# Patient Record
Sex: Male | Born: 1965 | Race: Black or African American | Hispanic: No | Marital: Single | Smoking: Former smoker
Health system: Southern US, Community
[De-identification: ages and names within clinical notes are randomized; demographics above are authoritative.]

## PROBLEM LIST (undated history)

## (undated) DIAGNOSIS — B370 Candidal stomatitis: Secondary | ICD-10-CM

## (undated) DIAGNOSIS — L988 Other specified disorders of the skin and subcutaneous tissue: Secondary | ICD-10-CM

## (undated) DIAGNOSIS — I10 Essential (primary) hypertension: Secondary | ICD-10-CM

## (undated) DIAGNOSIS — K219 Gastro-esophageal reflux disease without esophagitis: Secondary | ICD-10-CM

## (undated) DIAGNOSIS — L0292 Furuncle, unspecified: Secondary | ICD-10-CM

## (undated) DIAGNOSIS — Z21 Asymptomatic human immunodeficiency virus [HIV] infection status: Secondary | ICD-10-CM

## (undated) DIAGNOSIS — F419 Anxiety disorder, unspecified: Secondary | ICD-10-CM

## (undated) DIAGNOSIS — K220 Achalasia of cardia: Secondary | ICD-10-CM

## (undated) DIAGNOSIS — I2510 Atherosclerotic heart disease of native coronary artery without angina pectoris: Secondary | ICD-10-CM

## (undated) DIAGNOSIS — K8020 Calculus of gallbladder without cholecystitis without obstruction: Secondary | ICD-10-CM

## (undated) DIAGNOSIS — B20 Human immunodeficiency virus [HIV] disease: Secondary | ICD-10-CM

## (undated) DIAGNOSIS — L409 Psoriasis, unspecified: Secondary | ICD-10-CM

## (undated) DIAGNOSIS — B001 Herpesviral vesicular dermatitis: Secondary | ICD-10-CM

## (undated) DIAGNOSIS — C44529 Squamous cell carcinoma of skin of other part of trunk: Secondary | ICD-10-CM

## (undated) DIAGNOSIS — G4730 Sleep apnea, unspecified: Secondary | ICD-10-CM

## (undated) DIAGNOSIS — F1910 Other psychoactive substance abuse, uncomplicated: Secondary | ICD-10-CM

## (undated) DIAGNOSIS — M7500 Adhesive capsulitis of unspecified shoulder: Secondary | ICD-10-CM

## (undated) DIAGNOSIS — M1990 Unspecified osteoarthritis, unspecified site: Secondary | ICD-10-CM

## (undated) DIAGNOSIS — K759 Inflammatory liver disease, unspecified: Secondary | ICD-10-CM

## (undated) DIAGNOSIS — S42309A Unspecified fracture of shaft of humerus, unspecified arm, initial encounter for closed fracture: Secondary | ICD-10-CM

## (undated) DIAGNOSIS — F1410 Cocaine abuse, uncomplicated: Secondary | ICD-10-CM

## (undated) DIAGNOSIS — T7840XA Allergy, unspecified, initial encounter: Secondary | ICD-10-CM

## (undated) DIAGNOSIS — D0761 Carcinoma in situ of scrotum: Secondary | ICD-10-CM

## (undated) DIAGNOSIS — A630 Anogenital (venereal) warts: Secondary | ICD-10-CM

## (undated) DIAGNOSIS — I219 Acute myocardial infarction, unspecified: Secondary | ICD-10-CM

## (undated) DIAGNOSIS — B379 Candidiasis, unspecified: Secondary | ICD-10-CM

## (undated) HISTORY — PX: COLONOSCOPY: SHX174

## (undated) HISTORY — DX: Inflammatory liver disease, unspecified: K75.9

## (undated) HISTORY — DX: Psoriasis, unspecified: L40.9

## (undated) HISTORY — DX: Other psychoactive substance abuse, uncomplicated: F19.10

## (undated) HISTORY — DX: Furuncle, unspecified: L02.92

## (undated) HISTORY — DX: Acute myocardial infarction, unspecified: I21.9

## (undated) HISTORY — DX: Candidal stomatitis: B37.0

## (undated) HISTORY — DX: Other specified disorders of the skin and subcutaneous tissue: L98.8

## (undated) HISTORY — DX: Allergy, unspecified, initial encounter: T78.40XA

## (undated) HISTORY — DX: Asymptomatic human immunodeficiency virus (hiv) infection status: Z21

## (undated) HISTORY — DX: Gastro-esophageal reflux disease without esophagitis: K21.9

## (undated) HISTORY — DX: Candidiasis, unspecified: B37.9

## (undated) HISTORY — DX: Squamous cell carcinoma of skin of other part of trunk: C44.529

## (undated) HISTORY — DX: Unspecified fracture of shaft of humerus, unspecified arm, initial encounter for closed fracture: S42.309A

## (undated) HISTORY — DX: Adhesive capsulitis of unspecified shoulder: M75.00

## (undated) HISTORY — DX: Unspecified osteoarthritis, unspecified site: M19.90

## (undated) HISTORY — DX: Achalasia of cardia: K22.0

## (undated) HISTORY — DX: Anogenital (venereal) warts: A63.0

## (undated) HISTORY — DX: Human immunodeficiency virus (HIV) disease: B20

## (undated) HISTORY — PX: UPPER GASTROINTESTINAL ENDOSCOPY: SHX188

## (undated) HISTORY — DX: Herpesviral vesicular dermatitis: B00.1

## (undated) HISTORY — DX: Carcinoma in situ of scrotum: D07.61

## (undated) HISTORY — DX: Atherosclerotic heart disease of native coronary artery without angina pectoris: I25.10

---

## 1991-10-26 DIAGNOSIS — K759 Inflammatory liver disease, unspecified: Secondary | ICD-10-CM

## 1991-10-26 HISTORY — DX: Inflammatory liver disease, unspecified: K75.9

## 2004-04-22 ENCOUNTER — Emergency Department (HOSPITAL_COMMUNITY): Admission: EM | Admit: 2004-04-22 | Discharge: 2004-04-22 | Payer: Self-pay | Admitting: Family Medicine

## 2004-07-04 ENCOUNTER — Ambulatory Visit: Payer: Self-pay | Admitting: Infectious Diseases

## 2004-07-04 ENCOUNTER — Inpatient Hospital Stay (HOSPITAL_COMMUNITY): Admission: AC | Admit: 2004-07-04 | Discharge: 2004-07-06 | Payer: Self-pay

## 2004-07-23 ENCOUNTER — Encounter: Admission: RE | Admit: 2004-07-23 | Discharge: 2004-10-21 | Payer: Self-pay | Admitting: Orthopedic Surgery

## 2004-08-17 ENCOUNTER — Ambulatory Visit (HOSPITAL_COMMUNITY): Admission: RE | Admit: 2004-08-17 | Discharge: 2004-08-17 | Payer: Self-pay | Admitting: Infectious Diseases

## 2004-08-17 ENCOUNTER — Encounter (INDEPENDENT_AMBULATORY_CARE_PROVIDER_SITE_OTHER): Payer: Self-pay | Admitting: *Deleted

## 2004-08-17 ENCOUNTER — Ambulatory Visit: Payer: Self-pay | Admitting: Infectious Diseases

## 2004-08-17 LAB — CONVERTED CEMR LAB: CD4 T Cell Abs: 160

## 2004-09-24 ENCOUNTER — Ambulatory Visit: Payer: Self-pay | Admitting: Infectious Diseases

## 2004-10-22 ENCOUNTER — Encounter: Admission: RE | Admit: 2004-10-22 | Discharge: 2004-11-16 | Payer: Self-pay | Admitting: Orthopedic Surgery

## 2005-01-05 ENCOUNTER — Encounter: Admission: RE | Admit: 2005-01-05 | Discharge: 2005-04-05 | Payer: Self-pay | Admitting: Infectious Diseases

## 2005-04-06 ENCOUNTER — Encounter: Admission: RE | Admit: 2005-04-06 | Discharge: 2005-04-08 | Payer: Self-pay | Admitting: Infectious Diseases

## 2005-07-06 ENCOUNTER — Emergency Department (HOSPITAL_COMMUNITY): Admission: EM | Admit: 2005-07-06 | Discharge: 2005-07-06 | Payer: Self-pay | Admitting: Family Medicine

## 2005-08-04 ENCOUNTER — Ambulatory Visit (HOSPITAL_COMMUNITY): Admission: RE | Admit: 2005-08-04 | Discharge: 2005-08-04 | Payer: Self-pay | Admitting: Infectious Diseases

## 2005-08-04 ENCOUNTER — Ambulatory Visit: Payer: Self-pay | Admitting: Infectious Diseases

## 2005-08-23 ENCOUNTER — Ambulatory Visit: Payer: Self-pay | Admitting: Infectious Diseases

## 2005-09-09 ENCOUNTER — Emergency Department (HOSPITAL_COMMUNITY): Admission: EM | Admit: 2005-09-09 | Discharge: 2005-09-09 | Payer: Self-pay | Admitting: Family Medicine

## 2005-10-12 ENCOUNTER — Ambulatory Visit: Payer: Self-pay | Admitting: Infectious Diseases

## 2005-12-08 ENCOUNTER — Encounter (INDEPENDENT_AMBULATORY_CARE_PROVIDER_SITE_OTHER): Payer: Self-pay | Admitting: *Deleted

## 2005-12-08 ENCOUNTER — Ambulatory Visit: Payer: Self-pay | Admitting: Infectious Diseases

## 2006-03-07 ENCOUNTER — Ambulatory Visit: Payer: Self-pay | Admitting: Infectious Diseases

## 2006-03-08 ENCOUNTER — Emergency Department (HOSPITAL_COMMUNITY): Admission: EM | Admit: 2006-03-08 | Discharge: 2006-03-08 | Payer: Self-pay | Admitting: Family Medicine

## 2006-03-16 ENCOUNTER — Encounter: Admission: RE | Admit: 2006-03-16 | Discharge: 2006-03-16 | Payer: Self-pay | Admitting: Orthopedic Surgery

## 2006-04-19 ENCOUNTER — Emergency Department (HOSPITAL_COMMUNITY): Admission: EM | Admit: 2006-04-19 | Discharge: 2006-04-19 | Payer: Self-pay | Admitting: Family Medicine

## 2006-05-30 ENCOUNTER — Ambulatory Visit: Payer: Self-pay | Admitting: Infectious Diseases

## 2006-05-30 ENCOUNTER — Encounter: Admission: RE | Admit: 2006-05-30 | Discharge: 2006-05-30 | Payer: Self-pay | Admitting: Internal Medicine

## 2006-05-30 ENCOUNTER — Encounter (INDEPENDENT_AMBULATORY_CARE_PROVIDER_SITE_OTHER): Payer: Self-pay | Admitting: *Deleted

## 2006-05-30 LAB — CONVERTED CEMR LAB: CD4 Count: 240 microliters

## 2006-06-23 ENCOUNTER — Ambulatory Visit: Payer: Self-pay | Admitting: Physical Medicine & Rehabilitation

## 2006-06-23 ENCOUNTER — Encounter
Admission: RE | Admit: 2006-06-23 | Discharge: 2006-09-21 | Payer: Self-pay | Admitting: Physical Medicine & Rehabilitation

## 2006-07-14 ENCOUNTER — Emergency Department (HOSPITAL_COMMUNITY): Admission: EM | Admit: 2006-07-14 | Discharge: 2006-07-14 | Payer: Self-pay | Admitting: Family Medicine

## 2006-08-22 ENCOUNTER — Encounter (INDEPENDENT_AMBULATORY_CARE_PROVIDER_SITE_OTHER): Payer: Self-pay | Admitting: *Deleted

## 2006-08-22 ENCOUNTER — Ambulatory Visit: Payer: Self-pay | Admitting: Infectious Diseases

## 2006-08-22 ENCOUNTER — Encounter: Admission: RE | Admit: 2006-08-22 | Discharge: 2006-08-22 | Payer: Self-pay | Admitting: Infectious Diseases

## 2006-08-22 LAB — CONVERTED CEMR LAB
Alkaline Phosphatase: 66 units/L (ref 39–117)
Basophils percent auto: 0 % (ref 0–1)
Bilirubin Urine: NEGATIVE
CO2: 25 meq/L (ref 19–32)
Calcium: 8.6 mg/dL (ref 8.4–10.5)
Chloride: 104 meq/L (ref 96–112)
Cocaine Metabolites: POSITIVE — AB
Creatinine, Ser: 0.95 mg/dL (ref 0.40–1.50)
Creatinine,U: 347 mg/dL
Eosinophils Absolute: 0.1 cells/mcL (ref 0.0–0.7)
Eosinophils Relative: 2 % (ref 0–5)
HCT: 42.5 % (ref 39.0–52.0)
HIV-1 RNA Quant, Log: 3.13 — ABNORMAL HIGH (ref <1.70)
Hemoglobin: 14.5 g/dL (ref 13.0–17.0)
Hgb urine dipstick: NEGATIVE
Lymphocytes Relative: 50 % — ABNORMAL HIGH (ref 12–46)
Lymphs Abs: 3.5 10*3/uL — ABNORMAL HIGH (ref 0.7–3.3)
Methadone: NEGATIVE
Monocytes Absolute: 0.5 10*3/uL (ref 0.2–0.7)
Monocytes Relative: 7 % (ref 3–11)
Neutro Abs: 2.8 10*3/uL (ref 1.7–7.7)
Opiate Screen, Urine: NEGATIVE
Platelets: 175 10*3/uL (ref 150–400)
Protein, ur: 30 mg/dL — AB
RBC / HPF: NONE SEEN (ref <3)
RDW: 16.3 % — ABNORMAL HIGH (ref 11.5–14.0)
Sodium: 142 meq/L (ref 135–145)
Total Bilirubin: 0.4 mg/dL (ref 0.3–1.2)
Total Protein: 7.2 g/dL (ref 6.0–8.3)

## 2006-09-03 DIAGNOSIS — S42309A Unspecified fracture of shaft of humerus, unspecified arm, initial encounter for closed fracture: Secondary | ICD-10-CM

## 2006-09-03 DIAGNOSIS — B20 Human immunodeficiency virus [HIV] disease: Secondary | ICD-10-CM | POA: Insufficient documentation

## 2006-10-25 HISTORY — PX: OTHER SURGICAL HISTORY: SHX169

## 2006-11-30 ENCOUNTER — Encounter (INDEPENDENT_AMBULATORY_CARE_PROVIDER_SITE_OTHER): Payer: Self-pay | Admitting: *Deleted

## 2006-11-30 ENCOUNTER — Ambulatory Visit: Payer: Self-pay | Admitting: Infectious Diseases

## 2006-11-30 ENCOUNTER — Encounter: Admission: RE | Admit: 2006-11-30 | Discharge: 2006-11-30 | Payer: Self-pay | Admitting: Infectious Diseases

## 2006-11-30 LAB — CONVERTED CEMR LAB
AST: 16 units/L (ref 0–37)
Alkaline Phosphatase: 76 units/L (ref 39–117)
Basophils Relative: 1 % (ref 0–1)
CD4 Count: 280 microliters
CO2: 28 meq/L (ref 19–32)
Creatinine, Ser: 0.91 mg/dL (ref 0.40–1.50)
Eosinophils Absolute: 0.3 10*3/uL (ref 0.0–0.7)
Glucose, Bld: 72 mg/dL (ref 70–99)
HCT: 41 % (ref 39.0–52.0)
HDL: 30 mg/dL — ABNORMAL LOW (ref >39)
HIV 1 RNA Quant: 381 copies/mL
Hemoglobin: 13.6 g/dL (ref 13.0–17.0)
LDL Cholesterol: 103 mg/dL — ABNORMAL HIGH (ref 0–99)
Lymphocytes Relative: 45 % (ref 12–46)
Lymphs Abs: 2.9 10*3/uL (ref 0.7–3.3)
MCHC: 33.2 g/dL (ref 30.0–36.0)
MCV: 78.5 fL (ref 78.0–100.0)
Neutrophils Relative %: 40 % — ABNORMAL LOW (ref 43–77)
Platelets: 184 10*3/uL (ref 150–400)
RBC: 5.22 M/uL (ref 4.22–5.81)
Total CHOL/HDL Ratio: 5.9
Triglycerides: 225 mg/dL — ABNORMAL HIGH (ref <150)
VLDL: 45 mg/dL — ABNORMAL HIGH (ref 0–40)

## 2006-12-05 ENCOUNTER — Encounter (INDEPENDENT_AMBULATORY_CARE_PROVIDER_SITE_OTHER): Payer: Self-pay | Admitting: Infectious Diseases

## 2006-12-05 ENCOUNTER — Ambulatory Visit: Payer: Self-pay | Admitting: Gastroenterology

## 2006-12-07 ENCOUNTER — Encounter (INDEPENDENT_AMBULATORY_CARE_PROVIDER_SITE_OTHER): Payer: Self-pay | Admitting: Infectious Diseases

## 2006-12-07 ENCOUNTER — Ambulatory Visit: Payer: Self-pay | Admitting: Gastroenterology

## 2006-12-07 ENCOUNTER — Encounter (INDEPENDENT_AMBULATORY_CARE_PROVIDER_SITE_OTHER): Payer: Self-pay | Admitting: *Deleted

## 2006-12-19 ENCOUNTER — Encounter (INDEPENDENT_AMBULATORY_CARE_PROVIDER_SITE_OTHER): Payer: Self-pay | Admitting: *Deleted

## 2006-12-19 LAB — CONVERTED CEMR LAB

## 2007-01-01 ENCOUNTER — Encounter (INDEPENDENT_AMBULATORY_CARE_PROVIDER_SITE_OTHER): Payer: Self-pay | Admitting: *Deleted

## 2007-01-18 ENCOUNTER — Ambulatory Visit: Payer: Self-pay | Admitting: Infectious Diseases

## 2007-01-31 ENCOUNTER — Emergency Department (HOSPITAL_COMMUNITY): Admission: EM | Admit: 2007-01-31 | Discharge: 2007-01-31 | Payer: Self-pay | Admitting: Family Medicine

## 2007-02-03 ENCOUNTER — Encounter: Admission: RE | Admit: 2007-02-03 | Discharge: 2007-05-04 | Payer: Self-pay | Admitting: Orthopedic Surgery

## 2007-04-11 ENCOUNTER — Encounter (INDEPENDENT_AMBULATORY_CARE_PROVIDER_SITE_OTHER): Payer: Self-pay | Admitting: *Deleted

## 2007-05-05 ENCOUNTER — Encounter: Admission: RE | Admit: 2007-05-05 | Discharge: 2007-08-03 | Payer: Self-pay | Admitting: Orthopedic Surgery

## 2007-06-08 ENCOUNTER — Encounter: Admission: RE | Admit: 2007-06-08 | Discharge: 2007-06-23 | Payer: Self-pay | Admitting: Orthopedic Surgery

## 2008-03-05 ENCOUNTER — Emergency Department (HOSPITAL_COMMUNITY): Admission: EM | Admit: 2008-03-05 | Discharge: 2008-03-05 | Payer: Self-pay | Admitting: Emergency Medicine

## 2008-03-14 ENCOUNTER — Telehealth: Payer: Self-pay | Admitting: Infectious Disease

## 2008-04-05 ENCOUNTER — Emergency Department (HOSPITAL_COMMUNITY): Admission: EM | Admit: 2008-04-05 | Discharge: 2008-04-05 | Payer: Self-pay | Admitting: Family Medicine

## 2008-04-14 ENCOUNTER — Inpatient Hospital Stay (HOSPITAL_COMMUNITY): Admission: EM | Admit: 2008-04-14 | Discharge: 2008-04-16 | Payer: Self-pay | Admitting: Emergency Medicine

## 2008-04-27 ENCOUNTER — Emergency Department (HOSPITAL_COMMUNITY): Admission: EM | Admit: 2008-04-27 | Discharge: 2008-04-27 | Payer: Self-pay | Admitting: Emergency Medicine

## 2008-04-30 ENCOUNTER — Encounter: Admission: RE | Admit: 2008-04-30 | Discharge: 2008-04-30 | Payer: Self-pay | Admitting: Infectious Diseases

## 2008-04-30 ENCOUNTER — Ambulatory Visit: Payer: Self-pay | Admitting: Infectious Diseases

## 2008-04-30 LAB — CONVERTED CEMR LAB
Alkaline Phosphatase: 56 units/L (ref 39–117)
BUN: 9 mg/dL (ref 6–23)
CO2: 27 meq/L (ref 19–32)
Cholesterol: 147 mg/dL (ref 0–200)
Creatinine, Ser: 0.86 mg/dL (ref 0.40–1.50)
Eosinophils Absolute: 0.2 10*3/uL (ref 0.0–0.7)
Eosinophils Relative: 4 % (ref 0–5)
Glucose, Bld: 86 mg/dL (ref 70–99)
HCT: 37 % — ABNORMAL LOW (ref 39.0–52.0)
HDL: 26 mg/dL — ABNORMAL LOW (ref >39)
HIV-1 RNA Quant, Log: 4.31 — ABNORMAL HIGH (ref <1.70)
Hemoglobin: 12.1 g/dL — ABNORMAL LOW (ref 13.0–17.0)
Lymphocytes Relative: 57 % — ABNORMAL HIGH (ref 12–46)
Lymphs Abs: 2.7 10*3/uL (ref 0.7–4.0)
MCV: 77.2 fL — ABNORMAL LOW (ref 78.0–100.0)
Monocytes Absolute: 0.4 10*3/uL (ref 0.1–1.0)
Total Bilirubin: 0.4 mg/dL (ref 0.3–1.2)
Total CHOL/HDL Ratio: 5.7
Triglycerides: 87 mg/dL (ref <150)
VLDL: 17 mg/dL (ref 0–40)
WBC: 4.7 10*3/uL (ref 4.0–10.5)

## 2008-05-16 ENCOUNTER — Ambulatory Visit: Payer: Self-pay | Admitting: Infectious Diseases

## 2008-05-16 DIAGNOSIS — F1290 Cannabis use, unspecified, uncomplicated: Secondary | ICD-10-CM

## 2008-05-16 DIAGNOSIS — J45909 Unspecified asthma, uncomplicated: Secondary | ICD-10-CM

## 2008-05-16 DIAGNOSIS — B370 Candidal stomatitis: Secondary | ICD-10-CM | POA: Insufficient documentation

## 2008-08-15 ENCOUNTER — Emergency Department (HOSPITAL_COMMUNITY): Admission: EM | Admit: 2008-08-15 | Discharge: 2008-08-15 | Payer: Self-pay | Admitting: Adult Health

## 2008-08-15 ENCOUNTER — Ambulatory Visit: Payer: Self-pay | Admitting: Infectious Diseases

## 2008-08-15 ENCOUNTER — Telehealth: Payer: Self-pay | Admitting: Infectious Diseases

## 2008-11-06 ENCOUNTER — Encounter: Admission: RE | Admit: 2008-11-06 | Discharge: 2009-02-04 | Payer: Self-pay | Admitting: Orthopedic Surgery

## 2008-11-21 ENCOUNTER — Emergency Department (HOSPITAL_COMMUNITY): Admission: EM | Admit: 2008-11-21 | Discharge: 2008-11-21 | Payer: Self-pay | Admitting: Emergency Medicine

## 2009-01-30 ENCOUNTER — Ambulatory Visit: Payer: Self-pay | Admitting: Infectious Diseases

## 2009-01-30 LAB — CONVERTED CEMR LAB
Alkaline Phosphatase: 84 units/L (ref 39–117)
Basophils Absolute: 0 10*3/uL (ref 0.0–0.1)
Cholesterol: 142 mg/dL (ref 0–200)
Creatinine, Ser: 1.09 mg/dL (ref 0.40–1.50)
Eosinophils Relative: 4 % (ref 0–5)
GFR calc non Af Amer: >60 mL/min (ref >60)
Glucose, Bld: 85 mg/dL (ref 70–99)
HCT: 42.5 % (ref 39.0–52.0)
HDL: 29 mg/dL — ABNORMAL LOW (ref >39)
Hemoglobin: 14.2 g/dL (ref 13.0–17.0)
LDL Cholesterol: 81 mg/dL (ref 0–99)
Lymphocytes Relative: 63 % — ABNORMAL HIGH (ref 12–46)
Lymphs Abs: 2.9 10*3/uL (ref 0.7–4.0)
Monocytes Absolute: 0.4 10*3/uL (ref 0.1–1.0)
Monocytes Relative: 9 % (ref 3–12)
RBC: 5.64 M/uL (ref 4.22–5.81)
RDW: 16.1 % — ABNORMAL HIGH (ref 11.5–15.5)
Sodium: 143 meq/L (ref 135–145)
Total Bilirubin: 0.2 mg/dL — ABNORMAL LOW (ref 0.3–1.2)
Total CHOL/HDL Ratio: 4.9
Total Protein: 7.4 g/dL (ref 6.0–8.3)
Triglycerides: 162 mg/dL — ABNORMAL HIGH (ref <150)
VLDL: 32 mg/dL (ref 0–40)

## 2009-02-25 ENCOUNTER — Ambulatory Visit: Payer: Self-pay | Admitting: Infectious Diseases

## 2009-02-25 DIAGNOSIS — R1310 Dysphagia, unspecified: Secondary | ICD-10-CM | POA: Insufficient documentation

## 2009-02-28 ENCOUNTER — Ambulatory Visit (HOSPITAL_COMMUNITY): Admission: RE | Admit: 2009-02-28 | Discharge: 2009-02-28 | Payer: Self-pay | Admitting: Infectious Diseases

## 2009-02-28 ENCOUNTER — Telehealth: Payer: Self-pay

## 2009-03-05 ENCOUNTER — Telehealth: Payer: Self-pay | Admitting: Infectious Diseases

## 2009-03-10 ENCOUNTER — Telehealth: Payer: Self-pay

## 2009-04-18 ENCOUNTER — Ambulatory Visit: Payer: Self-pay | Admitting: Gastroenterology

## 2009-04-29 ENCOUNTER — Ambulatory Visit: Payer: Self-pay | Admitting: Infectious Diseases

## 2009-04-29 LAB — CONVERTED CEMR LAB
Albumin: 4.1 g/dL (ref 3.5–5.2)
BUN: 10 mg/dL (ref 6–23)
Basophils Absolute: 0 10*3/uL (ref 0.0–0.1)
CO2: 26 meq/L (ref 19–32)
Calcium: 8.9 mg/dL (ref 8.4–10.5)
Chloride: 105 meq/L (ref 96–112)
Creatinine, Ser: 1.05 mg/dL (ref 0.40–1.50)
Eosinophils Relative: 9 % — ABNORMAL HIGH (ref 0–5)
HCT: 38.7 % — ABNORMAL LOW (ref 39.0–52.0)
HIV 1 RNA Quant: 22500 copies/mL — ABNORMAL HIGH (ref <48)
HIV-1 RNA Quant, Log: 4.35 — ABNORMAL HIGH (ref <1.68)
Hemoglobin: 12.6 g/dL — ABNORMAL LOW (ref 13.0–17.0)
Lymphocytes Relative: 52 % — ABNORMAL HIGH (ref 12–46)
Lymphs Abs: 2.4 10*3/uL (ref 0.7–4.0)
Monocytes Absolute: 0.5 10*3/uL (ref 0.1–1.0)
Neutro Abs: 1.3 10*3/uL — ABNORMAL LOW (ref 1.7–7.7)
Platelets: 182 10*3/uL (ref 150–400)
Potassium: 3.7 meq/L (ref 3.5–5.3)
RDW: 17.8 % — ABNORMAL HIGH (ref 11.5–15.5)
WBC: 4.6 10*3/uL (ref 4.0–10.5)

## 2009-05-12 ENCOUNTER — Telehealth: Payer: Self-pay | Admitting: Gastroenterology

## 2009-05-14 ENCOUNTER — Encounter: Payer: Self-pay | Admitting: Gastroenterology

## 2009-05-15 ENCOUNTER — Ambulatory Visit: Payer: Self-pay | Admitting: Infectious Diseases

## 2009-06-16 ENCOUNTER — Ambulatory Visit: Payer: Self-pay | Admitting: Gastroenterology

## 2009-06-16 ENCOUNTER — Ambulatory Visit (HOSPITAL_COMMUNITY): Admission: RE | Admit: 2009-06-16 | Discharge: 2009-06-16 | Payer: Self-pay | Admitting: Gastroenterology

## 2009-06-26 ENCOUNTER — Ambulatory Visit: Payer: Self-pay | Admitting: Infectious Diseases

## 2009-06-26 ENCOUNTER — Telehealth: Payer: Self-pay | Admitting: Gastroenterology

## 2009-06-26 DIAGNOSIS — B3789 Other sites of candidiasis: Secondary | ICD-10-CM | POA: Insufficient documentation

## 2009-06-26 LAB — CONVERTED CEMR LAB: Chlamydia, Swab/Urine, PCR: NEGATIVE

## 2009-07-01 ENCOUNTER — Encounter: Payer: Self-pay | Admitting: Gastroenterology

## 2009-07-01 ENCOUNTER — Telehealth: Payer: Self-pay | Admitting: Gastroenterology

## 2009-07-03 ENCOUNTER — Encounter: Payer: Self-pay | Admitting: Gastroenterology

## 2009-07-03 ENCOUNTER — Encounter: Payer: Self-pay | Admitting: Infectious Diseases

## 2009-07-03 ENCOUNTER — Telehealth: Payer: Self-pay | Admitting: Gastroenterology

## 2009-07-14 ENCOUNTER — Ambulatory Visit: Payer: Self-pay | Admitting: Gastroenterology

## 2009-07-14 ENCOUNTER — Ambulatory Visit (HOSPITAL_COMMUNITY): Admission: RE | Admit: 2009-07-14 | Discharge: 2009-07-14 | Payer: Self-pay | Admitting: Gastroenterology

## 2009-07-21 ENCOUNTER — Telehealth: Payer: Self-pay | Admitting: Gastroenterology

## 2009-08-06 ENCOUNTER — Telehealth: Payer: Self-pay

## 2009-08-12 ENCOUNTER — Ambulatory Visit: Payer: Self-pay | Admitting: Infectious Diseases

## 2009-08-12 LAB — CONVERTED CEMR LAB
AST: 18 units/L (ref 0–37)
Albumin: 4.1 g/dL (ref 3.5–5.2)
Alkaline Phosphatase: 67 units/L (ref 39–117)
BUN: 10 mg/dL (ref 6–23)
Basophils Absolute: 0 10*3/uL (ref 0.0–0.1)
Basophils Relative: 0 % (ref 0–1)
Eosinophils Absolute: 0.1 10*3/uL (ref 0.0–0.7)
Eosinophils Relative: 2 % (ref 0–5)
Glucose, Bld: 87 mg/dL (ref 70–99)
HCT: 40.6 % (ref 39.0–52.0)
HIV 1 RNA Quant: 30300 copies/mL — ABNORMAL HIGH (ref <48)
Hemoglobin: 13.2 g/dL (ref 13.0–17.0)
MCHC: 32.5 g/dL (ref 30.0–36.0)
MCV: 78.5 fL (ref >=78.0)
Monocytes Absolute: 0.5 10*3/uL (ref 0.1–1.0)
Monocytes Relative: 8 % (ref 3–12)
Potassium: 3.8 meq/L (ref 3.5–5.3)
RBC: 5.17 M/uL (ref 4.22–5.81)
RDW: 15.5 % (ref 11.5–15.5)
Total Bilirubin: 0.4 mg/dL (ref 0.3–1.2)

## 2009-08-14 ENCOUNTER — Ambulatory Visit: Payer: Self-pay | Admitting: Gastroenterology

## 2009-08-14 DIAGNOSIS — K220 Achalasia of cardia: Secondary | ICD-10-CM

## 2009-08-25 ENCOUNTER — Encounter: Payer: Self-pay | Admitting: Gastroenterology

## 2009-08-25 ENCOUNTER — Encounter: Payer: Self-pay | Admitting: Infectious Diseases

## 2009-08-25 ENCOUNTER — Ambulatory Visit (HOSPITAL_COMMUNITY): Admission: RE | Admit: 2009-08-25 | Discharge: 2009-08-25 | Payer: Self-pay | Admitting: Gastroenterology

## 2009-08-28 ENCOUNTER — Ambulatory Visit: Payer: Self-pay | Admitting: Gastroenterology

## 2009-08-29 ENCOUNTER — Telehealth: Payer: Self-pay | Admitting: Infectious Diseases

## 2009-09-04 ENCOUNTER — Encounter: Payer: Self-pay | Admitting: Gastroenterology

## 2009-10-04 ENCOUNTER — Emergency Department (HOSPITAL_COMMUNITY): Admission: EM | Admit: 2009-10-04 | Discharge: 2009-10-04 | Payer: Self-pay | Admitting: Family Medicine

## 2009-10-09 ENCOUNTER — Emergency Department (HOSPITAL_COMMUNITY): Admission: EM | Admit: 2009-10-09 | Discharge: 2009-10-09 | Payer: Self-pay | Admitting: Emergency Medicine

## 2009-11-19 ENCOUNTER — Encounter: Payer: Self-pay | Admitting: Gastroenterology

## 2009-11-19 ENCOUNTER — Encounter: Payer: Self-pay | Admitting: Infectious Diseases

## 2009-11-20 ENCOUNTER — Ambulatory Visit: Payer: Self-pay | Admitting: Infectious Diseases

## 2009-11-20 DIAGNOSIS — L408 Other psoriasis: Secondary | ICD-10-CM

## 2009-11-20 LAB — CONVERTED CEMR LAB
Albumin: 4.2 g/dL (ref 3.5–5.2)
BUN: 15 mg/dL (ref 6–23)
Basophils Relative: 0 % (ref 0–1)
CO2: 29 meq/L (ref 19–32)
Eosinophils Relative: 4 % (ref 0–5)
Glucose, Bld: 77 mg/dL (ref 70–99)
HCT: 41.4 % (ref 39.0–52.0)
HIV-1 RNA Quant, Log: 4.36 — ABNORMAL HIGH (ref <1.68)
Hemoglobin: 13.4 g/dL (ref 13.0–17.0)
MCHC: 32.4 g/dL (ref 30.0–36.0)
MCV: 79.5 fL (ref >=78.0)
Monocytes Absolute: 0.3 10*3/uL (ref 0.1–1.0)
Monocytes Relative: 7 % (ref 3–12)
Neutro Abs: 1.2 10*3/uL — ABNORMAL LOW (ref 1.7–7.7)
Potassium: 3.9 meq/L (ref 3.5–5.3)
RBC: 5.21 M/uL (ref 4.22–5.81)
RDW: 16.4 % — ABNORMAL HIGH (ref 11.5–15.5)
Sodium: 141 meq/L (ref 135–145)
Total Bilirubin: 0.3 mg/dL (ref 0.3–1.2)
Total Protein: 7.4 g/dL (ref 6.0–8.3)

## 2009-12-08 ENCOUNTER — Encounter: Payer: Self-pay | Admitting: Infectious Diseases

## 2009-12-15 ENCOUNTER — Telehealth: Payer: Self-pay | Admitting: Infectious Diseases

## 2009-12-15 ENCOUNTER — Encounter: Payer: Self-pay | Admitting: Infectious Diseases

## 2009-12-30 ENCOUNTER — Ambulatory Visit: Payer: Self-pay | Admitting: Infectious Diseases

## 2009-12-30 DIAGNOSIS — B009 Herpesviral infection, unspecified: Secondary | ICD-10-CM | POA: Insufficient documentation

## 2009-12-30 LAB — CONVERTED CEMR LAB
AST: 13 units/L (ref 0–37)
Albumin: 4.2 g/dL (ref 3.5–5.2)
BUN: 13 mg/dL (ref 6–23)
Calcium: 9.5 mg/dL (ref 8.4–10.5)
Chloride: 107 meq/L (ref 96–112)
Eosinophils Relative: 4 % (ref 0–5)
Glucose, Bld: 84 mg/dL (ref 70–99)
HCT: 38 % — ABNORMAL LOW (ref 39.0–52.0)
Hemoglobin: 12.3 g/dL — ABNORMAL LOW (ref 13.0–17.0)
Lymphocytes Relative: 51 % — ABNORMAL HIGH (ref 12–46)
Lymphs Abs: 2.9 10*3/uL (ref 0.7–4.0)
Monocytes Absolute: 0.5 10*3/uL (ref 0.1–1.0)
Monocytes Relative: 8 % (ref 3–12)
Potassium: 3.6 meq/L (ref 3.5–5.3)
RBC: 4.87 M/uL (ref 4.22–5.81)
WBC: 5.6 10*3/uL (ref 4.0–10.5)

## 2009-12-31 ENCOUNTER — Encounter: Payer: Self-pay | Admitting: Gastroenterology

## 2009-12-31 ENCOUNTER — Ambulatory Visit (HOSPITAL_COMMUNITY): Admission: RE | Admit: 2009-12-31 | Discharge: 2009-12-31 | Payer: Self-pay | Admitting: Infectious Diseases

## 2010-03-06 ENCOUNTER — Telehealth: Payer: Self-pay | Admitting: Infectious Diseases

## 2010-03-11 ENCOUNTER — Ambulatory Visit: Payer: Self-pay | Admitting: Infectious Diseases

## 2010-03-25 ENCOUNTER — Encounter: Payer: Self-pay | Admitting: Infectious Diseases

## 2010-04-14 ENCOUNTER — Telehealth (INDEPENDENT_AMBULATORY_CARE_PROVIDER_SITE_OTHER): Payer: Self-pay | Admitting: Licensed Clinical Social Worker

## 2010-05-14 ENCOUNTER — Telehealth: Payer: Self-pay | Admitting: Infectious Diseases

## 2010-06-15 ENCOUNTER — Ambulatory Visit: Payer: Self-pay | Admitting: Infectious Diseases

## 2010-07-20 ENCOUNTER — Emergency Department (HOSPITAL_COMMUNITY): Admission: EM | Admit: 2010-07-20 | Discharge: 2010-07-20 | Payer: Self-pay | Admitting: Emergency Medicine

## 2010-07-31 ENCOUNTER — Encounter: Payer: Self-pay | Admitting: Gastroenterology

## 2010-07-31 ENCOUNTER — Telehealth: Payer: Self-pay | Admitting: Gastroenterology

## 2010-08-13 ENCOUNTER — Telehealth: Payer: Self-pay | Admitting: Infectious Diseases

## 2010-08-24 ENCOUNTER — Ambulatory Visit (HOSPITAL_COMMUNITY)
Admission: RE | Admit: 2010-08-24 | Discharge: 2010-08-24 | Payer: Self-pay | Source: Home / Self Care | Admitting: Gastroenterology

## 2010-08-24 ENCOUNTER — Ambulatory Visit: Payer: Self-pay | Admitting: Gastroenterology

## 2010-08-31 ENCOUNTER — Ambulatory Visit: Payer: Self-pay | Admitting: Infectious Diseases

## 2010-08-31 DIAGNOSIS — I10 Essential (primary) hypertension: Secondary | ICD-10-CM | POA: Insufficient documentation

## 2010-09-14 ENCOUNTER — Encounter (INDEPENDENT_AMBULATORY_CARE_PROVIDER_SITE_OTHER): Payer: Self-pay | Admitting: *Deleted

## 2010-09-14 ENCOUNTER — Ambulatory Visit: Payer: Self-pay | Admitting: Infectious Diseases

## 2010-09-23 ENCOUNTER — Encounter: Payer: Self-pay | Admitting: Infectious Diseases

## 2010-09-25 ENCOUNTER — Ambulatory Visit: Payer: Self-pay | Admitting: Gastroenterology

## 2010-09-25 DIAGNOSIS — R1013 Epigastric pain: Secondary | ICD-10-CM

## 2010-09-30 ENCOUNTER — Telehealth: Payer: Self-pay | Admitting: Gastroenterology

## 2010-09-30 ENCOUNTER — Encounter: Payer: Self-pay | Admitting: Infectious Diseases

## 2010-09-30 ENCOUNTER — Ambulatory Visit: Payer: Self-pay | Admitting: Infectious Diseases

## 2010-09-30 LAB — CONVERTED CEMR LAB
ALT: 12 units/L (ref 0–53)
AST: 14 units/L (ref 0–37)
Basophils Absolute: 0 10*3/uL (ref 0.0–0.1)
Calcium: 9.2 mg/dL (ref 8.4–10.5)
Chloride: 107 meq/L (ref 96–112)
Creatinine, Ser: 1.16 mg/dL (ref 0.40–1.50)
Eosinophils Absolute: 0.2 10*3/uL (ref 0.0–0.7)
Eosinophils Relative: 4 % (ref 0–5)
HCT: 37.8 % — ABNORMAL LOW (ref 39.0–52.0)
HIV 1 RNA Quant: 90 copies/mL — ABNORMAL HIGH (ref <20)
Lymphocytes Relative: 50 % — ABNORMAL HIGH (ref 12–46)
Neutrophils Relative %: 36 % — ABNORMAL LOW (ref 43–77)
Platelets: 202 10*3/uL (ref 150–400)
Potassium: 3.8 meq/L (ref 3.5–5.3)
RDW: 16.4 % — ABNORMAL HIGH (ref 11.5–15.5)
Sodium: 142 meq/L (ref 135–145)
Total Protein: 7 g/dL (ref 6.0–8.3)

## 2010-10-01 ENCOUNTER — Telehealth (INDEPENDENT_AMBULATORY_CARE_PROVIDER_SITE_OTHER): Payer: Self-pay | Admitting: *Deleted

## 2010-10-09 ENCOUNTER — Telehealth: Payer: Self-pay | Admitting: Gastroenterology

## 2010-11-11 ENCOUNTER — Encounter
Admission: RE | Admit: 2010-11-11 | Discharge: 2010-11-16 | Payer: Self-pay | Source: Home / Self Care | Attending: Physical Medicine & Rehabilitation | Admitting: Physical Medicine & Rehabilitation

## 2010-11-15 ENCOUNTER — Encounter: Payer: Self-pay | Admitting: Infectious Diseases

## 2010-11-16 ENCOUNTER — Ambulatory Visit
Admission: RE | Admit: 2010-11-16 | Discharge: 2010-11-16 | Payer: Self-pay | Source: Home / Self Care | Attending: Physical Medicine & Rehabilitation | Admitting: Physical Medicine & Rehabilitation

## 2010-11-22 LAB — CONVERTED CEMR LAB
ALT: 9 units/L (ref 0–53)
Basophils Absolute: 0 10*3/uL (ref 0.0–0.1)
CO2: 24 meq/L (ref 19–32)
Creatinine, Ser: 0.88 mg/dL (ref 0.40–1.50)
Eosinophils Absolute: 0.3 10*3/uL (ref 0.0–0.7)
Eosinophils Relative: 4 % (ref 0–5)
HCT: 40.2 % (ref 39.0–52.0)
HIV-1 RNA Quant, Log: 3.39 — ABNORMAL HIGH (ref <1.70)
Hemoglobin: 13.2 g/dL (ref 13.0–17.0)
Lymphocytes Relative: 51 % — ABNORMAL HIGH (ref 12–46)
MCHC: 32.8 g/dL (ref 30.0–36.0)
MCV: 76.1 fL — ABNORMAL LOW (ref 78.0–100.0)
Monocytes Absolute: 0.6 10*3/uL (ref 0.1–1.0)
RDW: 16.6 % — ABNORMAL HIGH (ref 11.5–15.5)
Total Bilirubin: 0.4 mg/dL (ref 0.3–1.2)

## 2010-11-25 NOTE — Consult Note (Signed)
Summary: [facility]: EGD  [facility]: EGD   Imported By: Florinda Marker 12/09/2009 15:29:11  _____________________________________________________________________  External Attachment:    Type:   Image     Comment:   External Document

## 2010-11-25 NOTE — Consult Note (Signed)
Summary: [facility]:GI Clinic Notes  [facility]:GI Clinic Notes   Imported By: Florinda Marker 12/04/2009 15:43:10  _____________________________________________________________________  External Attachment:    Type:   Image     Comment:   External Document

## 2010-11-25 NOTE — Progress Notes (Signed)
Summary: Not taking Reyatz correctly.   Phone Note Call from Patient   Summary of Call: Patient states he has been taking the Reyataz twice daily for several months. He just realized it was suppose to be daily. He state he feels well and has no abnormal symptoms. He wanted [place] to know he was taking the medication incorrectly and will start it daily on tomorrow.(Dec 16, 2009) Tomasita Morrow RN  December 15, 2009 4:13 PM  Initial call taken by: Tomasita Morrow RN,  December 15, 2009 4:13 PM

## 2010-11-25 NOTE — Assessment & Plan Note (Signed)
Summary: 6 WEEK RECHECK/CH   Referring Provider:  Clydie Braun MD Primary Provider:  Clydie Braun MD  CC:  6 week check up / right shoulder is painful.  History of Present Illness: 45 yo AAM with HIV who is intermittent in his followup and med adherence.  At last visit started reyataz and norvir and truvada.  He is taking it but having some difficulty with taking the reyataz capsule.  Also c/o bad R shoulder pain.  Worse over last few weeks.   Psoriasis is much better.  Now has lesions in gluteal crest that are painful and itchy  PRIOR  May 2010 VL 2740 CD4 280.  Prior to that visit  I last saw him in July 09 but he did not show up for follow up except BW in October 2009.   Has been seeingDr Arlyce Dice in GI  for dysphagia - see rec GI Recommendations #1 esophageal manometry -had this done last week but does not know results. #2 upper endoscopy following manometry.  I would consider Botox injection as a temporizing measure though ultimately I think he would be best suited to undergo a myotomy   Prefers surg over repeat botox if possible.  Main c/o today is rash between legs -  Still with R shoulder pain - no longer following with [facility] (Dr Roney Mans who did his second shoulder replacement surgery in 2008) but says pain is severe and he needs occas percocet (1-2 per day)  Feels zonked so tries nto to take too many.  We tried to refer to pain clinic here but no benefit.  Stopped Cocaine use but occas marijuana .  Gave up tob several  months ago but occas smokes now.    Not sexually active currently - uses condoms but does not always disclose status.   Preventive Screening-Counseling & Management  Alcohol-Tobacco     Alcohol drinks/day: 0     Smoking Status: current     Packs/Day: 0.5     Year Quit: 2010     Passive Smoke Exposure: yes  Caffeine-Diet-Exercise     Caffeine use/day: Two 2 liter sodas per day     Does Patient Exercise: yes     Type of exercise: at home     Exercise (avg: min/session): 30-60     Times/week: 6  Safety-Violence-Falls     Seat Belt Use: yes   Current Allergies (reviewed today): No known allergies  Past History:  Past Medical History: Last updated: 02/25/2009 Humeral fracture - s/p surg by Dr Roney Mans HIV disease - dxed in 1993.  Had developed Hep A and was dxed at that time.     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  Uses condoms. Family does not know his status Lives alone  Risk Factors: Alcohol Use: 0 (12/30/2009) Caffeine Use: Two 2 liter sodas per day (12/30/2009) Exercise: yes (12/30/2009)  Risk Factors: Smoking Status: current (12/30/2009) Packs/Day: 0.5 (12/30/2009) Passive Smoke Exposure: yes (12/30/2009)  Review of Systems  11 systems reviewed and negative except per HPI   Vital Signs:  Patient profile:   45 year old male Height:      73 inches (185.42 cm) Weight:      196.1 pounds (89.14 kg) BMI:     25.97 Temp:     97.2 degrees F (36.22 degrees C) oral Pulse rate:   83 / minute BP sitting:   140 / 77  (left arm)  Vitals Entered By: Baxter Hire) (December 30, 2009 3:07 PM) CC: 6 week check up / right shoulder is painful Pain Assessment Patient in pain? yes     Location: right shoulder Intensity: 8 Type: aching Onset of pain  Constant Nutritional Status BMI of 25 - 29 = overweight Nutritional Status Detail appetite is better per patient  Have you ever been in a relationship where you  felt threatened, hurt or afraid?No   Does patient need assistance? Functional Status Self care Ambulation Normal   Physical Exam  General:  alert and well-developed.   Head:  normocephalic, atraumatic, and no abnormalities palpated.   Eyes:  vision grossly intact and pupils equal.   Mouth:  fair dentition.   Neck:  supple and full ROM.   Msk:  r shoulder limited rom and healed scar  Neurologic:  alert & oriented X3 and cranial nerves II-XII intact.   Skin:  gluteal crest with several shallow ulcers with wet base and tender to palpation Cervical Nodes:  no anterior cervical adenopathy.      Impression & Recommendations:  Problem # 1:  HIV DISEASE (ICD-042)  He  restarted ARVs 6 weeks ago.   Start reyataz  norvir and truvada and is doing ok but some intolerance. Check BW today and follow up in one month. His updated medication list for this problem includes:    Sulfamethoxazole-trimethoprim 400-80 Mg/15ml Soln (Sulfamethoxazole-trimethoprim) .Marland KitchenMarland KitchenMarland KitchenMarland Kitchen 10 cc by mouth once daily    Diflucan 200 Mg Tabs (Fluconazole) ..... One by mouth once daily ro 10 days    Valtrex 1 Gm Tabs (Valacyclovir hcl) ..... One by mouth three times a day  Diagnostics Reviewed:  HIV: CDC-defined AIDS (05/16/2008)   CD4: 190 (08/13/2009)   WBC: 5.6 (08/12/2009)   Hgb: 13.2 (08/12/2009)   HCT: 40.6 (08/12/2009)   Platelets: 152 (08/12/2009) HIV genotype: See Comment (02/25/2009)   HIV-1 RNA: 30300 (08/12/2009)   HBSAg: NO (12/19/2006)  Diagnostics Reviewed:  HIV: CDC-defined AIDS (05/16/2008)   CD4: 140 (11/21/2009)   WBC: 4.1 (11/20/2009)   Hgb: 13.4 (11/20/2009)   HCT: 41.4 (11/20/2009)   Platelets: 173 (11/20/2009) HIV genotype: See Comment (02/25/2009)   HIV-1 RNA: 04540 (11/20/2009)   HBSAg: NO (12/19/2006)  Problem # 2:  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 Patient Level IV (98119) T-Herpes Culture (14782) Diagnostic X-Ray/Fluoroscopy  (Diagnostic X-Ray/Flu)  Problem # 3:  PSORIASIS (ICD-696.1) much imrpoved with steroid cream  Problem # 4:  ACHALASIA AND CARDIOSPASM (ICD-530.0) follows with Gi at [facility]  Problem # 5:  FRACTURE, HUMERUS (ICD-812.20) repeat xray and refer back to Dr Carola Frost for reval Orders: Orthopedic Surgeon Referral (Ortho Surgeon)  Medications Added to Medication List This Visit: 1)  Valtrex 1 Gm Tabs (Valacyclovir hcl) .... One by mouth three times a day  Other Orders: T-CD4SP [facility]) (CD4SP) T-HIV Viral Load [phone]) T-Comprehensive Metabolic Panel [phone]) T-CBC w/Diff [phone])  Patient Instructions: 1)  Please schedule a follow-up appointment in 1 month. 2)  Take your valtrex for 10 days for the rash on backside. 3)  Continue to take your other medicines, 4)  Follow up with Dr Carola Frost  Prescriptions: VALTREX 1 GM TABS (VALACYCLOVIR HCL) one by mouth three times a day  #30 x 1   Entered and Authorized by:   Clydie Braun MD   Signed by:   Clydie Braun MD on 12/30/2009   Method used:   Electronically to        [facility]  [address] [id number] (retail)       [address]       [place], [place]  [address]       Ph: [phone] or [phone]       Fax: [phone]   RxID:   [id number]  Process Orders Check Orders Results:     [facility]: ABN not required for this insurance Tests Sent for requisitioning (December 30, 2009 3:44 PM):     12/30/2009: [facility] -- T-Herpes Culture [[id number]] (signed)     12/30/2009: [facility] -- T-HIV Viral Load [phone] (signed)     12/30/2009: [facility] -- T-Comprehensive Metabolic Panel [[id number]] (signed)     12/30/2009: [facility] -- [facility] w/Diff [[id number]] (signed)

## 2010-11-25 NOTE — Progress Notes (Signed)
Summary: Diflucan refill request   Phone Note Call from Patient   Summary of Call: Pt also requesting Diflucan.      New/Updated Medications: DIFLUCAN 100 MG TABS (FLUCONAZOLE) take one tablet two times a day for 14 days Prescriptions: DIFLUCAN 100 MG TABS (FLUCONAZOLE) take one tablet two times a day for 14 days  #28 x 0   Entered by:   Tomasita Morrow RN   Authorized by:   Johny Sax MD   Signed by:   Tomasita Morrow RN on 05/14/2010   Method used:   Print then Give to Patient   RxID:   [id number]

## 2010-11-25 NOTE — Assessment & Plan Note (Signed)
Summary: HAVING SOME PROBLEMS. SB.   Referring Provider:  Clydie Braun MD Primary Provider:  Clydie Braun MD  CC:  Having some problems-broke out with a rash and pain in shoulder.  History of Present Illness: 45 yo AAM with HIV who is intermittent in his followup and med adherence.  Last seen in Sept - folowing with Dr Arlyce Dice for his achalasia and he is now referred to [facility] for reeval.  His n/v has improved some.  PRIOR  May 2010 VL 2740 CD4 280.  Prior to that visit  I last saw him in July 09 but he did not show up for follow up except BW in October 2009.   Has been seeingDr Arlyce Dice in GI  for dysphagia - see rec GI Recommendations #1 esophageal manometry -had this done last week but does not know results. #2 upper endoscopy following manometry.  I would consider Botox injection as a temporizing measure though ultimately I think he would be best suited to undergo a myotomy   Prefers surg over repeat botox if possible.  Main c/o today is rash between legs -  Still with R shoulder pain - no longer following with [facility] (Dr Roney Mans who did his second shoulder replacement surgery in 2008) but says pain is severe and he needs occas percocet (1-2 per day)  Feels zonked so tries nto to take too many.  We tried to refer to pain clinic here but no benefit.  Stopped Cocaine use but occas marijuana .  Gave up tob several  months ago but occas smokes now.    Not sexually active currently - uses condoms but does not always disclose status.   Preventive Screening-Counseling & Management  Alcohol-Tobacco     Alcohol drinks/day: 0     Smoking Status: current     Packs/Day: 0.5     Passive Smoke Exposure: yes  Caffeine-Diet-Exercise     Caffeine use/day: Two 2 liter sodas per day     Does Patient Exercise: yes     Type of exercise: at home     Exercise (avg: min/session): 30-60     Times/week: 6  Safety-Violence-Falls     Seat Belt Use: yes   Updated Prior Medication  List: PROVENTIL HFA 108 (90 BASE) MCG/ACT  AERS (ALBUTEROL SULFATE) one to two puffs q 6 hours as needed AMLACTIN 12 % CREA (AMMONIUM LACTATE) apply as needed SULFAMETHOXAZOLE-TRIMETHOPRIM 400-80 MG/5ML SOLN (SULFAMETHOXAZOLE-TRIMETHOPRIM) 10 cc by mouth once daily NYSTATIN 100000 UNIT/GM CREA (NYSTATIN) apply to affected area three times a day  for 15 days  Current Allergies (reviewed today): No known allergies  Past History:  Past Medical History: Last updated: 02/25/2009 Humeral fracture - s/p surg by Dr Roney Mans HIV disease - dxed in 1993.  Had developed Hep A and was dxed at that time.     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  Uses condoms. Family does not know his status Lives alone  Risk Factors: Alcohol Use: 0 (11/20/2009) Caffeine Use: Two 2 liter sodas  per day (11/20/2009) Exercise: yes (11/20/2009)  Risk Factors: Smoking Status: current (11/20/2009) Packs/Day: 0.5 (11/20/2009) Passive Smoke Exposure: yes (11/20/2009)  Review of Systems       11 systems reviewed and negative except per HPI   Vital Signs:  Patient profile:   45 year old male Height:      73 inches (185.42 cm) Weight:      193.8 pounds (88.09 kg) BMI:     25.66 Temp:     98.0 degrees F (36.67 degrees C) oral Pulse rate:   80 / minute BP sitting:   131 / 78  (left arm)  Vitals Entered By: Baxter Hire) (November 20, 2009 10:54 AM) CC: Having some problems-broke out with a rash and pain in  shoulder Is Patient Diabetic? No Pain Assessment Patient in pain? yes     Location: right shoulder Intensity: 6 Type: aching Onset of pain  Constant Nutritional Status BMI of 25 - 29 = overweight Nutritional Status Detail appetite is not so good per patient  Have you ever been in a relationship where you felt threatened, hurt or afraid?No   Does patient need assistance? Functional Status Self care Ambulation Normal   Physical Exam  General:  alert and well-developed.   Head:  normocephalic and atraumatic.   Mouth:  fair dentition.   Neck:  supple.   Lungs:  normal respiratory effort and normal breath sounds.   Heart:  normal rate, regular rhythm, and no murmur.   Abdomen:  soft and non-tender.   Msk:  normal ROM and no joint tenderness.   Extremities:  nocce Neurologic:  alert & oriented X3 and cranial nerves II-XII intact.   Skin:  groin with erythematous and scaly eruption aslo over buttick crease and on ascalp Cervical Nodes:  no anterior cervical adenopathy and no posterior cervical adenopathy.   Psych:  Oriented X3 and memory intact for recent and remote.      Impression & Recommendations:  Problem # 1:  HIV DISEASE (ICD-042)  He is ready to restart ARVs now that his Achalasia is being worked on with [facility]. Start reyataz  norvir and truvada WIll restart ARVs and follow up in 6 wks His updated medication list for this problem includes:    Sulfamethoxazole-trimethoprim 400-80 Mg/28ml Soln (Sulfamethoxazole-trimethoprim) .Marland KitchenMarland KitchenMarland KitchenMarland Kitchen 10 cc by mouth once daily    Diflucan 200 Mg Tabs (Fluconazole) ..... One by mouth once daily ro 10 days  Diagnostics Reviewed:  HIV: CDC-defined AIDS (05/16/2008)   CD4: 190 (08/13/2009)   WBC: 5.6 (08/12/2009)   Hgb: 13.2 (08/12/2009)   HCT: 40.6 (08/12/2009)   Platelets: 152 (08/12/2009) HIV genotype: See Comment (02/25/2009)   HIV-1 RNA: 30300 (08/12/2009)   HBSAg: NO (12/19/2006)  Problem # 2:  OTHER CANDIDIASIS OF OTHER SPECIFIED SITES  (ICD-112.89) will try fluc as he said he had thrush noted on egd.  Problem # 3:  PSORIASIS (ICD-696.1) I think htis is the etiologyu of his scalp groin and buttick rash. start topical treatment with steroidsand f/u in one month  Problem # 4:  FRACTURE, HUMERUS (ICD-812.20) Assessment: Unchanged  Chronic pain -   Medications Added to Medication List This Visit: 1)  Reyataz 300 Mg Caps (Atazanavir sulfate) .... One by mouth once daily with norvir 2)  Norvir 100 Mg Caps (Ritonavir) .... One by mouth once daily with reyataz 3)  Truvada 200-300 Mg Tabs (Emtricitabine-tenofovir) .... One by mouth once daily 4)  Clobetasol Propionate 0.05 % Crea (Clobetasol propionate) .... Apply to affected  area bid 5)  Clobetasol Propionate 0.05 % Soln (Clobetasol propionate) .... Apply to affect scalp two times a day as needed 6)  Diflucan 200 Mg Tabs (Fluconazole) .... One by mouth once daily ro 10 days 7)  Percocet 5-325 Mg Tabs (Oxycodone-acetaminophen) .... One by mouth q 8 hrous as needed for pain  Other Orders: T-CD4SP [facility]) (CD4SP) Est. Patient Level V [id number]) Influenza Vaccine NON MCR (60454) T-Comprehensive Metabolic Panel [phone]) T-CBC w/Diff [phone]) T-RPR (Syphilis) [id number]) T-HIV Viral Load [phone]) Future Orders: T-CD4SP ([facility]) (CD4SP) ... 01/01/2010 T-HIV Viral Load [phone]) ... 01/01/2010 T-CBC w/Diff ([id number]) ... 01/01/2010 T-Comprehensive Metabolic Panel [phone]) ... 01/01/2010  Patient Instructions: 1)  Please schedule a follow-up appointment in 6weeks. 2)  Start your new medicines and creams 3)  Be sure to return for lab work one (1) week before your next appointment as scheduled.   Immunizations Administered:  Influenza Vaccine # 1:    Vaccine Type: Fluvax Non-MCR    Site: left deltoid    Mfr: Novartis    Dose: 0.5 ml    Route: IM    Given by: Kathi Simpers CMA(AAMA)    Exp. Date: 01/23/2010    Lot #: [id number]    VIS given:  05/18/07 version given November 20, 2009.  Flu Vaccine Consent Questions:    Do you have a history of severe allergic reactions to this vaccine? no    Any prior history of allergic reactions to egg and/or gelatin? no    Do you have a sensitivity to the preservative Thimersol? no    Do you have a past history of Guillan-Barre Syndrome? no    Do you currently have an acute febrile illness? no    Have you ever had a severe reaction to latex? no    Vaccine information given and explained to patient? yes  Prescriptions: PERCOCET 5-325 MG TABS (OXYCODONE-ACETAMINOPHEN) one by mouth q 8 hrous as needed for pain  #100 x 0   Entered and Authorized by:   Clydie Braun MD   Signed by:   Clydie Braun MD on 11/20/2009   Method used:   Print then Give to Patient   RxID:   [id number] DIFLUCAN 200 MG TABS (FLUCONAZOLE) one by mouth once daily ro 10 days  #10 x 0   Entered and Authorized by:   Clydie Braun MD   Signed by:   Clydie Braun MD on 11/20/2009   Method used:   Print then Give to Patient   RxID:   [id number] CLOBETASOL PROPIONATE 0.05 % SOLN (CLOBETASOL PROPIONATE) apply to affect scalp two times a day as needed  #1 x 3   Entered and Authorized by:   Clydie Braun MD   Signed by:   Clydie Braun MD on 11/20/2009   Method used:   Print then Give to Patient   RxID:   [id number] CLOBETASOL PROPIONATE 0.05 % CREA (CLOBETASOL PROPIONATE) apply to affected area bid  #1 x 3   Entered and Authorized by:   Clydie Braun MD   Signed by:   Clydie Braun MD on 11/20/2009   Method used:   Print then Give to Patient   RxID:   [id number] SULFAMETHOXAZOLE-TRIMETHOPRIM 400-80 MG/5ML SOLN (SULFAMETHOXAZOLE-TRIMETHOPRIM) 10 cc by mouth once daily  #1 x 6   Entered and Authorized by:   Clydie Braun MD   Signed by:   Clydie Braun MD on 11/20/2009   Method used:   Print then Give to Patient   RxID:  [id number] TRUVADA 200-300 MG TABS  (EMTRICITABINE-TENOFOVIR) one by mouth once daily  #31 x 3   Entered and Authorized by:   Clydie Braun MD   Signed by:   Clydie Braun MD on 11/20/2009   Method used:   Print then Give to Patient   RxID:   [id number] NORVIR 100 MG CAPS (RITONAVIR) one by mouth once daily with reyataz  #31 x 3   Entered and Authorized by:   Clydie Braun MD   Signed by:   Clydie Braun MD on 11/20/2009   Method used:   Print then Give to Patient   RxID:   [id number] REYATAZ 300 MG CAPS (ATAZANAVIR SULFATE) one by mouth once daily with norvir  #31 x 3   Entered and Authorized by:   Clydie Braun MD   Signed by:   Clydie Braun MD on 11/20/2009   Method used:   Print then Give to Patient   RxID:   [id number]  Process Orders Check Orders Results:     [facility]: ABN not required for this insurance Tests Sent for requisitioning (December 01, 2009 9:31 PM):     01/01/2010: [facility] -- T-HIV Viral Load [phone] (signed)     01/01/2010: [facility] -- T-CBC w/Diff [[id number]] (signed)     01/01/2010: [facility] -- T-Comprehensive Metabolic Panel [[id number]] (signed)     11/20/2009: [facility] -- T-Comprehensive Metabolic Panel [[id number]] (signed)     11/20/2009: [facility] -- T-CBC w/Diff [[id number]] (signed)     11/20/2009: [facility] -- T-RPR (Syphilis) [phone] (signed)     11/20/2009: [facility] -- T-HIV Viral Load [phone] (signed)

## 2010-11-25 NOTE — Miscellaneous (Signed)
  Clinical Lists Changes  Medications: Rx of PERCOCET 5-325 MG TABS (OXYCODONE-ACETAMINOPHEN) one by mouth q 8 hrous as needed for pain;  #100 x 0;  Signed;  Entered by: Clydie Braun MD;  Authorized by: Clydie Braun MD;  Method used: Print then Give to Patient    Prescriptions: PERCOCET 5-325 MG TABS (OXYCODONE-ACETAMINOPHEN) one by mouth q 8 hrous as needed for pain  #100 x 0   Entered and Authorized by:   Clydie Braun MD   Signed by:   Clydie Braun MD on 12/30/2009   Method used:   Print then Give to Patient   RxID:   [id number]

## 2010-11-25 NOTE — Miscellaneous (Signed)
  Clinical Lists Changes  Observations: Added new observation of YEARAIDSPOS: 2005  (09/14/2010 13:18)

## 2010-11-25 NOTE — Assessment & Plan Note (Signed)
Summary: F/U OV/VS   Visit Type:  Follow-up Referring Provider:  Clydie Braun MD Primary Provider:  Johny Sax MD  CC:  f/u ov  2.increased BP  3. Missed doses of meds due to problemswith swallowing -Dr Arlyce Dice 4. stabbing abdominal pain comes and goes .  History of Present Illness: 45 yo M HIV+ Jan 2011 he started [place] and norvir and truvada.  He is taking it but having some difficulty with taking the reyataz capsule.  He also has a hx of dysphagia and has been considered a candidate for a myotomy.  Had R shoulder replacement twice: 9-05, 3-09. Cont to have pain from this.  His ART was changed to ISN/TRV. He quit taking this due to GI upset. He was restarted on his ATVr/TRV at his last visit. had botox injection in for his dysphagia last week. pain doubles pt over. can eat since he had botox. having trouble with ATV getting stuck in his throat.  has f/u wtih GI next month.  Complians of pain in his R arm. would like to get back into pain mgmt center.   Preventive Screening-Counseling & Management  Alcohol-Tobacco     Alcohol drinks/day: <1     Smoking Status: current     Smoking Cessation Counseling: yes     Smoke Cessation Stage: precontemplative     Packs/Day: 0.5     Year Quit: 2010     Passive Smoke Exposure: yes  Hep-HIV-STD-Contraception     HIV Risk: risk noted     HIV Risk Counseling: to avoid increased HIV risk   Updated Prior Medication List: PROVENTIL HFA 108 (90 BASE) MCG/ACT  AERS (ALBUTEROL SULFATE) one to two puffs q 6 hours as needed AMLACTIN 12 % CREA (AMMONIUM LACTATE) apply as needed TRUVADA 200-300 MG TABS (EMTRICITABINE-TENOFOVIR) one by mouth once daily CLOBETASOL PROPIONATE 0.05 % SOLN (CLOBETASOL PROPIONATE) apply to affect scalp two times a day as needed PERCOCET 5-325 MG TABS (OXYCODONE-ACETAMINOPHEN) one by mouth q 8 hrous as needed for pain VALTREX 1 GM TABS (VALACYCLOVIR HCL) one by mouth three times a day DIFLUCAN 100 MG TABS  (FLUCONAZOLE) take one tablet two times a day for 14 days REYATAZ 200 MG CAPS (ATAZANAVIR SULFATE) Take 1 tablet by mouth once a day NORVIR 80 MG/ML SOLN (RITONAVIR) 100mg  by mouth once daily  Current Allergies (reviewed today): No known allergies  Past History:  Past medical, surgical, family and social histories (including risk factors) reviewed, and no changes noted (except as noted below).  Past Medical History: Reviewed history from 02/25/2009 and no changes required. Humeral fracture - s/p surg by Dr Roney Mans HIV disease - dxed in 1993.  Had developed Hep A and was dxed at that time.     NO OIs per his report except thrush Polysubstance abuse Boils under arms Hemorrhoids Plantar warts hands and feet  Past Surgical History: Reviewed history from 05/16/2008 and no changes required. s/p R shoulder replacement x 2  - most recent March of 08  Family History: Reviewed history from 04/18/2009 and no changes required. Family History of CAD Male 1st degree relative <60 No FH of Colon Cancer: Family History Hypertension  Social History: Reviewed history from 06/15/2010 and no changes required. 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 Family does not know his status Lives alone  Vital Signs:  Patient profile:   45 year old male Height:  73 inches (185.42 cm) Weight:      196 pounds (89.09 kg) BMI:     25.95 Pulse rate:   80 / minute BP sitting:   153 / 93  (right arm)  Vitals Entered By: Starleen Arms CMA (August 31, 2010 11:15 AM) CC: f/u ov  2.increased BP  3. Missed doses of meds due to problemswith swallowing -Dr Arlyce Dice 4. stabbing abdominal pain comes and goes  Is Patient Diabetic? No Pain Assessment Patient in pain? yes     Location: stomach  Intensity: 10 Type: sharp Nutritional Status Detail decreased due to swallowing problems  Have you ever been in a relationship where  you felt threatened, hurt or afraid?No  Domestic Violence Intervention None   Does patient need assistance? Functional Status Self care Ambulation Normal   Physical Exam  General:  well-developed, well-nourished, and well-hydrated.   Eyes:  pupils equal, pupils round, and pupils reactive to light.   Mouth:  pharynx pink and moist and no exudates.   Neck:  no masses.   Lungs:  normal respiratory effort and normal breath sounds.   Heart:  normal rate, regular rhythm, and no murmur.   Abdomen:  soft, non-tender, and normal bowel sounds.          Medication Adherence: 08/31/2010   Adherence to medications reviewed with patient. Counseling to provide adequate adherence provided   Prevention For Positives: 08/31/2010   Safe sex practices discussed with patient. Condoms offered.                             Impression & Recommendations:  Problem # 1:  HIV DISEASE (ICD-042)  he would like to change his medicine. Believes that his previous GI pain on ISN was due to his underlying problems and not the medicine. He is not sexually active. will have him back in 3 months.  His updated medication list for this problem includes:    Valtrex 1 Gm Tabs (Valacyclovir hcl) ..... One by mouth three times a day    Diflucan 100 Mg Tabs (Fluconazole) .Marland Kitchen... Take one tablet two times a day for 14 days  Orders: Pain Clinic Referral (Pain)  Problem # 2:  ACHALASIA AND CARDIOSPASM (ICD-530.0)  encouraged him to f/u with Dr Arlyce Dice.   Orders: Pain Clinic Referral (Pain)  Problem # 3:  HYPERTENSION (ICD-401.9)  offered to start him on anti-HTN- after confering with his mother he agrees.  BP in 2 weeks.   His updated medication list for this problem includes:    Atenolol 25 Mg Tab (Atenolol) .Marland Kitchen... Take one (1) tablet by mouth daily  Medications Added to Medication List This Visit: 1)  Diflucan 100 Mg Tabs (Fluconazole) .... Take one tablet two times a day for 14 days 2)  Isentress 400 Mg  Tabs (Raltegravir potassium) .... Take 1 tablet by mouth two times a day 3)  Atenolol 25 Mg Tab (Atenolol) .... Take one (1) tablet by mouth daily Prescriptions: ATENOLOL 25 MG TAB (ATENOLOL) Take one (1) tablet by mouth daily  #30 x 3   Entered and Authorized by:   Johny Sax MD   Signed by:   Johny Sax MD on 08/31/2010   Method used:   Print then Give to Patient   RxID:   [id number] ISENTRESS 400 MG TABS (RALTEGRAVIR POTASSIUM) Take 1 tablet by mouth two times a day  #180 x 3   Entered and Authorized by:  Johny Sax MD   Signed by:   Johny Sax MD on 08/31/2010   Method used:   Electronically to        [facility]  [address] [id number] ([facility]       [address]       [place], [place]  [address]       Ph: [phone] or [phone]       Fax: [phone]   RxID:   [id number] DIFLUCAN 100 MG TABS (FLUCONAZOLE) take one tablet two times a day for 14 days  #10 x 3   Entered and Authorized by:   Johny Sax MD   Signed by:   Johny Sax MD on 08/31/2010   Method used:   [facility]  [address] [id number]       [address]       [place], [place]  [address]       Ph: [phone] or [phone]       Fax: [phone]   RxID:   [id number]   Appended Document: Orders Update    Clinical Lists Changes  Orders: Added new Service order of Est. Patient Level IV (42706) - Signed

## 2010-11-25 NOTE — Consult Note (Signed)
Summary: Ortho. Trauma Specialist  Ortho. Trauma Specialist   Imported By: Florinda Marker 04/13/2010 16:36:50  _____________________________________________________________________  External Attachment:    Type:   Image     Comment:   External Document

## 2010-11-25 NOTE — Letter (Signed)
Summary: [facility]   Imported By: Sherian Rein 02/23/2010 13:11:52  _____________________________________________________________________  External Attachment:    Type:   Image     Comment:   External Document

## 2010-11-25 NOTE — Progress Notes (Signed)
Summary: Botox injections  Phone Note Call from Patient Call back at [facility] Phone [phone]   Call For: Dr Arlyce Dice Summary of Call: Wants to schedule for a botox injection Initial call taken by: Leanor Kail [facility],  July 31, 2010 11:17 AM  Follow-up for Phone Call        Patient c/o dysphagia, he was seen in the ER at [facility] last week for abdominal pain.  He has a follow up with Dr Gery Pray( surgeon) at [facility] in November, he states they recommended a Botox injection until they could see him in November.  He denies painful swallowing, but having dysphagia to solids.  Dr Arlyce Dice please advise a direct procedure, or do you want to come for an office visit? Follow-up by: Darcey Nora RN, CGRN,  July 31, 2010 11:47 AM  Additional Follow-up for Phone Call Additional follow up Details #1::        okay to schedule EGD with botox injection Additional Follow-up by: [name] MD,  July 31, 2010 12:09 PM    Additional Follow-up for Phone Call Additional follow up Details #2::    Patient  is scheduled for EGD with Botox for 08/24/10 8:30.  I will mail him instructions.   Follow-up by: Darcey Nora RN, CGRN,  July 31, 2010 3:09 PM

## 2010-11-25 NOTE — Miscellaneous (Signed)
Summary: New Ref[facility] : [facility]  New Referral : [facility]   Imported By: Florinda Marker 09/28/2010 14:38:26  _____________________________________________________________________  External Attachment:    Type:   Image     Comment:   External Document

## 2010-11-25 NOTE — Progress Notes (Signed)
Summary: rf request fodr percocet  Phone Note Call from Patient   Caller: Patient Call For: Benjamin Sax, MD Summary of Call: Patient wants rf for percocet last filled 03/11/2010 Initial call taken by: Starleen Arms CMA,  April 14, 2010 3:53 PM    Prescriptions: PERCOCET 5-325 MG TABS (OXYCODONE-ACETAMINOPHEN) one by mouth q 8 hrous as needed for pain  #100 x 0   Entered by:   Starleen Arms CMA   Authorized by:   Benjamin Sax MD   Signed by:   Starleen Arms CMA on 04/15/2010   Method used:   Handwritten   RxID:   [id number]

## 2010-11-25 NOTE — Miscellaneous (Signed)
Summary: Medication Contract  Medication Contract   Imported By: Florinda Marker 03/12/2010 09:31:51  _____________________________________________________________________  External Attachment:    Type:   Image     Comment:   External Document

## 2010-11-25 NOTE — Consult Note (Signed)
Summary: [facility]: Esophageal  [facility]: Esophageal   Imported By: Florinda Marker 12/25/2009 16:58:27  _____________________________________________________________________  External Attachment:    Type:   Image     Comment:   External Document

## 2010-11-25 NOTE — Letter (Signed)
Summary: EGD Instructions  [facility]  [address] [place], [place] [address]   Phone: [phone]  Fax: [phone]       Benjamin Herrera    1966-09-16    MRN: [id number]       Procedure Day /Date: Monday 08/24/10        Arrival Time: 7:30 a.m.     Procedure Time: 8:30 a.m.     Location of Procedure:                     _ x _ [name] ( Outpatient Registration)    PREPARATION FOR ENDOSCOPY   On Monday  08/24/10  THE DAY OF THE PROCEDURE:  1.   No solid foods, milk or milk products are allowed after midnight the night before your procedure.  2.   Do not drink anything colored red or purple.  Avoid juices with pulp.  No orange juice.  3.  You may drink clear liquids until 4:30a.m.  , which is 2 hours before your procedure.                                                                                                CLEAR LIQUIDS INCLUDE: Water Jello Ice Popsicles Tea (sugar ok, no milk/cream) Powdered fruit flavored drinks Coffee (sugar ok, no milk/cream) Gatorade Juice: apple, white grape, white cranberry  Lemonade Clear bullion, consomm, broth Carbonated beverages (any kind) Strained chicken noodle soup Hard Candy   MEDICATION INSTRUCTIONS  Unless otherwise instructed, you should take regular prescription medications with a small sip of water as early as possible the morning of your procedure.  Diabetic patients - see separate instructions.   Additional medication instructions: _             OTHER INSTRUCTIONS  You will need a responsible adult at least 45 years of age to accompany you and drive you home.   This person must remain in the waiting room during your procedure.  Wear loose fitting clothing that is easily removed.  Leave jewelry and other valuables at home.  However, you may wish to bring a book to read or an iPod/MP3 player to listen to music as you wait for your procedure to start.  Remove all body  piercing jewelry and leave at home.  Total time from sign-in until discharge is approximately 2-3 hours.  You should go home directly after your procedure and rest.  You can resume normal activities the day after your procedure.  The day of your procedure you should not:   Drive   Make legal decisions   Operate machinery   Drink alcohol   Return to work  You will receive specific instructions about eating, activities and medications before you leave.    The above instructions have been reviewed and explained to me by   _______________________    I fully understand and can verbalize these instructions _____________________________ Date _________

## 2010-11-25 NOTE — Assessment & Plan Note (Signed)
Summary: PER DR Luther Redo F/U/VS   Referring Provider:  Clydie Braun MD Primary Provider:  Clydie Braun MD  CC:  follow-up visit, previous pt of Dr. Sampson Goon, and seeing MD @ [facility] for shoulder complaints.  History of Present Illness: 45 yo M HIV+ Jan 2011 he started [place] and norvir and truvada.  He is taking it but having some difficulty with taking the reyataz capsule.  He also has a hx of dysphagia and has been considered a candidate for a myotomy.  Had R shoulder replacement twice: 9-05, 3-09. Cont to have pain from this.  His ART was changed to ISN/TRV. He quit taking this due to GI upset. He quit taking his ART and this pain resolved. He would like to go back to his previous meds.   Preventive Screening-Counseling & Management  Alcohol-Tobacco     Alcohol drinks/day: <1     Smoking Status: current     Smoking Cessation Counseling: yes     Smoke Cessation Stage: precontemplative     Packs/Day: 0.5     Year Quit: 2010     Passive Smoke Exposure: yes  Caffeine-Diet-Exercise     Caffeine use/day: Two 2 liter sodas per day     Does Patient Exercise: no     Type of exercise: at home     Exercise (avg: min/session): 30-60     Times/week: 6  Hep-HIV-STD-Contraception     HIV Risk: no risk noted  Safety-Violence-Falls     Seat Belt Use: yes  Comments: given condoms      Sexual History:  n/a.        Drug Use:  current, former, marijuana, and occas.     Updated Prior Medication List: PROVENTIL HFA 108 (90 BASE) MCG/ACT  AERS (ALBUTEROL SULFATE) one to two puffs q 6 hours as needed AMLACTIN 12 % CREA (AMMONIUM LACTATE) apply as needed TRUVADA 200-300 MG TABS (EMTRICITABINE-TENOFOVIR) one by mouth once daily CLOBETASOL PROPIONATE 0.05 % SOLN (CLOBETASOL PROPIONATE) apply to affect scalp two times a day as needed PERCOCET 5-325 MG TABS (OXYCODONE-ACETAMINOPHEN) one by mouth q 8 hrous as needed for pain VALTREX 1 GM TABS (VALACYCLOVIR HCL) one by mouth three  times a day ISENTRESS 400 MG TABS (RALTEGRAVIR POTASSIUM) one by mouth two times a day  Take this in place of norvir and reyataz. DIFLUCAN 100 MG TABS (FLUCONAZOLE) take one tablet two times a day for 14 days  Current Allergies: No known allergies  Past History:  Past medical, surgical, family and social histories (including risk factors) reviewed, and no changes noted (except as noted below).  Past Medical History: Reviewed history from 02/25/2009 and no changes required. Humeral fracture - s/p surg by Dr Roney Mans HIV disease - dxed in 1993.  Had developed Hep A and was dxed at that time.     NO OIs per his report except thrush Polysubstance abuse Boils under arms Hemorrhoids Plantar warts hands and feet  Past Surgical History: Reviewed history from 05/16/2008 and no changes required. s/p R shoulder replacement x 2  - most recent March of 08  Family History: Reviewed history from 04/18/2009 and no changes required. Family History of CAD Male 1st degree relative <60 No FH of Colon Cancer:  Social History: Reviewed history from 04/18/2009 and no changes required. 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 Family does not know his status Lives aloneSexual History:  n/a Drug Use:  current, former, marijuana, occas  Review of Systems       wt down 8#.   Vital Signs:  Patient profile:   45 year old male Height:      73 inches (185.42 cm) Weight:      193 pounds (87.73 kg) BMI:     25.56 Temp:     98.8 degrees F (37.11 degrees C) oral Pulse rate:   101 / minute  Vitals Entered By: Jennet Maduro RN (June 15, 2010 10:40 AM) CC: follow-up visit, previous pt of Dr. Sampson Goon, seeing MD @ [facility] for shoulder complaints Is Patient Diabetic? No Pain Assessment Patient in pain? yes     Location: right shoulder Intensity: 4 Type: aching Onset of pain  Constant Nutritional Status BMI of 19 -24  = normal Nutritional Status Detail appetite "fine"  Have you ever been in a relationship where you felt threatened, hurt or afraid?No   Does patient need assistance? Functional Status Self care Ambulation Normal Comments stopped taking his medications due abdominal cramps about 3 weeks   Physical Exam  General:  well-developed, well-nourished, and well-hydrated.   Ears:  B ears with cerumen. pt asks to have disimpacted, which is done.  Mouth:  pharynx pink and moist, no exudates, and teeth missing.   Neck:  no masses.   Lungs:  normal respiratory effort and normal breath sounds.   Heart:  normal rate, regular rhythm, and no murmur.   Abdomen:  soft, non-tender, and normal bowel sounds.   Genitalia:  few scrotal maccules.         Medication Adherence: 06/15/2010   Adherence to medications reviewed with patient. Counseling to provide adequate adherence provided   Prevention For Positives: 06/15/2010   Safe sex practices discussed with patient. Condoms offered.                             Impression & Recommendations:  Problem # 1:  HIV DISEASE (ICD-042)  lbas are due but he has been off art and he is changing meds. will restart his ATVr/TRV. given condoms. his ear is disimpacted. states he has not been sexually active. return to clinic 2-3 months.  His updated medication list for this problem includes:    Valtrex 1 Gm Tabs (Valacyclovir hcl) ..... One by mouth three times a day    Diflucan 100 Mg Tabs (Fluconazole) .Marland Kitchen... Take one tablet two times a day for 14 days  Orders: Est. Patient Level IV ([address]) T-GC Probe, urine ([address]454-09811) T-Chlamydia  Probe, urine ([address]1478-29562) Cerumen Impaction Removal (13086)  Problem # 2:  FRACTURE, HUMERUS (ICD-812.20)  will f/u with his MD at [facility].   Orders: Est. Patient Level IV ([address])  Medications Added to Medication List This Visit: 1)  Reyataz 200 Mg Caps (Atazanavir sulfate) .... Take 1 tablet by mouth once a day 2)   Norvir 80 Mg/ml Soln (Ritonavir) .... 100mg  by mouth once daily  Other Orders: Influenza Vaccine NON MCR ([address]) T-RPR (Syphilis) ([address]13-24401)  Prescriptions: NORVIR 80 MG/ML SOLN (RITONAVIR) 100mg  by mouth once daily  #90 days x 3   Entered and Authorized by:   Johny Sax MD   Signed by:   Johny Sax MD on 06/15/2010   Method used:   Electronically to        [facility] (retail)       [address]       [place], [place]  [id number]  Ph: [phone]       Fax: [phone]   RxID:   [id number] REYATAZ 200 MG CAPS (ATAZANAVIR SULFATE) Take 1 tablet by mouth once a day  #90 x 3   Entered and Authorized by:   Johny Sax MD   Signed by:   Johny Sax MD on 06/15/2010   Method used:   Electronically to        [facility] (retail)       [address]       [place], [place]  [address]       Ph: [phone]       Fax: [phone]   RxID:   [id number] DIFLUCAN 100 MG TABS (FLUCONAZOLE) take one tablet two times a day for 14 days  #28 x 0   Entered and Authorized by:   Johny Sax MD   Signed by:   Johny Sax MD on 06/15/2010   Method used:   Electronically to        [facility] (retail)       [address]       [place], [place]  [address]       Ph: [phone]       Fax: [phone]   RxID:   [id number] TRUVADA 200-300 MG TABS (EMTRICITABINE-TENOFOVIR) one by mouth once daily  #90 x 3   Entered and Authorized by:   Johny Sax MD   Signed by:   Johny Sax MD on 06/15/2010   Method used:   Electronically to        [facility] (retail)       [address]       [place], [place]  [address]       Ph: [phone]       Fax: [phone]   RxID:   [id number]         Medication Adherence: 06/15/2010   Adherence to medications reviewed with patient. Counseling to provide adequate adherence provided      Prevention For Positives: 06/15/2010   Safe sex practices discussed with patient. Condoms offered.                              Influenza Vaccine    Vaccine Type: Fluvax Non-MCR    Site: left deltoid    Mfr: novartis    Dose: 0.5 ml    Route: IM    Given by: Jennet Maduro RN    Exp. Date: 01/24/2011    Lot #: [id number]  Flu Vaccine Consent Questions    Do you have a history of severe allergic reactions to this vaccine? no    Any prior history of allergic reactions to egg and/or gelatin? no    Do you have a sensitivity to the preservative Thimersol? no    Do you have a past history of Guillan-Barre Syndrome? no    Do you currently have an acute febrile illness? no    Have you ever had a severe reaction to latex? no    Vaccine information given and explained to patient? yes   Process Orders Check Orders Results:     [facility]: ABN not required for this insurance Tests Sent for requisitioning (June 15, 2010 11:54 AM):     06/15/2010: [facility] -- T-RPR (Syphilis) [phone] (signed)     06/15/2010: [facility] -- T-GC Probe, urine [id number] (signed)  06/15/2010: [facility] -- T-Chlamydia  Probe, urine [phone] (signed)

## 2010-11-25 NOTE — Progress Notes (Signed)
Summary: Percocet refill denied   Phone Note Call from Patient   Caller: Patient Summary of Call: Pt called for Percocet Rf . I asked the patient was he was taking Percocet and he stated shoulder pain.  There are notes in the computer that state he is being followed by an orthopedist. I told the patient he would need to see the ortho doc for treatment of this problem.  We are his ID doc and do not treat chronic pain that is not related to his HIV.   He was informed his refill is denied and he should keep is Nov visit with Dr Ninetta Lights to discuss this issue.  In the meantime he should call  Dr Carola Frost or [facility] for an OV for treatment options.  Pt states his is having problems swallowing pills and has not taken any meds for about 3 days. He is having stomach surgery on Aug 24, 2010. He was advised to come for labs anyway.  Tomasita Morrow RN  August 13, 2010 10:10 AM

## 2010-11-25 NOTE — Progress Notes (Signed)
Summary: Medication  Phone Note Call from Patient Call back at Home Phone [phone]   Caller: Patient Call For: Dr. Arlyce Dice Reason for Call: Talk to Nurse Summary of Call: Pt needs to speak to someone about his medication Initial call taken by: [place] Johnson,  September 30, 2010 2:37 PM  Follow-up for Phone Call        Dr Arlyce Dice, This pts insurance will not cover Hyomax.Marland KitchenMarland KitchenPossibly pay for Robinul..Do you want to send?? Follow-up by: Merri Ray CMA Duncan Dull),  September 30, 2010 2:44 PM  Additional Follow-up for Phone Call Additional follow up Details #1::        yes, robinul forte 2mg  two times a day prn Additional Follow-up by: [name] MD,  October 01, 2010 11:57 AM

## 2010-11-25 NOTE — Assessment & Plan Note (Signed)
Summary: BP CK/VS   Vital Signs:  Patient profile:   45 year old male BP sitting:   134 / 81  (left arm) Prior Medications: PROVENTIL HFA 108 (90 BASE) MCG/ACT  AERS (ALBUTEROL SULFATE) one to two puffs q 6 hours as needed AMLACTIN 12 % CREA (AMMONIUM LACTATE) apply as needed TRUVADA 200-300 MG TABS (EMTRICITABINE-TENOFOVIR) one by mouth once daily CLOBETASOL PROPIONATE 0.05 % SOLN (CLOBETASOL PROPIONATE) apply to affect scalp two times a day as needed PERCOCET 5-325 MG TABS (OXYCODONE-ACETAMINOPHEN) one by mouth q 8 hrous as needed for pain VALTREX 1 GM TABS (VALACYCLOVIR HCL) one by mouth three times a day DIFLUCAN 100 MG TABS (FLUCONAZOLE) take one tablet two times a day for 14 days ISENTRESS 400 MG TABS (RALTEGRAVIR POTASSIUM) Take 1 tablet by mouth two times a day ATENOLOL 25 MG TAB (ATENOLOL) Take one (1) tablet by mouth daily Current Allergies: No known allergies

## 2010-11-25 NOTE — Progress Notes (Signed)
Summary: Percocet request  Phone Note Call from Patient   Summary of Call: Pt requesting Percocet script for pain.     He was informed Dr Sampson Goon was not in today but will returnon Monday, May, 16, 2011 . I will leave request for him to review.  Tomasita Morrow RN  Mar 06, 2010 2:16 PM   Follow-up for Phone Call        I would like to have one of the opc attendings check him on the narcotics data base but if he is not getting anything elsewhere I am ok refilling it for him  Follow-up by: Clydie Braun MD,  Mar 09, 2010 10:45 PM

## 2010-11-25 NOTE — Assessment & Plan Note (Signed)
Summary: F/U OV/VS   Referring Provider:  Clydie Braun MD Primary Provider:  Clydie Braun MD  CC:  follow-up visit.  History of Present Illness: 45 yo AAM with HIV who is intermittent in his followup and med adherence.  In Jan 2011 he started [place] and norvir and truvada.  He is taking it but having some difficulty with taking the reyataz capsule. Tried to mix the contents with apple sauce but it tasted horrible. Has missed quite a few of the doses of reyataz due to this.  Main c/o is that his shoulder is very bad.  He did not see Dr Carola Frost in [place] since he says we did not make the referral.  till with R shoulder pain - no longer following with [facility] [place] (Dr Roney Mans who did his second shoulder replacement surgery in 2008) but says pain is severe and he needs occas percocet (1-2 per day)   His psoriasis rash is gone.  He had another herpes outbreak but resolved when he took the valtrex.   PRIOR  May 2010 VL 2740 CD4 280.  Prior to that visit  I last saw him in July 09 but he did not show up for follow up except BW in October 2009.   Has been seeingDr Arlyce Dice in GI  for dysphagia - see rec GI Recommendations #1 esophageal manometry -had this done last week but does not know results. #2 upper endoscopy following manometry.  I would consider Botox injection as a temporizing measure though ultimately I think he would be best suited to undergo a myotomy   Prefers surg over repeat botox if possible.   S Stopped Cocaine use but occas marijuana .  Gave up tob several  months ago but occas smokes now.    Not sexually active currently - uses condoms but does not always disclose status.   Preventive Screening-Counseling & Management  Alcohol-Tobacco     Alcohol drinks/day: 0     Smoking Status: current     Smoking Cessation Counseling: yes     Packs/Day: 0.5     Passive Smoke Exposure: yes  Caffeine-Diet-Exercise     Caffeine use/day: Two 2 liter sodas per day     Does  Patient Exercise: yes     Type of exercise: at home     Exercise (avg: min/session): 30-60     Times/week: 6  Safety-Violence-Falls     Seat Belt Use: yes   Updated Prior Medication List: PROVENTIL HFA 108 (90 BASE) MCG/ACT  AERS (ALBUTEROL SULFATE) one to two puffs q 6 hours as needed AMLACTIN 12 % CREA (AMMONIUM LACTATE) apply as needed REYATAZ 300 MG CAPS (ATAZANAVIR SULFATE) one by mouth once daily with norvir NORVIR 100 MG CAPS (RITONAVIR) one by mouth once daily with reyataz TRUVADA 200-300 MG TABS (EMTRICITABINE-TENOFOVIR) one by mouth once daily CLOBETASOL PROPIONATE 0.05 % SOLN (CLOBETASOL PROPIONATE) apply to affect scalp two times a day as needed PERCOCET 5-325 MG TABS (OXYCODONE-ACETAMINOPHEN) one by mouth q 8 hrous as needed for pain VALTREX 1 GM TABS (VALACYCLOVIR HCL) one by mouth three times a day  Current Allergies (reviewed today): No known allergies  Past History:  Past Medical History: Last updated: 02/25/2009 Humeral fracture - s/p surg by Dr Roney Mans HIV disease - dxed in 1993.  Had developed Hep A and was dxed at that time.     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  Uses condoms. Family does not know his status Lives alone  Risk Factors: Alcohol Use: 0 (03/11/2010) Caffeine Use: Two 2 liter sodas per day (03/11/2010) Exercise: yes (03/11/2010)  Risk Factors: Smoking Status: current (03/11/2010) Packs/Day: 0.5 (03/11/2010) Passive Smoke Exposure: yes  (03/11/2010)  Review of Systems       11 systems reviewed and negative except per HPI   Vital Signs:  Patient profile:   45 year old male Height:      73 inches (185.42 cm) Weight:      201.4 pounds (91.55 kg) BMI:     26.67 Temp:     98.4 degrees F (36.89 degrees C) oral Pulse rate:   109 / minute BP sitting:   142 / 80  (left arm)  Vitals Entered By: Baxter Hire) (Mar 11, 2010 10:51 AM)  Allergies (verified): No Known Drug Allergies  CC: follow-up visit Pain Assessment Patient in pain? yes     Location: right shoulder Intensity: 4 Type: aching Onset of pain  Constant Nutritional Status BMI of 25 - 29 = overweight Nutritional Status Detail appetite is better per patient  Have you ever been in a relationship where you felt threatened, hurt or afraid?No   Does patient need assistance? Functional Status Self care Ambulation Normal   Physical Exam  General:  alert and well-developed.   Eyes:  vision grossly intact and pupils equal.   Mouth:  fair dentition.  no thrush Neck:  supple.   Lungs:  normal respiratory effort and normal breath sounds.   Heart:  normal rate and regular rhythm.   Abdomen:  soft and non-tender.   Msk:  r shoulder with healed scar, limited rom due to pain Neurologic:  alert & oriented X3 and cranial nerves II-XII intact.          Medication Adherence: 03/11/2010   Adherence to medications reviewed with patient. Counseling to provide adequate adherence provided   Prevention For Positives: 03/11/2010   Safe sex practices discussed with patient. Condoms offered.                             Impression & Recommendations:  Problem # 1:  HIV DISEASE (ICD-042)  He  restarted ARVs jan 2011  with  reyataz  norvir and truvada and is doing ok with a nice decrease in his VL  but some intolerance and great difficulty swallowing the reyataz capsule due to size (has missed a number of meds.  I discussed extensively and will d/c reyataz  and norvir and start raltegravir. Continue truvada.    Check BW today and follow up in one month with Zenaida Niece dam.  I advised how important 100% compliance is and he thinks he can do it.   Diagnostics Reviewed:  HIV: CDC-defined AIDS (05/16/2008)   CD4: 300 (12/31/2009)   WBC: 5.6 (12/30/2009)   Hgb: 12.3 (12/30/2009)   HCT: 38.0 (12/30/2009)   Platelets: 201 (12/30/2009) HIV genotype: See Comment (02/25/2009)   HIV-1 RNA: 508 (12/30/2009)   HBSAg: NO (12/19/2006)  Problem # 2:  FRACTURE, HUMERUS (ICD-812.20) Will refer back to Dr Carola Frost.   He will sign pain contract for 100 percocet a week.  He has a substance abuse histroy but is clean now except for occas thc.  He has real reason for shoulder pain that has not been amendable to surgery.  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  following with GI  Problem # 6:  ASTHMA, UNSPECIFIED, UNSPECIFIED STATUS (ICD-493.90) well controlled His updated medication list for this problem includes:    Proventil Hfa 108 (90 Base) Mcg/act Aers (Albuterol sulfate) ..... One to two puffs q 6 hours as needed  Medications Added to Medication List This Visit: 1)  Isentress 400 Mg Tabs (Raltegravir potassium) .... One by mouth two times a day  take this in place of norvir and reyataz.  Other Orders: Est. Patient Level IV (95621) Future Orders: T-CD4SP ([facility]) (CD4SP) ... 04/22/2010 T-HIV Viral Load [phone]) ... 04/22/2010 T-CBC w/Diff ([id number]) ... 04/22/2010 T-Comprehensive Metabolic Panel [id number]) ... 04/22/2010  Patient Instructions: 1)  Stop norvir and reyataz. 2)  Start raltegravir twice a day. Continue truvada once a day 3)  Follow up with Zenaida Niece dam or hatcher in 8 weeks  4)  Be sure to return for lab work one (1) week before your next  appointment as scheduled. 5)  Keep appointment with Dr Carola Frost  Prescriptions: ISENTRESS 400 MG TABS (RALTEGRAVIR POTASSIUM) one by mouth two times a day  Take this in place of norvir and reyataz.  #60 x 11   Entered and Authorized by:   Clydie Braun MD   Signed by:   Clydie Braun MD on 03/11/2010   Method used:   Electronically to        [facility] (retail)       [address]       [place], [place]  [address]       Ph: [phone]       Fax: [phone]   RxID:   [id number] VALTREX 1 GM TABS (VALACYCLOVIR HCL) one by mouth three times a day  #30 x 3   Entered and Authorized by:   Clydie Braun MD   Signed by:   Clydie Braun MD on 03/11/2010   Method used:   Electronically to        [facility] (retail)       [address]       [place], [place]  [address]       Ph: [phone]       Fax: [phone]   RxID:   [id number] CLOBETASOL PROPIONATE 0.05 % SOLN (CLOBETASOL PROPIONATE) apply to affect scalp two times a day as needed  #1 x 3   Entered and Authorized by:   Clydie Braun MD   Signed by:   Clydie Braun MD on 03/11/2010   Method used:   Electronically to        [facility] (retail)       [address]       [place], [place]  [id number]       Ph: [phone]       Fax: [phone]   RxID:   [id number] TRUVADA 200-300 MG TABS (EMTRICITABINE-TENOFOVIR) one by mouth once daily  #31 x 3   Entered  and Authorized by:   Clydie Braun MD   Signed by:   Clydie Braun MD on 03/11/2010   Method used:   Electronically to        [facility] (retail)       [address]       [place], [place]  [address]       Ph: [phone]       Fax: [phone]   RxID:   [id number] AMLACTIN 12 % CREA (AMMONIUM LACTATE) apply as needed  #140 x 3   Entered and Authorized by:   Clydie Braun MD   Signed by:   Clydie Braun MD on  03/11/2010   Method used:   Electronically to        [facility] (retail)       [address]       [place], [place]  [id number]       Ph: [phone]       Fax: [phone]   RxID:   [id number] PROVENTIL HFA 108 (90 BASE) MCG/ACT  AERS (ALBUTEROL SULFATE) one to two puffs q 6 hours as needed  #1 month x 11   Entered and Authorized by:   Clydie Braun MD   Signed by:   Clydie Braun MD on 03/11/2010   Method used:   Electronically to        [facility] (retail)       [address]       [place], [place]  [address]       Ph: [phone]       Fax: [phone]   RxID:   [id number] PROVENTIL HFA 108 (90 BASE) MCG/ACT  AERS (ALBUTEROL SULFATE) one to two puffs q 6 hours as needed  #1 month x 11   Entered and Authorized by:   Clydie Braun MD   Signed by:   Clydie Braun MD on 03/11/2010   Method used:   Electronically to        [id number] (retail)       [address]       [place], [place]  [address]       Ph: [phone] or [phone]       Fax: [phone]   RxID:   [id number] VALTREX 1 GM TABS (VALACYCLOVIR HCL) one by mouth three times a day  #30 x 3   Entered and Authorized by:   Clydie Braun MD   Signed by:   Clydie Braun MD on 03/11/2010   Method used:   Electronically to        [id number] ([facility]       [address]       [place], [place]  [address]       Ph: [phone] or [phone]       Fax: [phone]   RxID:   [id number] PERCOCET 5-325 MG TABS (OXYCODONE-ACETAMINOPHEN) one by mouth q 8 hrous as needed for pain  #100 x 0   Entered and Authorized by:   Clydie Braun MD   Signed by:   Clydie Braun MD on 03/11/2010   Method used:   Print then Give to Patient   RxID:   [id number] ISENTRESS 400 MG TABS (RALTEGRAVIR POTASSIUM) one by mouth two times a day  Take this in place of norvir and  reyataz.  #60 x  11   Entered and Authorized by:   Clydie Braun MD   Signed by:   Clydie Braun MD on 03/11/2010   Method used:   Print then Give to Patient   RxID:   [id number]

## 2010-11-25 NOTE — Procedures (Signed)
Summary: Upper Endoscopy  Patient: Benjamin Herrera Note: All result statuses are Final unless otherwise noted.  Tests: (1) Upper Endoscopy (EGD)   EGD Upper Endoscopy       DONE     [facility]     [address] [place], [place]  [address]           ENDOSCOPY PROCEDURE REPORT           PATIENT:  Benjamin Herrera, Benjamin Herrera  MR#:  [id number]     BIRTHDATE:  [birth year], 44 yrs. old  GENDER:  male           ENDOSCOPIST:  Benjamin Hair. Arlyce Dice, MD     Referred by:           PROCEDURE DATE:  08/24/2010     PROCEDURE:  EGD w/botox injection     ASA CLASS:  Class II     INDICATIONS:  dysphagia h/o esophageal spasm           MEDICATIONS:   Fentanyl 100 mcg IV, Versed 10 mg IV     TOPICAL ANESTHETIC:  Cetacaine Spray           DESCRIPTION OF PROCEDURE:   After the risks benefits and     alternatives of the procedure were thoroughly explained, informed     consent was obtained.  The Pentax Gastroscope [id number] endoscope     was introduced through the mouth and advanced to the second     portion of the duodenum, without limitations.  The instrument was     slowly withdrawn as the mucosa was fully examined.           The upper, middle, and distal third of the esophagus were     carefully inspected and no abnormalities were noted. The z-line     was well seen at the GEJ. The endoscope was pushed into the fundus     which was normal including a retroflexed view. The antrum,gastric     body, first and second part of the duodenum were unremarkable.     botox injection 10 units (1cc) injected into each quadrant     submucosally at the GE junction    Retroflexed views revealed no     abnormalities.    The scope was then withdrawn from the patient     and the procedure completed.           COMPLICATIONS:  None           ENDOSCOPIC IMPRESSION:     1) Normal EGD - s/p botox injection     RECOMMENDATIONS:     1) Call office next 2-3 days to schedule an office appointment     for 1 month         REPEAT EXAM:  No           ______________________________     Benjamin Hair. Arlyce Dice, MD           CC:  Benjamin Braun, MD           n.     Benjamin Herrera:   Benjamin Herrera at 08/24/2010 09:04 AM           Benjamin Herrera, [id number]  Note: An exclamation mark (!) indicates a result that was not dispersed into the flowsheet. Document Creation Date: 08/24/2010 9:05 AM _______________________________________________________________________  (1) Order result status: Final Collection or observation date-time: 08/24/2010 09:01 Requested date-time:  Receipt date-time:  Reported  date-time:  Referring Physician:   Ordering Physician: Benjamin Herrera [phone]) Specimen Source:  Source: Benjamin Herrera Order Number: [id number] Lab site:

## 2010-11-25 NOTE — Consult Note (Signed)
Summary: [facility]  [facility]   Imported By: Lennie Odor 11/27/2009 14:22:12  _____________________________________________________________________  External Attachment:    Type:   Image     Comment:   External Document

## 2010-11-25 NOTE — Consult Note (Signed)
[facility]   Imported By: Florinda Marker 04/24/2010 15:13:59  _____________________________________________________________________  External Attachment:    Type:   Image     Comment:   External Document

## 2010-11-26 NOTE — Assessment & Plan Note (Signed)
Summary: HOSP PROCEDURE 3M FU/YF   History of Present Illness Visit Type: Follow-up Visit Primary GI MD: Benjamin Heaps MD [facility] Primary Provider: Johny Sax MD Requesting Provider: na Chief Complaint: Hosp f/u from EGD. Pt c/o epigastric pain  History of Present Illness:   Benjamin Herrera has returned following Botox injection of his LES.  He has a nonspecific motility disorder.  He reports significant improvement in his dysphagia.  His main complaint at present is sharp upper epigastric pain that may last for several  seconds at a time.  He was seen 2 months ago in the ER where lab work including CBC, comprehensive metabolic profile and ultrasound were unremarkable.   GI Review of Systems    Reports abdominal pain.     Location of  Abdominal pain: epigastric area.    Denies acid reflux, belching, bloating, chest pain, dysphagia with liquids, dysphagia with solids, heartburn, loss of appetite, nausea, vomiting, vomiting blood, weight loss, and  weight gain.        Denies anal fissure, black tarry stools, change in bowel habit, constipation, diarrhea, diverticulosis, fecal incontinence, heme positive stool, hemorrhoids, irritable bowel syndrome, jaundice, light color stool, liver problems, rectal bleeding, and  rectal pain.    Current Medications (verified): 1)  Proventil Hfa 108 (90 Base) Mcg/act  Aers (Albuterol Sulfate) .... One To Two Puffs Q 6 Hours As Needed 2)  Amlactin 12 % Crea (Ammonium Lactate) .... Apply As Needed 3)  Truvada 200-300 Mg Tabs (Emtricitabine-Tenofovir) .... One By Mouth Once Daily 4)  Percocet 5-325 Mg Tabs (Oxycodone-Acetaminophen) .... One By Mouth Q 8 Hrous As Needed For Pain 5)  Valtrex 1 Gm Tabs (Valacyclovir Hcl) .... One By Mouth Three Times A Day 6)  Isentress 400 Mg Tabs (Raltegravir Potassium) .... Take 1 Tablet By Mouth Two Times A Day 7)  Atenolol 25 Mg Tab (Atenolol) .... Take One (1) Tablet By Mouth Daily  Allergies (verified): No Known Drug  Allergies  Past History:  Past Medical History: Humeral fracture - s/p surg by Dr Roney Mans HIV disease - dxed in 1993.  Had developed Hep A and was dxed at that time.     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 s/p R shoulder replacement x 2  - most recent March of 08  Family History: Reviewed history from 08/31/2010 and no changes required. Family History of CAD Male 1st degree relative <60 No FH of Colon Cancer: Family History Hypertension  Social History: Reviewed history from 06/15/2010 and no changes required. 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 Family does not know his status Lives alone  Review of Systems       The patient complains of fatigue.  The patient denies allergy/sinus, anemia, anxiety-new, arthritis/joint pain, back pain, blood in urine, breast changes/lumps, change in vision, confusion, cough, coughing up blood, depression-new, fainting, fever, headaches-new, hearing problems, heart murmur, heart rhythm changes, itching, muscle pains/cramps, night sweats, nosebleeds, shortness of breath, skin rash, sleeping problems, sore  throat, swelling of feet/legs, swollen lymph glands, thirst - excessive, urination - excessive, urination changes/pain, urine leakage, vision changes, and voice  change.    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  Recommendations #1 trial of hyomax  Patient Instructions: 1)  Copy sent to : Benjamin Sax MD 2)  YOur prescriptions have been printed and given to you 3)  The medication list was reviewed and reconciled.  All changed / newly prescribed medications were explained.  A complete medication list was provided to the patient / caregiver. Prescriptions: HYOMAX-DT 0.375 MG CR-TABS (HYOSCYAMINE SULFATE) take one tab b.i.d. for 4 days then as needed  #20 x 2   Entered and Authorized by:   Benjamin Meckel MD   Signed by:   Benjamin Meckel MD on 09/25/2010   Method used:   Print then Give to Patient   RxID:   [id number] PERCOCET 5-325 MG TABS (OXYCODONE-ACETAMINOPHEN) take 1-2 tabs every 6 hours as needed  #20 x 0   Entered and Authorized by:   Benjamin Meckel MD   Signed by:   Benjamin Meckel MD on 09/25/2010   Method used:   Print then Give to Patient   RxID:   [id number]

## 2010-11-26 NOTE — Progress Notes (Signed)
Summary: Medication  Phone Note Call from Patient Call back at Home Phone [phone]   Caller: Patient Call For: Dr. Arlyce Dice Reason for Call: Talk to Nurse Summary of Call: Pt is calling about medication again Initial call taken by: [place] Johnson,  October 09, 2010 1:15 PM  Follow-up for Phone Call        Called pt to inform med sent to pharmacy Follow-up by: Merri Ray CMA Duncan Dull),  October 09, 2010 1:40 PM    New/Updated Medications: ROBINUL-FORTE 2 MG TABS (GLYCOPYRROLATE) 1 two times a day as needed Prescriptions: ROBINUL-FORTE 2 MG TABS (GLYCOPYRROLATE) 1 two times a day as needed  #30 x 1   Entered by:   Merri Ray CMA (AAMA)   Authorized by:   [name] MD   Signed by:   Merri Ray CMA (AAMA) on 10/09/2010   Method used:   [id number]       [address]       [place], [place]  [address]       Ph: [phone] or [phone]       Fax: [phone]   RxID:   [id number]

## 2010-11-26 NOTE — Progress Notes (Signed)
Summary: requesting Percocet  Phone Note Call from Patient   Caller: Patient Summary of Call: pt. requesting refill for Percocet.  Sent Dr. Ninetta Lights a flag.  Has pain clinic appt. 11/16/09 Received #20 Percocet 09/25/10 Initial call taken by: Wendall Mola CMA Duncan Dull),  October 01, 2010 9:06 AM     Appended Document: requesting Percocet he must clear this with the pain clinic. if hegets RX's from sources outside the pain clinic they could dismiss him.   Appended Document: requesting Percocet Pt. called about Percocet, notified pt. that Dr.Stallings filled an RX for Robinul on 10/09/10 #30 with 1 RF, this should last until pain clinic appt. 11/16/10  Appended Document: requesting Percocet correction Dr. Arlyce Dice

## 2010-11-26 NOTE — Miscellaneous (Signed)
Summary: Orders Update - labs  Clinical Lists Changes  Orders: Added new Test order of T-CBC w/Diff [phone]) - Signed Added new Test order of T-CD4SP [facility]) (CD4SP) - Signed Added new Test order of T-Comprehensive Metabolic Panel [id number]) - Signed Added new Test order of T-HIV Viral Load [phone]) - Signed     Process Orders Check Orders Results:     [facility]: ABN not required for this insurance Order queued for requisitioning for [facility]: September 30, 2010 4:43 PM  Tests Sent for requisitioning (September 30, 2010 4:43 PM):     09/30/2010: [facility] -- T-CBC w/Diff [[id number]] (signed)     09/30/2010: [facility] -- T-Comprehensive Metabolic Panel [[id number]] (signed)     09/30/2010: [facility] -- T-HIV Viral Load [phone] (signed)

## 2010-12-14 ENCOUNTER — Encounter: Payer: Medicaid Other | Attending: Physical Medicine & Rehabilitation

## 2010-12-14 ENCOUNTER — Ambulatory Visit: Payer: Self-pay | Admitting: Physical Medicine & Rehabilitation

## 2011-01-04 LAB — T-HELPER CELL (CD4) - (RCID CLINIC ONLY): CD4 % Helper T Cell: 10 % — ABNORMAL LOW (ref 33–55)

## 2011-01-07 LAB — DIFFERENTIAL
Basophils Absolute: 0 K/uL (ref 0.0–0.1)
Basophils Relative: 0 % (ref 0–1)
Eosinophils Absolute: 0.2 10*3/uL (ref 0.0–0.7)
Eosinophils Relative: 4 % (ref 0–5)
Lymphocytes Relative: 44 % (ref 12–46)
Lymphs Abs: 1.7 10*3/uL (ref 0.7–4.0)
Monocytes Absolute: 0.3 10*3/uL (ref 0.1–1.0)
Monocytes Relative: 8 % (ref 3–12)
Neutro Abs: 1.7 10*3/uL (ref 1.7–7.7)
Neutrophils Relative %: 44 % (ref 43–77)

## 2011-01-07 LAB — URINALYSIS, ROUTINE W REFLEX MICROSCOPIC
Glucose, UA: NEGATIVE mg/dL
Ketones, ur: 15 mg/dL — AB
Leukocytes, UA: NEGATIVE
Nitrite: NEGATIVE
Protein, ur: 30 mg/dL — AB
Specific Gravity, Urine: 1.028 (ref 1.005–1.030)
Urobilinogen, UA: 1 mg/dL (ref 0.0–1.0)
pH: 6 (ref 5.0–8.0)

## 2011-01-07 LAB — CBC
HCT: 39.8 % (ref 39.0–52.0)
Hemoglobin: 13.3 g/dL (ref 13.0–17.0)
MCH: 26.1 pg (ref 26.0–34.0)
MCHC: 33.4 g/dL (ref 30.0–36.0)
MCV: 78 fL (ref 78.0–100.0)
Platelets: 153 K/uL (ref 150–400)
RBC: 5.1 MIL/uL (ref 4.22–5.81)
RDW: 15.7 % — ABNORMAL HIGH (ref 11.5–15.5)
WBC: 3.9 K/uL — ABNORMAL LOW (ref 4.0–10.5)

## 2011-01-07 LAB — POCT I-STAT, CHEM 8
BUN: 6 mg/dL (ref 6–23)
Calcium, Ion: 1.15 mmol/L (ref 1.12–1.32)
Chloride: 106 mEq/L (ref 96–112)
Creatinine, Ser: 1 mg/dL (ref 0.4–1.5)
Glucose, Bld: 86 mg/dL (ref 70–99)
HCT: 44 % (ref 39.0–52.0)
Hemoglobin: 15 g/dL (ref 13.0–17.0)
Potassium: 3.6 mEq/L (ref 3.5–5.1)
Sodium: 143 meq/L (ref 135–145)
TCO2: 27 mmol/L (ref 0–100)

## 2011-01-07 LAB — URINE MICROSCOPIC-ADD ON

## 2011-01-07 LAB — HEPATIC FUNCTION PANEL
ALT: 18 U/L (ref 0–53)
AST: 23 U/L (ref 0–37)
Albumin: 3.4 g/dL — ABNORMAL LOW (ref 3.5–5.2)
Alkaline Phosphatase: 57 U/L (ref 39–117)
Bilirubin, Direct: 0.2 mg/dL (ref 0.0–0.3)
Indirect Bilirubin: 0.2 mg/dL — ABNORMAL LOW (ref 0.3–0.9)
Total Bilirubin: 0.4 mg/dL (ref 0.3–1.2)
Total Protein: 7.4 g/dL (ref 6.0–8.3)

## 2011-01-07 LAB — POCT CARDIAC MARKERS
CKMB, poc: <1 ng/mL — ABNORMAL LOW (ref 1.0–8.0)
Myoglobin, poc: 48.7 ng/mL (ref 12–200)
Troponin i, poc: <0.05 ng/mL (ref 0.00–0.09)

## 2011-01-07 LAB — LIPASE, BLOOD: Lipase: 32 U/L (ref 11–59)

## 2011-01-14 ENCOUNTER — Emergency Department (HOSPITAL_COMMUNITY): Payer: Medicaid Other

## 2011-01-14 ENCOUNTER — Telehealth: Payer: Self-pay | Admitting: Gastroenterology

## 2011-01-14 ENCOUNTER — Telehealth: Payer: Self-pay | Admitting: *Deleted

## 2011-01-14 ENCOUNTER — Emergency Department (HOSPITAL_COMMUNITY)
Admission: EM | Admit: 2011-01-14 | Discharge: 2011-01-14 | Disposition: A | Discharge status: Home / Self Care | Payer: Medicaid Other | Attending: Emergency Medicine | Admitting: Emergency Medicine

## 2011-01-14 DIAGNOSIS — R1013 Epigastric pain: Secondary | ICD-10-CM | POA: Insufficient documentation

## 2011-01-14 DIAGNOSIS — R112 Nausea with vomiting, unspecified: Secondary | ICD-10-CM | POA: Insufficient documentation

## 2011-01-14 DIAGNOSIS — R142 Eructation: Secondary | ICD-10-CM | POA: Insufficient documentation

## 2011-01-14 DIAGNOSIS — R1032 Left lower quadrant pain: Secondary | ICD-10-CM

## 2011-01-14 DIAGNOSIS — B20 Human immunodeficiency virus [HIV] disease: Secondary | ICD-10-CM | POA: Insufficient documentation

## 2011-01-14 DIAGNOSIS — R141 Gas pain: Secondary | ICD-10-CM | POA: Insufficient documentation

## 2011-01-14 DIAGNOSIS — G8929 Other chronic pain: Secondary | ICD-10-CM | POA: Insufficient documentation

## 2011-01-14 LAB — CBC
HCT: 42.7 % (ref 39.0–52.0)
RBC: 5.53 MIL/uL (ref 4.22–5.81)
RDW: 15.1 % (ref 11.5–15.5)

## 2011-01-14 LAB — URINE MICROSCOPIC-ADD ON

## 2011-01-14 LAB — COMPREHENSIVE METABOLIC PANEL
ALT: 19 U/L (ref 0–53)
AST: 28 U/L (ref 0–37)
Alkaline Phosphatase: 77 U/L (ref 39–117)
GFR calc Af Amer: >60 mL/min (ref >60)
Glucose, Bld: 119 mg/dL — ABNORMAL HIGH (ref 70–99)
Potassium: 4.2 mEq/L (ref 3.5–5.1)
Sodium: 140 mEq/L (ref 135–145)
Total Protein: 8.3 g/dL (ref 6.0–8.3)

## 2011-01-14 LAB — URINALYSIS, ROUTINE W REFLEX MICROSCOPIC
Bilirubin Urine: NEGATIVE
Glucose, UA: NEGATIVE mg/dL
Ketones, ur: NEGATIVE mg/dL
Nitrite: NEGATIVE
pH: 7 (ref 5.0–8.0)

## 2011-01-14 LAB — DIFFERENTIAL
Eosinophils Absolute: 0.2 10*3/uL (ref 0.0–0.7)
Eosinophils Relative: 2 % (ref 0–5)
Lymphs Abs: 2 10*3/uL (ref 0.7–4.0)
Monocytes Absolute: 0.7 10*3/uL (ref 0.1–1.0)
Monocytes Relative: 7 % (ref 3–12)
Neutrophils Relative %: 69 % (ref 43–77)

## 2011-01-14 LAB — LIPASE, BLOOD: Lipase: 40 U/L (ref 11–59)

## 2011-01-14 NOTE — Telephone Encounter (Signed)
Dr. Clelia Croft from the [facility] called from the ED. Pt is there for c/o abd pain. Asked that he get an appt with his GI, Dr. Darrel Hoover or [place]. States he goes to ED for this chronic problem of abd pain & should see mds. Pt has appt here 02/15/11. I called Dr. Marzetta Board office & made appt 02/26/11 at 2:45. We will need a referral to send to Dr. Marzetta Board ever though he is an established pt there. He has medicaid.  Arlyce Dice ofc [phone], fx [phone]  Will send the referral when done.

## 2011-01-14 NOTE — Telephone Encounter (Signed)
Spoke with pt and he states he went to the ER today for severe abdominal pain. Pt states that it was recommended that he have an EGD and a colon. Pt would like to schedule these. Dr. Arlyce Dice please advise.

## 2011-01-15 NOTE — Telephone Encounter (Signed)
Pt needs an OV ASAP

## 2011-01-15 NOTE — Telephone Encounter (Signed)
Appt made with Willette Cluster NP for 01/19/11@3 :30pm. Pt aware of appt date and time.

## 2011-01-15 NOTE — Telephone Encounter (Signed)
Pt needs OV 

## 2011-01-15 NOTE — Telephone Encounter (Signed)
Pt needs OV ASAP

## 2011-01-17 LAB — T-HELPER CELL (CD4) - (RCID CLINIC ONLY)
CD4 % Helper T Cell: 10 % — ABNORMAL LOW (ref 33–55)
CD4 T Cell Abs: 300 uL — ABNORMAL LOW (ref 400–2700)

## 2011-01-19 ENCOUNTER — Encounter: Payer: Self-pay | Admitting: Nurse Practitioner

## 2011-01-19 ENCOUNTER — Ambulatory Visit (INDEPENDENT_AMBULATORY_CARE_PROVIDER_SITE_OTHER): Payer: Medicaid Other | Admitting: Nurse Practitioner

## 2011-01-19 DIAGNOSIS — R1310 Dysphagia, unspecified: Secondary | ICD-10-CM

## 2011-01-19 DIAGNOSIS — R1032 Left lower quadrant pain: Secondary | ICD-10-CM | POA: Insufficient documentation

## 2011-01-19 NOTE — Patient Instructions (Signed)
We have scheduled the  CT scan at [facility] [address]. On 01-21-2011. Directions and contrast provided. We will call you with the results. We are making you an appointment to follow up with Dr [name] here at our office in 6 weeks. We will call you with the appointment.

## 2011-01-19 NOTE — Progress Notes (Signed)
   History of Present Illness: [name] is a  45 year old black male known to Dr. Arlyce Herrera for a history of dysphagia, esophageal stricture and upper abdominal pain. He underwent EGD (normal exam) with Botox injection late October 2011. Still having some dysphagia, mainly to liquids but overall doing okay. Patient comes in today for abdominal pain which seems to originate in LLQ and radiate upward. He has had this pain for years but worse lately and most recent episode was associated with nausea and vomiting (new). Pain occuring now about 1-2 times a month, usually lasts a day or so. No aggravating or alleviating factors.The last episode was approx. 5 days ago when he went to ER. According to the ER note, pain was originating in epigastrium. He was seen by [place] in Dec. 2011 for epigastric pain as well but clearly explains that his current pain seems to originate in LLQ. Abdominal films revealed midly dilated small bowel loops with multiple air fluid levels. His CBC was normal except for low MCV of 77. CMET and lipase were normal. Since ER visit patient has had some milder episodes of pain. Bowel movements are normal. No rectal bleeding. No fevers. Appetite is good. Stopped taking Robinul, it didn't help.   Current Medications, Allergies, Past Medical History, Past Surgical History, Family History and Social History were reviewed in [employer] record.   Physical Exam: General: Well developed black male in no acute distress Head: Normocephalic and atraumatic Eyes:  sclerae anicteric,conjunctive pink. Ears: Normal auditory acuity Mouth: No deformity or lesions Neck: Supple, no masses.  Lungs: Clear throughout to auscultation Heart: Regular rate and rhythm; no murmurs heard Abdomen: Soft, non tender, very mildly distended.No masses or hepatomegaly noted. Normal Bowel sounds Rectal: Not done Musculoskeletal: Symmetrical with no gross deformities  Skin: No lesions on visible  extremities Extremities: No edema or deformities noted Neurological: Alert oriented x 4, grossly nonfocal Cervical Nodes:  No significant cervical adenopathy Psychological:  Alert and cooperative. Normal mood and affect   Assessment and Plan: DYSPHAGIA UNSPECIFIED Remote esophageal stricture but most recent EGD Oct. 2011 was normal though patient did undergo Botox injection. Overall his dysphagia is better though still has some problems with liquids which would speak to being more oropharyngeal in nature.   Abdominal pain, left lower quadrant Chronic diffuse abdominal pain which seems to originate in LLQ and recently associated with nausea and vomiting. Labs normal, no weight loss but abdominal films show some mildly dilated small bowel loops with air fluid levels. He may of had an ileus. Difficult to sort out as patient describes pain as chronic but previous office visits and ER visits document epigastric pain. Though done for unrelated reason, patient had an unremarkable EGD Oct 2011. For further evaluation will obtain CTscan of the abdomen and pelvis.

## 2011-01-20 ENCOUNTER — Encounter: Payer: Self-pay | Admitting: Nurse Practitioner

## 2011-01-20 DIAGNOSIS — R1314 Dysphagia, pharyngoesophageal phase: Secondary | ICD-10-CM | POA: Insufficient documentation

## 2011-01-20 DIAGNOSIS — R1032 Left lower quadrant pain: Secondary | ICD-10-CM | POA: Insufficient documentation

## 2011-01-20 NOTE — Assessment & Plan Note (Deleted)
Chronic diffuse abdominal pain which seems to originate in LLQ and recently associated with nausea and vomiting. Labs normal, no weight loss but abdominal films show some mildly dilated small bowel loops with air fluid levels. He may of had an ileus. Difficult to sort out as patient describes pain as chronic but previous office visits and ER visits document epigastric pain. Though done for unrelated reason, patient had an unremarkable EGD Oct 2011. For further evaluation will obtain CTscan of the abdomen and pelvis.

## 2011-01-20 NOTE — Assessment & Plan Note (Signed)
Remote esophageal stricture but most recent EGD Oct. 2011 was normal though patient did undergo Botox injection. Overall his dysphagia is better though still has some problems with liquids which would speak to being more oropharyngeal in nature.

## 2011-01-20 NOTE — Assessment & Plan Note (Signed)
Chronic diffuse abdominal pain which seems to originate in LLQ and recently associated with nausea and vomiting. Labs normal, no weight loss but abdominal films show some mildly dilated small bowel loops with air fluid levels. He may of had an ileus. Difficult to sort out as patient describes pain as chronic but previous office visits and ER visits document epigastric pain. Though done for unrelated reason, patient had an unremarkable EGD Oct 2011. For further evaluation will obtain CTscan of the abdomen and pelvis.   

## 2011-01-21 ENCOUNTER — Ambulatory Visit (INDEPENDENT_AMBULATORY_CARE_PROVIDER_SITE_OTHER)
Admission: RE | Admit: 2011-01-21 | Discharge: 2011-01-21 | Disposition: A | Discharge status: Home / Self Care | Payer: Medicaid Other | Source: Ambulatory Visit | Attending: Gastroenterology | Admitting: Gastroenterology

## 2011-01-21 DIAGNOSIS — R1032 Left lower quadrant pain: Secondary | ICD-10-CM

## 2011-01-21 MED ORDER — IOHEXOL 300 MG/ML  SOLN
100.0000 mL | Freq: Once | INTRAMUSCULAR | Status: AC | PRN
  Administered 2011-01-21: 100 mL via INTRAVENOUS

## 2011-01-21 NOTE — Progress Notes (Signed)
I agree with the plan outlined in this note 

## 2011-01-21 NOTE — Telephone Encounter (Signed)
Dr. Clelia Croft from the [facility] called from the ED. Pt is there for c/o abd pain. Asked that he get an appt with his GI, Dr. Darrel Hoover or [place]. States he goes to ED for this chronic problem of abd pain & should see mds. Pt has appt here 02/15/11. I called Dr. Marzetta Board office & made appt 02/26/11 at 2:45. We will need a referral to send to Dr. Marzetta Board ever though he is an established pt there. He has medicaid.  Arlyce Dice ofc [phone], fx [phone] .Faustino Congress, RN

## 2011-01-22 ENCOUNTER — Telehealth: Payer: Self-pay | Admitting: *Deleted

## 2011-01-22 NOTE — Telephone Encounter (Signed)
Message copied by Jesse Fall on Fri Jan 22, 2011 10:41 AM ------      Message from: Willette Cluster      Created: Fri Jan 22, 2011  9:36 AM       No findings to explain his abdominal pain. Esophageal findings note but patient just had EGD last October 2011 with Dr. Arlyce Dice. Rene Kocher, please let patient know results. He should follow up with Dr. Arlyce Dice if persistent pain. Thanks

## 2011-01-22 NOTE — Telephone Encounter (Signed)
Message copied by Jesse Fall on Fri Jan 22, 2011  2:36 PM ------      Message from: Willette Cluster      Created: Fri Jan 22, 2011  9:36 AM       No findings to explain his abdominal pain. Esophageal findings note but patient just had EGD last October 2011 with Dr. Arlyce Dice. Rene Kocher, please let patient know results. He should follow up with Dr. Arlyce Dice if persistent pain. Thanks

## 2011-01-22 NOTE — Telephone Encounter (Signed)
Patient given results and recommendations as per Willette Cluster, NP

## 2011-01-22 NOTE — Telephone Encounter (Signed)
Left a message for patient to call me. 

## 2011-01-25 LAB — COMPREHENSIVE METABOLIC PANEL
AST: 19 U/L (ref 0–37)
Albumin: 3.5 g/dL (ref 3.5–5.2)
Alkaline Phosphatase: 58 U/L (ref 39–117)
BUN: 11 mg/dL (ref 6–23)
Chloride: 107 mEq/L (ref 96–112)
GFR calc Af Amer: >60 mL/min (ref >60)
Potassium: 4 mEq/L (ref 3.5–5.1)
Total Bilirubin: 0.4 mg/dL (ref 0.3–1.2)
Total Protein: 7.1 g/dL (ref 6.0–8.3)

## 2011-01-25 LAB — URINALYSIS, ROUTINE W REFLEX MICROSCOPIC
Ketones, ur: NEGATIVE mg/dL
Nitrite: NEGATIVE
Protein, ur: NEGATIVE mg/dL
Urobilinogen, UA: 1 mg/dL (ref 0.0–1.0)

## 2011-01-25 LAB — CBC
Platelets: 138 10*3/uL — ABNORMAL LOW (ref 150–400)
RDW: 16.8 % — ABNORMAL HIGH (ref 11.5–15.5)
WBC: 4.4 10*3/uL (ref 4.0–10.5)

## 2011-01-25 LAB — DIFFERENTIAL
Eosinophils Absolute: 0.1 10*3/uL (ref 0.0–0.7)
Eosinophils Relative: 3 % (ref 0–5)
Lymphocytes Relative: 55 % — ABNORMAL HIGH (ref 12–46)
Lymphs Abs: 2.4 10*3/uL (ref 0.7–4.0)
Monocytes Absolute: 0.3 10*3/uL (ref 0.1–1.0)
Monocytes Relative: 6 % (ref 3–12)

## 2011-01-26 ENCOUNTER — Telehealth: Payer: Self-pay | Admitting: *Deleted

## 2011-01-26 NOTE — Telephone Encounter (Addendum)
Message copied by Lowry Ram on Tue Jan 26, 2011 11:22 AM ------      Message from: [place], [place]      Created: Tue Jan 19, 2011  4:48 PM      Regarding: Appt for pt with Dr Arlyce Dice       Pt needs to see this pt for a 6 week follow up.  Week of May 7 or May 14.  Pt has an appointment with Dr. Arlyce Dice on 02-26-2011 at 2:45 PM.

## 2011-01-27 ENCOUNTER — Other Ambulatory Visit (INDEPENDENT_AMBULATORY_CARE_PROVIDER_SITE_OTHER): Payer: Medicaid Other

## 2011-01-27 DIAGNOSIS — B20 Human immunodeficiency virus [HIV] disease: Secondary | ICD-10-CM

## 2011-01-28 LAB — URINALYSIS, ROUTINE W REFLEX MICROSCOPIC
Protein, ur: NEGATIVE mg/dL
Specific Gravity, Urine: 1.022 (ref 1.005–1.030)
pH: 6 (ref 5.0–8.0)

## 2011-01-28 LAB — CBC WITH DIFFERENTIAL/PLATELET
Basophils Absolute: 0 10*3/uL (ref 0.0–0.1)
Eosinophils Relative: 4 % (ref 0–5)
HCT: 43.8 % (ref 39.0–52.0)
Lymphocytes Relative: 55 % — ABNORMAL HIGH (ref 12–46)
Lymphs Abs: 3.2 10*3/uL (ref 0.7–4.0)
MCV: 78.4 fL (ref 78.0–100.0)
Neutro Abs: 2 10*3/uL (ref 1.7–7.7)
Platelets: 188 10*3/uL (ref 150–400)
RBC: 5.59 MIL/uL (ref 4.22–5.81)
RDW: 15.6 % — ABNORMAL HIGH (ref 11.5–15.5)
WBC: 5.9 10*3/uL (ref 4.0–10.5)

## 2011-01-28 LAB — COMPREHENSIVE METABOLIC PANEL
ALT: 17 U/L (ref 0–53)
CO2: 22 mEq/L (ref 19–32)
Calcium: 9.6 mg/dL (ref 8.4–10.5)
Chloride: 104 mEq/L (ref 96–112)
Creat: 1.24 mg/dL (ref 0.40–1.50)
Glucose, Bld: 68 mg/dL — ABNORMAL LOW (ref 70–99)
Total Bilirubin: 0.4 mg/dL (ref 0.3–1.2)

## 2011-01-28 LAB — LIPID PANEL
HDL: 29 mg/dL — ABNORMAL LOW (ref >39)
Total CHOL/HDL Ratio: 6.2 Ratio
Triglycerides: 230 mg/dL — ABNORMAL HIGH (ref <150)

## 2011-01-28 LAB — URINALYSIS, MICROSCOPIC ONLY
Bacteria, UA: NONE SEEN
Casts: NONE SEEN
Crystals: NONE SEEN

## 2011-01-30 LAB — HIV-1 RNA QUANT-NO REFLEX-BLD
HIV 1 RNA Quant: 140 copies/mL — ABNORMAL HIGH (ref <20)
HIV-1 RNA Quant, Log: 2.15 {Log} — ABNORMAL HIGH (ref <1.30)

## 2011-02-03 LAB — T-HELPER CELL (CD4) - (RCID CLINIC ONLY): CD4 % Helper T Cell: 10 % — ABNORMAL LOW (ref 33–55)

## 2011-02-08 LAB — POCT RAPID STREP A (OFFICE): Streptococcus, Group A Screen (Direct): NEGATIVE

## 2011-02-15 ENCOUNTER — Ambulatory Visit: Payer: Medicaid Other | Admitting: Infectious Diseases

## 2011-02-17 ENCOUNTER — Ambulatory Visit: Payer: Medicaid Other | Admitting: Infectious Diseases

## 2011-02-26 ENCOUNTER — Encounter: Payer: Self-pay | Admitting: Gastroenterology

## 2011-02-26 ENCOUNTER — Ambulatory Visit (INDEPENDENT_AMBULATORY_CARE_PROVIDER_SITE_OTHER): Payer: Medicaid Other | Admitting: Gastroenterology

## 2011-02-26 DIAGNOSIS — R1013 Epigastric pain: Secondary | ICD-10-CM

## 2011-02-26 DIAGNOSIS — R109 Unspecified abdominal pain: Secondary | ICD-10-CM

## 2011-02-26 DIAGNOSIS — K220 Achalasia of cardia: Secondary | ICD-10-CM

## 2011-02-26 DIAGNOSIS — R1032 Left lower quadrant pain: Secondary | ICD-10-CM

## 2011-02-26 MED ORDER — PEG-KCL-NACL-NASULF-NA ASC-C 100 G PO SOLR: 1.0000 | Freq: Once | ORAL | Status: AC

## 2011-02-26 NOTE — Assessment & Plan Note (Addendum)
His abdominal pain seems to begin in his lower abdomen. Etiology has not been determined. Symptoms are suggestive of transient obstruction. No small bowel abnormalities were seen by CT scan. Lab is notable for an MCV of 77.  Mediations. #1 colonoscopy. I will  attempt to intubate the terminal ileum if his colon was normal.

## 2011-02-26 NOTE — Assessment & Plan Note (Addendum)
Prior esophageal manometry showing incomplete relaxation of the LES but normal peristaltic contractions of the body.  He has responded well to Botox injections and has gained a significant amount of weight since his last injection in November.  Conditions. #1 upper endoscopy with repeat Botox injection of the LES

## 2011-02-26 NOTE — Assessment & Plan Note (Signed)
See notes under epigastric pain

## 2011-02-26 NOTE — Progress Notes (Signed)
History of Present Illness:  Benjamin Herrera has returned for ongoing evaluation of abdominal pain. About 4 times in the last 4 months he's had episodes of severe, incapacitating lower abdominal pain. The pain at first  is crampy and then continuous. He's had several ER visits with blood work and CT scans;   all been unrevealing. I reviewed his CT scan from last month which suggested some mild thickening of the distal esophagus only. In between episodes he is well. Bowel movements are slightly irregular. He's had no frank bleeding. Lab work  was pertinent for an MCV of 77.  He has  probable early achalasia and has responded well to Botox injection. This was last done in November, 2011. He is now reporting worsening dysphagia to solids and liquids.   Review of Systems: Pertinent positive and negative review of systems were noted in the above HPI section. All other review of systems were otherwise negative.    Current Medications, Allergies, Past Medical History, Past Surgical History, Family History and Social History were reviewed in [employer] electronic medical record  Vital signs were reviewed in today's medical record. Physical Exam: General: Well developed , well nourished, no acute distress

## 2011-02-26 NOTE — Patient Instructions (Addendum)
Colonoscopy A colonoscopy is an exam to evaluate your entire colon. In this exam, your colon is cleansed. A long fiberoptic tube is inserted through your rectum and into your colon. The fiberoptic scope (endoscope) is a long bundle of enclosed and very flexible fibers. These fibers transmit light to the area examined and send images from that area to your caregiver. Discomfort is usually minimal. You may be given a drug to help you sleep (sedative) during or prior to the procedure. This exam helps to detect lumps (tumors), polyps, inflammation, and areas of bleeding. Your caregiver may also take a small piece of tissue (biopsy) that will be examined under a microscope. BEFORE THE PROCEDURE  A clear liquid diet may be required for 2 days before the exam.   Liquid injections (enemas) or laxatives may be required.   A large amount of electrolyte solution may be given to you to drink over a short period of time. This solution is used to clean out your colon.   You should be present 1 prior to your procedure or as directed by your caregiver.   Check in at the admissions desk to fill out necessary forms if not preregistered. There will be consent forms to sign prior to the procedure. If accompanied by friends or family, there is a waiting area for them while you are having your procedure.  LET YOUR CAREGIVER KNOW ABOUT:  Allergies to food or medicine.  Medicines taken, including vitamins, herbs, eyedrops, over-the-counter medicines, and creams.   Use of steroids (by mouth or creams).   Previous problems with anesthetics or numbing medicines.   History of bleeding problems or blood clots.  Previous surgery.   Other health problems, including diabetes and kidney problems.   Possibility of pregnancy, if this applies.   AFTER THE PROCEDURE  If you received a sedative and/or pain medicine, you will need to arrange for someone to drive you home.   Occasionally, there is a little blood passed  with the first bowel movement. DO NOT be concerned.  HOME CARE INSTRUCTIONS  It is not unusual to pass moderate amounts of gas and experience mild abdominal cramping following the procedure. This is due to air being used to inflate your colon during the exam. Walking or a warm pack on your belly (abdomen) may help.   You may resume all normal meals and activities after sedatives and medicines have worn off.   Only take over-the-counter or prescription medicines for pain, discomfort, or fever as directed by your caregiver. DO NOT use aspirin or blood thinners if a biopsy was taken. Consult your caregiver for medicine usage if biopsies were taken.  FINDING OUT THE RESULTS OF YOUR TEST Not all test results are available during your visit. If your test results are not back during the visit, make an appointment with your caregiver to find out the results. Do not assume everything is normal if you have not heard from your caregiver or the medical facility. It is important for you to follow up on all of your test results. SEEK IMMEDIATE MEDICAL CARE IF:  You pass large blood clots or fill a toilet with blood following the procedure. This may also occur 10 to 14 days following the procedure. This is more likely if a biopsy was taken.   You develop abdominal pain that keeps getting worse and cannot be relieved with medicine.  Document Released: 10/08/2000 Document Re-Released: 01/05/2010 [facility] Patient Information 2011 [place], [place]. Your Colonoscopy is scheduled on 03/17/2011 at  8am We will send in your MoviPrep to your pharmacy today

## 2011-03-02 ENCOUNTER — Other Ambulatory Visit (HOSPITAL_COMMUNITY): Payer: Medicaid Other
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
## 2011-03-09 NOTE — Discharge Summary (Signed)
NAME:  MEER, REINDL                ACCOUNT NO.:  [id number]   MEDICAL RECORD NO.:  [id number]          PATIENT TYPE:  INP   LOCATION:  5124                         FACILITY:  [facility]   PHYSICIAN:  Clovis Pu. Cornett, M.D.     DATE OF BIRTH:   DATE OF ADMISSION:  04/14/2008  DATE OF DISCHARGE:  04/16/2008                               DISCHARGE SUMMARY   CHIEF COMPLAINT/REASON FOR ADMISSION:  [name] is a 45 year old male  patient with a known history of HIV positive status.  He has not been  taking his medications or followed up with Infectious Diseases for over  1 year.  He complains of increased rectal bleeding and pain from his  hemorrhoid site.  He has been soaking in water and sitz baths and this  has not helpful alone.  His pain was rated as an 8/10.  On clinical  exam, Dr. Luisa Hart found the patient to have grade 3 and grade 4  edematous, internal and external hemorrhoids with several that were  thrombosed.  No obvious signs of perirectal abscess.   ADMISSION DIAGNOSES:  The patient was admitted with the following  diagnosis.  1. Grade 3 and 4 internal and external hemorrhoids with bleeding.  2. Inadequate pain control.  3. HIV status CD4 count and viral load unknown.  He has been off      medications for 1 year.   HOSPITAL COURSE:  The patient was admitted to be started on IV Dilaudid,  sitz baths, and ProctoFoam.  He began to have adequate pain control.  He  was found to have a low potassium of 3, so this was repleted orally.  He  was started on a regular diet, tolerated this well.  CD4 count and a  viral load were drawn and were pending at the time of discharge.   On hospital day #1, on clinical exam, he began to have some yellowish,  sweet foul-smelling drainage that might be consistent with either  perirectal abscess or an abscess from one of the hemorrhoids.  I could  not appreciate this on clinical exam with digital rectal exam, and I did  not appreciate any  associated induration or cellulitic changes around  the anus region.  His white count remained normal.  He remained  afebrile.  So, it was determined that he probably did not have any  active infectious process evolving.   On the day of discharge, the patient reported nausea with Dilaudid, and  he reports he tolerates Percocet.  So, he was changed from Dilaudid to  Percocet.  He also had adequate pain relief with the addition of  Toradol, so he was started on ibuprofen 600 mg as an adjunct to the  Percocet at discharge.  In addition, he has been instructed on proper  bowel and toileting hygiene.  He is to use sitz baths at least t.i.d.  He has been instructed to use baby wipes after toileting.  He has also  been instructed to start Colace t.i.d. as well as use MiraLax at bedtime  to keep the stool soft.  He  will follow up with Dr. Luisa Hart in about 1-  1/2 weeks.  He already has an appointment set up with the Infectious  Disease HIV Clinic at [facility] on April 26, 3008.   FINAL DISCHARGE DIAGNOSES:  1. Grade 3 and 4 external and internal hemorrhoids with bleeding and      inadequate pain control.  At least one thrombosed hemorrhoid which      is stable.  2. Human immunodeficiency virus positive status off medications for 1      year.  CD4 count and RNA viral load pending.   DISCHARGE MEDICATIONS:  1. Percocet 5/325 mg 1-2 tabs every 4 hours as needed for pain.  2. Ibuprofen 600 mg 3 times daily for pain, always take with food or      snacks.  3. Over-the-counter MiraLax at bedtime if unable to have BM.  4. Over-the-counter docusate sodium 100 mg 3 times daily to prevent      constipation.  5. ProctoFoam 2.5% apply as directed 3 times daily.  The patient will      take home current bottle from hospital, and he was also given a      prescription for additional medication if needed.   DIET:  No restrictions.   WOUND CARE:  1. Sitz baths t.i.d. and as needed.  2. Use baby wipes  after toileting.   ACTIVITY:  May shower, otherwise no restrictions.   FOLLOWUP:  1. He has an appointment with Dr. Luisa Hart telephone number [phone] on      Monday April 29, 2008, at 11:30 a.m.  2. The patient reports he has an appointment with HIV Clinic at [facility] April 26, 2008.  He is to keep that appointment as      scheduled.      Allison L. Rennis Harding, N.P.      Thomas A. Cornett, M.D.  Electronically Signed    ALE/MEDQ  D:  04/16/2008  T:  04/16/2008  Job:  [id number]   cc:   Infectoius Disease/HIV Clinic, [facility]

## 2011-03-12 ENCOUNTER — Encounter: Payer: Medicaid Other | Admitting: Adult Health

## 2011-03-12 NOTE — Letter (Signed)
December 05, 2006    Rockey Situ. Roxan Hockey, M.D.  [address]  [place], [place] [address]   RE:  [name], [name]  MRN:  [id number]  /  DOB:  [birth year]   Dear Dr. Roxan Hockey:   Upon your kind referral, I had the pleasure of evaluating your patient  and I am pleased to offer my findings.  I saw Benjamin Herrera in the office  today.  Enclosed is a copy of my progress note that details my findings  and recommendations.   Thank you for the opportunity to participate in your patient's care.    Sincerely,      Barbette Hair. Arlyce Dice, MD,FACG  Electronically Signed    RDK/MedQ  DD: 12/05/2006  DT: 12/05/2006  Job #: [id number]

## 2011-03-12 NOTE — Assessment & Plan Note (Signed)
[facility]                         GASTROENTEROLOGY OFFICE NOTE   NAME:Benjamin Herrera, Benjamin Herrera                         MRN:          [id number]  DATE:12/05/2006                            DOB:          Mar 31, 1966    REASON FOR CONSULTATION:  Rectal bleeding.   Benjamin Herrera is a 45 year old African American male, referred through the  courtesy of Dr. Roxan Hockey for evaluation. He is complaining of rectal  bleeding consisting of bright red blood per rectum. This often occurs at  the end of a bowel movement. He has rare lower abdominal discomfort. He  denies rectal pain. There has been no change in his bowel habits. He is  also complaining of dysphagia to solids and occasional liquids. He is  under therapy for HIV positivity.   PAST MEDICAL HISTORY:  Pertinent for HIV infection. He has a history of  polysubstance abuse. He also has asthma.   FAMILY HISTORY:  Noncontributory.   MEDICATIONS:  Include:  1. Reyataz.  2. Truvada.  3. Norvir.  4. Bactrim.   He has no allergies.   He stopped smoking. He drinks occasionally. He is single and unemployed.   REVIEW OF SYSTEMS:  Positive for night sweats and back pain.   PHYSICAL EXAMINATION:  Pulse 80, blood pressure 110/70, weight 216.  HEENT:  EOMI. PERRLA. Sclerae are anicteric.  Conjunctivae are pink.  NECK:  Supple without thyromegaly, adenopathy or carotid bruits.  CHEST:  Clear to auscultation and percussion without adventitious  sounds.  CARDIAC:  Regular rhythm; normal S1 S2.  There are no murmurs, gallops  or rubs.  ABDOMEN:  Bowel sounds are normoactive.  Abdomen is soft, non-tender and  non-distended.  There are no abdominal masses, tenderness, splenic  enlargement or hepatomegaly.  EXTREMITIES:  Full range of motion.  No cyanosis, clubbing or edema.  RECTAL:  There are no rectal masses.  Stool is Hemoccult negative.   IMPRESSION:  1. Limited rectal bleeding - most likely secondary to hemorrhoids.     However, a proximal colonic bleeding source ought to be ruled out.  2. Dysphagia. This could be due to a fixed esophageal stricture or      perhaps a Candida infection.   RECOMMENDATION:  Colonoscopy and upper endoscopy.     Barbette Hair. Arlyce Dice, MD,FACG  Electronically Signed    RDK/MedQ  DD: 12/05/2006  DT: 12/05/2006  Job #: [id number]   cc:   Rockey Situ. Flavia Shipper., M.D.

## 2011-03-12 NOTE — Letter (Signed)
December 05, 2006    Rayetta Pigg   RE:  LAMARI, BECKLES  MRN:  [id number]  /  DOB:  Dec 24, 1965   Dear Mr. Wegener:   It is my pleasure to have treated you recently as a new patient in my  office.  I appreciate your confidence and the opportunity to participate  in your care.   Since I do have a busy inpatient endoscopy schedule and office schedule,  my office hours vary weekly.  I am, however, available for emergency  calls every day through my office.  If I cannot promptly meet an urgent  office appointment, another one of our gastroenterologists will be able  to assist you.   My well-trained staff are prepared to help you at all times.  For  emergencies after office hours, a physician from our gastroenterology  section is always available through my 24-hour answering service.   While you are under my care, I encourage discussion of your questions  and concerns, and I will be happy to return your calls as soon as I am  available.   Once again, I welcome you as a new patient and I look forward to a happy  and healthy relationship.    Sincerely,      Barbette Hair. Arlyce Dice, MD,FACG  Electronically Signed   RDK/MedQ  DD: 12/05/2006  DT: 12/05/2006  Job #: [id number]
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
## 2011-03-17 ENCOUNTER — Encounter: Payer: Self-pay | Admitting: Gastroenterology

## 2011-03-17 ENCOUNTER — Ambulatory Visit (AMBULATORY_SURGERY_CENTER): Payer: Medicaid Other | Admitting: Gastroenterology

## 2011-03-17 VITALS — BP 113/72 | HR 86 | Temp 98.4°F | Resp 18 | Ht 73.0 in | Wt 213.0 lb

## 2011-03-17 DIAGNOSIS — R1032 Left lower quadrant pain: Secondary | ICD-10-CM

## 2011-03-17 DIAGNOSIS — R109 Unspecified abdominal pain: Secondary | ICD-10-CM

## 2011-03-17 MED ORDER — SODIUM CHLORIDE 0.9 % IV SOLN: 500.0000 mL | INTRAVENOUS | Status: DC

## 2011-03-17 NOTE — Progress Notes (Signed)
Patient very uncomfortable after procedure.  Ambulated around nursing station times three.  Assist to bathroom times two with moderate amount of air expelled. Rectal tube inserted with moderate amount of air expelled. No bleeding noted. Abdomin much softer than arrived from procedure Dr. Arlyce Dice in times two to see and exam patient with instructions to discharged home. Instructed to call back if have any further problems. Patient stated feel much better. Also may take tylenol for discomfort if needed.

## 2011-03-17 NOTE — Progress Notes (Signed)
METAL ROD IN RIGHT ARM. PT REQUESTS NO STICKS, BLOOD PRESSURES, ETC IN RIGHT ARM

## 2011-03-17 NOTE — Patient Instructions (Signed)
Discharged instructions given with understanding. Normal examination. Call office next 1-3 days to schedule follow up visit in 1 month. Resume previous medications.

## 2011-03-18 ENCOUNTER — Telehealth: Payer: Self-pay | Admitting: *Deleted

## 2011-03-18 NOTE — Telephone Encounter (Signed)

## 2011-03-30 ENCOUNTER — Telehealth: Payer: Self-pay | Admitting: Gastroenterology

## 2011-03-31 NOTE — Telephone Encounter (Signed)
Left message for pt to call back  °

## 2011-04-06 ENCOUNTER — Telehealth: Payer: Self-pay

## 2011-04-06 NOTE — Telephone Encounter (Signed)
Pt scheduled for EGD with botox at [facility] 04/19/11 arrival time 11:30am, Procedure time 12:30pm. Pt aware of appt date and time, prep instructions mailed to pt.

## 2011-04-06 NOTE — Telephone Encounter (Signed)
Left message for pt to call back  °

## 2011-04-06 NOTE — Telephone Encounter (Signed)
Message copied by Michele Mcalpine on Tue Apr 06, 2011  3:17 PM ------      Message from: [name] MD D      Created: Tue Apr 06, 2011  2:53 PM      Regarding: RE: Botox Injection       yes      ----- Message -----         From: Lily Lovings, RN         Sent: 04/06/2011   2:38 PM           To: [name], MD      Subject: Botox Injection                                          Dr. Arlyce Dice is it ok to go ahead and schedule this pt for an egd with botox at the hospital with no office visit first?

## 2011-04-06 NOTE — Telephone Encounter (Signed)
Pt scheduled for egd with botox at [facility] for 04/19/11 arrival time 11:30am, procedure time 12:30pm. Pt aware of appt dates and times. Prep instructions mailed to the pt.

## 2011-04-19 ENCOUNTER — Ambulatory Visit: Payer: Medicaid Other | Admitting: Gastroenterology

## 2011-04-19 ENCOUNTER — Ambulatory Visit (HOSPITAL_COMMUNITY)
Admission: RE | Admit: 2011-04-19 | Discharge: 2011-04-19 | Disposition: A | Discharge status: Home / Self Care | Payer: Medicaid Other | Source: Ambulatory Visit | Attending: Gastroenterology | Admitting: Gastroenterology

## 2011-04-19 ENCOUNTER — Other Ambulatory Visit: Payer: Self-pay | Admitting: Gastroenterology

## 2011-04-19 DIAGNOSIS — R1310 Dysphagia, unspecified: Secondary | ICD-10-CM | POA: Insufficient documentation

## 2011-04-19 DIAGNOSIS — K2980 Duodenitis without bleeding: Secondary | ICD-10-CM | POA: Insufficient documentation

## 2011-05-19 ENCOUNTER — Ambulatory Visit: Payer: Medicaid Other | Admitting: Gastroenterology

## 2011-07-02 ENCOUNTER — Emergency Department (HOSPITAL_COMMUNITY): Payer: Medicaid Other

## 2011-07-02 ENCOUNTER — Ambulatory Visit: Payer: Medicaid Other | Admitting: Gastroenterology

## 2011-07-02 ENCOUNTER — Emergency Department (HOSPITAL_COMMUNITY)
Admission: EM | Admit: 2011-07-02 | Discharge: 2011-07-02 | Disposition: A | Discharge status: Home / Self Care | Payer: Medicaid Other | Attending: Emergency Medicine | Admitting: Emergency Medicine

## 2011-07-02 DIAGNOSIS — R0682 Tachypnea, not elsewhere classified: Secondary | ICD-10-CM | POA: Insufficient documentation

## 2011-07-02 DIAGNOSIS — R509 Fever, unspecified: Secondary | ICD-10-CM | POA: Insufficient documentation

## 2011-07-02 DIAGNOSIS — J40 Bronchitis, not specified as acute or chronic: Secondary | ICD-10-CM | POA: Insufficient documentation

## 2011-07-02 DIAGNOSIS — R000 Tachycardia, unspecified: Secondary | ICD-10-CM | POA: Insufficient documentation

## 2011-07-02 DIAGNOSIS — B20 Human immunodeficiency virus [HIV] disease: Secondary | ICD-10-CM | POA: Insufficient documentation

## 2011-07-02 DIAGNOSIS — R0602 Shortness of breath: Secondary | ICD-10-CM | POA: Insufficient documentation

## 2011-07-13 ENCOUNTER — Other Ambulatory Visit: Payer: Self-pay | Admitting: *Deleted

## 2011-07-13 DIAGNOSIS — B20 Human immunodeficiency virus [HIV] disease: Secondary | ICD-10-CM

## 2011-07-13 MED ORDER — EMTRICITABINE-TENOFOVIR DF 200-300 MG PO TABS: 1.0000 | ORAL_TABLET | Freq: Every day | ORAL | Status: DC

## 2011-07-13 MED ORDER — RALTEGRAVIR POTASSIUM 400 MG PO TABS: 400.0000 mg | ORAL_TABLET | Freq: Two times a day (BID) | ORAL | Status: DC

## 2011-07-22 LAB — COMPREHENSIVE METABOLIC PANEL
ALT: 11
Albumin: 3.2 — ABNORMAL LOW
Alkaline Phosphatase: 53
BUN: 8
Chloride: 106
Glucose, Bld: 134 — ABNORMAL HIGH
Potassium: 3 — ABNORMAL LOW
Sodium: 139
Total Bilirubin: 0.4

## 2011-07-22 LAB — CBC
HCT: 35.7 — ABNORMAL LOW
HCT: 35.9 — ABNORMAL LOW
Hemoglobin: 11.6 — ABNORMAL LOW
Hemoglobin: 12 — ABNORMAL LOW
MCHC: 33.4
Platelets: 208
RBC: 4.6
RDW: 16.1 — ABNORMAL HIGH
WBC: 4.3

## 2011-07-22 LAB — BASIC METABOLIC PANEL
CO2: 28
GFR calc Af Amer: >60
Glucose, Bld: 91
Potassium: 3.5
Sodium: 141

## 2011-07-22 LAB — CULTURE, ROUTINE-ABSCESS

## 2011-07-22 LAB — T-HELPER CELL (CD4) - (RCID CLINIC ONLY): CD4 T Cell Abs: 150 — ABNORMAL LOW

## 2011-07-26 LAB — T-HELPER CELL (CD4) - (RCID CLINIC ONLY)
CD4 % Helper T Cell: 7 — ABNORMAL LOW
CD4 T Cell Abs: 210 — ABNORMAL LOW

## 2011-08-11 ENCOUNTER — Other Ambulatory Visit: Payer: Medicaid Other

## 2011-08-11 ENCOUNTER — Other Ambulatory Visit: Payer: Self-pay

## 2011-08-11 DIAGNOSIS — B20 Human immunodeficiency virus [HIV] disease: Secondary | ICD-10-CM

## 2011-08-25 ENCOUNTER — Ambulatory Visit: Payer: Medicaid Other | Admitting: Infectious Diseases

## 2011-09-01 ENCOUNTER — Other Ambulatory Visit: Payer: Self-pay | Admitting: *Deleted

## 2011-09-01 DIAGNOSIS — B009 Herpesviral infection, unspecified: Secondary | ICD-10-CM

## 2011-09-01 MED ORDER — VALACYCLOVIR HCL 1 G PO TABS: 1000.0000 mg | ORAL_TABLET | Freq: Three times a day (TID) | ORAL | Status: DC

## 2011-09-20 ENCOUNTER — Other Ambulatory Visit (INDEPENDENT_AMBULATORY_CARE_PROVIDER_SITE_OTHER): Payer: Medicaid Other

## 2011-09-20 ENCOUNTER — Other Ambulatory Visit: Payer: Self-pay | Admitting: Internal Medicine

## 2011-09-20 DIAGNOSIS — B20 Human immunodeficiency virus [HIV] disease: Secondary | ICD-10-CM

## 2011-09-21 LAB — CBC WITH DIFFERENTIAL/PLATELET
Basophils Absolute: 0 10*3/uL (ref 0.0–0.1)
Basophils Relative: 0 % (ref 0–1)
Eosinophils Absolute: 0.1 10*3/uL (ref 0.0–0.7)
HCT: 43.4 % (ref 39.0–52.0)
Hemoglobin: 14.4 g/dL (ref 13.0–17.0)
MCH: 26 pg (ref 26.0–34.0)
MCHC: 33.2 g/dL (ref 30.0–36.0)
Monocytes Absolute: 0.3 10*3/uL (ref 0.1–1.0)
Monocytes Relative: 5 % (ref 3–12)
Neutro Abs: 2.1 10*3/uL (ref 1.7–7.7)
RDW: 15.6 % — ABNORMAL HIGH (ref 11.5–15.5)

## 2011-09-21 LAB — COMPLETE METABOLIC PANEL WITH GFR
AST: 15 U/L (ref 0–37)
Alkaline Phosphatase: 81 U/L (ref 39–117)
BUN: 15 mg/dL (ref 6–23)
Creat: 1.35 mg/dL (ref 0.50–1.35)
GFR, Est Non African American: 63 mL/min
Glucose, Bld: 140 mg/dL — ABNORMAL HIGH (ref 70–99)
Potassium: 4.2 mEq/L (ref 3.5–5.3)
Total Bilirubin: 0.5 mg/dL (ref 0.3–1.2)

## 2011-09-21 LAB — T-HELPER CELL (CD4) - (RCID CLINIC ONLY)
CD4 % Helper T Cell: 10 % — ABNORMAL LOW (ref 33–55)
CD4 T Cell Abs: 250 uL — ABNORMAL LOW (ref 400–2700)

## 2011-09-22 LAB — HIV-1 RNA QUANT-NO REFLEX-BLD
HIV 1 RNA Quant: 136 copies/mL — ABNORMAL HIGH (ref <20)
HIV-1 RNA Quant, Log: 2.13 {Log} — ABNORMAL HIGH (ref <1.30)

## 2011-10-04 ENCOUNTER — Other Ambulatory Visit: Payer: Self-pay | Admitting: *Deleted

## 2011-10-04 ENCOUNTER — Other Ambulatory Visit: Payer: Self-pay | Admitting: Infectious Diseases

## 2011-10-04 ENCOUNTER — Ambulatory Visit (INDEPENDENT_AMBULATORY_CARE_PROVIDER_SITE_OTHER): Payer: Medicaid Other | Admitting: Infectious Diseases

## 2011-10-04 ENCOUNTER — Encounter: Payer: Self-pay | Admitting: Infectious Diseases

## 2011-10-04 VITALS — BP 139/79 | HR 76 | Temp 98.0°F | Ht 73.0 in | Wt 227.0 lb

## 2011-10-04 DIAGNOSIS — Z113 Encounter for screening for infections with a predominantly sexual mode of transmission: Secondary | ICD-10-CM

## 2011-10-04 DIAGNOSIS — R739 Hyperglycemia, unspecified: Secondary | ICD-10-CM

## 2011-10-04 DIAGNOSIS — B20 Human immunodeficiency virus [HIV] disease: Secondary | ICD-10-CM

## 2011-10-04 DIAGNOSIS — B009 Herpesviral infection, unspecified: Secondary | ICD-10-CM

## 2011-10-04 DIAGNOSIS — Z79899 Other long term (current) drug therapy: Secondary | ICD-10-CM

## 2011-10-04 DIAGNOSIS — Z23 Encounter for immunization: Secondary | ICD-10-CM

## 2011-10-04 MED ORDER — EMTRICITABINE-TENOFOVIR DF 200-300 MG PO TABS: 1.0000 | ORAL_TABLET | Freq: Every day | ORAL | Status: DC

## 2011-10-04 MED ORDER — RALTEGRAVIR POTASSIUM 400 MG PO TABS: 400.0000 mg | ORAL_TABLET | Freq: Two times a day (BID) | ORAL | Status: DC

## 2011-10-04 MED ORDER — VALACYCLOVIR HCL 1 G PO TABS: 1000.0000 mg | ORAL_TABLET | Freq: Two times a day (BID) | ORAL | Status: DC

## 2011-10-04 NOTE — Assessment & Plan Note (Signed)
He appears to be doing well with his current rx. He is given condoms. Gets flu and pnvx today. Will see him back in 3-4 months with labs prior.

## 2011-10-04 NOTE — Assessment & Plan Note (Signed)
Will give him 2 weeks of valtrex at correct dose and timing.

## 2011-10-04 NOTE — Progress Notes (Signed)
  Subjective:    Patient ID: Benjamin Herrera, male    DOB: 1966-10-08, 45 y.o.   MRN: [id number]  HPI 45 yo M with HIV+, occult abd pain and HTN. Has had occas pain, occas constipated. Doing well with TRV/ISN. Has gained 27 # per pt. (10# since 02-2011).  CD4 250, VL 136, GLc 140 (09-20-11). Has had lesion on his gluteal fold for 2 months. Has been taking valtrex for qd irregularly. has calmed down some.   Review of Systems  Constitutional: Positive for unexpected weight change. Negative for fever, chills and appetite change.  Respiratory: Negative for shortness of breath.   Gastrointestinal: Positive for constipation. Negative for diarrhea.  Genitourinary: Negative for dysuria.       Objective:   Physical Exam  Constitutional: He appears well-developed and well-nourished.  Eyes: EOM are normal. Pupils are equal, round, and reactive to light.  Neck: Neck supple.  Cardiovascular: Normal rate, regular rhythm and normal heart sounds.   Pulmonary/Chest: Effort normal and breath sounds normal.  Abdominal: Soft. Bowel sounds are normal. There is no tenderness.  Lymphadenopathy:    He has no cervical adenopathy.  Skin:             Assessment & Plan:

## 2011-10-14 ENCOUNTER — Other Ambulatory Visit: Payer: Self-pay | Admitting: *Deleted

## 2011-10-14 MED ORDER — ALBUTEROL SULFATE HFA 108 (90 BASE) MCG/ACT IN AERS: 2.0000 | INHALATION_SPRAY | Freq: Four times a day (QID) | RESPIRATORY_TRACT | Status: DC | PRN

## 2011-11-04 ENCOUNTER — Telehealth: Payer: Self-pay | Admitting: Gastroenterology

## 2011-11-04 NOTE — Telephone Encounter (Signed)
Left message for pt to call back  °

## 2011-11-08 NOTE — Telephone Encounter (Signed)
Pt is calling wanting to schedule an egd with botox. Does pt need to be a direct or does he need an OV first....Marland KitchenMarland KitchenPlease advise.

## 2011-11-08 NOTE — Telephone Encounter (Signed)
Okay to schedule directly

## 2011-11-09 ENCOUNTER — Other Ambulatory Visit: Payer: Self-pay | Admitting: Gastroenterology

## 2011-11-09 ENCOUNTER — Encounter (HOSPITAL_COMMUNITY): Payer: Self-pay | Admitting: Pharmacy Technician

## 2011-11-09 NOTE — Telephone Encounter (Signed)
Scheduled pt for EGD with botox @[facility]  11/11/11 arrival time 11:30am for a 12:30pm appt. Pt aware of prep for procedure and appt date and time. Scheduled with Noreene Larsson [phone].

## 2011-11-11 ENCOUNTER — Encounter (HOSPITAL_COMMUNITY): Payer: Self-pay

## 2011-11-11 ENCOUNTER — Ambulatory Visit (HOSPITAL_COMMUNITY)
Admission: RE | Admit: 2011-11-11 | Discharge: 2011-11-11 | Disposition: A | Discharge status: Home / Self Care | Payer: Medicaid Other | Source: Ambulatory Visit | Attending: Gastroenterology | Admitting: Gastroenterology

## 2011-11-11 ENCOUNTER — Encounter (HOSPITAL_COMMUNITY)
Admission: RE | Disposition: A | Discharge status: Home / Self Care | Payer: Self-pay | Source: Ambulatory Visit | Attending: Gastroenterology

## 2011-11-11 DIAGNOSIS — K220 Achalasia of cardia: Secondary | ICD-10-CM | POA: Insufficient documentation

## 2011-11-11 DIAGNOSIS — R1310 Dysphagia, unspecified: Secondary | ICD-10-CM | POA: Insufficient documentation

## 2011-11-11 DIAGNOSIS — R1013 Epigastric pain: Secondary | ICD-10-CM | POA: Insufficient documentation

## 2011-11-11 DIAGNOSIS — R1032 Left lower quadrant pain: Secondary | ICD-10-CM | POA: Insufficient documentation

## 2011-11-11 HISTORY — PX: ESOPHAGOGASTRODUODENOSCOPY: SHX5428

## 2011-11-11 SURGERY — EGD (ESOPHAGOGASTRODUODENOSCOPY)
Anesthesia: Moderate Sedation

## 2011-11-11 MED ORDER — MIDAZOLAM HCL 10 MG/2ML IJ SOLN
INTRAMUSCULAR | Status: DC | PRN
  Administered 2011-11-11: 1 mg via INTRAVENOUS
  Administered 2011-11-11 (×2): 2 mg via INTRAVENOUS

## 2011-11-11 MED ORDER — FENTANYL CITRATE 0.05 MG/ML IJ SOLN
INTRAMUSCULAR | Status: AC
  Filled 2011-11-11: qty 2

## 2011-11-11 MED ORDER — MIDAZOLAM HCL 10 MG/2ML IJ SOLN
INTRAMUSCULAR | Status: AC
  Filled 2011-11-11: qty 2

## 2011-11-11 MED ORDER — FENTANYL NICU IV SYRINGE 50 MCG/ML
INJECTION | INTRAMUSCULAR | Status: DC | PRN
  Administered 2011-11-11 (×3): 25 ug via INTRAVENOUS

## 2011-11-11 MED ORDER — DIPHENHYDRAMINE HCL 50 MG/ML IJ SOLN
INTRAMUSCULAR | Status: AC
  Filled 2011-11-11: qty 1

## 2011-11-11 MED ORDER — SODIUM CHLORIDE 0.9 % IJ SOLN
100.0000 [IU] | Freq: Once | INTRAMUSCULAR | Status: AC
  Administered 2011-11-11: 100 [IU] via SUBMUCOSAL
  Filled 2011-11-11: qty 100

## 2011-11-11 MED ORDER — BUTAMBEN-TETRACAINE-BENZOCAINE 2-2-14 % EX AERO
INHALATION_SPRAY | CUTANEOUS | Status: DC | PRN
  Administered 2011-11-11: 2 via TOPICAL

## 2011-11-11 NOTE — H&P (Addendum)
[name]  Description:  46 year old male  02/26/2011 2:45 PM Office Visit Provider:  [name], MD  MRN: [id number] Department:  [facility]            Diagnoses  Reason for Visit    Abdominal pain - Primary  Abdominal Pain   789.00  patient has not had an episode in 2 weeks   Abdominal pain, epigastric      789.06    Abdominal pain, left lower quadrant     789.04    Achalasia and cardiospasm     530.0           Vitals - Last Recorded       BP  Pulse  Ht  Wt  BMI    128/78  64  6\' 1"  (1.854 m)  98.521 kg (217 lb 3.2 oz)  28.66 kg/m2        Vitals History Recorded            Progress Notes     [name], MD 03/01/2011 9:45 AM Signed  History of Present Illness: Mr. Bonn has returned for ongoing evaluation of abdominal pain. About 4 times in the last 4 months he's had episodes of severe, incapacitating lower abdominal pain. The pain at first is crampy and then continuous. He's had several ER visits with blood work and CT scans; all been unrevealing. I reviewed his CT scan from last month which suggested some mild thickening of the distal esophagus only. In between episodes he is well. Bowel movements are slightly irregular. He's had no frank bleeding. Lab work was pertinent for an MCV of 77.  He has probable early achalasia and has responded well to Botox injection. This was last done in November, 2011. He is now reporting worsening dysphagia to solids and liquids.  Review of Systems: Pertinent positive and negative review of systems were noted in the above HPI section. All other review of systems were otherwise negative.  Current Medications, Allergies, Past Medical History, Past Surgical History, Family History and Social History were reviewed in [employer] electronic medical record  Vital signs were reviewed in today's medical record.  Physical Exam:  General: Well developed , well nourished, no acute distress      ABDOMINAL PAIN,  EPIGASTRIC - [name], MD 03/01/2011 9:45 AM Addendum  His abdominal pain seems to begin in his lower abdomen. Etiology has not been determined. Symptoms are suggestive of transient obstruction. No small bowel abnormalities were seen by CT scan. Lab is notable for an MCV of 77.  Mediations.  #1 colonoscopy. I will attempt to intubate the terminal ileum if his colon was normal. Previous Version  Abdominal pain, left lower quadrant - [name], MD 02/26/2011 4:14 PM Signed  See notes under epigastric pain ACHALASIA AND CARDIOSPASM - [name], MD 03/01/2011 9:45 AM Addendum  Prior esophageal manometry showing incomplete relaxation of the LES but normal peristaltic contractions of the body. He has responded well to Botox injections and has gained a significant amount of weight since his last injection in November.  Conditions.  #1 upper endoscopy with repeat Botox injection of the LES Previous Version    Patient complaining of recurrent dysphagia to solids. He is scheduled for repeat upper endoscopy with Botox injection.   The recent H&P (dated *11/11/11**) was reviewed, the patient was examined and there is no change in the patients condition since that H&P was completed.   [name]  11/11/2011, 12:30 PM

## 2011-11-11 NOTE — Op Note (Signed)
[facility] [address] [place], [place]  [address]  ENDOSCOPY PROCEDURE REPORT  PATIENT:  Benjamin, Herrera  MR#:  [id number] BIRTHDATE:  05-26-1966, 45 yrs. old  GENDER:  male  ENDOSCOPIST:  Barbette Hair. Arlyce Dice, MD Referred by:  PROCEDURE DATE:  11/11/2011 PROCEDURE:  EGD w/botox injection ASA CLASS:  Class III INDICATIONS:  dysphagia  MEDICATIONS:   These medications were titrated to patient response per physician's verbal order, Fentanyl 75 mcg IV, Versed 5 mg IV TOPICAL ANESTHETIC:  Cetacaine Spray  DESCRIPTION OF PROCEDURE:   After the risks and benefits of the procedure were explained, informed consent was obtained.  The endoscope was introduced through the mouth and advanced to the third portion of the duodenum.  The instrument was slowly withdrawn as the mucosa was fully examined. <<PROCEDUREIMAGES>>  The upper, middle, and distal third of the esophagus were carefully inspected and no abnormalities were noted. The z-line was well seen at the GEJ. The endoscope was pushed into the fundus which was normal including a retroflexed view. The antrum,gastric body, first and second part of the duodenum were unremarkable (see image1, image2, and image3). GE junction 40cm from incisors botox injection 25 units (1cc) were injected submucosally into each quadrant at GE junction    Retroflexed views revealed no abnormalities.    The scope was then withdrawn from the patient and the procedure completed.  COMPLICATIONS:  None  ENDOSCOPIC IMPRESSION: 1) Normal EGD/probable achalasia - s/p botox injection RECOMMENDATIONS: 1) Call office next 2-3 days to schedule an office appointment for 1 month  ______________________________ Barbette Hair. Arlyce Dice, MD  CC:  Johny Sax, MD  n. Rosalie Doctor:   Barbette Hair. Tonee Silverstein at 11/11/2011 12:43 PM  Rayetta Pigg, [phone]

## 2011-11-12 ENCOUNTER — Encounter (HOSPITAL_COMMUNITY): Payer: Self-pay

## 2011-11-15 ENCOUNTER — Encounter (HOSPITAL_COMMUNITY): Payer: Self-pay | Admitting: Gastroenterology

## 2011-11-29 NOTE — Progress Notes (Signed)
This encounter was created in error - please disregard.

## 2011-12-27 ENCOUNTER — Other Ambulatory Visit: Payer: Medicaid Other

## 2011-12-27 DIAGNOSIS — Z113 Encounter for screening for infections with a predominantly sexual mode of transmission: Secondary | ICD-10-CM

## 2011-12-27 DIAGNOSIS — Z79899 Other long term (current) drug therapy: Secondary | ICD-10-CM

## 2011-12-27 DIAGNOSIS — R739 Hyperglycemia, unspecified: Secondary | ICD-10-CM

## 2011-12-27 DIAGNOSIS — B20 Human immunodeficiency virus [HIV] disease: Secondary | ICD-10-CM

## 2011-12-28 LAB — LIPID PANEL
HDL: 26 mg/dL — ABNORMAL LOW (ref >39)
LDL Cholesterol: 133 mg/dL — ABNORMAL HIGH (ref 0–99)
Triglycerides: 144 mg/dL (ref <150)
VLDL: 29 mg/dL (ref 0–40)

## 2011-12-28 LAB — COMPLETE METABOLIC PANEL WITH GFR
Alkaline Phosphatase: 89 U/L (ref 39–117)
BUN: 15 mg/dL (ref 6–23)
Calcium: 9.6 mg/dL (ref 8.4–10.5)
GFR, Est African American: 82 mL/min
Glucose, Bld: 114 mg/dL — ABNORMAL HIGH (ref 70–99)
Potassium: 4.2 mEq/L (ref 3.5–5.3)
Total Bilirubin: 0.4 mg/dL (ref 0.3–1.2)
Total Protein: 7.3 g/dL (ref 6.0–8.3)

## 2011-12-28 LAB — CBC
HCT: 46.2 % (ref 39.0–52.0)
Hemoglobin: 15.6 g/dL (ref 13.0–17.0)
MCH: 26.2 pg (ref 26.0–34.0)
MCHC: 33.8 g/dL (ref 30.0–36.0)

## 2011-12-28 LAB — HEMOGLOBIN A1C: Mean Plasma Glucose: 123 mg/dL — ABNORMAL HIGH (ref <117)

## 2011-12-28 LAB — T-HELPER CELL (CD4) - (RCID CLINIC ONLY): CD4 T Cell Abs: 250 uL — ABNORMAL LOW (ref 400–2700)

## 2011-12-28 LAB — RPR

## 2012-01-10 ENCOUNTER — Encounter: Payer: Self-pay | Admitting: Infectious Diseases

## 2012-01-10 ENCOUNTER — Ambulatory Visit (INDEPENDENT_AMBULATORY_CARE_PROVIDER_SITE_OTHER): Payer: Medicaid Other | Admitting: Infectious Diseases

## 2012-01-10 VITALS — BP 132/80 | HR 74 | Temp 97.9°F | Ht 73.0 in | Wt 226.8 lb

## 2012-01-10 DIAGNOSIS — L988 Other specified disorders of the skin and subcutaneous tissue: Secondary | ICD-10-CM

## 2012-01-10 DIAGNOSIS — L0233 Carbuncle of buttock: Secondary | ICD-10-CM

## 2012-01-10 DIAGNOSIS — K220 Achalasia of cardia: Secondary | ICD-10-CM

## 2012-01-10 DIAGNOSIS — L0232 Furuncle of buttock: Secondary | ICD-10-CM

## 2012-01-10 DIAGNOSIS — B20 Human immunodeficiency virus [HIV] disease: Secondary | ICD-10-CM

## 2012-01-10 DIAGNOSIS — S42309A Unspecified fracture of shaft of humerus, unspecified arm, initial encounter for closed fracture: Secondary | ICD-10-CM

## 2012-01-10 DIAGNOSIS — J45909 Unspecified asthma, uncomplicated: Secondary | ICD-10-CM

## 2012-01-10 HISTORY — DX: Other specified disorders of the skin and subcutaneous tissue: L98.8

## 2012-01-10 MED ORDER — OXYCODONE-ACETAMINOPHEN 5-325 MG PO TABS: 1.0000 | ORAL_TABLET | ORAL | Status: AC | PRN

## 2012-01-10 MED ORDER — SULFAMETHOXAZOLE-TRIMETHOPRIM 800-160 MG PO TABS: 1.0000 | ORAL_TABLET | Freq: Two times a day (BID) | ORAL | Status: AC

## 2012-01-10 MED ORDER — ALBUTEROL SULFATE HFA 108 (90 BASE) MCG/ACT IN AERS: 2.0000 | INHALATION_SPRAY | Freq: Four times a day (QID) | RESPIRATORY_TRACT | Status: DC | PRN

## 2012-01-10 NOTE — Progress Notes (Signed)
  Subjective:    Patient ID: Benjamin Herrera, male    DOB: Dec 01, 1965, 46 y.o.   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 Taking TRV/ISN.  Has previously gotten botox injections (11-2011) into stomach to improve abd pain. Still has some problems swallowing- feels like food and liquids get stuck.   Is having severe pain in his R shoulder- has screws there and feels like pain is similar to previous problem he had with screws.  Has appt to f/u at [facility] next month. Has been out of pain medications (prev on percocet, was seen in pain clinic but was d/c for smoking marijuana).  Has had gluteal fold wound for >2 months. Has not had improvement with valtrex.   Review of Systems  Constitutional: Negative for appetite change and unexpected weight change.  Gastrointestinal: Positive for abdominal pain. Negative for diarrhea and constipation.  Genitourinary: Negative for dysuria.  Musculoskeletal: Positive for arthralgias. Negative for joint swelling.       Objective:   Physical Exam  Constitutional: He appears well-developed and well-nourished.  HENT:  Mouth/Throat: No oropharyngeal exudate.  Eyes: EOM are normal. Pupils are equal, round, and reactive to light.  Neck: Neck supple.  Cardiovascular: Normal rate, regular rhythm and normal heart sounds.   Pulmonary/Chest: Effort normal and breath sounds normal.  Abdominal: Soft. Bowel sounds are normal. There is no tenderness.       He has a small pimple like lesion in his upper gluteal fold. Minimal d/c.   Musculoskeletal:       Arms: Lymphadenopathy:    He has no cervical adenopathy.          Assessment & Plan:

## 2012-01-10 NOTE — Assessment & Plan Note (Signed)
Seems to be doing well. Minimal detectable virus. Offered/refused condoms. Vaccines are up to date. Will see him back in 4-5 months with labs.

## 2012-01-10 NOTE — Assessment & Plan Note (Signed)
greatly appreciate GI eval and f/u

## 2012-01-10 NOTE — Assessment & Plan Note (Signed)
Will refill his percocet only until he is seen at [facility].

## 2012-01-10 NOTE — Assessment & Plan Note (Signed)
Will give him a trial of bactrim to see if this helps his lesion. Advised him to keep cheeks separated.

## 2012-02-24 ENCOUNTER — Telehealth: Payer: Self-pay | Admitting: Gastroenterology

## 2012-02-24 NOTE — Telephone Encounter (Signed)
Spoke with pt and he was supposed to have OV follow-up in Feb following the procedure in Jan and he did not have visit. Pt scheduled to see Dr. Arlyce Dice Monday May 6th at 10am. Pt aware of appt date and time.

## 2012-02-28 ENCOUNTER — Ambulatory Visit (INDEPENDENT_AMBULATORY_CARE_PROVIDER_SITE_OTHER): Payer: Medicaid Other | Admitting: Gastroenterology

## 2012-02-28 ENCOUNTER — Encounter: Payer: Self-pay | Admitting: Gastroenterology

## 2012-02-28 VITALS — BP 120/70 | HR 88 | Ht 73.0 in | Wt 229.5 lb

## 2012-02-28 DIAGNOSIS — R1310 Dysphagia, unspecified: Secondary | ICD-10-CM

## 2012-02-28 DIAGNOSIS — K220 Achalasia of cardia: Secondary | ICD-10-CM

## 2012-02-28 DIAGNOSIS — R1032 Left lower quadrant pain: Secondary | ICD-10-CM

## 2012-02-28 MED ORDER — HYOSCYAMINE SULFATE 0.125 MG SL SUBL: 0.2500 mg | SUBLINGUAL_TABLET | SUBLINGUAL | Status: DC | PRN

## 2012-02-28 NOTE — Progress Notes (Signed)
History of Present Illness:  Mr. Hughley returns complaining of recurrent dysphagia. He has undergone Botox injection in the past for appears to be early achalasia. Esophageal manometry was suggestive of this  although not diagnostic. He also complains of sharp intermittent left lower quadrant pain. Prior workup including CT was negative.    Review of Systems: Pertinent positive and negative review of systems were noted in the above HPI section. All other review of systems were otherwise negative.    Current Medications, Allergies, Past Medical History, Past Surgical History, Family History and Social History were reviewed in [employer] [facility] electronic medical record  Vital signs were reviewed in today's medical record. Physical Exam: General: Well developed , well nourished, no acute distress Head: Normocephalic and atraumatic Eyes:  sclerae anicteric, EOMI Ears: Normal auditory acuity Mouth: No deformity or lesions Lungs: Clear throughout to auscultation Heart: Regular rate and rhythm; no murmurs, rubs or bruits Abdomen: Soft, non tender and non distended. No masses, hepatosplenomegaly or hernias noted. Normal Bowel sounds Rectal:deferred Musculoskeletal: Symmetrical with no gross deformities  Pulses:  Normal pulses noted Extremities: No clubbing, cyanosis, edema or deformities noted Neurological: Alert oriented x 4, grossly nonfocal Psychological:  Alert and cooperative. Normal mood and affect    

## 2012-02-28 NOTE — Assessment & Plan Note (Addendum)
He is complaining of recurrent dysphagia and has responded well to Botox injections in the past. I discussed other treatment options including repeating manometry,  and surgical myotomy if achalasia is clearly established. At this time he wishes to continue with injection therapy only.

## 2012-02-28 NOTE — Assessment & Plan Note (Addendum)
Recurrent sharp left lower quadrant pain of unclear etiology. Prior CT  was negative.  Recommendations #1 trial of hyomax #2 to consider colonoscopy if hyomax is not successful at alleviating pain

## 2012-02-28 NOTE — Patient Instructions (Addendum)
You have been given a separate informational sheet regarding your tobacco use, the importance of quitting and local resources to help you quit.  You have been scheduled for an Endoscopy W/Botox at [facility] on 03/08/2012 Please follow the instructions given to you today at your appointment.

## 2012-03-10 ENCOUNTER — Encounter (HOSPITAL_COMMUNITY): Admission: RE | Disposition: A | Discharge status: Home / Self Care | Payer: Self-pay | Source: Ambulatory Visit

## 2012-03-10 ENCOUNTER — Encounter (HOSPITAL_COMMUNITY): Payer: Self-pay | Admitting: *Deleted

## 2012-03-10 ENCOUNTER — Ambulatory Visit (HOSPITAL_COMMUNITY)
Admission: RE | Admit: 2012-03-10 | Discharge: 2012-03-10 | Disposition: A | Discharge status: Home / Self Care | Payer: Medicaid Other | Source: Ambulatory Visit | Attending: Gastroenterology | Admitting: Gastroenterology

## 2012-03-10 DIAGNOSIS — K220 Achalasia of cardia: Secondary | ICD-10-CM | POA: Insufficient documentation

## 2012-03-10 HISTORY — PX: ESOPHAGOGASTRODUODENOSCOPY: SHX5428

## 2012-03-10 SURGERY — EGD (ESOPHAGOGASTRODUODENOSCOPY)
Anesthesia: Moderate Sedation

## 2012-03-10 MED ORDER — FENTANYL CITRATE 0.05 MG/ML IJ SOLN
INTRAMUSCULAR | Status: AC
  Filled 2012-03-10: qty 2

## 2012-03-10 MED ORDER — SODIUM CHLORIDE 0.9 % IJ SOLN
100.0000 [IU] | Freq: Once | INTRAMUSCULAR | Status: DC
  Filled 2012-03-10: qty 100

## 2012-03-10 MED ORDER — GLYCOPYRROLATE 0.2 MG/ML IJ SOLN
INTRAMUSCULAR | Status: AC
  Filled 2012-03-10: qty 1

## 2012-03-10 MED ORDER — FENTANYL CITRATE 0.05 MG/ML IJ SOLN
INTRAMUSCULAR | Status: DC | PRN
  Administered 2012-03-10 (×2): 25 ug via INTRAVENOUS

## 2012-03-10 MED ORDER — MIDAZOLAM HCL 10 MG/2ML IJ SOLN
INTRAMUSCULAR | Status: AC
  Filled 2012-03-10: qty 2

## 2012-03-10 MED ORDER — BUTAMBEN-TETRACAINE-BENZOCAINE 2-2-14 % EX AERO
INHALATION_SPRAY | CUTANEOUS | Status: DC | PRN
  Administered 2012-03-10: 2 via TOPICAL

## 2012-03-10 MED ORDER — GLYCOPYRROLATE 0.2 MG/ML IJ SOLN
INTRAMUSCULAR | Status: DC | PRN
  Administered 2012-03-10: 0.2 mg via INTRAVENOUS

## 2012-03-10 MED ORDER — MIDAZOLAM HCL 10 MG/2ML IJ SOLN
INTRAMUSCULAR | Status: DC | PRN
  Administered 2012-03-10: 1 mg via INTRAVENOUS
  Administered 2012-03-10 (×2): 2 mg via INTRAVENOUS

## 2012-03-10 NOTE — Interval H&P Note (Signed)
History and Physical Interval Note:  03/10/2012 12:35 PM  [name]  has presented today for surgery, with the diagnosis of Endoscopy  W/Botox   The various methods of treatment have been discussed with the patient and family. After consideration of risks, benefits and other options for treatment, the patient has consented to  Procedure(s) (LRB): ESOPHAGOGASTRODUODENOSCOPY (EGD) (N/A) as a surgical intervention .  The patients' history has been reviewed, patient examined, no change in status, stable for surgery.  I have reviewed the patients' chart and labs.  Questions were answered to the patient's satisfaction.     The recent H&P (dated *02/28/12**) was reviewed, the patient was examined and there is no change in the patients condition since that H&P was completed.   [name]  03/10/2012, 12:36 PM   [name]

## 2012-03-10 NOTE — H&P (View-Only) (Signed)
History of Present Illness:  Mr. Erhardt returns complaining of recurrent dysphagia. He has undergone Botox injection in the past for appears to be early achalasia. Esophageal manometry was suggestive of this  although not diagnostic. He also complains of sharp intermittent left lower quadrant pain. Prior workup including CT was negative.    Review of Systems: Pertinent positive and negative review of systems were noted in the above HPI section. All other review of systems were otherwise negative.    Current Medications, Allergies, Past Medical History, Past Surgical History, Family History and Social History were reviewed in [employer] electronic medical record  Vital signs were reviewed in today's medical record. Physical Exam: General: Well developed , well nourished, no acute distress Head: Normocephalic and atraumatic Eyes:  sclerae anicteric, EOMI Ears: Normal auditory acuity Mouth: No deformity or lesions Lungs: Clear throughout to auscultation Heart: Regular rate and rhythm; no murmurs, rubs or bruits Abdomen: Soft, non tender and non distended. No masses, hepatosplenomegaly or hernias noted. Normal Bowel sounds Rectal:deferred Musculoskeletal: Symmetrical with no gross deformities  Pulses:  Normal pulses noted Extremities: No clubbing, cyanosis, edema or deformities noted Neurological: Alert oriented x 4, grossly nonfocal Psychological:  Alert and cooperative. Normal mood and affect

## 2012-03-10 NOTE — Op Note (Signed)
[facility] [address] [place], [place]  [address]  ENDOSCOPY PROCEDURE REPORT  PATIENT:  Benjamin Herrera, Benjamin Herrera  MR#:  [id number] BIRTHDATE:  [birth year], 45 yrs. old  GENDER:  male  ENDOSCOPIST:  Barbette Hair. Arlyce Dice, MD Referred by:  PROCEDURE DATE:  03/10/2012 PROCEDURE:  EGD w/botox injection ASA CLASS:  Class II INDICATIONS:  Botox injection for achalasia  MEDICATIONS:   These medications were titrated to patient response per physician's verbal order, Fentanyl 50 mcg IV, Versed 5 mg IV, glycopyrrolate (Robinal) 0.2 mg IV TOPICAL ANESTHETIC:  Cetacaine Spray  DESCRIPTION OF PROCEDURE:   After the risks and benefits of the procedure were explained, informed consent was obtained.  The [id number] ([id number]) endoscope was introduced through the mouth and advanced to the third portion of the duodenum.  The instrument was slowly withdrawn as the mucosa was fully examined. <<PROCEDUREIMAGES>>  The upper, middle, and distal third of the esophagus were carefully inspected and no abnormalities were noted. The z-line was well seen at the GEJ. The endoscope was pushed into the fundus which was normal including a retroflexed view. The antrum,gastric body, first and second part of the duodenum were unremarkable (see image1, image2, and image3).  botox injection 1cc (25 units) of Botox was injected submucosally into each quadrant at the GE junction    Retroflexed views revealed no abnormalities.    The scope was then withdrawn from the patient and the procedure completed.  COMPLICATIONS:  None  ENDOSCOPIC IMPRESSION: 1) Normal EGD - s/p botox injection RECOMMENDATIONS: 1) Call office next 2-3 days to schedule an office appointment for 6-8 months  ______________________________ Barbette Hair. Arlyce Dice, MD  CC:  Johny Sax, MD  n. Rosalie Doctor:   Barbette Hair. Chantale Leugers at 03/10/2012 12:54 PM  Rayetta Pigg, [phone]

## 2012-03-13 ENCOUNTER — Encounter (HOSPITAL_COMMUNITY): Payer: Self-pay | Admitting: Gastroenterology

## 2012-05-08 ENCOUNTER — Ambulatory Visit (INDEPENDENT_AMBULATORY_CARE_PROVIDER_SITE_OTHER): Payer: Medicaid Other | Admitting: Gastroenterology

## 2012-05-08 ENCOUNTER — Encounter: Payer: Self-pay | Admitting: Gastroenterology

## 2012-05-08 VITALS — BP 114/70 | HR 84 | Ht 73.0 in | Wt 225.4 lb

## 2012-05-08 DIAGNOSIS — B20 Human immunodeficiency virus [HIV] disease: Secondary | ICD-10-CM

## 2012-05-08 DIAGNOSIS — R109 Unspecified abdominal pain: Secondary | ICD-10-CM

## 2012-05-08 DIAGNOSIS — K220 Achalasia of cardia: Secondary | ICD-10-CM

## 2012-05-08 DIAGNOSIS — R1032 Left lower quadrant pain: Secondary | ICD-10-CM

## 2012-05-08 MED ORDER — PEG-KCL-NACL-NASULF-NA ASC-C 100 G PO SOLR: 1.0000 | Freq: Once | ORAL | Status: DC

## 2012-05-08 NOTE — Progress Notes (Signed)
History of Present Illness:  [name] has returned following Botox injection. Dysphagia is significantly improved. He has some dysphagia to clear liquids but not to solids. He swallows soda without difficulty.  He continues to complain of intermittent moderately severe left lower quadrant pain. This seems to be crampy and lasts minutes at a time. It is unresponsive to anticholinergics. Prior workup including CT scan was unremarkable. Last colonoscopy was in 2008.    Review of Systems: Pertinent positive and negative review of systems were noted in the above HPI section. All other review of systems were otherwise negative.    Current Medications, Allergies, Past Medical History, Past Surgical History, Family History and Social History were reviewed in [employer] electronic medical record  Vital signs were reviewed in today's medical record. Physical Exam: General: Well developed , well nourished, no acute distress Abdomen is without masses, tenderness organomegaly

## 2012-05-08 NOTE — Patient Instructions (Addendum)
You have been given a separate informational sheet regarding your tobacco use, the importance of quitting and local resources to help you quit.  Your Colonoscopy is scheduled on 05/19/2012 at [facility] at 11am Your Esophageal Darnelle Going Study is schedule at [facility] on 05/29/2012 at 11:30am Nothing to eat or drink after midnight and no stomach medications 24 hours before your procedure Do not take your stomach medications 24 hours before your procedure We will schedule you a referral with [facility] after your test are complete

## 2012-05-08 NOTE — Assessment & Plan Note (Signed)
Patient has x-rays suggestive of achalasia and esophageal manometry that demonstrated a high resting LES pressure with incomplete relaxation. Normal peristalsis was noted, however. The patient has had excellent responses to Botox injection. I again discussed more definitive therapy for achalasia including balloon dilation and myotomy. He is interested in pursuing the latter.  Recommendations #1 repeat esophageal manometry; if the exam is more definitive I will refer him for surgical evaluation

## 2012-05-08 NOTE — Assessment & Plan Note (Signed)
Per infectious disease 

## 2012-05-08 NOTE — Assessment & Plan Note (Signed)
Etiology of her pain is still unclear. He has what sounds like transient obstructive symptoms but negative CT in the past. Last colonoscopy was 2008. Lab work is notable for normal CBC but with microscopic indices.  Recommendations #1 repeat colonoscopy with the intention of intubating the small bowel #2 to consider small bowel series pending results of colonoscopy

## 2012-05-12 ENCOUNTER — Encounter: Payer: Self-pay | Admitting: Gastroenterology

## 2012-05-19 ENCOUNTER — Telehealth: Payer: Self-pay

## 2012-05-19 ENCOUNTER — Encounter (HOSPITAL_COMMUNITY): Payer: Self-pay | Admitting: Gastroenterology

## 2012-05-19 ENCOUNTER — Ambulatory Visit (HOSPITAL_COMMUNITY)
Admission: RE | Admit: 2012-05-19 | Discharge: 2012-05-19 | Disposition: A | Discharge status: Home / Self Care | Payer: Medicaid Other | Source: Ambulatory Visit | Attending: Gastroenterology | Admitting: Gastroenterology

## 2012-05-19 ENCOUNTER — Encounter (HOSPITAL_COMMUNITY)
Admission: RE | Disposition: A | Discharge status: Home / Self Care | Payer: Self-pay | Source: Ambulatory Visit | Attending: Gastroenterology

## 2012-05-19 ENCOUNTER — Other Ambulatory Visit: Payer: Self-pay | Admitting: Infectious Diseases

## 2012-05-19 DIAGNOSIS — B20 Human immunodeficiency virus [HIV] disease: Secondary | ICD-10-CM

## 2012-05-19 DIAGNOSIS — R1032 Left lower quadrant pain: Secondary | ICD-10-CM

## 2012-05-19 DIAGNOSIS — R109 Unspecified abdominal pain: Secondary | ICD-10-CM | POA: Insufficient documentation

## 2012-05-19 DIAGNOSIS — R1310 Dysphagia, unspecified: Secondary | ICD-10-CM | POA: Insufficient documentation

## 2012-05-19 DIAGNOSIS — Z113 Encounter for screening for infections with a predominantly sexual mode of transmission: Secondary | ICD-10-CM

## 2012-05-19 HISTORY — PX: COLONOSCOPY: SHX5424

## 2012-05-19 SURGERY — COLONOSCOPY
Anesthesia: Moderate Sedation

## 2012-05-19 MED ORDER — DIPHENHYDRAMINE HCL 50 MG/ML IJ SOLN
INTRAMUSCULAR | Status: AC
  Filled 2012-05-19: qty 1

## 2012-05-19 MED ORDER — FENTANYL CITRATE 0.05 MG/ML IJ SOLN
INTRAMUSCULAR | Status: AC
  Filled 2012-05-19: qty 2

## 2012-05-19 MED ORDER — HYOSCYAMINE SULFATE 0.125 MG SL SUBL
0.2500 mg | SUBLINGUAL_TABLET | Freq: Once | SUBLINGUAL | Status: DC
  Filled 2012-05-19: qty 2

## 2012-05-19 MED ORDER — MIDAZOLAM HCL 5 MG/5ML IJ SOLN
INTRAMUSCULAR | Status: DC | PRN
  Administered 2012-05-19 (×2): 2 mg via INTRAVENOUS
  Administered 2012-05-19: 1 mg via INTRAVENOUS
  Administered 2012-05-19: 2 mg via INTRAVENOUS
  Administered 2012-05-19: 1 mg via INTRAVENOUS
  Administered 2012-05-19: 2 mg via INTRAVENOUS

## 2012-05-19 MED ORDER — SODIUM CHLORIDE 0.9 % IV SOLN
INTRAVENOUS | Status: DC
  Administered 2012-05-19: 500 mL via INTRAVENOUS

## 2012-05-19 MED ORDER — MIDAZOLAM HCL 10 MG/2ML IJ SOLN
INTRAMUSCULAR | Status: AC
  Filled 2012-05-19: qty 2

## 2012-05-19 MED ORDER — FENTANYL CITRATE 0.05 MG/ML IJ SOLN
INTRAMUSCULAR | Status: DC | PRN
  Administered 2012-05-19: 15 ug via INTRAVENOUS
  Administered 2012-05-19: 10 ug via INTRAVENOUS
  Administered 2012-05-19 (×2): 25 ug via INTRAVENOUS

## 2012-05-19 NOTE — Telephone Encounter (Signed)
Message copied by Chrystie Nose on Fri May 19, 2012 11:17 AM ------      Message from: [name] D      Created: Fri May 19, 2012 11:00 AM       Please schedule small bowel follow-through

## 2012-05-19 NOTE — Progress Notes (Signed)
Pt. Complained of abdominal pain. Dr. Arlyce Dice notified and new orders received. After movement and passing of gas patient expressed relief and medication no longer needed.

## 2012-05-19 NOTE — Telephone Encounter (Signed)
Pt scheduled for small bowel follow-through at [facility] 05/29/12 arrival time 10:15am for a 10:30am appt. Pt to be NPO after midnight. Pt aware of appt date and time.

## 2012-05-19 NOTE — H&P (View-Only) (Signed)
History of Present Illness:  Benjamin Herrera has returned following Botox injection. Dysphagia is significantly improved. He has some dysphagia to clear liquids but not to solids. He swallows soda without difficulty.  He continues to complain of intermittent moderately severe left lower quadrant pain. This seems to be crampy and lasts minutes at a time. It is unresponsive to anticholinergics. Prior workup including CT scan was unremarkable. Last colonoscopy was in 2008.    Review of Systems: Pertinent positive and negative review of systems were noted in the above HPI section. All other review of systems were otherwise negative.    Current Medications, Allergies, Past Medical History, Past Surgical History, Family History and Social History were reviewed in [facility] electronic medical record  Vital signs were reviewed in today's medical record. Physical Exam: General: Well developed , well nourished, no acute distress Abdomen is without masses, tenderness organomegaly    

## 2012-05-19 NOTE — Op Note (Signed)
[facility] [address] [place], [place]  [address]  COLONOSCOPY PROCEDURE REPORT  PATIENT:  Benjamin Herrera, Benjamin Herrera  MR#:  [id number] BIRTHDATE:  05-09-1966, 45 yrs. old  GENDER:  male ENDOSCOPIST:  Barbette Hair. Arlyce Dice, MD REF. BY: PROCEDURE DATE:  05/19/2012 PROCEDURE:  Diagnostic Colonoscopy ASA CLASS:  Class II INDICATIONS:  Abdominal pain MEDICATIONS:   These medications were titrated to patient response per physician's verbal order, Fentanyl 75 mcg IV, Versed 10 mg IV  DESCRIPTION OF PROCEDURE:   After the risks benefits and alternatives of the procedure were thoroughly explained, informed consent was obtained.  Digital rectal exam was performed and revealed no abnormalities.   The Pentax Colonoscope [id number] endoscope was introduced through the anus and advanced to the cecum, which was identified by both the appendix and ileocecal valve, without limitations.  The quality of the prep was excellent, using MoviPrep.  The instrument was then slowly withdrawn as the colon was fully examined. <<PROCEDUREIMAGES>>  FINDINGS:  A normal appearing cecum, ileocecal valve, and appendiceal orifice were identified. The ascending, hepatic flexure, transverse, splenic flexure, descending, sigmoid colon, and rectum appeared unremarkable (see image1, image3, and image4). Unable to intubate the small intestine   Retroflexed views in the rectum revealed no abnormalities.    The time to cecum =  minutes. The scope was then withdrawn in  1) 10.0  minutes from the cecum and the procedure completed. COMPLICATIONS:  None ENDOSCOPIC IMPRESSION: 1) Normal colon RECOMMENDATIONS: 1) My office will arrange for you to have a Small Bowel Follow Through examintion. This is a radiology test that gives information about your small intestine. REPEAT EXAM:  No  ______________________________ Barbette Hair. Arlyce Dice, MD  CC:  n. eSIGNED:   Barbette Hair. Kaplan at 05/19/2012 10:58 AM  Rayetta Pigg,  [phone]

## 2012-05-19 NOTE — Interval H&P Note (Signed)
History and Physical Interval Note:  05/19/2012 10:05 AM  Benjamin Herrera  has presented today for surgery, with the diagnosis of colitis follow up  The various methods of treatment have been discussed with the patient and family. After consideration of risks, benefits and other options for treatment, the patient has consented to  Procedure(s) (LRB): COLONOSCOPY (N/A) as a surgical intervention .  The patient's history has been reviewed, patient examined, no change in status, stable for surgery.  I have reviewed the patient's chart and labs.  Questions were answered to the patient's satisfaction.    The recent H&P (dated *05/08/12**) was reviewed, the patient was examined and there is no change in the patients condition since that H&P was completed.   [name]  05/19/2012, 10:05 AM    [name]

## 2012-05-22 ENCOUNTER — Encounter (HOSPITAL_COMMUNITY): Payer: Self-pay | Admitting: Gastroenterology

## 2012-05-26 ENCOUNTER — Other Ambulatory Visit (HOSPITAL_COMMUNITY): Payer: Medicaid Other

## 2012-05-29 ENCOUNTER — Other Ambulatory Visit (INDEPENDENT_AMBULATORY_CARE_PROVIDER_SITE_OTHER): Payer: Medicaid Other

## 2012-05-29 ENCOUNTER — Other Ambulatory Visit: Payer: Self-pay | Admitting: Infectious Diseases

## 2012-05-29 ENCOUNTER — Ambulatory Visit (HOSPITAL_COMMUNITY)
Admission: RE | Admit: 2012-05-29 | Discharge: 2012-05-29 | Disposition: A | Discharge status: Home / Self Care | Payer: Medicaid Other | Source: Ambulatory Visit | Attending: Gastroenterology | Admitting: Gastroenterology

## 2012-05-29 ENCOUNTER — Other Ambulatory Visit (HOSPITAL_COMMUNITY)
Admission: RE | Admit: 2012-05-29 | Discharge: 2012-05-29 | Disposition: A | Discharge status: Home / Self Care | Payer: Medicaid Other | Source: Ambulatory Visit | Attending: Infectious Diseases | Admitting: Infectious Diseases

## 2012-05-29 ENCOUNTER — Encounter (HOSPITAL_COMMUNITY)
Admission: RE | Disposition: A | Discharge status: Home / Self Care | Payer: Self-pay | Source: Ambulatory Visit | Attending: Gastroenterology

## 2012-05-29 DIAGNOSIS — Z113 Encounter for screening for infections with a predominantly sexual mode of transmission: Secondary | ICD-10-CM

## 2012-05-29 DIAGNOSIS — B20 Human immunodeficiency virus [HIV] disease: Secondary | ICD-10-CM

## 2012-05-29 DIAGNOSIS — K220 Achalasia of cardia: Secondary | ICD-10-CM | POA: Insufficient documentation

## 2012-05-29 HISTORY — PX: ESOPHAGEAL MANOMETRY: SHX5429

## 2012-05-29 LAB — CBC WITH DIFFERENTIAL/PLATELET
Eosinophils Absolute: 0.2 10*3/uL (ref 0.0–0.7)
Lymphocytes Relative: 53 % — ABNORMAL HIGH (ref 12–46)
Lymphs Abs: 2.8 10*3/uL (ref 0.7–4.0)
Neutrophils Relative %: 37 % — ABNORMAL LOW (ref 43–77)
Platelets: 179 10*3/uL (ref 150–400)
RBC: 5.42 MIL/uL (ref 4.22–5.81)
WBC: 5.3 10*3/uL (ref 4.0–10.5)

## 2012-05-29 LAB — COMPREHENSIVE METABOLIC PANEL
ALT: 17 U/L (ref 0–53)
AST: 18 U/L (ref 0–37)
CO2: 25 mEq/L (ref 19–32)
Sodium: 137 mEq/L (ref 135–145)
Total Bilirubin: 0.4 mg/dL (ref 0.3–1.2)
Total Protein: 7.2 g/dL (ref 6.0–8.3)

## 2012-05-29 SURGERY — MANOMETRY, ESOPHAGUS

## 2012-05-29 MED ORDER — LIDOCAINE VISCOUS 2 % MT SOLN
OROMUCOSAL | Status: AC
  Filled 2012-05-29: qty 15

## 2012-05-30 ENCOUNTER — Encounter (HOSPITAL_COMMUNITY): Payer: Self-pay | Admitting: Gastroenterology

## 2012-05-31 ENCOUNTER — Other Ambulatory Visit: Payer: Self-pay | Admitting: Infectious Diseases

## 2012-05-31 DIAGNOSIS — B20 Human immunodeficiency virus [HIV] disease: Secondary | ICD-10-CM

## 2012-05-31 LAB — HIV-1 RNA QUANT-NO REFLEX-BLD: HIV 1 RNA Quant: 2049 copies/mL — ABNORMAL HIGH (ref <20)

## 2012-05-31 NOTE — Addendum Note (Signed)
Addended by: Mariea Clonts D on: 05/31/2012 02:55 PM   Modules accepted: Orders

## 2012-06-02 ENCOUNTER — Encounter: Payer: Self-pay | Admitting: Gastroenterology

## 2012-06-02 DIAGNOSIS — K220 Achalasia of cardia: Secondary | ICD-10-CM

## 2012-06-02 NOTE — Progress Notes (Unsigned)
Patient ID: Benjamin Herrera, male   DOB: 06-26-1966, 46 y.o.   MRN: [id number] Esophageal manometry report performed on May 29, 2012  Esophageal manometry was performed in the usual pullback method.  Findings  #1 resting LES pressure was 31.2 mm. 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 Diabetes are suggestive but not diagnostic for achalasia.  Recommendations #1 Botox injection as needed #2 to consider referral to Dr. Claire Shown at [facility] for a second opinion

## 2012-06-07 ENCOUNTER — Telehealth: Payer: Self-pay | Admitting: *Deleted

## 2012-06-07 LAB — HIV-1 GENOTYPR PLUS

## 2012-06-07 NOTE — Telephone Encounter (Signed)
Message copied by Macy Mis on Wed Jun 07, 2012  2:07 PM ------      Message from: HATCHER, JEFFREY C      Created: Wed Jun 07, 2012  1:15 PM       Needs visit to change meds, asap

## 2012-06-07 NOTE — Telephone Encounter (Signed)
Patient notified and given appt with Dr. Ninetta Lights for Friday, August 16 at 11:45 AM Wendall Mola CMA

## 2012-06-08 ENCOUNTER — Encounter: Payer: Self-pay | Admitting: Gastroenterology

## 2012-06-09 ENCOUNTER — Encounter: Payer: Self-pay | Admitting: Infectious Diseases

## 2012-06-09 ENCOUNTER — Ambulatory Visit (INDEPENDENT_AMBULATORY_CARE_PROVIDER_SITE_OTHER): Payer: Medicaid Other | Admitting: Infectious Diseases

## 2012-06-09 VITALS — BP 123/82 | HR 80 | Temp 97.6°F | Ht 73.5 in | Wt 212.0 lb

## 2012-06-09 DIAGNOSIS — L0233 Carbuncle of buttock: Secondary | ICD-10-CM

## 2012-06-09 DIAGNOSIS — L0232 Furuncle of buttock: Secondary | ICD-10-CM

## 2012-06-09 DIAGNOSIS — B20 Human immunodeficiency virus [HIV] disease: Secondary | ICD-10-CM

## 2012-06-09 MED ORDER — RITONAVIR 80 MG/ML PO SOLN: 100.0000 mg | Freq: Every day | ORAL | Status: DC

## 2012-06-09 MED ORDER — DARUNAVIR ETHANOLATE 800 MG PO TABS: 800.0000 mg | ORAL_TABLET | Freq: Every day | ORAL | Status: DC

## 2012-06-09 NOTE — Assessment & Plan Note (Signed)
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 Will have him seen by surgery.

## 2012-06-09 NOTE — Assessment & Plan Note (Signed)
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 Will add DRVr to his medications. Has concerns about pill size, will give him liquids. Will see him back in 1 month.

## 2012-06-09 NOTE — Progress Notes (Signed)
  Subjective:    Patient ID: Benjamin Herrera, male    DOB: [birth year], 46 y.o.   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 Taking TRV/ISN. Has previously gotten botox injections into stomach to improve abd pain. Still has some problems swallowing- feels like food and liquids get stuck. Is going to have surgery and his stomach muscle cut.  Had colonoscopy 05-19-12 for f/u of his colitis- is awaiting his results.  Is having severe pain in his R shoulder- has screws there and feels like pain is similar to previous problem he had with screws. Has f/u at [facility]- pain so bad last PM he was not able to sleep.  Is seen today as he had detectable virus and a genotype on 05-29-12 showing M184V. Denies missed doses or timing difficulties.   Lesion on his gluteal fold has not healed, has put cortisone on is but has multiplied (prev 1 now 2).   HIV 1 RNA Quant (copies/mL)  Date Value  05/29/2012 2049*  12/27/2011 318*  09/20/2011 136*     CD4 T Cell Abs (cmm)  Date Value  05/29/2012 270*  12/27/2011 250*  09/20/2011 250*      Review of Systems  Constitutional: Negative for unexpected weight change.  Gastrointestinal: Negative for diarrhea and constipation.  Genitourinary: Negative for dysuria.       Objective:   Physical Exam  Constitutional: He appears well-developed and well-nourished.  Cardiovascular: Normal rate, regular rhythm and normal heart sounds.   Pulmonary/Chest: Effort normal and breath sounds normal. No respiratory distress.  Abdominal: Soft. Bowel sounds are normal. He exhibits no distension.  Genitourinary:             Assessment & Plan:

## 2012-06-12 ENCOUNTER — Ambulatory Visit: Payer: Medicaid Other | Admitting: Infectious Diseases

## 2012-06-14 ENCOUNTER — Other Ambulatory Visit: Payer: Self-pay | Admitting: *Deleted

## 2012-06-14 ENCOUNTER — Telehealth: Payer: Self-pay | Admitting: *Deleted

## 2012-06-14 ENCOUNTER — Telehealth: Payer: Self-pay | Admitting: Gastroenterology

## 2012-06-14 DIAGNOSIS — B20 Human immunodeficiency virus [HIV] disease: Secondary | ICD-10-CM

## 2012-06-14 MED ORDER — DARUNAVIR ETHANOLATE 800 MG PO TABS: 800.0000 mg | ORAL_TABLET | Freq: Every day | ORAL | Status: DC

## 2012-06-14 MED ORDER — RALTEGRAVIR POTASSIUM 400 MG PO TABS: 400.0000 mg | ORAL_TABLET | Freq: Two times a day (BID) | ORAL | Status: DC

## 2012-06-14 MED ORDER — EMTRICITABINE-TENOFOVIR DF 200-300 MG PO TABS: 1.0000 | ORAL_TABLET | Freq: Every day | ORAL | Status: DC

## 2012-06-14 MED ORDER — RITONAVIR 100 MG PO TABS: 100.0000 mg | ORAL_TABLET | Freq: Every day | ORAL | Status: DC

## 2012-06-14 NOTE — Telephone Encounter (Signed)
Pts manometry report from 06/02/12 states to consider referral to Dr. Alycia Rossetti. Pt wants to know if he is supposed to be referred to see this physician. Please advise.

## 2012-06-14 NOTE — Telephone Encounter (Signed)
Patient called stating he can not tolerate the liquid Norvir and requests to be switched back to tablets.  Rx for tabs sent to [facility]. Wendall Mola CMA

## 2012-06-19 ENCOUNTER — Encounter: Payer: Self-pay | Admitting: Gastroenterology

## 2012-06-19 NOTE — Telephone Encounter (Signed)
Yes, he needs referral to Dr.Koch.  Please see my referral letter that I sent to you.

## 2012-06-21 NOTE — Telephone Encounter (Signed)
There is no rush. We can continue Botox injections as needed until he can be seen.

## 2012-06-21 NOTE — Telephone Encounter (Signed)
[facility] and Dr. Alycia Rossetti has no new pt appts for the rest of the year. Do you want him referred to another provider? Please advise.

## 2012-06-21 NOTE — Telephone Encounter (Signed)
Called to make pt an appt at [facility] with Dr. Alycia Rossetti. No available appts to schedule the pt in at this time. Pt placed on the waiting list for an appt with Dr. Alycia Rossetti.

## 2012-07-03 ENCOUNTER — Encounter (INDEPENDENT_AMBULATORY_CARE_PROVIDER_SITE_OTHER): Payer: Self-pay | Admitting: Surgery

## 2012-07-03 ENCOUNTER — Ambulatory Visit (INDEPENDENT_AMBULATORY_CARE_PROVIDER_SITE_OTHER): Payer: Medicaid Other | Admitting: Surgery

## 2012-07-03 ENCOUNTER — Encounter (INDEPENDENT_AMBULATORY_CARE_PROVIDER_SITE_OTHER): Payer: Self-pay

## 2012-07-03 VITALS — BP 124/78 | HR 80 | Resp 18 | Ht 73.5 in | Wt 224.0 lb

## 2012-07-03 DIAGNOSIS — A630 Anogenital (venereal) warts: Secondary | ICD-10-CM

## 2012-07-03 DIAGNOSIS — K220 Achalasia of cardia: Secondary | ICD-10-CM

## 2012-07-03 DIAGNOSIS — L988 Other specified disorders of the skin and subcutaneous tissue: Secondary | ICD-10-CM

## 2012-07-03 HISTORY — DX: Anogenital (venereal) warts: A63.0

## 2012-07-03 NOTE — Progress Notes (Signed)
Subjective:     Patient ID: Benjamin Herrera, male   DOB: 12/30/1965, 46 y.o.   MRN: [id number]  HPI  Benjamin Herrera  01/12/1966 [id number]  Patient Care Team: Jeffrey C Hatcher, MD as PCP - Infectious Diseases (Infectious Diseases) Robert D Kaplan, MD as Consulting Physician (Gastroenterology) Alexandr Yaworski C. Zsofia Prout, MD as Consulting Physician (General Surgery)  This patient is a 46 y.o.male who presents today for surgical evaluation at the request of Dr. Hatcher.   Reason for evaluation: Mass at base of scrotum.  Perianal drainage.  Patient is an HIV-positive man on chronic anti-retroviral therapy followed by Dr. Hatcher.  He is also followed by Dr. Kaplan for some dysphagia and esophageal dysmotility consistent with probable achalasia.  He is Being sent to [facility] to see Dr. K. Koch for further workup for this.  Controlled with Botox injections at this time.  Patient is noted a lump at the base of his scrotum more towards the anus a few months ago.  Is has gradually gotten larger.  It is uncomfortable.  He also notes he gets drainage and occasional blood when he wipes.  Discomfort more in the tailbone side.  Normally has a bowel movement once or twice a day.  Occasionally loose.  Has had normal colonoscopies in the past.  History of unprotected anal receptive sex in the past.  Does not recall any history of herpes, condyloma, or other sexually transmitted diseases.  No history of anorectal problems.  No fissure or hemorrhoid issues.  No major rectal bleeding.  Some irritation but no burning.  No severe pain with bowel movements.  Because of the mass, he was sent to me for evaluation.  Patient Active Problem List  Diagnosis  . HIV DISEASE  . CANDIDIASIS OF MOUTH  . OTHER CANDIDIASIS OF OTHER SPECIFIED SITES  . SUBSTANCE ABUSE, MULTIPLE  . HYPERTENSION  . ASTHMA, UNSPECIFIED, UNSPECIFIED STATUS  . Achalasia and cardiospasm  . PSORIASIS  . ABDOMINAL PAIN, EPIGASTRIC  . FRACTURE,  HUMERUS  . Abdominal pain, left lower quadrant  . Pilonidal disease  . 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 HIV (human immunodeficiency virus infection)   . Polysubstance abuse   . Boils     under arms  . Hemorrhoids   . Herpes labialis   . Psoriasis   . Achalasia and cardiospasm   . Candidiasis of unspecified site   . Dysphagia   . Asthma   . Candidiasis of mouth   . Fracture, humerus   . Hepatitis     a    Past Surgical History  Procedure Date  . Humeral fracture surgery   . Right shoulder replacement 2008    x 2  . Esophagogastroduodenoscopy 11/11/2011    Procedure: ESOPHAGOGASTRODUODENOSCOPY (EGD);  Surgeon: Robert D Kaplan, MD;  Location: [facility];  Service: Endoscopy;  Laterality: N/A;  botox injection  called Pt to change time of procedure per Dr Kaplan  . Esophagogastroduodenoscopy 03/10/2012    Procedure: ESOPHAGOGASTRODUODENOSCOPY (EGD);  Surgeon: Robert D Kaplan, MD;  Location: [facility];  Service: Endoscopy;  Laterality: N/A;  . Colonoscopy 05/19/2012    Procedure: COLONOSCOPY;  Surgeon: Robert D Kaplan, MD;  Location: [facility];  Service: Endoscopy;  Laterality: N/A;  jill trying to contact pt to come in 0830 for 930 case, phone not accepting messages  . Esophageal manometry 05/29/2012    Procedure: ESOPHAGEAL MANOMETRY (EM);    Surgeon: Robert D Kaplan, MD;  Location: [facility];  Service: Endoscopy;  Laterality: N/A;    History   Social History  . Marital Status: Single    Spouse Name: N/A    Number of Children: N/A  . Years of Education: N/A   Occupational History  . Not on file.   Social History Main Topics  . Smoking status: Current Everyday Smoker -- 0.5 packs/day    Types: Cigarettes  . Smokeless tobacco: Never Used  . Alcohol Use: No  . Drug Use: 7 per week    Special: Marijuana, Cocaine     pt now denies cocaine use   . Sexually Active: Yes -- Male partner(s)     pt.  refused condoms   Other Topics Concern  . Not on file   Social History Narrative  . No narrative on file    Family History  Problem Relation Age of Onset  . Colon cancer Neg Hx   . Stomach cancer Maternal Grandmother     ?  . Diabetes Maternal Grandmother     Current Outpatient Prescriptions  Medication Sig Dispense Refill  . albuterol (PROVENTIL HFA;VENTOLIN HFA) 108 (90 BASE) MCG/ACT inhaler Inhale 2 puffs into the lungs every 6 (six) hours as needed. For wheezing or shortness of breath  18 g  3  . Darunavir Ethanolate (PREZISTA) 800 MG tablet Take 1 tablet (800 mg total) by mouth daily.  30 tablet  6  . emtricitabine-tenofovir (TRUVADA) 200-300 MG per tablet Take 1 tablet by mouth daily with breakfast.  30 tablet  6  . raltegravir (ISENTRESS) 400 MG tablet Take 1 tablet (400 mg total) by mouth 2 (two) times daily.  60 tablet  6  . ritonavir (NORVIR) 100 MG TABS Take 1 tablet (100 mg total) by mouth daily.  30 tablet  6     No Known Allergies  BP 124/78  Pulse 80  Resp 18  Ht 6' 1.5" (1.867 m)  Wt 224 lb (101.606 kg)  BMI 29.15 kg/m2  No results found.   Review of Systems  Constitutional: Negative for fever, chills and diaphoresis.  HENT: Negative for nosebleeds, sore throat, facial swelling, mouth sores, trouble swallowing and ear discharge.   Eyes: Negative for photophobia, discharge and visual disturbance.  Respiratory: Negative for choking, chest tightness, shortness of breath and stridor.   Cardiovascular: Negative for chest pain and palpitations.  Gastrointestinal: Positive for anal bleeding. Negative for nausea, vomiting, abdominal pain, diarrhea, constipation, blood in stool, abdominal distention and rectal pain.  Genitourinary: Negative for dysuria, urgency, difficulty urinating and testicular pain.  Musculoskeletal: Negative for myalgias, back pain, arthralgias and gait problem.  Skin: Negative for color change, pallor, rash and wound.  Neurological:  Negative for dizziness, speech difficulty, weakness, numbness and headaches.  Hematological: Negative for adenopathy. Does not bruise/bleed easily.  Psychiatric/Behavioral: Negative for hallucinations, confusion and agitation.       Objective:   Physical Exam  Constitutional: He is oriented to person, place, and time. He appears well-developed and well-nourished. No distress.  HENT:  Head: Normocephalic.  Mouth/Throat: Oropharynx is clear and moist. No oropharyngeal exudate.  Eyes: Conjunctivae and EOM are normal. Pupils are equal, round, and reactive to light. No scleral icterus.  Neck: Normal range of motion. Neck supple. No tracheal deviation present.  Cardiovascular: Normal rate, regular rhythm and intact distal pulses.   Pulmonary/Chest: Effort normal and breath sounds normal. No respiratory distress.  Abdominal: Soft. He exhibits no distension. There is no   tenderness. Hernia confirmed negative in the right inguinal area and confirmed negative in the left inguinal area.  Genitourinary: Penis normal. No penile tenderness.          Exam done with assistance of male Medical Assistant in the room.  Perianal skin clean with good hygiene.  No pruritis.  No fissure.  No abscess/fistula.    Tolerates digital and anoscopic rectal exam.  Normal sphincter tone.  Hemorrhoidal piles mildly enlarged   Musculoskeletal: Normal range of motion. He exhibits no tenderness.  Lymphadenopathy:    He has no cervical adenopathy.       Right: No inguinal adenopathy present.       Left: No inguinal adenopathy present.  Neurological: He is alert and oriented to person, place, and time. No cranial nerve deficit. He exhibits normal muscle tone. Coordination normal.  Skin: Skin is warm and dry. No rash noted. He is not diaphoretic. No erythema. No pallor.  Psychiatric: He has a normal mood and affect. His behavior is normal. Judgment and thought content normal.       Assessment:     1.  Scrotal &  anal canal masses suspicious for condylomata  2.  Pilonidal disease  3.  Esophageal dysmotility, probable atypical/incomplete achalasia    Plan:     I think he require surgery.  I would remove the scrotal mass.  Probably oblique the anal canal masses.  I also think he would benefit from excision of a pilonidal disease.  I discussed the procedures with him:  The anatomy & physiology of the anorectal region was discussed.  The pathophysiology of anorectal warts and differential diagnosis was discussed.  Natural history risks without surgery was discussed such as further growth and cancer.   I stressed the importance of office follow-up to catch early recurrence & minimize/halt progression of disease.  Interventions such as cauterization by topical agents were discussed.  The patient's symptoms are not adequately controlled by non-operative treatments.  I feel the risks & problems of no surgery outweigh the operative risks; therefore, I recommended surgery to treat the anal warts by removal, ablation and/or cauterization.  Risks such as bleeding, infection, need for further treatment, heart attack, death, and other risks were discussed.   I noted a good likelihood this will help address the problem. Goals of post-operative recovery were discussed as well.  Possibility that this will not correct all symptoms was explained.  Post-operative pain, bleeding, constipation, and other problems after surgery were discussed.  We will work to minimize complications.   Educational handouts further explaining the pathology, treatment options, and bowel regimen were given as well.  Questions were answered.  The patient expresses understanding & wishes to proceed with surgery.  The anatomy of the intragluteal cleft was discussed. Pathophysiology of pilonidal disease was discussed. The importance of keeping hairs trimmed to avoid recurrence was discussed. Discussion of options such as curretage, excision with closure  vs leaving open was discussed. Risks of infection and need for incision and drainage & antibiotics were discussed.  I noted a good likelihood this will help address the problem.    At this point, I think the patient would best served with considering surgery to excise the diseased tissue. I will make an attempt to close but it may need to be left open to allow it to heal with secondary intention and wound packing. Possible recurrences involve muscle flaps or different techniques were discussed as well. I noted that recurrence is higher with poor compliance   on care maintenance and hygiene. The patient's questions were answered. The patient agrees to proceed.  I called Dr. Kaplan.  He is sending the patient for further evaluation of his esophageal issues to [facility] w Dr. Koch.  Should it come to surgery, I can do Heller myotomy &/partial Toupet fundoplication if needed.  I will table that tissue for now.      

## 2012-07-03 NOTE — Patient Instructions (Addendum)
See the Handout(s) we gave you.  Consider surgery.  Please call our office at [phone] if you wish to schedule surgery or if you have further questions / concerns.    Pilonidal Cyst A pilonidal cyst occurs when hairs get trapped (ingrown) beneath the skin in the crease between the buttocks over your sacrum (the bone under that crease). Pilonidal cysts are most common in young men with a lot of body hair. When the cyst is ruptured (breaks) or leaking, fluid from the cyst may cause burning and itching. If the cyst becomes infected, it causes a painful swelling filled with pus (abscess). The pus and trapped hairs need to be removed (often by lancing) so that the infection can heal. However, recurrence is common and an operation may be needed to remove the cyst. HOME CARE INSTRUCTIONS   If the cyst was NOT INFECTED:   Keep the area clean and dry. Bathe or shower daily. Wash the area well with a germ-killing soap. Warm tub baths may help prevent infection and help with drainage. Dry the area well with a towel.   Avoid tight clothing to keep area as moisture free as possible.   Keep area between buttocks as free of hair as possible. A depilatory may be used.   If the cyst WAS INFECTED and needed to be drained:   Your caregiver packed the wound with gauze to keep the wound open. This allows the wound to heal from the inside outwards and continue draining.   Return for a wound check in 1 day or as suggested.   If you take tub baths or showers, repack the wound with gauze following them. Sponge baths (at the sink) are a good alternative.   If an antibiotic was ordered to fight the infection, take as directed.   Only take over-the-counter or prescription medicines for pain, discomfort, or fever as directed by your caregiver.   After the drain is removed, use sitz baths for 20 minutes 4 times per day. Clean the wound gently with mild unscented soap, pat dry, and then apply a dry dressing.    SEEK MEDICAL CARE IF:   You have increased pain, swelling, redness, drainage, or bleeding from the area.   You have a fever.   You have muscles aches, dizziness, or a general ill feeling.  Document Released: 10/08/2000 Document Revised: 09/30/2011 Document Reviewed: 12/06/2008 [facility] Patient Information 2012 [place], [place].  Genital Warts Genital warts are a sexually transmitted infection. They may appear as small bumps on the tissues of the genital area. CAUSES  Genital warts are caused by a virus called human papillomavirus (HPV). HPV is the most common sexually transmitted disease (STD) and infection of the sex organs. This infection is spread by having unprotected sex with an infected person. It can be spread by vaginal, anal, and oral sex. Many people do not know they are infected. They may be infected for years without problems. However, even if they do not have problems, they can unknowingly pass the infection to their sexual partners. SYMPTOMS   Itching and irritation in the genital area.   Warts that bleed.   Painful sexual intercourse.  DIAGNOSIS  Warts are usually recognized with the naked eye on the vagina, vulva, perineum, anus, and rectum. Certain tests can also diagnose genital warts, such as:  A Pap test.   A tissue sample (biopsy) exam.   Colposcopy. A magnifying tool is used to examine the vagina and cervix. The HPV cells will change  color when certain solutions are used.  TREATMENT  Warts can be removed by:  Applying certain chemicals, such as cantharidin or podophyllin.   Liquid nitrogen freezing (cryotherapy).   Immunotherapy with candida or trichophyton injections.   Laser treatment.   Burning with an electrified probe (electrocautery).   Interferon injections.   Surgery.  PREVENTION  HPV vaccination can help prevent HPV infections that cause genital warts and that cause cancer of the cervix. It is recommended that the vaccination be given to  people between the ages 16 to 48 years old. The vaccine might not work as well or might not work at all if you already have HPV. It should not be given to pregnant women. HOME CARE INSTRUCTIONS   It is important to follow your caregiver's instructions. The warts will not go away without treatment. Repeat treatments are often needed to get rid of warts. Even after it appears that the warts are gone, the normal tissue underneath often remains infected.   Do not try to treat genital warts with medicine used to treat hand warts. This type of medicine is strong and can burn the skin in the genital area, causing more damage.   Tell your past and current sexual partner(s) that you have genital warts. They may be infected also and need treatment.   Avoid sexual contact while being treated.   Do not touch or scratch the warts. The infection may spread to other parts of your body.   Women with genital warts should have a cervical cancer check (Pap test) at least once a year. This type of cancer is slow-growing and can be cured if found early. Chances of developing cervical cancer are increased with HPV.   Inform your obstetrician about your warts in the event of pregnancy. This virus can be passed to the baby's respiratory tract. Discuss this with your caregiver.   Use a condom during sexual intercourse. Following treatment, the use of condoms will help prevent reinfection.   Ask your caregiver about using over-the-counter anti-itch creams.  SEEK MEDICAL CARE IF:   Your treated skin becomes red, swollen, or painful.   You have a fever.   You feel generally ill.   You feel little lumps in and around your genital area.   You are bleeding or have painful sexual intercourse.  MAKE SURE YOU:   Understand these instructions.   Will watch your condition.   Will get help right away if you are not doing well or get worse.  Document Released: 10/08/2000 Document Revised: 09/30/2011 Document  Reviewed: 04/19/2011 [facility] Patient Information 2012 [place], [place].

## 2012-07-12 ENCOUNTER — Encounter (HOSPITAL_COMMUNITY): Payer: Self-pay | Admitting: Pharmacy Technician

## 2012-07-18 ENCOUNTER — Encounter (HOSPITAL_COMMUNITY): Payer: Self-pay

## 2012-07-18 ENCOUNTER — Encounter (HOSPITAL_COMMUNITY)
Admission: RE | Admit: 2012-07-18 | Discharge: 2012-07-18 | Disposition: A | Discharge status: Home / Self Care | Payer: Medicaid Other | Source: Ambulatory Visit | Attending: Surgery | Admitting: Surgery

## 2012-07-18 LAB — SURGICAL PCR SCREEN
MRSA, PCR: NEGATIVE
Staphylococcus aureus: POSITIVE — AB

## 2012-07-18 LAB — CBC
HCT: 43.7 % (ref 39.0–52.0)
Hemoglobin: 14.5 g/dL (ref 13.0–17.0)
MCHC: 33.2 g/dL (ref 30.0–36.0)
RDW: 15.6 % — ABNORMAL HIGH (ref 11.5–15.5)
WBC: 5.2 10*3/uL (ref 4.0–10.5)

## 2012-07-18 LAB — COMPREHENSIVE METABOLIC PANEL
ALT: 13 U/L (ref 0–53)
Albumin: 3.7 g/dL (ref 3.5–5.2)
Alkaline Phosphatase: 89 U/L (ref 39–117)
BUN: 11 mg/dL (ref 6–23)
Chloride: 103 mEq/L (ref 96–112)
Glucose, Bld: 77 mg/dL (ref 70–99)
Potassium: 4.1 mEq/L (ref 3.5–5.1)
Sodium: 139 mEq/L (ref 135–145)
Total Bilirubin: 0.2 mg/dL — ABNORMAL LOW (ref 0.3–1.2)
Total Protein: 7.3 g/dL (ref 6.0–8.3)

## 2012-07-18 NOTE — Patient Instructions (Signed)
20      Your procedure is scheduled on:  Monday 07/24/2012 at 1 pm  Report to [facility] at 1030  AM.  Call this number if you have problems the morning of surgery: [phone]   Remember:   Do not eat food or drink liquids after midnight!  Take these medicines the morning of surgery with A SIP OF WATER: Truvada,Isentress, Prezista, Norvir, ALbuterol if needed   Do not bring valuables to the hospital.  .  Leave suitcase in the car. After surgery it may be brought to your room.  For patients admitted to the hospital, checkout time is 11:00 AM the day of              Discharge.    Special Instructions: See [facility] Preparing  For Surgery Instruction Sheet  Do not wear jewelry, lotions powders, perfumes. Women do not shave  legs or underarms for 12 hours before showers. Contacts, partial plates, or dentures may not be worn into surgery.                          Patients discharged the day of surgery will not be allowed to drive home.  If going home the same day of surgery, must have someone stay with you first 24 hrs.at home and arrange for someone to drive you home from the              Hospital.   Please read over the following fact sheets that you were given: MRSA              INFORMATION, sleep apnea sheet, Incentive Spirometry sheet               Telford Nab.Adna Nofziger,RN,BSN [phone]

## 2012-07-20 ENCOUNTER — Telehealth (INDEPENDENT_AMBULATORY_CARE_PROVIDER_SITE_OTHER): Payer: Self-pay | Admitting: General Surgery

## 2012-07-20 NOTE — Telephone Encounter (Signed)
Spoke with patient. He does not wish to delay surgery. He will find someone to stay with him overnight. Will call back with any other concerns.

## 2012-07-20 NOTE — Telephone Encounter (Signed)
Message copied by Liliana Cline on Thu Jul 20, 2012 10:03 AM ------      Message from: Marnette Burgess      Created: Thu Jul 20, 2012  8:57 AM      Contact: [phone]       Elease Hashimoto,                  Am I suppose to call him, or are you?            Elane Fritz      ----- Message -----         From: [name], MD         Sent: 07/20/2012   8:23 AM           To: Marnette Burgess            I cannot guarantee that overnight stay is indicated.  Can delay surgery until he has someone to help him at home            ----- Message -----         From: Marnette Burgess         Sent: 07/19/2012   2:24 PM           To: [name], MD, Ethlyn Gallery, CMA            Patient is scheduled for sx on Monday, 07/24/12, and would like to stay over night since he won't have anyone to take care of him the 1st night after sx, please call.

## 2012-07-24 ENCOUNTER — Encounter (HOSPITAL_COMMUNITY): Payer: Self-pay | Admitting: Anesthesiology

## 2012-07-24 ENCOUNTER — Encounter (HOSPITAL_COMMUNITY)
Admission: RE | Disposition: A | Discharge status: Home / Self Care | Payer: Self-pay | Source: Ambulatory Visit | Attending: Surgery

## 2012-07-24 ENCOUNTER — Ambulatory Visit (HOSPITAL_COMMUNITY): Payer: Medicaid Other | Admitting: Anesthesiology

## 2012-07-24 ENCOUNTER — Ambulatory Visit (HOSPITAL_COMMUNITY)
Admission: RE | Admit: 2012-07-24 | Discharge: 2012-07-24 | Disposition: A | Discharge status: Home / Self Care | Payer: Medicaid Other | Source: Ambulatory Visit | Attending: Surgery | Admitting: Surgery

## 2012-07-24 ENCOUNTER — Encounter (HOSPITAL_COMMUNITY): Payer: Self-pay | Admitting: *Deleted

## 2012-07-24 DIAGNOSIS — Z01812 Encounter for preprocedural laboratory examination: Secondary | ICD-10-CM | POA: Insufficient documentation

## 2012-07-24 DIAGNOSIS — A630 Anogenital (venereal) warts: Secondary | ICD-10-CM

## 2012-07-24 DIAGNOSIS — C44529 Squamous cell carcinoma of skin of other part of trunk: Secondary | ICD-10-CM | POA: Insufficient documentation

## 2012-07-24 DIAGNOSIS — N508 Other specified disorders of male genital organs: Secondary | ICD-10-CM | POA: Insufficient documentation

## 2012-07-24 DIAGNOSIS — B078 Other viral warts: Secondary | ICD-10-CM | POA: Insufficient documentation

## 2012-07-24 DIAGNOSIS — Z21 Asymptomatic human immunodeficiency virus [HIV] infection status: Secondary | ICD-10-CM | POA: Insufficient documentation

## 2012-07-24 DIAGNOSIS — B20 Human immunodeficiency virus [HIV] disease: Secondary | ICD-10-CM

## 2012-07-24 DIAGNOSIS — J45909 Unspecified asthma, uncomplicated: Secondary | ICD-10-CM

## 2012-07-24 DIAGNOSIS — L0591 Pilonidal cyst without abscess: Secondary | ICD-10-CM

## 2012-07-24 DIAGNOSIS — D0760 Carcinoma in situ of unspecified male genital organs: Secondary | ICD-10-CM | POA: Insufficient documentation

## 2012-07-24 HISTORY — PX: WART FULGURATION: SHX5245

## 2012-07-24 HISTORY — PX: EXAMINATION UNDER ANESTHESIA: SHX1540

## 2012-07-24 HISTORY — PX: PILONIDAL CYST EXCISION: SHX744

## 2012-07-24 SURGERY — EXCISION, SIMPLE PILONIDAL CYST
Anesthesia: General | Wound class: Contaminated

## 2012-07-24 MED ORDER — ROCURONIUM BROMIDE 100 MG/10ML IV SOLN
INTRAVENOUS | Status: DC | PRN
  Administered 2012-07-24: 50 mg via INTRAVENOUS

## 2012-07-24 MED ORDER — CEFOXITIN SODIUM-DEXTROSE 1-4 GM-% IV SOLR (PREMIX)
INTRAVENOUS | Status: AC
  Filled 2012-07-24: qty 100

## 2012-07-24 MED ORDER — ONDANSETRON HCL 4 MG/2ML IJ SOLN
4.0000 mg | Freq: Four times a day (QID) | INTRAMUSCULAR | Status: DC | PRN
  Administered 2012-07-24: 4 mg via INTRAVENOUS
  Filled 2012-07-24: qty 2

## 2012-07-24 MED ORDER — OXYCODONE HCL 5 MG PO TABS
5.0000 mg | ORAL_TABLET | ORAL | Status: DC | PRN
  Administered 2012-07-24: 5 mg via ORAL
  Filled 2012-07-24: qty 1

## 2012-07-24 MED ORDER — LACTATED RINGERS IV SOLN: INTRAVENOUS | Status: DC

## 2012-07-24 MED ORDER — LACTATED RINGERS IV SOLN
INTRAVENOUS | Status: DC | PRN
  Administered 2012-07-24 (×2): via INTRAVENOUS

## 2012-07-24 MED ORDER — SODIUM CHLORIDE 0.9 % IJ SOLN: 3.0000 mL | Freq: Two times a day (BID) | INTRAMUSCULAR | Status: DC

## 2012-07-24 MED ORDER — PROPOFOL 10 MG/ML IV BOLUS
INTRAVENOUS | Status: DC | PRN
  Administered 2012-07-24: 20 mg via INTRAVENOUS
  Administered 2012-07-24: 200 mg via INTRAVENOUS
  Administered 2012-07-24: 20 mg via INTRAVENOUS

## 2012-07-24 MED ORDER — PROMETHAZINE HCL 25 MG/ML IJ SOLN: 6.2500 mg | INTRAMUSCULAR | Status: DC | PRN

## 2012-07-24 MED ORDER — ONDANSETRON HCL 4 MG/2ML IJ SOLN
INTRAMUSCULAR | Status: DC | PRN
  Administered 2012-07-24: 4 mg via INTRAVENOUS

## 2012-07-24 MED ORDER — GLYCOPYRROLATE 0.2 MG/ML IJ SOLN
INTRAMUSCULAR | Status: DC | PRN
  Administered 2012-07-24: 0.6 mg via INTRAVENOUS

## 2012-07-24 MED ORDER — SODIUM CHLORIDE 0.9 % IV SOLN: 250.0000 mL | INTRAVENOUS | Status: DC | PRN

## 2012-07-24 MED ORDER — DEXTROSE 5 % IV SOLN
2.0000 g | Freq: Four times a day (QID) | INTRAVENOUS | Status: AC
  Filled 2012-07-24: qty 2

## 2012-07-24 MED ORDER — CEFOXITIN SODIUM 2 G IV SOLR
2.0000 g | INTRAVENOUS | Status: DC | PRN
  Administered 2012-07-24: 2 g via INTRAVENOUS

## 2012-07-24 MED ORDER — FENTANYL CITRATE 0.05 MG/ML IJ SOLN
INTRAMUSCULAR | Status: DC | PRN
  Administered 2012-07-24 (×3): 50 ug via INTRAVENOUS
  Administered 2012-07-24: 100 ug via INTRAVENOUS

## 2012-07-24 MED ORDER — HYDROMORPHONE HCL PF 1 MG/ML IJ SOLN: 0.2500 mg | INTRAMUSCULAR | Status: DC | PRN

## 2012-07-24 MED ORDER — BUPIVACAINE-EPINEPHRINE 0.25% -1:200000 IJ SOLN
INTRAMUSCULAR | Status: DC | PRN
  Administered 2012-07-24: 50 mL

## 2012-07-24 MED ORDER — LIDOCAINE HCL (CARDIAC) 20 MG/ML IV SOLN
INTRAVENOUS | Status: DC | PRN
  Administered 2012-07-24: 50 mg via INTRAVENOUS

## 2012-07-24 MED ORDER — EPHEDRINE SULFATE 50 MG/ML IJ SOLN
INTRAMUSCULAR | Status: DC | PRN
  Administered 2012-07-24: 2.5 mg via INTRAVENOUS

## 2012-07-24 MED ORDER — OXYCODONE HCL 5 MG PO TABS: 5.0000 mg | ORAL_TABLET | ORAL | Status: DC | PRN

## 2012-07-24 MED ORDER — FENTANYL CITRATE 0.05 MG/ML IJ SOLN: 25.0000 ug | INTRAMUSCULAR | Status: DC | PRN

## 2012-07-24 MED ORDER — ACETAMINOPHEN 650 MG RE SUPP
650.0000 mg | RECTAL | Status: DC | PRN
  Filled 2012-07-24: qty 1

## 2012-07-24 MED ORDER — 0.9 % SODIUM CHLORIDE (POUR BTL) OPTIME
TOPICAL | Status: DC | PRN
  Administered 2012-07-24: 1000 mL

## 2012-07-24 MED ORDER — SODIUM CHLORIDE 0.9 % IJ SOLN: 3.0000 mL | INTRAMUSCULAR | Status: DC | PRN

## 2012-07-24 MED ORDER — ACETAMINOPHEN 325 MG PO TABS: 650.0000 mg | ORAL_TABLET | ORAL | Status: DC | PRN

## 2012-07-24 MED ORDER — MIDAZOLAM HCL 5 MG/5ML IJ SOLN
INTRAMUSCULAR | Status: DC | PRN
  Administered 2012-07-24: 2 mg via INTRAVENOUS

## 2012-07-24 MED ORDER — NEOSTIGMINE METHYLSULFATE 1 MG/ML IJ SOLN
INTRAMUSCULAR | Status: DC | PRN
  Administered 2012-07-24: 4 mg via INTRAVENOUS

## 2012-07-24 MED ORDER — BUPIVACAINE-EPINEPHRINE PF 0.25-1:200000 % IJ SOLN
INTRAMUSCULAR | Status: AC
  Filled 2012-07-24: qty 60

## 2012-07-24 SURGICAL SUPPLY — 49 items
BLADE HEX COATED 2.75 (ELECTRODE) ×2 IMPLANT
BLADE SURG 15 STRL LF DISP TIS (BLADE) ×2 IMPLANT
BLADE SURG 15 STRL SS (BLADE) ×4
BRIEF STRETCH FOR OB PAD LRG (UNDERPADS AND DIAPERS) ×2 IMPLANT
CANISTER SUCTION 2500CC (MISCELLANEOUS) ×2 IMPLANT
CHLORAPREP W/TINT 26ML (MISCELLANEOUS) IMPLANT
CLOTH BEACON ORANGE TIMEOUT ST (SAFETY) ×2 IMPLANT
DECANTER SPIKE VIAL GLASS SM (MISCELLANEOUS) ×2 IMPLANT
DRAIN PENROSE 18X1/4 LTX STRL (WOUND CARE) ×2 IMPLANT
DRAPE LAPAROTOMY T 102X78X121 (DRAPES) ×2 IMPLANT
DRSG PAD ABDOMINAL 8X10 ST (GAUZE/BANDAGES/DRESSINGS) IMPLANT
ELECT REM PT RETURN 9FT ADLT (ELECTROSURGICAL) ×2
ELECTRODE REM PT RTRN 9FT ADLT (ELECTROSURGICAL) ×1 IMPLANT
GAUZE SPONGE 4X4 16PLY XRAY LF (GAUZE/BANDAGES/DRESSINGS) ×2 IMPLANT
GLOVE BIOGEL PI IND STRL 7.0 (GLOVE) ×1 IMPLANT
GLOVE BIOGEL PI IND STRL 8 (GLOVE) ×1 IMPLANT
GLOVE BIOGEL PI INDICATOR 7.0 (GLOVE) ×1
GLOVE BIOGEL PI INDICATOR 8 (GLOVE) ×1
GLOVE ECLIPSE 8.0 STRL XLNG CF (GLOVE) ×2 IMPLANT
GLOVE INDICATOR 8.0 STRL GRN (GLOVE) ×2 IMPLANT
GOWN STRL NON-REIN LRG LVL3 (GOWN DISPOSABLE) ×2 IMPLANT
GOWN STRL REIN XL XLG (GOWN DISPOSABLE) ×4 IMPLANT
KIT BASIN OR (CUSTOM PROCEDURE TRAY) ×2 IMPLANT
LEGGING LITHOTOMY PAIR STRL (DRAPES) IMPLANT
NDL SAFETY ECLIPSE 18X1.5 (NEEDLE) IMPLANT
NEEDLE HYPO 18GX1.5 SHARP (NEEDLE)
NEEDLE HYPO 22GX1.5 SAFETY (NEEDLE) ×2 IMPLANT
NS IRRIG 1000ML POUR BTL (IV SOLUTION) ×2 IMPLANT
PACK BASIC VI WITH GOWN DISP (CUSTOM PROCEDURE TRAY) ×2 IMPLANT
PACK LITHOTOMY IV (CUSTOM PROCEDURE TRAY) ×2 IMPLANT
PAD ABD 7.5X8 STRL (GAUZE/BANDAGES/DRESSINGS) IMPLANT
PENCIL BUTTON HOLSTER BLD 10FT (ELECTRODE) ×2 IMPLANT
SPONGE GAUZE 4X4 12PLY (GAUZE/BANDAGES/DRESSINGS) ×2 IMPLANT
SPONGE SURGIFOAM ABS GEL 12-7 (HEMOSTASIS) IMPLANT
STRIP CLOSURE SKIN 1/2X4 (GAUZE/BANDAGES/DRESSINGS) ×2 IMPLANT
SUT CHROMIC 2 0 SH (SUTURE) IMPLANT
SUT CHROMIC 3 0 SH 27 (SUTURE) IMPLANT
SUT MNCRL AB 3-0 PS2 18 (SUTURE) ×2 IMPLANT
SUT MNCRL AB 4-0 PS2 18 (SUTURE) IMPLANT
SUT SILK 2 0 30  PSL (SUTURE)
SUT SILK 2 0 30 PSL (SUTURE) IMPLANT
SUT VIC AB 2-0 SH 27 (SUTURE) ×2
SUT VIC AB 2-0 SH 27X BRD (SUTURE) ×1 IMPLANT
SUT VIC AB 3-0 SH 18 (SUTURE) IMPLANT
SUT VIC AB 4-0 SH 18 (SUTURE) IMPLANT
TAPE UMBILICAL COTTON 1/8X30 (MISCELLANEOUS) ×2 IMPLANT
TOWEL OR 17X26 10 PK STRL BLUE (TOWEL DISPOSABLE) ×4 IMPLANT
TUBING SMOKE EVAC (TUBING) ×2 IMPLANT
YANKAUER SUCT BULB TIP 10FT TU (MISCELLANEOUS) ×2 IMPLANT

## 2012-07-24 NOTE — Op Note (Signed)
07/24/2012  2:32 PM  PATIENT:  Benjamin Herrera  46 y.o. male  Patient Care Team: [name], MD as PCP - Infectious Diseases (Infectious Diseases) [name], MD as Consulting Physician (Gastroenterology) [name], MD as Consulting Physician (General Surgery)  PRE-OPERATIVE DIAGNOSIS:  SCROTAL/ANAL MASSes, PILONIDAL DISEASE  POST-OPERATIVE DIAGNOSIS:  Scrotal & anal masses, pilonidal disease  PROCEDURE:  Procedure(s): CYST EXCISION PILONIDAL SIMPLE Excision of mass midline raphe EXAM UNDER ANESTHESIA FULGURATION ANAL WART  SURGEON:  Surgeon(s): [name], MD  ASSISTANT: none   ANESTHESIA:   none  EBL:  Total I/O In: 1050 [I.V.:1000; IV Piggyback:50] Out: -   Delay start of Pharmacological VTE agent (>24hrs) due to surgical blood loss or risk of bleeding:  no  DRAINS: none   SPECIMEN:  Source of Specimen:  1.  Midline raphe skin mass  2. Pilonidal disease  DISPOSITION OF SPECIMEN:  PATHOLOGY  COUNTS:  YES  PLAN OF CARE: Discharge to home after PACU  PATIENT DISPOSITION:  PACU - hemodynamically stable.  INDICATION: HIV positive male.  Has had a painful nodule posterior to scrotum as well as some drainage off his tailbone.  I was concerned for pilonidal disease as well as a few anal canal warts and an anterior midline atypical mole versus condyloma.  I recommended removal:  The anatomy & physiology of the anorectal region was discussed.  The pathophysiology of anorectal warts and differential diagnosis was discussed.  Natural history risks without surgery was discussed such as further growth and cancer.   I stressed the importance of office follow-up to catch early recurrence & minimize/halt progression of disease.  Interventions such as cauterization by topical agents were discussed.  The patient's symptoms are not adequately controlled by non-operative treatments.  I feel the risks & problems of no surgery outweigh the operative risks;  therefore, I recommended surgery to treat the anal warts by removal, ablation and/or cauterization.  Risks such as bleeding, infection, need for further treatment, heart attack, death, and other risks were discussed.   I noted a good likelihood this will help address the problem. Goals of post-operative recovery were discussed as well.  Possibility that this will not correct all symptoms was explained.  Post-operative pain, bleeding, constipation, and other problems after surgery were discussed.  We will work to minimize complications.   Educational handouts further explaining the pathology, treatment options, and bowel regimen were given as well.  Questions were answered.  The patient expresses understanding & wishes to proceed with surgery.  OR FINDINGS: midline RFL mass probably an elevated mole.  Some anal canal mass is suspicious for condyloma.  Posterior midline presacral blistering with pits suspicious for pilonidal disease.  He available tape drains the superficial layer.  A Penrose drain is in the deep layer.  DESCRIPTION: Informed consent was confirmed. Patient underwent general anesthesia without difficulty. Patient was placed into prone positioning.  The perianal region was prepped and draped in sterile fashion. Surgical tunnel confirmed or plan.  I did digital rectal examination and then transitioned over to anoscopy to get a sense of the anatomy.  I noted a few hypopigmented areas on the left anal canal from about 1:00 to 5:00 in the lithotomy position.  I would ablated those with needle-tip point cautery.  He had some mild hemorrhoidal enlargement but not severe.  I went and excised the 1.5 cm elevated mass a few centimeters anterior to the anus in the midline raphae.  I used cautery to  the subcutaneous tissues.  I closed that using interrupted 2-0 Vicryl stitches.  I did attention to the sacrum.  We switched gloves & instruments.  He had a 2 x 3 cm blistering with some pits near the  coccyx c/w pilonidal disease.  I made an elliptical excision vertically to the most cephalad.  In the left any her gluteal cleft.  Surgical clips are in the worked teardrop.  Excised down to the level of the sacral fascia to healthy tissue.  I released off the gluteal muscles very medially to help release the tension.  Placed drains in the layers as noted above.  They released the tape off the glutei.  I closed the wound in layers using 2-0 Vicryl stitches in the deep layer.  I used a 2-0 Vicryl deep dermal stitches for the more superficial layer.  I used a 4 running Monocryl subcuticular stitch as well.  Placed Steri-Strips.   Per his wishes we'll have the instructions sealed in an envelope for him specifically  . Patient is being extubated go to recovery room.  I am about to discuss the patient's status to the family.

## 2012-07-24 NOTE — Transfer of Care (Signed)
Immediate Anesthesia Transfer of Care Note  Patient: Benjamin Herrera  Procedure(s) Performed: Procedure(s) (LRB) with comments: CYST EXCISION PILONIDAL SIMPLE (N/A) - Exam Under Anesthesia,, Excision Pilonidal Disease,  EXAM UNDER ANESTHESIA (N/A) FULGURATION ANAL WART (N/A) - excision of raphe mass  Patient Location: PACU  Anesthesia Type: General  Level of Consciousness: oriented, sedated and patient cooperative  Airway & Oxygen Therapy: Patient Spontanous Breathing and Patient connected to face mask oxygen  Post-op Assessment: Report given to PACU RN, Post -op Vital signs reviewed and stable and Patient moving all extremities  Post vital signs: Reviewed and stable  Complications: No apparent anesthesia complications

## 2012-07-24 NOTE — H&P (View-Only) (Signed)
Subjective:     Patient ID: Benjamin Herrera, male   DOB: 1966/08/01, 46 y.o.   MRN: [id number]  HPI  Benjamin Herrera  05-02-1966 [id number]  Patient Care Team: [name], MD as PCP - Infectious Diseases (Infectious Diseases) [name], MD as Consulting Physician (Gastroenterology) [name], MD as Consulting Physician (General Surgery)  This patient is a 46 y.o.male who presents today for surgical evaluation at the request of Dr. Ninetta Lights.   Reason for evaluation: Mass at base of scrotum.  Perianal drainage.  Patient is an HIV-positive man on chronic anti-retroviral therapy followed by Dr. Ninetta Lights.  He is also followed by Dr. Arlyce Dice for some dysphagia and esophageal dysmotility consistent with probable achalasia.  He is Being sent to [facility] to see Dr. Loney Laurence for further workup for this.  Controlled with Botox injections at this time.  Patient is noted a lump at the base of his scrotum more towards the anus a few months ago.  Is has gradually gotten larger.  It is uncomfortable.  He also notes he gets drainage and occasional blood when he wipes.  Discomfort more in the tailbone side.  Normally has a bowel movement once or twice a day.  Occasionally loose.  Has had normal colonoscopies in the past.  History of unprotected anal receptive sex in the past.  Does not recall any history of herpes, condyloma, or other sexually transmitted diseases.  No history of anorectal problems.  No fissure or hemorrhoid issues.  No major rectal bleeding.  Some irritation but no burning.  No severe pain with bowel movements.  Because of the mass, he was sent to me for evaluation.  Patient Active Problem List  Diagnosis  . HIV DISEASE  . CANDIDIASIS OF MOUTH  . OTHER CANDIDIASIS OF OTHER SPECIFIED SITES  . SUBSTANCE ABUSE, MULTIPLE  . HYPERTENSION  . ASTHMA, UNSPECIFIED, UNSPECIFIED STATUS  . Achalasia and cardiospasm  . PSORIASIS  . ABDOMINAL PAIN, EPIGASTRIC  . FRACTURE,  HUMERUS  . Abdominal pain, left lower quadrant  . Pilonidal disease  . 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 HIV (human immunodeficiency virus infection)   . Polysubstance abuse   . Boils     under arms  . Hemorrhoids   . Herpes labialis   . Psoriasis   . Achalasia and cardiospasm   . Candidiasis of unspecified site   . Dysphagia   . Asthma   . Candidiasis of mouth   . Fracture, humerus   . Hepatitis     a    Past Surgical History  Procedure Date  . Humeral fracture surgery   . Right shoulder replacement 2008    x 2  . Esophagogastroduodenoscopy 11/11/2011    Procedure: ESOPHAGOGASTRODUODENOSCOPY (EGD);  Surgeon: [name], MD;  Location: [facility];  Service: Endoscopy;  Laterality: N/A;  botox injection  called Pt to change time of procedure per Dr Arlyce Dice  . Esophagogastroduodenoscopy 03/10/2012    Procedure: ESOPHAGOGASTRODUODENOSCOPY (EGD);  Surgeon: [name], MD;  Location: [facility];  Service: Endoscopy;  Laterality: N/A;  . Colonoscopy 05/19/2012    Procedure: COLONOSCOPY;  Surgeon: [name], MD;  Location: [facility];  Service: Endoscopy;  Laterality: N/A;  jill trying to contact pt to come in 0830 for 930 case, phone not accepting messages  . Esophageal manometry 05/29/2012    Procedure: ESOPHAGEAL MANOMETRY (EM);  Surgeon: [name], MD;  Location: [facility];  Service: Endoscopy;  Laterality: N/A;    History   Social History  . Marital Status: Single    Spouse Name: N/A    Number of Children: N/A  . Years of Education: N/A   Occupational History  . Not on file.   Social History Main Topics  . Smoking status: Current Everyday Smoker -- 0.5 packs/day    Types: Cigarettes  . Smokeless tobacco: Never Used  . Alcohol Use: No  . Drug Use: 7 per week    Special: Marijuana, Cocaine     pt now denies cocaine use   . Sexually Active: Yes -- Male partner(s)     pt.  refused condoms   Other Topics Concern  . Not on file   Social History Narrative  . No narrative on file    Family History  Problem Relation Age of Onset  . Colon cancer Neg Hx   . Stomach cancer Maternal Grandmother     ?  . Diabetes Maternal Grandmother     Current Outpatient Prescriptions  Medication Sig Dispense Refill  . albuterol (PROVENTIL HFA;VENTOLIN HFA) 108 (90 BASE) MCG/ACT inhaler Inhale 2 puffs into the lungs every 6 (six) hours as needed. For wheezing or shortness of breath  18 g  3  . Darunavir Ethanolate (PREZISTA) 800 MG tablet Take 1 tablet (800 mg total) by mouth daily.  30 tablet  6  . emtricitabine-tenofovir (TRUVADA) 200-300 MG per tablet Take 1 tablet by mouth daily with breakfast.  30 tablet  6  . raltegravir (ISENTRESS) 400 MG tablet Take 1 tablet (400 mg total) by mouth 2 (two) times daily.  60 tablet  6  . ritonavir (NORVIR) 100 MG TABS Take 1 tablet (100 mg total) by mouth daily.  30 tablet  6     No Known Allergies  BP 124/78  Pulse 80  Resp 18  Ht 6' 1.5" (1.867 m)  Wt 224 lb (101.606 kg)  BMI 29.15 kg/m2  No results found.   Review of Systems  Constitutional: Negative for fever, chills and diaphoresis.  HENT: Negative for nosebleeds, sore throat, facial swelling, mouth sores, trouble swallowing and ear discharge.   Eyes: Negative for photophobia, discharge and visual disturbance.  Respiratory: Negative for choking, chest tightness, shortness of breath and stridor.   Cardiovascular: Negative for chest pain and palpitations.  Gastrointestinal: Positive for anal bleeding. Negative for nausea, vomiting, abdominal pain, diarrhea, constipation, blood in stool, abdominal distention and rectal pain.  Genitourinary: Negative for dysuria, urgency, difficulty urinating and testicular pain.  Musculoskeletal: Negative for myalgias, back pain, arthralgias and gait problem.  Skin: Negative for color change, pallor, rash and wound.  Neurological:  Negative for dizziness, speech difficulty, weakness, numbness and headaches.  Hematological: Negative for adenopathy. Does not bruise/bleed easily.  Psychiatric/Behavioral: Negative for hallucinations, confusion and agitation.       Objective:   Physical Exam  Constitutional: He is oriented to person, place, and time. He appears well-developed and well-nourished. No distress.  HENT:  Head: Normocephalic.  Mouth/Throat: Oropharynx is clear and moist. No oropharyngeal exudate.  Eyes: Conjunctivae and EOM are normal. Pupils are equal, round, and reactive to light. No scleral icterus.  Neck: Normal range of motion. Neck supple. No tracheal deviation present.  Cardiovascular: Normal rate, regular rhythm and intact distal pulses.   Pulmonary/Chest: Effort normal and breath sounds normal. No respiratory distress.  Abdominal: Soft. He exhibits no distension. There is no  tenderness. Hernia confirmed negative in the right inguinal area and confirmed negative in the left inguinal area.  Genitourinary: Penis normal. No penile tenderness.          Exam done with assistance of male Medical Assistant in the room.  Perianal skin clean with good hygiene.  No pruritis.  No fissure.  No abscess/fistula.    Tolerates digital and anoscopic rectal exam.  Normal sphincter tone.  Hemorrhoidal piles mildly enlarged   Musculoskeletal: Normal range of motion. He exhibits no tenderness.  Lymphadenopathy:    He has no cervical adenopathy.       Right: No inguinal adenopathy present.       Left: No inguinal adenopathy present.  Neurological: He is alert and oriented to person, place, and time. No cranial nerve deficit. He exhibits normal muscle tone. Coordination normal.  Skin: Skin is warm and dry. No rash noted. He is not diaphoretic. No erythema. No pallor.  Psychiatric: He has a normal mood and affect. His behavior is normal. Judgment and thought content normal.       Assessment:     1.  Scrotal &  anal canal masses suspicious for condylomata  2.  Pilonidal disease  3.  Esophageal dysmotility, probable atypical/incomplete achalasia    Plan:     I think he require surgery.  I would remove the scrotal mass.  Probably oblique the anal canal masses.  I also think he would benefit from excision of a pilonidal disease.  I discussed the procedures with him:  The anatomy & physiology of the anorectal region was discussed.  The pathophysiology of anorectal warts and differential diagnosis was discussed.  Natural history risks without surgery was discussed such as further growth and cancer.   I stressed the importance of office follow-up to catch early recurrence & minimize/halt progression of disease.  Interventions such as cauterization by topical agents were discussed.  The patient's symptoms are not adequately controlled by non-operative treatments.  I feel the risks & problems of no surgery outweigh the operative risks; therefore, I recommended surgery to treat the anal warts by removal, ablation and/or cauterization.  Risks such as bleeding, infection, need for further treatment, heart attack, death, and other risks were discussed.   I noted a good likelihood this will help address the problem. Goals of post-operative recovery were discussed as well.  Possibility that this will not correct all symptoms was explained.  Post-operative pain, bleeding, constipation, and other problems after surgery were discussed.  We will work to minimize complications.   Educational handouts further explaining the pathology, treatment options, and bowel regimen were given as well.  Questions were answered.  The patient expresses understanding & wishes to proceed with surgery.  The anatomy of the intragluteal cleft was discussed. Pathophysiology of pilonidal disease was discussed. The importance of keeping hairs trimmed to avoid recurrence was discussed. Discussion of options such as curretage, excision with closure  vs leaving open was discussed. Risks of infection and need for incision and drainage & antibiotics were discussed.  I noted a good likelihood this will help address the problem.    At this point, I think the patient would best served with considering surgery to excise the diseased tissue. I will make an attempt to close but it may need to be left open to allow it to heal with secondary intention and wound packing. Possible recurrences involve muscle flaps or different techniques were discussed as well. I noted that recurrence is higher with poor compliance  on care maintenance and hygiene. The patient's questions were answered. The patient agrees to proceed.  I called Dr. Arlyce Dice.  He is sending the patient for further evaluation of his esophageal issues to [facility] w Dr. Alycia Rossetti.  Should it come to surgery, I can do Heller myotomy &/partial Toupet fundoplication if needed.  I will table that tissue for now.

## 2012-07-24 NOTE — Anesthesia Preprocedure Evaluation (Signed)
Anesthesia Evaluation  Patient identified by MRN, date of birth, ID band Patient awake    Reviewed: Allergy & Precautions, H&P , NPO status , Patient's Chart, lab work & pertinent test results  Airway Mallampati: II TM Distance: >3 FB Neck ROM: Full    Dental  (+) Edentulous Upper, Partial Lower and Dental Advisory Given   Pulmonary neg pulmonary ROS, asthma ,  breath sounds clear to auscultation  Pulmonary exam normal       Cardiovascular hypertension, Pt. on medications negative cardio ROS  Rhythm:Regular Rate:Normal     Neuro/Psych negative neurological ROS  negative psych ROS   GI/Hepatic negative GI ROS, (+)     substance abuse   , Hepatitis -, A  Endo/Other  negative endocrine ROS  Renal/GU negative Renal ROS  negative genitourinary   Musculoskeletal negative musculoskeletal ROS (+)   Abdominal   Peds negative pediatric ROS (+)  Hematology  (+) HIV,   Anesthesia Other Findings   Reproductive/Obstetrics negative OB ROS                           Anesthesia Physical Anesthesia Plan  ASA: III  Anesthesia Plan: General   Post-op Pain Management:    Induction: Intravenous  Airway Management Planned: Oral ETT  Additional Equipment:   Intra-op Plan:   Post-operative Plan: Extubation in OR  Informed Consent: I have reviewed the patients History and Physical, chart, labs and discussed the procedure including the risks, benefits and alternatives for the proposed anesthesia with the patient or authorized representative who has indicated his/her understanding and acceptance.   Dental advisory given  Plan Discussed with: CRNA  Anesthesia Plan Comments:         Anesthesia Quick Evaluation

## 2012-07-24 NOTE — Progress Notes (Signed)
Dr Rica Mast notified that pt complaining of tongue soreness.  No respiratory distress.  Ordered to call Rica Mast to evaluate.  J Evans notified and in room.

## 2012-07-24 NOTE — Addendum Note (Signed)
Addendum  created 07/24/12 1635 by Stellarose Cerny E Rahkeem Senft, CRNA   Modules edited:Anesthesia Events    

## 2012-07-24 NOTE — Addendum Note (Signed)
Addendum  created 07/24/12 1635 by Illene Silver, CRNA   Modules edited:Anesthesia Events

## 2012-07-24 NOTE — Interval H&P Note (Signed)
History and Physical Interval Note:  07/24/2012 12:24 PM  Benjamin Herrera  has presented today for surgery, with the diagnosis of SCROTAL,ANAL MASS, PILONIDAL DISEASE  The various methods of treatment have been discussed with the patient and family. After consideration of risks, benefits and other options for treatment, the patient has consented to  Procedure(s) (LRB) with comments: CYST EXCISION PILONIDAL SIMPLE (N/A) - Exam Under Anesthesia,, Excision Pilonidal Disease,  EXAM UNDER ANESTHESIA (N/A) FULGURATION ANAL WART (N/A) - Removal /Ablation of Scrotal Anal Masses as a surgical intervention .  The patient's history has been reviewed, patient examined, no change in status, stable for surgery.  I have reviewed the patient's chart and labs.  Questions were answered to the patient's satisfaction.     Marina Boerner C.

## 2012-07-24 NOTE — Anesthesia Postprocedure Evaluation (Signed)
Anesthesia Post Note  Patient: Benjamin Herrera  Procedure(s) Performed: Procedure(s) (LRB): CYST EXCISION PILONIDAL SIMPLE (N/A) EXAM UNDER ANESTHESIA (N/A) FULGURATION ANAL WART (N/A)  Anesthesia type: General  Patient location: PACU  Post pain: Pain level controlled  Post assessment: Post-op Vital signs reviewed  Last Vitals:  Filed Vitals:   07/24/12 1434  BP: 125/88  Pulse: 80  Temp: 36.9 C  Resp: 16    Post vital signs: Reviewed  Level of consciousness: sedated  Complications: No apparent anesthesia complications

## 2012-07-26 ENCOUNTER — Telehealth (INDEPENDENT_AMBULATORY_CARE_PROVIDER_SITE_OTHER): Payer: Self-pay | Admitting: General Surgery

## 2012-07-26 DIAGNOSIS — G8918 Other acute postprocedural pain: Secondary | ICD-10-CM

## 2012-07-26 MED ORDER — HYDROCODONE-ACETAMINOPHEN 5-325 MG PO TABS: 1.0000 | ORAL_TABLET | Freq: Four times a day (QID) | ORAL | Status: DC | PRN

## 2012-07-26 NOTE — Telephone Encounter (Signed)
Hydrocodone #30 per protocol called to patient's pharmacy. Called patient to make him aware and he states that hydrocodone makes him sick. He states right now his RX does not have tylenol in it and he wants to percocet that has tylenol in it- not the IR without it. Please advise.

## 2012-07-26 NOTE — Telephone Encounter (Signed)
PT CALLED TO REQUEST A DIFFERENT PAIN MEDICATION OTHER THAN ROXICET/ TOO STRONG, MAKES HIM SICK/ HE SUGGESTED PERCOCET/ PH- [phone]. Benjamin Herrera

## 2012-07-26 NOTE — Telephone Encounter (Signed)
I CALLED PT TO NOTIFYOF ORDER BY DR. Michaell Cowing FOR HYDROCODONE 5/325 #30 AND PHENERGAN/ PT PREFERRED PERCOCET BUT WILL TRY HYDROCODONE/ CALLED TO [facility]/ [address][phone]/PT AWARE. HE WILL CALL TOMORROW WITH UPDATE ON RESULTS/GY

## 2012-07-26 NOTE — Telephone Encounter (Signed)
Percocet & roxicet the same! Try Hydrocodone per protocol

## 2012-07-26 NOTE — Addendum Note (Signed)
Addended byLiliana Cline on: 07/26/2012 02:38 PM   Modules accepted: Orders

## 2012-07-28 ENCOUNTER — Telehealth (INDEPENDENT_AMBULATORY_CARE_PROVIDER_SITE_OTHER): Payer: Self-pay | Admitting: General Surgery

## 2012-07-28 NOTE — Telephone Encounter (Signed)
I advised Mr Benjamin Herrera of Dr. Gordy Savers recommendation to take oxy-IR with Tylenol which would be the same as Percocet/ If he did not want to do that he could take the Vicodin/Phenergan. Pt indicated that he would like to be seen post-op earlier than 08-07-12. I made an appointment with Dr. Michaell Cowing for 08-03-12/gy

## 2012-08-03 ENCOUNTER — Encounter (INDEPENDENT_AMBULATORY_CARE_PROVIDER_SITE_OTHER): Payer: Self-pay | Admitting: Surgery

## 2012-08-03 ENCOUNTER — Ambulatory Visit (INDEPENDENT_AMBULATORY_CARE_PROVIDER_SITE_OTHER): Payer: Medicaid Other | Admitting: Surgery

## 2012-08-03 VITALS — BP 138/90 | HR 72 | Temp 98.6°F | Resp 18 | Ht 73.0 in | Wt 228.6 lb

## 2012-08-03 DIAGNOSIS — D0761 Carcinoma in situ of scrotum: Secondary | ICD-10-CM | POA: Insufficient documentation

## 2012-08-03 DIAGNOSIS — D0760 Carcinoma in situ of unspecified male genital organs: Secondary | ICD-10-CM

## 2012-08-03 DIAGNOSIS — L988 Other specified disorders of the skin and subcutaneous tissue: Secondary | ICD-10-CM

## 2012-08-03 DIAGNOSIS — A630 Anogenital (venereal) warts: Secondary | ICD-10-CM

## 2012-08-03 DIAGNOSIS — C44529 Squamous cell carcinoma of skin of other part of trunk: Secondary | ICD-10-CM

## 2012-08-03 HISTORY — DX: Squamous cell carcinoma of skin of other part of trunk: C44.529

## 2012-08-03 HISTORY — DX: Carcinoma in situ of scrotum: D07.61

## 2012-08-03 MED ORDER — OXYCODONE-ACETAMINOPHEN 10-325 MG PO TABS: 1.0000 | ORAL_TABLET | Freq: Four times a day (QID) | ORAL | Status: DC | PRN

## 2012-08-03 NOTE — Patient Instructions (Addendum)
ANORECTAL SURGERY: POST OP INSTRUCTIONS  1. Take your usually prescribed home medications unless otherwise directed. 2. DIET: Follow a light bland diet the first 24 hours after arrival home, such as soup, liquids, crackers, etc.  Be sure to include lots of fluids daily.  Avoid fast food or heavy meals as your are more likely to get nauseated.  Eat a low fat the next few days after surgery.   3. PAIN CONTROL: a. Pain is best controlled by a usual combination of three different methods TOGETHER: i. Ice/Heat ii. Over the counter pain medication iii. Prescription pain medication b. Most patients will experience some swelling and discomfort in the anus/rectal area. and incisions.  Ice packs or heat (30-60 minutes up to 6 times a day) will help. Use ice for the first few days to help decrease swelling and bruising, then switch to heat such as warm towels, sitz baths, warm baths, etc to help relax tight/sore spots and speed recovery.  Some people prefer to use ice alone, heat alone, alternating between ice & heat.  Experiment to what works for you.  Swelling and bruising can take several weeks to resolve.   c. It is helpful to take an over-the-counter pain medication regularly for the first few weeks.  Choose one of the following that works best for you: i. Naproxen (Aleve, etc)  Two 220mg tabs twice a day ii. Ibuprofen (Advil, etc) Three 200mg tabs four times a day (every meal & bedtime) iii. Acetaminophen (Tylenol, etc) 500-650mg four times a day (every meal & bedtime) d. A  prescription for pain medication (such as oxycodone, hydrocodone, etc) should be given to you upon discharge.  Take your pain medication as prescribed.  i. If you are having problems/concerns with the prescription medicine (does not control pain, nausea, vomiting, rash, itching, etc), please call us [phone] to see if we need to switch you to a different pain medicine that will work better for you and/or control your side effect  better. ii. If you need a refill on your pain medication, please contact your pharmacy.  They will contact our office to request authorization. Prescriptions will not be filled after 5 pm or on week-ends. 4. KEEP YOUR BOWELS REGULAR a. The goal is one bowel movement a day b. Avoid getting constipated.  Between the surgery and the pain medications, it is common to experience some constipation.  Increasing fluid intake and taking a fiber supplement (such as Metamucil, Citrucel, FiberCon, MiraLax, etc) 1-2 times a day regularly will usually help prevent this problem from occurring.  A mild laxative (prune juice, Milk of Magnesia, MiraLax, etc) should be taken according to package directions if there are no bowel movements after 48 hours. c. Watch out for diarrhea.  If you have many loose bowel movements, simplify your diet to bland foods & liquids for a few days.  Stop any stool softeners and decrease your fiber supplement.  Switching to mild anti-diarrheal medications (Kayopectate, Pepto Bismol) can help.  If this worsens or does not improve, please call us.  5. Wound Care a. Remove your bandages the day after surgery.  Unless discharge instructions indicate otherwise, leave your bandage dry and in place overnight.  Remove the bandage during your first bowel movement.   b. Allow the wound packing to fall out over the next few days.  You can trim exposed gauze / ribbon as it falls out.  You do not need to repack the wound unless instructed otherwise.  Wear an   absorbent pad or soft cotton gauze in your underwear as needed to catch any drainage and help keep the area  c. Keep the area clean and dry.  Bathe / shower every day.  Keep the area clean by showering / bathing over the incision / wound.   It is okay to soak an open wound to help wash it.  Wet wipes or showers / gentle washing after bowel movements is often less traumatic than regular toilet paper. d. You may have some styrofoam-like soft packing in  the rectum which will come out with the first bowel movement.  e. You will often notice bleeding with bowel movements.  This should slow down by the end of the first week of surgery f. Expect some drainage.  This should slow down, too, by the end of the first week of surgery.  Wear an absorbent pad or soft cotton gauze in your underwear until the drainage stops. 6. ACTIVITIES as tolerated:   a. You may resume regular (light) daily activities beginning the next day--such as daily self-care, walking, climbing stairs--gradually increasing activities as tolerated.  If you can walk 30 minutes without difficulty, it is safe to try more intense activity such as jogging, treadmill, bicycling, low-impact aerobics, swimming, etc. b. Save the most intensive and strenuous activity for last such as sit-ups, heavy lifting, contact sports, etc  Refrain from any heavy lifting or straining until you are off narcotics for pain control.   c. DO NOT PUSH THROUGH PAIN.  Let pain be your guide: If it hurts to do something, don't do it.  Pain is your body warning you to avoid that activity for another week until the pain goes down. d. You may drive when you are no longer taking prescription pain medication, you can comfortably sit for long periods of time, and you can safely maneuver your car and apply brakes. e. You may have sexual intercourse when it is comfortable.  7. FOLLOW UP in our office a. Please call [facility] at [phone] to set up an appointment to see your surgeon in the office for a follow-up appointment approximately 2 weeks after your surgery. b. Make sure that you call for this appointment the day you arrive home to insure a convenient appointment time. 10. IF YOU HAVE DISABILITY OR FAMILY LEAVE FORMS, BRING THEM TO THE OFFICE FOR PROCESSING.  DO NOT GIVE THEM TO YOUR DOCTOR.        WHEN TO CALL US [phone]: 1. Poor pain control 2. Reactions / problems with new medications (rash/itching, nausea,  etc)  3. Fever over 101.5 F (38.5 C) 4. Inability to urinate 5. Nausea and/or vomiting 6. Worsening swelling or bruising 7. Continued bleeding from incision. 8. Increased pain, redness, or drainage from the incision  The clinic staff is available to answer your questions during regular business hours (8:30am-5pm).  Please don't hesitate to call and ask to speak to one of our nurses for clinical concerns.   A surgeon from [facility] is always on call at the hospitals   If you have a medical emergency, go to the nearest emergency room or call 911.    [facility] [address], [address], [place], [place]  [address] ? MAIN: [phone] ? TOLL FREE: [phone] ? FAX [phone] [email or web address]    

## 2012-08-03 NOTE — Progress Notes (Signed)
Subjective:     Patient ID: Benjamin Herrera, male   DOB: 1966/09/02, 46 y.o.   MRN: [id number]  HPI  Benjamin Herrera  Aug 16, 1966 [id number]  Patient Care Team: [name], MD as PCP - General (Infectious Diseases) [name], MD as PCP - Infectious Diseases (Infectious Diseases) [name], MD as Consulting Physician (Gastroenterology) [name], MD as Consulting Physician (General Surgery)  This patient is a 46 y.o.male who presents today for surgical evaluation.   PROCEDURE: Procedure(s):  CYST EXCISION PILONIDAL SIMPLE  Excision of mass midline raphe  EXAM UNDER ANESTHESIA  FULGURATION ANAL WART   REPORT OF SURGICAL PATHOLOGY FINAL DIAGNOSIS Diagnosis 1. Skin , midline raphe - SQUAMOUS CELL CARCINOMA IN SITU. - BILATERAL MARGINS NOT INVOLVED. 2. Skin , presacral - MICROSCOPIC FOCUS OF INVASIVE SQUAMOUS CELL CARCINOMA ARISING IN A BACKGROUND OF EXTENSIVE SQUAMOUS CELL CARCINOMA IN SITU. - SQUAMOUS CELL CARCINOMA IN SITU EXTENDING TO THE PILONIDAL TRACT. - BILATERAL MARGINS, NEGATIVE FOR ATYPIA, DYSPLASIA OR MALIGNANCY. Microscopic Comment 2. Benjamin Herrera has reviewed this case and agrees with the above interpretation. Benjamin Miyamoto MD Pathologist, Electronic Signature (Case signed 07/26/2012)  The patient comes in today feeling rather sore.  Continues to smoke.  A lot of discomfort on his tailbone.  Did not feel oxycodone and Tylenol together were helpful yet still wants Percocet.  Tried Vicodin.  That helped a little bit.  Noted some drainage.  Feels some tubes at the skin as well.  Moving bowels daily.  Urinating fine.  Gradually recovering but feels rather wiped out  Patient Active Problem List  Diagnosis  . HIV DISEASE  . CANDIDIASIS OF MOUTH  . OTHER CANDIDIASIS OF OTHER SPECIFIED SITES  . SUBSTANCE ABUSE, MULTIPLE  . HYPERTENSION  . ASTHMA, UNSPECIFIED, UNSPECIFIED STATUS  . Achalasia and cardiospasm  . PSORIASIS  . ABDOMINAL PAIN,  EPIGASTRIC  . FRACTURE, HUMERUS  . Abdominal pain, left lower quadrant  . Pilonidal disease  . 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 HIV (human immunodeficiency virus infection)   . Polysubstance abuse   . Boils     under arms  . Hemorrhoids   . Herpes labialis   . Psoriasis   . Achalasia and cardiospasm   . Candidiasis of unspecified site   . Dysphagia   . Asthma   . Candidiasis of mouth   . Fracture, humerus   . Hepatitis 1993    A    Past Surgical History  Procedure Date  . Humeral fracture surgery   . Right shoulder replacement 2008    x 2  . Esophagogastroduodenoscopy 11/11/2011    Procedure: ESOPHAGOGASTRODUODENOSCOPY (EGD);  Surgeon: [name], MD;  Location: [facility];  Service: Endoscopy;  Laterality: N/A;  botox injection  called Pt to change time of procedure per Dr Arlyce Dice  . Esophagogastroduodenoscopy 03/10/2012    Procedure: ESOPHAGOGASTRODUODENOSCOPY (EGD);  Surgeon: [name], MD;  Location: [facility];  Service: Endoscopy;  Laterality: N/A;  . Colonoscopy 05/19/2012    Procedure: COLONOSCOPY;  Surgeon: [name], MD;  Location: [facility];  Service: Endoscopy;  Laterality: N/A;  jill trying to contact pt to come in 0830 for 930 case, phone not accepting messages  . Esophageal manometry 05/29/2012    Procedure: ESOPHAGEAL MANOMETRY (EM);  Surgeon: [name], MD;  Location: [facility];  Service: Endoscopy;  Laterality: N/A;  . Pilonidal  cyst excision 07/24/2012    Procedure: CYST EXCISION PILONIDAL SIMPLE;  Surgeon: [name], MD;  Location: [facility];  Service: General;  Laterality: N/A;  Exam Under Anesthesia,, Excision Pilonidal Disease,   . Examination under anesthesia 07/24/2012    Procedure: EXAM UNDER ANESTHESIA;  Surgeon: [name], MD;  Location: [facility];  Service: General;  Laterality: N/A;  . Wart fulguration 07/24/2012    Procedure: FULGURATION ANAL WART;   Surgeon: [name], MD;  Location: [facility];  Service: General;  Laterality: N/A;  excision of raphe mass    History   Social History  . Marital Status: Single    Spouse Name: N/A    Number of Children: N/A  . Years of Education: N/A   Occupational History  . Not on file.   Social History Main Topics  . Smoking status: Current Every Day Smoker -- 0.5 packs/day for 25 years    Types: Cigarettes  . Smokeless tobacco: Never Used  . Alcohol Use: No  . Drug Use: 7 per week    Special: Marijuana, Cocaine     pt now denies cocaine use   . Sexually Active: Yes -- Male partner(s)     pt. refused condoms   Other Topics Concern  . Not on file   Social History Narrative  . No narrative on file    Family History  Problem Relation Age of Onset  . Colon cancer Neg Hx   . Stomach cancer Maternal Grandmother     ?  . Diabetes Maternal Grandmother     Current Outpatient Prescriptions  Medication Sig Dispense Refill  . albuterol (PROVENTIL HFA;VENTOLIN HFA) 108 (90 BASE) MCG/ACT inhaler Inhale 2 puffs into the lungs every 6 (six) hours as needed. For wheezing or shortness of breath  18 g  3  . Darunavir Ethanolate (PREZISTA) 800 MG tablet Take 1 tablet (800 mg total) by mouth daily.  30 tablet  6  . emtricitabine-tenofovir (TRUVADA) 200-300 MG per tablet Take 1 tablet by mouth daily with breakfast.  30 tablet  6  . HYDROcodone-acetaminophen (NORCO) 5-325 MG per tablet Take 1 tablet by mouth every 6 (six) hours as needed for pain.  30 tablet  0  . oxyCODONE (OXY IR/ROXICODONE) 5 MG immediate release tablet Take 1-2 tablets (5-10 mg total) by mouth every 4 (four) hours as needed for pain.  50 tablet  0  . raltegravir (ISENTRESS) 400 MG tablet Take 1 tablet (400 mg total) by mouth 2 (two) times daily.  60 tablet  6  . ritonavir (NORVIR) 100 MG TABS Take 1 tablet (100 mg total) by mouth daily.  30 tablet  6     No Known Allergies  BP 138/90  Pulse 72  Temp 98.6 F (37 C) (Oral)   Resp 18  Ht 6\' 1"  (1.854 m)  Wt 228 lb 9.6 oz (103.692 kg)  BMI 30.16 kg/m2  No results found.   Review of Systems  Constitutional: Negative for fever, chills and diaphoresis.  HENT: Negative for sore throat, trouble swallowing and neck pain.   Eyes: Negative for photophobia and visual disturbance.  Respiratory: Negative for choking and shortness of breath.   Cardiovascular: Negative for chest pain and palpitations.  Gastrointestinal: Negative for nausea, vomiting, abdominal distention, anal bleeding and rectal pain.  Genitourinary: Negative for dysuria, urgency, difficulty urinating and testicular pain.  Musculoskeletal: Negative for myalgias, arthralgias and gait problem.  Skin: Negative for color change and rash.  Neurological: Negative  for dizziness, speech difficulty, weakness and numbness.  Hematological: Negative for adenopathy.  Psychiatric/Behavioral: Negative for hallucinations, confusion and agitation.       Objective:   Physical Exam  Constitutional: He is oriented to person, place, and time. He appears well-developed and well-nourished. No distress.  HENT:  Head: Normocephalic.  Mouth/Throat: Oropharynx is clear and moist. No oropharyngeal exudate.  Eyes: Conjunctivae normal and EOM are normal. Pupils are equal, round, and reactive to light. No scleral icterus.  Neck: Normal range of motion. No tracheal deviation present.  Cardiovascular: Normal rate, normal heart sounds and intact distal pulses.   Pulmonary/Chest: Effort normal. No respiratory distress.  Abdominal: Soft. He exhibits no distension. There is no tenderness. Hernia confirmed negative in the right inguinal area and confirmed negative in the left inguinal area.       No hernias  Genitourinary:     Musculoskeletal: Normal range of motion. He exhibits no tenderness.       Back:  Neurological: He is alert and oriented to person, place, and time. No cranial nerve deficit. He exhibits normal muscle tone.  Coordination normal.  Skin: Skin is warm and dry. No rash noted. He is not diaphoretic.  Psychiatric: He has a normal mood and affect. His behavior is normal.       Assessment:     Wound breakdown and HIV positive smoking male status post resection of pilonidal disease with squamous cell cancer within it.  Margins clear  Perineal/scrotal raphae mass removed.  Precancerous squamous cell carcinoma in situ. Margins clear    Plan:     I gave him and prescribed Percocet 10/325.  I recommend he try Aleve as well.  I strongly suggested he stop smoking.  I noted smoking will make infection or likely.  Allow wound breakdown to progress.  Gives more pain.  He will consider it.  Okay to bathe and shower.  I reinforced this again.  Return to clinic in 7-10 days to remove the deeper Penrose drain.  Wound packing to keep the raw areas clean and allow them to heal by secondary intention.  No definite cellulitis/abscess.  Therefore, no antibiotics  I noted he probably will need repeated followups in the next year or so to make sure he does not get any new skin cancers.  I would not call this anal cancer as this was on the skin around the sacrum and perineum & not near the anal canal itself.  Condyloma and anal canal ablated.  Probably needs followup with the as well.

## 2012-08-07 ENCOUNTER — Encounter (INDEPENDENT_AMBULATORY_CARE_PROVIDER_SITE_OTHER): Payer: Medicaid Other | Admitting: Surgery

## 2012-08-09 ENCOUNTER — Encounter (INDEPENDENT_AMBULATORY_CARE_PROVIDER_SITE_OTHER): Payer: Self-pay | Admitting: Surgery

## 2012-08-09 ENCOUNTER — Ambulatory Visit (INDEPENDENT_AMBULATORY_CARE_PROVIDER_SITE_OTHER): Payer: Medicaid Other | Admitting: Surgery

## 2012-08-09 VITALS — BP 136/80 | HR 80 | Temp 98.3°F | Resp 16 | Ht 73.5 in | Wt 223.0 lb

## 2012-08-09 DIAGNOSIS — C44529 Squamous cell carcinoma of skin of other part of trunk: Secondary | ICD-10-CM

## 2012-08-09 DIAGNOSIS — A630 Anogenital (venereal) warts: Secondary | ICD-10-CM

## 2012-08-09 DIAGNOSIS — D0760 Carcinoma in situ of unspecified male genital organs: Secondary | ICD-10-CM

## 2012-08-09 DIAGNOSIS — D0761 Carcinoma in situ of scrotum: Secondary | ICD-10-CM

## 2012-08-09 MED ORDER — SULFAMETHOXAZOLE-TRIMETHOPRIM 800-160 MG PO TABS: 2.0000 | ORAL_TABLET | Freq: Two times a day (BID) | ORAL | Status: DC

## 2012-08-09 MED ORDER — OXYCODONE-ACETAMINOPHEN 10-325 MG PO TABS: 1.0000 | ORAL_TABLET | Freq: Three times a day (TID) | ORAL | Status: DC | PRN

## 2012-08-09 NOTE — Patient Instructions (Addendum)
ANORECTAL SURGERY: POST OP INSTRUCTIONS  1. Take your usually prescribed home medications unless otherwise directed. 2. DIET: Follow a light bland diet the first 24 hours after arrival home, such as soup, liquids, crackers, etc.  Be sure to include lots of fluids daily.  Avoid fast food or heavy meals as your are more likely to get nauseated.  Eat a low fat the next few days after surgery.   3. PAIN CONTROL: a. Pain is best controlled by a usual combination of three different methods TOGETHER: i. Ice/Heat ii. Over the counter pain medication iii. Prescription pain medication b. Most patients will experience some swelling and discomfort in the anus/rectal area. and incisions.  Ice packs or heat (30-60 minutes up to 6 times a day) will help. Use ice for the first few days to help decrease swelling and bruising, then switch to heat such as warm towels, sitz baths, warm baths, etc to help relax tight/sore spots and speed recovery.  Some people prefer to use ice alone, heat alone, alternating between ice & heat.  Experiment to what works for you.  Swelling and bruising can take several weeks to resolve.   c. It is helpful to take an over-the-counter pain medication regularly for the first few weeks.  Choose one of the following that works best for you: i. Naproxen (Aleve, etc)  Two 220mg tabs twice a day ii. Ibuprofen (Advil, etc) Three 200mg tabs four times a day (every meal & bedtime) iii. Acetaminophen (Tylenol, etc) 500-650mg four times a day (every meal & bedtime) d. A  prescription for pain medication (such as oxycodone, hydrocodone, etc) should be given to you upon discharge.  Take your pain medication as prescribed.  i. If you are having problems/concerns with the prescription medicine (does not control pain, nausea, vomiting, rash, itching, etc), please call us [phone] to see if we need to switch you to a different pain medicine that will work better for you and/or control your side effect  better. ii. If you need a refill on your pain medication, please contact your pharmacy.  They will contact our office to request authorization. Prescriptions will not be filled after 5 pm or on week-ends. 4. KEEP YOUR BOWELS REGULAR a. The goal is one bowel movement a day b. Avoid getting constipated.  Between the surgery and the pain medications, it is common to experience some constipation.  Increasing fluid intake and taking a fiber supplement (such as Metamucil, Citrucel, FiberCon, MiraLax, etc) 1-2 times a day regularly will usually help prevent this problem from occurring.  A mild laxative (prune juice, Milk of Magnesia, MiraLax, etc) should be taken according to package directions if there are no bowel movements after 48 hours. c. Watch out for diarrhea.  If you have many loose bowel movements, simplify your diet to bland foods & liquids for a few days.  Stop any stool softeners and decrease your fiber supplement.  Switching to mild anti-diarrheal medications (Kayopectate, Pepto Bismol) can help.  If this worsens or does not improve, please call us.  5. Wound Care a. Remove your bandages the day after surgery.  Unless discharge instructions indicate otherwise, leave your bandage dry and in place overnight.  Remove the bandage during your first bowel movement.   b. Allow the wound packing to fall out over the next few days.  You can trim exposed gauze / ribbon as it falls out.  You do not need to repack the wound unless instructed otherwise.  Wear an   absorbent pad or soft cotton gauze in your underwear as needed to catch any drainage and help keep the area  c. Keep the area clean and dry.  Bathe / shower every day.  Keep the area clean by showering / bathing over the incision / wound.   It is okay to soak an open wound to help wash it.  Wet wipes or showers / gentle washing after bowel movements is often less traumatic than regular toilet paper. d. Bonita Quin may have some styrofoam-like soft packing in  the rectum which will come out with the first bowel movement.  e. You will often notice bleeding with bowel movements.  This should slow down by the end of the first week of surgery f. Expect some drainage.  This should slow down, too, by the end of the first week of surgery.  Wear an absorbent pad or soft cotton gauze in your underwear until the drainage stops. 6. ACTIVITIES as tolerated:   a. You may resume regular (light) daily activities beginning the next day-such as daily self-care, walking, climbing stairs-gradually increasing activities as tolerated.  If you can walk 30 minutes without difficulty, it is safe to try more intense activity such as jogging, treadmill, bicycling, low-impact aerobics, swimming, etc. b. Save the most intensive and strenuous activity for last such as sit-ups, heavy lifting, contact sports, etc  Refrain from any heavy lifting or straining until you are off narcotics for pain control.   c. DO NOT PUSH THROUGH PAIN.  Let pain be your guide: If it hurts to do something, don't do it.  Pain is your body warning you to avoid that activity for another week until the pain goes down. d. You may drive when you are no longer taking prescription pain medication, you can comfortably sit for long periods of time, and you can safely maneuver your car and apply brakes. e. Bonita Quin may have sexual intercourse when it is comfortable.  7. FOLLOW UP in our office a. Please call [facility] at [phone] to set up an appointment to see your surgeon in the office for a follow-up appointment approximately 2 weeks after your surgery. b. Make sure that you call for this appointment the day you arrive home to insure a convenient appointment time. 10. IF YOU HAVE DISABILITY OR FAMILY LEAVE FORMS, BRING THEM TO THE OFFICE FOR PROCESSING.  DO NOT GIVE THEM TO YOUR DOCTOR.        WHEN TO CALL us [phone]: 1. Poor pain control 2. Reactions / problems with new medications (rash/itching, nausea,  etc)  3. Fever over 101.5 F (38.5 C) 4. Inability to urinate 5. Nausea and/or vomiting 6. Worsening swelling or bruising 7. Continued bleeding from incision. 8. Increased pain, redness, or drainage from the incision  The clinic staff is available to answer your questions during regular business hours (8:30am-5pm).  Please don't hesitate to call and ask to speak to one of our nurses for clinical concerns.   A surgeon from [facility] is always on call at the hospitals   If you have a medical emergency, go to the nearest emergency room or call 911.    [facility] [address], [address], [place], [place]  [address] ? MAIN: [phone] ? TOLL FREE: [phone] ? FAX [phone] [email or web address]   Pilonidal Cyst Care After A pilonidal cyst occurs when hairs get trapped (ingrown) beneath the skin in the crease between the buttocks over your sacrum (the bone  under that crease). Pilonidal cysts are most common in young men with a lot of body hair. When the cyst breaks(ruptured) or leaks, fluid from the cyst may cause burning and itching. If the cyst becomes infected, it causes a painful swelling filled with pus (abscess). The pus and trapped hairs need to be removed (often by lancing) so that the infection can heal. The word pilonidal means hair nest. HOME CARE INSTRUCTIONS  Your wound was packed with gauze to keep the wound open. This allows the wound to heal from the inside outwards and continue draining. The changing of the dressing regularly also helps keep the wound clean.  Return as directed for a wound check.  If you take tub baths or showers, repack the wound with gauze as directed following. Sponge baths are a good alternative. Sitz baths can also be used. This may be done three to four times a day or as directed.  If an antibiotic was ordered to fight the infection, take as directed.  Only take over-the-counter or  prescription medicines for pain, discomfort, or fever as directed by your caregiver.  If you had surgery and the wound was closed you may care for it as directed. This generally includes keeping it dry and clean and dressing it as directed. SEEK MEDICAL CARE IF:   You have increased pain, swelling, redness, drainage, or bleeding from the area.  You have a fever.  You have muscles aches, dizziness, or a general ill feeling. Document Released: 11/11/2006 Document Revised: 01/03/2012 Document Reviewed: 01/26/2007 [facility] Patient Information 2013 [place], [place].

## 2012-08-09 NOTE — Progress Notes (Signed)
Subjective:     Patient ID: Benjamin Herrera, male   DOB: September 26, 1966, 46 y.o.   MRN: [id number]  Wound Check    ADLER CHARTRAND  11-08-1965 [id number]  Patient Care Team: [name], MD as PCP - Infectious Diseases (Infectious Diseases) [name], MD as Consulting Physician (Gastroenterology) [name], MD as Consulting Physician (General Surgery)  This patient is a 46 y.o.male who presents today for surgical evaluation.   PROCEDURE: 07/24/2012 CYST EXCISION PILONIDAL SIMPLE  Excision of mass midline raphe  EXAM UNDER ANESTHESIA  FULGURATION ANAL WART   REPORT OF SURGICAL PATHOLOGY FINAL DIAGNOSIS Diagnosis 1. Skin , midline raphe - SQUAMOUS CELL CARCINOMA IN SITU. - BILATERAL MARGINS NOT INVOLVED. 2. Skin , presacral - MICROSCOPIC FOCUS OF INVASIVE SQUAMOUS CELL CARCINOMA ARISING IN A BACKGROUND OF EXTENSIVE SQUAMOUS CELL CARCINOMA IN SITU. - SQUAMOUS CELL CARCINOMA IN SITU EXTENDING TO THE PILONIDAL TRACT. - BILATERAL MARGINS, NEGATIVE FOR ATYPIA, DYSPLASIA OR MALIGNANCY. Microscopic Comment 2. Dr. Luisa Hart has reviewed this case and agrees with the above interpretation. [name] Pathologist, Electronic Signature (Case signed 07/26/2012)  The patient comes in today 2 weeks still feeling rather sore.  Continues to smoke but less than 1/2 pack a day.  A lot of discomfort on his tailbone.  Percocet helps.  Noted some drainage from remaining penrose drain.  Moving bowels daily.  Urinating fine.  Gradually recovering but feels rather wiped out  Patient Active Problem List  Diagnosis  . HIV DISEASE  . CANDIDIASIS OF MOUTH  . OTHER CANDIDIASIS OF OTHER SPECIFIED SITES  . SUBSTANCE ABUSE, MULTIPLE  . HYPERTENSION  . ASTHMA, UNSPECIFIED, UNSPECIFIED STATUS  . Achalasia and cardiospasm  . PSORIASIS  . ABDOMINAL PAIN, EPIGASTRIC  . FRACTURE, HUMERUS  . Abdominal pain, left lower quadrant  . Pilonidal disease  . Condyloma acuminatum in male of  scrotum & anal canal  . 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 HIV (human immunodeficiency virus infection)   . Polysubstance abuse   . Boils     under arms  . Hemorrhoids   . Herpes labialis   . Psoriasis   . Achalasia and cardiospasm   . Candidiasis of unspecified site   . Dysphagia   . Asthma   . Candidiasis of mouth   . Fracture, humerus   . Hepatitis 1993    A  . 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 Humeral fracture surgery   . Right shoulder replacement 2008    x 2  . Esophagogastroduodenoscopy 11/11/2011    Procedure: ESOPHAGOGASTRODUODENOSCOPY (EGD);  Surgeon: [name], MD;  Location: [facility];  Service: Endoscopy;  Laterality: N/A;  botox injection  called Pt to change time of procedure per Dr Arlyce Dice  . Esophagogastroduodenoscopy 03/10/2012    Procedure: ESOPHAGOGASTRODUODENOSCOPY (EGD);  Surgeon: [name], MD;  Location: [facility];  Service: Endoscopy;  Laterality: N/A;  . Colonoscopy 05/19/2012    Procedure: COLONOSCOPY;  Surgeon: [name], MD;  Location: [facility];  Service: Endoscopy;  Laterality: N/A;  jill trying to contact pt to come in 0830 for 930 case, phone not accepting messages  . Esophageal manometry 05/29/2012    Procedure: ESOPHAGEAL MANOMETRY (EM);  Surgeon:  [name], MD;  Location: [facility];  Service: Endoscopy;  Laterality: N/A;  . Pilonidal cyst excision 07/24/2012    Procedure: CYST EXCISION PILONIDAL SIMPLE;  Surgeon: [name], MD;  Location: [facility];  Service: General;  Laterality: N/A;  Exam Under Anesthesia,, Excision Pilonidal Disease,   . Examination under anesthesia 07/24/2012    Procedure: EXAM UNDER ANESTHESIA;  Surgeon: [name], MD;  Location: [facility];   Service: General;  Laterality: N/A;  . Wart fulguration 07/24/2012    Procedure: FULGURATION ANAL WART;  Surgeon: [name], MD;  Location: [facility];  Service: General;  Laterality: N/A;  excision of raphe mass    History   Social History  . Marital Status: Single    Spouse Name: N/A    Number of Children: N/A  . Years of Education: N/A   Occupational History  . Not on file.   Social History Main Topics  . Smoking status: Current Every Day Smoker -- 0.5 packs/day for 25 years    Types: Cigarettes  . Smokeless tobacco: Never Used  . Alcohol Use: No  . Drug Use: 7 per week    Special: Marijuana, Cocaine     pt now denies cocaine use   . Sexually Active: Yes -- Male partner(s)     pt. refused condoms   Other Topics Concern  . Not on file   Social History Narrative  . No narrative on file    Family History  Problem Relation Age of Onset  . Colon cancer Neg Hx   . Stomach cancer Maternal Grandmother     ?  . Diabetes Maternal Grandmother     Current Outpatient Prescriptions  Medication Sig Dispense Refill  . albuterol (PROVENTIL HFA;VENTOLIN HFA) 108 (90 BASE) MCG/ACT inhaler Inhale 2 puffs into the lungs every 6 (six) hours as needed. For wheezing or shortness of breath  18 g  3  . Darunavir Ethanolate (PREZISTA) 800 MG tablet Take 1 tablet (800 mg total) by mouth daily.  30 tablet  6  . emtricitabine-tenofovir (TRUVADA) 200-300 MG per tablet Take 1 tablet by mouth daily with breakfast.  30 tablet  6  . oxyCODONE-acetaminophen (PERCOCET) 10-325 MG per tablet Take 1-2 tablets by mouth every 6 (six) hours as needed for pain.  40 tablet  0  . raltegravir (ISENTRESS) 400 MG tablet Take 1 tablet (400 mg total) by mouth 2 (two) times daily.  60 tablet  6  . ritonavir (NORVIR) 100 MG TABS Take 1 tablet (100 mg total) by mouth daily.  30 tablet  6     No Known Allergies  BP 136/80  Pulse 80  Temp 98.3 F (36.8 C) (Temporal)  Resp 16  Ht 6' 1.5" (1.867 m)  Wt 223 lb  (101.152 kg)  BMI 29.02 kg/m2  No results found.   Review of Systems  Constitutional: Negative for fever, chills and diaphoresis.  HENT: Negative for sore throat, trouble swallowing and neck pain.   Eyes: Negative for photophobia and visual disturbance.  Respiratory: Negative for choking and shortness of breath.   Cardiovascular: Negative for chest pain and palpitations.  Gastrointestinal: Negative for nausea, vomiting, abdominal distention, anal bleeding and rectal pain.  Genitourinary: Negative for dysuria, urgency, difficulty urinating and testicular pain.  Musculoskeletal: Negative for myalgias, arthralgias and gait problem.  Skin: Negative for color change and rash.  Neurological: Negative for dizziness, speech difficulty, weakness and numbness.  Hematological: Negative for adenopathy.  Psychiatric/Behavioral: Negative for  hallucinations, confusion and agitation.       Objective:   Physical Exam  Constitutional: He is oriented to person, place, and time. He appears well-developed and well-nourished. No distress.  HENT:  Head: Normocephalic.  Mouth/Throat: Oropharynx is clear and moist. No oropharyngeal exudate.  Eyes: Conjunctivae normal and EOM are normal. Pupils are equal, round, and reactive to light. No scleral icterus.  Neck: Normal range of motion. No tracheal deviation present.  Cardiovascular: Normal rate, normal heart sounds and intact distal pulses.   Pulmonary/Chest: Effort normal. No respiratory distress.  Abdominal: Soft. He exhibits no distension. There is no tenderness. Hernia confirmed negative in the right inguinal area and confirmed negative in the left inguinal area.       No hernias  Genitourinary:     Musculoskeletal: Normal range of motion. He exhibits no tenderness.       Back:  Neurological: He is alert and oriented to person, place, and time. No cranial nerve deficit. He exhibits normal muscle tone. Coordination normal.  Skin: Skin is warm and  dry. No rash noted. He is not diaphoretic.  Psychiatric: He has a normal mood and affect. His behavior is normal.       Assessment:     Wound breakdown and HIV positive smoking male status post resection of pilonidal disease with squamous cell cancer within it.  Margins clear.  ?cellulitis  Perineal/scrotal raphae mass removed.  Precancerous squamous cell carcinoma in situ. Margins clear    Plan:     I gave him 40 more Percocet 10/325.  I recommend he try Aleve as well.    Bactrim DS twice a day x7 days for possible cellulitis.  I strongly suggested he stop smoking.  I noted smoking will make infection or likely.  Allow wound breakdown to progress.  Gives more pain.  He will consider it.  Okay to bathe and shower.  I reinforced this again.  Return to clinic in 2 weeks  Wound packing to keep the raw areas clean and allow them to heal by secondary intention.  No definite cellulitis/abscess.  Therefore, no antibiotics  I noted he probably will need repeated followups in the next year or so to make sure he does not get any new skin cancers.  I would not call this anal cancer as this was on the skin around the sacrum and perineum & not near the anal canal itself. Condyloma and anal canal ablated.  Probably needs followup with the as well.

## 2012-08-16 ENCOUNTER — Other Ambulatory Visit: Payer: Medicaid Other

## 2012-08-21 ENCOUNTER — Other Ambulatory Visit: Payer: Self-pay | Admitting: *Deleted

## 2012-08-21 ENCOUNTER — Other Ambulatory Visit: Payer: Medicaid Other

## 2012-08-21 ENCOUNTER — Ambulatory Visit (INDEPENDENT_AMBULATORY_CARE_PROVIDER_SITE_OTHER): Payer: Medicaid Other | Admitting: Surgery

## 2012-08-21 ENCOUNTER — Encounter (INDEPENDENT_AMBULATORY_CARE_PROVIDER_SITE_OTHER): Payer: Self-pay | Admitting: Surgery

## 2012-08-21 VITALS — BP 126/70 | HR 72 | Temp 97.0°F | Resp 16 | Ht 73.5 in | Wt 227.0 lb

## 2012-08-21 DIAGNOSIS — A630 Anogenital (venereal) warts: Secondary | ICD-10-CM

## 2012-08-21 DIAGNOSIS — G8918 Other acute postprocedural pain: Secondary | ICD-10-CM

## 2012-08-21 DIAGNOSIS — B20 Human immunodeficiency virus [HIV] disease: Secondary | ICD-10-CM

## 2012-08-21 DIAGNOSIS — D0761 Carcinoma in situ of scrotum: Secondary | ICD-10-CM

## 2012-08-21 DIAGNOSIS — D0760 Carcinoma in situ of unspecified male genital organs: Secondary | ICD-10-CM

## 2012-08-21 DIAGNOSIS — C44529 Squamous cell carcinoma of skin of other part of trunk: Secondary | ICD-10-CM

## 2012-08-21 MED ORDER — OXYCODONE-ACETAMINOPHEN 10-325 MG PO TABS: 1.0000 | ORAL_TABLET | Freq: Three times a day (TID) | ORAL | Status: DC | PRN

## 2012-08-21 MED ORDER — HYDROCORTISONE ACE-PRAMOXINE 2.5-1 % RE CREA: TOPICAL_CREAM | Freq: Three times a day (TID) | RECTAL | Status: DC

## 2012-08-21 NOTE — Patient Instructions (Addendum)
WOUND CARE  It is important that the wound be kept open.   -Keeping the skin edges apart will allow the wound to gradually heal from the base upwards.   - If the skin edges of the wound close too early, a new fluid pocket can form and infection can occur. -This is the reason to pack deeper wounds with gauze or ribbon -This is why drained wounds cannot be sewed closed right away  A healthy wound should form a lining of bright red "beefy" granulating tissue that will help shrink the wound and help the edges grow new skin into it.   -A little mucus / yellow discharge is normal (the body's natural way to try and form a scab) and should be gently washed off with soap and water with daily dressing changes.  -Green or foul smelling drainage implies bacterial colonization and can slow wound healing - a short course of antibiotic ointment (3-5 days) can help it clear up.  Call the doctor if it does not improve or worsens  -Avoid use of antibiotic ointments for more than a week as they can slow wound healing over time.    -Sometimes other wound care products will be used to reduce need for dressing changes and/or help clean up dirty wounds -Sometimes the surgeon needs to debride the wound in the office to remove dead or infected tissue out of the wound so it can heal more quickly and safely.    Change the dressing at least once a day -Wash the wound with mild soap and water gently every day.  It is good to shower or bathe the wound to help it clean out. -Use clean 4x4 gauze for medium/large wounds or ribbon plain NU-gauze for smaller wounds (it does not need to be sterile, just clean) -Keep the raw wound moist with a little saline or KY (saline) gel on the gauze.  -A dry wound will take longer to heal.  -Keep the skin dry around the wound to prevent breakdown and irritation. -Pack the wound down to the base -The goal is to keep the skin apart, not overpack the wound -Use a Q-tip or blunt-tipped kabob  stick toothpick to push the gauze down to the base in narrow or deep wounds   -Cover with a clean gauze and tape -paper or Medipore tape tend to be gentle on the skin -rotate the orientation of the tape to avoid repeated stress/trauma on the skin -using an ACE or Coban wrap on wounds on arms or legs can be used instead.  Complete all antibiotics through the entire prescription to help the infection heal and prevent new places of infection   Returning the see the surgeon is helpful to follow the healing process and help the wound close as fast as possible.  Pilonidal Cyst Care After A pilonidal cyst occurs when hairs get trapped (ingrown) beneath the skin in the crease between the buttocks over your sacrum (the bone under that crease). Pilonidal cysts are most common in young men with a lot of body hair. When the cyst breaks(ruptured) or leaks, fluid from the cyst may cause burning and itching. If the cyst becomes infected, it causes a painful swelling filled with pus (abscess). The pus and trapped hairs need to be removed (often by lancing) so that the infection can heal. The word pilonidal means hair nest. HOME CARE INSTRUCTIONS If the pilonidal sinus was NOT DRAINING OR LANCED:  Keep the area clean and dry. Bathe or shower daily.  Wash the area well with a germ-killing soap. Hot tub baths may help prevent infection. Dry the area well with a towel.  Avoid tight clothing in order to keep area as moisture-free as possible.  Keep area between buttocks as free from hair as possible. A depilatory may be used.  Take antibiotics as directed.  Only take over-the-counter or prescription medicines for pain, discomfort, or fever as directed by your caregiver. If the cyst WAS INFECTED AND NEEDED TO BE DRAINED:  Your caregiver may have packed the wound with gauze to keep the wound open. This allows the wound to heal from the inside outward and continue to drain.  Return as directed for a wound  check.  If you take tub baths or showers, repack the wound with gauze as directed following. Sponge baths are a good alternative. Sitz baths may be used three to four times a day or as directed.  If an antibiotic was ordered to fight the infection, take as directed.  Only take over-the-counter or prescription medicines for pain, discomfort, or fever as directed by your caregiver.  If a drain was in place and removed, use sitz baths for 20 minutes 4 times per day. Clean the wound gently with mild unscented soap, pat dry, and then apply a dry dressing as directed. If you had surgery and IT WAS MARSUPIALIZED (LEFT OPEN):  Your wound was packed with gauze to keep the wound open. This allows the wound to heal from the inside outwards and continue draining. The changing of the dressing regularly also helps keep the wound clean.  Return as directed for a wound check.  If you take tub baths or showers, repack the wound with gauze as directed following. Sponge baths are a good alternative. Sitz baths can also be used. This may be done three to four times a day or as directed.  If an antibiotic was ordered to fight the infection, take as directed.  Only take over-the-counter or prescription medicines for pain, discomfort, or fever as directed by your caregiver.  If you had surgery and the wound was closed you may care for it as directed. This generally includes keeping it dry and clean and dressing it as directed. SEEK MEDICAL CARE IF:   You have increased pain, swelling, redness, drainage, or bleeding from the area.  You have a fever.  You have muscles aches, dizziness, or a general ill feeling. Document Released: 11/11/2006 Document Revised: 01/03/2012 Document Reviewed: 01/26/2007 [facility] Patient Information 2013 [place], [place].  Smoking Cessation, Tips for Success YOU CAN QUIT SMOKING If you are ready to quit smoking, congratulations! You have chosen to help yourself be healthier.  Cigarettes bring nicotine, tar, carbon monoxide, and other irritants into your body. Your lungs, heart, and blood vessels will be able to work better without these poisons. There are many different ways to quit smoking. Nicotine gum, nicotine patches, a nicotine inhaler, or nicotine nasal spray can help with physical craving. Hypnosis, support groups, and medicines help break the habit of smoking. Here are some tips to help you quit for good.  Throw away all cigarettes.  Clean and remove all ashtrays from your home, work, and car.  On a card, write down your reasons for quitting. Carry the card with you and read it when you get the urge to smoke.  Cleanse your body of nicotine. Drink enough water and fluids to keep your urine clear or pale yellow. Do this after quitting to flush the nicotine from your body.  Learn to predict your moods. Do not let a bad situation be your excuse to have a cigarette. Some situations in your life might tempt you into wanting a cigarette.  Never have "just one" cigarette. It leads to wanting another and another. Remind yourself of your decision to quit.  Change habits associated with smoking. If you smoked while driving or when feeling stressed, try other activities to replace smoking. Stand up when drinking your coffee. Brush your teeth after eating. Sit in a different chair when you read the paper. Avoid alcohol while trying to quit, and try to drink fewer caffeinated beverages. Alcohol and caffeine may urge you to smoke.  Avoid foods and drinks that can trigger a desire to smoke, such as sugary or spicy foods and alcohol.  Ask people who smoke not to smoke around you.  Have something planned to do right after eating or having a cup of coffee. Take a walk or exercise to perk you up. This will help to keep you from overeating.  Try a relaxation exercise to calm you down and decrease your stress. Remember, you may be tense and nervous for the first 2 weeks after  you quit, but this will pass.  Find new activities to keep your hands busy. Play with a pen, coin, or rubber band. Doodle or draw things on paper.  Brush your teeth right after eating. This will help cut down on the craving for the taste of tobacco after meals. You can try mouthwash, too.  Use oral substitutes, such as lemon drops, carrots, a cinnamon stick, or chewing gum, in place of cigarettes. Keep them handy so they are available when you have the urge to smoke.  When you have the urge to smoke, try deep breathing.  Designate your home as a nonsmoking area.  If you are a heavy smoker, ask your caregiver about a prescription for nicotine chewing gum. It can ease your withdrawal from nicotine.  Reward yourself. Set aside the cigarette money you save and buy yourself something nice.  Look for support from others. Join a support group or smoking cessation program. Ask someone at home or at work to help you with your plan to quit smoking.  Always ask yourself, "Do I need this cigarette or is this just a reflex?" Tell yourself, "Today, I choose not to smoke," or "I do not want to smoke." You are reminding yourself of your decision to quit, even if you do smoke a cigarette. HOW WILL I FEEL WHEN I QUIT SMOKING?  The benefits of not smoking start within days of quitting.  You may have symptoms of withdrawal because your body is used to nicotine (the addictive substance in cigarettes). You may crave cigarettes, be irritable, feel very hungry, cough often, get headaches, or have difficulty concentrating.  The withdrawal symptoms are only temporary. They are strongest when you first quit but will go away within 10 to 14 days.  When withdrawal symptoms occur, stay in control. Think about your reasons for quitting. Remind yourself that these are signs that your body is healing and getting used to being without cigarettes.  Remember that withdrawal symptoms are easier to treat than the major  diseases that smoking can cause.  Even after the withdrawal is over, expect periodic urges to smoke. However, these cravings are generally short-lived and will go away whether you smoke or not. Do not smoke!  If you relapse and smoke again, do not lose hope. Most smokers quit 3 times before  they are successful.  If you relapse, do not give up! Plan ahead and think about what you will do the next time you get the urge to smoke. LIFE AS A NONSMOKER: MAKE IT FOR A MONTH, MAKE IT FOR LIFE Day 1: Hang this page where you will see it every day. Day 2: Get rid of all ashtrays, matches, and lighters. Day 3: Drink water. Breathe deeply between sips. Day 4: Avoid places with smoke-filled air, such as bars, clubs, or the smoking section of restaurants. Day 5: Keep track of how much money you save by not smoking. Day 6: Avoid boredom. Keep a good book with you or go to the movies. Day 7: Reward yourself! One week without smoking! Day 8: Make a dental appointment to get your teeth cleaned. Day 9: Decide how you will turn down a cigarette before it is offered to you. Day 10: Review your reasons for quitting. Day 11: Distract yourself. Stay active to keep your mind off smoking and to relieve tension. Take a walk, exercise, read a book, do a crossword puzzle, or try a new hobby. Day 12: Exercise. Get off the bus before your stop or use stairs instead of escalators. Day 13: Call on friends for support and encouragement. Day 14: Reward yourself! Two weeks without smoking! Day 15: Practice deep breathing exercises. Day 16: Bet a friend that you can stay a nonsmoker. Day 17: Ask to sit in nonsmoking sections of restaurants. Day 18: Hang up "No Smoking" signs. Day 19: Think of yourself as a nonsmoker. Day 20: Each morning, tell yourself you will not smoke. Day 21: Reward yourself! Three weeks without smoking! Day 22: Think of smoking in negative ways. Remember how it stains your teeth, gives you bad breath,  and leaves you short of breath. Day 23: Eat a nutritious breakfast. Day 24:Do not relive your days as a smoker. Day 25: Hold a pencil in your hand when talking on the telephone. Day 26: Tell all your friends you do not smoke. Day 27: Think about how much better food tastes. Day 28: Remember, one cigarette is one too many. Day 29: Take up a hobby that will keep your hands busy. Day 30: Congratulations! One month without smoking! Give yourself a big reward. Your caregiver can direct you to community resources or hospitals for support, which may include:  Group support.  Education.  Hypnosis.  Subliminal therapy. Document Released: 07/09/2004 Document Revised: 01/03/2012 Document Reviewed: 07/28/2009 [facility] Patient Information 2013 [place], [place].

## 2012-08-21 NOTE — Progress Notes (Signed)
Subjective:     Patient ID: Benjamin Herrera, male   DOB: May 01, 1966, 46 y.o.   MRN: [id number]  Wound Check    Benjamin Herrera  1966/06/17 [id number]  Patient Care Team: [name], MD as PCP - General (Infectious Diseases) [name], MD as PCP - Infectious Diseases (Infectious Diseases) [name], MD as Consulting Physician (Gastroenterology) [name], MD as Consulting Physician (General Surgery)  This patient is a 46 y.o.male who presents today for surgical evaluation.   PROCEDURE: 07/24/2012 CYST EXCISION PILONIDAL SIMPLE  Excision of mass midline raphe  EXAM UNDER ANESTHESIA  FULGURATION ANAL WART   REPORT OF SURGICAL PATHOLOGY FINAL DIAGNOSIS Diagnosis 1. Skin , midline raphe - SQUAMOUS CELL CARCINOMA IN SITU. - BILATERAL MARGINS NOT INVOLVED. 2. Skin , presacral - MICROSCOPIC FOCUS OF INVASIVE SQUAMOUS CELL CARCINOMA ARISING IN A BACKGROUND OF EXTENSIVE SQUAMOUS CELL CARCINOMA IN SITU. - SQUAMOUS CELL CARCINOMA IN SITU EXTENDING TO THE PILONIDAL TRACT. - BILATERAL MARGINS, NEGATIVE FOR ATYPIA, DYSPLASIA OR MALIGNANCY. Microscopic Comment 2. Dr. Luisa Hart has reviewed this case and agrees with the above interpretation. [name] Pathologist, Electronic Signature (Case signed 07/26/2012)  The patient comes in today 4 weeks still feeling rather sore.  Frustrated he still has soreness.  Hoping for more Percocet.  Also notes some areas that are known.  That concerned him. Continues to smoke but less than 1/2 pack a day.  Drainage has gone down since all drains have been out.  Moving bowels daily.  Urinating fine.  Gradually recovering but annoyed that he is not back to normal yet.  Patient Active Problem List  Diagnosis  . HIV DISEASE  . CANDIDIASIS OF MOUTH  . OTHER CANDIDIASIS OF OTHER SPECIFIED SITES  . SUBSTANCE ABUSE, MULTIPLE  . HYPERTENSION  . ASTHMA, UNSPECIFIED, UNSPECIFIED STATUS  . Achalasia and cardiospasm  . PSORIASIS  .  ABDOMINAL PAIN, EPIGASTRIC  . FRACTURE, HUMERUS  . Abdominal pain, left lower quadrant  . Pilonidal disease  . Condyloma acuminatum in male of scrotum & anal canal  . 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 HIV (human immunodeficiency virus infection)   . Polysubstance abuse   . Boils     under arms  . Hemorrhoids   . Herpes labialis   . Psoriasis   . Achalasia and cardiospasm   . Candidiasis of unspecified site   . Dysphagia   . Asthma   . Candidiasis of mouth   . Fracture, humerus   . Hepatitis 1993    A  . 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 Humeral fracture surgery   . Right shoulder replacement 2008    x 2  . Esophagogastroduodenoscopy 11/11/2011    Procedure: ESOPHAGOGASTRODUODENOSCOPY (EGD);  Surgeon: [name], MD;  Location: [facility];  Service: Endoscopy;  Laterality: N/A;  botox injection  called Pt to change time of procedure per Dr Arlyce Dice  . Esophagogastroduodenoscopy 03/10/2012    Procedure: ESOPHAGOGASTRODUODENOSCOPY (EGD);  Surgeon: [name], MD;  Location: [facility];  Service: Endoscopy;  Laterality: N/A;  . Colonoscopy 05/19/2012    Procedure: COLONOSCOPY;  Surgeon: [name], MD;  Location: [facility];  Service: Endoscopy;  Laterality: N/A;  jill  trying to contact pt to come in 0830 for 930 case, phone not accepting messages  . Esophageal manometry 05/29/2012    Procedure: ESOPHAGEAL MANOMETRY (EM);  Surgeon: [name], MD;  Location: [facility];  Service: Endoscopy;  Laterality: N/A;  . Pilonidal cyst excision 07/24/2012    Procedure: CYST EXCISION PILONIDAL SIMPLE;  Surgeon: [name], MD;  Location: [facility];  Service: General;  Laterality: N/A;  Exam Under Anesthesia,, Excision Pilonidal  Disease,   . Examination under anesthesia 07/24/2012    Procedure: EXAM UNDER ANESTHESIA;  Surgeon: [name], MD;  Location: [facility];  Service: General;  Laterality: N/A;  . Wart fulguration 07/24/2012    Procedure: FULGURATION ANAL WART;  Surgeon: [name], MD;  Location: [facility];  Service: General;  Laterality: N/A;  excision of raphe mass    History   Social History  . Marital Status: Single    Spouse Name: N/A    Number of Children: N/A  . Years of Education: N/A   Occupational History  . Not on file.   Social History Main Topics  . Smoking status: Current Every Day Smoker -- 0.5 packs/day for 25 years    Types: Cigarettes  . Smokeless tobacco: Never Used  . Alcohol Use: No  . Drug Use: 7 per week    Special: Marijuana, Cocaine     pt now denies cocaine use   . Sexually Active: Yes -- Male partner(s)     pt. refused condoms   Other Topics Concern  . Not on file   Social History Narrative  . No narrative on file    Family History  Problem Relation Age of Onset  . Colon cancer Neg Hx   . Stomach cancer Maternal Grandmother     ?  . Diabetes Maternal Grandmother     Current Outpatient Prescriptions  Medication Sig Dispense Refill  . albuterol (PROVENTIL HFA;VENTOLIN HFA) 108 (90 BASE) MCG/ACT inhaler Inhale 2 puffs into the lungs every 6 (six) hours as needed. For wheezing or shortness of breath  18 g  3  . Darunavir Ethanolate (PREZISTA) 800 MG tablet Take 1 tablet (800 mg total) by mouth daily.  30 tablet  6  . emtricitabine-tenofovir (TRUVADA) 200-300 MG per tablet Take 1 tablet by mouth daily with breakfast.  30 tablet  6  . oxyCODONE-acetaminophen (PERCOCET) 10-325 MG per tablet Take 1 tablet by mouth every 8 (eight) hours as needed for pain.  40 tablet  0  . raltegravir (ISENTRESS) 400 MG tablet Take 1 tablet (400 mg total) by mouth 2 (two) times daily.  60 tablet  6  . ritonavir (NORVIR) 100 MG TABS Take 1 tablet (100 mg total) by mouth daily.  30  tablet  6  . sulfamethoxazole-trimethoprim (BACTRIM DS,SEPTRA DS) 800-160 MG per tablet Take 2 tablets by mouth 2 (two) times daily.  28 tablet  2  . hydrocortisone-pramoxine (ANALPRAM-HC) 2.5-1 % rectal cream Place rectally 3 (three) times daily.  30 g  1     No Known Allergies  BP 126/70  Pulse 72  Temp 97 F (36.1 C) (Temporal)  Resp 16  Ht 6' 1.5" (1.867 m)  Wt 227 lb (102.967 kg)  BMI 29.54 kg/m2  No results found.   Review of Systems  Constitutional: Negative for fever, chills and diaphoresis.  HENT: Negative for sore throat, trouble swallowing and neck pain.   Eyes: Negative for photophobia and visual disturbance.  Respiratory: Negative for choking and  shortness of breath.   Cardiovascular: Negative for chest pain and palpitations.  Gastrointestinal: Negative for nausea, vomiting, abdominal distention, anal bleeding and rectal pain.  Genitourinary: Negative for dysuria, urgency, difficulty urinating and testicular pain.  Musculoskeletal: Negative for myalgias, arthralgias and gait problem.  Skin: Negative for color change and rash.  Neurological: Negative for dizziness, speech difficulty, weakness and numbness.  Hematological: Negative for adenopathy.  Psychiatric/Behavioral: Negative for hallucinations, confusion and agitation.       Objective:   Physical Exam  Constitutional: He is oriented to person, place, and time. He appears well-developed and well-nourished. No distress.  HENT:  Head: Normocephalic.  Mouth/Throat: Oropharynx is clear and moist. No oropharyngeal exudate.  Eyes: Conjunctivae normal and EOM are normal. Pupils are equal, round, and reactive to light. No scleral icterus.  Neck: Normal range of motion. No tracheal deviation present.  Cardiovascular: Normal rate, normal heart sounds and intact distal pulses.   Pulmonary/Chest: Effort normal. No respiratory distress.  Abdominal: Soft. He exhibits no distension. There is no tenderness. Hernia  confirmed negative in the right inguinal area and confirmed negative in the left inguinal area.       No hernias  Genitourinary:     Musculoskeletal: Normal range of motion. He exhibits no tenderness.       Back:  Neurological: He is alert and oriented to person, place, and time. No cranial nerve deficit. He exhibits normal muscle tone. Coordination normal.  Skin: Skin is warm and dry. No rash noted. He is not diaphoretic.  Psychiatric: He has a normal mood and affect. His behavior is normal.       Assessment:     Wound breakdown and HIV positive smoking male status post resection of pilonidal disease with squamous cell cancer within it.  Margins clear.  ?cellulitis  Perineal/scrotal raphae mass removed.  Precancerous squamous cell carcinoma in situ. Margins clear    Plan:     I gave him 40 more Percocet 10/325.  I recommend he try Aleve as well.    I strongly suggested he stop smoking.  I noted smoking will make infection or likely.  Allow wound breakdown to progress.  Gives more pain.  He will consider it.  He tells me he is thinking of getting electronic cigarettes  Okay to bathe and shower.  I reinforced this again.  Return to clinic in 2 weeks  Wound packing to keep the raw areas clean and allow them to heal by secondary intention.  Really just needs a cotton ball only at this point since the winter more superficial.  Avoid getting the area too moist.  No definite cellulitis/abscess; therefore, no antibiotics.With his smoking in immunosuppression and in the setting of carcinoma in situ, it does not surprise me that everything is not yet healed only a month out.  Has made definite progress.  Tried to give him hope that it will continue to improve.  I noted he probably will need repeated followups in the next year or so to make sure he does not get any new skin cancers.  I would not call this anal cancer as this was on the skin around the sacrum and perineum & not near the anal  canal itself. Condyloma and anal canal ablated.

## 2012-08-22 LAB — CBC WITH DIFFERENTIAL/PLATELET
Eosinophils Absolute: 0.2 10*3/uL (ref 0.0–0.7)
Eosinophils Relative: 3 % (ref 0–5)
HCT: 40.1 % (ref 39.0–52.0)
Lymphocytes Relative: 54 % — ABNORMAL HIGH (ref 12–46)
Lymphs Abs: 3.1 10*3/uL (ref 0.7–4.0)
MCH: 25.9 pg — ABNORMAL LOW (ref 26.0–34.0)
MCV: 74.7 fL — ABNORMAL LOW (ref 78.0–100.0)
Monocytes Absolute: 0.5 10*3/uL (ref 0.1–1.0)
Monocytes Relative: 8 % (ref 3–12)
Platelets: 184 10*3/uL (ref 150–400)
RBC: 5.37 MIL/uL (ref 4.22–5.81)

## 2012-08-22 LAB — COMPLETE METABOLIC PANEL WITH GFR
BUN: 16 mg/dL (ref 6–23)
CO2: 29 mEq/L (ref 19–32)
Calcium: 9.3 mg/dL (ref 8.4–10.5)
Chloride: 105 mEq/L (ref 96–112)
Creat: 1.47 mg/dL — ABNORMAL HIGH (ref 0.50–1.35)
GFR, Est African American: 65 mL/min
GFR, Est Non African American: 56 mL/min — ABNORMAL LOW
Glucose, Bld: 67 mg/dL — ABNORMAL LOW (ref 70–99)
Total Bilirubin: 0.3 mg/dL (ref 0.3–1.2)

## 2012-08-23 LAB — T-HELPER CELL (CD4) - (RCID CLINIC ONLY)
CD4 % Helper T Cell: 10 % — ABNORMAL LOW (ref 33–55)
CD4 T Cell Abs: 340 uL — ABNORMAL LOW (ref 400–2700)

## 2012-08-25 ENCOUNTER — Telehealth (INDEPENDENT_AMBULATORY_CARE_PROVIDER_SITE_OTHER): Payer: Self-pay

## 2012-08-25 NOTE — Telephone Encounter (Signed)
Patient called regarding his Analpram prescription, patient states his insurance will not cover this medication and would like to know if we could call in a different medication.

## 2012-08-28 ENCOUNTER — Telehealth (INDEPENDENT_AMBULATORY_CARE_PROVIDER_SITE_OTHER): Payer: Self-pay

## 2012-08-28 MED ORDER — PRAMOXINE HCL 1 % RE FOAM: RECTAL | Status: DC | PRN

## 2012-08-28 NOTE — Telephone Encounter (Signed)
Patient notified of new medication Proctofoam called to [facility] ([address]

## 2012-08-28 NOTE — Telephone Encounter (Signed)
Proctofoam sent to pharmacy

## 2012-08-28 NOTE — Telephone Encounter (Signed)
Try proctofoam

## 2012-08-28 NOTE — Addendum Note (Signed)
Addended byLiliana Cline on: 08/28/2012 02:32 PM   Modules accepted: Orders

## 2012-08-30 ENCOUNTER — Ambulatory Visit (INDEPENDENT_AMBULATORY_CARE_PROVIDER_SITE_OTHER): Payer: Medicaid Other | Admitting: Infectious Diseases

## 2012-08-30 ENCOUNTER — Encounter: Payer: Self-pay | Admitting: Infectious Diseases

## 2012-08-30 ENCOUNTER — Telehealth (INDEPENDENT_AMBULATORY_CARE_PROVIDER_SITE_OTHER): Payer: Self-pay | Admitting: General Surgery

## 2012-08-30 VITALS — BP 121/78 | HR 103 | Temp 98.4°F | Wt 229.0 lb

## 2012-08-30 DIAGNOSIS — B20 Human immunodeficiency virus [HIV] disease: Secondary | ICD-10-CM

## 2012-08-30 DIAGNOSIS — C44529 Squamous cell carcinoma of skin of other part of trunk: Secondary | ICD-10-CM

## 2012-08-30 DIAGNOSIS — N19 Unspecified kidney failure: Secondary | ICD-10-CM | POA: Insufficient documentation

## 2012-08-30 DIAGNOSIS — S42309A Unspecified fracture of shaft of humerus, unspecified arm, initial encounter for closed fracture: Secondary | ICD-10-CM

## 2012-08-30 MED ORDER — RITONAVIR 100 MG PO CAPS: 100.0000 mg | ORAL_CAPSULE | Freq: Every day | ORAL | Status: DC

## 2012-08-30 NOTE — Assessment & Plan Note (Signed)
He is doing well on his new art. Will change RTV gel cap to tablets. There may be a cost differential to this. Will see him back in 3-4 months. He is offered flu shot (refuses). He is given condoms.

## 2012-08-30 NOTE — Telephone Encounter (Signed)
Pt's amended order for ProtoFoam was also not covered by Medicaid.  Called in the OK to use the suggested substitute generic Hydrocorticone and Lidocaine (to [facility]) and notified pt to pick up after 4:00 pm today.

## 2012-08-30 NOTE — Assessment & Plan Note (Signed)
He has pins/rod in his R shoulder. He has been seen at [facility] but would like now to be seen locally, will refer.

## 2012-08-30 NOTE — Assessment & Plan Note (Signed)
Appears to be healing well. My great appreciation to Dr Michaell Cowing. He did not ask for pain rx today.

## 2012-08-30 NOTE — Progress Notes (Signed)
  Subjective:    Patient ID: Benjamin Herrera, male    DOB: 07-13-1966, 46 y.o.   MRN: [id number]  HPI 46 yo M with HIV+, achalasia and HTN. . Has previously gotten botox injections into stomach to improve abd pain. Still has some problems swallowing- feels like food and liquids get stuck. Is going to have surgery and his stomach muscle cut.  Had colonoscopy 05-19-12 for f/u of his colitis He had detectable virus and a genotype on 05-29-12 showing M184V. Was taking TRV/ISN, had DRVr added at his visit 06-09-12. 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  Still having some numbess in his wound. Pain is better. States he was in bed for 3 weeks.  His PIr was started as liquid but he had difficulty with taste. He was switched to gel-cap but has had problems swallowing.  HIV 1 RNA Quant (copies/mL)  Date Value  08/21/2012 <20   05/29/2012 2049*  12/27/2011 318*     CD4 T Cell Abs (cmm)  Date Value  08/21/2012 340*  05/29/2012 270*  12/27/2011 250*    Review of Systems  Constitutional: Negative for fever, chills, appetite change and unexpected weight change.  Gastrointestinal: Negative for diarrhea and constipation.  Genitourinary: Negative for dysuria.       Objective:   Physical Exam  Constitutional: He appears well-developed and well-nourished.  HENT:  Mouth/Throat: No oropharyngeal exudate.  Eyes: EOM are normal. Pupils are equal, round, and reactive to light.  Neck: Neck supple.  Cardiovascular: Normal rate, regular rhythm and normal heart sounds.   Pulmonary/Chest: Effort normal and breath sounds normal.  Abdominal: Soft. Bowel sounds are normal. There is no tenderness. There is no rebound.  Musculoskeletal:       Arms: Lymphadenopathy:    He has no cervical adenopathy.  Skin:             Assessment & Plan:

## 2012-08-30 NOTE — Assessment & Plan Note (Signed)
His Cr increased over his last 2 visits. Will continue to watch, not typically associated with darunavir/ritonavir.

## 2012-09-06 NOTE — Telephone Encounter (Signed)
Called to follow-up on appt with Dr. Alycia Rossetti. Per his office pt cannot see Dr. Alycia Rossetti because pt has medicaid. Pt scheduled to see Dr. Ashley Royalty at [facility] 12/14/11@1pm .

## 2012-09-13 ENCOUNTER — Encounter (INDEPENDENT_AMBULATORY_CARE_PROVIDER_SITE_OTHER): Payer: Medicaid Other | Admitting: Surgery

## 2012-09-15 ENCOUNTER — Telehealth: Payer: Self-pay | Admitting: *Deleted

## 2012-09-15 NOTE — Telephone Encounter (Signed)
Pt was referred by Dr. Ninetta Lights at his last visit, 08/30/12, to orthopedic MD.  Pt wondering when he will be seen by orthopedic MD.   Pt also requesting medication for "thrush."  Phone call to pt.  Thrush medication not currently on pt's medication profile.  Will schedule for MD appt.  Will need f/u of Orthopedic referral at that time as well.

## 2012-09-19 ENCOUNTER — Telehealth: Payer: Self-pay | Admitting: *Deleted

## 2012-09-19 ENCOUNTER — Ambulatory Visit (INDEPENDENT_AMBULATORY_CARE_PROVIDER_SITE_OTHER): Payer: Medicaid Other | Admitting: Surgery

## 2012-09-19 ENCOUNTER — Ambulatory Visit: Payer: Medicaid Other | Admitting: Infectious Diseases

## 2012-09-19 ENCOUNTER — Encounter (INDEPENDENT_AMBULATORY_CARE_PROVIDER_SITE_OTHER): Payer: Self-pay | Admitting: Surgery

## 2012-09-19 VITALS — BP 136/80 | HR 70 | Temp 97.0°F | Resp 18 | Ht 73.5 in | Wt 232.0 lb

## 2012-09-19 DIAGNOSIS — A630 Anogenital (venereal) warts: Secondary | ICD-10-CM

## 2012-09-19 DIAGNOSIS — L988 Other specified disorders of the skin and subcutaneous tissue: Secondary | ICD-10-CM

## 2012-09-19 DIAGNOSIS — D0760 Carcinoma in situ of unspecified male genital organs: Secondary | ICD-10-CM

## 2012-09-19 DIAGNOSIS — D0761 Carcinoma in situ of scrotum: Secondary | ICD-10-CM

## 2012-09-19 DIAGNOSIS — C44529 Squamous cell carcinoma of skin of other part of trunk: Secondary | ICD-10-CM

## 2012-09-19 MED ORDER — PRAMOXINE HCL 1 % RE FOAM: RECTAL | Status: DC | PRN

## 2012-09-19 NOTE — Patient Instructions (Signed)
Managing Pain  Pain after surgery or related to activity is often due to strain/injury to muscle, tendon, nerves and/or incisions.  This pain is usually short-term and will improve in a few months.   Many people find it helpful to do the following things TOGETHER to help speed the process of healing and to get back to regular activity more quickly:  1. Avoid heavy physical activity a.  no lifting greater than 20 pounds b. Do not "push through" the pain.  Listen to your body and avoid positions and maneuvers than reproduce the pain c. Walking is okay as tolerated, but go slowly and stop when getting sore.  d. Remember: If it hurts to do it, then don't do it! 2. Take Anti-inflammatory medication  a. Take with food/snack around the clock for 1-2 weeks i. This helps the muscle and nerve tissues become less irritable and calm down faster b. Choose ONE of the following over-the-counter medications: i. Naproxen 220mg  tabs (ex. Aleve) 1-2 pills twice a day  ii. Ibuprofen 200mg  tabs (ex. Advil, Motrin) 3-4 pills with every meal and just before bedtime iii. Acetaminophen 500mg  tabs (Tylenol) 1-2 pills with every meal and just before bedtime 3. Use a Heating pad or Ice/Cold Pack a. 4-6 times a day b. May use warm bath/hottub  or showers 4. Try Gentle Massage and/or Stretching  a. at the area of pain many times a day b. stop if you feel pain - do not overdo it  Try these steps together to help you body heal faster and avoid making things get worse.  Doing just one of these things may not be enough.    If you are not getting better after two weeks or are noticing you are getting worse, contact our office for further advice; we may need to re-evaluate you & see what other things we can do to help.  Anal Intraepithelial Neoplasia (AIN) ANAL INTRAEPITHELIAL NEOPLASIA (AIN) Anal Intra-epithelial Neoplasia (AIN) is a pre-cancerous condition of the skin surrounding the anus. In the early stages of AIN  there are abnormal skin cells (epithelial cells) in the outer third of the skin (epithelium). This is called AIN1 (also called a low-grade anal squamous intra-epithelial lesion (LSIL). This can progress to AIN2, where the outer 2/3 of the skin contains abnormal cells, which in turn can result in AIN3 where there are abnormal cells involving the full thickness of skin (AIN2 and AIN3 are also called high-grade anal squamous intra-epithelial lesions (HSIL). The next step is for these abnormal cells to cross a barrier at the base of the skin called the basement membrane. Once this occurs, it is invasive cancer (carcinoma).  RISK FACTORS The risk factors for AIN are the same as for anal cancer, and include the immunosuppressed (eg HIV and transplant patients), and those with previous anal warts. PREVENTION The vaccine currently offered to women to reduce the risk of cervical cancer targets the Human Papilloma Virus (HPV) serotypes 16, 18, 6 and 11. Although not approved for this purpose, this vaccine may have a role for reducing the risk of AIN and anal cancer in high risk populations who do not yet have HPV infection. TREATMENT OF AIN Treatment is aimed at eradicating AIN and preventing anal cancer with minimal disturbance to anal function. There is currently no uniform treatment largely due to the uncertain natural history of AIN, the variable extent of disease, and the fact there is not a universally effective treatment modality.  TOPICAL AGENTS Imiquimod cream 5% (  Aldara ) can be applied to the area 3 times a day, and can be used for up to 16 weeks. It is an immunomodulatory agent and that attacks the virus responsible for warts, but also has anti-tumour effects [1]. There is some evidence that it not only slows the progression of AIN, but causes regression, with one study showing that 3/4 of men with AIN had their AIN completely cleared at the end of treatment[2]. It also has the added benefit in those  with warts of causing regression of these, and in some cases eradication of the HPV virus. The main disadvantage is relapse in a quarter of cases after cessation of therapy[1]. Side effects of imiquimod include erythema, "flu?like" illness and erosions, which are usually mild. It should not be applied prior to sexual intercourse. A systematic review showed a noncompliance rate of less than 5% because of side effects, mainly related to incompatibility with sexual life [3-4]. Topical 5-Fluorouracil 5% cream (Efudix) can be applied to the area 1-2 times a day, and the largest study showing a recurrence of high grade AIN in only 8% of patients when used for 9-12 weeks[5]. PHOTODYNAMIC THERAPY Photodynamic therapy (PDT) involves the application of a cream (topical sensitizer) such as 5-Flourouracil (Efudix) and subsequent exposure of the anal region to light and oxygen to generate oxygen intermediates that damage areas with AIN [6]. Although there is little evidence, there is a suggestion that early AIN responds to this treatment [7-8]. INFRARED PHOTOCOAGULATION Infrared photocoagulation is the same as photodynamic therapy except that it only uses light with a wavelength longer than visible light. It's use for AIN was first described by Gwenlyn Fudge and colleagues [9], who showed that up to 2/3 of AIN can be cured and is effective in preventing progression to cancer.  SURGERY The treatment of widespread AIN3 is controversial. Most advocate surgery, however even with surgery there is a one third risk of recurrence [10-11]. If surgery is performed, it consists of either local excision or extensive excision with split skin grafting. The high recurrence rate with these procedures is thought to be related to ongoing HPV, multi?focal lesions that are not all treated, and a generalised field change. [place] of the high recurrence rate and extensive surgery required for widespread AIN3, many advocate just close  surveillance with regular (3-6 monthly) biopsies, so that early invasive anal cancer can be picked up early and treated accordingly. RADIOTHERAPY Radiotherapy has no role for AIN 3. Its only role is for confirmed anal cancer. FOLLOW-UP Follow?up should involve anoscopic examination, with or without the aid of high?resolution anoscopy. AIN 1 should be reviewed every 6?12 months with discharge from follow up upon resolution of AIN. AIN 3, especially in HIV?positive patients should be followed more closely every 3?6 months with or without treatment. The follow?up of AIN 2 is less clear, as the natural history of this condition is so uncertain. A regime somewhere between that of AIN 1 and 3 is advised, with HIV?positive or immunosuppressed patients being followed more like patients with AIN 3.

## 2012-09-19 NOTE — Progress Notes (Signed)
Subjective:     Patient ID: Benjamin Herrera, male   DOB: 01/19/1966, 46 y.o.   MRN: [id number]  Wound Check    Benjamin Herrera  1966-02-25 [id number]  Patient Care Team: [name], MD as PCP - General (Infectious Diseases) [name], MD as PCP - Infectious Diseases (Infectious Diseases) [name], MD as Consulting Physician (Gastroenterology) [name], MD as Consulting Physician (General Surgery)  This patient is a 46 y.o.male who presents today for surgical evaluation.   PROCEDURE: 07/24/2012 CYST EXCISION PILONIDAL SIMPLE  Excision of mass midline raphe  EXAM UNDER ANESTHESIA  FULGURATION ANAL WART   REPORT OF SURGICAL PATHOLOGY FINAL DIAGNOSIS Diagnosis 1. Skin , midline raphe - SQUAMOUS CELL CARCINOMA IN SITU. - BILATERAL MARGINS NOT INVOLVED. 2. Skin , presacral - MICROSCOPIC FOCUS OF INVASIVE SQUAMOUS CELL CARCINOMA ARISING IN A BACKGROUND OF EXTENSIVE SQUAMOUS CELL CARCINOMA IN SITU. - SQUAMOUS CELL CARCINOMA IN SITU EXTENDING TO THE PILONIDAL TRACT. - BILATERAL MARGINS, NEGATIVE FOR ATYPIA, DYSPLASIA OR MALIGNANCY. Microscopic Comment 2. Dr. Luisa Hart has reviewed this case and agrees with the above interpretation. [name] Pathologist, Electronic Signature (Case signed 07/26/2012)  The patient comes in today 2 months from surgery still feeling sore - but less.  Proctofoam helped with burning & numbness.  Minimal blood on wipes.  Continues to smoke but less than 1/2 pack a day.   Moving bowels daily.  Urinating fine.  Gradually recovering & in better spirits.  Patient Active Problem List  Diagnosis  . HIV DISEASE  . CANDIDIASIS OF MOUTH  . OTHER CANDIDIASIS OF OTHER SPECIFIED SITES  . SUBSTANCE ABUSE, MULTIPLE  . HYPERTENSION  . ASTHMA, UNSPECIFIED, UNSPECIFIED STATUS  . Achalasia and cardiospasm  . PSORIASIS  . ABDOMINAL PAIN, EPIGASTRIC  . FRACTURE, HUMERUS  . Abdominal pain, left lower quadrant  . Pilonidal disease  .  Condyloma acuminatum in male of scrotum & anal canal  . 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  . Renal failure, unspecified    Past Medical History  Diagnosis Date  . HIV (human immunodeficiency virus infection)   . Polysubstance abuse   . Boils     under arms  . Hemorrhoids   . Herpes labialis   . Psoriasis   . Achalasia and cardiospasm   . Candidiasis of unspecified site   . Dysphagia   . Asthma   . Candidiasis of mouth   . Fracture, humerus   . Hepatitis 1993    A  . 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 Humeral fracture surgery   . Right shoulder replacement 2008    x 2  . Esophagogastroduodenoscopy 11/11/2011    Procedure: ESOPHAGOGASTRODUODENOSCOPY (EGD);  Surgeon: [name], MD;  Location: [facility];  Service: Endoscopy;  Laterality: N/A;  botox injection  called Pt to change time of procedure per Dr Arlyce Dice  . Esophagogastroduodenoscopy 03/10/2012    Procedure: ESOPHAGOGASTRODUODENOSCOPY (EGD);  Surgeon: [name], MD;  Location: [facility];  Service: Endoscopy;  Laterality: N/A;  . Colonoscopy 05/19/2012    Procedure: COLONOSCOPY;  Surgeon: [name], MD;  Location: [facility];  Service: Endoscopy;  Laterality: N/A;  jill trying to contact pt to come in 0830 for 930 case, phone not accepting messages  .  Esophageal manometry 05/29/2012    Procedure: ESOPHAGEAL MANOMETRY (EM);  Surgeon: [name], MD;  Location: [facility];  Service: Endoscopy;  Laterality: N/A;  . Pilonidal cyst excision 07/24/2012    Procedure: CYST EXCISION PILONIDAL SIMPLE;  Surgeon: [name], MD;  Location: [facility];  Service: General;  Laterality: N/A;  Exam Under Anesthesia,, Excision Pilonidal Disease,   . Examination under anesthesia 07/24/2012    Procedure: EXAM UNDER  ANESTHESIA;  Surgeon: [name], MD;  Location: [facility];  Service: General;  Laterality: N/A;  . Wart fulguration 07/24/2012    Procedure: FULGURATION ANAL WART;  Surgeon: [name], MD;  Location: [facility];  Service: General;  Laterality: N/A;  excision of raphe mass    History   Social History  . Marital Status: Single    Spouse Name: N/A    Number of Children: N/A  . Years of Education: N/A   Occupational History  . Not on file.   Social History Main Topics  . Smoking status: Current Every Day Smoker -- 0.5 packs/day for 25 years    Types: Cigarettes  . Smokeless tobacco: Never Used  . Alcohol Use: No  . Drug Use: 7 per week    Special: Marijuana, Cocaine     Comment: pt now denies cocaine use   . Sexually Active: Yes -- Male partner(s)     Comment: pt. refused condoms   Other Topics Concern  . Not on file   Social History Narrative  . No narrative on file    Family History  Problem Relation Age of Onset  . Colon cancer Neg Hx   . Stomach cancer Maternal Grandmother     ?  . Diabetes Maternal Grandmother     Current Outpatient Prescriptions  Medication Sig Dispense Refill  . albuterol (PROVENTIL HFA;VENTOLIN HFA) 108 (90 BASE) MCG/ACT inhaler Inhale 2 puffs into the lungs every 6 (six) hours as needed. For wheezing or shortness of breath  18 g  3  . Darunavir Ethanolate (PREZISTA) 800 MG tablet Take 1 tablet (800 mg total) by mouth daily.  30 tablet  6  . emtricitabine-tenofovir (TRUVADA) 200-300 MG per tablet Take 1 tablet by mouth daily with breakfast.  30 tablet  6  . hydrocortisone-pramoxine (ANALPRAM-HC) 2.5-1 % rectal cream Place rectally 3 (three) times daily.  30 g  1  . pramoxine (PROCTOFOAM) 1 % foam Place rectally every 2 (two) hours as needed for hemorrhoids.  15 g  2  . raltegravir (ISENTRESS) 400 MG tablet Take 1 tablet (400 mg total) by mouth 2 (two) times daily.  60 tablet  6  . ritonavir (NORVIR) 100 MG capsule Take 1 capsule (100 mg  total) by mouth daily.  90 capsule  3  . sulfamethoxazole-trimethoprim (BACTRIM DS,SEPTRA DS) 800-160 MG per tablet Take 2 tablets by mouth 2 (two) times daily.  28 tablet  2  . oxyCODONE-acetaminophen (PERCOCET) 10-325 MG per tablet Take 1 tablet by mouth every 8 (eight) hours as needed for pain.  40 tablet  0     No Known Allergies  BP 136/80  Pulse 70  Temp 97 F (36.1 C)  Resp 18  Ht 6' 1.5" (1.867 m)  Wt 232 lb (105.235 kg)  BMI 30.19 kg/m2  No results found.   Review of Systems  Constitutional: Negative for fever, chills and diaphoresis.  HENT: Negative for sore throat, trouble swallowing and neck pain.   Eyes: Negative for photophobia and visual disturbance.  Respiratory: Negative for choking and shortness of breath.   Cardiovascular: Negative for chest pain and palpitations.  Gastrointestinal: Negative for nausea, vomiting, abdominal distention, anal bleeding and rectal pain.  Genitourinary: Negative for dysuria, urgency, difficulty urinating and testicular pain.  Musculoskeletal: Negative for myalgias, arthralgias and gait problem.  Skin: Negative for color change and rash.  Neurological: Negative for dizziness, speech difficulty, weakness and numbness.  Hematological: Negative for adenopathy.  Psychiatric/Behavioral: Negative for hallucinations, confusion and agitation.       Objective:   Physical Exam  Constitutional: He is oriented to person, place, and time. He appears well-developed and well-nourished. No distress.  HENT:  Head: Normocephalic.  Mouth/Throat: Oropharynx is clear and moist. No oropharyngeal exudate.  Eyes: Conjunctivae normal and EOM are normal. Pupils are equal, round, and reactive to light. No scleral icterus.  Neck: Normal range of motion. No tracheal deviation present.  Cardiovascular: Normal rate, normal heart sounds and intact distal pulses.   Pulmonary/Chest: Effort normal. No respiratory distress.  Abdominal: Soft. He exhibits no  distension. There is no tenderness. Hernia confirmed negative in the right inguinal area and confirmed negative in the left inguinal area.       No hernias  Genitourinary:     Musculoskeletal: Normal range of motion. He exhibits no tenderness.       Back:  Neurological: He is alert and oriented to person, place, and time. No cranial nerve deficit. He exhibits normal muscle tone. Coordination normal.  Skin: Skin is warm and dry. No rash noted. He is not diaphoretic.  Psychiatric: He has a normal mood and affect. His behavior is normal.       Assessment:     Wound breakdown and HIV positive smoking male status post resection of pilonidal disease with squamous cell cancer within it.  Margins clear.  Wound closed  Perineal/scrotal raphae mass removed.  Precancerous squamous cell carcinoma in situ. Margins clear  Pain in chronic pain pt with smoking & PSAbuse    Plan:     Renewed proctofoam one more time to help w burning/itching/discomfort  I strongly suggested he stop smoking.  I noted smoking will make pain continueinfection or likely.  Allow wound breakdown to progress.  Gives more pain.  He will consider it.  He tells me he is thinking of getting electronic cigarettes  Okay to bathe and shower.  I reinforced this again.  I noted he probably will need repeated followups in the next year or so to make sure he does not get any new skin cancers.  RTC 4 months.  I would not call this anal cancer as this was on the skin around the sacrum and perineum & not near the anal canal itself. Condyloma of anal canal ablated.

## 2012-09-19 NOTE — Telephone Encounter (Signed)
Called patient to see if he still needs the appt as he missed it today. He advised he had a previous appt and forgot to cancel. He also advised that he had some diflucan on hand and took it so he feels better. He asked about his orthopedic referral. I advised him that we could not schedule the appt as we are not on his Medicaid card and I tried to call the [facility] to get them to help and they advised he would have to call them. Advised him that he can call and get it changed or he can call [facility] to see if they will refer him. I advised him to call the office after he decides and if he gets it changed let us know so we can schedule is appt asap.

## 2012-10-03 ENCOUNTER — Telehealth: Payer: Self-pay

## 2012-10-03 NOTE — Telephone Encounter (Signed)
Pt called and wants to schedule another botox injection. Do you need to see the pt in the office for an OV? Please advise.

## 2012-10-03 NOTE — Telephone Encounter (Signed)
He can be directly scheduled

## 2012-10-04 ENCOUNTER — Other Ambulatory Visit: Payer: Self-pay | Admitting: Gastroenterology

## 2012-10-04 DIAGNOSIS — K220 Achalasia of cardia: Secondary | ICD-10-CM

## 2012-10-04 NOTE — Telephone Encounter (Signed)
Pt scheduled for EGD with botox at [facility] 10/11/12 pt to arrive at 11:30am for a 12:30pm appt. Pt to be NPO after midnight. Pt aware of appt date and time and prep.

## 2012-10-11 ENCOUNTER — Encounter (HOSPITAL_COMMUNITY): Payer: Self-pay

## 2012-10-11 ENCOUNTER — Encounter (HOSPITAL_COMMUNITY)
Admission: RE | Disposition: A | Discharge status: Home / Self Care | Payer: Self-pay | Source: Ambulatory Visit | Attending: Gastroenterology

## 2012-10-11 ENCOUNTER — Ambulatory Visit (HOSPITAL_COMMUNITY)
Admission: RE | Admit: 2012-10-11 | Discharge: 2012-10-11 | Disposition: A | Discharge status: Home / Self Care | Payer: Medicaid Other | Source: Ambulatory Visit | Attending: Gastroenterology | Admitting: Gastroenterology

## 2012-10-11 DIAGNOSIS — K269 Duodenal ulcer, unspecified as acute or chronic, without hemorrhage or perforation: Secondary | ICD-10-CM | POA: Insufficient documentation

## 2012-10-11 DIAGNOSIS — B20 Human immunodeficiency virus [HIV] disease: Secondary | ICD-10-CM | POA: Insufficient documentation

## 2012-10-11 DIAGNOSIS — R109 Unspecified abdominal pain: Secondary | ICD-10-CM | POA: Insufficient documentation

## 2012-10-11 DIAGNOSIS — K220 Achalasia of cardia: Secondary | ICD-10-CM | POA: Insufficient documentation

## 2012-10-11 DIAGNOSIS — R1310 Dysphagia, unspecified: Secondary | ICD-10-CM | POA: Insufficient documentation

## 2012-10-11 DIAGNOSIS — R1032 Left lower quadrant pain: Secondary | ICD-10-CM | POA: Insufficient documentation

## 2012-10-11 HISTORY — PX: BOTOX INJECTION: SHX5754

## 2012-10-11 HISTORY — PX: ESOPHAGOGASTRODUODENOSCOPY: SHX5428

## 2012-10-11 SURGERY — EGD (ESOPHAGOGASTRODUODENOSCOPY)
Anesthesia: Moderate Sedation

## 2012-10-11 MED ORDER — SODIUM CHLORIDE 0.9 % IJ SOLN
INTRAMUSCULAR | Status: DC | PRN
  Administered 2012-10-11: 13:00:00 via SUBMUCOSAL

## 2012-10-11 MED ORDER — MIDAZOLAM HCL 10 MG/2ML IJ SOLN
INTRAMUSCULAR | Status: AC
  Filled 2012-10-11: qty 2

## 2012-10-11 MED ORDER — DIPHENHYDRAMINE HCL 50 MG/ML IJ SOLN
INTRAMUSCULAR | Status: AC
  Filled 2012-10-11: qty 1

## 2012-10-11 MED ORDER — MIDAZOLAM HCL 10 MG/2ML IJ SOLN
INTRAMUSCULAR | Status: DC | PRN
  Administered 2012-10-11 (×3): 2 mg via INTRAVENOUS

## 2012-10-11 MED ORDER — GLYCOPYRROLATE 0.2 MG/ML IJ SOLN
INTRAMUSCULAR | Status: DC | PRN
  Administered 2012-10-11: 0.2 mg via INTRAVENOUS

## 2012-10-11 MED ORDER — SODIUM CHLORIDE 0.9 % IJ SOLN
100.0000 [IU] | Freq: Once | INTRAMUSCULAR | Status: DC
  Filled 2012-10-11: qty 100

## 2012-10-11 MED ORDER — FENTANYL CITRATE 0.05 MG/ML IJ SOLN
INTRAMUSCULAR | Status: DC | PRN
  Administered 2012-10-11: 10 ug via INTRAVENOUS
  Administered 2012-10-11 (×2): 25 ug via INTRAVENOUS

## 2012-10-11 MED ORDER — FENTANYL CITRATE 0.05 MG/ML IJ SOLN
INTRAMUSCULAR | Status: AC
  Filled 2012-10-11: qty 2

## 2012-10-11 MED ORDER — GLYCOPYRROLATE 0.2 MG/ML IJ SOLN
INTRAMUSCULAR | Status: AC
  Filled 2012-10-11: qty 1

## 2012-10-11 MED ORDER — SODIUM CHLORIDE 0.9 % IV SOLN: INTRAVENOUS | Status: DC

## 2012-10-11 MED ORDER — BUTAMBEN-TETRACAINE-BENZOCAINE 2-2-14 % EX AERO
INHALATION_SPRAY | CUTANEOUS | Status: DC | PRN
  Administered 2012-10-11: 2 via TOPICAL

## 2012-10-11 NOTE — Op Note (Signed)
[facility] [address] [place], [address]   ENDOSCOPY PROCEDURE REPORT  PATIENT: Benjamin Herrera, Benjamin Herrera  MR#: [id number] BIRTHDATE: 1966/09/15 , 46  yrs. old GENDER: Male ENDOSCOPIST: [name], MD REFERRED BY: PROCEDURE DATE:  10/11/2012 PROCEDURE:  EGD w/ directed submucosal injection(s), any substance ASA CLASS:     Class II INDICATIONS:  Dysphagia. MEDICATIONS: These medications were titrated to patient response per physician's verbal order, Versed 6 mg IV, Propofol (Diprivan) 60 mcg IV, and Robinul 0.2 mg IV TOPICAL ANESTHETIC: Cetacaine Spray  DESCRIPTION OF PROCEDURE: After the risks benefits and alternatives of the procedure were thoroughly explained, informed consent was obtained.  The Pentax Gastroscope [id number] endoscope was introduced through the mouth and advanced to the third portion of the duodenum. Without limitations.  The instrument was slowly withdrawn as the mucosa was fully examined.      There was a superficial erosion at the apex of the duodenal bulb. There was a superficial erosion at the apex of the duodenal bulb. The remainder of the upper endoscopy exam was otherwise normal. Retroflexed views revealed no abnormalities.   The GE junction was located at 38 cm from the incisors. 25 units (1 cc) was injected submucosally into each quadrant at the GE junction Pulling 4 separate injections of 25 units each.  The scope was then withdrawn from the patient and the procedure completed.  COMPLICATIONS: There were no complications.  ENDOSCOPIC IMPRESSION: 1.  Duodenal erosion 2.  status post Botox injection of the LES  RECOMMENDATIONS: Repeat injection as needed REPEAT EXAM:  eSigned:  [name], MD 10/11/2012 12:52 PM   CC:

## 2012-10-11 NOTE — H&P (Signed)
    Benjamin Herrera   05/08/2012 9:45 AM Office Visit  MRN: [id number]   Description: 46 year old male  Provider: [name], MD  Department: [facility]        Diagnoses     Abdominal  pain, other specified site   - Primary    789.09    Achalasia and cardiospasm     530.0    Abdominal pain, left lower quadrant     789.04    Human immunodeficiency virus (HIV) disease     042      Reason for Visit     achalasia    f/u after procedure        Vitals - Last Recorded       BP Pulse Ht Wt BMI      114/70 84 6\' 1"  (1.854 m) 225 lb 6 oz (102.229 kg) 29.73 kg/m2         Vitals History Recorded       Progress Notes     Benjamin Heaps, MD  05/09/2012  2:14 PM  Signed History of Present Illness: Benjamin Herrera is having recurrent dysphagia to solids and liquids.          Review of Systems: Pertinent positive and negative review of systems were noted in the above HPI section. All other review of systems were otherwise negative.       Current Medications, Allergies, Past Medical History, Past Surgical History, Family History and Social History were reviewed in [employer] [facility] electronic medical record   Vital signs were reviewed in today's medical record. Physical Exam: General: Well developed , well nourished, no acute distress Abdomen is without masses, tenderness organomegaly            Achalasia and cardiospasm - Benjamin Heaps, MD  05/08/2012 10:19 AM  Signed Patient has x-rays suggestive of achalasia and esophageal manometry that demonstrated a high resting LES pressure with incomplete relaxation. Normal peristalsis was noted, however. The patient has had excellent responses to Botox injection. I again discussed more definitive therapy for achalasia including balloon dilation and myotomy. He is interested in pursuing the latter.   Recommendations #1 repeat EGD with botox injection   #2 to consider small bowel series pending results of  colonoscopy  HIV DISEASE - Benjamin Heaps, MD  05/08/2012 10:20 AM  Signed Per infectious disease

## 2012-10-12 ENCOUNTER — Encounter (HOSPITAL_COMMUNITY): Payer: Self-pay | Admitting: Gastroenterology

## 2012-11-30 ENCOUNTER — Other Ambulatory Visit: Payer: Medicaid Other

## 2012-12-13 ENCOUNTER — Other Ambulatory Visit (INDEPENDENT_AMBULATORY_CARE_PROVIDER_SITE_OTHER): Payer: Self-pay | Admitting: Surgery

## 2012-12-14 ENCOUNTER — Ambulatory Visit: Payer: Medicaid Other | Admitting: Infectious Diseases

## 2012-12-14 ENCOUNTER — Ambulatory Visit (INDEPENDENT_AMBULATORY_CARE_PROVIDER_SITE_OTHER): Payer: Self-pay | Admitting: Infectious Diseases

## 2012-12-14 ENCOUNTER — Encounter: Payer: Self-pay | Admitting: Infectious Diseases

## 2012-12-14 VITALS — BP 142/93 | HR 91 | Temp 98.9°F | Ht 73.0 in | Wt 232.0 lb

## 2012-12-14 DIAGNOSIS — Z23 Encounter for immunization: Secondary | ICD-10-CM

## 2012-12-14 DIAGNOSIS — Z79899 Other long term (current) drug therapy: Secondary | ICD-10-CM

## 2012-12-14 DIAGNOSIS — B20 Human immunodeficiency virus [HIV] disease: Secondary | ICD-10-CM

## 2012-12-14 DIAGNOSIS — Z113 Encounter for screening for infections with a predominantly sexual mode of transmission: Secondary | ICD-10-CM

## 2012-12-14 DIAGNOSIS — K220 Achalasia of cardia: Secondary | ICD-10-CM

## 2012-12-14 DIAGNOSIS — A630 Anogenital (venereal) warts: Secondary | ICD-10-CM

## 2012-12-14 LAB — COMPREHENSIVE METABOLIC PANEL
CO2: 27 mEq/L (ref 19–32)
Calcium: 9.4 mg/dL (ref 8.4–10.5)
Creat: 1.1 mg/dL (ref 0.50–1.35)
Glucose, Bld: 102 mg/dL — ABNORMAL HIGH (ref 70–99)
Sodium: 137 mEq/L (ref 135–145)
Total Bilirubin: 0.4 mg/dL (ref 0.3–1.2)
Total Protein: 7.4 g/dL (ref 6.0–8.3)

## 2012-12-14 LAB — CBC
Hemoglobin: 14.6 g/dL (ref 13.0–17.0)
MCH: 25.8 pg — ABNORMAL LOW (ref 26.0–34.0)
MCV: 75.4 fL — ABNORMAL LOW (ref 78.0–100.0)
RBC: 5.65 MIL/uL (ref 4.22–5.81)

## 2012-12-14 LAB — LIPID PANEL
Cholesterol: 219 mg/dL — ABNORMAL HIGH (ref 0–200)
Total CHOL/HDL Ratio: 7.6 Ratio

## 2012-12-14 MED ORDER — EMTRICITAB-RILPIVIR-TENOFOV DF 200-25-300 MG PO TABS: 1.0000 | ORAL_TABLET | Freq: Every day | ORAL | Status: DC

## 2012-12-14 MED ORDER — DOLUTEGRAVIR SODIUM 50 MG PO TABS: 50.0000 mg | ORAL_TABLET | Freq: Every day | ORAL | Status: DC

## 2012-12-14 NOTE — Progress Notes (Signed)
  Subjective:    Patient ID: Benjamin Herrera, male    DOB: 1966/05/13, 47 y.o.   MRN: [id number]  HPI 47 yo M with HIV+, achalasia and HTN. . Has previously gotten botox injections into stomach to improve abd pain. Still has some problems swallowing- feels like food and liquids get stuck. Is going to have surgery and his stomach muscle cut, was eval at [facility] 12-13-12.   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 Was taking TRV/ISN, had DRVr added at his visit 06-09-12.  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  Has lost taste after having a "bad cold" in December.  Has been having loose BM since starting on PIr. Has also had bad dreams, loosing sleep. No previous sleep assistance medicines.  Still has some problems with anal numbness, had f/u with Dr Michaell Cowing next month.  Also has had pain in his shoulders, can't sleep without heating pad. States OTCs don't work (tylenol, motrin ect)  HIV 1 RNA Quant (copies/mL)  Date Value  08/21/2012 <20   05/29/2012 2049*  12/27/2011 318*     CD4 T Cell Abs (cmm)  Date Value  08/21/2012 340*  05/29/2012 270*  12/27/2011 250*      Review of Systems     Objective:   Physical Exam  Constitutional: He appears well-developed and well-nourished.  HENT:  Mouth/Throat: No oropharyngeal exudate.  Eyes: EOM are normal. Pupils are equal, round, and reactive to light.  Neck: Neck supple.  Cardiovascular: Normal rate, regular rhythm and normal heart sounds.   Pulmonary/Chest: Effort normal and breath sounds normal.  Abdominal: Soft. Bowel sounds are normal. There is no tenderness.  Musculoskeletal: He exhibits no edema.  Lymphadenopathy:    He has no cervical adenopathy.          Assessment & Plan:

## 2012-12-14 NOTE — Assessment & Plan Note (Signed)
Appreciate [facility] GI eval. Will fax this note to them.

## 2012-12-14 NOTE — Addendum Note (Signed)
Addended by: Mariea Clonts D on: 12/14/2012 12:46 PM   Modules accepted: Orders

## 2012-12-14 NOTE — Assessment & Plan Note (Signed)
AIN on last f/u. appreciate Dr Michaell Cowing seeing pt.

## 2012-12-14 NOTE — Assessment & Plan Note (Addendum)
Will change him to Complera/DTGV. Is on no H2 or PPI. He is given condoms, flu shot. Will see him back in 4-6 weeks.

## 2012-12-15 LAB — T-HELPER CELL (CD4) - (RCID CLINIC ONLY)
CD4 % Helper T Cell: 12 % — ABNORMAL LOW (ref 33–55)
CD4 T Cell Abs: 300 uL — ABNORMAL LOW (ref 400–2700)

## 2012-12-15 LAB — HEPATITIS B SURFACE ANTIBODY,QUALITATIVE: Hep B S Ab: NONREACTIVE

## 2012-12-17 LAB — HIV-1 RNA QUANT-NO REFLEX-BLD: HIV-1 RNA Quant, Log: <1.3 {Log} (ref <1.30)

## 2012-12-18 ENCOUNTER — Telehealth: Payer: Self-pay | Admitting: *Deleted

## 2012-12-18 NOTE — Telephone Encounter (Signed)
Last lab results faxed to [facility]. At [phone] Wendall Mola

## 2013-01-03 ENCOUNTER — Telehealth: Payer: Self-pay | Admitting: Gastroenterology

## 2013-01-03 NOTE — Telephone Encounter (Signed)
Pt called wanting to know if we had heard anything from the GI doctor at [facility]. Let pt know that I could see when ID had faxed labs but we had not gotten a call for any new records. Pt states he is starting to have more difficulty swallowing and wants to know if he should schedule botox again with Dr. Arlyce Dice. Encouraged pt to call [facility] regarding being seen but pt would like to make an appt with Dr. Arlyce Dice. Please advise.

## 2013-01-03 NOTE — Telephone Encounter (Signed)
Okay to schedule endoscopy with Botox injection

## 2013-01-12 ENCOUNTER — Telehealth: Payer: Self-pay

## 2013-01-12 ENCOUNTER — Other Ambulatory Visit: Payer: Self-pay | Admitting: Gastroenterology

## 2013-01-12 DIAGNOSIS — K220 Achalasia of cardia: Secondary | ICD-10-CM

## 2013-01-12 NOTE — Telephone Encounter (Signed)
Pt states that Dr. Ashley Royalty office from [facility] needs his esophageal manometry report. Report faxed to [phone].

## 2013-01-12 NOTE — Telephone Encounter (Signed)
Pt scheduled for EGD with botox at [facility] 01/25/13. Pt to arrive there at 11:30am for a 12:30pm appt. Pt aware of appt date and time. Pt to have clear liquids only that morning until 8:30am. Pt verbalized understanding.

## 2013-01-17 ENCOUNTER — Ambulatory Visit (INDEPENDENT_AMBULATORY_CARE_PROVIDER_SITE_OTHER): Payer: Medicaid Other | Admitting: Surgery

## 2013-01-22 ENCOUNTER — Telehealth (INDEPENDENT_AMBULATORY_CARE_PROVIDER_SITE_OTHER): Payer: Self-pay

## 2013-01-22 NOTE — Telephone Encounter (Signed)
Tried to call the pt but no answer and couldn't leave messages. I r/s the pt's appt to 02/14/13 arrive at 10:45.

## 2013-01-25 ENCOUNTER — Encounter (HOSPITAL_COMMUNITY): Payer: Self-pay

## 2013-01-25 ENCOUNTER — Ambulatory Visit (HOSPITAL_COMMUNITY)
Admission: RE | Admit: 2013-01-25 | Discharge: 2013-01-25 | Disposition: A | Discharge status: Home / Self Care | Payer: Medicaid Other | Source: Ambulatory Visit | Attending: Gastroenterology | Admitting: Gastroenterology

## 2013-01-25 ENCOUNTER — Encounter (HOSPITAL_COMMUNITY)
Admission: RE | Disposition: A | Discharge status: Home / Self Care | Payer: Self-pay | Source: Ambulatory Visit | Attending: Gastroenterology

## 2013-01-25 ENCOUNTER — Encounter: Payer: Self-pay | Admitting: Internal Medicine

## 2013-01-25 ENCOUNTER — Ambulatory Visit (INDEPENDENT_AMBULATORY_CARE_PROVIDER_SITE_OTHER): Payer: Medicaid Other | Admitting: Internal Medicine

## 2013-01-25 ENCOUNTER — Telehealth (INDEPENDENT_AMBULATORY_CARE_PROVIDER_SITE_OTHER): Payer: Self-pay | Admitting: *Deleted

## 2013-01-25 ENCOUNTER — Other Ambulatory Visit: Payer: Medicaid Other

## 2013-01-25 VITALS — BP 139/88 | HR 84 | Temp 98.4°F | Ht 73.0 in | Wt 233.0 lb

## 2013-01-25 DIAGNOSIS — B20 Human immunodeficiency virus [HIV] disease: Secondary | ICD-10-CM

## 2013-01-25 DIAGNOSIS — K220 Achalasia of cardia: Secondary | ICD-10-CM

## 2013-01-25 DIAGNOSIS — R1310 Dysphagia, unspecified: Secondary | ICD-10-CM | POA: Insufficient documentation

## 2013-01-25 DIAGNOSIS — K625 Hemorrhage of anus and rectum: Secondary | ICD-10-CM

## 2013-01-25 HISTORY — PX: ESOPHAGOGASTRODUODENOSCOPY: SHX5428

## 2013-01-25 HISTORY — PX: BOTOX INJECTION: SHX5754

## 2013-01-25 LAB — CBC
MCH: 26.5 pg (ref 26.0–34.0)
MCHC: 34 g/dL (ref 30.0–36.0)
Platelets: 195 10*3/uL (ref 150–400)
RBC: 5.36 MIL/uL (ref 4.22–5.81)
RDW: 17.6 % — ABNORMAL HIGH (ref 11.5–15.5)

## 2013-01-25 LAB — COMPLETE METABOLIC PANEL WITH GFR
ALT: 33 U/L (ref 0–53)
AST: 23 U/L (ref 0–37)
Alkaline Phosphatase: 90 U/L (ref 39–117)
CO2: 25 mEq/L (ref 19–32)
Creat: 1.12 mg/dL (ref 0.50–1.35)
Sodium: 141 mEq/L (ref 135–145)
Total Bilirubin: 0.5 mg/dL (ref 0.3–1.2)
Total Protein: 6.6 g/dL (ref 6.0–8.3)

## 2013-01-25 LAB — LIPID PANEL
HDL: 27 mg/dL — ABNORMAL LOW (ref >39)
LDL Cholesterol: 146 mg/dL — ABNORMAL HIGH (ref 0–99)
Total CHOL/HDL Ratio: 7.3 Ratio
Triglycerides: 119 mg/dL (ref <150)

## 2013-01-25 SURGERY — EGD (ESOPHAGOGASTRODUODENOSCOPY)
Anesthesia: Moderate Sedation

## 2013-01-25 MED ORDER — FENTANYL CITRATE 0.05 MG/ML IJ SOLN
INTRAMUSCULAR | Status: DC | PRN
  Administered 2013-01-25 (×3): 25 ug via INTRAVENOUS

## 2013-01-25 MED ORDER — FENTANYL CITRATE 0.05 MG/ML IJ SOLN
INTRAMUSCULAR | Status: AC
  Filled 2013-01-25: qty 4

## 2013-01-25 MED ORDER — MIDAZOLAM HCL 10 MG/2ML IJ SOLN
INTRAMUSCULAR | Status: DC | PRN
  Administered 2013-01-25: 1 mg via INTRAVENOUS
  Administered 2013-01-25: 2 mg via INTRAVENOUS
  Administered 2013-01-25: 1 mg via INTRAVENOUS
  Administered 2013-01-25: 2 mg via INTRAVENOUS

## 2013-01-25 MED ORDER — SODIUM CHLORIDE 0.9 % IV SOLN: INTRAVENOUS | Status: DC

## 2013-01-25 MED ORDER — ONABOTULINUMTOXINA 100 UNITS IJ SOLR
100.0000 [IU] | Freq: Once | INTRAMUSCULAR | Status: AC
  Administered 2013-01-25: 100 [IU] via INTRAMUSCULAR
  Filled 2013-01-25: qty 100

## 2013-01-25 MED ORDER — BUTAMBEN-TETRACAINE-BENZOCAINE 2-2-14 % EX AERO
INHALATION_SPRAY | CUTANEOUS | Status: DC | PRN
  Administered 2013-01-25: 2 via TOPICAL

## 2013-01-25 MED ORDER — MIDAZOLAM HCL 10 MG/2ML IJ SOLN
INTRAMUSCULAR | Status: AC
  Filled 2013-01-25: qty 4

## 2013-01-25 NOTE — Assessment & Plan Note (Addendum)
He has had a fairly substantial amounts of bleeding by his report and does tell me that he noticed complete filling of the toilet bowl. I am going to check his hemoglobin now along with his other labs. He is going to get an endoscopy today and Dr. Arlyce Dice is aware but he did have a colonoscopy about 1 year ago so will just need treatment for hemorrhoids.   He is going to see Dr. Michaell Cowing next week.

## 2013-01-25 NOTE — Progress Notes (Signed)
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  Perhaps anoscopy or flexible sigmoidoscopy would be of benefit.  We will allow Dr. Arlyce Dice with [facility] gastroenterology to evaluate first since he is seeing him today anyway.  Hold any medications that would promote bleeding.  Would rec usual hemorrhoid care:  HEMORRHOIDS  The rectum is the last foot of your colon, and it naturally stretches to hold stool.  Hemorrhoidal piles are natural clusters of blood vessels that help the rectum and anal canal stretch to hold stool and allow bowel movements to eliminate feces.   Hemorrhoids are abnormally swollen blood vessels in the rectum.  Too much pressure in the rectum causes hemorrhoids by forcing blood to stretch and bulge the walls of the veins, sometimes even rupturing them.  Hemorrhoids can become like varicose veins you might see on a person's legs.  Most people will develop a flare of hemorrhoids in their lifetime.  When bulging hemorrhoidal veins are irritated, they can swell, burn, itch, cause pain, and bleed.  Most flares will calm down gradually own within a few weeks.  However, once hemorrhoids are created, they are difficult to get rid of completely and tend to flare more easily than the first flare.   Fortunately, good habits and simple medical treatment usually control hemorrhoids well, and surgery is needed only in severe cases. Types of Hemorrhoids:  Internal hemorrhoids usually don't initially hurt or itch; they are deep inside the rectum and usually have no sensation. If they begin to push out (prolapse), pain and burning can occur.  However, internal hemorrhoids can bleed.  Anal bleeding should not be ignored since bleeding could come from a dangerous source like colorectal cancer, so persistent rectal bleeding should be investigated by a doctor, sometimes with a colonoscopy.  External hemorrhoids cause most of the symptoms - pain,  burning, and itching. Nonirritated hemorrhoids can look like small skin tags coming out of the anus.   Thrombosed hemorrhoids can form when a hemorrhoid blood vessel bursts and causes the hemorrhoid to suddenly swell.  A purple blood clot can form in it and become an excruciatingly painful lump at the anus. Because of these unpleasant symptoms, immediate incision and drainage by a surgeon at an office visit can provide much relief of the pain.    PREVENTION Avoiding the most frequent causes listed below will prevent most cases of hemorrhoids: Constipation Hard stools Diarrhea  Constant sitting  Straining with bowel movements Sitting on the toilet for a long time  Severe coughing  episodes Pregnancy / Childbirth  Heavy Lifting  Sometimes avoiding the above triggers is difficult:  How can you avoid sitting all day if you have a seated job? Also, we try to avoid coughing and diarrhea, but sometimes it's beyond your control.  Still, there are some practical hints to help: Keep the anal and genital area clean.  Moistened tissues such as flushable wet wipes are less irritating than toilet paper.  Using irrigating showers or bottle irrigation washing gently cleans this sensitive area.   Avoid dry toilet paper when cleaning after bowel movements.  Marland Kitchen Keep the anal and genital area dry.  Lightly pat the rectal area dry.  Avoid rubbing.  Talcum or baby powders can help GET YOUR STOOLS SOFT.   This is the most important way to prevent irritated hemorrhoids.  Hard stools are like sandpaper to the anorectal canal and will cause more problems.  The  goal: ONE SOFT BOWEL MOVEMENT A DAY!  BMs from every other day to 3 times a day is a tolerable range Treat coughing, diarrhea and constipation early since irritated hemorrhoids may soon follow.  If your main job activity is seated, always stand or walk during your breaks. Make it a point to stand and walk at least 5 minutes every hour and try to shift frequently in  your chair to avoid direct rectal pressure.  Always exhale as you strain or lift. Don't hold your breath.  Do not delay or try to prevent a bowel movement when the urge is present. Exercise regularly (walking or jogging 60 minutes a day) to stimulate the bowels to move. No reading or other activity while on the toilet. If bowel movements take longer than 5 minutes, you are too constipated. AVOID CONSTIPATION Drink plenty of liquids (1 1/2 to 2 quarts of water and other fluids a day unless fluid restricted for another medical condition). Liquids that contain caffeine (coffee a, tea, soft drinks) can be dehydrating and should be avoided until constipation is controlled. Consider minimizing milk, as dairy products may be constipating. Eat plenty of fiber (30g a day ideal, more if needed).  Fiber is the undigested part of plant food that passes into the colon, acting as "natures broom" to encourage bowel motility and movement.  Fiber can absorb and hold large amounts of water. This results in a larger, bulkier stool, which is soft and easier to pass.  Eating foods high in fiber - 12 servings - such as  Vegetables: Root (potatoes, carrots, turnips), Leafy green (lettuce, salad greens, celery, spinach), High residue (cabbage, broccoli, etc.) Fruit: Fresh, Dried (prunes, apricots, cherries), Stewed (applesauce)  Whole grain breads, pasta, whole wheat Bran cereals, muffins, etc. Consider adding supplemental bulking fiber which retains large volumes of water: Psyllium ground seeds --available as Metamucil, Konsyl, Effersyllium, Per Diem Fiber, or the less expensive generic forms.  Citrucel  (methylcellulose wood fiber) . FiberCon (Polycarbophil) Polyethylene Glycol - and "artificial" fiber commonly called Miralax or Glycolax.  It is helpful for people with gassy or bloated feelings with regular fiber Flax Seed - a less gassy natural fiber  Laxatives can be useful for a short period if constipation is  severe Osmotics (Milk of Magnesia, Fleets Phospho-Soda, Magnesium Citrate)  Stimulants (Senokot,   Castor Oil,  Dulcolax, Ex-Lax)    Laxatives are not a good long-term solution as it can stress the bowels and cause too much mineral loss and dehydration.   Avoid taking laxatives for more than 7 days in a row.  AVOID DIARRHEA Switch to liquids and simpler foods for a few days to avoid stressing your intestines further. Avoid dairy products (especially milk & ice cream) for a short time.  The intestines often can lose the ability to digest lactose when stressed. Avoid foods that cause gassiness or bloating.  Typical foods include beans and other legumes, cabbage, broccoli, and dairy foods.  Every person has some sensitivity to other foods, so listen to your body and avoid those foods that trigger problems for you. Adding fiber (Citrucel, Metamucil, FiberCon, Flax seed, Miralax) gradually can help thicken stools by absorbing excess fluid and retrain the intestines to act more normally.  Slowly increase the dose over a few weeks.  Too much fiber too soon can backfire and cause cramping & bloating. Probiotics (such as active yogurt, Align, etc) may help repopulate the intestines and colon with normal bacteria and calm down a sensitive digestive tract.  Most studies show it to be of mild help, though, and such products can be costly. Medicines: Bismuth subsalicylate (ex. Kayopectate, Pepto Bismol) every 30 minutes for up to 6 doses can help control diarrhea.  Avoid if pregnant. Loperamide (Immodium) can slow down diarrhea.  Start with two tablets (4mg  total) first and then try one tablet every 6 hours.  Avoid if you are having fevers or severe pain.  If you are not better or start feeling worse, stop all medicines and call your doctor for advice Call your doctor if you are getting worse or not better.  Sometimes further testing (cultures, endoscopy, X-ray studies, bloodwork, etc) may be needed to help  diagnose and treat the cause of the diarrhea. TREATMENT OF HEMORRHOID FLARE If these preventive measures fail, you must take action right away! Hemorrhoids are one condition that can be mild in the morning and become intolerable by nightfall. Most hemorrhoidal flares take several weeks to calm down.  These suggestions can help: Warm soaks.  This helps more than any topical medication.  Use up to 8 times a day.  Usually sitz baths or sitting in a warm bathtub helps.  Sitting on moist warm towels are helpful.  Switching to ice packs/cool compresses can be helpful Normalize your bowels.  Extremes of diarrhea or constipation will make hemorrhoids worse.  One soft bowel movement a day is the goal.  Fiber can help get your bowels regular Wet wipes instead of toilet paper Pain control with a NSAID such as ibuprofen (Advil) or naproxen (Aleve) or acetaminophen (Tylenol) around the clock.  Narcotics are constipating and should be minimized if possible Topical creams contain steroids (bydrocortisone) or local anesthetic (xylocaine) can help make pain and itching more tolerable.   EVALUATION If hemorrhoids are still causing problems, you could benefit by an evaluation by a surgeon.  The surgeon will obtain a history and examine you.  If hemorrhoids are diagnosed, some therapies can be offered in the office, usually with an anoscope into the less sensitive area of the rectum: -injection of hemorrhoids (sclerotherapy) can scar the blood vessels of the swollen/enlarged hemorrhoids to help shrink them down to a more normal size -rubber banding of the enlarged hemorrhoids to help shrink them down to a more normal size -drainage of the blood clot causing a thrombosed hemorrhoid,  to relieve the severe pain   While 90% of the time such problems from hemorrhoids can be managed without preceding to surgery, sometimes the hemorrhoids require a operation to control the problem (uncontrolled bleeding, prolapse, pain, etc.).    This involves being placed under general anesthesia where the surgeon can confirm the diagnosis and remove, suture, or staple the hemorrhoid(s).  Your surgeon can help you treat the problem appropriately.     Will try to move him up to seeing him next week.  Our office may be able to seen him surgery urgently if rectal bleeding worse/not controlled.

## 2013-01-25 NOTE — Telephone Encounter (Signed)
Benjamin Herrera with Dr. Ephriam Knuckles office called due to patient having 1/2cup of blood with each bowel movement.  Spoke to Gross MD who states patient is being seen by Arlyce Dice MD today for EGD.  He wants to see what GI's assessment is.  Patient's appt moved up to next Tuesday April 8th at 345p.  Benjamin Herrera with Dr. Ephriam Knuckles office updated.

## 2013-01-25 NOTE — Op Note (Signed)
[facility] [address] [place], [address]   ENDOSCOPY PROCEDURE REPORT  PATIENT: Benjamin Herrera, Benjamin Herrera  MR#: [id number] BIRTHDATE: 1966-09-06 , 46  yrs. old GENDER: Male ENDOSCOPIST: [name], MD REFERRED BY: PROCEDURE DATE:  01/25/2013 PROCEDURE:  EGD w/ directed submucosal injection(s), any substance ASA CLASS:     Class II INDICATIONS:  Dysphagia. MEDICATIONS: These medications were titrated to patient response per physician's verbal order, Fentanyl 75 mcg IV, and Versed 6 mg IV TOPICAL ANESTHETIC: Cetacaine Spray  DESCRIPTION OF PROCEDURE: After the risks benefits and alternatives of the procedure were thoroughly explained, informed consent was obtained.  The    endoscope was introduced through the mouth and advanced to the third portion of the duodenum. Without limitations. The instrument was slowly withdrawn as the mucosa was fully examined.      Again noted was a diffusely dilated esophagus.  The GE junction was at 41cm from the incisors.  25 units (1 cc) of Botox was injected submucosally into each quadrant at the GE junction.   Again noted was a diffusely dilated esophagus.  The GE junction was at 41cm from the incisors.  25 units (1 cc) of Botox was injected submucosally into each quadrant at the GE junction.   The remainder of the upper endoscopy exam was otherwise normal.  Retroflexed views revealed no abnormalities.     The scope was then withdrawn from the patient and the procedure completed.  COMPLICATIONS: There were no complications. ENDOSCOPIC IMPRESSION: 1.  achalasia-status post Botox injection  RECOMMENDATIONS: repeat injection as needed REPEAT EXAM:  eSigned:  [name], MD 01/25/2013 12:40 PM   CC:

## 2013-01-25 NOTE — H&P (View-Only) (Signed)
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  Perhaps anoscopy or flexible sigmoidoscopy would be of benefit.  We will allow Dr. Kaplan with [facility] gastroenterology to evaluate first since he is seeing him today anyway.  Hold any medications that would promote bleeding.  Would rec usual hemorrhoid care:  HEMORRHOIDS  The rectum is the last foot of your colon, and it naturally stretches to hold stool.  Hemorrhoidal piles are natural clusters of blood vessels that help the rectum and anal canal stretch to hold stool and allow bowel movements to eliminate feces.   Hemorrhoids are abnormally swollen blood vessels in the rectum.  Too much pressure in the rectum causes hemorrhoids by forcing blood to stretch and bulge the walls of the veins, sometimes even rupturing them.  Hemorrhoids can become like varicose veins you might see on a person's legs.  Most people will develop a flare of hemorrhoids in their lifetime.  When bulging hemorrhoidal veins are irritated, they can swell, burn, itch, cause pain, and bleed.  Most flares will calm down gradually own within a few weeks.  However, once hemorrhoids are created, they are difficult to get rid of completely and tend to flare more easily than the first flare.   Fortunately, good habits and simple medical treatment usually control hemorrhoids well, and surgery is needed only in severe cases. Types of Hemorrhoids:  Internal hemorrhoids usually don't initially hurt or itch; they are deep inside the rectum and usually have no sensation. If they begin to push out (prolapse), pain and burning can occur.  However, internal hemorrhoids can bleed.  Anal bleeding should not be ignored since bleeding could come from a dangerous source like colorectal cancer, so persistent rectal bleeding should be investigated by a doctor, sometimes with a colonoscopy.  External hemorrhoids cause most of the symptoms - pain,  burning, and itching. Nonirritated hemorrhoids can look like small skin tags coming out of the anus.   Thrombosed hemorrhoids can form when a hemorrhoid blood vessel bursts and causes the hemorrhoid to suddenly swell.  A purple blood clot can form in it and become an excruciatingly painful lump at the anus. Because of these unpleasant symptoms, immediate incision and drainage by a surgeon at an office visit can provide much relief of the pain.    PREVENTION Avoiding the most frequent causes listed below will prevent most cases of hemorrhoids: Constipation Hard stools Diarrhea  Constant sitting  Straining with bowel movements Sitting on the toilet for a long time  Severe coughing  episodes Pregnancy / Childbirth  Heavy Lifting  Sometimes avoiding the above triggers is difficult:  How can you avoid sitting all day if you have a seated job? Also, we try to avoid coughing and diarrhea, but sometimes it's beyond your control.  Still, there are some practical hints to help: Keep the anal and genital area clean.  Moistened tissues such as flushable wet wipes are less irritating than toilet paper.  Using irrigating showers or bottle irrigation washing gently cleans this sensitive area.   Avoid dry toilet paper when cleaning after bowel movements.  . Keep the anal and genital area dry.  Lightly pat the rectal area dry.  Avoid rubbing.  Talcum or baby powders can help GET YOUR STOOLS SOFT.   This is the most important way to prevent irritated hemorrhoids.  Hard stools are like sandpaper to the anorectal canal and will cause more problems.  The   goal: ONE SOFT BOWEL MOVEMENT A DAY!  BMs from every other day to 3 times a day is a tolerable range Treat coughing, diarrhea and constipation early since irritated hemorrhoids may soon follow.  If your main job activity is seated, always stand or walk during your breaks. Make it a point to stand and walk at least 5 minutes every hour and try to shift frequently in  your chair to avoid direct rectal pressure.  Always exhale as you strain or lift. Don't hold your breath.  Do not delay or try to prevent a bowel movement when the urge is present. Exercise regularly (walking or jogging 60 minutes a day) to stimulate the bowels to move. No reading or other activity while on the toilet. If bowel movements take longer than 5 minutes, you are too constipated. AVOID CONSTIPATION Drink plenty of liquids (1 1/2 to 2 quarts of water and other fluids a day unless fluid restricted for another medical condition). Liquids that contain caffeine (coffee a, tea, soft drinks) can be dehydrating and should be avoided until constipation is controlled. Consider minimizing milk, as dairy products may be constipating. Eat plenty of fiber (30g a day ideal, more if needed).  Fiber is the undigested part of plant food that passes into the colon, acting as "natures broom" to encourage bowel motility and movement.  Fiber can absorb and hold large amounts of water. This results in a larger, bulkier stool, which is soft and easier to pass.  Eating foods high in fiber - 12 servings - such as  Vegetables: Root (potatoes, carrots, turnips), Leafy green (lettuce, salad greens, celery, spinach), High residue (cabbage, broccoli, etc.) Fruit: Fresh, Dried (prunes, apricots, cherries), Stewed (applesauce)  Whole grain breads, pasta, whole wheat Bran cereals, muffins, etc. Consider adding supplemental bulking fiber which retains large volumes of water: Psyllium ground seeds --available as Metamucil, Konsyl, Effersyllium, Per Diem Fiber, or the less expensive generic forms.  Citrucel  (methylcellulose wood fiber) . FiberCon (Polycarbophil) Polyethylene Glycol - and "artificial" fiber commonly called Miralax or Glycolax.  It is helpful for people with gassy or bloated feelings with regular fiber Flax Seed - a less gassy natural fiber  Laxatives can be useful for a short period if constipation is  severe Osmotics (Milk of Magnesia, Fleets Phospho-Soda, Magnesium Citrate)  Stimulants (Senokot,   Castor Oil,  Dulcolax, Ex-Lax)    Laxatives are not a good long-term solution as it can stress the bowels and cause too much mineral loss and dehydration.   Avoid taking laxatives for more than 7 days in a row.  AVOID DIARRHEA Switch to liquids and simpler foods for a few days to avoid stressing your intestines further. Avoid dairy products (especially milk & ice cream) for a short time.  The intestines often can lose the ability to digest lactose when stressed. Avoid foods that cause gassiness or bloating.  Typical foods include beans and other legumes, cabbage, broccoli, and dairy foods.  Every person has some sensitivity to other foods, so listen to your body and avoid those foods that trigger problems for you. Adding fiber (Citrucel, Metamucil, FiberCon, Flax seed, Miralax) gradually can help thicken stools by absorbing excess fluid and retrain the intestines to act more normally.  Slowly increase the dose over a few weeks.  Too much fiber too soon can backfire and cause cramping & bloating. Probiotics (such as active yogurt, Align, etc) may help repopulate the intestines and colon with normal bacteria and calm down a sensitive digestive tract.    Most studies show it to be of mild help, though, and such products can be costly. Medicines: Bismuth subsalicylate (ex. Kayopectate, Pepto Bismol) every 30 minutes for up to 6 doses can help control diarrhea.  Avoid if pregnant. Loperamide (Immodium) can slow down diarrhea.  Start with two tablets (4mg total) first and then try one tablet every 6 hours.  Avoid if you are having fevers or severe pain.  If you are not better or start feeling worse, stop all medicines and call your doctor for advice Call your doctor if you are getting worse or not better.  Sometimes further testing (cultures, endoscopy, X-ray studies, bloodwork, etc) may be needed to help  diagnose and treat the cause of the diarrhea. TREATMENT OF HEMORRHOID FLARE If these preventive measures fail, you must take action right away! Hemorrhoids are one condition that can be mild in the morning and become intolerable by nightfall. Most hemorrhoidal flares take several weeks to calm down.  These suggestions can help: Warm soaks.  This helps more than any topical medication.  Use up to 8 times a day.  Usually sitz baths or sitting in a warm bathtub helps.  Sitting on moist warm towels are helpful.  Switching to ice packs/cool compresses can be helpful Normalize your bowels.  Extremes of diarrhea or constipation will make hemorrhoids worse.  One soft bowel movement a day is the goal.  Fiber can help get your bowels regular Wet wipes instead of toilet paper Pain control with a NSAID such as ibuprofen (Advil) or naproxen (Aleve) or acetaminophen (Tylenol) around the clock.  Narcotics are constipating and should be minimized if possible Topical creams contain steroids (bydrocortisone) or local anesthetic (xylocaine) can help make pain and itching more tolerable.   EVALUATION If hemorrhoids are still causing problems, you could benefit by an evaluation by a surgeon.  The surgeon will obtain a history and examine you.  If hemorrhoids are diagnosed, some therapies can be offered in the office, usually with an anoscope into the less sensitive area of the rectum: -injection of hemorrhoids (sclerotherapy) can scar the blood vessels of the swollen/enlarged hemorrhoids to help shrink them down to a more normal size -rubber banding of the enlarged hemorrhoids to help shrink them down to a more normal size -drainage of the blood clot causing a thrombosed hemorrhoid,  to relieve the severe pain   While 90% of the time such problems from hemorrhoids can be managed without preceding to surgery, sometimes the hemorrhoids require a operation to control the problem (uncontrolled bleeding, prolapse, pain, etc.).    This involves being placed under general anesthesia where the surgeon can confirm the diagnosis and remove, suture, or staple the hemorrhoid(s).  Your surgeon can help you treat the problem appropriately.     Will try to move him up to seeing him next week.  Our office may be able to seen him surgery urgently if rectal bleeding worse/not controlled. 

## 2013-01-25 NOTE — Interval H&P Note (Signed)
History and Physical Interval Note:  01/25/2013 12:19 PM  Benjamin Herrera  has presented today for surgery, with the diagnosis of Achalasia [530.0]  The various methods of treatment have been discussed with the patient and family. After consideration of risks, benefits and other options for treatment, the patient has consented to  Procedure(s): ESOPHAGOGASTRODUODENOSCOPY (EGD) (N/A) BOTOX INJECTION (N/A) as a surgical intervention .  The patient's history has been reviewed, patient examined, no change in status, stable for surgery.  I have reviewed the patient's chart and labs.  Questions were answered to the patient's satisfaction.     The recent H&P (dated *01/25/13**) was reviewed, the patient was examined and there is no change in the patients condition since that H&P was completed.  He is scheduled for botox injection for achalasia   [name]  01/25/2013, 12:19 PM    [name]

## 2013-01-25 NOTE — Progress Notes (Signed)
  Subjective:    Patient ID: Benjamin Herrera, male    DOB: 08/17/1966, 47 y.o.   MRN: [id number]  Rectal Bleeding  Associated symptoms include rectal pain. Pertinent negatives include no diarrhea.   He comes in here today for a work in visit due to rectal bleeding. He tells me that she has noticed a significant amount of blood coming out associated with the bowel movement. He describes about a half a cup to a cup full of blood that has come out. He has had no history of this before. He has seen Dr. Michaell Cowing of general surgery in the past for anal warts and does have an upper endoscopy scheduled today by Dr. Arlyce Dice for achalasia and Botox injection. He is having no dizziness or shortness of breath.   Review of Systems  Respiratory: Negative for shortness of breath.   Gastrointestinal: Positive for blood in stool, hematochezia, anal bleeding and rectal pain. Negative for diarrhea and constipation.  Neurological: Negative for dizziness and light-headedness.       Objective:   Physical Exam  Genitourinary:  I did not appreciate any external hemorrhoids and no active bleeding.          Assessment & Plan:

## 2013-01-26 ENCOUNTER — Encounter (HOSPITAL_COMMUNITY): Payer: Self-pay | Admitting: Gastroenterology

## 2013-01-26 LAB — HIV-1 RNA QUANT-NO REFLEX-BLD: HIV 1 RNA Quant: <20 copies/mL (ref <20)

## 2013-01-26 LAB — T-HELPER CELL (CD4) - (RCID CLINIC ONLY)
CD4 % Helper T Cell: 13 % — ABNORMAL LOW (ref 33–55)
CD4 T Cell Abs: 290 uL — ABNORMAL LOW (ref 400–2700)

## 2013-01-30 ENCOUNTER — Ambulatory Visit (INDEPENDENT_AMBULATORY_CARE_PROVIDER_SITE_OTHER): Payer: Medicaid Other | Admitting: Surgery

## 2013-01-30 ENCOUNTER — Encounter (INDEPENDENT_AMBULATORY_CARE_PROVIDER_SITE_OTHER): Payer: Self-pay | Admitting: Surgery

## 2013-01-30 VITALS — BP 124/70 | HR 82 | Resp 18 | Ht 73.0 in | Wt 234.0 lb

## 2013-01-30 DIAGNOSIS — K648 Other hemorrhoids: Secondary | ICD-10-CM

## 2013-01-30 NOTE — Progress Notes (Signed)
Subjective:     Patient ID: Benjamin Herrera, male   DOB: [birth year], 47 y.o.   MRN: [id number]  HPI  Benjamin Herrera  September 28, 1966 [id number]  Patient Care Team: [name], MD as PCP - General (Infectious Diseases) [name], MD as PCP - Infectious Diseases (Infectious Diseases) [name], MD as Consulting Physician (Gastroenterology) [name], MD as Consulting Physician (General Surgery)  This patient is a 47 y.o.male who presents today for surgical evaluation at the request of Dr. Luciana Axe.   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  Status post surgery and excisions.  Wounds healed up.  No rectal bleeding last week.  Discussed with his Infectious disease doctor.  Was due to see gastroenterology.  They scoped him from above and Botox to his esophagus for his achalasia.  Deferred any evaluation of rectal bleeding to me.  Patient notes the bleeding has calmed down a little bit but persists.  It scares him.  He is smoking less.  No rectal pain.  No fevers or chills.  Patient Active Problem List  Diagnosis  . HIV DISEASE  . CANDIDIASIS OF MOUTH  . OTHER CANDIDIASIS OF OTHER SPECIFIED SITES  . SUBSTANCE ABUSE, MULTIPLE  . HYPERTENSION  . ASTHMA, UNSPECIFIED, UNSPECIFIED STATUS  . Achalasia and cardiospasm  . PSORIASIS  . ABDOMINAL PAIN, EPIGASTRIC  . FRACTURE, HUMERUS  . Abdominal pain, left lower quadrant  . Renal failure, unspecified  . Internal hemorrhoids with bleeding    Past Medical History  Diagnosis Date  . HIV (human immunodeficiency virus infection)   . Polysubstance abuse   . Boils     under arms  . Hemorrhoids   . Herpes labialis   . Psoriasis   . Achalasia and cardiospasm   . Candidiasis of unspecified site   . Dysphagia   . Asthma   . Candidiasis of mouth   . Fracture, humerus   . Hepatitis 1993    A  . 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 Pilonidal disease 01/10/2012  . Condyloma acuminatum in male of scrotum & anal canal s/p laser ablation 07/03/2012  . 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 Humeral fracture surgery    . Right shoulder replacement  2008    x 2  . Esophagogastroduodenoscopy  11/11/2011    Procedure: ESOPHAGOGASTRODUODENOSCOPY (EGD);  Surgeon: [name], MD;  Location: [facility];  Service: Endoscopy;  Laterality: N/A;  botox injection  called Pt to change time of procedure per Dr Arlyce Dice  . Esophagogastroduodenoscopy  03/10/2012    Procedure: ESOPHAGOGASTRODUODENOSCOPY (EGD);  Surgeon: [name], MD;  Location: [facility];  Service: Endoscopy;  Laterality: N/A;  . Colonoscopy  05/19/2012    Procedure: COLONOSCOPY;  Surgeon: [name], MD;  Location: [facility];  Service: Endoscopy;  Laterality: N/A;  jill trying to contact pt to come in 0830 for 930 case, phone not accepting messages  . Esophageal manometry  05/29/2012    Procedure: ESOPHAGEAL MANOMETRY (EM);  Surgeon: [name], MD;  Location: [facility];  Service: Endoscopy;  Laterality: N/A;  . Pilonidal cyst excision  07/24/2012    Procedure: CYST EXCISION PILONIDAL SIMPLE;  Surgeon: [name], MD;  Location: [facility];  Service: General;  Laterality:  N/A;  Exam Under Anesthesia,, Excision Pilonidal Disease,   . Examination under anesthesia  07/24/2012    Procedure: EXAM UNDER ANESTHESIA;  Surgeon: [name], MD;  Location: [facility];  Service: General;  Laterality: N/A;  . Wart fulguration  07/24/2012    Procedure: FULGURATION ANAL WART;  Surgeon: [name], MD;  Location: [facility];  Service: General;  Laterality: N/A;  excision of raphe mass  . Esophagogastroduodenoscopy  10/11/2012    Procedure: ESOPHAGOGASTRODUODENOSCOPY (EGD);  Surgeon: [name], MD;  Location: [facility];  Service: Endoscopy;  Laterality: N/A;  . Botox injection  10/11/2012    Procedure: BOTOX INJECTION;  Surgeon: [name], MD;  Location: [facility];  Service: Endoscopy;  Laterality: N/A;  . Esophagogastroduodenoscopy N/A 01/25/2013    Procedure: ESOPHAGOGASTRODUODENOSCOPY (EGD);  Surgeon: [name], MD;  Location: [facility];  Service: Endoscopy;  Laterality: N/A;  . Botox injection N/A 01/25/2013    Procedure: BOTOX INJECTION;  Surgeon: [name], MD;  Location: [facility];  Service: Endoscopy;  Laterality: N/A;    History   Social History  . Marital Status: Single    Spouse Name: N/A    Number of Children: N/A  . Years of Education: N/A   Occupational History  . Not on file.   Social History Main Topics  . Smoking status: Current Every Day Smoker -- 0.50 packs/day for 25 years    Types: Cigarettes  . Smokeless tobacco: Never Used  . Alcohol Use: No  . Drug Use: 7.00 per week    Special: Marijuana     Comment: pt now denies cocaine use   . Sexually Active: Yes -- Male partner(s)     Comment: pt. given condoms   Other Topics Concern  . Not on file   Social History Narrative  . No narrative on file    Family History  Problem Relation Age of Onset  . Colon cancer Neg Hx   . Stomach cancer Maternal Grandmother     ?  . Diabetes Maternal Grandmother     Current Outpatient Prescriptions  Medication Sig Dispense Refill  . albuterol (PROVENTIL HFA;VENTOLIN HFA) 108 (90 BASE) MCG/ACT inhaler Inhale 2 puffs into the lungs every 6 (six) hours as needed. For wheezing or shortness of breath  18 g  3  . Emtricitab-Rilpivir-Tenofovir 200-25-300 MG TABS Take 1 tablet by mouth daily.  30 tablet  5  . lidocaine-hydrocortisone (ANAMANTEL HC) 3-0.5 % CREA USE AS DIRECTED  98 g  0  . Dolutegravir Sodium (TIVICAY) 50 MG TABS Take 1 tablet (50 mg total) by mouth daily.  90 tablet  3  . hydrocortisone-pramoxine (ANALPRAM-HC) 2.5-1 % rectal cream Place  rectally 3 (three) times daily.  30 g  1   No current facility-administered medications for this visit.     No Known Allergies  BP 124/70  Pulse 82  Resp 18  Ht 6\' 1"  (1.854 m)  Wt 234 lb (106.142 kg)  BMI 30.88 kg/m2  No results found.   Review of Systems  Constitutional: Negative for fever, chills and diaphoresis.  HENT: Negative for sore throat, trouble swallowing and neck pain.   Eyes: Negative for photophobia and visual disturbance.  Respiratory: Negative for choking and shortness of breath.   Cardiovascular: Negative for chest pain and palpitations.  Gastrointestinal: Positive for anal bleeding. Negative for nausea, vomiting, abdominal pain, diarrhea, constipation, abdominal distention and rectal pain.  Genitourinary: Negative for dysuria, urgency, difficulty urinating and  testicular pain.  Musculoskeletal: Negative for myalgias, arthralgias and gait problem.  Skin: Negative for color change and rash.  Neurological: Negative for dizziness, speech difficulty, weakness and numbness.  Hematological: Negative for adenopathy.  Psychiatric/Behavioral: Negative for hallucinations, confusion and agitation.       Objective:   Physical Exam  Constitutional: He is oriented to person, place, and time. He appears well-developed and well-nourished. No distress.  HENT:  Head: Normocephalic.  Mouth/Throat: Oropharynx is clear and moist. No oropharyngeal exudate.  Eyes: Conjunctivae and EOM are normal. Pupils are equal, round, and reactive to light. No scleral icterus.  Neck: Normal range of motion. No tracheal deviation present.  Cardiovascular: Normal rate, normal heart sounds and intact distal pulses.   Pulmonary/Chest: Effort normal. No respiratory distress.  Abdominal: Soft. He exhibits no distension. There is no tenderness. Hernia confirmed negative in the right inguinal area and confirmed negative in the left inguinal area.  No hernias  Genitourinary:  Exam done with  assistance of male Medical Assistant in the room.  Perianal skin clean with good hygiene.  No pruritis.  No external skin tags / hemorrhoids of significance.  No pilonidal disease.  No fissure.  No abscess/fistula.    Tolerates digital and anoscopic rectal exam.  Normal sphincter tone.  No rectal masses.  Hemorrhoidal piles mildly enlarged R posterior w mild ulceration - easily bleeds  Musculoskeletal: Normal range of motion. He exhibits no tenderness.  Neurological: He is alert and oriented to person, place, and time. No cranial nerve deficit. He exhibits normal muscle tone. Coordination normal.  Skin: Skin is warm and dry. No rash noted. He is not diaphoretic.  Psychiatric: His speech is normal and behavior is normal. His mood appears anxious. His affect is not angry and not blunt.       Assessment:     Bleeding internal hemorrhoid - right posterior  No evidence of recurrent anal condylomata nor squamous cell carcinoma.    Plan:     I banded the hemorrhoid:  The anatomy & physiology of the anorectal region was discussed.  The pathophysiology of hemorrhoids and differential diagnosis was discussed.  Natural history progression  was discussed.   I stressed the importance of a bowel regimen to have daily soft bowel movements to minimize progression of disease.     The patient's symptoms are not adequately controlled.  Therefore, I recommended banding to treat the hemorrhoids.  I went over the technique, risks, benefits, and alternatives.   Goals of post-operative recovery were discussed as well.  Questions were answered.  The patient expressed understanding & wished to proceed.  The patient was positioned in the lateral decubitus position.  Perianal & rectal examination was done.  Using anoscopy, I ligated the hemorrhoids above the dentate line with banding.  The patient tolerated the procedure well.  Educational handouts further explaining the pathology, treatment options, and bowel  regimen were given as well.

## 2013-01-30 NOTE — Patient Instructions (Addendum)
HEMORRHOIDS  The rectum is the last foot of your colon, and it naturally stretches to hold stool.  Hemorrhoidal piles are natural clusters of blood vessels that help the rectum and anal canal stretch to hold stool and allow bowel movements to eliminate feces.   Hemorrhoids are abnormally swollen blood vessels in the rectum.  Too much pressure in the rectum causes hemorrhoids by forcing blood to stretch and bulge the walls of the veins, sometimes even rupturing them.  Hemorrhoids can become like varicose veins you might see on a person's legs.  Most people will develop a flare of hemorrhoids in their lifetime.  When bulging hemorrhoidal veins are irritated, they can swell, burn, itch, cause pain, and bleed.  Most flares will calm down gradually own within a few weeks.  However, once hemorrhoids are created, they are difficult to get rid of completely and tend to flare more easily than the first flare.   Fortunately, good habits and simple medical treatment usually control hemorrhoids well, and surgery is needed only in severe cases. Types of Hemorrhoids:  Internal hemorrhoids usually don't initially hurt or itch; they are deep inside the rectum and usually have no sensation. If they begin to push out (prolapse), pain and burning can occur.  However, internal hemorrhoids can bleed.  Anal bleeding should not be ignored since bleeding could come from a dangerous source like colorectal cancer, so persistent rectal bleeding should be investigated by a doctor, sometimes with a colonoscopy.  External hemorrhoids cause most of the symptoms - pain, burning, and itching. Nonirritated hemorrhoids can look like small skin tags coming out of the anus.   Thrombosed hemorrhoids can form when a hemorrhoid blood vessel bursts and causes the hemorrhoid to suddenly swell.  A purple blood clot can form in it and become an excruciatingly painful lump at the anus. Because of these unpleasant symptoms, immediate incision and  drainage by a surgeon at an office visit can provide much relief of the pain.    PREVENTION Avoiding the most frequent causes listed below will prevent most cases of hemorrhoids: Constipation Hard stools Diarrhea  Constant sitting  Straining with bowel movements Sitting on the toilet for a long time  Severe coughing  episodes Pregnancy / Childbirth  Heavy Lifting  Sometimes avoiding the above triggers is difficult:  How can you avoid sitting all day if you have a seated job? Also, we try to avoid coughing and diarrhea, but sometimes it's beyond your control.  Still, there are some practical hints to help: Keep the anal and genital area clean.  Moistened tissues such as flushable wet wipes are less irritating than toilet paper.  Using irrigating showers or bottle irrigation washing gently cleans this sensitive area.   Avoid dry toilet paper when cleaning after bowel movements.  Marland Kitchen Keep the anal and genital area dry.  Lightly pat the rectal area dry.  Avoid rubbing.  Talcum or baby powders can help GET YOUR STOOLS SOFT.   This is the most important way to prevent irritated hemorrhoids.  Hard stools are like sandpaper to the anorectal canal and will cause more problems.  The goal: ONE SOFT BOWEL MOVEMENT A DAY!  BMs from every other day to 3 times a day is a tolerable range Treat coughing, diarrhea and constipation early since irritated hemorrhoids may soon follow.  If your main job activity is seated, always stand or walk during your breaks. Make it a point to stand and walk at least 5 minutes every hour  and try to shift frequently in your chair to avoid direct rectal pressure.  Always exhale as you strain or lift. Don't hold your breath.  Do not delay or try to prevent a bowel movement when the urge is present. Exercise regularly (walking or jogging 60 minutes a day) to stimulate the bowels to move. No reading or other activity while on the toilet. If bowel movements take longer than 5 minutes,  you are too constipated. AVOID CONSTIPATION Drink plenty of liquids (1 1/2 to 2 quarts of water and other fluids a day unless fluid restricted for another medical condition). Liquids that contain caffeine (coffee a, tea, soft drinks) can be dehydrating and should be avoided until constipation is controlled. Consider minimizing milk, as dairy products may be constipating. Eat plenty of fiber (30g a day ideal, more if needed).  Fiber is the undigested part of plant food that passes into the colon, acting as "natures broom" to encourage bowel motility and movement.  Fiber can absorb and hold large amounts of water. This results in a larger, bulkier stool, which is soft and easier to pass.  Eating foods high in fiber - 12 servings - such as  Vegetables: Root (potatoes, carrots, turnips), Leafy green (lettuce, salad greens, celery, spinach), High residue (cabbage, broccoli, etc.) Fruit: Fresh, Dried (prunes, apricots, cherries), Stewed (applesauce)  Whole grain breads, pasta, whole wheat Bran cereals, muffins, etc. Consider adding supplemental bulking fiber which retains large volumes of water: Psyllium ground seeds --available as Metamucil, Konsyl, Effersyllium, Per Diem Fiber, or the less expensive generic forms.  Citrucel  (methylcellulose wood fiber) . FiberCon (Polycarbophil) Polyethylene Glycol - and "artificial" fiber commonly called Miralax or Glycolax.  It is helpful for people with gassy or bloated feelings with regular fiber Flax Seed - a less gassy natural fiber  Laxatives can be useful for a short period if constipation is severe Osmotics (Milk of Magnesia, Fleets Phospho-Soda, Magnesium Citrate)  Stimulants (Senokot,   Castor Oil,  Dulcolax, Ex-Lax)    Laxatives are not a good long-term solution as it can stress the bowels and cause too much mineral loss and dehydration.   Avoid taking laxatives for more than 7 days in a row.  AVOID DIARRHEA Switch to liquids and simpler foods for a few  days to avoid stressing your intestines further. Avoid dairy products (especially milk & ice cream) for a short time.  The intestines often can lose the ability to digest lactose when stressed. Avoid foods that cause gassiness or bloating.  Typical foods include beans and other legumes, cabbage, broccoli, and dairy foods.  Every person has some sensitivity to other foods, so listen to your body and avoid those foods that trigger problems for you. Adding fiber (Citrucel, Metamucil, FiberCon, Flax seed, Miralax) gradually can help thicken stools by absorbing excess fluid and retrain the intestines to act more normally.  Slowly increase the dose over a few weeks.  Too much fiber too soon can backfire and cause cramping & bloating. Probiotics (such as active yogurt, Align, etc) may help repopulate the intestines and colon with normal bacteria and calm down a sensitive digestive tract.  Most studies show it to be of mild help, though, and such products can be costly. Medicines: Bismuth subsalicylate (ex. Kayopectate, Pepto Bismol) every 30 minutes for up to 6 doses can help control diarrhea.  Avoid if pregnant. Loperamide (Immodium) can slow down diarrhea.  Start with two tablets (48m total) first and then try one tablet every 6 hours.  Avoid if you are having fevers or severe pain.  If you are not better or start feeling worse, stop all medicines and call your doctor for advice Call your doctor if you are getting worse or not better.  Sometimes further testing (cultures, endoscopy, X-ray studies, bloodwork, etc) may be needed to help diagnose and treat the cause of the diarrhea. TREATMENT OF HEMORRHOID FLARE If these preventive measures fail, you must take action right away! Hemorrhoids are one condition that can be mild in the morning and become intolerable by nightfall. Most hemorrhoidal flares take several weeks to calm down.  These suggestions can help: Warm soaks.  This helps more than any topical  medication.  Use up to 8 times a day.  Usually sitz baths or sitting in a warm bathtub helps.  Sitting on moist warm towels are helpful.  Switching to ice packs/cool compresses can be helpful Normalize your bowels.  Extremes of diarrhea or constipation will make hemorrhoids worse.  One soft bowel movement a day is the goal.  Fiber can help get your bowels regular Wet wipes instead of toilet paper Pain control with a NSAID such as ibuprofen (Advil) or naproxen (Aleve) or acetaminophen (Tylenol) around the clock.  Narcotics are constipating and should be minimized if possible Topical creams contain steroids (bydrocortisone) or local anesthetic (xylocaine) can help make pain and itching more tolerable.   EVALUATION If hemorrhoids are still causing problems, you could benefit by an evaluation by a surgeon.  The surgeon will obtain a history and examine you.  If hemorrhoids are diagnosed, some therapies can be offered in the office, usually with an anoscope into the less sensitive area of the rectum: -injection of hemorrhoids (sclerotherapy) can scar the blood vessels of the swollen/enlarged hemorrhoids to help shrink them down to a more normal size -rubber banding of the enlarged hemorrhoids to help shrink them down to a more normal size -drainage of the blood clot causing a thrombosed hemorrhoid,  to relieve the severe pain   While 90% of the time such problems from hemorrhoids can be managed without preceding to surgery, sometimes the hemorrhoids require a operation to control the problem (uncontrolled bleeding, prolapse, pain, etc.).   This involves being placed under general anesthesia where the surgeon can confirm the diagnosis and remove, suture, or staple the hemorrhoid(s).  Your surgeon can help you treat the problem appropriately.    ANORECTAL SURGERY: POST OP INSTRUCTIONS  1. Take your usually prescribed home medications unless otherwise directed. 2. DIET: Follow a light bland diet the first 24  hours after arrival home, such as soup, liquids, crackers, etc.  Be sure to include lots of fluids daily.  Avoid fast food or heavy meals as your are more likely to get nauseated.  Eat a low fat the next few days after surgery.   3. PAIN CONTROL: a. Pain is best controlled by a usual combination of three different methods TOGETHER: i. Ice/Heat ii. Over the counter pain medication iii. Prescription pain medication b. Most patients will experience some swelling and discomfort in the anus/rectal area. and incisions.  Ice packs or heat (30-60 minutes up to 6 times a day) will help. Use ice for the first few days to help decrease swelling and bruising, then switch to heat such as warm towels, sitz baths, warm baths, etc to help relax tight/sore spots and speed recovery.  Some people prefer to use ice alone, heat alone, alternating between ice & heat.  Experiment to what works for  you.  Swelling and bruising can take several weeks to resolve.   c. It is helpful to take an over-the-counter pain medication regularly for the first few weeks.  Choose one of the following that works best for you: i. Naproxen (Aleve, etc)  Two 220mg  tabs twice a day ii. Ibuprofen (Advil, etc) Three 200mg  tabs four times a day (every meal & bedtime) iii. Acetaminophen (Tylenol, etc) 500-650mg  four times a day (every meal & bedtime) d. A  prescription for pain medication (such as oxycodone, hydrocodone, etc) should be given to you upon discharge.  Take your pain medication as prescribed.  i. If you are having problems/concerns with the prescription medicine (does not control pain, nausea, vomiting, rash, itching, etc), please call us [phone] to see if we need to switch you to a different pain medicine that will work better for you and/or control your side effect better. ii. If you need a refill on your pain medication, please contact your pharmacy.  They will contact our office to request authorization. Prescriptions will not  be filled after 5 pm or on week-ends. 4. KEEP YOUR BOWELS REGULAR a. The goal is one bowel movement a day b. Avoid getting constipated.  Between the surgery and the pain medications, it is common to experience some constipation.  Increasing fluid intake and taking a fiber supplement (such as Metamucil, Citrucel, FiberCon, MiraLax, etc) 1-2 times a day regularly will usually help prevent this problem from occurring.  A mild laxative (prune juice, Milk of Magnesia, MiraLax, etc) should be taken according to package directions if there are no bowel movements after 48 hours. c. Watch out for diarrhea.  If you have many loose bowel movements, simplify your diet to bland foods & liquids for a few days.  Stop any stool softeners and decrease your fiber supplement.  Switching to mild anti-diarrheal medications (Kayopectate, Pepto Bismol) can help.  If this worsens or does not improve, please call us.  5. Wound Care a. Remove your bandages the day after surgery.  Unless discharge instructions indicate otherwise, leave your bandage dry and in place overnight.  Remove the bandage during your first bowel movement.   b. Allow the wound packing to fall out over the next few days.  You can trim exposed gauze / ribbon as it falls out.  You do not need to repack the wound unless instructed otherwise.  Wear an absorbent pad or soft cotton gauze in your underwear as needed to catch any drainage and help keep the area  c. Keep the area clean and dry.  Bathe / shower every day.  Keep the area clean by showering / bathing over the incision / wound.   It is okay to soak an open wound to help wash it.  Wet wipes or showers / gentle washing after bowel movements is often less traumatic than regular toilet paper. d. Bonita Quin may have some styrofoam-like soft packing in the rectum which will come out with the first bowel movement.  e. You will often notice bleeding with bowel movements.  This should slow down by the end of the first  week of surgery f. Expect some drainage.  This should slow down, too, by the end of the first week of surgery.  Wear an absorbent pad or soft cotton gauze in your underwear until the drainage stops. 6. ACTIVITIES as tolerated:   a. You may resume regular (light) daily activities beginning the next day-such as daily self-care, walking, climbing stairs-gradually increasing activities  as tolerated.  If you can walk 30 minutes without difficulty, it is safe to try more intense activity such as jogging, treadmill, bicycling, low-impact aerobics, swimming, etc. b. Save the most intensive and strenuous activity for last such as sit-ups, heavy lifting, contact sports, etc  Refrain from any heavy lifting or straining until you are off narcotics for pain control.   c. DO NOT PUSH THROUGH PAIN.  Let pain be your guide: If it hurts to do something, don't do it.  Pain is your body warning you to avoid that activity for another week until the pain goes down. d. You may drive when you are no longer taking prescription pain medication, you can comfortably sit for long periods of time, and you can safely maneuver your car and apply brakes. e. Bonita Quin may have sexual intercourse when it is comfortable.  7. FOLLOW UP in our office a. Please call [facility] at [phone] to set up an appointment to see your surgeon in the office for a follow-up appointment approximately 2 weeks after your surgery. b. Make sure that you call for this appointment the day you arrive home to insure a convenient appointment time. 10. IF YOU HAVE DISABILITY OR FAMILY LEAVE FORMS, BRING THEM TO THE OFFICE FOR PROCESSING.  DO NOT GIVE THEM TO YOUR DOCTOR.        WHEN TO CALL us [phone]: 1. Poor pain control 2. Reactions / problems with new medications (rash/itching, nausea, etc)  3. Fever over 101.5 F (38.5 C) 4. Inability to urinate 5. Nausea and/or vomiting 6. Worsening swelling or bruising 7. Continued bleeding from  incision. 8. Increased pain, redness, or drainage from the incision  The clinic staff is available to answer your questions during regular business hours (8:30am-5pm).  Please don't hesitate to call and ask to speak to one of our nurses for clinical concerns.   A surgeon from [facility] is always on call at the hospitals   If you have a medical emergency, go to the nearest emergency room or call 911.    [facility] [address], [address], [place], [place]  [address] ? MAIN: [phone] ? TOLL FREE: [phone] ? FAX [phone] [email or web address]

## 2013-02-07 ENCOUNTER — Telehealth: Payer: Self-pay | Admitting: *Deleted

## 2013-02-07 ENCOUNTER — Ambulatory Visit: Payer: Medicaid Other | Admitting: Infectious Diseases

## 2013-02-07 NOTE — Telephone Encounter (Signed)
Called patient to notify him of his missed appointment today. Put him on Dr. Moshe Cipro schedule for 02/12/13 at 3:00. Andree Coss, RN

## 2013-02-08 ENCOUNTER — Ambulatory Visit: Payer: Medicaid Other | Admitting: Infectious Diseases

## 2013-02-12 ENCOUNTER — Ambulatory Visit (INDEPENDENT_AMBULATORY_CARE_PROVIDER_SITE_OTHER): Payer: Medicaid Other | Admitting: Infectious Diseases

## 2013-02-12 ENCOUNTER — Encounter: Payer: Self-pay | Admitting: Infectious Diseases

## 2013-02-12 VITALS — BP 121/73 | HR 93 | Temp 98.3°F | Wt 235.0 lb

## 2013-02-12 DIAGNOSIS — R200 Anesthesia of skin: Secondary | ICD-10-CM

## 2013-02-12 DIAGNOSIS — R209 Unspecified disturbances of skin sensation: Secondary | ICD-10-CM

## 2013-02-12 DIAGNOSIS — B009 Herpesviral infection, unspecified: Secondary | ICD-10-CM

## 2013-02-12 DIAGNOSIS — B20 Human immunodeficiency virus [HIV] disease: Secondary | ICD-10-CM

## 2013-02-12 DIAGNOSIS — R202 Paresthesia of skin: Secondary | ICD-10-CM | POA: Insufficient documentation

## 2013-02-12 DIAGNOSIS — M791 Myalgia, unspecified site: Secondary | ICD-10-CM

## 2013-02-12 DIAGNOSIS — IMO0001 Reserved for inherently not codable concepts without codable children: Secondary | ICD-10-CM

## 2013-02-12 MED ORDER — VALACYCLOVIR HCL 1 G PO TABS: 1000.0000 mg | ORAL_TABLET | Freq: Two times a day (BID) | ORAL | Status: DC

## 2013-02-12 NOTE — Addendum Note (Signed)
Addended by: Scotty Weigelt C on: 02/12/2013 04:09 PM   Modules accepted: Orders

## 2013-02-12 NOTE — Assessment & Plan Note (Signed)
Suspect that this is positional (can't lay on R side). Will watch, if not improved him see neuro.

## 2013-02-12 NOTE — Assessment & Plan Note (Signed)
He is doing well. Will continue his current meds. Offered/refused condoms. He asks for chiropracter.

## 2013-02-12 NOTE — Progress Notes (Signed)
  Subjective:    Patient ID: Benjamin Herrera, male    DOB: August 05, 1966, 47 y.o.   MRN: [id number]  HPI  47 yo M with HIV+, achalasia and HTN. . Has previously gotten botox injections in stomach to improve abd pain. .  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 Was taking TRV/ISN, had DRVr added at his visit 06-09-12.  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  Having trouble sleeping at night. TV keeps him awake Also with intermittent numbness in his L 2nd finger for 4 days. Occas sleeps on his arm.   HIV 1 RNA Quant (copies/mL)  Date Value  01/25/2013 <20   12/14/2012 <20   08/21/2012 <20      CD4 T Cell Abs (cmm)  Date Value  01/25/2013 290*  12/14/2012 300*  08/21/2012 340*   C/o back pain, he partially attributes this to wt gain. Was seen for hemmerhoids recently. Had banding done at [facility]. Still having some BRBPR, less than previous.   Review of Systems  Constitutional: Negative for appetite change and unexpected weight change.  Gastrointestinal: Negative for diarrhea and constipation.  Genitourinary: Negative for difficulty urinating.  Musculoskeletal: Positive for back pain.       Objective:   Physical Exam  Constitutional: He appears well-developed and well-nourished.  HENT:  Mouth/Throat: No oropharyngeal exudate.  Eyes: EOM are normal. Pupils are equal, round, and reactive to light.  Neck: Neck supple.  Cardiovascular: Normal rate, regular rhythm and normal heart sounds.   Pulmonary/Chest: Effort normal and breath sounds normal.  Abdominal: Soft. Bowel sounds are normal. There is no tenderness. There is no rebound.  Genitourinary:     Musculoskeletal:       Arms: Lymphadenopathy:    He has no cervical adenopathy.          Assessment & Plan:

## 2013-02-12 NOTE — Assessment & Plan Note (Signed)
Will try to have him set up for chiropracter, may be an insurance issue.

## 2013-02-12 NOTE — Assessment & Plan Note (Signed)
Will give him trial of valtrex for 7 days.

## 2013-02-14 ENCOUNTER — Ambulatory Visit (INDEPENDENT_AMBULATORY_CARE_PROVIDER_SITE_OTHER): Payer: Medicaid Other | Admitting: Surgery

## 2013-03-06 ENCOUNTER — Encounter (INDEPENDENT_AMBULATORY_CARE_PROVIDER_SITE_OTHER): Payer: Self-pay | Admitting: Surgery

## 2013-03-06 ENCOUNTER — Ambulatory Visit (INDEPENDENT_AMBULATORY_CARE_PROVIDER_SITE_OTHER): Payer: Medicaid Other | Admitting: Surgery

## 2013-03-06 ENCOUNTER — Encounter (INDEPENDENT_AMBULATORY_CARE_PROVIDER_SITE_OTHER): Payer: Medicaid Other | Admitting: Surgery

## 2013-03-06 VITALS — BP 140/82 | HR 86 | Resp 18 | Ht 73.5 in | Wt 234.0 lb

## 2013-03-06 DIAGNOSIS — K648 Other hemorrhoids: Secondary | ICD-10-CM

## 2013-03-06 DIAGNOSIS — A630 Anogenital (venereal) warts: Secondary | ICD-10-CM

## 2013-03-06 NOTE — Patient Instructions (Addendum)
See the Handout(s) we gave you.  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  Please call our office at [phone] if you wish to schedule surgery or if you have further questions / concerns.   Hemorrhoidectomy Hemorrhoidectomy is surgery to remove hemorrhoids. Hemorrhoids are veins that have become swollen in the rectum. The rectum is the area from the bottom end of the intestines to the opening where bowel movements leave the body. Hemorrhoids can be uncomfortable. They can cause itching, bleeding and pain if a blood clot forms in them (thrombose). If hemorrhoids are small, surgery may not be needed. But if they cover a larger area, surgery is usually suggested.  LET YOUR CAREGIVER KNOW ABOUT:   Any allergies.  All medications you are taking, including:  Herbs, eyedrops, over-the-counter medications and creams.  Blood thinners (anticoagulants), aspirin or other drugs that could affect blood clotting.  Use of steroids (by mouth or as creams).  Previous problems with anesthetics, including local anesthetics.  Possibility of pregnancy, if this applies.  Any history of blood clots.  Any history of bleeding or other blood problems.  Previous surgery.  Smoking history.  Other health problems. RISKS AND COMPLICATIONS All surgery carries some risk. However, hemorrhoid surgery usually goes smoothly. Possible complications could include:  Urinary retention.  Bleeding.  Infection.  A painful incision.  A reaction to the anesthesia (this is not common). BEFORE THE PROCEDURE   Stop using aspirin and non-steroidal anti-inflammatory drugs (NSAIDs) for pain relief. This includes prescription drugs and over-the-counter drugs such as ibuprofen and naproxen. Also stop taking vitamin E. If possible, do this two weeks before your surgery.  If you take blood-thinners, ask your healthcare provider when you should stop taking them.  You  will probably have blood and urine tests done several days before your surgery.  Do not eat or drink for about 8 hours before the surgery.  Arrive at least an hour before the surgery, or whenever your surgeon recommends. This will give you time to check in and fill out any needed paperwork.  Hemorrhoidectomy is often an outpatient procedure. This means you will be able to go home the same day. Sometimes, though, people stay overnight in the hospital after the procedure. Ask your surgeon what to expect. Either way, make arrangements in advance for someone to drive you home. PROCEDURE   The preparation:  You will change into a hospital gown.  You will be given an IV. A needle will be inserted in your arm. Medication can flow directly into your body through this needle.  You might be given an enema to clear your rectum.  Once in the operating room, you will probably lie on your side or be repositioned later to lying on your stomach.  You will be given anesthesia (medication) so you will not feel anything during the surgery. The surgery often is done with local anesthesia (the area near the hemorrhoids will be numb and you will be drowsy but awake). Sometimes, general anesthesia is used (you will be asleep during the procedure).  The procedure:  There are a few different procedures for hemorrhoids. Be sure to ask you surgeon about the procedure, the risks and benefits.  Be sure to ask about what you need to do to take care of the wound, if there is one. AFTER THE PROCEDURE  You will stay in a recovery area until the anesthesia has worn off. Your blood pressure and pulse will be  checked every so often.  You may feel a lot of pain in the area of the rectum.  Take all pain medication prescribed by your surgeon. Ask before taking any over-the-counter pain medicines.  Sometimes sitting in a warm bath can help relieve your pain.  To make sure you have bowel movements without  straining:  You will probably need to take stool softeners (usually a pill) for a few days.  You should drink 8 to 10 glasses of water each day.  Your activity will be restricted for awhile. Ask your caregiver for a list of what you should and should not do while you recover. Document Released: 08/08/2009 Document Revised: 01/03/2012 Document Reviewed: 08/08/2009 [facility] Patient Information 2013 [place], [place].   COLON PREP INSTRUCTIONS for Anal/Rectal Surgery:   Obtain what you need at a pharmacy of your choice:      A bottle of Milk of Magnesia    2 Fleet enemas (generic form OK to use)   DAY PRIOR TO SURGERY:    Switch to drinking liquids or pureed foods only    1:00pm    o Take 2 oz (4 tablespoons) Milk of Magnesia.     Midnight:  Do not eat or drink anything after midnight the night before your surgery.   MORNING OF PROCEDURE:    Remember to not to drink or eat anything that morning    Upon waking up, take the 2 Fleet enemas.  o Use at least 1 hour before leaving house o Try to retain each enema for 5-10 minutes before expelling it.  This should clean your lower colon sufficiently   If you have questions or problems, please call [facility] [phone] speak to someone in the clinic department at our office       ANORECTAL SURGERY: POST OP INSTRUCTIONS  1. Take your usually prescribed home medications unless otherwise directed. 2. DIET: Follow a light bland diet the first 24 hours after arrival home, such as soup, liquids, crackers, etc.  Be sure to include lots of fluids daily.  Avoid fast food or heavy meals as your are more likely to get nauseated.  Eat a low fat the next few days after surgery.   3. PAIN CONTROL: a. Pain is best controlled by a usual combination of three different methods TOGETHER: i. Ice/Heat ii. Over the counter pain medication iii. Prescription pain medication b. Most patients will experience some swelling and  discomfort in the anus/rectal area. and incisions.  Ice packs or heat (30-60 minutes up to 6 times a day) will help. Use ice for the first few days to help decrease swelling and bruising, then switch to heat such as warm towels, sitz baths, warm baths, etc to help relax tight/sore spots and speed recovery.  Some people prefer to use ice alone, heat alone, alternating between ice & heat.  Experiment to what works for you.  Swelling and bruising can take several weeks to resolve.   c. It is helpful to take an over-the-counter pain medication regularly for the first few weeks.  Choose one of the following that works best for you: i. Naproxen (Aleve, etc)  Two 220mg  tabs twice a day ii. Ibuprofen (Advil, etc) Three 200mg  tabs four times a day (every meal & bedtime) iii. Acetaminophen (Tylenol, etc) 500-650mg  four times a day (every meal & bedtime) d. A  prescription for pain medication (such as oxycodone, hydrocodone, etc) should be given to you upon discharge.  Take your pain medication as  prescribed.  i. If you are having problems/concerns with the prescription medicine (does not control pain, nausea, vomiting, rash, itching, etc), please call us [phone] to see if we need to switch you to a different pain medicine that will work better for you and/or control your side effect better. ii. If you need a refill on your pain medication, please contact your pharmacy.  They will contact our office to request authorization. Prescriptions will not be filled after 5 pm or on week-ends. 4. KEEP YOUR BOWELS REGULAR a. The goal is one bowel movement a day b. Avoid getting constipated.  Between the surgery and the pain medications, it is common to experience some constipation.  Increasing fluid intake and taking a fiber supplement (such as Metamucil, Citrucel, FiberCon, MiraLax, etc) 1-2 times a day regularly will usually help prevent this problem from occurring.  A mild laxative (prune juice, Milk of Magnesia,  MiraLax, etc) should be taken according to package directions if there are no bowel movements after 48 hours. c. Watch out for diarrhea.  If you have many loose bowel movements, simplify your diet to bland foods & liquids for a few days.  Stop any stool softeners and decrease your fiber supplement.  Switching to mild anti-diarrheal medications (Kayopectate, Pepto Bismol) can help.  If this worsens or does not improve, please call us.  5. Wound Care a. Remove your bandages the day after surgery.  Unless discharge instructions indicate otherwise, leave your bandage dry and in place overnight.  Remove the bandage during your first bowel movement.   b. Allow the wound packing to fall out over the next few days.  You can trim exposed gauze / ribbon as it falls out.  You do not need to repack the wound unless instructed otherwise.  Wear an absorbent pad or soft cotton gauze in your underwear as needed to catch any drainage and help keep the area  c. Keep the area clean and dry.  Bathe / shower every day.  Keep the area clean by showering / bathing over the incision / wound.   It is okay to soak an open wound to help wash it.  Wet wipes or showers / gentle washing after bowel movements is often less traumatic than regular toilet paper. d. Bonita Quin may have some styrofoam-like soft packing in the rectum which will come out with the first bowel movement.  e. You will often notice bleeding with bowel movements.  This should slow down by the end of the first week of surgery f. Expect some drainage.  This should slow down, too, by the end of the first week of surgery.  Wear an absorbent pad or soft cotton gauze in your underwear until the drainage stops. 6. ACTIVITIES as tolerated:   a. You may resume regular (light) daily activities beginning the next day-such as daily self-care, walking, climbing stairs-gradually increasing activities as tolerated.  If you can walk 30 minutes without difficulty, it is safe to try more  intense activity such as jogging, treadmill, bicycling, low-impact aerobics, swimming, etc. b. Save the most intensive and strenuous activity for last such as sit-ups, heavy lifting, contact sports, etc  Refrain from any heavy lifting or straining until you are off narcotics for pain control.   c. DO NOT PUSH THROUGH PAIN.  Let pain be your guide: If it hurts to do something, don't do it.  Pain is your body warning you to avoid that activity for another week until the pain goes down.  d. Bonita Quin may drive when you are no longer taking prescription pain medication, you can comfortably sit for long periods of time, and you can safely maneuver your car and apply brakes. e. Bonita Quin may have sexual intercourse when it is comfortable.  7. FOLLOW UP in our office a. Please call [facility] at [phone] to set up an appointment to see your surgeon in the office for a follow-up appointment approximately 2 weeks after your surgery. b. Make sure that you call for this appointment the day you arrive home to insure a convenient appointment time. 10. IF YOU HAVE DISABILITY OR FAMILY LEAVE FORMS, BRING THEM TO THE OFFICE FOR PROCESSING.  DO NOT GIVE THEM TO YOUR DOCTOR.        WHEN TO CALL us [phone]: 1. Poor pain control 2. Reactions / problems with new medications (rash/itching, nausea, etc)  3. Fever over 101.5 F (38.5 C) 4. Inability to urinate 5. Nausea and/or vomiting 6. Worsening swelling or bruising 7. Continued bleeding from incision. 8. Increased pain, redness, or drainage from the incision  The clinic staff is available to answer your questions during regular business hours (8:30am-5pm).  Please don't hesitate to call and ask to speak to one of our nurses for clinical concerns.   A surgeon from [facility] is always on call at the hospitals   If you have a medical emergency, go to the nearest emergency room or call 911.    [facility] [address],  [address], [place], [place]  [address] ? MAIN: [phone] ? TOLL FREE: [phone] ? FAX [phone] [email or web address]  Genital Warts Genital warts are a sexually transmitted infection. They may appear as small bumps on the tissues of the genital area. CAUSES  Genital warts are caused by a virus called human papillomavirus (HPV). HPV is the most common sexually transmitted disease (STD) and infection of the sex organs. This infection is spread by having unprotected sex with an infected person. It can be spread by vaginal, anal, and oral sex. Many people do not know they are infected. They may be infected for years without problems. However, even if they do not have problems, they can unknowingly pass the infection to their sexual partners. SYMPTOMS   Itching and irritation in the genital area.  Warts that bleed.  Painful sexual intercourse. DIAGNOSIS  Warts are usually recognized with the naked eye on the vagina, vulva, perineum, anus, and rectum. Certain tests can also diagnose genital warts, such as:  A Pap test.  A tissue sample (biopsy) exam.  Colposcopy. A magnifying tool is used to examine the vagina and cervix. The HPV cells will change color when certain solutions are used. TREATMENT  Warts can be removed by:  Applying certain chemicals, such as cantharidin or podophyllin.  Liquid nitrogen freezing (cryotherapy).  Immunotherapy with candida or trichophyton injections.  Laser treatment.  Burning with an electrified probe (electrocautery).  Interferon injections.  Surgery. PREVENTION  HPV vaccination can help prevent HPV infections that cause genital warts and that cause cancer of the cervix. It is recommended that the vaccination be given to people between the ages 57 to 49 years old. The vaccine might not work as well or might not work at all if you already have HPV. It should not be given to pregnant women. HOME CARE INSTRUCTIONS   It is important  to follow your caregiver's instructions. The warts will not go away without treatment. Repeat treatments are often needed  to get rid of warts. Even after it appears that the warts are gone, the normal tissue underneath often remains infected.  Do not try to treat genital warts with medicine used to treat hand warts. This type of medicine is strong and can burn the skin in the genital area, causing more damage.  Tell your past and current sexual partner(s) that you have genital warts. They may be infected also and need treatment.  Avoid sexual contact while being treated.  Do not touch or scratch the warts. The infection may spread to other parts of your body.  Women with genital warts should have a cervical cancer check (Pap test) at least once a year. This type of cancer is slow-growing and can be cured if found early. Chances of developing cervical cancer are increased with HPV.  Inform your obstetrician about your warts in the event of pregnancy. This virus can be passed to the baby's respiratory tract. Discuss this with your caregiver.  Use a condom during sexual intercourse. Following treatment, the use of condoms will help prevent reinfection.  Ask your caregiver about using over-the-counter anti-itch creams. SEEK MEDICAL CARE IF:   Your treated skin becomes red, swollen, or painful.  You have a fever.  You feel generally ill.  You feel little lumps in and around your genital area.  You are bleeding or have painful sexual intercourse. MAKE SURE YOU:   Understand these instructions.  Will watch your condition.  Will get help right away if you are not doing well or get worse. Document Released: 10/08/2000 Document Revised: 01/03/2012 Document Reviewed: 04/19/2011 [facility] Patient Information 2013 [place], [place].

## 2013-03-06 NOTE — Progress Notes (Signed)
Subjective:     Patient ID: Benjamin Herrera, male   DOB: Sep 16, 1966, 47 y.o.   MRN: [id number]  HPI   Benjamin Herrera  1966/04/08 [id number]  Patient Care Team: [name], MD as PCP - General (Infectious Diseases) [name], MD as PCP - Infectious Diseases (Infectious Diseases) [name], MD as Consulting Physician (Gastroenterology) [name], MD as Consulting Physician (General Surgery)  This patient is a 47 y.o.male who presents today for surgical evaluation at the request of Dr. Luciana Axe.   Reason for visit: Rectal bleeding - perisitent  HIV-positive male with history of anal warts, pilonidal disease, and anal intraepithelial neoplasia.  Status post surgery and excisions Sep2013.  Wounds healed up.  No rectal bleeding last week.  Discussed with his Infectious disease doctor.  Was due to see gastroenterology.  They scoped him from above and Botox to his esophagus for his achalasia.  Deferred any evaluation of rectal bleeding to me.    I located a internal hemorrhoid with some ulceration.  I banded it last month.  He thinks the band fell off in a few days.  Patient notes the bleeding has calmed down a little bit but persists.  It scares him.  He is smoking less.  No rectal pain.  No fevers or chills.  Patient Active Problem List   Diagnosis Date Noted  . 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  . Numbness and tingling in left hand 02/12/2013  . Myalgia 02/12/2013  . Internal hemorrhoids with bleeding 01/30/2013  . Renal failure, unspecified 08/30/2012  . Abdominal pain, left lower quadrant 01/20/2011  . ABDOMINAL PAIN, EPIGASTRIC 09/25/2010  . HYPERTENSION 08/31/2010  . PSORIASIS 11/20/2009  . Achalasia and cardiospasm 08/14/2009  . OTHER CANDIDIASIS OF OTHER SPECIFIED SITES 06/26/2009  . CANDIDIASIS OF MOUTH 05/16/2008  . SUBSTANCE ABUSE, MULTIPLE 05/16/2008  . ASTHMA, UNSPECIFIED, UNSPECIFIED STATUS 05/16/2008  . HIV DISEASE 09/03/2006  .  FRACTURE, HUMERUS 09/03/2006    Past Medical History  Diagnosis Date  . HIV (human immunodeficiency virus infection)   . Polysubstance abuse   . Boils     under arms  . Hemorrhoids   . Herpes labialis   . Psoriasis   . Achalasia and cardiospasm   . Candidiasis of unspecified site   . Dysphagia   . Asthma   . Candidiasis of mouth   . Fracture, humerus   . Hepatitis 1993    A  . 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 Pilonidal disease 01/10/2012  . Condyloma acuminatum in male of scrotum & anal canal s/p laser ablation 07/03/2012  . 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 Humeral fracture surgery    . Right shoulder replacement  2008    x 2  . Esophagogastroduodenoscopy  11/11/2011    Procedure: ESOPHAGOGASTRODUODENOSCOPY (EGD);  Surgeon: [name], MD;  Location: [facility];  Service: Endoscopy;  Laterality: N/A;  botox injection  called Pt to change time of procedure per Dr Arlyce Dice  . Esophagogastroduodenoscopy  03/10/2012    Procedure: ESOPHAGOGASTRODUODENOSCOPY (EGD);  Surgeon: [name], MD;  Location: [facility];  Service: Endoscopy;  Laterality: N/A;  . Colonoscopy  05/19/2012    Procedure: COLONOSCOPY;  Surgeon: [name], MD;  Location: [facility];  Service: Endoscopy;  Laterality: N/A;  jill trying to contact pt to  come in 0830 for 930 case, phone not accepting messages  . Esophageal manometry  05/29/2012    Procedure: ESOPHAGEAL MANOMETRY (EM);  Surgeon: [name], MD;  Location: [facility];  Service: Endoscopy;  Laterality: N/A;  . Pilonidal cyst excision  07/24/2012    Procedure: CYST EXCISION PILONIDAL SIMPLE;  Surgeon: [name], MD;  Location: [facility];  Service: General;  Laterality: N/A;  Exam Under Anesthesia,, Excision Pilonidal Disease,   . Examination under anesthesia  07/24/2012    Procedure:  EXAM UNDER ANESTHESIA;  Surgeon: [name], MD;  Location: [facility];  Service: General;  Laterality: N/A;  . Wart fulguration  07/24/2012    Procedure: FULGURATION ANAL WART;  Surgeon: [name], MD;  Location: [facility];  Service: General;  Laterality: N/A;  excision of raphe mass  . Esophagogastroduodenoscopy  10/11/2012    Procedure: ESOPHAGOGASTRODUODENOSCOPY (EGD);  Surgeon: [name], MD;  Location: [facility];  Service: Endoscopy;  Laterality: N/A;  . Botox injection  10/11/2012    Procedure: BOTOX INJECTION;  Surgeon: [name], MD;  Location: [facility];  Service: Endoscopy;  Laterality: N/A;  . Esophagogastroduodenoscopy N/A 01/25/2013    Procedure: ESOPHAGOGASTRODUODENOSCOPY (EGD);  Surgeon: [name], MD;  Location: [facility];  Service: Endoscopy;  Laterality: N/A;  . Botox injection N/A 01/25/2013    Procedure: BOTOX INJECTION;  Surgeon: [name], MD;  Location: [facility];  Service: Endoscopy;  Laterality: N/A;    History   Social History  . Marital Status: Single    Spouse Name: N/A    Number of Children: N/A  . Years of Education: N/A   Occupational History  . Not on file.   Social History Main Topics  . Smoking status: Current Every Day Smoker -- 0.50 packs/day for 25 years    Types: Cigarettes  . Smokeless tobacco: Never Used  . Alcohol Use: No  . Drug Use: 7.00 per week    Special: Marijuana     Comment: pt now denies cocaine use   . Sexually Active: Yes -- Male partner(s)     Comment: pt. given condoms   Other Topics Concern  . Not on file   Social History Narrative  . No narrative on file    Family History  Problem Relation Age of Onset  . Colon cancer Neg Hx   . Stomach cancer Maternal Grandmother     ?  . Diabetes Maternal Grandmother     Current Outpatient Prescriptions  Medication Sig Dispense Refill  . albuterol (PROVENTIL HFA;VENTOLIN HFA) 108 (90 BASE) MCG/ACT inhaler Inhale 2 puffs into the lungs  every 6 (six) hours as needed. For wheezing or shortness of breath  18 g  3  . Dolutegravir Sodium (TIVICAY) 50 MG TABS Take 1 tablet (50 mg total) by mouth daily.  90 tablet  3  . Emtricitab-Rilpivir-Tenofovir 200-25-300 MG TABS Take 1 tablet by mouth daily.  30 tablet  5  . hydrocortisone-pramoxine (ANALPRAM-HC) 2.5-1 % rectal cream Place rectally 3 (three) times daily.  30 g  1  . lidocaine-hydrocortisone (ANAMANTEL HC) 3-0.5 % CREA USE AS DIRECTED  98 g  0  . valACYclovir (VALTREX) 1000 MG tablet Take 1 tablet (1,000 mg total) by mouth 2 (two) times daily.  20 tablet  0   No current facility-administered medications for this visit.     No Known Allergies  BP 140/82  Pulse 86  Resp 18  Ht 6' 1.5" (1.867 m)  Wt 234 lb (106.142 kg)  BMI 30.45 kg/m2  No results found.   Review of Systems  Constitutional: Negative for fever, chills and diaphoresis.  HENT: Negative for sore throat, trouble swallowing and neck pain.   Eyes: Negative for photophobia and visual disturbance.  Respiratory: Negative for choking and shortness of breath.   Cardiovascular: Negative for chest pain and palpitations.  Gastrointestinal: Positive for anal bleeding. Negative for nausea, vomiting, abdominal pain, diarrhea, constipation, abdominal distention and rectal pain.  Genitourinary: Negative for dysuria, urgency, difficulty urinating and testicular pain.  Musculoskeletal: Negative for myalgias, arthralgias and gait problem.  Skin: Negative for color change and rash.  Neurological: Negative for dizziness, speech difficulty, weakness and numbness.  Hematological: Negative for adenopathy.  Psychiatric/Behavioral: Negative for hallucinations, confusion and agitation.       Objective:   Physical Exam  Constitutional: He is oriented to person, place, and time. He appears well-developed and well-nourished. No distress.  HENT:  Head: Normocephalic.  Mouth/Throat: Oropharynx is clear and moist. No  oropharyngeal exudate.  Eyes: Conjunctivae and EOM are normal. Pupils are equal, round, and reactive to light. No scleral icterus.  Neck: Normal range of motion. No tracheal deviation present.  Cardiovascular: Normal rate, normal heart sounds and intact distal pulses.   Pulmonary/Chest: Effort normal. No respiratory distress.  Abdominal: Soft. He exhibits no distension. There is no tenderness. Hernia confirmed negative in the right inguinal area and confirmed negative in the left inguinal area.  No hernias  Genitourinary:  Exam done with assistance of male Medical Assistant in the room.  Perianal skin clean with good hygiene.  No pruritis.  Few small warts L lateral perianal.  No pilonidal disease.  No fissure.  No abscess/fistula.    Tolerates digital and anoscopic rectal exam.  Normal sphincter tone.  No rectal masses.  Hemorrhoidal piles moderately enlarged.  R posterior largest with continued ulceration - easily bleeds  Musculoskeletal: Normal range of motion. He exhibits no tenderness.  Neurological: He is alert and oriented to person, place, and time. No cranial nerve deficit. He exhibits normal muscle tone. Coordination normal.  Skin: Skin is warm and dry. No rash noted. He is not diaphoretic.  Psychiatric: His speech is normal and behavior is normal. His mood appears anxious. His affect is not angry and not blunt.       Assessment:     Bleeding internal hemorrhoid - right posterior.  Perisistent in the setting of enlarged hemorrhoids  Some recurrent anal condylomata    Plan:     Because banding failed, I recommended removal in the operating room.  If there is significant hemorrhoidal disease, do THD ligation of other piles as well.   The anatomy & physiology of the anorectal region was discussed.  The pathophysiology of hemorrhoids and differential diagnosis was discussed.  Natural history risks without surgery was discussed.   I stressed the importance of a bowel regimen to  have daily soft bowel movements to minimize progression of disease.  Interventions such as sclerotherapy & banding were discussed.  The patient's symptoms are not adequately controlled by medicines and other non-operative treatments.  I feel the risks & problems of no surgery outweigh the operative risks; therefore, I recommended surgery to treat the hemorrhoids by ligation, pexy, and possible resection.  Risks such as bleeding, infection, need for further treatment, heart attack, death, and other risks were discussed.   I noted a good likelihood this will help address the problem.  Goals of post-operative recovery were  discussed as well.  Possibility that this will not correct all symptoms was explained.  Post-operative pain, bleeding, constipation, and other problems after surgery were discussed.  We will work to minimize complications.   Educational handouts further explaining the pathology, treatment options, and bowel regimen were given as well.  Questions were answered.  The patient expresses understanding & wishes to proceed with surgery.   Would ablate condyloma while in there.  Hopefully just with needle tip cautery:  The anatomy & physiology of the anorectal region was discussed.  The pathophysiology of anorectal warts and differential diagnosis was discussed.  Natural history risks without surgery was discussed such as further growth and cancer.   I stressed the importance of office follow-up to catch early recurrence & minimize/halt progression of disease.  Interventions such as cauterization by topical agents were discussed.  The patient's symptoms are not adequately controlled by non-operative treatments.  I feel the risks & problems of no surgery outweigh the operative risks; therefore, I recommended surgery to treat the anal warts by removal, ablation and/or cauterization.  Risks such as bleeding, infection, need for further treatment, heart attack, death, and other risks were discussed.    I noted a good likelihood this will help address the problem. Goals of post-operative recovery were discussed as well.  Possibility that this will not correct all symptoms was explained.  Post-operative pain, bleeding, constipation, and other problems after surgery were discussed.  We will work to minimize complications.   Educational handouts further explaining the pathology, treatment options, and bowel regimen were given as well.  Questions were answered.  The patient expresses understanding & wishes to proceed with surgery.

## 2013-03-16 ENCOUNTER — Other Ambulatory Visit (INDEPENDENT_AMBULATORY_CARE_PROVIDER_SITE_OTHER): Payer: Self-pay | Admitting: Surgery

## 2013-03-16 DIAGNOSIS — A630 Anogenital (venereal) warts: Secondary | ICD-10-CM

## 2013-03-17 ENCOUNTER — Encounter (HOSPITAL_COMMUNITY): Payer: Self-pay | Admitting: Emergency Medicine

## 2013-03-17 ENCOUNTER — Emergency Department (HOSPITAL_COMMUNITY)
Admission: EM | Admit: 2013-03-17 | Discharge: 2013-03-17 | Disposition: A | Discharge status: Home / Self Care | Payer: Medicaid Other | Attending: Emergency Medicine | Admitting: Emergency Medicine

## 2013-03-17 DIAGNOSIS — R109 Unspecified abdominal pain: Secondary | ICD-10-CM | POA: Insufficient documentation

## 2013-03-17 DIAGNOSIS — Z8679 Personal history of other diseases of the circulatory system: Secondary | ICD-10-CM | POA: Insufficient documentation

## 2013-03-17 DIAGNOSIS — Z872 Personal history of diseases of the skin and subcutaneous tissue: Secondary | ICD-10-CM | POA: Insufficient documentation

## 2013-03-17 DIAGNOSIS — R338 Other retention of urine: Secondary | ICD-10-CM

## 2013-03-17 DIAGNOSIS — Z85828 Personal history of other malignant neoplasm of skin: Secondary | ICD-10-CM | POA: Insufficient documentation

## 2013-03-17 DIAGNOSIS — J45909 Unspecified asthma, uncomplicated: Secondary | ICD-10-CM | POA: Insufficient documentation

## 2013-03-17 DIAGNOSIS — Z8781 Personal history of (healed) traumatic fracture: Secondary | ICD-10-CM | POA: Insufficient documentation

## 2013-03-17 DIAGNOSIS — Z8719 Personal history of other diseases of the digestive system: Secondary | ICD-10-CM | POA: Insufficient documentation

## 2013-03-17 DIAGNOSIS — F17200 Nicotine dependence, unspecified, uncomplicated: Secondary | ICD-10-CM | POA: Insufficient documentation

## 2013-03-17 DIAGNOSIS — Z79899 Other long term (current) drug therapy: Secondary | ICD-10-CM | POA: Insufficient documentation

## 2013-03-17 DIAGNOSIS — Z8619 Personal history of other infectious and parasitic diseases: Secondary | ICD-10-CM | POA: Insufficient documentation

## 2013-03-17 DIAGNOSIS — F1210 Cannabis abuse, uncomplicated: Secondary | ICD-10-CM | POA: Insufficient documentation

## 2013-03-17 DIAGNOSIS — R339 Retention of urine, unspecified: Secondary | ICD-10-CM | POA: Insufficient documentation

## 2013-03-17 DIAGNOSIS — B20 Human immunodeficiency virus [HIV] disease: Secondary | ICD-10-CM | POA: Insufficient documentation

## 2013-03-17 LAB — URINALYSIS, ROUTINE W REFLEX MICROSCOPIC
Glucose, UA: NEGATIVE mg/dL
Ketones, ur: 15 mg/dL — AB
Leukocytes, UA: NEGATIVE
Nitrite: NEGATIVE
Protein, ur: NEGATIVE mg/dL

## 2013-03-17 LAB — URINE MICROSCOPIC-ADD ON

## 2013-03-17 MED ORDER — OXYCODONE-ACETAMINOPHEN 5-325 MG PO TABS
2.0000 | ORAL_TABLET | Freq: Once | ORAL | Status: AC
  Administered 2013-03-17: 2 via ORAL
  Filled 2013-03-17: qty 2

## 2013-03-17 NOTE — ED Provider Notes (Signed)
History     CSN: [id number]  Arrival date & time 03/17/13  0130   First MD Initiated Contact with Patient 03/17/13 0301      Chief Complaint  Patient presents with  . Urinary Retention    (Consider location/radiation/quality/duration/timing/severity/associated sxs/prior treatment) HPI Patient is a 47 yo man with AIDS who is POD #1 s/p hemorroidectomy and presents with urinary retention. He voided once post operatively. But has had urinary retention x approx 10 hrs. He presented with suprapubic pressure like moderate and nonradiating pain. Denies fever. No history of urinary retention. No nausea or vomiting. Denies any excacerbating or relieving factors.   Past Medical History  Diagnosis Date  . HIV (human immunodeficiency virus infection)   . Polysubstance abuse   . Boils     under arms  . Hemorrhoids   . Herpes labialis   . Psoriasis   . Achalasia and cardiospasm   . Candidiasis of unspecified site   . Dysphagia   . Asthma   . Candidiasis of mouth   . Fracture, humerus   . Hepatitis 1993    A  . 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 Pilonidal disease 01/10/2012  . Condyloma acuminatum in male of scrotum & anal canal s/p laser ablation 07/03/2012  . 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 Humeral fracture surgery    . Right shoulder replacement  2008    x 2  . Esophagogastroduodenoscopy  11/11/2011    Procedure: ESOPHAGOGASTRODUODENOSCOPY (EGD);  Surgeon: [name], MD;  Location: [facility];  Service: Endoscopy;  Laterality: N/A;  botox injection  called Pt to change time of procedure per Dr Arlyce Dice  . Esophagogastroduodenoscopy  03/10/2012    Procedure: ESOPHAGOGASTRODUODENOSCOPY (EGD);  Surgeon: [name], MD;  Location: [facility];  Service: Endoscopy;  Laterality: N/A;  . Colonoscopy  05/19/2012    Procedure:  COLONOSCOPY;  Surgeon: [name], MD;  Location: [facility];  Service: Endoscopy;  Laterality: N/A;  jill trying to contact pt to come in 0830 for 930 case, phone not accepting messages  . Esophageal manometry  05/29/2012    Procedure: ESOPHAGEAL MANOMETRY (EM);  Surgeon: [name], MD;  Location: [facility];  Service: Endoscopy;  Laterality: N/A;  . Pilonidal cyst excision  07/24/2012    Procedure: CYST EXCISION PILONIDAL SIMPLE;  Surgeon: [name], MD;  Location: [facility];  Service: General;  Laterality: N/A;  Exam Under Anesthesia,, Excision Pilonidal Disease,   . Examination under anesthesia  07/24/2012    Procedure: EXAM UNDER ANESTHESIA;  Surgeon: [name], MD;  Location: [facility];  Service: General;  Laterality: N/A;  . Wart fulguration  07/24/2012    Procedure: FULGURATION ANAL WART;  Surgeon: [name], MD;  Location: [facility];  Service: General;  Laterality: N/A;  excision of raphe mass  . Esophagogastroduodenoscopy  10/11/2012    Procedure: ESOPHAGOGASTRODUODENOSCOPY (EGD);  Surgeon: [name], MD;  Location: [facility];  Service: Endoscopy;  Laterality: N/A;  . Botox injection  10/11/2012    Procedure: BOTOX INJECTION;  Surgeon: [name], MD;  Location: [facility];  Service: Endoscopy;  Laterality: N/A;  . Esophagogastroduodenoscopy N/A 01/25/2013    Procedure: ESOPHAGOGASTRODUODENOSCOPY (EGD);  Surgeon: [name], MD;  Location: [facility];  Service: Endoscopy;  Laterality: N/A;  . Botox injection N/A 01/25/2013  Procedure: BOTOX INJECTION;  Surgeon: [name], MD;  Location: [facility];  Service: Endoscopy;  Laterality: N/A;    Family History  Problem Relation Age of Onset  . Colon cancer Neg Hx   . Stomach cancer Maternal Grandmother     ?  . Diabetes Maternal Grandmother     History  Substance Use Topics  . Smoking status: Current Every Day Smoker -- 0.50 packs/day for 25 years    Types: Cigarettes  . Smokeless  tobacco: Never Used  . Alcohol Use: No      Review of Systems Gen: no weight loss, fevers, chills, night sweats Eyes: no discharge or drainage, no occular pain or visual changes Nose: no epistaxis or rhinorrhea Mouth: no dental pain, no sore throat Neck: no neck pain Lungs: no SOB, cough, wheezing CV: no chest pain, palpitations, dependent edema or orthopnea Abd: no abdominal pain, nausea, vomiting. postop rectal pain is present and described as burning.  GU: As per history of present illness, otherwise negative MSK: no myalgias or arthralgias Neuro: no headache, no focal neurologic deficits Skin: no rash Psyche: negative.  Allergies  Review of patient's allergies indicates no known allergies.  Home Medications   Current Outpatient Rx  Name  Route  Sig  Dispense  Refill  . lamiVUDine-zidovudine (COMBIVIR) 150-300 MG per tablet   Oral   Take 1 tablet by mouth daily.         Marland Kitchen oxyCODONE-acetaminophen (PERCOCET/ROXICET) 5-325 MG per tablet   Oral   Take 1 tablet by mouth every 4 (four) hours as needed for pain.           BP 145/86  Pulse 88  Temp(Src) 98.5 F (36.9 C) (Oral)  Resp 20  SpO2 96%  Physical Exam Gen: well developed and well nourished appearing, laying in right lateral decubitus position Head: NCAT Eyes: PERL, EOMI Nose: no epistaixis or rhinorrhea Mouth/throat: mucosa is moist and pink Neck: supple, no stridor Lungs: CTA B, no wheezing, rhonchi or rales Abd: soft, notender, nondistended Back: no ttp, no cva ttp Skin: no rashese, wnl Neuro: CN ii-xii grossly intact, no focal deficits Psyche; normal affect,  calm and cooperative.   ED Course  Procedures (including critical care time)  Results for orders placed during the hospital encounter of 03/17/13 (from the past 24 hour(s))  URINALYSIS, ROUTINE W REFLEX MICROSCOPIC     Status: Abnormal   Collection Time    03/17/13  2:31 AM      Result Value Range   Color, Urine YELLOW  YELLOW    APPearance CLOUDY (*) CLEAR   Specific Gravity, Urine 1.027  1.005 - 1.030   pH 6.5  5.0 - 8.0   Glucose, UA NEGATIVE  NEGATIVE mg/dL   Hgb urine dipstick SMALL (*) NEGATIVE   Bilirubin Urine NEGATIVE  NEGATIVE   Ketones, ur 15 (*) NEGATIVE mg/dL   Protein, ur NEGATIVE  NEGATIVE mg/dL   Urobilinogen, UA 0.2  0.0 - 1.0 mg/dL   Nitrite NEGATIVE  NEGATIVE   Leukocytes, UA NEGATIVE  NEGATIVE  URINE MICROSCOPIC-ADD ON     Status: Abnormal   Collection Time    03/17/13  2:31 AM      Result Value Range   WBC, UA 0-2  <3 WBC/hpf   RBC / HPF 3-6  <3 RBC/hpf   Bacteria, UA RARE  RARE   Casts HYALINE CASTS (*) NEGATIVE   Urine-Other MUCOUS PRESENT       MDM  Patient with  post op urinary retention. No signs of uti. Had 500 mL of urine in bladder on bladder scan. Foley cath placed and drained same amount.   Patient now demanding that Foley catheter be removed. I have explained the high risk of recurrent urinary retention and recommended that we discharge with catheter in place and leg bag with plan for Urologic follow up. Patient acknowledges my counsel but says he just can't stand to be discharged from ED with Foley in place.  I have suggested that we monitor for sponatneous voiding before we discharge but, patient declines and says he just wants to go home and sleep. Counseled to f/u with [facility], as scheduled and return to the ED if urinary retention recurrs.         Brandt Loosen, MD 03/17/13 0400

## 2013-03-17 NOTE — ED Notes (Signed)
Patient complaining that he did not know how bad this surgery would be, upset that he could not void and that should have been taken care of before he went home.  Explained that after some surgeries it takes several hours for the bladder to work properly.

## 2013-03-17 NOTE — ED Notes (Addendum)
PT. REPORTS URINARY RETENTION ONSET THIS AFTERNOON AFTER HEMORRHOIDECTOMY SURGERY BY DR. Acquanetta Belling YESTERDAY MORNING .

## 2013-03-17 NOTE — ED Notes (Signed)
Patient had a hemorroidectomy Friday.  Went home and has not been able to void since about 1pm.  Bladder scanner used and able to read approx .  Dr. Lavella Lemons notified

## 2013-03-19 ENCOUNTER — Other Ambulatory Visit (INDEPENDENT_AMBULATORY_CARE_PROVIDER_SITE_OTHER): Payer: Self-pay | Admitting: Surgery

## 2013-03-20 ENCOUNTER — Telehealth (INDEPENDENT_AMBULATORY_CARE_PROVIDER_SITE_OTHER): Payer: Self-pay | Admitting: General Surgery

## 2013-03-20 NOTE — Telephone Encounter (Signed)
Spoke with patient he is aware of po f/u visit on 04/03/13 at 1045am

## 2013-03-23 ENCOUNTER — Telehealth (INDEPENDENT_AMBULATORY_CARE_PROVIDER_SITE_OTHER): Payer: Self-pay | Admitting: General Surgery

## 2013-03-23 ENCOUNTER — Other Ambulatory Visit (INDEPENDENT_AMBULATORY_CARE_PROVIDER_SITE_OTHER): Payer: Self-pay | Admitting: Surgery

## 2013-03-23 DIAGNOSIS — K6289 Other specified diseases of anus and rectum: Secondary | ICD-10-CM

## 2013-03-23 MED ORDER — LIDOCAINE HCL 2 % EX GEL: CUTANEOUS | Status: DC

## 2013-03-23 NOTE — Telephone Encounter (Signed)
done

## 2013-03-23 NOTE — Telephone Encounter (Signed)
Pt of Dr. Michaell Cowing called to request refill on Lidocaine cream (before weekend.) He states Dr. Michaell Cowing ordered it previously, but it does not appear on his medication list.  [facility]  Please advise.

## 2013-03-26 ENCOUNTER — Other Ambulatory Visit (INDEPENDENT_AMBULATORY_CARE_PROVIDER_SITE_OTHER): Payer: Self-pay | Admitting: Surgery

## 2013-03-26 ENCOUNTER — Telehealth (INDEPENDENT_AMBULATORY_CARE_PROVIDER_SITE_OTHER): Payer: Self-pay | Admitting: *Deleted

## 2013-03-26 MED ORDER — LIDOCAINE-HYDROCORTISONE ACE 3-2.5 % RE KIT: 1.0000 "application " | PACK | Freq: Four times a day (QID) | RECTAL | Status: DC | PRN

## 2013-03-26 NOTE — Telephone Encounter (Signed)
Called to let patient know that the Anamantel has been sent into his pharmacy.  Patient then states that he only has 2 of his Percocet left and asked if we could get a prescription for that so he can send someone to pick it up.  Patient asking for specifically Percocet.

## 2013-03-26 NOTE — Telephone Encounter (Signed)
Patient called in to state that the prescription called in Friday is not helping at all.  Patient states that what he had been using that helped was the Lidocaine-Hydrocortisone (Anamantel) which Dr. Michaell Cowing prescribed to him on 12/18/12.  Patient is asking for a refill of this medication to help with his internal hems.

## 2013-03-26 NOTE — Telephone Encounter (Signed)
Please advise if okay to refill. 

## 2013-03-26 NOTE — Telephone Encounter (Signed)
eRx sent

## 2013-03-26 NOTE — Telephone Encounter (Signed)
Prescription sent to patient's pharmacy.

## 2013-03-26 NOTE — Progress Notes (Signed)
eRx sent

## 2013-03-27 NOTE — Telephone Encounter (Signed)
Spoke to Gross MD who approved Oxycodone 5mg  1 tablet every 4-6 hours as needed for pain. #50 no refills.  Signed by Dr. Jamey Ripa since Dr. Michaell Cowing is out of the office.  Patient aware prescription is at front desk and will send someone to pick up.

## 2013-04-03 ENCOUNTER — Encounter (INDEPENDENT_AMBULATORY_CARE_PROVIDER_SITE_OTHER): Payer: Medicaid Other | Admitting: Surgery

## 2013-04-05 ENCOUNTER — Telehealth (INDEPENDENT_AMBULATORY_CARE_PROVIDER_SITE_OTHER): Payer: Self-pay

## 2013-04-09 ENCOUNTER — Ambulatory Visit (INDEPENDENT_AMBULATORY_CARE_PROVIDER_SITE_OTHER): Payer: Medicaid Other | Admitting: Surgery

## 2013-04-09 VITALS — BP 118/68 | HR 84 | Temp 97.4°F | Resp 16 | Ht 73.0 in | Wt 234.2 lb

## 2013-04-09 DIAGNOSIS — A630 Anogenital (venereal) warts: Secondary | ICD-10-CM

## 2013-04-09 DIAGNOSIS — K648 Other hemorrhoids: Secondary | ICD-10-CM

## 2013-04-09 MED ORDER — OXYCODONE-ACETAMINOPHEN 5-325 MG PO TABS: 1.0000 | ORAL_TABLET | Freq: Three times a day (TID) | ORAL | Status: DC | PRN

## 2013-04-09 NOTE — Patient Instructions (Addendum)
ANORECTAL SURGERY: POST OP INSTRUCTIONS  1. Take your usually prescribed home medications unless otherwise directed. 2. DIET: Follow a light bland diet the first 24 hours after arrival home, such as soup, liquids, crackers, etc.  Be sure to include lots of fluids daily.  Avoid fast food or heavy meals as your are more likely to get nauseated.  Eat a low fat the next few days after surgery.   3. PAIN CONTROL: a. Pain is best controlled by a usual combination of three different methods TOGETHER: i. Ice/Heat ii. Over the counter pain medication iii. Prescription pain medication b. Most patients will experience some swelling and discomfort in the anus/rectal area. and incisions.  Ice packs or heat (30-60 minutes up to 6 times a day) will help. Use ice for the first few days to help decrease swelling and bruising, then switch to heat such as warm towels, sitz baths, warm baths, etc to help relax tight/sore spots and speed recovery.  Some people prefer to use ice alone, heat alone, alternating between ice & heat.  Experiment to what works for you.  Swelling and bruising can take several weeks to resolve.   c. It is helpful to take an over-the-counter pain medication regularly for the first few weeks.  Choose one of the following that works best for you: i. Naproxen (Aleve, etc)  Two 220mg tabs twice a day ii. Ibuprofen (Advil, etc) Three 200mg tabs four times a day (every meal & bedtime) iii. Acetaminophen (Tylenol, etc) 500-650mg four times a day (every meal & bedtime) d. A  prescription for pain medication (such as oxycodone, hydrocodone, etc) should be given to you upon discharge.  Take your pain medication as prescribed.  i. If you are having problems/concerns with the prescription medicine (does not control pain, nausea, vomiting, rash, itching, etc), please call us [phone] to see if we need to switch you to a different pain medicine that will work better for you and/or control your side effect  better. ii. If you need a refill on your pain medication, please contact your pharmacy.  They will contact our office to request authorization. Prescriptions will not be filled after 5 pm or on week-ends. 4. KEEP YOUR BOWELS REGULAR a. The goal is one bowel movement a day b. Avoid getting constipated.  Between the surgery and the pain medications, it is common to experience some constipation.  Increasing fluid intake and taking a fiber supplement (such as Metamucil, Citrucel, FiberCon, MiraLax, etc) 1-2 times a day regularly will usually help prevent this problem from occurring.  A mild laxative (prune juice, Milk of Magnesia, MiraLax, etc) should be taken according to package directions if there are no bowel movements after 48 hours. c. Watch out for diarrhea.  If you have many loose bowel movements, simplify your diet to bland foods & liquids for a few days.  Stop any stool softeners and decrease your fiber supplement.  Switching to mild anti-diarrheal medications (Kayopectate, Pepto Bismol) can help.  If this worsens or does not improve, please call us.  5. Wound Care a. Remove your bandages the day after surgery.  Unless discharge instructions indicate otherwise, leave your bandage dry and in place overnight.  Remove the bandage during your first bowel movement.   b. Allow the wound packing to fall out over the next few days.  You can trim exposed gauze / ribbon as it falls out.  You do not need to repack the wound unless instructed otherwise.  Wear an   absorbent pad or soft cotton gauze in your underwear as needed to catch any drainage and help keep the area  c. Keep the area clean and dry.  Bathe / shower every day.  Keep the area clean by showering / bathing over the incision / wound.   It is okay to soak an open wound to help wash it.  Wet wipes or showers / gentle washing after bowel movements is often less traumatic than regular toilet paper. d. Bonita Quin may have some styrofoam-like soft packing in  the rectum which will come out with the first bowel movement.  e. You will often notice bleeding with bowel movements.  This should slow down by the end of the first week of surgery f. Expect some drainage.  This should slow down, too, by the end of the first week of surgery.  Wear an absorbent pad or soft cotton gauze in your underwear until the drainage stops. 6. ACTIVITIES as tolerated:   a. You may resume regular (light) daily activities beginning the next day-such as daily self-care, walking, climbing stairs-gradually increasing activities as tolerated.  If you can walk 30 minutes without difficulty, it is safe to try more intense activity such as jogging, treadmill, bicycling, low-impact aerobics, swimming, etc. b. Save the most intensive and strenuous activity for last such as sit-ups, heavy lifting, contact sports, etc  Refrain from any heavy lifting or straining until you are off narcotics for pain control.   c. DO NOT PUSH THROUGH PAIN.  Let pain be your guide: If it hurts to do something, don't do it.  Pain is your body warning you to avoid that activity for another week until the pain goes down. d. You may drive when you are no longer taking prescription pain medication, you can comfortably sit for long periods of time, and you can safely maneuver your car and apply brakes. e. Bonita Quin may have sexual intercourse when it is comfortable.  7. FOLLOW UP in our office a. Please call [facility] at [phone] to set up an appointment to see your surgeon in the office for a follow-up appointment approximately 2 weeks after your surgery. b. Make sure that you call for this appointment the day you arrive home to insure a convenient appointment time. 10. IF YOU HAVE DISABILITY OR FAMILY LEAVE FORMS, BRING THEM TO THE OFFICE FOR PROCESSING.  DO NOT GIVE THEM TO YOUR DOCTOR.        WHEN TO CALL us [phone]: 1. Poor pain control 2. Reactions / problems with new medications (rash/itching, nausea,  etc)  3. Fever over 101.5 F (38.5 C) 4. Inability to urinate 5. Nausea and/or vomiting 6. Worsening swelling or bruising 7. Continued bleeding from incision. 8. Increased pain, redness, or drainage from the incision  The clinic staff is available to answer your questions during regular business hours (8:30am-5pm).  Please don't hesitate to call and ask to speak to one of our nurses for clinical concerns.   A surgeon from [facility] is always on call at the hospitals   If you have a medical emergency, go to the nearest emergency room or call 911.    [facility] [address], [place], [place]  [address] ? MAIN: [phone] ? TOLL FREE: [phone] ? FAX [phone] [email or web address]   HEMORRHOIDS  The rectum is the last foot of your colon, and it naturally stretches to hold stool.  Hemorrhoidal piles are natural clusters of blood vessels  that help the rectum and anal canal stretch to hold stool and allow bowel movements to eliminate feces.   Hemorrhoids are abnormally swollen blood vessels in the rectum.  Too much pressure in the rectum causes hemorrhoids by forcing blood to stretch and bulge the walls of the veins, sometimes even rupturing them.  Hemorrhoids can become like varicose veins you might see on a person's legs.  Most people will develop a flare of hemorrhoids in their lifetime.  When bulging hemorrhoidal veins are irritated, they can swell, burn, itch, cause pain, and bleed.  Most flares will calm down gradually own within a few weeks.  However, once hemorrhoids are created, they are difficult to get rid of completely and tend to flare more easily than the first flare.   Fortunately, good habits and simple medical treatment usually control hemorrhoids well, and surgery is needed only in severe cases. Types of Hemorrhoids:  Internal hemorrhoids usually don't initially hurt or itch; they are deep inside the rectum  and usually have no sensation. If they begin to push out (prolapse), pain and burning can occur.  However, internal hemorrhoids can bleed.  Anal bleeding should not be ignored since bleeding could come from a dangerous source like colorectal cancer, so persistent rectal bleeding should be investigated by a doctor, sometimes with a colonoscopy.  External hemorrhoids cause most of the symptoms - pain, burning, and itching. Nonirritated hemorrhoids can look like small skin tags coming out of the anus.   Thrombosed hemorrhoids can form when a hemorrhoid blood vessel bursts and causes the hemorrhoid to suddenly swell.  A purple blood clot can form in it and become an excruciatingly painful lump at the anus. Because of these unpleasant symptoms, immediate incision and drainage by a surgeon at an office visit can provide much relief of the pain.    PREVENTION Avoiding the most frequent causes listed below will prevent most cases of hemorrhoids: Constipation Hard stools Diarrhea  Constant sitting  Straining with bowel movements Sitting on the toilet for a long time  Severe coughing  episodes Pregnancy / Childbirth  Heavy Lifting  Sometimes avoiding the above triggers is difficult:  How can you avoid sitting all day if you have a seated job? Also, we try to avoid coughing and diarrhea, but sometimes it's beyond your control.  Still, there are some practical hints to help: Keep the anal and genital area clean.  Moistened tissues such as flushable wet wipes are less irritating than toilet paper.  Using irrigating showers or bottle irrigation washing gently cleans this sensitive area.   Avoid dry toilet paper when cleaning after bowel movements.  Marland Kitchen Keep the anal and genital area dry.  Lightly pat the rectal area dry.  Avoid rubbing.  Talcum or baby powders can help GET YOUR STOOLS SOFT.   This is the most important way to prevent irritated hemorrhoids.  Hard stools are like sandpaper to the anorectal canal and  will cause more problems.  The goal: ONE SOFT BOWEL MOVEMENT A DAY!  BMs from every other day to 3 times a day is a tolerable range Treat coughing, diarrhea and constipation early since irritated hemorrhoids may soon follow.  If your main job activity is seated, always stand or walk during your breaks. Make it a point to stand and walk at least 5 minutes every hour and try to shift frequently in your chair to avoid direct rectal pressure.  Always exhale as you strain or lift. Don't hold your breath.  Do not delay or try to prevent a bowel movement when the urge is present. Exercise regularly (walking or jogging 60 minutes a day) to stimulate the bowels to move. No reading or other activity while on the toilet. If bowel movements take longer than 5 minutes, you are too constipated. AVOID CONSTIPATION Drink plenty of liquids (1 1/2 to 2 quarts of water and other fluids a day unless fluid restricted for another medical condition). Liquids that contain caffeine (coffee a, tea, soft drinks) can be dehydrating and should be avoided until constipation is controlled. Consider minimizing milk, as dairy products may be constipating. Eat plenty of fiber (30g a day ideal, more if needed).  Fiber is the undigested part of plant food that passes into the colon, acting as "natures broom" to encourage bowel motility and movement.  Fiber can absorb and hold large amounts of water. This results in a larger, bulkier stool, which is soft and easier to pass.  Eating foods high in fiber - 12 servings - such as  Vegetables: Root (potatoes, carrots, turnips), Leafy green (lettuce, salad greens, celery, spinach), High residue (cabbage, broccoli, etc.) Fruit: Fresh, Dried (prunes, apricots, cherries), Stewed (applesauce)  Whole grain breads, pasta, whole wheat Bran cereals, muffins, etc. Consider adding supplemental bulking fiber which retains large volumes of water: Psyllium ground seeds --available as Metamucil, Konsyl,  Effersyllium, Per Diem Fiber, or the less expensive generic forms.  Citrucel  (methylcellulose wood fiber) . FiberCon (Polycarbophil) Polyethylene Glycol - and "artificial" fiber commonly called Miralax or Glycolax.  It is helpful for people with gassy or bloated feelings with regular fiber Flax Seed - a less gassy natural fiber  Laxatives can be useful for a short period if constipation is severe Osmotics (Milk of Magnesia, Fleets Phospho-Soda, Magnesium Citrate)  Stimulants (Senokot,   Castor Oil,  Dulcolax, Ex-Lax)    Laxatives are not a good long-term solution as it can stress the bowels and cause too much mineral loss and dehydration.   Avoid taking laxatives for more than 7 days in a row.  AVOID DIARRHEA Switch to liquids and simpler foods for a few days to avoid stressing your intestines further. Avoid dairy products (especially milk & ice cream) for a short time.  The intestines often can lose the ability to digest lactose when stressed. Avoid foods that cause gassiness or bloating.  Typical foods include beans and other legumes, cabbage, broccoli, and dairy foods.  Every person has some sensitivity to other foods, so listen to your body and avoid those foods that trigger problems for you. Adding fiber (Citrucel, Metamucil, FiberCon, Flax seed, Miralax) gradually can help thicken stools by absorbing excess fluid and retrain the intestines to act more normally.  Slowly increase the dose over a few weeks.  Too much fiber too soon can backfire and cause cramping & bloating. Probiotics (such as active yogurt, Align, etc) may help repopulate the intestines and colon with normal bacteria and calm down a sensitive digestive tract.  Most studies show it to be of mild help, though, and such products can be costly. Medicines: Bismuth subsalicylate (ex. Kayopectate, Pepto Bismol) every 30 minutes for up to 6 doses can help control diarrhea.  Avoid if pregnant. Loperamide (Immodium) can slow down  diarrhea.  Start with two tablets (4mg  total) first and then try one tablet every 6 hours.  Avoid if you are having fevers or severe pain.  If you are not better or start feeling worse, stop all medicines and call your doctor  for advice Call your doctor if you are getting worse or not better.  Sometimes further testing (cultures, endoscopy, X-ray studies, bloodwork, etc) may be needed to help diagnose and treat the cause of the diarrhea. TREATMENT OF HEMORRHOID FLARE If these preventive measures fail, you must take action right away! Hemorrhoids are one condition that can be mild in the morning and become intolerable by nightfall. Most hemorrhoidal flares take several weeks to calm down.  These suggestions can help: Warm soaks.  This helps more than any topical medication.  Use up to 8 times a day.  Usually sitz baths or sitting in a warm bathtub helps.  Sitting on moist warm towels are helpful.  Switching to ice packs/cool compresses can be helpful Normalize your bowels.  Extremes of diarrhea or constipation will make hemorrhoids worse.  One soft bowel movement a day is the goal.  Fiber can help get your bowels regular Wet wipes instead of toilet paper Pain control with a NSAID such as ibuprofen (Advil) or naproxen (Aleve) or acetaminophen (Tylenol) around the clock.  Narcotics are constipating and should be minimized if possible Topical creams contain steroids (bydrocortisone) or local anesthetic (xylocaine) can help make pain and itching more tolerable.   EVALUATION If hemorrhoids are still causing problems, you could benefit by an evaluation by a surgeon.  The surgeon will obtain a history and examine you.  If hemorrhoids are diagnosed, some therapies can be offered in the office, usually with an anoscope into the less sensitive area of the rectum: -injection of hemorrhoids (sclerotherapy) can scar the blood vessels of the swollen/enlarged hemorrhoids to help shrink them down to a more normal  size -rubber banding of the enlarged hemorrhoids to help shrink them down to a more normal size -drainage of the blood clot causing a thrombosed hemorrhoid,  to relieve the severe pain   While 90% of the time such problems from hemorrhoids can be managed without preceding to surgery, sometimes the hemorrhoids require a operation to control the problem (uncontrolled bleeding, prolapse, pain, etc.).   This involves being placed under general anesthesia where the surgeon can confirm the diagnosis and remove, suture, or staple the hemorrhoid(s).  Your surgeon can help you treat the problem appropriately.    Genital Warts Genital warts are a sexually transmitted infection. They may appear as small bumps on the tissues of the genital area. CAUSES  Genital warts are caused by a virus called human papillomavirus (HPV). HPV is the most common sexually transmitted disease (STD) and infection of the sex organs. This infection is spread by having unprotected sex with an infected person. It can be spread by vaginal, anal, and oral sex. Many people do not know they are infected. They may be infected for years without problems. However, even if they do not have problems, they can unknowingly pass the infection to their sexual partners. SYMPTOMS   Itching and irritation in the genital area.  Warts that bleed.  Painful sexual intercourse. DIAGNOSIS  Warts are usually recognized with the naked eye on the vagina, vulva, perineum, anus, and rectum. Certain tests can also diagnose genital warts, such as:  A Pap test.  A tissue sample (biopsy) exam.  Colposcopy. A magnifying tool is used to examine the vagina and cervix. The HPV cells will change color when certain solutions are used. TREATMENT  Warts can be removed by:  Applying certain chemicals, such as cantharidin or podophyllin.  Liquid nitrogen freezing (cryotherapy).  Immunotherapy with candida or trichophyton injections.  Laser  treatment.  Burning with an electrified probe (electrocautery).  Interferon injections.  Surgery. PREVENTION  HPV vaccination can help prevent HPV infections that cause genital warts and that cause cancer of the cervix. It is recommended that the vaccination be given to people between the ages 59 to 38 years old. The vaccine might not work as well or might not work at all if you already have HPV. It should not be given to pregnant women. HOME CARE INSTRUCTIONS   It is important to follow your caregiver's instructions. The warts will not go away without treatment. Repeat treatments are often needed to get rid of warts. Even after it appears that the warts are gone, the normal tissue underneath often remains infected.  Do not try to treat genital warts with medicine used to treat hand warts. This type of medicine is strong and can burn the skin in the genital area, causing more damage.  Tell your past and current sexual partner(s) that you have genital warts. They may be infected also and need treatment.  Avoid sexual contact while being treated.  Do not touch or scratch the warts. The infection may spread to other parts of your body.  Women with genital warts should have a cervical cancer check (Pap test) at least once a year. This type of cancer is slow-growing and can be cured if found early. Chances of developing cervical cancer are increased with HPV.  Inform your obstetrician about your warts in the event of pregnancy. This virus can be passed to the baby's respiratory tract. Discuss this with your caregiver.  Use a condom during sexual intercourse. Following treatment, the use of condoms will help prevent reinfection.  Ask your caregiver about using over-the-counter anti-itch creams. SEEK MEDICAL CARE IF:   Your treated skin becomes red, swollen, or painful.  You have a fever.  You feel generally ill.  You feel little lumps in and around your genital area.  You are bleeding  or have painful sexual intercourse. MAKE SURE YOU:   Understand these instructions.  Will watch your condition.  Will get help right away if you are not doing well or get worse. Document Released: 10/08/2000 Document Revised: 01/03/2012 Document Reviewed: 04/19/2011 [facility] Patient Information 2014 [place], [place].

## 2013-04-09 NOTE — Progress Notes (Signed)
Subjective:     Patient ID: Benjamin Herrera, male   DOB: 10-07-1966, 47 y.o.   MRN: [id number]  HPI  Benjamin Herrera  1966-02-22 [id number]  Patient Care Team: [name], MD as PCP - General (Infectious Diseases) [name], MD as PCP - Infectious Diseases (Infectious Diseases) [name], MD as Consulting Physician (Gastroenterology) [name], MD as Consulting Physician (General Surgery)  This patient is a 47 y.o.male who presents today for surgical evaluation Status post left lateral hemorrhoidectomy, removal of left lateral perianal condylomata, THD hemorrhoidal ligation and pexy 03/16/2013  The patient comes in today feeling sore but better.  Bleeding faded away.  Daily bowel movements.  Still swelling around the anus that concerns him.  No fevers or chills.  Energy level okay.  Eating okay.Topical hydrocortisone/lidocaine working well for him.  Claims oxycodone only upsets his stomach.  Prefers Percocet.  Feels that coating is better on his stomach.  Patient Active Problem List   Diagnosis Date Noted  . 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  . Numbness and tingling in left hand 02/12/2013  . Myalgia 02/12/2013  . Internal hemorrhoids with bleeding 01/30/2013  . Renal failure, unspecified 08/30/2012  . 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 Condyloma acuminatum in male of scrotum & anal canal s/p laser ablation 07/03/2012  . Abdominal pain, left lower quadrant 01/20/2011  . ABDOMINAL PAIN, EPIGASTRIC 09/25/2010  . HYPERTENSION 08/31/2010  . PSORIASIS 11/20/2009  . Achalasia and cardiospasm 08/14/2009  . OTHER CANDIDIASIS OF OTHER SPECIFIED SITES 06/26/2009  . CANDIDIASIS OF MOUTH 05/16/2008  . SUBSTANCE ABUSE, MULTIPLE 05/16/2008  . ASTHMA, UNSPECIFIED, UNSPECIFIED STATUS 05/16/2008  . HIV DISEASE 09/03/2006  . FRACTURE, HUMERUS 09/03/2006    Past Medical History  Diagnosis Date  . HIV (human  immunodeficiency virus infection)   . Polysubstance abuse   . Boils     under arms  . Hemorrhoids   . Herpes labialis   . Psoriasis   . Achalasia and cardiospasm   . Candidiasis of unspecified site   . Dysphagia   . Asthma   . Candidiasis of mouth   . Fracture, humerus   . Hepatitis 1993    A  . 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 Pilonidal disease 01/10/2012  . Condyloma acuminatum in male of scrotum & anal canal s/p laser ablation 07/03/2012  . 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 Humeral fracture surgery    . Right shoulder replacement  2008    x 2  . Esophagogastroduodenoscopy  11/11/2011    Procedure: ESOPHAGOGASTRODUODENOSCOPY (EGD);  Surgeon: [name], MD;  Location: [facility];  Service: Endoscopy;  Laterality: N/A;  botox injection  called Pt to change time of procedure per Dr Arlyce Dice  . Esophagogastroduodenoscopy  03/10/2012    Procedure: ESOPHAGOGASTRODUODENOSCOPY (EGD);  Surgeon: [name], MD;  Location: [facility];  Service: Endoscopy;  Laterality: N/A;  . Colonoscopy  05/19/2012    Procedure: COLONOSCOPY;  Surgeon: [name], MD;  Location: [facility];  Service: Endoscopy;  Laterality: N/A;  jill trying to contact pt to come in 0830 for 930 case, phone not accepting messages  . Esophageal manometry  05/29/2012    Procedure: ESOPHAGEAL MANOMETRY (EM);  Surgeon: [name], MD;  Location: [facility];  Service: Endoscopy;  Laterality: N/A;  . Pilonidal cyst excision  07/24/2012    Procedure: CYST EXCISION PILONIDAL SIMPLE;  Surgeon: [name], MD;  Location: [facility];  Service: General;  Laterality: N/A;  Exam Under Anesthesia,, Excision Pilonidal Disease,   . Examination under anesthesia  07/24/2012    Procedure: EXAM UNDER ANESTHESIA;  Surgeon: [name], MD;  Location: [facility];  Service:  General;  Laterality: N/A;  . Wart fulguration  07/24/2012    Procedure: FULGURATION ANAL WART;  Surgeon: [name], MD;  Location: [facility];  Service: General;  Laterality: N/A;  excision of raphe mass  . Esophagogastroduodenoscopy  10/11/2012    Procedure: ESOPHAGOGASTRODUODENOSCOPY (EGD);  Surgeon: [name], MD;  Location: [facility];  Service: Endoscopy;  Laterality: N/A;  . Botox injection  10/11/2012    Procedure: BOTOX INJECTION;  Surgeon: [name], MD;  Location: [facility];  Service: Endoscopy;  Laterality: N/A;  . Esophagogastroduodenoscopy N/A 01/25/2013    Procedure: ESOPHAGOGASTRODUODENOSCOPY (EGD);  Surgeon: [name], MD;  Location: [facility];  Service: Endoscopy;  Laterality: N/A;  . Botox injection N/A 01/25/2013    Procedure: BOTOX INJECTION;  Surgeon: [name], MD;  Location: [facility];  Service: Endoscopy;  Laterality: N/A;    History   Social History  . Marital Status: Single    Spouse Name: N/A    Number of Children: N/A  . Years of Education: N/A   Occupational History  . Not on file.   Social History Main Topics  . Smoking status: Current Every Day Smoker -- 0.50 packs/day for 25 years    Types: Cigarettes  . Smokeless tobacco: Never Used  . Alcohol Use: No  . Drug Use: 7.00 per week    Special: Marijuana     Comment: pt now denies cocaine use   . Sexually Active: Yes -- Male partner(s)     Comment: pt. given condoms   Other Topics Concern  . Not on file   Social History Narrative  . No narrative on file    Family History  Problem Relation Age of Onset  . Colon cancer Neg Hx   . Stomach cancer Maternal Grandmother     ?  . Diabetes Maternal Grandmother     Current Outpatient Prescriptions  Medication Sig Dispense Refill  . lamiVUDine-zidovudine (COMBIVIR) 150-300 MG per tablet Take 1 tablet by mouth daily.      Marland Kitchen lidocaine (XYLOCAINE JELLY) 2 % jelly Apply to affected area daily prn  30 mL  1  .  lidocaine-hydrocortisone (ANAMANTEL HC) 3-0.5 % CREA USE AS DIRECTED  98 g  0  . Lidocaine-Hydrocortisone Ace (ANAMANTLE HC) 3-2.5 % KIT Apply 1 application topically 4 (four) times daily as needed (for anal pain / irritation).  1 each  5  . oxyCODONE-acetaminophen (PERCOCET/ROXICET) 5-325 MG per tablet Take 1 tablet by mouth every 4 (four) hours as needed for pain.       No current facility-administered medications for this visit.     No Known Allergies  BP 118/68  Pulse 84  Temp(Src) 97.4 F (36.3 C) (Temporal)  Resp 16  Ht 6\' 1"  (1.854 m)  Wt 234 lb 3.2 oz (106.232 kg)  BMI 30.91 kg/m2  No results found.   Review of Systems  Constitutional: Negative for fever, chills and diaphoresis.  HENT: Negative for sore throat, trouble swallowing and neck pain.   Eyes: Negative for photophobia and visual disturbance.  Respiratory: Negative  for choking and shortness of breath.   Cardiovascular: Negative for chest pain and palpitations.  Gastrointestinal: Negative for nausea, vomiting, abdominal distention, anal bleeding and rectal pain.  Genitourinary: Negative for dysuria, urgency, difficulty urinating and testicular pain.  Musculoskeletal: Negative for myalgias, arthralgias and gait problem.  Skin: Negative for color change and rash.  Neurological: Negative for dizziness, speech difficulty, weakness and numbness.  Hematological: Negative for adenopathy.  Psychiatric/Behavioral: Negative for hallucinations, confusion and agitation.       Objective:   Physical Exam  Constitutional: He is oriented to person, place, and time. He appears well-developed and well-nourished. No distress.  HENT:  Head: Normocephalic.  Mouth/Throat: Oropharynx is clear and moist. No oropharyngeal exudate.  Eyes: Conjunctivae and EOM are normal. Pupils are equal, round, and reactive to light. No scleral icterus.  Neck: Normal range of motion. No tracheal deviation present.  Cardiovascular: Normal rate,  normal heart sounds and intact distal pulses.   Pulmonary/Chest: Effort normal. No respiratory distress.  Abdominal: Soft. He exhibits no distension. There is no tenderness. Hernia confirmed negative in the right inguinal area and confirmed negative in the left inguinal area.  Incisions clean with normal healing ridges.  No hernias  Genitourinary:     Musculoskeletal: Normal range of motion. He exhibits no tenderness.  Neurological: He is alert and oriented to person, place, and time. No cranial nerve deficit. He exhibits normal muscle tone. Coordination normal.  Skin: Skin is warm and dry. No rash noted. He is not diaphoretic.  Psychiatric: He has a normal mood and affect. His behavior is normal.       Assessment:     Gradually recovering 3 weeks out from [facility] hemorrhoidal ligation/pexy, external hemorrhoidectomy, removal of condylomata    Plan:     Increase activity as tolerated to regular activity.  Low impact exercise such as walking an hour a day at least ideal.  Do not push through pain.  I will renew his Percocet since this is better tolerated than oxycodone only (?!?) - last time though.  Continue the AnaMantle topical cream as needed for pain control as well.  Diet as tolerated.  Low fat high fiber diet ideal.  Bowel regimen with 30 g fiber a day and fiber supplement as needed to avoid problems.  Return to clinic 3 more weeks, sooner as needed.   Instructions discussed.  Followup with primary care physician for other health issues as would normally be done.  Questions answered.  The patient expressed understanding and appreciation

## 2013-04-25 ENCOUNTER — Ambulatory Visit (INDEPENDENT_AMBULATORY_CARE_PROVIDER_SITE_OTHER): Payer: Medicaid Other | Admitting: Surgery

## 2013-04-25 ENCOUNTER — Encounter (INDEPENDENT_AMBULATORY_CARE_PROVIDER_SITE_OTHER): Payer: Self-pay | Admitting: Surgery

## 2013-04-25 VITALS — BP 120/76 | HR 84 | Temp 97.3°F | Resp 16 | Ht 73.5 in | Wt 235.2 lb

## 2013-04-25 DIAGNOSIS — D0760 Carcinoma in situ of unspecified male genital organs: Secondary | ICD-10-CM

## 2013-04-25 DIAGNOSIS — K648 Other hemorrhoids: Secondary | ICD-10-CM

## 2013-04-25 DIAGNOSIS — D0761 Carcinoma in situ of scrotum: Secondary | ICD-10-CM

## 2013-04-25 DIAGNOSIS — A630 Anogenital (venereal) warts: Secondary | ICD-10-CM

## 2013-04-25 NOTE — Progress Notes (Signed)
Subjective:     Patient ID: Benjamin Herrera, male   DOB: November 24, 1965, 47 y.o.   MRN: [id number]  HPI   Benjamin Herrera  08-Nov-1965 [id number]  Patient Care Team: [name], MD as PCP - General (Infectious Diseases) [name], MD as PCP - Infectious Diseases (Infectious Diseases) [name], MD as Consulting Physician (Gastroenterology) [name], MD as Consulting Physician (General Surgery)  This patient is a 47 y.o.male who presents today for surgical evaluation Status post left lateral hemorrhoidectomy, removal of left lateral perianal condylomata, THD hemorrhoidal ligation and pexy 03/16/2013  Diagnosis Anus, biopsy, left lateral mass - FINDINGS CONSISTENT WITH CONDYLOMA. - NO MALIGNANCY IDENTIFIED. [name] M.D. Ph.D. Pathologist, Electronic Signature (Case signed 03/21/2013)  The patient comes in today feeling better.  No more bleeding..  Daily bowel movements.  Less swelling around the anus but still concerns him.  No fevers or chills.  Energy level down a little.  Eating okay.  Feels some soreness at his bellybutton.    Patient Active Problem List   Diagnosis Date Noted  . 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  . Numbness and tingling in left hand 02/12/2013  . Myalgia 02/12/2013  . Internal hemorrhoids with bleeding 01/30/2013  . Renal failure, unspecified 08/30/2012  . 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 Condyloma acuminatum in male of scrotum & anal canal s/p laser ablation 07/03/2012  . Abdominal pain, left lower quadrant 01/20/2011  . ABDOMINAL PAIN, EPIGASTRIC 09/25/2010  . HYPERTENSION 08/31/2010  . PSORIASIS 11/20/2009  . Achalasia and cardiospasm 08/14/2009  . OTHER CANDIDIASIS OF OTHER SPECIFIED SITES 06/26/2009  . CANDIDIASIS OF MOUTH 05/16/2008  . SUBSTANCE ABUSE, MULTIPLE 05/16/2008  . ASTHMA, UNSPECIFIED, UNSPECIFIED STATUS 05/16/2008  . HIV DISEASE 09/03/2006  . FRACTURE,  HUMERUS 09/03/2006    Past Medical History  Diagnosis Date  . HIV (human immunodeficiency virus infection)   . Polysubstance abuse   . Boils     under arms  . Hemorrhoids   . Herpes labialis   . Psoriasis   . Achalasia and cardiospasm   . Candidiasis of unspecified site   . Dysphagia   . Asthma   . Candidiasis of mouth   . Fracture, humerus   . Hepatitis 1993    A  . 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 Pilonidal disease 01/10/2012  . Condyloma acuminatum in male of scrotum & anal canal s/p laser ablation 07/03/2012  . 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 Humeral fracture surgery    . Right shoulder replacement  2008    x 2  . Esophagogastroduodenoscopy  11/11/2011    Procedure: ESOPHAGOGASTRODUODENOSCOPY (EGD);  Surgeon: [name], MD;  Location: [facility];  Service: Endoscopy;  Laterality: N/A;  botox injection  called Pt to change time of procedure per Dr Arlyce Dice  . Esophagogastroduodenoscopy  03/10/2012    Procedure: ESOPHAGOGASTRODUODENOSCOPY (EGD);  Surgeon: [name], MD;  Location: [facility];  Service: Endoscopy;  Laterality: N/A;  . Colonoscopy  05/19/2012    Procedure: COLONOSCOPY;  Surgeon: [name], MD;  Location: [facility];  Service: Endoscopy;  Laterality: N/A;  jill trying to contact pt to come in 0830 for 930 case, phone not accepting messages  . Esophageal manometry  05/29/2012    Procedure:  ESOPHAGEAL MANOMETRY (EM);  Surgeon: [name], MD;  Location: [facility];  Service: Endoscopy;  Laterality: N/A;  . Pilonidal cyst excision  07/24/2012    Procedure: CYST EXCISION PILONIDAL SIMPLE;  Surgeon: [name], MD;  Location: [facility];  Service: General;  Laterality: N/A;  Exam Under Anesthesia,, Excision Pilonidal Disease,   . Examination under anesthesia  07/24/2012    Procedure: EXAM UNDER  ANESTHESIA;  Surgeon: [name], MD;  Location: [facility];  Service: General;  Laterality: N/A;  . Wart fulguration  07/24/2012    Procedure: FULGURATION ANAL WART;  Surgeon: [name], MD;  Location: [facility];  Service: General;  Laterality: N/A;  excision of raphe mass  . Esophagogastroduodenoscopy  10/11/2012    Procedure: ESOPHAGOGASTRODUODENOSCOPY (EGD);  Surgeon: [name], MD;  Location: [facility];  Service: Endoscopy;  Laterality: N/A;  . Botox injection  10/11/2012    Procedure: BOTOX INJECTION;  Surgeon: [name], MD;  Location: [facility];  Service: Endoscopy;  Laterality: N/A;  . Esophagogastroduodenoscopy N/A 01/25/2013    Procedure: ESOPHAGOGASTRODUODENOSCOPY (EGD);  Surgeon: [name], MD;  Location: [facility];  Service: Endoscopy;  Laterality: N/A;  . Botox injection N/A 01/25/2013    Procedure: BOTOX INJECTION;  Surgeon: [name], MD;  Location: [facility];  Service: Endoscopy;  Laterality: N/A;    History   Social History  . Marital Status: Single    Spouse Name: N/A    Number of Children: N/A  . Years of Education: N/A   Occupational History  . Not on file.   Social History Main Topics  . Smoking status: Current Every Day Smoker -- 0.50 packs/day for 25 years    Types: Cigarettes  . Smokeless tobacco: Never Used  . Alcohol Use: No  . Drug Use: 7.00 per week    Special: Marijuana     Comment: pt now denies cocaine use   . Sexually Active: Yes -- Male partner(s)     Comment: pt. given condoms   Other Topics Concern  . Not on file   Social History Narrative  . No narrative on file    Family History  Problem Relation Age of Onset  . Colon cancer Neg Hx   . Stomach cancer Maternal Grandmother     ?  . Diabetes Maternal Grandmother     Current Outpatient Prescriptions  Medication Sig Dispense Refill  . lamiVUDine-zidovudine (COMBIVIR) 150-300 MG per tablet Take 1 tablet by mouth daily.      Marland Kitchen lidocaine (XYLOCAINE  JELLY) 2 % jelly Apply to affected area daily prn  30 mL  1  . lidocaine-hydrocortisone (ANAMANTEL HC) 3-0.5 % CREA USE AS DIRECTED  98 g  0  . Lidocaine-Hydrocortisone Ace (ANAMANTLE HC) 3-2.5 % KIT Apply 1 application topically 4 (four) times daily as needed (for anal pain / irritation).  1 each  5  . oxyCODONE-acetaminophen (PERCOCET/ROXICET) 5-325 MG per tablet Take 1-2 tablets by mouth every 8 (eight) hours as needed for pain.  40 tablet  0   No current facility-administered medications for this visit.     No Known Allergies  BP 120/76  Pulse 84  Temp(Src) 97.3 F (36.3 C)  Resp 16  Ht 6' 1.5" (1.867 m)  Wt 235 lb 3.2 oz (106.686 kg)  BMI 30.61 kg/m2  No results found.   Review of Systems  Constitutional: Positive for fatigue. Negative for fever, chills, diaphoresis, activity change, appetite change and unexpected weight  change.  HENT: Negative for sore throat, trouble swallowing and neck pain.   Eyes: Negative for photophobia and visual disturbance.  Respiratory: Negative for choking and shortness of breath.   Cardiovascular: Negative for chest pain and palpitations.  Gastrointestinal: Negative for nausea, vomiting, diarrhea, constipation, blood in stool, abdominal distention, anal bleeding and rectal pain.  Genitourinary: Negative for dysuria, urgency, difficulty urinating and testicular pain.  Musculoskeletal: Negative for myalgias, arthralgias and gait problem.  Skin: Negative for color change and rash.  Neurological: Negative for dizziness, speech difficulty, weakness and numbness.  Hematological: Negative for adenopathy.  Psychiatric/Behavioral: Negative for hallucinations, confusion and agitation.       Objective:   Physical Exam  Constitutional: He is oriented to person, place, and time. He appears well-developed and well-nourished. No distress.  HENT:  Head: Normocephalic.  Mouth/Throat: Oropharynx is clear and moist. No oropharyngeal exudate.  Eyes:  Conjunctivae and EOM are normal. Pupils are equal, round, and reactive to light. No scleral icterus.  Neck: Normal range of motion. No tracheal deviation present.  Cardiovascular: Normal rate, normal heart sounds and intact distal pulses.   Pulmonary/Chest: Effort normal. No respiratory distress.  Abdominal: Soft. Bowel sounds are normal. He exhibits no distension. There is no tenderness. There is no rigidity, no rebound, no guarding, no CVA tenderness, no tenderness at McBurney's point and negative Murphy's sign. No hernia. Hernia confirmed negative in the ventral area, confirmed negative in the right inguinal area and confirmed negative in the left inguinal area.    Incisions clean with normal healing ridges.  No hernias  Genitourinary:     Musculoskeletal: Normal range of motion. He exhibits no tenderness.  Neurological: He is alert and oriented to person, place, and time. No cranial nerve deficit. He exhibits normal muscle tone. Coordination normal.  Skin: Skin is warm and dry. No rash noted. He is not diaphoretic.  Psychiatric: He has a normal mood and affect. His behavior is normal.       Assessment:     Gradually recovering 6 weeks out from [facility] hemorrhoidal ligation/pexy, external hemorrhoidectomy, removal of condylomata    Plan:     Increase activity as tolerated to regular activity.  Low impact exercise such as walking an hour a day at least ideal.  Do not push through pain.  Diet as tolerated.  Low fat high fiber diet ideal.  Bowel regimen with 30 g fiber a day and fiber supplement as needed to avoid problems.  Return to clinic 4 months for condyloma check, sooner as needed.   Instructions discussed.  Followup with primary care physician for other health issues as would normally be done.  Questions answered.  The patient expressed understanding and appreciation

## 2013-04-25 NOTE — Patient Instructions (Addendum)
HEMORRHOIDS  The rectum is the last foot of your colon, and it naturally stretches to hold stool.  Hemorrhoidal piles are natural clusters of blood vessels that help the rectum and anal canal stretch to hold stool and allow bowel movements to eliminate feces.   Hemorrhoids are abnormally swollen blood vessels in the rectum.  Too much pressure in the rectum causes hemorrhoids by forcing blood to stretch and bulge the walls of the veins, sometimes even rupturing them.  Hemorrhoids can become like varicose veins you might see on a person's legs.  Most people will develop a flare of hemorrhoids in their lifetime.  When bulging hemorrhoidal veins are irritated, they can swell, burn, itch, cause pain, and bleed.  Most flares will calm down gradually own within a few weeks.  However, once hemorrhoids are created, they are difficult to get rid of completely and tend to flare more easily than the first flare.   Fortunately, good habits and simple medical treatment usually control hemorrhoids well, and surgery is needed only in severe cases. Types of Hemorrhoids:  Internal hemorrhoids usually don't initially hurt or itch; they are deep inside the rectum and usually have no sensation. If they begin to push out (prolapse), pain and burning can occur.  However, internal hemorrhoids can bleed.  Anal bleeding should not be ignored since bleeding could come from a dangerous source like colorectal cancer, so persistent rectal bleeding should be investigated by a doctor, sometimes with a colonoscopy.  External hemorrhoids cause most of the symptoms - pain, burning, and itching. Nonirritated hemorrhoids can look like small skin tags coming out of the anus.   Thrombosed hemorrhoids can form when a hemorrhoid blood vessel bursts and causes the hemorrhoid to suddenly swell.  A purple blood clot can form in it and become an excruciatingly painful lump at the anus. Because of these unpleasant symptoms, immediate incision and  drainage by a surgeon at an office visit can provide much relief of the pain.    PREVENTION Avoiding the most frequent causes listed below will prevent most cases of hemorrhoids: Constipation Hard stools Diarrhea  Constant sitting  Straining with bowel movements Sitting on the toilet for a long time  Severe coughing  episodes Pregnancy / Childbirth  Heavy Lifting  Sometimes avoiding the above triggers is difficult:  How can you avoid sitting all day if you have a seated job? Also, we try to avoid coughing and diarrhea, but sometimes it's beyond your control.  Still, there are some practical hints to help: Keep the anal and genital area clean.  Moistened tissues such as flushable wet wipes are less irritating than toilet paper.  Using irrigating showers or bottle irrigation washing gently cleans this sensitive area.   Avoid dry toilet paper when cleaning after bowel movements.  Marland Kitchen Keep the anal and genital area dry.  Lightly pat the rectal area dry.  Avoid rubbing.  Talcum or baby powders can help GET YOUR STOOLS SOFT.   This is the most important way to prevent irritated hemorrhoids.  Hard stools are like sandpaper to the anorectal canal and will cause more problems.  The goal: ONE SOFT BOWEL MOVEMENT A DAY!  BMs from every other day to 3 times a day is a tolerable range Treat coughing, diarrhea and constipation early since irritated hemorrhoids may soon follow.  If your main job activity is seated, always stand or walk during your breaks. Make it a point to stand and walk at least 5 minutes every hour  and try to shift frequently in your chair to avoid direct rectal pressure.  Always exhale as you strain or lift. Don't hold your breath.  Do not delay or try to prevent a bowel movement when the urge is present. Exercise regularly (walking or jogging 60 minutes a day) to stimulate the bowels to move. No reading or other activity while on the toilet. If bowel movements take longer than 5 minutes,  you are too constipated. AVOID CONSTIPATION Drink plenty of liquids (1 1/2 to 2 quarts of water and other fluids a day unless fluid restricted for another medical condition). Liquids that contain caffeine (coffee a, tea, soft drinks) can be dehydrating and should be avoided until constipation is controlled. Consider minimizing milk, as dairy products may be constipating. Eat plenty of fiber (30g a day ideal, more if needed).  Fiber is the undigested part of plant food that passes into the colon, acting as "natures broom" to encourage bowel motility and movement.  Fiber can absorb and hold large amounts of water. This results in a larger, bulkier stool, which is soft and easier to pass.  Eating foods high in fiber - 12 servings - such as  Vegetables: Root (potatoes, carrots, turnips), Leafy green (lettuce, salad greens, celery, spinach), High residue (cabbage, broccoli, etc.) Fruit: Fresh, Dried (prunes, apricots, cherries), Stewed (applesauce)  Whole grain breads, pasta, whole wheat Bran cereals, muffins, etc. Consider adding supplemental bulking fiber which retains large volumes of water: Psyllium ground seeds --available as Metamucil, Konsyl, Effersyllium, Per Diem Fiber, or the less expensive generic forms.  Citrucel  (methylcellulose wood fiber) . FiberCon (Polycarbophil) Polyethylene Glycol - and "artificial" fiber commonly called Miralax or Glycolax.  It is helpful for people with gassy or bloated feelings with regular fiber Flax Seed - a less gassy natural fiber  Laxatives can be useful for a short period if constipation is severe Osmotics (Milk of Magnesia, Fleets Phospho-Soda, Magnesium Citrate)  Stimulants (Senokot,   Castor Oil,  Dulcolax, Ex-Lax)    Laxatives are not a good long-term solution as it can stress the bowels and cause too much mineral loss and dehydration.   Avoid taking laxatives for more than 7 days in a row.  AVOID DIARRHEA Switch to liquids and simpler foods for a few  days to avoid stressing your intestines further. Avoid dairy products (especially milk & ice cream) for a short time.  The intestines often can lose the ability to digest lactose when stressed. Avoid foods that cause gassiness or bloating.  Typical foods include beans and other legumes, cabbage, broccoli, and dairy foods.  Every person has some sensitivity to other foods, so listen to your body and avoid those foods that trigger problems for you. Adding fiber (Citrucel, Metamucil, FiberCon, Flax seed, Miralax) gradually can help thicken stools by absorbing excess fluid and retrain the intestines to act more normally.  Slowly increase the dose over a few weeks.  Too much fiber too soon can backfire and cause cramping & bloating. Probiotics (such as active yogurt, Align, etc) may help repopulate the intestines and colon with normal bacteria and calm down a sensitive digestive tract.  Most studies show it to be of mild help, though, and such products can be costly. Medicines: Bismuth subsalicylate (ex. Kayopectate, Pepto Bismol) every 30 minutes for up to 6 doses can help control diarrhea.  Avoid if pregnant. Loperamide (Immodium) can slow down diarrhea.  Start with two tablets (48m total) first and then try one tablet every 6 hours.  Avoid if you are having fevers or severe pain.  If you are not better or start feeling worse, stop all medicines and call your doctor for advice Call your doctor if you are getting worse or not better.  Sometimes further testing (cultures, endoscopy, X-ray studies, bloodwork, etc) may be needed to help diagnose and treat the cause of the diarrhea. TREATMENT OF HEMORRHOID FLARE If these preventive measures fail, you must take action right away! Hemorrhoids are one condition that can be mild in the morning and become intolerable by nightfall. Most hemorrhoidal flares take several weeks to calm down.  These suggestions can help: Warm soaks.  This helps more than any topical  medication.  Use up to 8 times a day.  Usually sitz baths or sitting in a warm bathtub helps.  Sitting on moist warm towels are helpful.  Switching to ice packs/cool compresses can be helpful Normalize your bowels.  Extremes of diarrhea or constipation will make hemorrhoids worse.  One soft bowel movement a day is the goal.  Fiber can help get your bowels regular Wet wipes instead of toilet paper Pain control with a NSAID such as ibuprofen (Advil) or naproxen (Aleve) or acetaminophen (Tylenol) around the clock.  Narcotics are constipating and should be minimized if possible Topical creams contain steroids (bydrocortisone) or local anesthetic (xylocaine) can help make pain and itching more tolerable.   EVALUATION If hemorrhoids are still causing problems, you could benefit by an evaluation by a surgeon.  The surgeon will obtain a history and examine you.  If hemorrhoids are diagnosed, some therapies can be offered in the office, usually with an anoscope into the less sensitive area of the rectum: -injection of hemorrhoids (sclerotherapy) can scar the blood vessels of the swollen/enlarged hemorrhoids to help shrink them down to a more normal size -rubber banding of the enlarged hemorrhoids to help shrink them down to a more normal size -drainage of the blood clot causing a thrombosed hemorrhoid,  to relieve the severe pain   While 90% of the time such problems from hemorrhoids can be managed without preceding to surgery, sometimes the hemorrhoids require a operation to control the problem (uncontrolled bleeding, prolapse, pain, etc.).   This involves being placed under general anesthesia where the surgeon can confirm the diagnosis and remove, suture, or staple the hemorrhoid(s).  Your surgeon can help you treat the problem appropriately.    Genital Warts Genital warts are a sexually transmitted infection. They may appear as small bumps on the tissues of the genital area. CAUSES  Genital warts are  caused by a virus called human papillomavirus (HPV). HPV is the most common sexually transmitted disease (STD) and infection of the sex organs. This infection is spread by having unprotected sex with an infected person. It can be spread by vaginal, anal, and oral sex. Many people do not know they are infected. They may be infected for years without problems. However, even if they do not have problems, they can unknowingly pass the infection to their sexual partners. SYMPTOMS   Itching and irritation in the genital area.  Warts that bleed.  Painful sexual intercourse. DIAGNOSIS  Warts are usually recognized with the naked eye on the vagina, vulva, perineum, anus, and rectum. Certain tests can also diagnose genital warts, such as:  A Pap test.  A tissue sample (biopsy) exam.  Colposcopy. A magnifying tool is used to examine the vagina and cervix. The HPV cells will change color when certain solutions are used. TREATMENT  Warts can be removed by:  Applying certain chemicals, such as cantharidin or podophyllin.  Liquid nitrogen freezing (cryotherapy).  Immunotherapy with candida or trichophyton injections.  Laser treatment.  Burning with an electrified probe (electrocautery).  Interferon injections.  Surgery. PREVENTION  HPV vaccination can help prevent HPV infections that cause genital warts and that cause cancer of the cervix. It is recommended that the vaccination be given to people between the ages 52 to 85 years old. The vaccine might not work as well or might not work at all if you already have HPV. It should not be given to pregnant women. HOME CARE INSTRUCTIONS   It is important to follow your caregiver's instructions. The warts will not go away without treatment. Repeat treatments are often needed to get rid of warts. Even after it appears that the warts are gone, the normal tissue underneath often remains infected.  Do not try to treat genital warts with medicine used to  treat hand warts. This type of medicine is strong and can burn the skin in the genital area, causing more damage.  Tell your past and current sexual partner(s) that you have genital warts. They may be infected also and need treatment.  Avoid sexual contact while being treated.  Do not touch or scratch the warts. The infection may spread to other parts of your body.  Women with genital warts should have a cervical cancer check (Pap test) at least once a year. This type of cancer is slow-growing and can be cured if found early. Chances of developing cervical cancer are increased with HPV.  Inform your obstetrician about your warts in the event of pregnancy. This virus can be passed to the baby's respiratory tract. Discuss this with your caregiver.  Use a condom during sexual intercourse. Following treatment, the use of condoms will help prevent reinfection.  Ask your caregiver about using over-the-counter anti-itch creams. SEEK MEDICAL CARE IF:   Your treated skin becomes red, swollen, or painful.  You have a fever.  You feel generally ill.  You feel little lumps in and around your genital area.  You are bleeding or have painful sexual intercourse. MAKE SURE YOU:   Understand these instructions.  Will watch your condition.  Will get help right away if you are not doing well or get worse. Document Released: 10/08/2000 Document Revised: 01/03/2012 Document Reviewed: 04/19/2011 [facility] Patient Information 2014 [place], [place].  We strongly recommend that you stop smoking.  Smoking increases the risk of surgery including infection in the form of an open wound, pus formation, abscess, hernia at an incision on the abdomen, etc.  You have an increased risk of other MAJOR complications such as stroke, heart attack, forming clots in the leg and/or lungs, and death.    While it can be one of the most difficult things to do, the [place] community has programs to help you stop.  Consider  talking with your primary care physician about options.  Also, Smoking Cessation classes are available through the [facility]:  The smoking cessation program is a proven-effective program from the American [employer] Association. The program is available for anyone 38 and older who currently smokes. The program lasts for 7 weeks and is 8 sessions. Each class will be approximately 1 1/2 hours. The program is every Tuesday.  All classes are 12-1:30pm and same location.  Event Location Information:  Location: [facility] 2nd Floor Conference Room 2-037; located next to [facility] cross streets: [address] [place]  [address] [employer] Entrance into the [facility] is adjacent to the [facility] main entrance. The conference room is located on the 2nd floor.  Parking Instructions: Visitor parking is adjacent to [employer] main entrance and the [employer] [phone] or check the Classes and Support Groups   [email or web address] the event of inclemet weather please call [phone] or view online at [email or web address]

## 2013-05-28 ENCOUNTER — Telehealth: Payer: Self-pay | Admitting: Gastroenterology

## 2013-05-28 NOTE — Telephone Encounter (Signed)
Let pt know that Dr. Arlyce Dice is out of the office all week. Will call pt back when appt can be scheduled.

## 2013-06-01 ENCOUNTER — Other Ambulatory Visit: Payer: Self-pay | Admitting: Gastroenterology

## 2013-06-01 DIAGNOSIS — K220 Achalasia of cardia: Secondary | ICD-10-CM

## 2013-06-01 NOTE — Telephone Encounter (Signed)
Pt scheduled for EGD with botox at [facility] 06/21/13. Pt to arrive at 10am for an 11am appt. Pt to be NPO after midnight. Pt aware.

## 2013-06-10 ENCOUNTER — Encounter (HOSPITAL_COMMUNITY): Payer: Self-pay | Admitting: *Deleted

## 2013-06-10 ENCOUNTER — Emergency Department (HOSPITAL_COMMUNITY)
Admission: EM | Admit: 2013-06-10 | Discharge: 2013-06-10 | Disposition: A | Discharge status: Home / Self Care | Payer: Medicaid Other | Attending: Emergency Medicine | Admitting: Emergency Medicine

## 2013-06-10 DIAGNOSIS — F17200 Nicotine dependence, unspecified, uncomplicated: Secondary | ICD-10-CM | POA: Insufficient documentation

## 2013-06-10 DIAGNOSIS — Z79899 Other long term (current) drug therapy: Secondary | ICD-10-CM | POA: Insufficient documentation

## 2013-06-10 DIAGNOSIS — J45909 Unspecified asthma, uncomplicated: Secondary | ICD-10-CM | POA: Insufficient documentation

## 2013-06-10 DIAGNOSIS — R1013 Epigastric pain: Secondary | ICD-10-CM | POA: Insufficient documentation

## 2013-06-10 DIAGNOSIS — Z8781 Personal history of (healed) traumatic fracture: Secondary | ICD-10-CM | POA: Insufficient documentation

## 2013-06-10 DIAGNOSIS — Z8679 Personal history of other diseases of the circulatory system: Secondary | ICD-10-CM | POA: Insufficient documentation

## 2013-06-10 DIAGNOSIS — Z21 Asymptomatic human immunodeficiency virus [HIV] infection status: Secondary | ICD-10-CM | POA: Insufficient documentation

## 2013-06-10 DIAGNOSIS — Z8619 Personal history of other infectious and parasitic diseases: Secondary | ICD-10-CM | POA: Insufficient documentation

## 2013-06-10 DIAGNOSIS — R112 Nausea with vomiting, unspecified: Secondary | ICD-10-CM | POA: Insufficient documentation

## 2013-06-10 DIAGNOSIS — Z872 Personal history of diseases of the skin and subcutaneous tissue: Secondary | ICD-10-CM | POA: Insufficient documentation

## 2013-06-10 DIAGNOSIS — R109 Unspecified abdominal pain: Secondary | ICD-10-CM

## 2013-06-10 LAB — CBC WITH DIFFERENTIAL/PLATELET
Basophils Relative: 0 % (ref 0–1)
Eosinophils Absolute: 0.1 10*3/uL (ref 0.0–0.7)
Eosinophils Relative: 1 % (ref 0–5)
HCT: 43.1 % (ref 39.0–52.0)
Hemoglobin: 15.3 g/dL (ref 13.0–17.0)
Lymphs Abs: 1.3 10*3/uL (ref 0.7–4.0)
MCH: 25.9 pg — ABNORMAL LOW (ref 26.0–34.0)
MCHC: 35.5 g/dL (ref 30.0–36.0)
MCV: 72.9 fL — ABNORMAL LOW (ref 78.0–100.0)
Monocytes Absolute: 0.5 10*3/uL (ref 0.1–1.0)
Monocytes Relative: 5 % (ref 3–12)
Neutrophils Relative %: 79 % — ABNORMAL HIGH (ref 43–77)
RBC: 5.91 MIL/uL — ABNORMAL HIGH (ref 4.22–5.81)

## 2013-06-10 LAB — COMPREHENSIVE METABOLIC PANEL
Albumin: 3.8 g/dL (ref 3.5–5.2)
Alkaline Phosphatase: 92 U/L (ref 39–117)
BUN: 17 mg/dL (ref 6–23)
Calcium: 9.5 mg/dL (ref 8.4–10.5)
Creatinine, Ser: 1.23 mg/dL (ref 0.50–1.35)
GFR calc Af Amer: 79 mL/min — ABNORMAL LOW (ref >90)
Glucose, Bld: 107 mg/dL — ABNORMAL HIGH (ref 70–99)
Potassium: 3.8 mEq/L (ref 3.5–5.1)
Total Protein: 7.3 g/dL (ref 6.0–8.3)

## 2013-06-10 LAB — URINALYSIS, ROUTINE W REFLEX MICROSCOPIC
Bilirubin Urine: NEGATIVE
Leukocytes, UA: NEGATIVE
Nitrite: NEGATIVE
Specific Gravity, Urine: 1.029 (ref 1.005–1.030)
Urobilinogen, UA: 1 mg/dL (ref 0.0–1.0)
pH: 6 (ref 5.0–8.0)

## 2013-06-10 LAB — LIPASE, BLOOD: Lipase: 56 U/L (ref 11–59)

## 2013-06-10 MED ORDER — GI COCKTAIL ~~LOC~~: 30.0000 mL | Freq: Two times a day (BID) | ORAL | Status: DC

## 2013-06-10 MED ORDER — NITROGLYCERIN 0.4 MG SL SUBL
0.4000 mg | SUBLINGUAL_TABLET | SUBLINGUAL | Status: AC | PRN
  Administered 2013-06-10: 0.4 mg via SUBLINGUAL
  Filled 2013-06-10: qty 25

## 2013-06-10 MED ORDER — SODIUM CHLORIDE 0.9 % IV SOLN
Freq: Once | INTRAVENOUS | Status: AC
  Administered 2013-06-10: 1000 mL/h via INTRAVENOUS

## 2013-06-10 MED ORDER — GI COCKTAIL ~~LOC~~
30.0000 mL | Freq: Once | ORAL | Status: AC
  Administered 2013-06-10: 30 mL via ORAL
  Filled 2013-06-10: qty 30

## 2013-06-10 MED ORDER — OXYCODONE-ACETAMINOPHEN 5-325 MG PO TABS: 2.0000 | ORAL_TABLET | ORAL | Status: DC | PRN

## 2013-06-10 MED ORDER — HYDROMORPHONE HCL PF 1 MG/ML IJ SOLN
1.0000 mg | Freq: Once | INTRAMUSCULAR | Status: AC
  Administered 2013-06-10: 1 mg via INTRAVENOUS
  Filled 2013-06-10: qty 1

## 2013-06-10 MED ORDER — HYDROCODONE-ACETAMINOPHEN 5-325 MG PO TABS: 2.0000 | ORAL_TABLET | ORAL | Status: DC | PRN

## 2013-06-10 MED ORDER — ONDANSETRON 4 MG PO TBDP
4.0000 mg | ORAL_TABLET | Freq: Once | ORAL | Status: AC
  Administered 2013-06-10: 4 mg via ORAL
  Filled 2013-06-10: qty 1

## 2013-06-10 NOTE — ED Provider Notes (Signed)
Medical screening examination/treatment/procedure(s) were performed by non-physician practitioner and as supervising physician I was immediately available for consultation/collaboration.   Arleny Kruger, MD 06/10/13 1514 

## 2013-06-10 NOTE — ED Notes (Signed)
Reports severe abd pain and n/v since 0700. Denies diarrhea.

## 2013-06-10 NOTE — ED Provider Notes (Signed)
CSN: [id number]     Arrival date & time 06/10/13  9147 History     None    Chief Complaint  Patient presents with  . Abdominal Pain   (Consider location/radiation/quality/duration/timing/severity/associated sxs/prior Treatment) Patient is a 47 y.o. male presenting with abdominal pain. The history is provided by the patient. No language interpreter was used.  Abdominal Pain Pain location:  Epigastric Pain quality: aching and gnawing   Pain radiates to:  Does not radiate Onset quality:  Sudden Duration:  2 hours Timing:  Constant Progression:  Worsening Chronicity:  New Relieved by:  Nothing Worsened by:  Nothing tried Ineffective treatments:  None tried Associated symptoms: nausea and vomiting   Pt reports he has achalasia and Dysphagia.  Pt reports he gets botox injections for the same.   Pt reports having bad pain since 700am  Past Medical History  Diagnosis Date  . HIV (human immunodeficiency virus infection)   . Polysubstance abuse   . Boils     under arms  . Hemorrhoids   . Herpes labialis   . Psoriasis   . Achalasia and cardiospasm   . Candidiasis of unspecified site   . Dysphagia   . Asthma   . Candidiasis of mouth   . Fracture, humerus   . Hepatitis 1993    A  . 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 Pilonidal disease 01/10/2012  . Condyloma acuminatum in male of scrotum & anal canal s/p laser ablation 07/03/2012  . 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 Humeral fracture surgery    . Right shoulder replacement  2008    x 2  . Esophagogastroduodenoscopy  11/11/2011    Procedure: ESOPHAGOGASTRODUODENOSCOPY (EGD);  Surgeon: [name], MD;  Location: [facility];  Service: Endoscopy;  Laterality: N/A;  botox injection  called Pt to change time of procedure per Dr Arlyce Dice  . Esophagogastroduodenoscopy  03/10/2012   Procedure: ESOPHAGOGASTRODUODENOSCOPY (EGD);  Surgeon: [name], MD;  Location: [facility];  Service: Endoscopy;  Laterality: N/A;  . Colonoscopy  05/19/2012    Procedure: COLONOSCOPY;  Surgeon: [name], MD;  Location: [facility];  Service: Endoscopy;  Laterality: N/A;  jill trying to contact pt to come in 0830 for 930 case, phone not accepting messages  . Esophageal manometry  05/29/2012    Procedure: ESOPHAGEAL MANOMETRY (EM);  Surgeon: [name], MD;  Location: [facility];  Service: Endoscopy;  Laterality: N/A;  . Pilonidal cyst excision  07/24/2012    Procedure: CYST EXCISION PILONIDAL SIMPLE;  Surgeon: [name], MD;  Location: [facility];  Service: General;  Laterality: N/A;  Exam Under Anesthesia,, Excision Pilonidal Disease,   . Examination under anesthesia  07/24/2012    Procedure: EXAM UNDER ANESTHESIA;  Surgeon: [name], MD;  Location: [facility];  Service: General;  Laterality: N/A;  . Wart fulguration  07/24/2012    Procedure: FULGURATION ANAL WART;  Surgeon: [name], MD;  Location: [facility];  Service: General;  Laterality: N/A;  excision of raphe mass  . Esophagogastroduodenoscopy  10/11/2012    Procedure: ESOPHAGOGASTRODUODENOSCOPY (EGD);  Surgeon: [name], MD;  Location: [facility];  Service: Endoscopy;  Laterality: N/A;  . Botox injection  10/11/2012    Procedure: BOTOX INJECTION;  Surgeon: [name], MD;  Location: [facility];  Service: Endoscopy;  Laterality: N/A;  .  Esophagogastroduodenoscopy N/A 01/25/2013    Procedure: ESOPHAGOGASTRODUODENOSCOPY (EGD);  Surgeon: [name], MD;  Location: [facility];  Service: Endoscopy;  Laterality: N/A;  . Botox injection N/A 01/25/2013    Procedure: BOTOX INJECTION;  Surgeon: [name], MD;  Location: [facility];  Service: Endoscopy;  Laterality: N/A;   Family History  Problem Relation Age of Onset  . Colon cancer Neg Hx   . Stomach cancer Maternal Grandmother     ?  .  Diabetes Maternal Grandmother    History  Substance Use Topics  . Smoking status: Current Every Day Smoker -- 0.50 packs/day for 25 years    Types: Cigarettes  . Smokeless tobacco: Never Used  . Alcohol Use: No    Review of Systems  Gastrointestinal: Positive for nausea, vomiting and abdominal pain.  All other systems reviewed and are negative.    Allergies  Review of patient's allergies indicates no known allergies.  Home Medications   Current Outpatient Rx  Name  Route  Sig  Dispense  Refill  . lamiVUDine-zidovudine (COMBIVIR) 150-300 MG per tablet   Oral   Take 1 tablet by mouth daily.         Marland Kitchen lidocaine (XYLOCAINE) 2 % jelly   Topical   Apply 1 application topically as needed (for pain).          BP 116/92  Pulse 96  Temp(Src) 98 F (36.7 C) (Oral)  Resp 22  SpO2 100% Physical Exam  Constitutional: He appears well-developed and well-nourished.  HENT:  Head: Normocephalic and atraumatic.  Right Ear: External ear normal.  Eyes: Conjunctivae are normal. Pupils are equal, round, and reactive to light.  Cardiovascular: Normal rate and normal heart sounds.   Pulmonary/Chest: Effort normal and breath sounds normal.  Abdominal: Soft. There is tenderness.  Neurological: He is alert. He has normal reflexes.  Skin: Skin is warm.  Psychiatric: He has a normal mood and affect.    ED Course   Procedures (including critical care time)  Labs Reviewed  CBC WITH DIFFERENTIAL  COMPREHENSIVE METABOLIC PANEL  LIPASE, BLOOD  URINALYSIS, ROUTINE W REFLEX MICROSCOPIC   No results found. No diagnosis found.  MDM   Results for orders placed during the hospital encounter of 06/10/13  CBC WITH DIFFERENTIAL      Result Value Range   WBC 9.4  4.0 - 10.5 K/uL   RBC 5.91 (*) 4.22 - 5.81 MIL/uL   Hemoglobin 15.3  13.0 - 17.0 g/dL   HCT 47.8  29.5 - 62.1 %   MCV 72.9 (*) 78.0 - 100.0 fL   MCH 25.9 (*) 26.0 - 34.0 pg   MCHC 35.5  30.0 - 36.0 g/dL   RDW 30.8 (*) 65.7  - 15.5 %   Platelets 208  150 - 400 K/uL   Neutrophils Relative % 79 (*) 43 - 77 %   Neutro Abs 7.4  1.7 - 7.7 K/uL   Lymphocytes Relative 14  12 - 46 %   Lymphs Abs 1.3  0.7 - 4.0 K/uL   Monocytes Relative 5  3 - 12 %   Monocytes Absolute 0.5  0.1 - 1.0 K/uL   Eosinophils Relative 1  0 - 5 %   Eosinophils Absolute 0.1  0.0 - 0.7 K/uL   Basophils Relative 0  0 - 1 %   Basophils Absolute 0.0  0.0 - 0.1 K/uL  COMPREHENSIVE METABOLIC PANEL      Result Value Range   Sodium 138  135 -  145 mEq/L   Potassium 3.8  3.5 - 5.1 mEq/L   Chloride 104  96 - 112 mEq/L   CO2 23  19 - 32 mEq/L   Glucose, Bld 107 (*) 70 - 99 mg/dL   BUN 17  6 - 23 mg/dL   Creatinine, Ser 1.61  0.50 - 1.35 mg/dL   Calcium 9.5  8.4 - 09.6 mg/dL   Total Protein 7.3  6.0 - 8.3 g/dL   Albumin 3.8  3.5 - 5.2 g/dL   AST 18  0 - 37 U/L   ALT 14  0 - 53 U/L   Alkaline Phosphatase 92  39 - 117 U/L   Total Bilirubin 0.3  0.3 - 1.2 mg/dL   GFR calc non Af Amer 68 (*) >90 mL/min   GFR calc Af Amer 79 (*) >90 mL/min  LIPASE, BLOOD      Result Value Range   Lipase 56  11 - 59 U/L  URINALYSIS, ROUTINE W REFLEX MICROSCOPIC      Result Value Range   Color, Urine YELLOW  YELLOW   APPearance CLEAR  CLEAR   Specific Gravity, Urine 1.029  1.005 - 1.030   pH 6.0  5.0 - 8.0   Glucose, UA NEGATIVE  NEGATIVE mg/dL   Hgb urine dipstick NEGATIVE  NEGATIVE   Bilirubin Urine NEGATIVE  NEGATIVE   Ketones, ur 15 (*) NEGATIVE mg/dL   Protein, ur NEGATIVE  NEGATIVE mg/dL   Urobilinogen, UA 1.0  0.0 - 1.0 mg/dL   Nitrite NEGATIVE  NEGATIVE   Leukocytes, UA NEGATIVE  NEGATIVE   No results found. Pt feels better after medications.    Pt advised to follow up with Dr. Arlyce Dice as scheduled.   Pt given rx for gi cocktail,  Hydrocodone 10 tablets  Elson Areas, PA-C 06/10/13 1246

## 2013-06-21 ENCOUNTER — Ambulatory Visit (HOSPITAL_COMMUNITY)
Admission: RE | Admit: 2013-06-21 | Discharge: 2013-06-21 | Disposition: A | Discharge status: Home / Self Care | Payer: Medicaid Other | Source: Ambulatory Visit | Attending: Gastroenterology | Admitting: Gastroenterology

## 2013-06-21 ENCOUNTER — Encounter (HOSPITAL_COMMUNITY): Payer: Self-pay | Admitting: *Deleted

## 2013-06-21 ENCOUNTER — Other Ambulatory Visit: Payer: Self-pay | Admitting: Gastroenterology

## 2013-06-21 ENCOUNTER — Encounter (HOSPITAL_COMMUNITY)
Admission: RE | Disposition: A | Discharge status: Home / Self Care | Payer: Self-pay | Source: Ambulatory Visit | Attending: Gastroenterology

## 2013-06-21 DIAGNOSIS — K220 Achalasia of cardia: Secondary | ICD-10-CM | POA: Insufficient documentation

## 2013-06-21 DIAGNOSIS — Z21 Asymptomatic human immunodeficiency virus [HIV] infection status: Secondary | ICD-10-CM | POA: Insufficient documentation

## 2013-06-21 DIAGNOSIS — Z96619 Presence of unspecified artificial shoulder joint: Secondary | ICD-10-CM | POA: Insufficient documentation

## 2013-06-21 DIAGNOSIS — F17200 Nicotine dependence, unspecified, uncomplicated: Secondary | ICD-10-CM | POA: Insufficient documentation

## 2013-06-21 DIAGNOSIS — Z800 Family history of malignant neoplasm of digestive organs: Secondary | ICD-10-CM | POA: Insufficient documentation

## 2013-06-21 HISTORY — PX: BOTOX INJECTION: SHX5754

## 2013-06-21 HISTORY — PX: ESOPHAGOGASTRODUODENOSCOPY: SHX5428

## 2013-06-21 SURGERY — EGD (ESOPHAGOGASTRODUODENOSCOPY)
Anesthesia: Moderate Sedation

## 2013-06-21 MED ORDER — DIPHENHYDRAMINE HCL 50 MG/ML IJ SOLN
INTRAMUSCULAR | Status: AC
  Filled 2013-06-21: qty 1

## 2013-06-21 MED ORDER — HYOSCYAMINE SULFATE ER 0.375 MG PO TBCR: EXTENDED_RELEASE_TABLET | ORAL | Status: DC

## 2013-06-21 MED ORDER — MIDAZOLAM HCL 10 MG/2ML IJ SOLN
INTRAMUSCULAR | Status: AC
  Filled 2013-06-21: qty 2

## 2013-06-21 MED ORDER — FENTANYL CITRATE 0.05 MG/ML IJ SOLN
INTRAMUSCULAR | Status: AC
  Filled 2013-06-21: qty 2

## 2013-06-21 MED ORDER — BUTAMBEN-TETRACAINE-BENZOCAINE 2-2-14 % EX AERO
INHALATION_SPRAY | CUTANEOUS | Status: DC | PRN
  Administered 2013-06-21: 2 via TOPICAL

## 2013-06-21 MED ORDER — SODIUM CHLORIDE 0.9 % IV SOLN: INTRAVENOUS | Status: DC

## 2013-06-21 MED ORDER — SODIUM CHLORIDE 0.9 % IJ SOLN
100.0000 [IU] | Freq: Once | INTRAMUSCULAR | Status: AC
  Administered 2013-06-21: 11:00:00 via SUBMUCOSAL
  Filled 2013-06-21: qty 100

## 2013-06-21 MED ORDER — MIDAZOLAM HCL 10 MG/2ML IJ SOLN
INTRAMUSCULAR | Status: DC | PRN
  Administered 2013-06-21: 2 mg via INTRAVENOUS
  Administered 2013-06-21: 1 mg via INTRAVENOUS
  Administered 2013-06-21: 2 mg via INTRAVENOUS
  Administered 2013-06-21: 1 mg via INTRAVENOUS

## 2013-06-21 MED ORDER — FENTANYL CITRATE 0.05 MG/ML IJ SOLN
INTRAMUSCULAR | Status: DC | PRN
  Administered 2013-06-21 (×3): 25 ug via INTRAVENOUS

## 2013-06-21 NOTE — H&P (Signed)
History of Present Illness: 47 year old black male with history of achalasia here for Botox injection.  He has recurrent dysphagia solids and liquids.    Past Medical History  Diagnosis Date  . HIV (human immunodeficiency virus infection)   . Polysubstance abuse   . Boils     under arms  . Hemorrhoids   . Herpes labialis   . Psoriasis   . Achalasia and cardiospasm   . Candidiasis of unspecified site   . Dysphagia   . Asthma   . Candidiasis of mouth   . Fracture, humerus   . Hepatitis 1993    A  . 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 Pilonidal disease 01/10/2012  . Condyloma acuminatum in male of scrotum & anal canal s/p laser ablation 07/03/2012  . 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 Humeral fracture surgery    . Right shoulder replacement  2008    x 2  . Esophagogastroduodenoscopy  11/11/2011    Procedure: ESOPHAGOGASTRODUODENOSCOPY (EGD);  Surgeon: [name], MD;  Location: [facility];  Service: Endoscopy;  Laterality: N/A;  botox injection  called Pt to change time of procedure per Dr Arlyce Dice  . Esophagogastroduodenoscopy  03/10/2012    Procedure: ESOPHAGOGASTRODUODENOSCOPY (EGD);  Surgeon: [name], MD;  Location: [facility];  Service: Endoscopy;  Laterality: N/A;  . Colonoscopy  05/19/2012    Procedure: COLONOSCOPY;  Surgeon: [name], MD;  Location: [facility];  Service: Endoscopy;  Laterality: N/A;  jill trying to contact pt to come in 0830 for 930 case, phone not accepting messages  . Esophageal manometry  05/29/2012    Procedure: ESOPHAGEAL MANOMETRY (EM);  Surgeon: [name], MD;  Location: [facility];  Service: Endoscopy;  Laterality: N/A;  . Pilonidal cyst excision  07/24/2012    Procedure: CYST EXCISION PILONIDAL SIMPLE;  Surgeon: [name], MD;  Location: [facility];  Service: General;   Laterality: N/A;  Exam Under Anesthesia,, Excision Pilonidal Disease,   . Examination under anesthesia  07/24/2012    Procedure: EXAM UNDER ANESTHESIA;  Surgeon: [name], MD;  Location: [facility];  Service: General;  Laterality: N/A;  . Wart fulguration  07/24/2012    Procedure: FULGURATION ANAL WART;  Surgeon: [name], MD;  Location: [facility];  Service: General;  Laterality: N/A;  excision of raphe mass  . Esophagogastroduodenoscopy  10/11/2012    Procedure: ESOPHAGOGASTRODUODENOSCOPY (EGD);  Surgeon: [name], MD;  Location: [facility];  Service: Endoscopy;  Laterality: N/A;  . Botox injection  10/11/2012    Procedure: BOTOX INJECTION;  Surgeon: [name], MD;  Location: [facility];  Service: Endoscopy;  Laterality: N/A;  . Esophagogastroduodenoscopy N/A 01/25/2013    Procedure: ESOPHAGOGASTRODUODENOSCOPY (EGD);  Surgeon: [name], MD;  Location: [facility];  Service: Endoscopy;  Laterality: N/A;  . Botox injection N/A 01/25/2013    Procedure: BOTOX INJECTION;  Surgeon: [name], MD;  Location: [facility];  Service: Endoscopy;  Laterality: N/A;   family history includes Diabetes in his maternal grandmother; Stomach cancer in his maternal grandmother. There is no history of Colon cancer. Current Facility-Administered Medications  Medication Dose Route Frequency Provider Last Rate Last Dose  . 0.9 %  sodium chloride infusion   Intravenous Continuous [name], MD      . botox 100 units/NS 4 mL  for final conc. 25 units/mL mixture  100 Units Submucosal Once [name], MD      . butamben-tetracaine-benzocaine (CETACAINE) spray    PRN [name], MD   2 spray at 06/21/13 1112   Allergies as of 06/01/2013  . (No Known Allergies)    reports that he has been smoking Cigarettes.  He has a 12.5 pack-year smoking history. He has never used smokeless tobacco. He reports that he uses illicit drugs (Marijuana) about 7 times per week. He reports that  he does not drink alcohol.     Review of Systems: Pertinent positive and negative review of systems were noted in the above HPI section. All other review of systems were otherwise negative.  Vital signs were reviewed in today's medical record Physical Exam: General: Well developed , well nourished, no acute distress Skin: anicteric Head: Normocephalic and atraumatic Eyes:  sclerae anicteric, EOMI Ears: Normal auditory acuity Mouth: No deformity or lesions Neck: Supple, no masses or thyromegaly Lungs: Clear throughout to auscultation Heart: Regular rate and rhythm; no murmurs, rubs or bruits Abdomen: Soft, non tender and non distended. No masses, hepatosplenomegaly or hernias noted. Normal Bowel sounds Rectal:deferred Musculoskeletal: Symmetrical with no gross deformities  Skin: No lesions on visible extremities Pulses:  Normal pulses noted Extremities: No clubbing, cyanosis, edema or deformities noted Neurological: Alert oriented x 4, grossly nonfocal Cervical Nodes:  No significant cervical adenopathy Inguinal Nodes: No significant inguinal adenopathy Psychological:  Alert and cooperative. Normal mood and affect  Impression-achalasia  Plan Botox injection

## 2013-06-21 NOTE — Op Note (Signed)
[facility] [address] [place], [address]   ENDOSCOPY PROCEDURE REPORT  PATIENT: Benjamin, Herrera  MR#: [id number] BIRTHDATE: Aug 20, 1966 , 47  yrs. old GENDER: Male ENDOSCOPIST: [name], MD REFERRED BY: PROCEDURE DATE:  06/21/2013 PROCEDURE:  EGD w/ directed submucosal injection(s), any substance ASA CLASS:     Class II INDICATIONS: MEDICATIONS: These medications were titrated to patient response per physician's verbal order, Versed 6 mg IV, and Fentanyl 75 mcg IV TOPICAL ANESTHETIC: Cetacaine Spray  DESCRIPTION OF PROCEDURE: After the risks benefits and alternatives of the procedure were thoroughly explained, informed consent was obtained.  The endoscope endoscope was introduced through the mouth and advanced to the third portion of the duodenum. Without limitations.  The instrument was slowly withdrawn as the mucosa was fully examined.      The upper, middle and distal third of the esophagus were carefully inspected and no abnormalities were noted.  The z-line was well seen at the GEJ.  The endoscope was pushed into the fundus which was normal including a retroflexed view.  The antrum, gastric body, first and second part of the duodenum were unremarkable.  An injection was given to deliver drugs. 25 units (1 cc) of Botox was injected submucosally into each quadrant at the GE junction. Retroflexed views revealed no abnormalities.     The scope was then withdrawn from the patient and the procedure completed.  COMPLICATIONS: There were no complications. ENDOSCOPIC IMPRESSION: achalasia-status post Botox injection  RECOMMENDATIONS: repeat Botox injection as necessary, pending evaluation for myotomy REPEAT EXAM:  eSigned:  [name], MD 06/21/2013 11:38 AM   CC:

## 2013-06-22 ENCOUNTER — Encounter (HOSPITAL_COMMUNITY): Payer: Self-pay | Admitting: Gastroenterology

## 2013-06-26 ENCOUNTER — Telehealth: Payer: Self-pay | Admitting: Gastroenterology

## 2013-06-27 NOTE — Telephone Encounter (Signed)
Dr Arlyce Dice Patient wants alternative medication insurance will not pay for hyosyamine. What about Robinul??

## 2013-06-27 NOTE — Telephone Encounter (Signed)
ok 

## 2013-06-28 ENCOUNTER — Other Ambulatory Visit: Payer: Self-pay | Admitting: Gastroenterology

## 2013-06-28 ENCOUNTER — Telehealth: Payer: Self-pay

## 2013-06-28 DIAGNOSIS — R1319 Other dysphagia: Secondary | ICD-10-CM

## 2013-06-28 MED ORDER — GLYCOPYRROLATE 2 MG PO TABS: 2.0000 mg | ORAL_TABLET | Freq: Two times a day (BID) | ORAL | Status: DC

## 2013-06-28 NOTE — Telephone Encounter (Signed)
Message copied by Chrystie Nose on Thu Jun 28, 2013 10:49 AM ------      Message from: [name] D      Created: Thu Jun 21, 2013 12:37 PM       Please schedule SBFT ------

## 2013-06-28 NOTE — Telephone Encounter (Signed)
Pt scheduled for SBFT at [facility] 07/02/13. Pt to arrive there at 9:15am for a 9:30am appt. Pt to be NPO after midnight. Pt aware.

## 2013-06-28 NOTE — Telephone Encounter (Signed)
This patient stated he was not going to take these "Week Ass Medicines"    I explained to him we do not prescribe pain medications, Told him we can refer to pain clinic and he said he was not going to "No pain clinic"  He stated he wants Vicodin or Percocet

## 2013-06-28 NOTE — Telephone Encounter (Signed)
He can have tramadol 50-100mg  q6h # 25. He needs an OV for evaluation of pain.

## 2013-07-02 ENCOUNTER — Other Ambulatory Visit (HOSPITAL_COMMUNITY): Payer: Medicaid Other

## 2013-07-04 ENCOUNTER — Encounter (INDEPENDENT_AMBULATORY_CARE_PROVIDER_SITE_OTHER): Payer: Self-pay | Admitting: Surgery

## 2013-07-04 ENCOUNTER — Ambulatory Visit (HOSPITAL_COMMUNITY)
Admission: RE | Admit: 2013-07-04 | Discharge: 2013-07-04 | Disposition: A | Discharge status: Home / Self Care | Payer: Medicaid Other | Source: Ambulatory Visit | Attending: Gastroenterology | Admitting: Gastroenterology

## 2013-07-04 DIAGNOSIS — R109 Unspecified abdominal pain: Secondary | ICD-10-CM | POA: Insufficient documentation

## 2013-07-04 DIAGNOSIS — R1319 Other dysphagia: Secondary | ICD-10-CM

## 2013-07-04 DIAGNOSIS — R112 Nausea with vomiting, unspecified: Secondary | ICD-10-CM | POA: Insufficient documentation

## 2013-07-04 DIAGNOSIS — R197 Diarrhea, unspecified: Secondary | ICD-10-CM | POA: Insufficient documentation

## 2013-07-04 MED ORDER — TRAMADOL HCL 50 MG PO TABS: 50.0000 mg | ORAL_TABLET | Freq: Four times a day (QID) | ORAL | Status: DC | PRN

## 2013-07-04 NOTE — Telephone Encounter (Signed)
Faxed in script for pt

## 2013-07-10 ENCOUNTER — Ambulatory Visit: Payer: Medicaid Other | Admitting: Gastroenterology

## 2013-07-13 ENCOUNTER — Other Ambulatory Visit: Payer: Self-pay | Admitting: Infectious Diseases

## 2013-07-31 NOTE — Telephone Encounter (Signed)
error 

## 2013-08-06 ENCOUNTER — Other Ambulatory Visit (INDEPENDENT_AMBULATORY_CARE_PROVIDER_SITE_OTHER): Payer: Medicaid Other

## 2013-08-06 ENCOUNTER — Ambulatory Visit: Payer: Medicaid Other | Admitting: Gastroenterology

## 2013-08-06 DIAGNOSIS — B20 Human immunodeficiency virus [HIV] disease: Secondary | ICD-10-CM

## 2013-08-06 LAB — CBC
MCV: 71.7 fL — ABNORMAL LOW (ref 78.0–100.0)
Platelets: 199 10*3/uL (ref 150–400)
RBC: 5.97 MIL/uL — ABNORMAL HIGH (ref 4.22–5.81)
RDW: 17.9 % — ABNORMAL HIGH (ref 11.5–15.5)
WBC: 5.9 10*3/uL (ref 4.0–10.5)

## 2013-08-06 LAB — COMPREHENSIVE METABOLIC PANEL
ALT: 12 U/L (ref 0–53)
AST: 16 U/L (ref 0–37)
CO2: 28 mEq/L (ref 19–32)
Calcium: 9.4 mg/dL (ref 8.4–10.5)
Chloride: 106 mEq/L (ref 96–112)
Creat: 1.25 mg/dL (ref 0.50–1.35)
Potassium: 3.9 mEq/L (ref 3.5–5.3)
Sodium: 139 mEq/L (ref 135–145)
Total Protein: 7.1 g/dL (ref 6.0–8.3)

## 2013-08-07 LAB — HIV-1 RNA QUANT-NO REFLEX-BLD
HIV 1 RNA Quant: <20 copies/mL (ref <20)
HIV-1 RNA Quant, Log: <1.3 {Log} (ref <1.30)

## 2013-08-07 LAB — T-HELPER CELL (CD4) - (RCID CLINIC ONLY): CD4 T Cell Abs: 420 /uL (ref 400–2700)

## 2013-08-08 ENCOUNTER — Other Ambulatory Visit: Payer: Medicaid Other

## 2013-08-22 ENCOUNTER — Ambulatory Visit (INDEPENDENT_AMBULATORY_CARE_PROVIDER_SITE_OTHER): Payer: Medicaid Other | Admitting: Infectious Diseases

## 2013-08-22 ENCOUNTER — Encounter: Payer: Self-pay | Admitting: Infectious Diseases

## 2013-08-22 VITALS — BP 143/90 | HR 80 | Temp 98.3°F | Wt 238.0 lb

## 2013-08-22 DIAGNOSIS — S42309A Unspecified fracture of shaft of humerus, unspecified arm, initial encounter for closed fracture: Secondary | ICD-10-CM

## 2013-08-22 DIAGNOSIS — B20 Human immunodeficiency virus [HIV] disease: Secondary | ICD-10-CM

## 2013-08-22 DIAGNOSIS — Z23 Encounter for immunization: Secondary | ICD-10-CM

## 2013-08-22 MED ORDER — DOLUTEGRAVIR SODIUM 50 MG PO TABS: 50.0000 mg | ORAL_TABLET | Freq: Every day | ORAL | Status: DC

## 2013-08-22 MED ORDER — TRAMADOL 5 MG/ML ORAL SUSPENSION: 50.0000 mg | Freq: Two times a day (BID) | ORAL | Status: DC

## 2013-08-22 NOTE — Assessment & Plan Note (Signed)
Will give him ultram til he can get into ortho

## 2013-08-22 NOTE — Progress Notes (Signed)
  Subjective:    Patient ID: Benjamin Herrera, male    DOB: [birth year], 47 y.o.   MRN: [id number]  HPI 47 yo M with HIV+, anal condyloma, achalasia, HTN. R shoulder pain (previous rod in his shoulder). Has not slept in days due to shoulder pain.  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 Was taking TRV/ISN, had DRVr added at his visit 06-09-12.  Denies missed ART. Taking tivicay and complera.   HIV 1 RNA Quant (copies/mL)  Date Value  08/06/2013 <20   01/25/2013 <20   12/14/2012 <20      CD4 T Cell Abs (/uL)  Date Value  08/06/2013 420   01/25/2013 290*  12/14/2012 300*   Review of Systems  Constitutional: Negative for appetite change and unexpected weight change.  Gastrointestinal: Negative for diarrhea and constipation.       Mild dysphagia.   Genitourinary: Negative for difficulty urinating.  Musculoskeletal: Positive for arthralgias.      Objective:   Physical Exam  Constitutional: He appears well-developed and well-nourished.  HENT:  Mouth/Throat: No oropharyngeal exudate.  Eyes: EOM are normal. Pupils are equal, round, and reactive to light.  Neck: Normal range of motion. Neck supple.  Cardiovascular: Normal rate, regular rhythm and normal heart sounds.   Pulmonary/Chest: Effort normal and breath sounds normal.  Abdominal: Soft. Bowel sounds are normal. He exhibits no distension. There is no tenderness.  Musculoskeletal: He exhibits tenderness. He exhibits no edema.       Arms:         Assessment & Plan:

## 2013-08-22 NOTE — Assessment & Plan Note (Signed)
Is doing better. Given condoms. Gets flu shot. Will see him back in 6 months.

## 2013-08-23 ENCOUNTER — Telehealth: Payer: Self-pay | Admitting: *Deleted

## 2013-08-23 DIAGNOSIS — Z23 Encounter for immunization: Secondary | ICD-10-CM

## 2013-08-23 NOTE — Telephone Encounter (Signed)
Unable to refer to orthopedist for his shoulder.  Patient has [employer].  RN left message informing him that the referral needs to come from the PCP listed on his card.   Andree Coss, RN

## 2013-09-18 ENCOUNTER — Encounter: Payer: Self-pay | Admitting: Gastroenterology

## 2013-09-18 ENCOUNTER — Ambulatory Visit: Payer: Medicaid Other | Admitting: Infectious Diseases

## 2013-09-18 ENCOUNTER — Ambulatory Visit (INDEPENDENT_AMBULATORY_CARE_PROVIDER_SITE_OTHER): Payer: Medicaid Other | Admitting: Gastroenterology

## 2013-09-18 VITALS — BP 110/78 | HR 73 | Ht 73.0 in | Wt 236.0 lb

## 2013-09-18 DIAGNOSIS — K220 Achalasia of cardia: Secondary | ICD-10-CM

## 2013-09-18 DIAGNOSIS — R1013 Epigastric pain: Secondary | ICD-10-CM

## 2013-09-18 MED ORDER — DICYCLOMINE HCL 10 MG PO CAPS: 10.0000 mg | ORAL_CAPSULE | Freq: Three times a day (TID) | ORAL | Status: DC

## 2013-09-18 NOTE — Assessment & Plan Note (Addendum)
Dysphagia is responsive to Botox.  He may have cardiospasm or perhaps early achalasia.  For further evaluation and therapy he will followup at [facility].  Dysphagia has recurred since his last injection in August, 2014.  Since it will be 3 months before he can get a followup visit at [facility] I will repeat his Botox injection.

## 2013-09-18 NOTE — Assessment & Plan Note (Addendum)
He continues to complain of abdominal pain in his upper and lower abdomen.  I am unable to determine cause.  Workup including small bowel   I will prescribe dicyclomine and I've asked him to followup for this problem as well at [facility]

## 2013-09-18 NOTE — Patient Instructions (Addendum)
We have sent the following medications to your pharmacy for you to pick up at your convenience:  Bentyl  Per Dr. Arlyce Dice, please make sure to follow up with [facility]  February 2nd Monday at 1:30pm

## 2013-09-18 NOTE — Addendum Note (Signed)
Addended by: Marlowe Kays on: 09/18/2013 03:14 PM   Modules accepted: Orders

## 2013-09-18 NOTE — Progress Notes (Signed)
History of Present Illness:  Benjamin Herrera has returned following Botox injection.  This has relieved his dysphagia.  He continues to complain of crampy upper and lower bowel pain.  Small bowel follow through was unremarkable.  2013 colonoscopy was normal. He has been seen at [facility] for evaluation of dysphagia and abdominal pain.  He was given trazodone but he did not tolerate this.  Botox does help his dysphagia.    Review of Systems: Pertinent positive and negative review of systems were noted in the above HPI section. All other review of systems were otherwise negative.    Current Medications, Allergies, Past Medical History, Past Surgical History, Family History and Social History were reviewed in [employer] electronic medical record  Vital signs were reviewed in today's medical record. Physical Exam: General: Well developed , well nourished, no acute distress

## 2013-09-24 ENCOUNTER — Encounter (HOSPITAL_COMMUNITY): Payer: Self-pay | Admitting: Pharmacy Technician

## 2013-09-24 ENCOUNTER — Encounter (HOSPITAL_COMMUNITY): Payer: Self-pay | Admitting: *Deleted

## 2013-10-08 ENCOUNTER — Encounter (HOSPITAL_COMMUNITY)
Admission: RE | Disposition: A | Discharge status: Home / Self Care | Payer: Self-pay | Source: Ambulatory Visit | Attending: Gastroenterology

## 2013-10-08 ENCOUNTER — Ambulatory Visit (HOSPITAL_COMMUNITY)
Admission: RE | Admit: 2013-10-08 | Discharge: 2013-10-08 | Disposition: A | Discharge status: Home / Self Care | Payer: Medicaid Other | Source: Ambulatory Visit | Attending: Gastroenterology | Admitting: Gastroenterology

## 2013-10-08 ENCOUNTER — Encounter (HOSPITAL_COMMUNITY): Payer: Medicaid Other | Admitting: Anesthesiology

## 2013-10-08 ENCOUNTER — Encounter (HOSPITAL_COMMUNITY): Payer: Self-pay | Admitting: *Deleted

## 2013-10-08 ENCOUNTER — Ambulatory Visit (HOSPITAL_COMMUNITY): Payer: Medicaid Other | Admitting: Anesthesiology

## 2013-10-08 DIAGNOSIS — K220 Achalasia of cardia: Secondary | ICD-10-CM

## 2013-10-08 DIAGNOSIS — I10 Essential (primary) hypertension: Secondary | ICD-10-CM | POA: Insufficient documentation

## 2013-10-08 DIAGNOSIS — R05 Cough: Secondary | ICD-10-CM | POA: Insufficient documentation

## 2013-10-08 DIAGNOSIS — K759 Inflammatory liver disease, unspecified: Secondary | ICD-10-CM | POA: Insufficient documentation

## 2013-10-08 DIAGNOSIS — R109 Unspecified abdominal pain: Secondary | ICD-10-CM | POA: Insufficient documentation

## 2013-10-08 DIAGNOSIS — R1013 Epigastric pain: Secondary | ICD-10-CM

## 2013-10-08 DIAGNOSIS — R059 Cough, unspecified: Secondary | ICD-10-CM | POA: Insufficient documentation

## 2013-10-08 DIAGNOSIS — R1310 Dysphagia, unspecified: Secondary | ICD-10-CM | POA: Insufficient documentation

## 2013-10-08 DIAGNOSIS — J45909 Unspecified asthma, uncomplicated: Secondary | ICD-10-CM | POA: Insufficient documentation

## 2013-10-08 DIAGNOSIS — F17200 Nicotine dependence, unspecified, uncomplicated: Secondary | ICD-10-CM | POA: Insufficient documentation

## 2013-10-08 HISTORY — DX: Essential (primary) hypertension: I10

## 2013-10-08 HISTORY — PX: ESOPHAGOGASTRODUODENOSCOPY (EGD) WITH PROPOFOL: SHX5813

## 2013-10-08 HISTORY — PX: BOTOX INJECTION: SHX5754

## 2013-10-08 SURGERY — ESOPHAGOGASTRODUODENOSCOPY (EGD) WITH PROPOFOL
Anesthesia: Monitor Anesthesia Care

## 2013-10-08 MED ORDER — PROPOFOL 10 MG/ML IV BOLUS
INTRAVENOUS | Status: AC
  Filled 2013-10-08: qty 20

## 2013-10-08 MED ORDER — SODIUM CHLORIDE 0.9 % IV SOLN: INTRAVENOUS | Status: DC

## 2013-10-08 MED ORDER — KETAMINE HCL 50 MG/ML IJ SOLN
INTRAMUSCULAR | Status: AC
  Filled 2013-10-08: qty 10

## 2013-10-08 MED ORDER — ONABOTULINUMTOXINA 100 UNITS IJ SOLR
100.0000 [IU] | Freq: Once | INTRAMUSCULAR | Status: AC
Start: 2013-10-08 — End: 2013-10-08
  Administered 2013-10-08: 100 [IU] via SUBMUCOSAL
  Filled 2013-10-08: qty 100

## 2013-10-08 MED ORDER — GLYCOPYRROLATE 0.2 MG/ML IJ SOLN
INTRAMUSCULAR | Status: AC
  Filled 2013-10-08: qty 1

## 2013-10-08 MED ORDER — LACTATED RINGERS IV SOLN
INTRAVENOUS | Status: DC
  Administered 2013-10-08: 11:00:00 via INTRAVENOUS
  Administered 2013-10-08: 1000 mL via INTRAVENOUS

## 2013-10-08 MED ORDER — MIDAZOLAM HCL 2 MG/2ML IJ SOLN
INTRAMUSCULAR | Status: AC
  Filled 2013-10-08: qty 2

## 2013-10-08 MED ORDER — BUTAMBEN-TETRACAINE-BENZOCAINE 2-2-14 % EX AERO
INHALATION_SPRAY | CUTANEOUS | Status: DC | PRN
  Administered 2013-10-08: 2 via TOPICAL

## 2013-10-08 MED ORDER — PROPOFOL INFUSION 10 MG/ML OPTIME
INTRAVENOUS | Status: DC | PRN
  Administered 2013-10-08: 100 ug/kg/min via INTRAVENOUS

## 2013-10-08 MED ORDER — MIDAZOLAM HCL 5 MG/5ML IJ SOLN
INTRAMUSCULAR | Status: DC | PRN
  Administered 2013-10-08: 2 mg via INTRAVENOUS

## 2013-10-08 MED ORDER — PROPOFOL 10 MG/ML IV BOLUS
INTRAVENOUS | Status: DC | PRN
  Administered 2013-10-08: 30 mg via INTRAVENOUS
  Administered 2013-10-08: 50 mg via INTRAVENOUS

## 2013-10-08 MED ORDER — KETAMINE HCL 50 MG/ML IJ SOLN
INTRAMUSCULAR | Status: DC | PRN
  Administered 2013-10-08 (×2): 25 mg via INTRAMUSCULAR

## 2013-10-08 SURGICAL SUPPLY — 14 items

## 2013-10-08 NOTE — Interval H&P Note (Signed)
History and Physical Interval Note:  10/08/2013 11:23 AM  Benjamin Herrera  has presented today for surgery, with the diagnosis of Dysphagia [787.20]  The various methods of treatment have been discussed with the patient and family. After consideration of risks, benefits and other options for treatment, the patient has consented to  Procedure(s): ESOPHAGOGASTRODUODENOSCOPY (EGD) WITH PROPOFOL (N/A) BOTOX INJECTION (N/A) as a surgical intervention .  The patient's history has been reviewed, patient examined, no change in status, stable for surgery.  I have reviewed the patient's chart and labs.  Questions were answered to the patient's satisfaction.     The recent H&P (dated *09/18/13**) was reviewed, the patient was examined and there is no change in the patients condition since that H&P was completed.   Benjamin Herrera  10/08/2013, 11:24 AM   Benjamin Herrera

## 2013-10-08 NOTE — Anesthesia Preprocedure Evaluation (Addendum)
Anesthesia Evaluation  Patient identified by MRN, date of birth, ID band Patient awake  General Assessment Comment:HIV positive.  Reviewed: Allergy & Precautions, H&P , NPO status , Patient's Chart, lab work & pertinent test results  Airway Mallampati: II TM Distance: >3 FB Neck ROM: Full    Dental  (+) Partial Lower and Partial Upper   Pulmonary asthma , Current Smoker,  breath sounds clear to auscultation  Pulmonary exam normal       Cardiovascular Exercise Tolerance: Good hypertension, negative cardio ROS  Rhythm:Regular Rate:Normal     Neuro/Psych negative neurological ROS  negative psych ROS   GI/Hepatic (+) Hepatitis -  Endo/Other  negative endocrine ROS  Renal/GU Renal disease  negative genitourinary   Musculoskeletal negative musculoskeletal ROS (+)   Abdominal (+) + obese,   Peds negative pediatric ROS (+)  Hematology negative hematology ROS (+)   Anesthesia Other Findings   Reproductive/Obstetrics negative OB ROS                          Anesthesia Physical Anesthesia Plan  ASA: III  Anesthesia Plan: MAC   Post-op Pain Management:    Induction: Intravenous  Airway Management Planned:   Additional Equipment:   Intra-op Plan:   Post-operative Plan:   Informed Consent: I have reviewed the patients History and Physical, chart, labs and discussed the procedure including the risks, benefits and alternatives for the proposed anesthesia with the patient or authorized representative who has indicated his/her understanding and acceptance.   Dental advisory given  Plan Discussed with: CRNA  Anesthesia Plan Comments:         Anesthesia Quick Evaluation

## 2013-10-08 NOTE — Op Note (Addendum)
[facility] [address] [place], [address]   ENDOSCOPY PROCEDURE REPORT  PATIENT: Benjamin, Herrera  MR#: [id number] BIRTHDATE: [birth year] , 47  yrs. old GENDER: Male ENDOSCOPIST: [name], MD REFERRED BY: PROCEDURE DATE:  10/08/2013 PROCEDURE:  EGD w/ directed submucosal injection(s), any substance ASA CLASS:     Class II INDICATIONS:  Therapeutic procedure. Botox injection for achalasia MEDICATIONS: MAC sedation, administered by CRNA TOPICAL ANESTHETIC:  DESCRIPTION OF PROCEDURE: After the risks benefits and alternatives of the procedure were thoroughly explained, informed consent was obtained.  The Pentax Gastroscope [id number] endoscope was introduced through the mouth and advanced to the third portion of the duodenum. Without limitations.  The instrument was slowly withdrawn as the mucosa was fully examined.      The upper, middle and distal third of the esophagus were carefully inspected and no abnormalities were noted.  The z-line was well seen at the GEJ.  The endoscope was pushed into the fundus which was normal including a retroflexed view.  The antrum, gastric body, first and second part of the duodenum were unremarkable. Retroflexed views revealed no abnormalities.   25 units (1 cc) of Botox was injected submucosally into each quadrant at the GE junction.  The scope was then withdrawn from the patient and the procedure completed.  COMPLICATIONS: There were no complications. ENDOSCOPIC IMPRESSION: Botox injection for achalasia  RECOMMENDATIONS: Patient is scheduled for a followup appointment at [facility] for further evaluation of achalasia REPEAT EXAM:  eSigned:  [name], MD 10/08/2013 12:15 PM   CC:

## 2013-10-08 NOTE — Progress Notes (Signed)
Error in charting of timeout  Actual timeout done at 1153 prior to scope being passed by Dr Arlyce Dice not at 1200 B.Unk Lightning.N.

## 2013-10-08 NOTE — Transfer of Care (Signed)
Immediate Anesthesia Transfer of Care Note  Patient: Benjamin Herrera  Procedure(s) Performed: Procedure(s): ESOPHAGOGASTRODUODENOSCOPY (EGD) WITH PROPOFOL (N/A) BOTOX INJECTION (N/A)  Patient Location: PACU  Anesthesia Type:MAC  Level of Consciousness: awake and alert   Airway & Oxygen Therapy: Patient Spontanous Breathing and Patient connected to face mask oxygen  Post-op Assessment: Report given to PACU RN and Post -op Vital signs reviewed and stable  Post vital signs: Reviewed and stable  Complications: No apparent anesthesia complications

## 2013-10-08 NOTE — H&P (View-Only) (Signed)
History of Present Illness:  Benjamin Herrera has returned following Botox injection.  This has relieved his dysphagia.  He continues to complain of crampy upper and lower bowel pain.  Small bowel follow through was unremarkable.  2013 colonoscopy was normal. He has been seen at [facility] for evaluation of dysphagia and abdominal pain.  He was given trazodone but he did not tolerate this.  Botox does help his dysphagia.    Review of Systems: Pertinent positive and negative review of systems were noted in the above HPI section. All other review of systems were otherwise negative.    Current Medications, Allergies, Past Medical History, Past Surgical History, Family History and Social History were reviewed in [facility] electronic medical record  Vital signs were reviewed in today's medical record. Physical Exam: General: Well developed , well nourished, no acute distress     

## 2013-10-08 NOTE — Progress Notes (Addendum)
Pt. Sister at bedside. Pt appears more comfortable. Lung sounds clear in all fields. Report to Dan-pt keeps coughing.Dan made aware and MD to be called.

## 2013-10-08 NOTE — Anesthesia Postprocedure Evaluation (Signed)
  Anesthesia Post-op Note  Patient: Benjamin Herrera  Procedure(s) Performed: Procedure(s) (LRB): ESOPHAGOGASTRODUODENOSCOPY (EGD) WITH PROPOFOL (N/A) BOTOX INJECTION (N/A)  Patient Location: PACU  Anesthesia Type: MAC  Level of Consciousness: awake and alert   Airway and Oxygen Therapy: Patient Spontanous Breathing  Post-op Pain: mild  Post-op Assessment: Post-op Vital signs reviewed, Patient's Cardiovascular Status Stable, Respiratory Function Stable, Patent Airway and No signs of Nausea or vomiting  Last Vitals:  Filed Vitals:   10/08/13 1225  BP: 133/104  Pulse: 95  Temp:   Resp: 15    Post-op Vital Signs: stable   Complications: No apparent anesthesia complications

## 2013-10-09 ENCOUNTER — Encounter (HOSPITAL_COMMUNITY): Payer: Self-pay | Admitting: Gastroenterology

## 2013-12-28 ENCOUNTER — Telehealth: Payer: Self-pay | Admitting: Gastroenterology

## 2013-12-28 NOTE — Telephone Encounter (Signed)
He has been seen by Dr. Tally Joe at [facility] and he needs a followup appointment for his presumed achalasia.  He should have more definitive therapy if they agree with the diagnosis.

## 2013-12-28 NOTE — Telephone Encounter (Signed)
Pt was seen by Dr. Johnnette Gourd at [facility] last time, call placed to [facility] for records from [facility]. Let pt know I would call him back once records received and reviewed.

## 2013-12-28 NOTE — Telephone Encounter (Signed)
Pt called and states he is having difficulty swallowing again and would like to schedule EGD with Botox. Pt also wanted to know if Dr. Deatra Ina has spoken with his doctor at [facility]. Please advise.

## 2014-01-02 NOTE — Telephone Encounter (Signed)
Note from [facility] to Dr. Deatra Ina for review. Please advise if pt ok for EGD with botox .

## 2014-01-03 NOTE — Telephone Encounter (Signed)
The last note I see is from 7/14.  Is there another node or a more recent note?

## 2014-01-03 NOTE — Telephone Encounter (Signed)
Note placed in Dr. Kelby Fam box for review.

## 2014-01-04 ENCOUNTER — Encounter: Payer: Self-pay | Admitting: Gastroenterology

## 2014-01-04 ENCOUNTER — Other Ambulatory Visit: Payer: Self-pay

## 2014-01-04 DIAGNOSIS — R1319 Other dysphagia: Secondary | ICD-10-CM

## 2014-01-04 MED ORDER — DILTIAZEM HCL 60 MG PO TABS: 60.0000 mg | ORAL_TABLET | Freq: Four times a day (QID) | ORAL | Status: DC

## 2014-01-04 NOTE — Telephone Encounter (Signed)
Latest notes from [facility] were reviewed.  Plan to obtain a barium esophagram.  Begin diltiazem 60 mg 4 times a day.  The patient we will reassess his response to diltiazem.  If not improved I will repeat Botox injection.  Please ask him to call back after 2 weeks of therapy.

## 2014-01-04 NOTE — Telephone Encounter (Signed)
Pt scheduled for Barium esophagram at [facility] 01/08/14@11am , pt to arrive there at 10:45am. Script sent to pharmacy. Pt knows to call with report after taking med for 2 weeks.

## 2014-01-04 NOTE — Progress Notes (Signed)
Patient ID: Benjamin Herrera, male   DOB: [birth year], 48 y.o.   MRN: [id number] I reviewed the consultation note from [facility] by Dr. Johnnette Gourd.  Esophageal manometry was done and found increased simultaneous contractions and a normal LES function consistent with esophageal spasm.  As per their recommendations I will repeat a barium esophagram.  I will also start him on diltiazem to determine whether this alleviates his dysphagia.

## 2014-01-08 ENCOUNTER — Ambulatory Visit (HOSPITAL_COMMUNITY)
Admission: RE | Admit: 2014-01-08 | Discharge: 2014-01-08 | Disposition: A | Discharge status: Home / Self Care | Payer: Medicaid Other | Source: Ambulatory Visit | Attending: Gastroenterology | Admitting: Gastroenterology

## 2014-01-08 DIAGNOSIS — R1310 Dysphagia, unspecified: Secondary | ICD-10-CM | POA: Diagnosis present

## 2014-01-08 DIAGNOSIS — K449 Diaphragmatic hernia without obstruction or gangrene: Secondary | ICD-10-CM | POA: Insufficient documentation

## 2014-01-08 DIAGNOSIS — R1319 Other dysphagia: Secondary | ICD-10-CM

## 2014-03-12 ENCOUNTER — Other Ambulatory Visit: Payer: Medicaid Other

## 2014-04-22 ENCOUNTER — Other Ambulatory Visit (INDEPENDENT_AMBULATORY_CARE_PROVIDER_SITE_OTHER): Payer: Medicaid Other

## 2014-04-22 DIAGNOSIS — Z5189 Encounter for other specified aftercare: Secondary | ICD-10-CM

## 2014-04-22 DIAGNOSIS — B20 Human immunodeficiency virus [HIV] disease: Secondary | ICD-10-CM

## 2014-04-22 DIAGNOSIS — Z113 Encounter for screening for infections with a predominantly sexual mode of transmission: Secondary | ICD-10-CM

## 2014-04-23 LAB — CBC WITH DIFFERENTIAL/PLATELET
BASOS PCT: 0 % (ref 0–1)
Basophils Absolute: 0 10*3/uL (ref 0.0–0.1)
EOS ABS: 0.1 10*3/uL (ref 0.0–0.7)
Eosinophils Relative: 2 % (ref 0–5)
HCT: 44 % (ref 39.0–52.0)
Hemoglobin: 14.7 g/dL (ref 13.0–17.0)
Lymphocytes Relative: 37 % (ref 12–46)
Lymphs Abs: 2.5 10*3/uL (ref 0.7–4.0)
MCH: 25.3 pg — AB (ref 26.0–34.0)
MCHC: 33.4 g/dL (ref 30.0–36.0)
MCV: 75.7 fL — ABNORMAL LOW (ref 78.0–100.0)
Monocytes Absolute: 0.3 10*3/uL (ref 0.1–1.0)
Monocytes Relative: 5 % (ref 3–12)
NEUTROS ABS: 3.8 10*3/uL (ref 1.7–7.7)
Neutrophils Relative %: 56 % (ref 43–77)
PLATELETS: 181 10*3/uL (ref 150–400)
RBC: 5.81 MIL/uL (ref 4.22–5.81)
RDW: 16 % — ABNORMAL HIGH (ref 11.5–15.5)
WBC: 6.8 10*3/uL (ref 4.0–10.5)

## 2014-04-23 LAB — LIPID PANEL
CHOLESTEROL: 165 mg/dL (ref 0–200)
HDL: 29 mg/dL — ABNORMAL LOW (ref >39)
LDL Cholesterol: 106 mg/dL — ABNORMAL HIGH (ref 0–99)
Total CHOL/HDL Ratio: 5.7 Ratio
Triglycerides: 149 mg/dL (ref <150)
VLDL: 30 mg/dL (ref 0–40)

## 2014-04-23 LAB — RPR

## 2014-04-23 LAB — T-HELPER CELL (CD4) - (RCID CLINIC ONLY)
CD4 T CELL ABS: 320 /uL — AB (ref 400–2700)
CD4 T CELL HELPER: 14 % — AB (ref 33–55)

## 2014-04-23 LAB — COMPREHENSIVE METABOLIC PANEL
ALK PHOS: 96 U/L (ref 39–117)
ALT: 16 U/L (ref 0–53)
AST: 19 U/L (ref 0–37)
Albumin: 3.9 g/dL (ref 3.5–5.2)
BUN: 8 mg/dL (ref 6–23)
CALCIUM: 8.9 mg/dL (ref 8.4–10.5)
CHLORIDE: 103 meq/L (ref 96–112)
CO2: 27 meq/L (ref 19–32)
Creat: 1.06 mg/dL (ref 0.50–1.35)
Glucose, Bld: 86 mg/dL (ref 70–99)
POTASSIUM: 4 meq/L (ref 3.5–5.3)
SODIUM: 137 meq/L (ref 135–145)
TOTAL PROTEIN: 6.6 g/dL (ref 6.0–8.3)
Total Bilirubin: 0.3 mg/dL (ref 0.2–1.2)

## 2014-04-26 LAB — HIV-1 RNA QUANT-NO REFLEX-BLD
HIV 1 RNA Quant: <20 copies/mL (ref <20)
HIV-1 RNA Quant, Log: <1.3 {Log} (ref <1.30)

## 2014-06-11 ENCOUNTER — Encounter: Payer: Self-pay | Admitting: Infectious Diseases

## 2014-06-11 ENCOUNTER — Ambulatory Visit (INDEPENDENT_AMBULATORY_CARE_PROVIDER_SITE_OTHER): Payer: Medicaid Other | Admitting: Infectious Diseases

## 2014-06-11 VITALS — BP 136/83 | HR 80 | Temp 98.1°F | Ht 73.5 in | Wt 238.0 lb

## 2014-06-11 DIAGNOSIS — Z79899 Other long term (current) drug therapy: Secondary | ICD-10-CM

## 2014-06-11 DIAGNOSIS — Z113 Encounter for screening for infections with a predominantly sexual mode of transmission: Secondary | ICD-10-CM | POA: Diagnosis not present

## 2014-06-11 DIAGNOSIS — B20 Human immunodeficiency virus [HIV] disease: Secondary | ICD-10-CM | POA: Diagnosis not present

## 2014-06-11 DIAGNOSIS — M791 Myalgia, unspecified site: Secondary | ICD-10-CM

## 2014-06-11 DIAGNOSIS — IMO0001 Reserved for inherently not codable concepts without codable children: Secondary | ICD-10-CM | POA: Diagnosis not present

## 2014-06-11 DIAGNOSIS — Z23 Encounter for immunization: Secondary | ICD-10-CM

## 2014-06-11 MED ORDER — CYCLOBENZAPRINE HCL 5 MG PO TABS: 5.0000 mg | ORAL_TABLET | Freq: Three times a day (TID) | ORAL | Status: DC | PRN

## 2014-06-11 NOTE — Progress Notes (Signed)
   Subjective:    Patient ID: Benjamin Herrera, male    DOB: 12/14/1965, 48 y.o.   MRN: [id number]  HPI 48 yo M with HIV+, anal condyloma, achalasia, HTN. R shoulder pain (previous rod in his shoulder). Has not slept in days due to shoulder pain.  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 Was taking TRV/ISN, had DRVr added at his visit 06-09-12. Then changed to tivicay and complera.  Having worsening back pain. No recent accident or heavy lifting. Would like to see chiropractor. No change in BM or urine. No numbness or tingling in feet. Has not taken any rx (OTC or otherwise).  No problems with ART.   HIV 1 RNA Quant (copies/mL)  Date Value  04/22/2014 <20   08/06/2013 <20   01/25/2013 <20      CD4 T Cell Abs (/uL)  Date Value  04/22/2014 320*  08/06/2013 420   01/25/2013 290*    Review of Systems  Constitutional: Negative for appetite change and unexpected weight change.  Gastrointestinal: Negative for diarrhea and constipation.  Genitourinary: Negative for difficulty urinating.  Musculoskeletal: Positive for back pain.       Objective:   Physical Exam  Constitutional: He appears well-developed and well-nourished.  HENT:  Mouth/Throat: No oropharyngeal exudate.  Eyes: EOM are normal.  Neck: Neck supple.  Cardiovascular: Normal rate, regular rhythm and normal heart sounds.   Pulmonary/Chest: Effort normal and breath sounds normal.  Abdominal: Soft. Bowel sounds are normal. He exhibits no distension. There is no tenderness.  Musculoskeletal: He exhibits no edema.       Arms: Lymphadenopathy:    He has no cervical adenopathy.  Neurological:  5/5 strength in LE.          Assessment & Plan:

## 2014-06-11 NOTE — Assessment & Plan Note (Signed)
Will continue him on his atypical but effective rx.  Will restart hep B series.  He is given condoms, flu shot Will see him back in 6 months.

## 2014-06-11 NOTE — Assessment & Plan Note (Signed)
Not clear of source. 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 Will check plain films of spine, mri of hip. Give him flexeril, if studies (-), he wishes to be seen by chiropracter.

## 2014-06-14 ENCOUNTER — Telehealth: Payer: Self-pay | Admitting: *Deleted

## 2014-06-14 NOTE — Telephone Encounter (Signed)
Lumbar xray scheduled for 8/25 1:00 (12:45 arrival).  Pt accepted.  MRI PA pending. Landis Gandy, RN

## 2014-06-18 ENCOUNTER — Ambulatory Visit (HOSPITAL_COMMUNITY)
Admission: RE | Admit: 2014-06-18 | Discharge: 2014-06-18 | Disposition: A | Discharge status: Home / Self Care | Payer: Medicaid Other | Source: Ambulatory Visit | Attending: Infectious Diseases | Admitting: Infectious Diseases

## 2014-06-18 DIAGNOSIS — IMO0001 Reserved for inherently not codable concepts without codable children: Secondary | ICD-10-CM | POA: Insufficient documentation

## 2014-06-18 DIAGNOSIS — M791 Myalgia, unspecified site: Secondary | ICD-10-CM

## 2014-07-04 ENCOUNTER — Telehealth: Payer: Self-pay | Admitting: *Deleted

## 2014-07-04 NOTE — Telephone Encounter (Signed)
MRI request was denied. Per payer, patient must have a recent xray (done) AND have tried/failed a 6 week, physician directed, conservative course of treatment such as RICE, ice, PT, NSAIDS, etc. Will have to submit a new request after this course of treatment if the provider still wants to have an MRI. Landis Gandy, RN

## 2014-07-05 ENCOUNTER — Telehealth: Payer: Self-pay | Admitting: *Deleted

## 2014-07-05 NOTE — Telephone Encounter (Signed)
Patient called asking about his MRI.  Pt was notified that per his insurance, he needs to to try 6 weeks of physician directed conservative therapy.  Patient states he is already on pills, and they aren't working (he was prescribed 10 days worth of flexeril 8/18 and when questioned about it, says "it didn't work, I didn't take them"). Pt given appointment for 6 week follow up with Dr. Johnnye Sima.  Patient advised to have his pharmacy request refill of the flexeril when he needs it. Landis Gandy, RN

## 2014-08-01 ENCOUNTER — Encounter (HOSPITAL_COMMUNITY): Payer: Self-pay | Admitting: Emergency Medicine

## 2014-08-01 ENCOUNTER — Emergency Department (HOSPITAL_COMMUNITY)
Admission: EM | Admit: 2014-08-01 | Discharge: 2014-08-01 | Disposition: A | Discharge status: Home / Self Care | Payer: Medicaid Other | Source: Home / Self Care

## 2014-08-01 DIAGNOSIS — M67432 Ganglion, left wrist: Secondary | ICD-10-CM

## 2014-08-01 DIAGNOSIS — M722 Plantar fascial fibromatosis: Secondary | ICD-10-CM

## 2014-08-01 NOTE — Discharge Instructions (Signed)
Wear splint for wrist swelling and ice and arch supports for foot pain , see your doctor if further problems

## 2014-08-01 NOTE — ED Provider Notes (Signed)
CSN: [id number]     Arrival date & time 08/01/14  1713 History   None    Chief Complaint  Patient presents with  . Wrist Pain  . Foot Pain   (Consider location/radiation/quality/duration/timing/severity/associated sxs/prior Treatment) Patient is a 48 y.o. male presenting with wrist pain and lower extremity pain. The history is provided by the patient.  Wrist Pain This is a new problem. The current episode started more than 2 days ago. The problem has not changed since onset.Pertinent negatives include no chest pain and no abdominal pain.  Foot Pain This is a new problem. The current episode started more than 1 week ago. The problem has been gradually worsening. Pertinent negatives include no chest pain and no abdominal pain.    Past Medical History  Diagnosis Date  . HIV (human immunodeficiency virus infection)   . Polysubstance abuse   . Boils     under arms hx of  . Hemorrhoids   . Herpes labialis   . Psoriasis     hx of  . Achalasia and cardiospasm   . Candidiasis of unspecified site   . Dysphagia   . Asthma   . Candidiasis of mouth   . Fracture, humerus   . Hepatitis 1993    A  . 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 Pilonidal disease 01/10/2012  . Condyloma acuminatum in male of scrotum & anal canal s/p laser ablation 07/03/2012  . 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 Hypertension     off all bp meds for last 9 or 10 months   Past Surgical History  Procedure Laterality Date  . Humeral fracture surgery Right yrs ago  . Right shoulder replacement  2008    x 2  . Esophagogastroduodenoscopy  11/11/2011    Procedure: ESOPHAGOGASTRODUODENOSCOPY (EGD);  Surgeon: Inda Castle, MD;  Location: [facility];  Service: Endoscopy;  Laterality: N/A;  botox injection  called Pt to change time of procedure per Dr Deatra Ina  . Esophagogastroduodenoscopy  03/10/2012    Procedure:  ESOPHAGOGASTRODUODENOSCOPY (EGD);  Surgeon: Inda Castle, MD;  Location: [facility];  Service: Endoscopy;  Laterality: N/A;  . Colonoscopy  05/19/2012    Procedure: COLONOSCOPY;  Surgeon: Inda Castle, MD;  Location: [facility];  Service: Endoscopy;  Laterality: N/A;  jill trying to contact pt to come in 0830 for 930 case, phone not accepting messages  . Esophageal manometry  05/29/2012    Procedure: ESOPHAGEAL MANOMETRY (EM);  Surgeon: Inda Castle, MD;  Location: [facility];  Service: Endoscopy;  Laterality: N/A;  . Pilonidal cyst excision  07/24/2012    Procedure: CYST EXCISION PILONIDAL SIMPLE;  Surgeon: Adin Hector, MD;  Location: [facility];  Service: General;  Laterality: N/A;  Exam Under Anesthesia,, Excision Pilonidal Disease,   . Examination under anesthesia  07/24/2012    Procedure: EXAM UNDER ANESTHESIA;  Surgeon: Adin Hector, MD;  Location: [facility];  Service: General;  Laterality: N/A;  . Wart fulguration  07/24/2012    Procedure: FULGURATION ANAL WART;  Surgeon: Adin Hector, MD;  Location: [facility];  Service: General;  Laterality: N/A;  excision of raphe mass  . Esophagogastroduodenoscopy  10/11/2012    Procedure: ESOPHAGOGASTRODUODENOSCOPY (EGD);  Surgeon: Inda Castle, MD;  Location: [facility];  Service: Endoscopy;  Laterality: N/A;  . Botox injection  10/11/2012    Procedure: BOTOX INJECTION;  Surgeon: Inda Castle,  MD;  Location: [facility];  Service: Endoscopy;  Laterality: N/A;  . Esophagogastroduodenoscopy N/A 01/25/2013    Procedure: ESOPHAGOGASTRODUODENOSCOPY (EGD);  Surgeon: Inda Castle, MD;  Location: [facility];  Service: Endoscopy;  Laterality: N/A;  . Botox injection N/A 01/25/2013    Procedure: BOTOX INJECTION;  Surgeon: Inda Castle, MD;  Location: [facility];  Service: Endoscopy;  Laterality: N/A;  . Esophagogastroduodenoscopy N/A 06/21/2013    Procedure: ESOPHAGOGASTRODUODENOSCOPY (EGD);  Surgeon: Inda Castle, MD;  Location: [facility];  Service: Endoscopy;  Laterality: N/A;  . Botox injection N/A 06/21/2013    Procedure: BOTOX INJECTION;  Surgeon: Inda Castle, MD;  Location: [facility];  Service: Endoscopy;  Laterality: N/A;  . Esophagogastroduodenoscopy (egd) with propofol N/A 10/08/2013    Procedure: ESOPHAGOGASTRODUODENOSCOPY (EGD) WITH PROPOFOL;  Surgeon: Inda Castle, MD;  Location: [facility];  Service: Endoscopy;  Laterality: N/A;  . Botox injection N/A 10/08/2013    Procedure: BOTOX INJECTION;  Surgeon: Inda Castle, MD;  Location: [facility];  Service: Endoscopy;  Laterality: N/A;   Family History  Problem Relation Age of Onset  . Colon cancer Neg Hx   . Stomach cancer Maternal Grandmother     ?  . Diabetes Maternal Grandmother    History  Substance Use Topics  . Smoking status: Current Every Day Smoker -- 0.50 packs/day for 25 years    Types: Cigarettes  . Smokeless tobacco: Never Used     Comment: not ready  . Alcohol Use: No    Review of Systems  Constitutional: Negative.   Cardiovascular: Negative for chest pain.  Gastrointestinal: Negative for abdominal pain.  Musculoskeletal: Positive for gait problem and joint swelling.    Allergies  Review of patient's allergies indicates no known allergies.  Home Medications   Prior to Admission medications   Medication Sig Start Date End Date Taking? Authorizing Provider  cyclobenzaprine (FLEXERIL) 5 MG tablet Take 1 tablet (5 mg total) by mouth 3 (three) times daily as needed for muscle spasms. 06/11/14  Yes Campbell Riches, MD  dicyclomine (BENTYL) 10 MG capsule Take 10 mg by mouth 4 (four) times daily -  before meals and at bedtime.   Yes Historical Provider, MD  diltiazem (CARDIZEM) 60 MG tablet Take 1 tablet (60 mg total) by mouth 4 (four) times daily. 01/04/14  Yes Inda Castle, MD  dolutegravir (TIVICAY) 50 MG tablet Take 50 mg by mouth every evening.   Yes Historical Provider, MD  Emtricitab-Rilpivir-Tenofovir  (COMPLERA) 200-25-300 MG TABS Take 1 tablet by mouth daily.   Yes Historical Provider, MD   BP 123/83  Pulse 83  Temp(Src) 98.5 F (36.9 C) (Oral)  Resp 12  SpO2 100% Physical Exam  Nursing note and vitals reviewed. Constitutional: He is oriented to person, place, and time.  Musculoskeletal: He exhibits tenderness.       Arms:      Feet:  Neurological: He is alert and oriented to person, place, and time.  Skin: Skin is warm and dry.    ED Course  Procedures (including critical care time) Labs Review Labs Reviewed - No data to display  Imaging Review No results found.   MDM   1. Ganglion cyst of wrist, left   2. Plantar fasciitis of left foot        Billy Fischer, MD 08/01/14 1810

## 2014-08-01 NOTE — ED Notes (Signed)
C/O knot on left wrist.  Pain with movement.  Noticed 3 days ago.  Also c/o  Left foot pain at bottom of foot x 1 wk.  Sharp pain.  States would come and go but is gradually staying more constant.     No relief with heating pad or otc pain meds.

## 2014-08-19 ENCOUNTER — Ambulatory Visit: Payer: Medicaid Other | Admitting: Infectious Diseases

## 2014-08-20 ENCOUNTER — Ambulatory Visit: Payer: Medicaid Other | Admitting: Infectious Diseases

## 2014-09-04 ENCOUNTER — Other Ambulatory Visit: Payer: Self-pay | Admitting: Infectious Diseases

## 2014-09-04 ENCOUNTER — Ambulatory Visit (INDEPENDENT_AMBULATORY_CARE_PROVIDER_SITE_OTHER): Payer: Medicaid Other | Admitting: Infectious Diseases

## 2014-09-04 ENCOUNTER — Other Ambulatory Visit (HOSPITAL_COMMUNITY)
Admission: RE | Admit: 2014-09-04 | Discharge: 2014-09-04 | Disposition: A | Discharge status: Home / Self Care | Payer: Medicaid Other | Source: Ambulatory Visit | Attending: Infectious Diseases | Admitting: Infectious Diseases

## 2014-09-04 ENCOUNTER — Encounter: Payer: Self-pay | Admitting: Infectious Diseases

## 2014-09-04 VITALS — BP 124/72 | HR 74 | Temp 98.3°F | Ht 73.0 in | Wt 244.0 lb

## 2014-09-04 DIAGNOSIS — Z23 Encounter for immunization: Secondary | ICD-10-CM | POA: Diagnosis present

## 2014-09-04 DIAGNOSIS — M791 Myalgia, unspecified site: Secondary | ICD-10-CM

## 2014-09-04 DIAGNOSIS — Z113 Encounter for screening for infections with a predominantly sexual mode of transmission: Secondary | ICD-10-CM

## 2014-09-04 DIAGNOSIS — B20 Human immunodeficiency virus [HIV] disease: Secondary | ICD-10-CM

## 2014-09-04 DIAGNOSIS — I10 Essential (primary) hypertension: Secondary | ICD-10-CM

## 2014-09-04 LAB — COMPREHENSIVE METABOLIC PANEL
ALBUMIN: 3.9 g/dL (ref 3.5–5.2)
ALK PHOS: 88 U/L (ref 39–117)
ALT: 18 U/L (ref 0–53)
AST: 20 U/L (ref 0–37)
BILIRUBIN TOTAL: 0.3 mg/dL (ref 0.2–1.2)
BUN: 9 mg/dL (ref 6–23)
CO2: 26 mEq/L (ref 19–32)
Calcium: 8.9 mg/dL (ref 8.4–10.5)
Chloride: 105 mEq/L (ref 96–112)
Creat: 1.12 mg/dL (ref 0.50–1.35)
GLUCOSE: 86 mg/dL (ref 70–99)
POTASSIUM: 3.8 meq/L (ref 3.5–5.3)
SODIUM: 140 meq/L (ref 135–145)
Total Protein: 6.7 g/dL (ref 6.0–8.3)

## 2014-09-04 MED ORDER — DICLOFENAC SODIUM 1 % TD GEL: 2.0000 g | Freq: Two times a day (BID) | TRANSDERMAL | Status: DC

## 2014-09-04 NOTE — Addendum Note (Signed)
Addended by: Landis Gandy on: 09/04/2014 05:23 PM   Modules accepted: Orders

## 2014-09-04 NOTE — Progress Notes (Signed)
   Subjective:    Patient ID: Benjamin Herrera, male    DOB: 08/27/1966, 48 y.o.   MRN: [id number]  HPI 48 yo M with HIV+, anal condyloma, achalasia, HTN. R shoulder pain (previous rod in his shoulder).  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 Was taking TRV/ISN, had DRVr added at his visit 06-09-12. Then changed to tivicay and complera.  At previous appt was complaining of worsening back pain- we attempted to get MRI (of spine and hip to r/o AVN) but this was not authorized by his insurance. He had plain films of lumbar spine that were normal. He contiues to have severe pain in his back and hip (I can barely walk). He was also seen for pain in his L wrist and told he has a cyst in it.  Has been taking muscle relaxer for his back pain which only puts him to sleep. States tylenol and ibuprofen don't work.  Also with plantar fascitis L foot.  No further anal condyloma.   HIV 1 RNA QUANT (copies/mL)  Date Value  04/22/2014 <20  08/06/2013 <20  01/25/2013 <20   CD4 T CELL ABS  Date Value  04/22/2014 320 /uL*  08/06/2013 420 /uL  01/25/2013 290 cmm*     Review of Systems  Constitutional: Negative for appetite change and unexpected weight change.  Gastrointestinal: Negative for diarrhea and constipation.  Genitourinary: Negative for difficulty urinating.  Musculoskeletal: Positive for back pain and arthralgias.       Objective:   Physical Exam  Constitutional: He appears well-developed and well-nourished.  HENT:  Mouth/Throat: No oropharyngeal exudate.  Eyes: EOM are normal. Pupils are equal, round, and reactive to light.  Neck: Neck supple.  Cardiovascular: Normal rate, regular rhythm and normal heart sounds.   Pulmonary/Chest: Effort normal and breath sounds normal.  Abdominal: Soft. Bowel sounds are normal. He exhibits no distension. There is no tenderness.  Musculoskeletal:       Arms: Lymphadenopathy:    He has no cervical adenopathy.          Assessment & Plan:

## 2014-09-04 NOTE — Assessment & Plan Note (Signed)
Will try to get him into PCP 

## 2014-09-04 NOTE — Assessment & Plan Note (Signed)
Doing well on current, atypical, therapy. Given condoms. Will recheck his labs today. Given flu shot.  rtc in 6 months.  Get him PCP.

## 2014-09-04 NOTE — Assessment & Plan Note (Signed)
Doing well, well controlled.

## 2014-09-04 NOTE — Addendum Note (Signed)
Addended byMarlis Edelson on: 09/04/2014 04:45 PM   Modules accepted: Orders

## 2014-09-05 LAB — CBC
HCT: 42.2 % (ref 39.0–52.0)
Hemoglobin: 14 g/dL (ref 13.0–17.0)
MCH: 25.3 pg — AB (ref 26.0–34.0)
MCHC: 33.2 g/dL (ref 30.0–36.0)
MCV: 76.3 fL — ABNORMAL LOW (ref 78.0–100.0)
Platelets: 195 10*3/uL (ref 150–400)
RBC: 5.53 MIL/uL (ref 4.22–5.81)
RDW: 16.1 % — AB (ref 11.5–15.5)
WBC: 6.2 10*3/uL (ref 4.0–10.5)

## 2014-09-05 LAB — HIV-1 RNA QUANT-NO REFLEX-BLD: HIV 1 RNA Quant: <20 copies/mL (ref <20)

## 2014-09-05 LAB — RPR

## 2014-09-06 LAB — URINE CYTOLOGY ANCILLARY ONLY
Chlamydia: NEGATIVE
NEISSERIA GONORRHEA: NEGATIVE

## 2014-09-06 LAB — T-HELPER CELL (CD4) - (RCID CLINIC ONLY)
CD4 T CELL ABS: 410 /uL (ref 400–2700)
CD4 T CELL HELPER: 17 % — AB (ref 33–55)

## 2014-09-16 ENCOUNTER — Telehealth: Payer: Self-pay | Admitting: *Deleted

## 2014-09-16 NOTE — Telephone Encounter (Signed)
Patient called and advised he is waiting on appt that were to be scheduled per his last office visit. Advised the patient nothing in the system and I will check with nurse and have her call him with information asap.

## 2014-09-18 ENCOUNTER — Telehealth: Payer: Self-pay | Admitting: *Deleted

## 2014-09-18 NOTE — Telephone Encounter (Signed)
Emailed referral request to [facility] for PCP services.

## 2014-09-18 NOTE — Telephone Encounter (Signed)
I haven't heard back yet from [facility], sent request 09/05/14.  Will send another email inquiry today.

## 2014-09-23 NOTE — Telephone Encounter (Signed)
[facility] unable to see patient until late January.  Patient will call [employer] for PCP care.

## 2014-09-26 ENCOUNTER — Other Ambulatory Visit: Payer: Self-pay | Admitting: Infectious Diseases

## 2014-09-26 DIAGNOSIS — B20 Human immunodeficiency virus [HIV] disease: Secondary | ICD-10-CM

## 2014-09-30 ENCOUNTER — Other Ambulatory Visit: Payer: Self-pay | Admitting: Gastroenterology

## 2014-10-07 ENCOUNTER — Ambulatory Visit: Payer: Medicaid Other | Attending: Family Medicine | Admitting: Family Medicine

## 2014-10-07 ENCOUNTER — Encounter: Payer: Self-pay | Admitting: Family Medicine

## 2014-10-07 ENCOUNTER — Ambulatory Visit (INDEPENDENT_AMBULATORY_CARE_PROVIDER_SITE_OTHER): Payer: Medicaid Other | Admitting: Licensed Clinical Social Worker

## 2014-10-07 ENCOUNTER — Ambulatory Visit: Payer: Medicaid Other

## 2014-10-07 VITALS — BP 121/74 | HR 78 | Temp 98.4°F | Resp 18 | Ht 73.0 in | Wt 245.0 lb

## 2014-10-07 DIAGNOSIS — M5126 Other intervertebral disc displacement, lumbar region: Secondary | ICD-10-CM | POA: Insufficient documentation

## 2014-10-07 DIAGNOSIS — R0683 Snoring: Secondary | ICD-10-CM

## 2014-10-07 DIAGNOSIS — B20 Human immunodeficiency virus [HIV] disease: Secondary | ICD-10-CM | POA: Diagnosis present

## 2014-10-07 DIAGNOSIS — F17200 Nicotine dependence, unspecified, uncomplicated: Secondary | ICD-10-CM | POA: Diagnosis not present

## 2014-10-07 DIAGNOSIS — R55 Syncope and collapse: Secondary | ICD-10-CM | POA: Diagnosis not present

## 2014-10-07 DIAGNOSIS — M545 Low back pain: Secondary | ICD-10-CM | POA: Diagnosis present

## 2014-10-07 DIAGNOSIS — M5136 Other intervertebral disc degeneration, lumbar region: Secondary | ICD-10-CM | POA: Insufficient documentation

## 2014-10-07 DIAGNOSIS — G4730 Sleep apnea, unspecified: Secondary | ICD-10-CM | POA: Insufficient documentation

## 2014-10-07 DIAGNOSIS — M51369 Other intervertebral disc degeneration, lumbar region without mention of lumbar back pain or lower extremity pain: Secondary | ICD-10-CM | POA: Insufficient documentation

## 2014-10-07 DIAGNOSIS — M79604 Pain in right leg: Secondary | ICD-10-CM

## 2014-10-07 DIAGNOSIS — Z21 Asymptomatic human immunodeficiency virus [HIV] infection status: Secondary | ICD-10-CM | POA: Insufficient documentation

## 2014-10-07 DIAGNOSIS — Z23 Encounter for immunization: Secondary | ICD-10-CM | POA: Diagnosis not present

## 2014-10-07 LAB — URIC ACID: Uric Acid, Serum: 7.1 mg/dL (ref 4.0–7.8)

## 2014-10-07 MED ORDER — ACETAMINOPHEN-CODEINE #3 300-30 MG PO TABS: 1.0000 | ORAL_TABLET | Freq: Three times a day (TID) | ORAL | Status: DC | PRN

## 2014-10-07 MED ORDER — MELOXICAM 7.5 MG PO TABS: 7.5000 mg | ORAL_TABLET | Freq: Every day | ORAL | Status: DC

## 2014-10-07 NOTE — Patient Instructions (Signed)
Mr. Parke,  Thank you for coming in today. It was a pleasure meeting you. I look forward to being your primary doctor.   1. Back pain:  MRI ordered Start mobic 7.5 mg daily with food, do not mix with other NSAIDs (aleve, advil, naproxen etc.) Tylenol #3 for severe pain. We will consider Neurontin.   2. Snoring: sleep study ordered. The sleep center will call you.   F/u in 4 weeks to review MRI f/u response to the above therapy  Dr. Adrian Blackwater

## 2014-10-07 NOTE — Progress Notes (Signed)
Establish Care Complaining of RT hip, back, Rt shoulder and Lt foot Hx Surgery on Rt arm and shoulder

## 2014-10-07 NOTE — Assessment & Plan Note (Signed)
A: chronic low back pain suspect lumbar radiculopathy P: mobic 7.5 mg (did not give max dose in order to limit risk of AKI) Tylenol #3 MRI of low back

## 2014-10-07 NOTE — Assessment & Plan Note (Signed)
A: patient with syncope episode in the setting of retching. Suspect vasovagal syncope. Reviewed labs from 11/15, no anemia or electrolyte abnormality. ? Etiology of retching (GERD vs. Sleep apnea) P:  Advised GERD diet Sleep study

## 2014-10-07 NOTE — Progress Notes (Signed)
   Subjective:    Patient ID: Benjamin Herrera, male    DOB: 01/14/1966, 48 y.o.   MRN: [id number] CC: R shoulder, lower back both sides, R hip, L foot , L wrist  HPI 48 yo M new patient presents to establish care and discuss the following:  1.  b/l low back and R hip pain: back x 8 months. Worsening x 3-4 months. Pain radiates down to R lateral hip. Some weakness on R side. No falls. Pain dose impair gait. No fecal or urinary incontinence. No groin numbness. No inciting trauma. No fever chills, weight loss. Has taking NSAIDs, ibuprofen for pain w/o relief.   2. Syncope: 4 weeks ago. Patient woke up retching. Went to the bathroom and found himself on the floor with his wicker laundry basket knocked over and the towel rack partially down. No tongue biting or fecal/urinary incontinence. Patient has not had recurrent syncope. He has had recurrent early AM episodes of retching. He does snore.   Soc Hx:  Smoking 1/2 PPD, THC Med Hx: HIV on HAART Surg Hx: R shoulder shoulder  Review of Systems As per HPI     Objective:   Physical Exam BP 121/74 mmHg  Pulse 78  Temp(Src) 98.4 F (36.9 C) (Oral)  Resp 18  Ht 6\' 1"  (1.854 m)  Wt 245 lb (111.131 kg)  BMI 32.33 kg/m2  SpO2 98% General appearance: alert, cooperative and no distress  Back Exam: Back: Normal Curvature, no deformities or CVA tenderness  Paraspinal Tenderness: R L4 to L2   LE Strength 5/5  LE Sensation: in tact  LE Reflexes 2+ and symmetric  Straight leg raise: negative on the R the straight L does exacerbate pain to the R hip FABER: negative  Log roll: negative Extremities: extremities normal, atraumatic, no cyanosis or edema  Foot: collapsed medial and longitudinal arch both feet      Assessment & Plan:

## 2014-10-14 ENCOUNTER — Telehealth: Payer: Self-pay | Admitting: *Deleted

## 2014-10-14 NOTE — Telephone Encounter (Signed)
-----   Message from Minerva Ends, MD sent at 10/08/2014  2:30 PM EST ----- Normal uric acid level

## 2014-10-14 NOTE — Telephone Encounter (Signed)
Pt aware of lab results 

## 2014-10-24 ENCOUNTER — Ambulatory Visit (HOSPITAL_COMMUNITY)
Admission: RE | Admit: 2014-10-24 | Discharge: 2014-10-24 | Disposition: A | Discharge status: Home / Self Care | Payer: Medicaid Other | Source: Ambulatory Visit | Attending: Family Medicine | Admitting: Family Medicine

## 2014-10-24 DIAGNOSIS — M545 Low back pain: Secondary | ICD-10-CM | POA: Insufficient documentation

## 2014-10-24 DIAGNOSIS — M79604 Pain in right leg: Secondary | ICD-10-CM

## 2014-10-30 ENCOUNTER — Telehealth: Payer: Self-pay | Admitting: *Deleted

## 2014-10-30 ENCOUNTER — Other Ambulatory Visit: Payer: Self-pay | Admitting: Family Medicine

## 2014-10-30 DIAGNOSIS — M5126 Other intervertebral disc displacement, lumbar region: Secondary | ICD-10-CM

## 2014-10-30 NOTE — Assessment & Plan Note (Signed)
MRI reveals disc protrusion in low back at L4-L5 and L5-S1. Plan for PT and referral to neurosurgery

## 2014-10-30 NOTE — Telephone Encounter (Signed)
Pt aware of MRI results. Notified of referral

## 2014-10-30 NOTE — Telephone Encounter (Signed)
-----   Message from Minerva Ends, MD sent at 10/30/2014  1:03 PM EST ----- MRI reveals disc protrusion in low back at L4-L5 and L5-S1. Plan for PT and referral to neurosurgery

## 2014-11-27 ENCOUNTER — Ambulatory Visit: Payer: Medicaid Other | Admitting: Gastroenterology

## 2014-12-25 ENCOUNTER — Ambulatory Visit (HOSPITAL_BASED_OUTPATIENT_CLINIC_OR_DEPARTMENT_OTHER): Payer: Medicaid Other | Attending: Family Medicine | Admitting: Radiology

## 2014-12-25 VITALS — Ht 73.0 in | Wt 234.0 lb

## 2014-12-25 DIAGNOSIS — R0683 Snoring: Secondary | ICD-10-CM | POA: Diagnosis present

## 2014-12-29 ENCOUNTER — Ambulatory Visit (HOSPITAL_BASED_OUTPATIENT_CLINIC_OR_DEPARTMENT_OTHER): Payer: Medicaid Other | Admitting: Internal Medicine

## 2014-12-29 DIAGNOSIS — R0683 Snoring: Secondary | ICD-10-CM

## 2014-12-29 NOTE — Sleep Study (Signed)
   NAME: Benjamin Herrera DATE OF BIRTH:  [birth year] MEDICAL RECORD NUMBER [id number]  LOCATION: [facility]  PHYSICIAN: Merrin Mcvicker D  DATE OF STUDY: 12/25/2014  SLEEP STUDY TYPE: Nocturnal Polysomnogram               REFERRING PHYSICIAN: Funches, Josalyn C, MD  INDICATION FOR STUDY: Hypersomnia with sleep apnea  EPWORTH SLEEPINESS SCORE:   15/24 HEIGHT: 6\' 1"  (185.4 cm)  WEIGHT: 106.142 kg (234 lb)    Body mass index is 30.88 kg/(m^2).  NECK SIZE: 17 in.  MEDICATIONS: Charted for review  SLEEP ARCHITECTURE: Total sleep time 236 minutes with sleep efficiency 66.4%. Stage I was 41.1%, stage II 56.6%, stage III absent, REM 2.3% of total sleep time. Sleep latency 19 minutes, REM latency 156 minutes awake after sleep onset 97.5 minutes arousal index 66.9. Very frequent brief awakenings with sleep fragmentation throughout the night bedtime medication: None  RESPIRATORY DATA: Apnea hypopnea index (AHI) 9.7 per hour. 38 total events scored, all as hypopneas and mostly while supine. REM AHI 0. There were not enough early events to permit application of split protocol CPAP titration  OXYGEN DATA: Moderately loud snoring with oxygen desaturation to a nadir of 90% and mean saturation 94.1% on room air  CARDIAC DATA: Sinus rhythm with PACs  MOVEMENT/PARASOMNIA: Periodic limb movement. 118 limb jerks were counted of which 22 were associated with arousal or awakening for a periodic limb movement with arousal index of 5.6 per hour. Bathroom 2.  IMPRESSION/ RECOMMENDATION:   1) Mild obstructive sleep apnea/hypopnea syndrome, AHI 9.7 per hour. Mostly supine hypopneas. REM AHI 0. Moderately loud snoring with oxygen desaturation to a nadir of 90% and mean saturation 94.1% on room air. 2) There were not enough respiratory events to permit application of split protocol CPAP titration. Scores in this range might respond to conservative management including weight loss and treatment  for nasal congestion. On an individual basis, CPAP or a mandibular advancement oral device might be appropriate.  3) Periodic limb movement with arousal syndrome. 118 total limb jerks of which 22 were associated with arousal or awakening for periodic limb movement with arousal index of 5.6 per hour. If this pattern persists in the home environment then a trial specific therapy such as Requip or Mirapex might be considered. 4) The patient complained that he wakes up gagging and "when I get up to go vomit I blackout`" Consider uncontrolled reflux and vomiting- related  vasovagal syncope in this context.  5) The patient had difficulty maintaining sleep, with frequent brief awakenings fragmenting sleep throughout the night. He complained to the technician that his shoulder hurt and the bed was uncomfortable for him.  Deneise Lever Diplomate, American Board of Sleep Medicine  ELECTRONICALLY SIGNED ON:  12/29/2014, 4:59 PM [place] PH: [phone]   FX: [phone] [place]

## 2015-01-08 ENCOUNTER — Telehealth: Payer: Self-pay | Admitting: Family Medicine

## 2015-01-08 NOTE — Telephone Encounter (Signed)
Patient called to request the results of his sleep study and  the machine he uses to sleep. Please f/u with pt.

## 2015-01-09 ENCOUNTER — Telehealth: Payer: Self-pay | Admitting: Family Medicine

## 2015-01-09 DIAGNOSIS — K219 Gastro-esophageal reflux disease without esophagitis: Secondary | ICD-10-CM

## 2015-01-09 NOTE — Telephone Encounter (Signed)
Patient is calling to request sleep study results, patient is also stating that he needs the machine to sleep. Please f/u with pt.

## 2015-01-10 DIAGNOSIS — K219 Gastro-esophageal reflux disease without esophagitis: Secondary | ICD-10-CM | POA: Insufficient documentation

## 2015-01-10 MED ORDER — SUCRALFATE 1 G PO TABS: 1.0000 g | ORAL_TABLET | Freq: Three times a day (TID) | ORAL | Status: DC

## 2015-01-10 NOTE — Assessment & Plan Note (Signed)
GERD- needs treatment limited as patient cannot take PPI or H2 blocke with HIV meds. Carafate and GERD diet recommended.

## 2015-01-10 NOTE — Addendum Note (Signed)
Addended by: Boykin Nearing on: 01/10/2015 02:30 PM   Modules accepted: Orders

## 2015-01-10 NOTE — Telephone Encounter (Signed)
Mild sleep apnea- no need for CPAP GERD- needs treatment limited as patient cannot take PPI or H2 blocke with HIV meds. Carafate and GERD diet recommended.

## 2015-01-10 NOTE — Telephone Encounter (Signed)
Mild sleep apnea No need for CPAP GERD- need for protonix

## 2015-01-13 ENCOUNTER — Telehealth: Payer: Self-pay | Admitting: Family Medicine

## 2015-01-13 NOTE — Telephone Encounter (Signed)
Junious Dresser, Please see phone note from 01/10/15. I sent you a message

## 2015-01-13 NOTE — Telephone Encounter (Signed)
Patient is calling in to get the results of his sleep study; please f/u with patient, he is not sure what he needs to do next

## 2015-01-13 NOTE — Telephone Encounter (Signed)
Pt aware of results 

## 2015-02-17 ENCOUNTER — Encounter (HOSPITAL_COMMUNITY): Payer: Self-pay

## 2015-02-17 ENCOUNTER — Emergency Department (HOSPITAL_COMMUNITY)
Admission: EM | Admit: 2015-02-17 | Discharge: 2015-02-17 | Disposition: A | Discharge status: Home / Self Care | Payer: Medicaid Other | Attending: Emergency Medicine | Admitting: Emergency Medicine

## 2015-02-17 ENCOUNTER — Emergency Department (HOSPITAL_COMMUNITY): Payer: Medicaid Other

## 2015-02-17 DIAGNOSIS — Z8719 Personal history of other diseases of the digestive system: Secondary | ICD-10-CM | POA: Diagnosis not present

## 2015-02-17 DIAGNOSIS — Z85828 Personal history of other malignant neoplasm of skin: Secondary | ICD-10-CM | POA: Insufficient documentation

## 2015-02-17 DIAGNOSIS — R1013 Epigastric pain: Secondary | ICD-10-CM | POA: Diagnosis not present

## 2015-02-17 DIAGNOSIS — Z8781 Personal history of (healed) traumatic fracture: Secondary | ICD-10-CM | POA: Insufficient documentation

## 2015-02-17 DIAGNOSIS — E663 Overweight: Secondary | ICD-10-CM | POA: Insufficient documentation

## 2015-02-17 DIAGNOSIS — Z79899 Other long term (current) drug therapy: Secondary | ICD-10-CM | POA: Diagnosis not present

## 2015-02-17 DIAGNOSIS — I10 Essential (primary) hypertension: Secondary | ICD-10-CM | POA: Diagnosis not present

## 2015-02-17 DIAGNOSIS — R1033 Periumbilical pain: Secondary | ICD-10-CM | POA: Diagnosis not present

## 2015-02-17 DIAGNOSIS — R197 Diarrhea, unspecified: Secondary | ICD-10-CM | POA: Insufficient documentation

## 2015-02-17 DIAGNOSIS — J45909 Unspecified asthma, uncomplicated: Secondary | ICD-10-CM | POA: Diagnosis not present

## 2015-02-17 DIAGNOSIS — Z720 Tobacco use: Secondary | ICD-10-CM | POA: Diagnosis not present

## 2015-02-17 DIAGNOSIS — Z872 Personal history of diseases of the skin and subcutaneous tissue: Secondary | ICD-10-CM | POA: Diagnosis not present

## 2015-02-17 DIAGNOSIS — R110 Nausea: Secondary | ICD-10-CM | POA: Insufficient documentation

## 2015-02-17 DIAGNOSIS — B20 Human immunodeficiency virus [HIV] disease: Secondary | ICD-10-CM | POA: Insufficient documentation

## 2015-02-17 DIAGNOSIS — Z791 Long term (current) use of non-steroidal anti-inflammatories (NSAID): Secondary | ICD-10-CM | POA: Diagnosis not present

## 2015-02-17 LAB — COMPREHENSIVE METABOLIC PANEL
ALT: 20 U/L (ref 0–53)
AST: 22 U/L (ref 0–37)
Albumin: 4.2 g/dL (ref 3.5–5.2)
Alkaline Phosphatase: 100 U/L (ref 39–117)
Anion gap: 6 (ref 5–15)
BILIRUBIN TOTAL: 0.5 mg/dL (ref 0.3–1.2)
BUN: 15 mg/dL (ref 6–23)
CO2: 25 mmol/L (ref 19–32)
CREATININE: 1.31 mg/dL (ref 0.50–1.35)
Calcium: 9.1 mg/dL (ref 8.4–10.5)
Chloride: 108 mmol/L (ref 96–112)
GFR, EST AFRICAN AMERICAN: 73 mL/min — AB (ref >90)
GFR, EST NON AFRICAN AMERICAN: 63 mL/min — AB (ref >90)
Glucose, Bld: 93 mg/dL (ref 70–99)
POTASSIUM: 3.9 mmol/L (ref 3.5–5.1)
SODIUM: 139 mmol/L (ref 135–145)
Total Protein: 7.8 g/dL (ref 6.0–8.3)

## 2015-02-17 LAB — CBC WITH DIFFERENTIAL/PLATELET
Basophils Absolute: 0 10*3/uL (ref 0.0–0.1)
Basophils Relative: 1 % (ref 0–1)
Eosinophils Absolute: 0.2 10*3/uL (ref 0.0–0.7)
Eosinophils Relative: 2 % (ref 0–5)
HCT: 46.9 % (ref 39.0–52.0)
HEMOGLOBIN: 15.7 g/dL (ref 13.0–17.0)
Lymphocytes Relative: 53 % — ABNORMAL HIGH (ref 12–46)
Lymphs Abs: 3.4 10*3/uL (ref 0.7–4.0)
MCH: 26.2 pg (ref 26.0–34.0)
MCHC: 33.5 g/dL (ref 30.0–36.0)
MCV: 78.2 fL (ref 78.0–100.0)
MONOS PCT: 6 % (ref 3–12)
Monocytes Absolute: 0.4 10*3/uL (ref 0.1–1.0)
NEUTROS ABS: 2.4 10*3/uL (ref 1.7–7.7)
Neutrophils Relative %: 38 % — ABNORMAL LOW (ref 43–77)
Platelets: 174 10*3/uL (ref 150–400)
RBC: 6 MIL/uL — ABNORMAL HIGH (ref 4.22–5.81)
RDW: 15.9 % — ABNORMAL HIGH (ref 11.5–15.5)
WBC: 6.4 10*3/uL (ref 4.0–10.5)

## 2015-02-17 LAB — URINALYSIS, ROUTINE W REFLEX MICROSCOPIC
Bilirubin Urine: NEGATIVE
GLUCOSE, UA: NEGATIVE mg/dL
Ketones, ur: NEGATIVE mg/dL
Leukocytes, UA: NEGATIVE
Nitrite: NEGATIVE
PROTEIN: NEGATIVE mg/dL
SPECIFIC GRAVITY, URINE: 1.033 — AB (ref 1.005–1.030)
Urobilinogen, UA: 0.2 mg/dL (ref 0.0–1.0)
pH: 5.5 (ref 5.0–8.0)

## 2015-02-17 LAB — LIPASE, BLOOD: Lipase: 37 U/L (ref 11–59)

## 2015-02-17 LAB — URINE MICROSCOPIC-ADD ON

## 2015-02-17 MED ORDER — ONDANSETRON HCL 4 MG/2ML IJ SOLN
4.0000 mg | Freq: Once | INTRAMUSCULAR | Status: AC
  Administered 2015-02-17: 4 mg via INTRAVENOUS
  Filled 2015-02-17: qty 2

## 2015-02-17 MED ORDER — RANITIDINE HCL 150 MG PO CAPS: 300.0000 mg | ORAL_CAPSULE | Freq: Every day | ORAL | Status: DC

## 2015-02-17 MED ORDER — IOHEXOL 300 MG/ML  SOLN
50.0000 mL | Freq: Once | INTRAMUSCULAR | Status: AC | PRN
  Administered 2015-02-17: 50 mL via ORAL

## 2015-02-17 MED ORDER — MORPHINE SULFATE 4 MG/ML IJ SOLN
4.0000 mg | Freq: Once | INTRAMUSCULAR | Status: AC
  Administered 2015-02-17: 4 mg via INTRAVENOUS
  Filled 2015-02-17: qty 1

## 2015-02-17 MED ORDER — GI COCKTAIL ~~LOC~~
30.0000 mL | Freq: Once | ORAL | Status: AC
Start: 2015-02-17 — End: 2015-02-17
  Administered 2015-02-17: 30 mL via ORAL
  Filled 2015-02-17: qty 30

## 2015-02-17 MED ORDER — OMEPRAZOLE 20 MG PO CPDR: 20.0000 mg | DELAYED_RELEASE_CAPSULE | Freq: Every day | ORAL | Status: DC

## 2015-02-17 MED ORDER — IOHEXOL 300 MG/ML  SOLN
100.0000 mL | Freq: Once | INTRAMUSCULAR | Status: AC | PRN
  Administered 2015-02-17: 100 mL via INTRAVENOUS

## 2015-02-17 MED ORDER — PANTOPRAZOLE SODIUM 40 MG IV SOLR
40.0000 mg | Freq: Once | INTRAVENOUS | Status: AC
  Administered 2015-02-17: 40 mg via INTRAVENOUS
  Filled 2015-02-17: qty 40

## 2015-02-17 MED ORDER — HYDROCODONE-ACETAMINOPHEN 5-325 MG PO TABS: 1.0000 | ORAL_TABLET | Freq: Four times a day (QID) | ORAL | Status: DC | PRN

## 2015-02-17 MED ORDER — ARTIFICIAL TEARS OP OINT
TOPICAL_OINTMENT | Freq: Once | OPHTHALMIC | Status: DC
  Filled 2015-02-17: qty 3.5

## 2015-02-17 NOTE — ED Notes (Signed)
Patient transported to CT 

## 2015-02-17 NOTE — ED Notes (Signed)
Pt presents with c/o abdominal pain that started around 4:30 this morning. Pt reports he has not vomited but feels nauseated and has had 2 episodes of diarrhea since the pain started. Pt reports the pain is in the center of his stomach.

## 2015-02-17 NOTE — Discharge Instructions (Signed)
You were seen today for abdominal pain. Her CT scan is negative. Your pain may be related to reflux or peptic ulcer disease. Because of your antiretrovirals, you cannot take proton pump inhibitors. He will be discharged with ranitidine and a short course of pain medication. You need to follow-up with your primary physician.  Abdominal Pain Many things can cause abdominal pain. Usually, abdominal pain is not caused by a disease and will improve without treatment. It can often be observed and treated at home. Your health care provider will do a physical exam and possibly order blood tests and X-rays to help determine the seriousness of your pain. However, in many cases, more time must pass before a clear cause of the pain can be found. Before that point, your health care provider may not know if you need more testing or further treatment. HOME CARE INSTRUCTIONS  Monitor your abdominal pain for any changes. The following actions may help to alleviate any discomfort you are experiencing:  Only take over-the-counter or prescription medicines as directed by your health care provider.  Do not take laxatives unless directed to do so by your health care provider.  Try a clear liquid diet (broth, tea, or water) as directed by your health care provider. Slowly move to a bland diet as tolerated. SEEK MEDICAL CARE IF:  You have unexplained abdominal pain.  You have abdominal pain associated with nausea or diarrhea.  You have pain when you urinate or have a bowel movement.  You experience abdominal pain that wakes you in the night.  You have abdominal pain that is worsened or improved by eating food.  You have abdominal pain that is worsened with eating fatty foods.  You have a fever. SEEK IMMEDIATE MEDICAL CARE IF:   Your pain does not go away within 2 hours.  You keep throwing up (vomiting).  Your pain is felt only in portions of the abdomen, such as the right side or the left lower portion of  the abdomen.  You pass bloody or black tarry stools. MAKE SURE YOU:  Understand these instructions.   Will watch your condition.   Will get help right away if you are not doing well or get worse.  Document Released: 07/21/2005 Document Revised: 10/16/2013 Document Reviewed: 06/20/2013 [facility] Patient Information 2015 [place], [place]. This information is not intended to replace advice given to you by your health care provider. Make sure you discuss any questions you have with your health care provider.

## 2015-02-17 NOTE — ED Provider Notes (Addendum)
CSN: [id number]     Arrival date & time 02/17/15  9735 History   First MD Initiated Contact with Patient 02/17/15 [phone]     Chief Complaint  Patient presents with  . Abdominal Pain     (Consider location/radiation/quality/duration/timing/severity/associated sxs/prior Treatment) HPI  This is a 49 year old male with a history of HIV, hypertension, achalasia who presents with abdominal pain. Reports onset of pain this morning at approximately 1:30 AM. Reports sharp area umbilical pain that radiates upward. He comes and goes. At its worst it is 8 out of 10. He reports associated nausea without vomiting. He has had 2 episodes of non-bloody diarrhea. Denies any fevers, chest pain, or shortness of breath.  Past Medical History  Diagnosis Date  . Polysubstance abuse   . Boils     under arms hx of  . Hemorrhoids   . Herpes labialis   . Psoriasis     hx of  . Achalasia and cardiospasm   . Candidiasis of unspecified site   . Dysphagia   . Asthma   . Candidiasis of mouth   . Fracture, humerus   . Hepatitis 1993    A  . 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 Pilonidal disease 01/10/2012  . Condyloma acuminatum in male of scrotum & anal canal s/p laser ablation 07/03/2012  . 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 HIV (human immunodeficiency virus infection) DX 1993  . Hypertension Dx 2014    off all bp meds for last 9 or 10 months   Past Surgical History  Procedure Laterality Date  . Humeral fracture surgery Right yrs ago  . Right shoulder replacement  2008    x 2  . Esophagogastroduodenoscopy  11/11/2011    Procedure: ESOPHAGOGASTRODUODENOSCOPY (EGD);  Surgeon: Inda Castle, MD;  Location: [facility];  Service: Endoscopy;  Laterality: N/A;  botox injection  called Pt to change time of procedure per Dr Deatra Ina  . Esophagogastroduodenoscopy  03/10/2012    Procedure: ESOPHAGOGASTRODUODENOSCOPY (EGD);   Surgeon: Inda Castle, MD;  Location: [facility];  Service: Endoscopy;  Laterality: N/A;  . Colonoscopy  05/19/2012    Procedure: COLONOSCOPY;  Surgeon: Inda Castle, MD;  Location: [facility];  Service: Endoscopy;  Laterality: N/A;  jill trying to contact pt to come in 0830 for 930 case, phone not accepting messages  . Esophageal manometry  05/29/2012    Procedure: ESOPHAGEAL MANOMETRY (EM);  Surgeon: Inda Castle, MD;  Location: [facility];  Service: Endoscopy;  Laterality: N/A;  . Pilonidal cyst excision  07/24/2012    Procedure: CYST EXCISION PILONIDAL SIMPLE;  Surgeon: Adin Hector, MD;  Location: [facility];  Service: General;  Laterality: N/A;  Exam Under Anesthesia,, Excision Pilonidal Disease,   . Examination under anesthesia  07/24/2012    Procedure: EXAM UNDER ANESTHESIA;  Surgeon: Adin Hector, MD;  Location: [facility];  Service: General;  Laterality: N/A;  . Wart fulguration  07/24/2012    Procedure: FULGURATION ANAL WART;  Surgeon: Adin Hector, MD;  Location: [facility];  Service: General;  Laterality: N/A;  excision of raphe mass  . Esophagogastroduodenoscopy  10/11/2012    Procedure: ESOPHAGOGASTRODUODENOSCOPY (EGD);  Surgeon: Inda Castle, MD;  Location: [facility];  Service: Endoscopy;  Laterality: N/A;  . Botox injection  10/11/2012    Procedure: BOTOX INJECTION;  Surgeon: Inda Castle, MD;  Location: [facility];  Service: Endoscopy;  Laterality: N/A;  . Esophagogastroduodenoscopy N/A 01/25/2013    Procedure: ESOPHAGOGASTRODUODENOSCOPY (EGD);  Surgeon: Inda Castle, MD;  Location: [facility];  Service: Endoscopy;  Laterality: N/A;  . Botox injection N/A 01/25/2013    Procedure: BOTOX INJECTION;  Surgeon: Inda Castle, MD;  Location: [facility];  Service: Endoscopy;  Laterality: N/A;  . Esophagogastroduodenoscopy N/A 06/21/2013    Procedure: ESOPHAGOGASTRODUODENOSCOPY (EGD);  Surgeon: Inda Castle, MD;  Location: [facility];  Service: Endoscopy;   Laterality: N/A;  . Botox injection N/A 06/21/2013    Procedure: BOTOX INJECTION;  Surgeon: Inda Castle, MD;  Location: [facility];  Service: Endoscopy;  Laterality: N/A;  . Esophagogastroduodenoscopy (egd) with propofol N/A 10/08/2013    Procedure: ESOPHAGOGASTRODUODENOSCOPY (EGD) WITH PROPOFOL;  Surgeon: Inda Castle, MD;  Location: [facility];  Service: Endoscopy;  Laterality: N/A;  . Botox injection N/A 10/08/2013    Procedure: BOTOX INJECTION;  Surgeon: Inda Castle, MD;  Location: [facility];  Service: Endoscopy;  Laterality: N/A;   Family History  Problem Relation Age of Onset  . Colon cancer Neg Hx   . Stomach cancer Maternal Grandmother     ?  . Diabetes Maternal Grandmother   . Arthritis Mother   . Hypertension Mother   . Heart disease Mother    History  Substance Use Topics  . Smoking status: Current Every Day Smoker -- 0.50 packs/day for 25 years    Types: Cigarettes  . Smokeless tobacco: Never Used     Comment: not ready  . Alcohol Use: No    Review of Systems  Constitutional: Negative.  Negative for fever.  Respiratory: Negative.  Negative for chest tightness and shortness of breath.   Cardiovascular: Negative.  Negative for chest pain.  Gastrointestinal: Positive for nausea, abdominal pain and diarrhea. Negative for vomiting.  Genitourinary: Negative.  Negative for dysuria.  Musculoskeletal: Negative for back pain.  Skin: Negative for rash.  Neurological: Negative for headaches.  All other systems reviewed and are negative.     Allergies  Review of patient's allergies indicates no known allergies.  Home Medications   Prior to Admission medications   Medication Sig Start Date End Date Taking? Authorizing Provider  acetaminophen-codeine (TYLENOL #3) 300-30 MG per tablet Take 1-2 tablets by mouth every 8 (eight) hours as needed for moderate pain. 10/07/14  Yes Josalyn Funches, MD  diclofenac sodium (VOLTAREN) 1 % GEL Apply 2 g topically 2  (two) times daily. 09/04/14  Yes Campbell Riches, MD  diltiazem (CARDIZEM) 60 MG tablet TAKE 1 TABLET (60 MG TOTAL) BY MOUTH 4 (FOUR) TIMES DAILY. 10/01/14  Yes Inda Castle, MD  Emtricitab-Rilpivir-Tenofovir (COMPLERA) 200-25-300 MG TABS Take 1 tablet by mouth daily.   Yes Historical Provider, MD  Naphazoline-Pheniramine (OPCON-A OP) Apply 1-2 drops to eye 3 (three) times daily as needed (dry eyes.).   Yes Historical Provider, MD  TIVICAY 50 MG tablet TAKE 1 TABLET (50 MG TOTAL) BY MOUTH DAILY. 09/26/14  Yes Campbell Riches, MD  meloxicam (MOBIC) 7.5 MG tablet Take 1 tablet (7.5 mg total) by mouth daily. Patient not taking: Reported on 02/17/2015 10/07/14   Josalyn Funches, MD   BP 121/61 mmHg  Pulse 88  Temp(Src) 98.2 F (36.8 C) (Oral)  Resp 18  Ht 6\' 1"  (1.854 m)  Wt 235 lb (106.595 kg)  BMI 31.01 kg/m2  SpO2 95% Physical Exam  Constitutional: He is oriented to person, place, and time. He appears well-developed and well-nourished.  Overweight  HENT:  Head: Normocephalic and atraumatic.  Cardiovascular: Normal rate, regular rhythm and normal heart sounds.   No murmur heard. Pulmonary/Chest: Effort normal and breath sounds normal. No respiratory distress. He has no wheezes.  Abdominal: Soft. Bowel sounds are normal. There is tenderness. There is no rebound and no guarding.  Periumbilical and epigastric tenderness to palpation without rebound or guarding  Musculoskeletal: He exhibits no edema.  Neurological: He is alert and oriented to person, place, and time.  Skin: Skin is warm and dry.  Psychiatric: He has a normal mood and affect.  Nursing note and vitals reviewed.   ED Course  Procedures (including critical care time) Labs Review Labs Reviewed  CBC WITH DIFFERENTIAL/PLATELET - Abnormal; Notable for the following:    RBC 6.00 (*)    RDW 15.9 (*)    Neutrophils Relative % 38 (*)    Lymphocytes Relative 53 (*)    All other components within normal limits   COMPREHENSIVE METABOLIC PANEL - Abnormal; Notable for the following:    GFR calc non Af Amer 63 (*)    GFR calc Af Amer 73 (*)    All other components within normal limits  URINALYSIS, ROUTINE W REFLEX MICROSCOPIC - Abnormal; Notable for the following:    Specific Gravity, Urine 1.033 (*)    Hgb urine dipstick SMALL (*)    All other components within normal limits  LIPASE, BLOOD  URINE MICROSCOPIC-ADD ON    Imaging Review Ct Abdomen Pelvis W Contrast  02/17/2015   CLINICAL DATA:  Abdominal pain beginning 4:30 a.m. this morning. No known injury.  EXAM: CT ABDOMEN AND PELVIS WITH CONTRAST  TECHNIQUE: Multidetector CT imaging of the abdomen and pelvis was performed using the standard protocol following bolus administration of intravenous contrast.  CONTRAST:  50 mL OMNIPAQUE IOHEXOL 300 MG/ML SOLN, 100 mL OMNIPAQUE IOHEXOL 300 MG/ML SOLN  COMPARISON:  CT abdomen and pelvis 01/21/2011.  FINDINGS: Dependent atelectasis is seen lung bases. No pleural or pericardial effusion. Mild thickening of the walls of the distal esophagus is unchanged.  The gallbladder, liver, spleen, adrenal glands, pancreas, kidneys and biliary tree are unremarkable. The stomach, small and large bowel and appendix appear normal. No lymphadenopathy or fluid is identified. A few scattered areas of calcific aortic atherosclerosis are noted. No lytic or sclerotic bony lesion is seen.  IMPRESSION: No acute finding abdomen or pelvis. No abnormality to explain the patient's symptoms.  Mild aortoiliac atherosclerosis without aneurysm.  Mild thickening of the walls of the distal esophagus is unchanged since 2012.   Electronically Signed   By: Inge Rise M.D.   On: 02/17/2015 10:14     EKG Interpretation None      MDM   Final diagnoses:  Periumbilical abdominal pain    Patient presents with abdominal pain. Onset within the last 6 hours. Nontoxic on exam. Tenderness to palpation over the periumbilical region. No rebound or  guarding. No signs of peritonitis. Basic labwork obtained and largely reassuring. Considerations include early appendicitis, peptic ulcer disease, obstruction. CT negative for acute appendicitis or obstruction. Patient given GI cocktail and Protonix. History of achalasia and patient noted to have thickening of the distal esophagus on CT scan. Discussed with patient supportive care at home.  After history, exam, and medical workup I feel the patient has been appropriately medically screened and is safe for discharge home. Pertinent diagnoses were discussed with the patient. Patient was given return precautions.     Merryl Hacker, MD 02/17/15 1031  Given patient's antiretrovirals, patient given Zantac at discharge instead of PPI.  Merryl Hacker, MD 02/17/15 1056

## 2015-03-05 ENCOUNTER — Ambulatory Visit: Payer: Medicaid Other | Admitting: Infectious Diseases

## 2015-03-17 ENCOUNTER — Other Ambulatory Visit: Payer: Medicaid Other

## 2015-03-17 ENCOUNTER — Ambulatory Visit: Payer: Medicaid Other | Attending: Family Medicine | Admitting: Family Medicine

## 2015-03-17 ENCOUNTER — Encounter: Payer: Self-pay | Admitting: Family Medicine

## 2015-03-17 VITALS — BP 127/72 | HR 70 | Temp 97.5°F | Ht 73.0 in | Wt 247.0 lb

## 2015-03-17 DIAGNOSIS — M5126 Other intervertebral disc displacement, lumbar region: Secondary | ICD-10-CM | POA: Diagnosis not present

## 2015-03-17 DIAGNOSIS — K219 Gastro-esophageal reflux disease without esophagitis: Secondary | ICD-10-CM | POA: Diagnosis not present

## 2015-03-17 DIAGNOSIS — F1290 Cannabis use, unspecified, uncomplicated: Secondary | ICD-10-CM

## 2015-03-17 DIAGNOSIS — M545 Low back pain: Secondary | ICD-10-CM

## 2015-03-17 DIAGNOSIS — M791 Myalgia, unspecified site: Secondary | ICD-10-CM

## 2015-03-17 DIAGNOSIS — G4730 Sleep apnea, unspecified: Secondary | ICD-10-CM

## 2015-03-17 DIAGNOSIS — K220 Achalasia of cardia: Secondary | ICD-10-CM

## 2015-03-17 DIAGNOSIS — Z79899 Other long term (current) drug therapy: Secondary | ICD-10-CM

## 2015-03-17 DIAGNOSIS — G8929 Other chronic pain: Secondary | ICD-10-CM

## 2015-03-17 DIAGNOSIS — F1210 Cannabis abuse, uncomplicated: Secondary | ICD-10-CM

## 2015-03-17 DIAGNOSIS — B20 Human immunodeficiency virus [HIV] disease: Secondary | ICD-10-CM

## 2015-03-17 LAB — LIPID PANEL
Cholesterol: 191 mg/dL (ref 0–200)
HDL: 29 mg/dL — ABNORMAL LOW (ref >=40)
LDL Cholesterol: 138 mg/dL — ABNORMAL HIGH (ref 0–99)
TRIGLYCERIDES: 121 mg/dL (ref <150)
Total CHOL/HDL Ratio: 6.6 Ratio
VLDL: 24 mg/dL (ref 0–40)

## 2015-03-17 LAB — COMPREHENSIVE METABOLIC PANEL
ALK PHOS: 94 U/L (ref 39–117)
ALT: 16 U/L (ref 0–53)
AST: 19 U/L (ref 0–37)
Albumin: 4.4 g/dL (ref 3.5–5.2)
BILIRUBIN TOTAL: 0.4 mg/dL (ref 0.2–1.2)
BUN: 13 mg/dL (ref 6–23)
CO2: 21 mEq/L (ref 19–32)
Calcium: 9.2 mg/dL (ref 8.4–10.5)
Chloride: 106 mEq/L (ref 96–112)
Creat: 1.34 mg/dL (ref 0.50–1.35)
Glucose, Bld: 87 mg/dL (ref 70–99)
Potassium: 4.3 mEq/L (ref 3.5–5.3)
Sodium: 140 mEq/L (ref 135–145)
Total Protein: 7.3 g/dL (ref 6.0–8.3)

## 2015-03-17 MED ORDER — TRAMADOL HCL 50 MG PO TABS: 50.0000 mg | ORAL_TABLET | Freq: Three times a day (TID) | ORAL | Status: DC | PRN

## 2015-03-17 MED ORDER — CYCLOBENZAPRINE HCL 10 MG PO TABS: 10.0000 mg | ORAL_TABLET | Freq: Every evening | ORAL | Status: DC | PRN

## 2015-03-17 NOTE — Patient Instructions (Addendum)
Benjamin Herrera,  Thank you for coming.   1. Chronic low back pain:  Tramadol Pt and chiropratic referral   2. R rib pain: Flexeril   3. GERD: GI referral   4. Mild sleep apnea: new sleep study ordered for CPAP titration  F/u in 2 months for back pain   Dr. Adrian Blackwater

## 2015-03-17 NOTE — Assessment & Plan Note (Signed)
GERD: GI referral

## 2015-03-17 NOTE — Assessment & Plan Note (Signed)
Chronic low back pain: stable P: Tramadol Pt and chiropratic referral

## 2015-03-17 NOTE — Assessment & Plan Note (Signed)
Mild sleep apnea: new sleep study ordered for CPAP titration

## 2015-03-17 NOTE — Assessment & Plan Note (Signed)
A: R flank pain with 0 well score. MSK pain P: Flexeril prn  Tramadol prn

## 2015-03-17 NOTE — Progress Notes (Signed)
Patient here to follow up on his lower back pain. He states he has had an MRI that showed arthritis.  He states his pain is 3/10 today. He reports he is also being awoken at night because of heartburn

## 2015-03-17 NOTE — Progress Notes (Signed)
   Subjective:    Patient ID: Benjamin Herrera, male    DOB: 09/22/1966, 49 y.o.   MRN: [id number] CC: f/u chronic low back pain, heartburn, new onset R side pain  HPI  1. Low back pain: midline. Chronic. 3/10 today. Tylenol #3 did not help. Patient reports oxycodone helps the most. He cannot establish with pain management because he refuses to quit marijuana. He saw neurosurgery, no surgical intervention deemed necessary. Pain interferes with sleep and walking.  2. Heartburn: wakes him up at night. Gagging. Taking diltiazem for achalasia and zantac for GERD with still significant symptoms. Request referral back to GI, Dr. Deatra Ina.   3. Mild sleep apnea: had sleep study in 12/2014. Mild sleep apnea. Unable to get CPAP titration. Wakes up mostly with GERD symptoms.  4. R side pain: x 2 weeks or so. Soreness. No trauma, SOB, palpitations, fever or dysuria.   Soc Hx: current smoker, THC use  Review of Systems  Constitutional: Negative for fever and chills.  Respiratory: Negative for shortness of breath.   Cardiovascular: Negative for palpitations and leg swelling.  Musculoskeletal: Positive for myalgias and back pain.       Objective:   Physical Exam BP 127/72 mmHg  Pulse 70  Temp(Src) 97.5 F (36.4 C)  Ht 6\' 1"  (1.854 m)  Wt 247 lb (112.038 kg)  BMI 32.59 kg/m2  SpO2 97% General appearance: alert, cooperative and no distress Back: symmetric, no curvature. ROM normal. No CVA tenderness. R latera/inferiorl chest wall pain w/o rash or mass. Lumbar MSK pain along L4.        Assessment & Plan:

## 2015-03-18 LAB — CBC
HCT: 45.5 % (ref 39.0–52.0)
Hemoglobin: 15 g/dL (ref 13.0–17.0)
MCH: 25.4 pg — ABNORMAL LOW (ref 26.0–34.0)
MCHC: 33 g/dL (ref 30.0–36.0)
MCV: 77 fL — AB (ref 78.0–100.0)
MPV: 9.4 fL (ref 8.6–12.4)
PLATELETS: 194 10*3/uL (ref 150–400)
RBC: 5.91 MIL/uL — AB (ref 4.22–5.81)
RDW: 16.6 % — ABNORMAL HIGH (ref 11.5–15.5)
WBC: 5.9 10*3/uL (ref 4.0–10.5)

## 2015-03-18 LAB — HIV-1 RNA QUANT-NO REFLEX-BLD
HIV 1 RNA QUANT: 24 {copies}/mL — AB (ref <20)
HIV-1 RNA Quant, Log: 1.38 {Log} — ABNORMAL HIGH (ref <1.30)

## 2015-03-18 LAB — T-HELPER CELL (CD4) - (RCID CLINIC ONLY)
CD4 % Helper T Cell: 14 % — ABNORMAL LOW (ref 33–55)
CD4 T Cell Abs: 360 /uL — ABNORMAL LOW (ref 400–2700)

## 2015-03-25 ENCOUNTER — Encounter: Payer: Self-pay | Admitting: Physician Assistant

## 2015-03-25 ENCOUNTER — Ambulatory Visit (INDEPENDENT_AMBULATORY_CARE_PROVIDER_SITE_OTHER): Payer: Medicaid Other | Admitting: Physician Assistant

## 2015-03-25 ENCOUNTER — Other Ambulatory Visit: Payer: Medicaid Other

## 2015-03-25 VITALS — BP 110/68 | HR 80 | Ht 73.0 in | Wt 246.6 lb

## 2015-03-25 DIAGNOSIS — R1033 Periumbilical pain: Secondary | ICD-10-CM | POA: Diagnosis not present

## 2015-03-25 DIAGNOSIS — K220 Achalasia of cardia: Secondary | ICD-10-CM | POA: Diagnosis not present

## 2015-03-25 DIAGNOSIS — R1011 Right upper quadrant pain: Secondary | ICD-10-CM

## 2015-03-25 DIAGNOSIS — R1010 Upper abdominal pain, unspecified: Secondary | ICD-10-CM | POA: Diagnosis not present

## 2015-03-25 DIAGNOSIS — G8929 Other chronic pain: Secondary | ICD-10-CM

## 2015-03-25 MED ORDER — DILTIAZEM HCL 60 MG PO TABS: 60.0000 mg | ORAL_TABLET | Freq: Three times a day (TID) | ORAL | Status: DC

## 2015-03-25 NOTE — Patient Instructions (Addendum)
You have been scheduled for an endoscopy. Please follow written instructions given to you at your visit today. If you use inhalers (even only as needed), please bring them with you on the day of your procedure.  We sent refills for the Cardizem. [facility] [address]   Change Cardizem to 60 mg 3 times daily. Stop the bedtime dose.  You have been scheduled for a HIDA scan at [facility] (1st floor) on 04-09-2015. Please arrive  At 6:45 am prior to your scheduled appointment at  8:65 am . Make certain not to have anything to eat or drink at least 6 hours prior to your test. Should this appointment date or time not work well for you, please call radiology scheduling at [phone].  _____________________________________________________________________ hepatobiliary (HIDA) scan is an imaging procedure used to diagnose problems in the liver, gallbladder and bile ducts. In the HIDA scan, a radioactive chemical or tracer is injected into a vein in your arm. The tracer is handled by the liver like bile. Bile is a fluid produced and excreted by your liver that helps your digestive system break down fats in the foods you eat. Bile is stored in your gallbladder and the gallbladder releases the bile when you eat a meal. A special nuclear medicine scanner (gamma camera) tracks the flow of the tracer from your liver into your gallbladder and small intestine.  During your HIDA scan  You'll be asked to change into a hospital gown before your HIDA scan begins. Your health care team will position you on a table, usually on your back. The radioactive tracer is then injected into a vein in your arm.The tracer travels through your bloodstream to your liver, where it's taken up by the bile-producing cells. The radioactive tracer travels with the bile from your liver into your gallbladder and through your bile ducts to your small intestine.You may feel some pressure while the radioactive tracer is injected into your vein.  As you lie on the table, a special gamma camera is positioned over your abdomen taking pictures of the tracer as it moves through your body. The gamma camera takes pictures continually for about an hour. You'll need to keep still during the HIDA scan. This can become uncomfortable, but you may find that you can lessen the discomfort by taking deep breaths and thinking about other things. Tell your health care team if you're uncomfortable. The radiologist will watch on a computer the progress of the radioactive tracer through your body. The HIDA scan may be stopped when the radioactive tracer is seen in the gallbladder and enters your small intestine. This typically takes about an hour. In some cases extra imaging will be performed if original images aren't satisfactory, if morphine is given to help visualize the gallbladder or if the medication CCK is given to look at the contraction of the gallbladder. This test typically takes 2 hours to complete. ________________________________________________________________________

## 2015-03-25 NOTE — Progress Notes (Signed)
Patient ID: Benjamin Herrera, male   DOB: 1966/09/18, 49 y.o.   MRN: [id number]   Subjective:    Patient ID: Benjamin Herrera, male    DOB: [birth year], 49 y.o.   MRN: [id number]  HPI Benjamin Herrera is a 49 year old African-American male known to Dr. Deatra Ina. He has not been seen here since 2014. He does have a diagnosis of achalasia and has undergone previous Botox injections with success. His last procedure was done in December 2014 with Botox injection. He was subsequently referred to [facility] for discussion regarding balloon dilation but was told that his disease was not severe enough to warrant that or surgery at present. In the interim he has been using Cardizem 60 mg 4 times daily which she says is helpful. He has been having problems recently with frequent nighttime waking with gagging and choking. He says he gets up very quickly trying to get to the bathroom in case he vomits and has had a couple of syncopal episodes over the past month. He says when he wakes up he just has been gagging choking sensation does not feel short of breath have chest pain headache etc. says he gets up once to the bathroom and has woken up on the bathroom floor for couple of times over the past month. If he does not use the Cardizem he says he will have difficulty swallowing liquids and swallow solids. Exline He also would like evaluation for a recurrent sharp mid abdominal pain which she says he gets about every 5-6 months and usually has to go to the emergency room for. He says his pain is located in his mid abdomen very intense associated with nausea or vomiting and resolves after he gets morphine in the emergency room. He did have a CT scan of the abdomen and pelvis within the past 2 months which was unremarkable. Prior colonoscopy had been done and normal in 2013.  Review of Systems Pertinent positive and negative review of systems were noted in the above HPI section.  All other review of systems was otherwise  negative.  Outpatient Encounter Prescriptions as of 03/25/2015  Medication Sig  . cyclobenzaprine (FLEXERIL) 10 MG tablet Take 1 tablet (10 mg total) by mouth at bedtime as needed for muscle spasms.  . diclofenac sodium (VOLTAREN) 1 % GEL Apply 2 g topically 2 (two) times daily.  Marland Kitchen diltiazem (CARDIZEM) 60 MG tablet Take 1 tablet (60 mg total) by mouth 3 (three) times daily.  . Emtricitab-Rilpivir-Tenofovir (COMPLERA) 200-25-300 MG TABS Take 1 tablet by mouth daily.  . Naphazoline-Pheniramine (OPCON-A OP) Apply 1-2 drops to eye 3 (three) times daily as needed (dry eyes.).  Marland Kitchen ranitidine (ZANTAC) 150 MG capsule Take 2 capsules (300 mg total) by mouth daily.  Marland Kitchen TIVICAY 50 MG tablet TAKE 1 TABLET (50 MG TOTAL) BY MOUTH DAILY.  . traMADol (ULTRAM) 50 MG tablet Take 1 tablet (50 mg total) by mouth every 8 (eight) hours as needed.  . [DISCONTINUED] diltiazem (CARDIZEM) 60 MG tablet TAKE 1 TABLET (60 MG TOTAL) BY MOUTH 4 (FOUR) TIMES DAILY.   No facility-administered encounter medications on file as of 03/25/2015.   No Known Allergies Patient Active Problem List   Diagnosis Date Noted  . GERD (gastroesophageal reflux disease) 01/10/2015  . Chronic low back pain 10/07/2014  . Syncope and collapse 10/07/2014  . Mild sleep apnea 10/07/2014  . 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  . Numbness and tingling in left hand 02/12/2013  . Myalgia 02/12/2013  . Internal  hemorrhoids with bleeding 01/30/2013  . 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 Condyloma acuminatum in male of scrotum & anal canal s/p laser ablation 07/03/2012  . HTN (hypertension) 08/31/2010  . PSORIASIS 11/20/2009  . Achalasia and cardiospasm 08/14/2009  . OTHER CANDIDIASIS OF OTHER SPECIFIED SITES 06/26/2009  . Marijuana use 05/16/2008  . ASTHMA, UNSPECIFIED, UNSPECIFIED STATUS 05/16/2008  . HIV disease 09/03/2006  . FRACTURE, HUMERUS 09/03/2006   History    Social History  . Marital Status: Single    Spouse Name: N/A  . Number of Children: N/A  . Years of Education: N/A   Occupational History  . Not on file.   Social History Main Topics  . Smoking status: Current Every Day Smoker -- 0.50 packs/day for 25 years    Types: Cigarettes  . Smokeless tobacco: Never Used     Comment: not ready  . Alcohol Use: No  . Drug Use: 7.00 per week    Special: Marijuana     Comment: pt now denies cocaine use , last cocaine use 4 yrs ago, occasional marijuana use  . Sexual Activity:    Partners: Male     Comment: pt. given condoms   Other Topics Concern  . Not on file   Social History Narrative    Mr. Kovalenko family history includes Arthritis in his mother; Diabetes in his maternal grandmother; Heart disease in his mother; Hypertension in his mother; Stomach cancer in his maternal grandmother. There is no history of Colon cancer.      Objective:    Filed Vitals:   03/25/15 0957  BP: 110/68  Pulse: 80    Physical Exam  well-developed African American male in no acute distress, blood pressure 110/68 pulse 80 height 6 foot 1 weight 246. HEENT ;nontraumatic normocephalic EOMI PERRLA sclera anicteric, Cardiovascular ;regular rate and rhythm with S1-S2 no murmur or gallop, Pulmonary; clear bilaterally, Abdomen ;soft nontender nondistended bowel sounds are active no palpable mass or hepatosplenomegaly, Rectal ;exam not done, Ext; no clubbing cyanosis or edema skin warm and dry, Psych ;mood and affect appropriate       Assessment & Plan:   #1 49 yo AA male with HIV with known Achalasia. Last EGD with Botox 09/2013. Generally has symptom control on cardizem .  Now with recurrent nocturnal episodes of gaging and choking  And several syncopal episodes - I suspect syncope secondary to orthostatic hypotension- perhaps being aggravated by nightime dose of cardizem 60 mg #2 HTN  #3 episodic mid abdominal pain of unclear etiology- negative CT- r/o  biliary dyskinesia #4 GERD  Plan; stop bedtime dose of cardizem , continue Cardizem TID 60 mg  For now  Schedule for EGD with Botox with Dr. Deatra Ina at [place] 45 degrees Schedule for CCK HIDA   Amy S Esterwood PA-C 03/25/2015   Cc: Boykin Nearing, MD

## 2015-03-27 NOTE — Progress Notes (Signed)
Reviewed and agree with management. Robert D. Kaplan, M.D., FACG  

## 2015-04-02 ENCOUNTER — Ambulatory Visit: Payer: Medicaid Other | Attending: Family Medicine | Admitting: Physical Therapy

## 2015-04-02 DIAGNOSIS — M2560 Stiffness of unspecified joint, not elsewhere classified: Secondary | ICD-10-CM | POA: Diagnosis not present

## 2015-04-02 DIAGNOSIS — M545 Low back pain: Secondary | ICD-10-CM | POA: Insufficient documentation

## 2015-04-02 DIAGNOSIS — M5386 Other specified dorsopathies, lumbar region: Secondary | ICD-10-CM

## 2015-04-02 DIAGNOSIS — M62830 Muscle spasm of back: Secondary | ICD-10-CM | POA: Diagnosis not present

## 2015-04-02 NOTE — Patient Instructions (Signed)
   Aariz Maish PT, DPT, LAT, ATC  [facility] Phone: [phone]     

## 2015-04-02 NOTE — Therapy (Signed)
[place], [place], [address] Phone: [phone]   Fax:  [phone]  Physical Therapy Evaluation  Patient Details  Name: Benjamin Herrera MRN: [id number] Date of Birth: 1966/08/29 Referring Provider:  Boykin Nearing, MD  Encounter Date: 04/02/2015      PT End of Session - 04/02/15 1407    Visit Number 1   Number of Visits 1   PT Start Time 1330   PT Stop Time 1415   PT Time Calculation (min) 45 min   Activity Tolerance Patient tolerated treatment well   Behavior During Therapy [facility] for tasks assessed/performed      Past Medical History  Diagnosis Date  . Polysubstance abuse   . Boils     under arms hx of  . Hemorrhoids   . Herpes labialis   . Psoriasis     hx of  . Achalasia and cardiospasm   . Candidiasis of unspecified site   . Dysphagia   . Asthma   . Candidiasis of mouth   . Fracture, humerus   . Hepatitis 1993    A  . 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 Pilonidal disease 01/10/2012  . Condyloma acuminatum in male of scrotum & anal canal s/p laser ablation 07/03/2012  . 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 HIV (human immunodeficiency virus infection) DX 1993  . Hypertension Dx 2014    off all bp meds for last 9 or 10 months    Past Surgical History  Procedure Laterality Date  . Humeral fracture surgery Right yrs ago  . Right shoulder replacement  2008    x 2  . Esophagogastroduodenoscopy  11/11/2011    Procedure: ESOPHAGOGASTRODUODENOSCOPY (EGD);  Surgeon: Inda Castle, MD;  Location: [facility];  Service: Endoscopy;  Laterality: N/A;  botox injection  called Pt to change time of procedure per Dr Deatra Ina  . Esophagogastroduodenoscopy  03/10/2012    Procedure: ESOPHAGOGASTRODUODENOSCOPY (EGD);  Surgeon: Inda Castle, MD;  Location: [facility];  Service: Endoscopy;  Laterality: N/A;  .  Colonoscopy  05/19/2012    Procedure: COLONOSCOPY;  Surgeon: Inda Castle, MD;  [facility]: [facility];  Service: Endoscopy;  Laterality: N/A;  jill trying to contact pt to come in 0830 for 930 case, phone not accepting messages  . Esophageal manometry  05/29/2012    Procedure: ESOPHAGEAL MANOMETRY (EM);  Surgeon: Inda Castle, MD;  [facility]: [facility];  Service: Endoscopy;  Laterality: N/A;  . Pilonidal cyst excision  07/24/2012    Procedure: CYST EXCISION PILONIDAL SIMPLE;  Surgeon: Adin Hector, MD;  [facility]: [facility];  Service: General;  Laterality: N/A;  Exam Under Anesthesia,, Excision Pilonidal Disease,   . Examination under anesthesia  07/24/2012    Procedure: EXAM UNDER ANESTHESIA;  Surgeon: Adin Hector, MD;  [facility];  Service: General;  Laterality: N/A;  . Wart fulguration  07/24/2012    Procedure: FULGURATION ANAL WART;  Surgeon: Adin Hector, MD;  [facility]: [facility];  Service: General;  Laterality: N/A;  excision of raphe mass  . Esophagogastroduodenoscopy  10/11/2012    Procedure: ESOPHAGOGASTRODUODENOSCOPY (EGD);  Surgeon: Inda Castle, MD;  Location: [facility];  Service: Endoscopy;  Laterality: N/A;  . Botox injection  10/11/2012    Procedure: BOTOX INJECTION;  Surgeon: Inda Castle, MD;  [facility];  Service: Endoscopy;  Laterality: N/A;  .  Esophagogastroduodenoscopy N/A 01/25/2013    Procedure: ESOPHAGOGASTRODUODENOSCOPY (EGD);  Surgeon: Inda Castle, MD;  Location: [facility];  Service: Endoscopy;  Laterality: N/A;  . Botox injection N/A 01/25/2013    Procedure: BOTOX INJECTION;  Surgeon: Inda Castle, MD;  [facility];  Service: Endoscopy;  Laterality: N/A;  . Esophagogastroduodenoscopy N/A 06/21/2013    Procedure: ESOPHAGOGASTRODUODENOSCOPY (EGD);  Surgeon: Inda Castle, MD;  Location: [facility];  Service: Endoscopy;  Laterality: N/A;  . Botox injection N/A 06/21/2013    Procedure: BOTOX INJECTION;  Surgeon: Inda Castle, MD;  [facility];  Service: Endoscopy;  Laterality: N/A;  . Esophagogastroduodenoscopy (egd) with propofol N/A 10/08/2013    Procedure: ESOPHAGOGASTRODUODENOSCOPY (EGD) WITH PROPOFOL;  Surgeon: Inda Castle, MD;  [facility];  Service: Endoscopy;  Laterality: N/A;  . Botox injection N/A 10/08/2013    Procedure: BOTOX INJECTION;  Surgeon: Inda Castle, MD;  [facility];  Service: Endoscopy;  Laterality: N/A;    There were no vitals filed for this visit.  Visit Diagnosis:  Bilateral low back pain, with sciatica presence unspecified  Decreased ROM of lumbar spine  Muscle spasm of back      Subjective Assessment - 04/02/15 1333    Subjective pt is a 49 y.o m with CC of low back pain that started 6-8  months ago which he reports is arthritis.  He reports that the sypmtoms are about the same since onset.    Limitations Walking;Standing;Lifting;House hold activities;Sitting   How long can you sit comfortably? unlimited   How long can you stand comfortably? 10 min   How long can you walk comfortably? 5 min   Diagnostic tests MRI 10/24/2014, disc bulging   Patient Stated Goals to get some exercises to help relieve the pain and walk/stand longer   Currently in Pain? Yes   Pain Score 3    Pain Location Back   Pain Orientation Lower;Left;Right;Mid   Pain Descriptors / Indicators Sharp;Stabbing   Pain Type Chronic pain   Pain Radiating Towards to R hip   Pain Onset More than a month ago   Pain Frequency Constant   Aggravating Factors  stand, walking, stairs   Pain Relieving Factors sit down, massager   Effect of Pain on Daily Activities limited strength and endurance            [facility] PT Assessment - 04/02/15 1337    Assessment   Medical Diagnosis low back pain   Onset Date/Surgical Date --  6 months ago   Hand Dominance Right   Next MD Visit PRN   Prior Therapy yes   for shoulder   Precautions   Precautions None   Restrictions    Weight Bearing Restrictions No   Balance Screen   Has the patient fallen in the past 6 months No   Has the patient had a decrease in activity level because of a fear of falling?  No   Is the patient reluctant to leave their home because of a fear of falling?  No   Home Environment   Living Environment Private residence   Living Arrangements Alone   Type of [place] Access Level entry   Home Layout One level   Prior Function   Level of Independence Independent;Independent with basic ADLs;Independent with gait;Independent with transfers;Independent with community mobility without device;Independent with household mobility without device   Vocation On disability   Leisure watch tv, play golf, cookout  Cognition   Overall Cognitive Status Within Functional Limits for tasks assessed   Posture/Postural Control   Posture/Postural Control Postural limitations   Postural Limitations Rounded Shoulders;Forward head   ROM / Strength   AROM / PROM / Strength AROM;Strength   AROM   AROM Assessment Site Lumbar   Lumbar Flexion 70  pain at end range   Lumbar Extension 20  with pain noted during movement   Lumbar - Right Side Bend 20  pain during movement   Lumbar - Left Side Bend 22  pain during movement   Lumbar - Right Rotation 75%   Lumbar - Left Rotation 75%   Strength   Strength Assessment Site Hip;Knee   Right/Left Hip Left;Right   Right Hip Flexion 5/5   Right Hip Extension 5/5   Right Hip ABduction 5/5   Right Hip ADduction 5/5   Left Hip Flexion 5/5   Left Hip Extension 5/5   Left Hip ABduction 5/5   Left Hip ADduction 5/5   Right/Left Knee Right;Left   Right Knee Flexion 5/5   Right Knee Extension 5/5   Left Knee Flexion 5/5   Left Knee Extension 5/5   Palpation   Spinal mobility hypomobility of L1-L5 with pain during palapation    assess in sitting (can't tolerated laying prone)   Palpation comment bil lumbar paraspinal spasm noted with increased pain to  the PSIS   Special Tests    Special Tests Sacrolliac Tests   Slump test   Findings Negative   Prone Knee Bend Test   Findings Unable to test   Comment --  reports cant get on his stomach   Straight Leg Raise   Findings Negative                           PT Education - 04/02/15 1407    Education provided Yes   Education Details evaluation findings, HEP   Person(s) Educated Patient   Methods Explanation   Comprehension Verbalized understanding                    Plan - 04/02/15 1407    Clinical Impression Statement Zenia Resides present to OPPT with CC of low back pain that had been going on for a couple months. He demonstrates limited trunk mobility secondary to pain noted during and at end range of motion. MMT was unremarkable in bil. palpation revealed hypomobility of  L1-L5 P/A with pain upon palpaiton and with spasm of bil paraspasm with L>R. pt was educated and provided a HEP  for low back with education on how to progress exercises.    Pt will benefit from skilled therapeutic intervention in order to improve on the following deficits Pain;Decreased activity tolerance;Decreased mobility;Decreased endurance;Decreased range of motion;Difficulty walking;Increased muscle spasms   PT Frequency --  1 visit only approved   PT Next Visit Plan Medicaid only 1 visit approved   PT Home Exercise Plan see HEP handout   Consulted and Agree with Plan of Care Patient         Problem List Patient Active Problem List   Diagnosis Date Noted  . GERD (gastroesophageal reflux disease) 01/10/2015  . Chronic low back pain 10/07/2014  . Syncope and collapse 10/07/2014  . Mild sleep apnea 10/07/2014  . 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  . Numbness and tingling in left hand 02/12/2013  . Myalgia 02/12/2013  . Internal hemorrhoids with bleeding 01/30/2013  .  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 Condyloma acuminatum in male of scrotum & anal canal s/p laser ablation 07/03/2012  . HTN (hypertension) 08/31/2010  . PSORIASIS 11/20/2009  . Achalasia and cardiospasm 08/14/2009  . OTHER CANDIDIASIS OF OTHER SPECIFIED SITES 06/26/2009  . Marijuana use 05/16/2008  . ASTHMA, UNSPECIFIED, UNSPECIFIED STATUS 05/16/2008  . HIV disease 09/03/2006  . FRACTURE, HUMERUS 09/03/2006   Starr Lake PT, DPT, LAT, ATC  04/02/2015  2:14 PM      [facility] [address] [place], [place], [address] Phone: [phone]   Fax:  [phone]

## 2015-04-07 ENCOUNTER — Encounter: Payer: Self-pay | Admitting: Infectious Diseases

## 2015-04-07 ENCOUNTER — Ambulatory Visit (INDEPENDENT_AMBULATORY_CARE_PROVIDER_SITE_OTHER): Payer: Medicaid Other | Admitting: Infectious Diseases

## 2015-04-07 VITALS — BP 146/83 | HR 80 | Temp 98.0°F | Ht 73.5 in | Wt 246.0 lb

## 2015-04-07 DIAGNOSIS — M545 Low back pain: Secondary | ICD-10-CM

## 2015-04-07 DIAGNOSIS — B20 Human immunodeficiency virus [HIV] disease: Secondary | ICD-10-CM | POA: Diagnosis present

## 2015-04-07 DIAGNOSIS — I10 Essential (primary) hypertension: Secondary | ICD-10-CM | POA: Diagnosis not present

## 2015-04-07 DIAGNOSIS — A630 Anogenital (venereal) warts: Secondary | ICD-10-CM

## 2015-04-07 DIAGNOSIS — Z23 Encounter for immunization: Secondary | ICD-10-CM | POA: Diagnosis not present

## 2015-04-07 DIAGNOSIS — M791 Myalgia, unspecified site: Secondary | ICD-10-CM

## 2015-04-07 DIAGNOSIS — G8929 Other chronic pain: Secondary | ICD-10-CM

## 2015-04-07 DIAGNOSIS — Z79899 Other long term (current) drug therapy: Secondary | ICD-10-CM

## 2015-04-07 DIAGNOSIS — Z113 Encounter for screening for infections with a predominantly sexual mode of transmission: Secondary | ICD-10-CM

## 2015-04-07 MED ORDER — DICLOFENAC SODIUM 1 % TD GEL: 2.0000 g | Freq: Two times a day (BID) | TRANSDERMAL | Status: DC

## 2015-04-07 NOTE — Assessment & Plan Note (Signed)
He's doing well. Will get hep B #3 (actually #6) today.  Given condoms Will see him back in 6 months with labs prior.

## 2015-04-07 NOTE — Assessment & Plan Note (Signed)
Will cotn to f/u with pcp whom we greatly appreciate.

## 2015-04-07 NOTE — Assessment & Plan Note (Signed)
Will cont to take his current rx, will f/u with his PCP and his chiropracter.

## 2015-04-07 NOTE — Assessment & Plan Note (Signed)
States that these have not returned. Will continue to monitor.

## 2015-04-07 NOTE — Addendum Note (Signed)
Addended by: Landis Gandy on: 04/07/2015 05:16 PM   Modules accepted: Orders

## 2015-04-07 NOTE — Progress Notes (Signed)
   Subjective:    Patient ID: Benjamin Herrera, male    DOB: 1966/10/03, 49 y.o.   MRN: [id number]  HPI 49 yo M with HIV+, anal condyloma, achalasia, HTN. R shoulder pain (previous rod in his shoulder).  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 Was taking TRV/ISN, had DRVr added at his visit 06-09-12. Then changed to tivicay and complera.   No problems with ART.  No further anal warts.  Has been having back pain, seeing chiropracter.   HIV 1 RNA QUANT (copies/mL)  Date Value  03/17/2015 24*  09/04/2014 <20  04/22/2014 <20   CD4 T CELL ABS (/uL)  Date Value  03/17/2015 360*  09/04/2014 410  04/22/2014 320*   Review of Systems  Constitutional: Negative for appetite change and unexpected weight change.  Gastrointestinal: Negative for diarrhea and constipation.  Genitourinary: Negative for difficulty urinating.       Objective:   Physical Exam  Constitutional: He appears well-developed and well-nourished.  HENT:  Mouth/Throat: No oropharyngeal exudate.  Eyes: EOM are normal. Pupils are equal, round, and reactive to light.  Neck: Neck supple.  Cardiovascular: Normal rate, regular rhythm and normal heart sounds.   Pulmonary/Chest: Effort normal and breath sounds normal.  Abdominal: Soft. Bowel sounds are normal. There is no tenderness.  Lymphadenopathy:    He has no cervical adenopathy.       Assessment & Plan:

## 2015-04-09 ENCOUNTER — Ambulatory Visit (HOSPITAL_COMMUNITY)
Admission: RE | Admit: 2015-04-09 | Discharge: 2015-04-09 | Disposition: A | Discharge status: Home / Self Care | Payer: Medicaid Other | Source: Ambulatory Visit | Attending: Physician Assistant | Admitting: Physician Assistant

## 2015-04-09 DIAGNOSIS — R112 Nausea with vomiting, unspecified: Secondary | ICD-10-CM | POA: Diagnosis not present

## 2015-04-09 DIAGNOSIS — R1011 Right upper quadrant pain: Secondary | ICD-10-CM | POA: Insufficient documentation

## 2015-04-09 DIAGNOSIS — G8929 Other chronic pain: Secondary | ICD-10-CM

## 2015-04-09 DIAGNOSIS — K220 Achalasia of cardia: Secondary | ICD-10-CM | POA: Diagnosis not present

## 2015-04-09 MED ORDER — SINCALIDE 5 MCG IJ SOLR
0.0200 ug/kg | Freq: Once | INTRAMUSCULAR | Status: AC
  Administered 2015-04-09: 2 ug via INTRAVENOUS

## 2015-04-09 MED ORDER — TECHNETIUM TC 99M MEBROFENIN IV KIT
5.1000 | PACK | Freq: Once | INTRAVENOUS | Status: AC | PRN
  Administered 2015-04-09: 5.1 via INTRAVENOUS

## 2015-04-15 ENCOUNTER — Encounter (HOSPITAL_COMMUNITY): Payer: Self-pay | Admitting: *Deleted

## 2015-04-22 ENCOUNTER — Telehealth: Payer: Self-pay | Admitting: Gastroenterology

## 2015-04-22 ENCOUNTER — Ambulatory Visit (HOSPITAL_COMMUNITY): Admission: RE | Admit: 2015-04-22 | Payer: Medicaid Other | Source: Ambulatory Visit | Admitting: Gastroenterology

## 2015-04-22 SURGERY — ESOPHAGOGASTRODUODENOSCOPY (EGD) WITH PROPOFOL
Anesthesia: Monitor Anesthesia Care

## 2015-04-22 NOTE — Telephone Encounter (Signed)
Procedure cancelled. No answer no voicemail for the patient.

## 2015-04-24 ENCOUNTER — Other Ambulatory Visit: Payer: Self-pay

## 2015-04-24 DIAGNOSIS — K220 Achalasia of cardia: Secondary | ICD-10-CM

## 2015-04-24 NOTE — Telephone Encounter (Signed)
Patient contacted and rescheduled.

## 2015-05-05 ENCOUNTER — Other Ambulatory Visit: Payer: Self-pay | Admitting: Internal Medicine

## 2015-05-05 DIAGNOSIS — B20 Human immunodeficiency virus [HIV] disease: Secondary | ICD-10-CM

## 2015-05-05 MED ORDER — DOLUTEGRAVIR SODIUM 50 MG PO TABS: 50.0000 mg | ORAL_TABLET | Freq: Every day | ORAL | Status: DC

## 2015-05-16 ENCOUNTER — Encounter (HOSPITAL_COMMUNITY): Payer: Self-pay

## 2015-05-16 ENCOUNTER — Emergency Department (HOSPITAL_COMMUNITY)
Admission: EM | Admit: 2015-05-16 | Discharge: 2015-05-16 | Disposition: A | Discharge status: Home / Self Care | Payer: Medicaid Other | Attending: Emergency Medicine | Admitting: Emergency Medicine

## 2015-05-16 DIAGNOSIS — Z720 Tobacco use: Secondary | ICD-10-CM | POA: Diagnosis not present

## 2015-05-16 DIAGNOSIS — Z21 Asymptomatic human immunodeficiency virus [HIV] infection status: Secondary | ICD-10-CM | POA: Insufficient documentation

## 2015-05-16 DIAGNOSIS — Z872 Personal history of diseases of the skin and subcutaneous tissue: Secondary | ICD-10-CM | POA: Insufficient documentation

## 2015-05-16 DIAGNOSIS — I10 Essential (primary) hypertension: Secondary | ICD-10-CM | POA: Insufficient documentation

## 2015-05-16 DIAGNOSIS — Z8619 Personal history of other infectious and parasitic diseases: Secondary | ICD-10-CM | POA: Diagnosis not present

## 2015-05-16 DIAGNOSIS — Z85828 Personal history of other malignant neoplasm of skin: Secondary | ICD-10-CM | POA: Insufficient documentation

## 2015-05-16 DIAGNOSIS — R109 Unspecified abdominal pain: Secondary | ICD-10-CM | POA: Diagnosis present

## 2015-05-16 DIAGNOSIS — Z8781 Personal history of (healed) traumatic fracture: Secondary | ICD-10-CM | POA: Diagnosis not present

## 2015-05-16 DIAGNOSIS — R110 Nausea: Secondary | ICD-10-CM | POA: Insufficient documentation

## 2015-05-16 DIAGNOSIS — R1084 Generalized abdominal pain: Secondary | ICD-10-CM | POA: Insufficient documentation

## 2015-05-16 DIAGNOSIS — Z79899 Other long term (current) drug therapy: Secondary | ICD-10-CM | POA: Diagnosis not present

## 2015-05-16 DIAGNOSIS — Z8719 Personal history of other diseases of the digestive system: Secondary | ICD-10-CM | POA: Diagnosis not present

## 2015-05-16 DIAGNOSIS — J45909 Unspecified asthma, uncomplicated: Secondary | ICD-10-CM | POA: Diagnosis not present

## 2015-05-16 LAB — CBC WITH DIFFERENTIAL/PLATELET
Basophils Absolute: 0 10*3/uL (ref 0.0–0.1)
Basophils Relative: 0 % (ref 0–1)
Eosinophils Absolute: 0.1 10*3/uL (ref 0.0–0.7)
Eosinophils Relative: 2 % (ref 0–5)
HCT: 44.8 % (ref 39.0–52.0)
Hemoglobin: 14.7 g/dL (ref 13.0–17.0)
Lymphocytes Relative: 43 % (ref 12–46)
Lymphs Abs: 2.3 10*3/uL (ref 0.7–4.0)
MCH: 26 pg (ref 26.0–34.0)
MCHC: 32.8 g/dL (ref 30.0–36.0)
MCV: 79.2 fL (ref 78.0–100.0)
Monocytes Absolute: 0.4 10*3/uL (ref 0.1–1.0)
Monocytes Relative: 7 % (ref 3–12)
Neutro Abs: 2.6 10*3/uL (ref 1.7–7.7)
Neutrophils Relative %: 48 % (ref 43–77)
Platelets: 173 10*3/uL (ref 150–400)
RBC: 5.66 MIL/uL (ref 4.22–5.81)
RDW: 15.7 % — ABNORMAL HIGH (ref 11.5–15.5)
WBC: 5.5 10*3/uL (ref 4.0–10.5)

## 2015-05-16 LAB — COMPREHENSIVE METABOLIC PANEL
ALT: 20 U/L (ref 17–63)
AST: 22 U/L (ref 15–41)
Albumin: 3.9 g/dL (ref 3.5–5.0)
Alkaline Phosphatase: 97 U/L (ref 38–126)
Anion gap: 7 (ref 5–15)
BUN: 12 mg/dL (ref 6–20)
CO2: 25 mmol/L (ref 22–32)
Calcium: 9.1 mg/dL (ref 8.9–10.3)
Chloride: 105 mmol/L (ref 101–111)
Creatinine, Ser: 1.33 mg/dL — ABNORMAL HIGH (ref 0.61–1.24)
GFR calc Af Amer: >60 mL/min (ref >60)
GFR calc non Af Amer: >60 mL/min (ref >60)
Glucose, Bld: 113 mg/dL — ABNORMAL HIGH (ref 65–99)
Potassium: 4 mmol/L (ref 3.5–5.1)
Sodium: 137 mmol/L (ref 135–145)
Total Bilirubin: 0.6 mg/dL (ref 0.3–1.2)
Total Protein: 7.8 g/dL (ref 6.5–8.1)

## 2015-05-16 LAB — URINALYSIS, ROUTINE W REFLEX MICROSCOPIC
Bilirubin Urine: NEGATIVE
Glucose, UA: NEGATIVE mg/dL
Ketones, ur: NEGATIVE mg/dL
Leukocytes, UA: NEGATIVE
Nitrite: NEGATIVE
Protein, ur: NEGATIVE mg/dL
Specific Gravity, Urine: 1.019 (ref 1.005–1.030)
Urobilinogen, UA: 0.2 mg/dL (ref 0.0–1.0)
pH: 5.5 (ref 5.0–8.0)

## 2015-05-16 LAB — URINE MICROSCOPIC-ADD ON

## 2015-05-16 MED ORDER — ONDANSETRON 4 MG PO TBDP
4.0000 mg | ORAL_TABLET | Freq: Once | ORAL | Status: AC
  Administered 2015-05-16: 4 mg via ORAL
  Filled 2015-05-16: qty 1

## 2015-05-16 MED ORDER — GI COCKTAIL ~~LOC~~
30.0000 mL | Freq: Once | ORAL | Status: AC
  Administered 2015-05-16: 30 mL via ORAL
  Filled 2015-05-16: qty 30

## 2015-05-16 MED ORDER — SODIUM CHLORIDE 0.9 % IV BOLUS (SEPSIS)
1000.0000 mL | Freq: Once | INTRAVENOUS | Status: AC
  Administered 2015-05-16: 1000 mL via INTRAVENOUS

## 2015-05-16 MED ORDER — HYDROMORPHONE HCL 1 MG/ML IJ SOLN
1.0000 mg | Freq: Once | INTRAMUSCULAR | Status: AC
  Administered 2015-05-16: 1 mg via INTRAVENOUS
  Filled 2015-05-16: qty 1

## 2015-05-16 NOTE — ED Notes (Signed)
Patient aware that a urine sample is needed. Urinal is at the bedside.  

## 2015-05-16 NOTE — ED Notes (Signed)
Patient c/o mid abdominal pain and nausea x 1 hour.

## 2015-05-16 NOTE — ED Notes (Signed)
Pt tolerating po water.

## 2015-05-16 NOTE — ED Provider Notes (Signed)
CSN: [id number]     Arrival date & time 05/16/15  9470 History   First MD Initiated Contact with Patient 05/16/15 1015     Chief Complaint  Patient presents with  . Abdominal Pain  . Nausea     (Consider location/radiation/quality/duration/timing/severity/associated sxs/prior Treatment) HPI Benjamin Herrera is a 49 y.o male with a history of asthma, polysubstance abuse, hepatitis, HIV, hypertension who presents for intermittent abdominal pain that he describes as sharp and began suddenly at 7 AM this morning. He states he has had this multiple times in the past and this feels like one of his episodes.  He states he is scheduled to have an endoscopy next month. No treatment prior to arrival. Nothing makes his pain better or worse. He denies any fever, chills, chest pain, shortness of breath, cough, recent illness, vomiting, diarrhea, hematuria, dysuria. Past Medical History  Diagnosis Date  . Polysubstance abuse   . Boils     under arms hx of  . Hemorrhoids   . Herpes labialis   . Psoriasis     hx of  . Achalasia and cardiospasm   . Candidiasis of unspecified site   . Dysphagia   . Asthma   . Candidiasis of mouth   . Fracture, humerus   . Hepatitis 1993    A  . 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 Pilonidal disease 01/10/2012  . Condyloma acuminatum in male of scrotum & anal canal s/p laser ablation 07/03/2012  . 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 HIV (human immunodeficiency virus infection) DX 1993  . Hypertension Dx 2014    off all bp meds for last 9 or 10 months   Past Surgical History  Procedure Laterality Date  . Humeral fracture surgery Right yrs ago  . Right shoulder replacement  2008    x 2  . Esophagogastroduodenoscopy  11/11/2011    Procedure: ESOPHAGOGASTRODUODENOSCOPY (EGD);  Surgeon: Inda Castle, MD;  Location: [facility];  Service: Endoscopy;  Laterality: N/A;  botox  injection  called Pt to change time of procedure per Dr Deatra Ina  . Esophagogastroduodenoscopy  03/10/2012    Procedure: ESOPHAGOGASTRODUODENOSCOPY (EGD);  Surgeon: Inda Castle, MD;  Location: [facility];  Service: Endoscopy;  Laterality: N/A;  . Colonoscopy  05/19/2012    Procedure: COLONOSCOPY;  Surgeon: Inda Castle, MD;  [facility];  Service: Endoscopy;  Laterality: N/A;  jill trying to contact pt to come in 0830 for 930 case, phone not accepting messages  . Esophageal manometry  05/29/2012    Procedure: ESOPHAGEAL MANOMETRY (EM);  Surgeon: Inda Castle, MD;  [facility];  Service: Endoscopy;  Laterality: N/A;  . Pilonidal cyst excision  07/24/2012    Procedure: CYST EXCISION PILONIDAL SIMPLE;  Surgeon: Adin Hector, MD;  [facility]: [facility];  Service: General;  Laterality: N/A;  Exam Under Anesthesia,, Excision Pilonidal Disease,   . Examination under anesthesia  07/24/2012    Procedure: EXAM UNDER ANESTHESIA;  Surgeon: Adin Hector, MD;  [facility];  Service: General;  Laterality: N/A;  . Wart fulguration  07/24/2012    Procedure: FULGURATION ANAL WART;  Surgeon: Adin Hector, MD;  [facility]: [facility];  Service: General;  Laterality: N/A;  excision of raphe mass  . Esophagogastroduodenoscopy  10/11/2012    Procedure: ESOPHAGOGASTRODUODENOSCOPY (EGD);  Surgeon: Inda Castle, MD;  Location: [facility];  Service: Endoscopy;  Laterality:  N/A;  . Botox injection  10/11/2012    Procedure: BOTOX INJECTION;  Surgeon: Inda Castle, MD;  [facility];  Service: Endoscopy;  Laterality: N/A;  . Esophagogastroduodenoscopy N/A 01/25/2013    Procedure: ESOPHAGOGASTRODUODENOSCOPY (EGD);  Surgeon: Inda Castle, MD;  Location: [facility];  Service: Endoscopy;  Laterality: N/A;  . Botox injection N/A 01/25/2013    Procedure: BOTOX INJECTION;  Surgeon: Inda Castle, MD;  [facility];  Service: Endoscopy;  Laterality: N/A;  .  Esophagogastroduodenoscopy N/A 06/21/2013    Procedure: ESOPHAGOGASTRODUODENOSCOPY (EGD);  Surgeon: Inda Castle, MD;  Location: [facility];  Service: Endoscopy;  Laterality: N/A;  . Botox injection N/A 06/21/2013    Procedure: BOTOX INJECTION;  Surgeon: Inda Castle, MD;  [facility];  Service: Endoscopy;  Laterality: N/A;  . Esophagogastroduodenoscopy (egd) with propofol N/A 10/08/2013    Procedure: ESOPHAGOGASTRODUODENOSCOPY (EGD) WITH PROPOFOL;  Surgeon: Inda Castle, MD;  [facility];  Service: Endoscopy;  Laterality: N/A;  . Botox injection N/A 10/08/2013    Procedure: BOTOX INJECTION;  Surgeon: Inda Castle, MD;  [facility];  Service: Endoscopy;  Laterality: N/A;   Family History  Problem Relation Age of Onset  . Colon cancer Neg Hx   . Stomach cancer Maternal Grandmother     ?  . Diabetes Maternal Grandmother   . Arthritis Mother   . Hypertension Mother   . Heart disease Mother    History  Substance Use Topics  . Smoking status: Current Every Day Smoker -- 0.50 packs/day for 25 years    Types: Cigarettes  . Smokeless tobacco: Never Used     Comment: not ready  . Alcohol Use: No    Review of Systems  Constitutional: Negative for fever.  All other systems reviewed and are negative.     Allergies  Review of patient's allergies indicates no known allergies.  Home Medications   Prior to Admission medications   Medication Sig Start Date End Date Taking? Authorizing Provider  COMPLERA 200-25-300 MG TABS TAKE 1 TABLET BY MOUTH DAILY. 05/05/15  Yes Truman Hayward, MD  cyclobenzaprine (FLEXERIL) 10 MG tablet Take 1 tablet (10 mg total) by mouth at bedtime as needed for muscle spasms. 03/17/15  Yes Josalyn Funches, MD  diclofenac sodium (VOLTAREN) 1 % GEL Apply 2 g topically 2 (two) times daily. 04/07/15  Yes Campbell Riches, MD  diltiazem (CARDIZEM) 60 MG tablet Take 1 tablet (60 mg total) by mouth 3 (three) times daily.  03/25/15  Yes Amy S Esterwood, PA-C  dolutegravir (TIVICAY) 50 MG tablet Take 1 tablet (50 mg total) by mouth daily. 05/05/15  Yes Truman Hayward, MD  Naphazoline-Pheniramine (OPCON-A OP) Apply 1-2 drops to eye 2 (two) times daily as needed (dry eyes.).    Yes Historical Provider, MD  ranitidine (ZANTAC) 150 MG capsule Take 2 capsules (300 mg total) by mouth daily. Patient not taking: Reported on 05/16/2015 02/17/15   Merryl Hacker, MD  traMADol (ULTRAM) 50 MG tablet Take 1 tablet (50 mg total) by mouth every 8 (eight) hours as needed. Patient not taking: Reported on 05/16/2015 03/17/15   Josalyn Funches, MD   BP 132/76 mmHg  Pulse 73  Temp(Src) 98.4 F (36.9 C) (Oral)  Resp 16  Ht 6\' 1"  (1.854 m)  Wt 244 lb (110.678 kg)  BMI 32.20 kg/m2  SpO2 97% Physical Exam  Constitutional: He is oriented to person, place, and time. He appears well-developed  and well-nourished.  HENT:  Head: Normocephalic and atraumatic.  Eyes: Conjunctivae are normal.  Neck: Normal range of motion. Neck supple.  Cardiovascular: Normal rate, regular rhythm and normal heart sounds.   Pulmonary/Chest: Effort normal and breath sounds normal.  Abdominal: Soft. Normal appearance. He exhibits no distension and no mass. There is generalized tenderness. There is no rigidity, no rebound and no guarding.  Generalized abdominal tenderness.   Musculoskeletal: Normal range of motion.  Neurological: He is alert and oriented to person, place, and time.  Skin: Skin is warm and dry.  Nursing note and vitals reviewed.   ED Course  Procedures (including critical care time) Labs Review Labs Reviewed  CBC WITH DIFFERENTIAL/PLATELET - Abnormal; Notable for the following:    RDW 15.7 (*)    All other components within normal limits  COMPREHENSIVE METABOLIC PANEL - Abnormal; Notable for the following:    Glucose, Bld 113 (*)    Creatinine, Ser 1.33 (*)    All other components within normal limits  URINALYSIS, ROUTINE W  REFLEX MICROSCOPIC (NOT AT [facility]) - Abnormal; Notable for the following:    Hgb urine dipstick TRACE (*)    All other components within normal limits  URINE MICROSCOPIC-ADD ON    Imaging Review No results found.   EKG Interpretation None      MDM   Final diagnoses:  Generalized abdominal pain  Patient with history of HIV presents for generalized abdominal pain which she states is similar to episodes that he has had in the past. He states that it is relieved by morphine or Dilaudid. He had a normal endoscopy in December 2014 and was given a Botox injection.  Labs are unremarkable. I am not concerned about cholecystitis or appendicitis. He has no peritoneal signs.   Medications  gi cocktail (Maalox,Lidocaine,Donnatal) (30 mLs Oral Given 05/16/15 1120)  ondansetron (ZOFRAN-ODT) disintegrating tablet 4 mg (4 mg Oral Given 05/16/15 1119)  sodium chloride 0.9 % bolus 1,000 mL (0 mLs Intravenous Stopped 05/16/15 1339)  HYDROmorphone (DILAUDID) injection 1 mg (1 mg Intravenous Given 05/16/15 1248)  Recheck:  Patient states he is feeling much better and ready to go home. I discussed return precautions and follow up with pcp.  Patient verbally agrees with the plan.      Ottie Glazier, PA-C 05/16/15 1723  Milton Ferguson, MD 05/17/15 [phone]

## 2015-05-16 NOTE — Discharge Instructions (Signed)
Abdominal Pain Follow up with your primary care physician.  Many things can cause abdominal pain. Usually, abdominal pain is not caused by a disease and will improve without treatment. It can often be observed and treated at home. Your health care provider will do a physical exam and possibly order blood tests and X-rays to help determine the seriousness of your pain. However, in many cases, more time must pass before a clear cause of the pain can be found. Before that point, your health care provider may not know if you need more testing or further treatment. HOME CARE INSTRUCTIONS  Monitor your abdominal pain for any changes. The following actions may help to alleviate any discomfort you are experiencing:  Only take over-the-counter or prescription medicines as directed by your health care provider.  Do not take laxatives unless directed to do so by your health care provider.  Try a clear liquid diet (broth, tea, or water) as directed by your health care provider. Slowly move to a bland diet as tolerated. SEEK MEDICAL CARE IF:  You have unexplained abdominal pain.  You have abdominal pain associated with nausea or diarrhea.  You have pain when you urinate or have a bowel movement.  You experience abdominal pain that wakes you in the night.  You have abdominal pain that is worsened or improved by eating food.  You have abdominal pain that is worsened with eating fatty foods.  You have a fever. SEEK IMMEDIATE MEDICAL CARE IF:   Your pain does not go away within 2 hours.  You keep throwing up (vomiting).  Your pain is felt only in portions of the abdomen, such as the right side or the left lower portion of the abdomen.  You pass bloody or black tarry stools. MAKE SURE YOU:  Understand these instructions.   Will watch your condition.   Will get help right away if you are not doing well or get worse.  Document Released: 07/21/2005 Document Revised: 10/16/2013 Document  Reviewed: 06/20/2013 [facility] Patient Information 2015 [place], [place]. This information is not intended to replace advice given to you by your health care provider. Make sure you discuss any questions you have with your health care provider.

## 2015-05-20 ENCOUNTER — Encounter: Payer: Self-pay | Admitting: Gastroenterology

## 2015-05-23 ENCOUNTER — Ambulatory Visit (HOSPITAL_BASED_OUTPATIENT_CLINIC_OR_DEPARTMENT_OTHER): Payer: Medicaid Other | Attending: Family Medicine

## 2015-05-23 DIAGNOSIS — G4730 Sleep apnea, unspecified: Secondary | ICD-10-CM | POA: Diagnosis present

## 2015-05-23 DIAGNOSIS — G4733 Obstructive sleep apnea (adult) (pediatric): Secondary | ICD-10-CM | POA: Insufficient documentation

## 2015-05-24 ENCOUNTER — Encounter (HOSPITAL_COMMUNITY): Payer: Self-pay | Admitting: Emergency Medicine

## 2015-05-24 ENCOUNTER — Emergency Department (HOSPITAL_COMMUNITY)
Admission: EM | Admit: 2015-05-24 | Discharge: 2015-05-24 | Disposition: A | Discharge status: Home / Self Care | Payer: Medicaid Other | Attending: Physician Assistant | Admitting: Physician Assistant

## 2015-05-24 DIAGNOSIS — Z8719 Personal history of other diseases of the digestive system: Secondary | ICD-10-CM | POA: Insufficient documentation

## 2015-05-24 DIAGNOSIS — Z79899 Other long term (current) drug therapy: Secondary | ICD-10-CM | POA: Diagnosis not present

## 2015-05-24 DIAGNOSIS — Z720 Tobacco use: Secondary | ICD-10-CM | POA: Diagnosis not present

## 2015-05-24 DIAGNOSIS — J45909 Unspecified asthma, uncomplicated: Secondary | ICD-10-CM | POA: Diagnosis not present

## 2015-05-24 DIAGNOSIS — Z85828 Personal history of other malignant neoplasm of skin: Secondary | ICD-10-CM | POA: Insufficient documentation

## 2015-05-24 DIAGNOSIS — Z21 Asymptomatic human immunodeficiency virus [HIV] infection status: Secondary | ICD-10-CM | POA: Diagnosis not present

## 2015-05-24 DIAGNOSIS — J029 Acute pharyngitis, unspecified: Secondary | ICD-10-CM | POA: Diagnosis present

## 2015-05-24 DIAGNOSIS — Z8781 Personal history of (healed) traumatic fracture: Secondary | ICD-10-CM | POA: Diagnosis not present

## 2015-05-24 DIAGNOSIS — Z872 Personal history of diseases of the skin and subcutaneous tissue: Secondary | ICD-10-CM | POA: Insufficient documentation

## 2015-05-24 DIAGNOSIS — Z8619 Personal history of other infectious and parasitic diseases: Secondary | ICD-10-CM | POA: Diagnosis not present

## 2015-05-24 DIAGNOSIS — Z87438 Personal history of other diseases of male genital organs: Secondary | ICD-10-CM | POA: Diagnosis not present

## 2015-05-24 DIAGNOSIS — I10 Essential (primary) hypertension: Secondary | ICD-10-CM | POA: Insufficient documentation

## 2015-05-24 LAB — RAPID STREP SCREEN (MED CTR MEBANE ONLY): STREPTOCOCCUS, GROUP A SCREEN (DIRECT): NEGATIVE

## 2015-05-24 MED ORDER — IBUPROFEN 800 MG PO TABS
800.0000 mg | ORAL_TABLET | Freq: Once | ORAL | Status: AC
  Administered 2015-05-24: 800 mg via ORAL
  Filled 2015-05-24: qty 1

## 2015-05-24 MED ORDER — IBUPROFEN 400 MG PO TABS: 400.0000 mg | ORAL_TABLET | Freq: Four times a day (QID) | ORAL | Status: DC | PRN

## 2015-05-24 NOTE — ED Notes (Signed)
Awake. Verbally responsive. A/O x4. Resp even and unlabored. No audible adventitious breath sounds noted. ABC's intact. NAD noted. 

## 2015-05-24 NOTE — ED Notes (Signed)
Awake. Verbally responsive. A/O x4. Resp even and unlabored. No audible adventitious breath sounds noted. ABC's intact.  

## 2015-05-24 NOTE — ED Provider Notes (Signed)
CSN: [id number]     Arrival date & time 05/24/15  0559 History   First MD Initiated Contact with Patient 05/24/15 0701     Chief Complaint  Patient presents with  . Sore Throat     (Consider location/radiation/quality/duration/timing/severity/associated sxs/prior Treatment) HPI Benjamin Herrera is a 49 y.o. male who comes in for evaluation of sore throat. Patient states he has had a sore throat for the past 3 days. Reports she has done so were gargles at home for the discomfort. Reports he was at a sleep center also for the past 3 days. Denies any fevers, chills, nausea, vomiting, cough, difficulty swallowing, difficulty breathing or difficulties opening his jaw. No other aggravating or modifying factors.  Past Medical History  Diagnosis Date  . Polysubstance abuse   . Boils     under arms hx of  . Hemorrhoids   . Herpes labialis   . Psoriasis     hx of  . Achalasia and cardiospasm   . Candidiasis of unspecified site   . Dysphagia   . Asthma   . Candidiasis of mouth   . Fracture, humerus   . Hepatitis 1993    A  . 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 Pilonidal disease 01/10/2012  . Condyloma acuminatum in male of scrotum & anal canal s/p laser ablation 07/03/2012  . 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 HIV (human immunodeficiency virus infection) DX 1993  . Hypertension Dx 2014    off all bp meds for last 9 or 10 months   Past Surgical History  Procedure Laterality Date  . Humeral fracture surgery Right yrs ago  . Right shoulder replacement  2008    x 2  . Esophagogastroduodenoscopy  11/11/2011    Procedure: ESOPHAGOGASTRODUODENOSCOPY (EGD);  Surgeon: Inda Castle, MD;  Location: [facility];  Service: Endoscopy;  Laterality: N/A;  botox injection  called Pt to change time of procedure per Dr Deatra Ina  . Esophagogastroduodenoscopy  03/10/2012    Procedure: ESOPHAGOGASTRODUODENOSCOPY  (EGD);  Surgeon: Inda Castle, MD;  Location: [facility];  Service: Endoscopy;  Laterality: N/A;  . Colonoscopy  05/19/2012    Procedure: COLONOSCOPY;  Surgeon: Inda Castle, MD;  Location: [facility];  Service: Endoscopy;  Laterality: N/A;  jill trying to contact pt to come in 0830 for 930 case, phone not accepting messages  . Esophageal manometry  05/29/2012    Procedure: ESOPHAGEAL MANOMETRY (EM);  Surgeon: Inda Castle, MD;  Location: [facility];  Service: Endoscopy;  Laterality: N/A;  . Pilonidal cyst excision  07/24/2012    Procedure: CYST EXCISION PILONIDAL SIMPLE;  Surgeon: Adin Hector, MD;  Location: [facility];  Service: General;  Laterality: N/A;  Exam Under Anesthesia,, Excision Pilonidal Disease,   . Examination under anesthesia  07/24/2012    Procedure: EXAM UNDER ANESTHESIA;  Surgeon: Adin Hector, MD;  Location: [facility];  Service: General;  Laterality: N/A;  . Wart fulguration  07/24/2012    Procedure: FULGURATION ANAL WART;  Surgeon: Adin Hector, MD;  Location: [facility];  Service: General;  Laterality: N/A;  excision of raphe mass  . Esophagogastroduodenoscopy  10/11/2012    Procedure: ESOPHAGOGASTRODUODENOSCOPY (EGD);  Surgeon: Inda Castle, MD;  Location: [facility];  Service: Endoscopy;  Laterality: N/A;  . Botox injection  10/11/2012    Procedure: BOTOX INJECTION;  Surgeon: Inda Castle, MD;  Location: [facility];  Service: Endoscopy;  Laterality: N/A;  . Esophagogastroduodenoscopy N/A 01/25/2013    Procedure: ESOPHAGOGASTRODUODENOSCOPY (EGD);  Surgeon: Inda Castle, MD;  Location: [facility];  Service: Endoscopy;  Laterality: N/A;  . Botox injection N/A 01/25/2013    Procedure: BOTOX INJECTION;  Surgeon: Inda Castle, MD;  Location: [facility];  Service: Endoscopy;  Laterality: N/A;  . Esophagogastroduodenoscopy N/A 06/21/2013    Procedure: ESOPHAGOGASTRODUODENOSCOPY (EGD);  Surgeon: Inda Castle, MD;  Location: [facility];  Service:  Endoscopy;  Laterality: N/A;  . Botox injection N/A 06/21/2013    Procedure: BOTOX INJECTION;  Surgeon: Inda Castle, MD;  Location: [facility];  Service: Endoscopy;  Laterality: N/A;  . Esophagogastroduodenoscopy (egd) with propofol N/A 10/08/2013    Procedure: ESOPHAGOGASTRODUODENOSCOPY (EGD) WITH PROPOFOL;  Surgeon: Inda Castle, MD;  Location: [facility];  Service: Endoscopy;  Laterality: N/A;  . Botox injection N/A 10/08/2013    Procedure: BOTOX INJECTION;  Surgeon: Inda Castle, MD;  Location: [facility];  Service: Endoscopy;  Laterality: N/A;   Family History  Problem Relation Age of Onset  . Colon cancer Neg Hx   . Stomach cancer Maternal Grandmother     ?  . Diabetes Maternal Grandmother   . Arthritis Mother   . Hypertension Mother   . Heart disease Mother    History  Substance Use Topics  . Smoking status: Current Every Day Smoker -- 0.50 packs/day for 25 years    Types: Cigarettes  . Smokeless tobacco: Never Used     Comment: not ready  . Alcohol Use: No    Review of Systems A 10 point review of systems was completed and was negative except for pertinent positives and negatives as mentioned in the history of present illness     Allergies  Review of patient's allergies indicates no known allergies.  Home Medications   Prior to Admission medications   Medication Sig Start Date End Date Taking? Authorizing Provider  COMPLERA 200-25-300 MG TABS TAKE 1 TABLET BY MOUTH DAILY. 05/05/15  Yes Truman Hayward, MD  diclofenac sodium (VOLTAREN) 1 % GEL Apply 2 g topically 2 (two) times daily. 04/07/15  Yes Campbell Riches, MD  diltiazem (CARDIZEM) 60 MG tablet Take 1 tablet (60 mg total) by mouth 3 (three) times daily. 03/25/15  Yes Amy S Esterwood, PA-C  dolutegravir (TIVICAY) 50 MG tablet Take 1 tablet (50 mg total) by mouth daily. 05/05/15  Yes Truman Hayward, MD  Naphazoline-Pheniramine (OPCON-A OP) Apply 1-2 drops to eye 2 (two) times daily as  needed (dry eyes.).    Yes Historical Provider, MD  cyclobenzaprine (FLEXERIL) 10 MG tablet Take 1 tablet (10 mg total) by mouth at bedtime as needed for muscle spasms. 03/17/15   Josalyn Funches, MD  ibuprofen (ADVIL,MOTRIN) 400 MG tablet Take 1 tablet (400 mg total) by mouth every 6 (six) hours as needed. 05/24/15   Comer Locket, PA-C  ranitidine (ZANTAC) 150 MG capsule Take 2 capsules (300 mg total) by mouth daily. Patient not taking: Reported on 05/16/2015 02/17/15   Merryl Hacker, MD  traMADol (ULTRAM) 50 MG tablet Take 1 tablet (50 mg total) by mouth every 8 (eight) hours as needed. Patient not taking: Reported on 05/16/2015 03/17/15   Josalyn Funches, MD   BP 149/78 mmHg  Pulse 90  Temp(Src) 98 F (36.7 C) (Oral)  Resp 18  SpO2 98% Physical Exam  Constitutional:  Awake, alert, nontoxic appearance.  HENT:  Head: Atraumatic.  Oropharynx is clear and moist. No tonsillitis, no trismus, no glossal elevation, uvula is midline. Tolerating secretions well  Eyes: Right eye exhibits no discharge. Left eye exhibits no discharge.  Neck: Neck supple.  Pulmonary/Chest: Effort normal. He exhibits no tenderness.  Abdominal: Soft. There is no tenderness. There is no rebound.  Musculoskeletal: He exhibits no tenderness.  Baseline ROM, no obvious new focal weakness.  Neurological:  Mental status and motor strength appears baseline for patient and situation.  Skin: No rash noted.  Psychiatric: He has a normal mood and affect.  Nursing note and vitals reviewed.   ED Course  Procedures (including critical care time) Labs Review Labs Reviewed  RAPID STREP SCREEN (NOT AT [facility])  CULTURE, GROUP A STREP    Imaging Review No results found.   EKG Interpretation None      MDM  Vitals stable - WNL -afebrile Pt resting comfortably in ED. PE--physical exam as above and is not concerning. No unilateral tonsillar swelling, trismus, glossal elevation. Labwork--rapid strep  negative.  Patient with three-day history of sore throat, likely suffering from a viral pharyngitis. No evidence of retropharyngeal, peritonsillar abscess, Ludwig angina or other acute or emergent intraoral pathology. Given ibuprofen in the ED and discharged with the same encourage continued solar gargles. I discussed all relevant lab findings and imaging results with pt and they verbalized understanding. Discussed f/u with PCP within 48 hrs and return precautions, pt very amenable to plan.  Final diagnoses:  Pharyngitis        Comer Locket, PA-C 05/24/15 3546  Courteney Julio Alm, MD 05/24/15 1520

## 2015-05-24 NOTE — ED Notes (Signed)
Pt arrived to teh ED with a complaint of a sore throat.  Pt was just at the sleep center.  Pt states that hte throat has been bothering him for four days.

## 2015-05-24 NOTE — Discharge Instructions (Signed)
Pharyngitis °Pharyngitis is redness, pain, and swelling (inflammation) of your pharynx.  °CAUSES  °Pharyngitis is usually caused by infection. Most of the time, these infections are from viruses (viral) and are part of a cold. However, sometimes pharyngitis is caused by bacteria (bacterial). Pharyngitis can also be caused by allergies. Viral pharyngitis may be spread from person to person by coughing, sneezing, and personal items or utensils (cups, forks, spoons, toothbrushes). Bacterial pharyngitis may be spread from person to person by more intimate contact, such as kissing.  °SIGNS AND SYMPTOMS  °Symptoms of pharyngitis include:   °· Sore throat.   °· Tiredness (fatigue).   °· Low-grade fever.   °· Headache. °· Joint pain and muscle aches. °· Skin rashes. °· Swollen lymph nodes. °· Plaque-like film on throat or tonsils (often seen with bacterial pharyngitis). °DIAGNOSIS  °Your health care provider will ask you questions about your illness and your symptoms. Your medical history, along with a physical exam, is often all that is needed to diagnose pharyngitis. Sometimes, a rapid strep test is done. Other lab tests may also be done, depending on the suspected cause.  °TREATMENT  °Viral pharyngitis will usually get better in 3-4 days without the use of medicine. Bacterial pharyngitis is treated with medicines that kill germs (antibiotics).  °HOME CARE INSTRUCTIONS  °· Drink enough water and fluids to keep your urine clear or pale yellow.   °· Only take over-the-counter or prescription medicines as directed by your health care provider:   °¨ If you are prescribed antibiotics, make sure you finish them even if you start to feel better.   °¨ Do not take aspirin.   °· Get lots of rest.   °· Gargle with 8 oz of salt water (½ tsp of salt per 1 qt of water) as often as every 1-2 hours to soothe your throat.   °· Throat lozenges (if you are not at risk for choking) or sprays may be used to soothe your throat. °SEEK MEDICAL  CARE IF:  °· You have large, tender lumps in your neck. °· You have a rash. °· You cough up green, yellow-brown, or bloody spit. °SEEK IMMEDIATE MEDICAL CARE IF:  °· Your neck becomes stiff. °· You drool or are unable to swallow liquids. °· You vomit or are unable to keep medicines or liquids down. °· You have severe pain that does not go away with the use of recommended medicines. °· You have trouble breathing (not caused by a stuffy nose). °MAKE SURE YOU:  °· Understand these instructions. °· Will watch your condition. °· Will get help right away if you are not doing well or get worse. °Document Released: 10/11/2005 Document Revised: 08/01/2013 Document Reviewed: 06/18/2013 °ExitCare® Patient Information ©2015 [employer]. This information is not intended to replace advice given to you by your health care provider. Make sure you discuss any questions you have with your health care provider. ° °Salt Water Gargle °This solution will help make your mouth and throat feel better. °HOME CARE INSTRUCTIONS  °· Mix 1 teaspoon of salt in 8 ounces of warm water. °· Gargle with this solution as much or often as you need or as directed. Swish and gargle gently if you have any sores or wounds in your mouth. °· Do not swallow this mixture. °Document Released: 07/15/2004 Document Revised: 01/03/2012 Document Reviewed: 12/06/2008 °ExitCare® Patient Information ©2015 [employer]. This information is not intended to replace advice given to you by your health care provider. Make sure you discuss any questions you have with your health care provider. ° °

## 2015-05-26 LAB — CULTURE, GROUP A STREP: STREP A CULTURE: NEGATIVE

## 2015-05-31 ENCOUNTER — Ambulatory Visit (HOSPITAL_BASED_OUTPATIENT_CLINIC_OR_DEPARTMENT_OTHER): Payer: Medicaid Other | Admitting: Internal Medicine

## 2015-05-31 DIAGNOSIS — G4730 Sleep apnea, unspecified: Secondary | ICD-10-CM | POA: Diagnosis not present

## 2015-05-31 NOTE — Progress Notes (Signed)
    Patient Name: Benjamin Herrera, Benjamin Herrera Date: 05/23/2015 Gender: Male D.O.B: 09-26-1966 Age (years): 6 Referring Provider: Boykin Nearing, MD Height (inches): 71 Interpreting Physician: Baird Lyons MD, ABSM Weight (lbs): 247 RPSGT: Baxter Flattery BMI: 34 MRN: [id number] Neck Size: 17.00 CLINICAL INFORMATION The patient is referred for a CPAP titration to treat sleep apnea.  Date of NPSG, Split Night or HST:  12/25/14, AHI 9.7/ hr, weight 234 lbs  SLEEP STUDY TECHNIQUE As per the AASM Manual for the Scoring of Sleep and Associated Events v2.3 (April 2016) with a hypopnea requiring 4% desaturations.  The channels recorded and monitored were frontal, central and occipital EEG, electrooculogram (EOG), submentalis EMG (chin), nasal and oral airflow, thoracic and abdominal wall motion, anterior tibialis EMG, snore microphone, electrocardiogram, and pulse oximetry. Continuous positive airway pressure (CPAP) was initiated at the beginning of the study and titrated to treat sleep-disordered breathing.  MEDICATIONS Medications taken by the patient : Charted for review Medications administered by patient during sleep study : No sleep medicine administered.  TECHNICIAN COMMENTS Comments added by technician: Patient had more than two awakenings to use the bathroom Comments added by scorer: N/A  RESPIRATORY PARAMETERS Optimal PAP Pressure (cm): 10 AHI at Optimal Pressure (/hr): 1.6 Overall Minimal O2 (%): 89.00 Supine % at Optimal Pressure (%): 0 Minimal O2 at Optimal Pressure (%): 93.0    SLEEP ARCHITECTURE The study was initiated at 11:02:50 PM and ended at 5:03:34 AM.  Sleep onset time was 3.2 minutes and the sleep efficiency was 69.2%. The total sleep time was 249.5 minutes.  The patient spent 6.81% of the night in stage N1 sleep, 60.92% in stage N2 sleep, 0.00% in stage N3 and 32.27% in REM.Stage REM latency was 52.0 minutes  Wake after sleep onset was 108.0. Alpha intrusion was  absent. Supine sleep was 78.36%.  CARDIAC DATA The 2 lead EKG demonstrated sinus rhythm. The mean heart rate was 77.19 beats per minute. Other EKG findings include: None.  LEG MOVEMENT DATA The total Periodic Limb Movements of Sleep (PLMS) were 33. The PLMS index was 7.94. A PLMS index of <15 is considered normal in adults.  IMPRESSIONS The optimal PAP pressure was 10 cm of water. Central sleep apnea was not noted during this titration (CAI = 1.2/h). Mild oxygen desaturations were observed during this titration (min O2 = 89.00%). No snoring was audible during this study. No cardiac abnormalities were observed during this study. Mild periodic limb movements were observed during this study. Arousals associated with PLMs were rare. Difficulty maintaining sleep, awake after sleep onset 108 minutes. Insomnia component may need to be addressed if persistent. Frequent nocturia may interfere with CPAP and may need to be addressed  DIAGNOSIS Obstructive Sleep Apnea (327.23 [G47.33 ICD-10])  RECOMMENDATIONS Trial of CPAP therapy on 10 cm H2O with a Medium size Fisher&Paykel Full Face Mask Simplus mask and heated humidification. Avoid alcohol, sedatives and other CNS depressants that may worsen sleep apnea and disrupt normal sleep architecture. Sleep hygiene should be reviewed to assess factors that may improve sleep quality. Weight management and regular exercise should be initiated or continued.   Deneise Lever Diplomate, American Board of Sleep Medicine  ELECTRONICALLY SIGNED ON:  05/31/2015, 4:44 PM [place] PH: [phone]   FX: [phone] [place]

## 2015-06-01 ENCOUNTER — Ambulatory Visit: Payer: Medicaid Other | Admitting: Internal Medicine

## 2015-06-04 ENCOUNTER — Telehealth: Payer: Self-pay | Admitting: Family Medicine

## 2015-06-04 DIAGNOSIS — G4730 Sleep apnea, unspecified: Secondary | ICD-10-CM

## 2015-06-04 NOTE — Telephone Encounter (Signed)
Pt calling for sleep study results. Please call back

## 2015-06-11 ENCOUNTER — Encounter (HOSPITAL_COMMUNITY): Payer: Self-pay | Admitting: *Deleted

## 2015-06-11 ENCOUNTER — Telehealth: Payer: Self-pay | Admitting: Family Medicine

## 2015-06-11 ENCOUNTER — Ambulatory Visit (HOSPITAL_BASED_OUTPATIENT_CLINIC_OR_DEPARTMENT_OTHER): Payer: Medicaid Other

## 2015-06-11 NOTE — Telephone Encounter (Signed)
Patient called requesting results from lab. Please follow up.

## 2015-06-16 ENCOUNTER — Encounter (HOSPITAL_COMMUNITY): Payer: Self-pay

## 2015-06-16 ENCOUNTER — Ambulatory Visit (HOSPITAL_COMMUNITY): Payer: Medicaid Other | Admitting: Anesthesiology

## 2015-06-16 ENCOUNTER — Ambulatory Visit (HOSPITAL_COMMUNITY)
Admission: RE | Admit: 2015-06-16 | Discharge: 2015-06-16 | Disposition: A | Discharge status: Home / Self Care | Payer: Medicaid Other | Source: Ambulatory Visit | Attending: Gastroenterology | Admitting: Gastroenterology

## 2015-06-16 ENCOUNTER — Encounter (HOSPITAL_COMMUNITY)
Admission: RE | Disposition: A | Discharge status: Home / Self Care | Payer: Self-pay | Source: Ambulatory Visit | Attending: Gastroenterology

## 2015-06-16 DIAGNOSIS — B20 Human immunodeficiency virus [HIV] disease: Secondary | ICD-10-CM | POA: Diagnosis not present

## 2015-06-16 DIAGNOSIS — G4730 Sleep apnea, unspecified: Secondary | ICD-10-CM | POA: Insufficient documentation

## 2015-06-16 DIAGNOSIS — Z79899 Other long term (current) drug therapy: Secondary | ICD-10-CM | POA: Diagnosis not present

## 2015-06-16 DIAGNOSIS — Z791 Long term (current) use of non-steroidal anti-inflammatories (NSAID): Secondary | ICD-10-CM | POA: Diagnosis not present

## 2015-06-16 DIAGNOSIS — K222 Esophageal obstruction: Secondary | ICD-10-CM | POA: Diagnosis not present

## 2015-06-16 DIAGNOSIS — K220 Achalasia of cardia: Secondary | ICD-10-CM | POA: Insufficient documentation

## 2015-06-16 DIAGNOSIS — R1310 Dysphagia, unspecified: Secondary | ICD-10-CM | POA: Diagnosis not present

## 2015-06-16 DIAGNOSIS — G8929 Other chronic pain: Secondary | ICD-10-CM | POA: Insufficient documentation

## 2015-06-16 DIAGNOSIS — I10 Essential (primary) hypertension: Secondary | ICD-10-CM | POA: Diagnosis not present

## 2015-06-16 DIAGNOSIS — J45909 Unspecified asthma, uncomplicated: Secondary | ICD-10-CM | POA: Insufficient documentation

## 2015-06-16 DIAGNOSIS — E669 Obesity, unspecified: Secondary | ICD-10-CM | POA: Diagnosis not present

## 2015-06-16 DIAGNOSIS — F17210 Nicotine dependence, cigarettes, uncomplicated: Secondary | ICD-10-CM | POA: Insufficient documentation

## 2015-06-16 DIAGNOSIS — K219 Gastro-esophageal reflux disease without esophagitis: Secondary | ICD-10-CM | POA: Diagnosis not present

## 2015-06-16 DIAGNOSIS — R55 Syncope and collapse: Secondary | ICD-10-CM | POA: Diagnosis not present

## 2015-06-16 DIAGNOSIS — R109 Unspecified abdominal pain: Secondary | ICD-10-CM | POA: Diagnosis not present

## 2015-06-16 DIAGNOSIS — R0989 Other specified symptoms and signs involving the circulatory and respiratory systems: Secondary | ICD-10-CM | POA: Diagnosis not present

## 2015-06-16 DIAGNOSIS — Z6831 Body mass index (BMI) 31.0-31.9, adult: Secondary | ICD-10-CM | POA: Diagnosis not present

## 2015-06-16 DIAGNOSIS — M545 Low back pain: Secondary | ICD-10-CM | POA: Insufficient documentation

## 2015-06-16 HISTORY — PX: BOTOX INJECTION: SHX5754

## 2015-06-16 HISTORY — DX: Sleep apnea, unspecified: G47.30

## 2015-06-16 HISTORY — PX: ESOPHAGOGASTRODUODENOSCOPY (EGD) WITH PROPOFOL: SHX5813

## 2015-06-16 SURGERY — ESOPHAGOGASTRODUODENOSCOPY (EGD) WITH PROPOFOL
Anesthesia: Monitor Anesthesia Care

## 2015-06-16 MED ORDER — SODIUM CHLORIDE 0.9 % IV SOLN: INTRAVENOUS | Status: DC

## 2015-06-16 MED ORDER — PROPOFOL 10 MG/ML IV BOLUS
INTRAVENOUS | Status: AC
  Filled 2015-06-16: qty 20

## 2015-06-16 MED ORDER — PROPOFOL 500 MG/50ML IV EMUL
INTRAVENOUS | Status: DC | PRN
  Administered 2015-06-16: 60 mg via INTRAVENOUS

## 2015-06-16 MED ORDER — SODIUM CHLORIDE 0.9 % IJ SOLN
100.0000 [IU] | Freq: Once | INTRAMUSCULAR | Status: AC
  Administered 2015-06-16: 100 [IU] via SUBMUCOSAL
  Filled 2015-06-16: qty 100

## 2015-06-16 MED ORDER — MIDAZOLAM HCL 2 MG/2ML IJ SOLN
INTRAMUSCULAR | Status: AC
  Filled 2015-06-16: qty 4

## 2015-06-16 MED ORDER — LACTATED RINGERS IV SOLN: INTRAVENOUS | Status: DC

## 2015-06-16 MED ORDER — MIDAZOLAM HCL 5 MG/5ML IJ SOLN
INTRAMUSCULAR | Status: DC | PRN
  Administered 2015-06-16: 2 mg via INTRAVENOUS

## 2015-06-16 MED ORDER — ONDANSETRON HCL 4 MG/2ML IJ SOLN
INTRAMUSCULAR | Status: DC | PRN
  Administered 2015-06-16: 4 mg via INTRAVENOUS

## 2015-06-16 MED ORDER — PROMETHAZINE HCL 25 MG/ML IJ SOLN: 6.2500 mg | INTRAMUSCULAR | Status: DC | PRN

## 2015-06-16 MED ORDER — HYOSCYAMINE SULFATE 0.125 MG SL SUBL
0.2500 mg | SUBLINGUAL_TABLET | Freq: Once | SUBLINGUAL | Status: AC
  Administered 2015-06-16: 0.25 mg via ORAL
  Filled 2015-06-16: qty 2

## 2015-06-16 MED ORDER — LACTATED RINGERS IV SOLN
INTRAVENOUS | Status: DC
  Administered 2015-06-16: 1000 mL via INTRAVENOUS
  Administered 2015-06-16: 12:00:00 via INTRAVENOUS

## 2015-06-16 MED ORDER — PROPOFOL INFUSION 10 MG/ML OPTIME
INTRAVENOUS | Status: DC | PRN
  Administered 2015-06-16: 200 ug/kg/min via INTRAVENOUS

## 2015-06-16 SURGICAL SUPPLY — 14 items

## 2015-06-16 NOTE — Discharge Instructions (Signed)
Esophagogastroduodenoscopy °Care After °Refer to this sheet in the next few weeks. These instructions provide you with information on caring for yourself after your procedure. Your caregiver may also give you more specific instructions. Your treatment has been planned according to current medical practices, but problems sometimes occur. Call your caregiver if you have any problems or questions after your procedure.  °HOME CARE INSTRUCTIONS °· Do not eat or drink anything until the numbing medicine (local anesthetic) has worn off and your gag reflex has returned. You will know that the local anesthetic has worn off when you can swallow comfortably. °· Do not drive for 12 hours after the procedure or as directed by your caregiver. °· Only take medicines as directed by your caregiver. °SEEK MEDICAL CARE IF:  °· You cannot stop coughing. °· You are not urinating at all or less than usual. °SEEK IMMEDIATE MEDICAL CARE IF: °· You have difficulty swallowing. °· You cannot eat or drink. °· You have worsening throat or chest pain. °· You have dizziness, lightheadedness, or you faint. °· You have nausea or vomiting. °· You have chills. °· You have a fever. °· You have severe abdominal pain. °· You have black, tarry, or bloody stools. °Document Released: 09/27/2012 Document Reviewed: 09/27/2012 °ExitCare® Patient Information ©2015 [employer]. This information is not intended to replace advice given to you by your health care provider. Make sure you discuss any questions you have with your health care provider. ° °

## 2015-06-16 NOTE — Telephone Encounter (Signed)
Patient called to request his sleep study results, please f/u with pt.

## 2015-06-16 NOTE — H&P (Signed)
Patient ID: Benjamin Herrera, male DOB: [birth year], 49 y.o. MRN: [id number]  HPI Benjamin Herrera is a 49 year old African-American male known to Dr. Deatra Ina. He has not been seen here since 2014. He does have a diagnosis of achalasia and has undergone previous Botox injections with success. His last procedure was done in December 2014 with Botox injection. He was subsequently referred to [facility] for discussion regarding balloon dilation but was told that his disease was not severe enough to warrant that or surgery at present. In the interim he has been using Cardizem 60 mg 4 times daily which she says is helpful. He has been having problems recently with frequent nighttime waking with gagging and choking. He says he gets up very quickly trying to get to the bathroom in case he vomits and has had a couple of syncopal episodes over the past month. He says when he wakes up he just has been gagging choking sensation does not feel short of breath have chest pain headache etc. says he gets up once to the bathroom and has woken up on the bathroom floor for couple of times over the past month. If he does not use the Cardizem he says he will have difficulty swallowing liquids and swallow solids. Exline He also would like evaluation for a recurrent sharp mid abdominal pain which she says he gets about every 5-6 months and usually has to go to the emergency room for. He says his pain is located in his mid abdomen very intense associated with nausea or vomiting and resolves after he gets morphine in the emergency room. He did have a CT scan of the abdomen and pelvis within the past 2 months which was unremarkable. Prior colonoscopy had been done and normal in 2013.  Review of Systems Pertinent positive and negative review of systems were noted in the above HPI section. All other review of systems was otherwise negative.  Outpatient Encounter Prescriptions as of 03/25/2015  Medication Sig  . cyclobenzaprine  (FLEXERIL) 10 MG tablet Take 1 tablet (10 mg total) by mouth at bedtime as needed for muscle spasms.  . diclofenac sodium (VOLTAREN) 1 % GEL Apply 2 g topically 2 (two) times daily.  Marland Kitchen diltiazem (CARDIZEM) 60 MG tablet Take 1 tablet (60 mg total) by mouth 3 (three) times daily.  . Emtricitab-Rilpivir-Tenofovir (COMPLERA) 200-25-300 MG TABS Take 1 tablet by mouth daily.  . Naphazoline-Pheniramine (OPCON-A OP) Apply 1-2 drops to eye 3 (three) times daily as needed (dry eyes.).  Marland Kitchen ranitidine (ZANTAC) 150 MG capsule Take 2 capsules (300 mg total) by mouth daily.  Marland Kitchen TIVICAY 50 MG tablet TAKE 1 TABLET (50 MG TOTAL) BY MOUTH DAILY.  . traMADol (ULTRAM) 50 MG tablet Take 1 tablet (50 mg total) by mouth every 8 (eight) hours as needed.  . [DISCONTINUED] diltiazem (CARDIZEM) 60 MG tablet TAKE 1 TABLET (60 MG TOTAL) BY MOUTH 4 (FOUR) TIMES DAILY.   No facility-administered encounter medications on file as of 03/25/2015.   No Known Allergies Patient Active Problem List   Diagnosis Date Noted  . GERD (gastroesophageal reflux disease) 01/10/2015  . Chronic low back pain 10/07/2014  . Syncope and collapse 10/07/2014  . Mild sleep apnea 10/07/2014  . 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  . Numbness and tingling in left hand 02/12/2013  . Myalgia 02/12/2013  . Internal hemorrhoids with bleeding 01/30/2013  . 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 Condyloma acuminatum in male  of scrotum & anal canal s/p laser ablation 07/03/2012  . HTN (hypertension) 08/31/2010  . PSORIASIS 11/20/2009  . Achalasia and cardiospasm 08/14/2009  . OTHER CANDIDIASIS OF OTHER SPECIFIED SITES 06/26/2009  . Marijuana use 05/16/2008  . ASTHMA, UNSPECIFIED, UNSPECIFIED STATUS 05/16/2008  . HIV disease 09/03/2006  . FRACTURE, HUMERUS 09/03/2006   History   Social  History  . Marital Status: Single    Spouse Name: N/A  . Number of Children: N/A  . Years of Education: N/A   Occupational History  . Not on file.   Social History Main Topics  . Smoking status: Current Every Day Smoker -- 0.50 packs/day for 25 years    Types: Cigarettes  . Smokeless tobacco: Never Used     Comment: not ready  . Alcohol Use: No  . Drug Use: 7.00 per week    Special: Marijuana     Comment: pt now denies cocaine use , last cocaine use 4 yrs ago, occasional marijuana use  . Sexual Activity:    Partners: Male     Comment: pt. given condoms   Other Topics Concern  . Not on file   Social History Narrative    Benjamin Herrera family history includes Arthritis in his mother; Diabetes in his maternal grandmother; Heart disease in his mother; Hypertension in his mother; Stomach cancer in his maternal grandmother. There is no history of Colon cancer.      Objective:    Filed Vitals:   03/25/15 0957  BP: 110/68  Pulse: 80    Physical Exam well-developed African American male in no acute distress, blood pressure 110/68 pulse 80 height 6 foot 1 weight 246. HEENT ;nontraumatic normocephalic EOMI PERRLA sclera anicteric, Cardiovascular ;regular rate and rhythm with S1-S2 no murmur or gallop, Pulmonary; clear bilaterally, Abdomen ;soft nontender nondistended bowel sounds are active no palpable mass or hepatosplenomegaly, Rectal ;exam not done, Ext; no clubbing cyanosis or edema skin warm and dry, Psych ;mood and affect appropriate       Assessment & Plan:   #1 49 yo AA male with HIV with known Achalasia. Last EGD with Botox 09/2013. Generally has symptom control on cardizem . Now with recurrent nocturnal episodes of gaging and choking And several syncopal episodes - I suspect syncope secondary to orthostatic hypotension- perhaps being aggravated by nightime dose of cardizem 60 mg #2  HTN #3 episodic mid abdominal pain of unclear etiology- negative CT- r/o biliary dyskinesia #4 GERD  Plan; stop bedtime dose of cardizem , continue Cardizem TID 60 mg For now Schedule for EGD with Botox with Dr. Deatra Ina at [place] 45 degrees Schedule for [facility] HIDA

## 2015-06-16 NOTE — Anesthesia Preprocedure Evaluation (Signed)
Anesthesia Evaluation  Patient identified by MRN, date of birth, ID band Patient awake  General Assessment Comment:HIV positive.  Reviewed: Allergy & Precautions, H&P , NPO status , Patient's Chart, lab work & pertinent test results  Airway Mallampati: II  TM Distance: >3 FB Neck ROM: Full    Dental  (+) Partial Lower, Partial Upper   Pulmonary asthma , Current Smoker,  breath sounds clear to auscultation  Pulmonary exam normal       Cardiovascular Exercise Tolerance: Good hypertension, negative cardio ROS Normal cardiovascular examRhythm:Regular Rate:Normal     Neuro/Psych negative neurological ROS  negative psych ROS   GI/Hepatic (+) Hepatitis -  Endo/Other  negative endocrine ROS  Renal/GU Renal disease  negative genitourinary   Musculoskeletal negative musculoskeletal ROS (+)   Abdominal (+) + obese,   Peds negative pediatric ROS (+)  Hematology  (+) HIV,   Anesthesia Other Findings   Reproductive/Obstetrics negative OB ROS                             Anesthesia Physical  Anesthesia Plan  ASA: III  Anesthesia Plan: MAC   Post-op Pain Management:    Induction: Intravenous  Airway Management Planned:   Additional Equipment:   Intra-op Plan:   Post-operative Plan:   Informed Consent: I have reviewed the patients History and Physical, chart, labs and discussed the procedure including the risks, benefits and alternatives for the proposed anesthesia with the patient or authorized representative who has indicated his/her understanding and acceptance.   Dental advisory given  Plan Discussed with: CRNA  Anesthesia Plan Comments:         Anesthesia Quick Evaluation

## 2015-06-16 NOTE — Anesthesia Postprocedure Evaluation (Signed)
  Anesthesia Post-op Note  Patient: Benjamin Herrera  Procedure(s) Performed: Procedure(s) (LRB): ESOPHAGOGASTRODUODENOSCOPY (EGD) WITH PROPOFOL (N/A) BOTOX INJECTION (N/A)  Patient Location: PACU  Anesthesia Type: MAC  Level of Consciousness: awake and alert   Airway and Oxygen Therapy: Patient Spontanous Breathing  Post-op Pain: mild  Post-op Assessment: Post-op Vital signs reviewed, Patient's Cardiovascular Status Stable, Respiratory Function Stable, Patent Airway and No signs of Nausea or vomiting  Last Vitals:  Filed Vitals:   06/16/15 1314  BP: 157/72  Pulse:   Temp:   Resp: 19    Post-op Vital Signs: stable   Complications: No apparent anesthesia complications

## 2015-06-16 NOTE — Transfer of Care (Signed)
Immediate Anesthesia Transfer of Care Note  Patient: Benjamin Herrera  Procedure(s) Performed: Procedure(s): ESOPHAGOGASTRODUODENOSCOPY (EGD) WITH PROPOFOL (N/A) BOTOX INJECTION (N/A)  Patient Location: PACU  Anesthesia Type:MAC  Level of Consciousness:  sedated, patient cooperative and responds to stimulation  Airway & Oxygen Therapy:Patient Spontanous Breathing and Patient connected to face mask oxgen  Post-op Assessment:  Report given to PACU RN and Post -op Vital signs reviewed and stable  Post vital signs:  Reviewed and stable  Last Vitals:  Filed Vitals:   06/16/15 1145  BP: 184/97  Pulse: 77  Temp: 36.7 C  Resp: 24    Complications: No apparent anesthesia complications

## 2015-06-16 NOTE — Op Note (Signed)
[facility] [place], [address]   ENDOSCOPY PROCEDURE REPORT  PATIENT: Benjamin Herrera, Benjamin Herrera  MR#: [id number] BIRTHDATE: October 10, 1966 , 49  yrs. old GENDER: male ENDOSCOPIST: Inda Castle, MD REFERRED BY: PROCEDURE DATE:  06/16/2015 PROCEDURE:  EGD w/ directed submucosal injection(s), any substance (botox) ASA CLASS:     Class II INDICATIONS:  dysphagia. MEDICATIONS: Monitored anesthesia care TOPICAL ANESTHETIC:  DESCRIPTION OF PROCEDURE: After the risks benefits and alternatives of the procedure were thoroughly explained, informed consent was obtained.  The Pentax Gastroscope [id number] endoscope was introduced through the mouth and advanced to the second portion of the duodenum , Without limitations.  The instrument was slowly withdrawn as the mucosa was fully examined.    The GE junction was slightly stenotic, located at 44cm from incisors. Each quadrant in the GE junction was injected with 25units (1cc) botox, submucosally.   Except for the findings listed, the EGD was otherwise normal.  Retroflexed views revealed no abnormalities. The scope was then withdrawn from the patient and the procedure completed.  COMPLICATIONS: There were no immediate complications.  ENDOSCOPIC IMPRESSION: achalasia - s/p botox injection  RECOMMENDATIONS: OV 6-8 weeks abdominal ultrasound for evaluation of RUQ pain  REPEAT EXAM:  eSigned:  Inda Castle, MD 06/16/2015 12:39 PM    CC:

## 2015-06-17 ENCOUNTER — Encounter (HOSPITAL_COMMUNITY): Payer: Self-pay | Admitting: Gastroenterology

## 2015-06-17 NOTE — Telephone Encounter (Signed)
Patient called to request his sleep study results, patient was upset that he has not received a phone call back and began cussing. Patient was asked if he wanted to speak to practice manager but hung up before call was transferred. Please f/u with pt.

## 2015-06-19 NOTE — Telephone Encounter (Signed)
Called back to patient, We reviewed sleep study and plan for [facility] as patient has medicaid.  Patient agrees with plan and voiced understanding   Additionally, patient apologizes for "angry messages". He reports that he was frustrated.  Patient frustration acknowledged, and I apologized for delay in getting back to patient. I informed patient that the front office staff documented and routed messages appropriately. The delay was between my RMA and myself.  Finally, I warned the patient that using profanity while leaving messages is considered abuse of the staff and will not be tolerated. Patient reports that he was cursing at the situation and not the staff. He now knows that profanity and any form will not be tolerated.  It is my recommendation that if profanity occurs again, it is to be documented, and the patient is to be discharged from the practice.    [facility]ed [facility], Verified fax # [phone], based on patient zip, and required information -sleep study report, order with NPI # and my signature, patient insurance information, patient facesheet    Information placed in to be faxed box today

## 2015-06-25 ENCOUNTER — Telehealth: Payer: Self-pay | Admitting: Family Medicine

## 2015-06-25 DIAGNOSIS — G4730 Sleep apnea, unspecified: Secondary | ICD-10-CM

## 2015-06-25 NOTE — Telephone Encounter (Signed)
Patient called to check on the following request:  Benjamin Herrera from [place] [facility] called requesting to speak to nurse, states pressure setting for CPAP was missing, to resend and to be intialed and dated phone number is [phone]  Please f/u with pt.

## 2015-06-25 NOTE — Telephone Encounter (Signed)
Benjamin Herrera from [place] [facility] called requesting to speak to nurse, states pressure setting for CPAP was missing, to resend and to be  intialed and dated  phone number is [phone]

## 2015-06-26 NOTE — Telephone Encounter (Signed)
New Rx provided with pressure support written.

## 2015-07-07 ENCOUNTER — Other Ambulatory Visit: Payer: Self-pay | Admitting: Surgery

## 2015-07-07 NOTE — H&P (Signed)
Benjamin Herrera 07/07/2015 12:03 PM Location: [facility] Patient #: [id number] DOB: 1966-06-11 Single / Language: Cleophus Molt / Race: Black or African American Male History of Present Illness Adin Hector MD; 07/07/2015 12:34 PM) The patient is a 49 year old male who presents with anal lesions. Patient returns over concern of new mass on his tailbone. 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 I saw him for about a year and then he was lost to follow-up. Patient has backed off but not completely quit smoking. Patient claims his HIV numbers are good. Mallie Mussel felt a nodule a week or 2 ago in between his cheeks. Somewhat firm. Is not really drained. Optically painful. He still likes to use lidocaine/hydrocortisone for some occasional perianal itching and discomfort. The lesion does itch. Not gone away with that therapy. Wished to be seen. Normally has a bowel movement every day. No fevers or chills. No nausea or vomiting. Other Problems Elbert Ewings, CMA; 07/07/2015 12:03 PM) Asthma Back Pain Gastroesophageal Reflux Disease HIV-positive  Past Surgical History Elbert Ewings, CMA; 07/07/2015 12:03 PM) Oral Surgery Shoulder Surgery Right.  Diagnostic Studies History Elbert Ewings, [place]; 07/07/2015 12:03 PM) Colonoscopy 1-5 years ago  Allergies Elbert Ewings, CMA; 07/07/2015 12:03 PM) No Known Drug Allergies 07/07/2015  Medication History Elbert Ewings, CMA; 07/07/2015 12:05 PM) Complera (200-25-300MG Tablet, Oral) Active. TraMADol HCl (50MG Tablet, Oral) Active. Cyclobenzaprine HCl (10MG Tablet, Oral) Active. Diclofenac Sodium (1% Gel, Transdermal) Active. Diltiazem HCl (60MG Tablet, Oral) Active. Tivicay (50MG Tablet, Oral) Active. Voltaren (1% Gel, Transdermal) Active. Ibuprofen (400MG Tablet, Oral) Active. Ranitidine HCl (150MG Capsule, Oral)  Active. Medications Reconciled  Social History Elbert Ewings, [place]; 07/07/2015 12:03 PM) Alcohol use Occasional alcohol use. Caffeine use Carbonated beverages. Illicit drug use Uses socially only. Tobacco use Current every day smoker.  Family History Elbert Ewings, [place]; 07/07/2015 12:03 PM) Arthritis Father, Mother.     Review of Systems Elbert Ewings CMA; 07/07/2015 12:03 PM) General Present- Night Sweats. Not Present- Appetite Loss, Chills, Fatigue, Fever, Weight Gain and Weight Loss. Skin Present- Change in Wart/Mole. Not Present- Dryness, Hives, Jaundice, New Lesions, Non-Healing Wounds, Rash and Ulcer. HEENT Not Present- Earache, Hearing Loss, Hoarseness, Nose Bleed, Oral Ulcers, Ringing in the Ears, Seasonal Allergies, Sinus Pain, Sore Throat, Visual Disturbances, Wears glasses/contact lenses and Yellow Eyes. Respiratory Present- Difficulty Breathing and Snoring. Not Present- Bloody sputum, Chronic Cough and Wheezing. Breast Not Present- Breast Mass, Breast Pain, Nipple Discharge and Skin Changes. Cardiovascular Present- Difficulty Breathing Lying Down. Not Present- Chest Pain, Leg Cramps, Palpitations, Rapid Heart Rate, Shortness of Breath and Swelling of Extremities. Gastrointestinal Present- Abdominal Pain, Change in Bowel Habits, Constipation and Difficulty Swallowing. Not Present- Bloating, Bloody Stool, Chronic diarrhea, Excessive gas, Gets full quickly at meals, Hemorrhoids, Indigestion, Nausea, Rectal Pain and Vomiting. Male Genitourinary Present- Nocturia. Not Present- Blood in Urine, Change in Urinary Stream, Frequency, Impotence, Painful Urination, Urgency and Urine Leakage. Musculoskeletal Present- Back Pain. Not Present- Joint Pain, Joint Stiffness, Muscle Pain, Muscle Weakness and Swelling of Extremities. Neurological Present- Trouble walking. Not Present- Decreased Memory, Fainting, Headaches, Numbness, Seizures, Tingling, Tremor and Weakness. Psychiatric Not Present-  Anxiety, Bipolar, Change in Sleep Pattern, Depression, Fearful and Frequent crying. Endocrine Not Present- Cold Intolerance, Excessive Hunger, Hair Changes, Heat Intolerance, Hot flashes and New Diabetes. Hematology Present- HIV. Not Present- Easy Bruising, Excessive bleeding, Gland problems and Persistent Infections.  Vitals (  Elbert Ewings CMA; 07/07/2015 12:05 PM) 07/07/2015 12:05 PM Weight: 246 lb Height: 73.5in Body Surface Area: 2.41 m Body Mass Index: 32.02 kg/m Temp.: 97.11F(Temporal)  Pulse: 60 (Regular)  BP: 130/70 (Sitting, Left Arm, Standard)     Physical Exam Adin Hector MD; 07/07/2015 12:32 PM)  General Mental Status-Alert. General Appearance-Not in acute distress. Voice-Normal.  Integumentary Global Assessment Upon inspection and palpation of skin surfaces of the - Distribution of scalp and body hair is normal. General Characteristics Overall examination of the patient's skin reveals - no rashes and no suspicious lesions.  Head and Neck Head-normocephalic, atraumatic with no lesions or palpable masses. Face Global Assessment - atraumatic, no absence of expression. Neck Global Assessment - no abnormal movements, no decreased range of motion. Trachea-midline. Thyroid Gland Characteristics - non-tender.  Eye Eyeball - Left-Extraocular movements intact, No Nystagmus. Eyeball - Right-Extraocular movements intact, No Nystagmus. Upper Eyelid - Left-No Cyanotic. Upper Eyelid - Right-No Cyanotic.  Chest and Lung Exam Inspection Accessory muscles - No use of accessory muscles in breathing.  Abdomen Note: Soft nontender nondistended.   Male Genitourinary Note: Normal external male genitalia. No obvious source or lesions. No scrotal masses. No mass on midline raphae   Rectal Note: Perianal skin clear. No external warts. Normal sphincter tone. No fissure. No fistula. Normal sphincter tone. No masses or abnormalities or  condyloma on digital exam.  In the right mid intergluteal cleft there is a 10 x 8 mm firm subcutaneous mass. Some skin white and red blotches and some ?ulceration. Not painful. Firm. No definite fluctuance or expression of purulence. 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   Peripheral Vascular Upper Extremity Inspection - Left - Not Gangrenous, No Petechiae. Right - Not Gangrenous, No Petechiae.  Neurologic Neurologic evaluation reveals -normal attention span and ability to concentrate, able to name objects and repeat phrases. Appropriate fund of knowledge and normal coordination.  Neuropsychiatric Mental status exam performed with findings of-able to articulate well with normal speech/language, rate, volume and coherence and no evidence of hallucinations, delusions, obsessions or homicidal/suicidal ideation. Orientation-oriented X3.  Musculoskeletal Global Assessment Gait and Station - normal gait and station.  Lymphatic General Lymphatics Description - No Generalized lymphadenopathy.    Assessment & Plan Adin Hector MD; 07/07/2015 12:32 PM)  SACRAL MASS (R22.2) Impression: Mass intergluteal cleft with skin changes breakdown. Possible abscess. We will concerning for recurrent squamous cell carcinoma in situ given prior history.  We can do a trial of antibiotics to see things will calm down. I'm somewhat skeptical that that we'll work but reasonable to try.  More likely would recommend wide excision in the operating room to get negative margins to rule out cancer recurrence.  Try and get him to quit smoking.  Reasonable to renew topical lidocaine/hydrocortisone since that helped with some intermittent itching in the past. Noted that we'll not make it go away though.  Current Plans Started Lidocaine-Hydrocortisone Ace 3-2.5%, 1 (one) Kit two times daily, as needed, 1 Kit, 07/07/2015, Ref. x5. You have a  mass on your tailbone concerning for possible recurrent of squamous cell carcinoma in situ such was excised October 2013.  Because it possibly could be an infection, reasonable to do a trial of antibiotics for 5 days. If that does not work, schedule surgery to remove the mass.  You are being scheduled for surgery - Our schedulers will call you.  You should hear from our office's scheduling department within 5 working  days about the location, date, and time of surgery. We try to make accommodations for patient's preferences in scheduling surgery, but sometimes the OR schedule or the surgeon's schedule prevents [place] from making those accommodations.  If you have not heard from our office [phone]) in 5 working days, call the office and ask for your surgeon's nurse.  If you have other questions about your diagnosis, plan, or surgery, call the office and ask for your surgeon's nurse. Pt Education - [facility] STOP SMOKING! Pt Education - [facility] - General recommendations Pt Education - [facility] General Post-op HCI Started Augmentin 875-125MG, 1 (one) Tablet two times daily, #10, 07/07/2015, Ref. x1. The anatomy of the intragluteal cleft was discussed. Pathophysiology of pilonidal disease was discussed. The importance of keeping hairs trimmed to avoid recurrence was discussed. Discussion of options such as curretage, excision with closure vs leaving open was discussed. Risks of infection with need for incision and drainage & antibiotics were discussed. I noted a good likelihood this will help address the problem.  At this point, I think the patient would best served with considering surgery to excise the diseased tissue. I will make an attempt to close but it may need to be left open to allow it to heal with secondary intention and wound packing. Possible recurrences need reoperation or different techniques were discussed as well. I noted that recurrence is higher with poor compliance on hair removal hygiene &  overall health. The patient's questions were answered. The patient agrees to proceed.   The anatomy & physiology of the anorectal region was discussed. The pathophysiology of anorectal warts and differential diagnosis was discussed. Natural history risks without surgery was discussed such as further growth and cancer. I stressed the importance of office follow-up to catch early recurrence & minimize/halt progression of disease. Interventions such as cauterization or cryotherapy by topical agents were discussed.  The patient's symptoms are not adequately controlled by non-operative treatments. I feel the risks & problems of no surgery outweigh the operative risks; therefore, I recommended surgery to treat the anal warts by removal, ablation and/or cauterization.  Risks such as bleeding, infection, need for further treatment, heart attack, death, and other risks were discussed. I noted a good likelihood this will help address the problem. Goals of post-operative recovery were discussed as well. Possibility that this will not correct all symptoms was explained. Post-operative pain, bleeding, constipation, and other problems after surgery were discussed. We will work to minimize complications. Educational handouts further explaining the pathology, treatment options, and bowel regimen were given as well. Questions were answered. The patient expresses understanding & wishes to proceed with surgery.  Adin Hector, M.D., F.A.C.S. Gastrointestinal and Minimally Invasive Surgery [facility] [address], [place], [place] [address] [phone] Main / Paging

## 2015-07-08 NOTE — Progress Notes (Signed)
Thanks

## 2015-08-04 ENCOUNTER — Other Ambulatory Visit: Payer: Self-pay | Admitting: Surgery

## 2015-08-05 ENCOUNTER — Telehealth: Payer: Self-pay | Admitting: Gastroenterology

## 2015-08-05 NOTE — Telephone Encounter (Signed)
Patient feels the botox injection is wearing off. He requests to establish with Dr Silverio Decamp. Appointment made for 09/05/15.

## 2015-08-19 ENCOUNTER — Encounter: Payer: Self-pay | Admitting: Family Medicine

## 2015-09-05 ENCOUNTER — Encounter: Payer: Self-pay | Admitting: Gastroenterology

## 2015-09-05 ENCOUNTER — Ambulatory Visit (INDEPENDENT_AMBULATORY_CARE_PROVIDER_SITE_OTHER): Payer: Medicaid Other | Admitting: Gastroenterology

## 2015-09-05 VITALS — BP 112/60 | HR 94 | Ht 73.5 in | Wt 250.0 lb

## 2015-09-05 DIAGNOSIS — K220 Achalasia of cardia: Secondary | ICD-10-CM | POA: Diagnosis not present

## 2015-09-05 DIAGNOSIS — R1310 Dysphagia, unspecified: Secondary | ICD-10-CM | POA: Diagnosis not present

## 2015-09-05 NOTE — Progress Notes (Signed)
Benjamin Herrera    TG:[id number]    [birth year]  Primary Care Physician:FUNCHES, Lennox Laity, MD  Referring Physician: Boykin Nearing, MD [address] [place], [place] [address]  Chief complaint:  Dysphagia   HPI: 49 year old male with history of HIV on haart here with complaints of solid and liquid dysphagia associated with regurgitation. Patient was seen in by Dr. Deatra Ina in the past. He has had multiple Botox injections with transient improvement in symptoms, last Botox injection was in August 2016. He was on trazodone in the past with no significant improvement and has also tried diltiazem which was helping initially but has been off since August. He takes Flexeril as needed and was on oxycodone until 2 weeks ago. No weight loss, he has gained about 10 pounds in the past 3-4 months.   Outpatient Encounter Prescriptions as of 09/05/2015  Medication Sig  . COMPLERA 200-25-300 MG TABS TAKE 1 TABLET BY MOUTH DAILY.  . cyclobenzaprine (FLEXERIL) 10 MG tablet Take 1 tablet (10 mg total) by mouth at bedtime as needed for muscle spasms.  Marland Kitchen diltiazem (CARDIZEM) 60 MG tablet Take 1 tablet (60 mg total) by mouth 3 (three) times daily.  . dolutegravir (TIVICAY) 50 MG tablet Take 1 tablet (50 mg total) by mouth daily.  Marland Kitchen ibuprofen (ADVIL,MOTRIN) 400 MG tablet Take 1 tablet (400 mg total) by mouth every 6 (six) hours as needed.  . Naphazoline-Pheniramine (OPCON-A OP) Apply 1-2 drops to eye 2 (two) times daily as needed (dry eyes.).   Marland Kitchen traMADol (ULTRAM) 50 MG tablet Take 1 tablet (50 mg total) by mouth every 8 (eight) hours as needed. (Patient taking differently: Take 50 mg by mouth every 8 (eight) hours as needed for moderate pain. )  . [DISCONTINUED] diclofenac sodium (VOLTAREN) 1 % GEL Apply 2 g topically 2 (two) times daily.  . [DISCONTINUED] ranitidine (ZANTAC) 150 MG capsule Take 2 capsules (300 mg total) by mouth daily. (Patient taking differently: Take 300 mg by mouth daily as needed  for heartburn. )   No facility-administered encounter medications on file as of 09/05/2015.    Allergies as of 09/05/2015  . (No Known Allergies)    Past Medical History  Diagnosis Date  . Polysubstance abuse   . Boils     under arms hx of  . Hemorrhoids   . Herpes labialis   . Psoriasis     hx of  . Achalasia and cardiospasm   . Candidiasis of unspecified site   . Dysphagia   . Asthma   . Candidiasis of mouth   . Fracture, humerus   . Hepatitis 1993    A  . Pilonidal disease 01/10/2012  . Condyloma acuminatum in male of scrotum & anal canal s/p laser ablation 07/03/2012  . 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 HIV (human immunodeficiency virus infection) ([place]) [place]  . Hypertension Dx 2014    off all bp meds for last 9 or 10 months  . 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 Sleep apnea     does not  have CPAP    Past Surgical History  Procedure Laterality Date  . Humeral fracture surgery Right yrs ago  . Right shoulder replacement  2008    x 2  . Esophagogastroduodenoscopy  11/11/2011    Procedure: ESOPHAGOGASTRODUODENOSCOPY (EGD);  Surgeon: Inda Castle, MD;  Location:  [facility];  Service: Endoscopy;  Laterality: N/A;  botox injection  called Pt to change time of procedure per Dr Deatra Ina  . Esophagogastroduodenoscopy  03/10/2012    Procedure: ESOPHAGOGASTRODUODENOSCOPY (EGD);  Surgeon: Inda Castle, MD;  Location: [facility];  Service: Endoscopy;  Laterality: N/A;  . Colonoscopy  05/19/2012    Procedure: COLONOSCOPY;  Surgeon: Inda Castle, MD;  Location: [facility];  Service: Endoscopy;  Laterality: N/A;  jill trying to contact pt to come in 0830 for 930 case, phone not accepting messages  . Esophageal manometry  05/29/2012    Procedure: ESOPHAGEAL MANOMETRY (EM);  Surgeon: Inda Castle, MD;  Location: [facility];  Service: Endoscopy;  Laterality: N/A;  . Pilonidal  cyst excision  07/24/2012    Procedure: CYST EXCISION PILONIDAL SIMPLE;  Surgeon: Adin Hector, MD;  Location: [facility];  Service: General;  Laterality: N/A;  Exam Under Anesthesia,, Excision Pilonidal Disease,   . Examination under anesthesia  07/24/2012    Procedure: EXAM UNDER ANESTHESIA;  Surgeon: Adin Hector, MD;  Location: [facility];  Service: General;  Laterality: N/A;  . Wart fulguration  07/24/2012    Procedure: FULGURATION ANAL WART;  Surgeon: Adin Hector, MD;  Location: [facility];  Service: General;  Laterality: N/A;  excision of raphe mass  . Esophagogastroduodenoscopy  10/11/2012    Procedure: ESOPHAGOGASTRODUODENOSCOPY (EGD);  Surgeon: Inda Castle, MD;  Location: [facility];  Service: Endoscopy;  Laterality: N/A;  . Botox injection  10/11/2012    Procedure: BOTOX INJECTION;  Surgeon: Inda Castle, MD;  Location: [facility];  Service: Endoscopy;  Laterality: N/A;  . Esophagogastroduodenoscopy N/A 01/25/2013    Procedure: ESOPHAGOGASTRODUODENOSCOPY (EGD);  Surgeon: Inda Castle, MD;  Location: [facility];  Service: Endoscopy;  Laterality: N/A;  . Botox injection N/A 01/25/2013    Procedure: BOTOX INJECTION;  Surgeon: Inda Castle, MD;  Location: [facility];  Service: Endoscopy;  Laterality: N/A;  . Esophagogastroduodenoscopy N/A 06/21/2013    Procedure: ESOPHAGOGASTRODUODENOSCOPY (EGD);  Surgeon: Inda Castle, MD;  Location: [facility];  Service: Endoscopy;  Laterality: N/A;  . Botox injection N/A 06/21/2013    Procedure: BOTOX INJECTION;  Surgeon: Inda Castle, MD;  Location: [facility];  Service: Endoscopy;  Laterality: N/A;  . Esophagogastroduodenoscopy (egd) with propofol N/A 10/08/2013    Procedure: ESOPHAGOGASTRODUODENOSCOPY (EGD) WITH PROPOFOL;  Surgeon: Inda Castle, MD;  Location: [facility];  Service: Endoscopy;  Laterality: N/A;  . Botox injection N/A 10/08/2013    Procedure: BOTOX INJECTION;  Surgeon: Inda Castle, MD;  Location: [facility];  Service: Endoscopy;  Laterality: N/A;  . Esophagogastroduodenoscopy (egd) with propofol N/A 06/16/2015    Procedure: ESOPHAGOGASTRODUODENOSCOPY (EGD) WITH PROPOFOL;  Surgeon: Inda Castle, MD;  Location: [facility];  Service: Endoscopy;  Laterality: N/A;  . Botox injection N/A 06/16/2015    Procedure: BOTOX INJECTION;  Surgeon: Inda Castle, MD;  Location: [facility];  Service: Endoscopy;  Laterality: N/A;    Family History  Problem Relation Age of Onset  . Colon cancer Neg Hx   . Stomach cancer Maternal Grandmother     ?  . Diabetes Maternal Grandmother   . Arthritis Mother   . Hypertension Mother   . Heart disease Mother     Social History   Social History  . Marital Status: Single    Spouse Name: N/A  . Number of Children: N/A  . Years of Education: N/A   Occupational History  .  Not on file.   Social History Main Topics  . Smoking status: Current Every Day Smoker -- 0.50 packs/day for 25 years    Types: Cigarettes  . Smokeless tobacco: Never Used     Comment: not ready  . Alcohol Use: No  . Drug Use: 7.00 per week    Special: Marijuana     Comment: 04-15-15 pt now denies cocaine use , last cocaine use 4 yrs ago, occasional marijuana use-"none used in a long time."  . Sexual Activity:    Partners: Male     Comment: pt. given condoms   Other Topics Concern  . Not on file   Social History Narrative      Review of systems: Review of Systems  Constitutional: Negative for fever and chills.  HENT: Negative.   Eyes: Negative for blurred vision.  Respiratory: Negative for cough, shortness of breath and wheezing.   Cardiovascular: Negative for chest pain and palpitations.  Gastrointestinal: as per HPI Genitourinary: Negative for dysuria, urgency, frequency and hematuria.  Musculoskeletal: Negative for myalgias, back pain and joint pain.  Skin: Negative for itching and rash.  Neurological: Negative for dizziness, tremors, focal weakness,  seizures and loss of consciousness.  Endo/Heme/Allergies: Negative for environmental allergies.  Psychiatric/Behavioral: Negative for depression, suicidal ideas and hallucinations.  All other systems reviewed and are negative.   Physical Exam: Filed Vitals:   09/05/15 1358  BP: 112/60  Pulse: 94   Gen:      No acute distress HEENT:  EOMI, sclera anicteric Neck:     No masses; no thyromegaly Lungs:    Clear to auscultation bilaterally; normal respiratory effort CV:         Regular rate and rhythm; no murmurs Abd:      + bowel sounds; soft, non-tender; no palpable masses, no distension Ext:    No edema; adequate peripheral perfusion Skin:      Warm and dry; no rash Neuro: alert and oriented x 3 Psych: normal mood and affect  Data Reviewed: Barium esophagogram 2010  There is decreased esophageal peristalsis with tapering due to contracted lower esophageal sphincter. Findings are consistent with early achalasia. No gastroesophageal reflux. No organic esophageal stricture. No hiatal hernia. No mucosal lesion is Noted.  Esophagogram 2015 1. Findings, as above, compatible with spasm of the lower esophageal sphincter, likely with coexistent mild stricture. The possibility of underlying neoplasm in this region is not excluded, but is not strongly favored based on the images obtained. Correlation with endoscopy is strongly recommended for further evaluation. 2. Severe tertiary contractions. 3. Small hiatal hernia.   Assessment and Plan/Recommendations:  49 year old male with HIV on haart with complaints of solid liquid dysphagia findings suggestive of distal esophageal spasm on esophageal manometry done at [facility], and barium swallow in 2010 was concerning for achalasia.  Given the conflicting studies, I would like to repeat esophageal manometry here Would not recommend repeated Botox injections given only transient relief and potential risks Advised patient to avoid  muscle relaxants and also high-dose narcotics as they can impair relaxation of the lower esophageal sphincter Further recommendations and management based on esophageal manometry study  Damaris Hippo , MD [phone] Mon-Fri 8a-5p [phone] after 5p, weekends, holidays

## 2015-09-05 NOTE — Patient Instructions (Addendum)
You have been scheduled for an esophageal manometry at [facility] on 10/06/2015 at 12:30pm. Please arrive 30 minutes prior to your procedure for registration. You will need to go to outpatient registration (1st floor of the hospital) first. Make certain to bring your insurance cards as well as a complete list of medications.  Please remember the following:  1) Nothing to eat or drink after 12:00 midnight on the night before your test.  2) Hold all diabetic medications/insulin the morning of the test. You may eat and take             your medications after the test.  3) For 3 days prior to your test do not take: Dexilant, Prevacid, Nexium, Protonix,         Aciphex, Zegerid, Pantoprazole, Prilosec or omeprazole.  4) For 2 days prior to your test, do not take: Reglan, Tagamet, Zantac, Axid or Pepcid.  5) You MAY use an antacid such as Rolaids or Tums up to 12 hours prior to your test.  It will take at least 2 weeks to receive the results of this test from your physician. ------------------------------------------ ABOUT ESOPHAGEAL MANOMETRY Esophageal manometry (muh-NOM-uh-tree) is a test that gauges how well your esophagus works. Your esophagus is the long, muscular tube that connects your throat to your stomach. Esophageal manometry measures the rhythmic muscle contractions (peristalsis) that occur in your esophagus when you swallow. Esophageal manometry also measures the coordination and force exerted by the muscles of your esophagus.  During esophageal manometry, a thin, flexible tube (catheter) that contains sensors is passed through your nose, down your esophagus and into your stomach. Esophageal manometry can be helpful in diagnosing some mostly uncommon disorders that affect your esophagus.  Why it's done Esophageal manometry is used to evaluate the movement (motility) of food through the esophagus and into the stomach. The test measures how well the circular bands of muscle  (sphincters) at the top and bottom of your esophagus open and close, as well as the pressure, strength and pattern of the wave of esophageal muscle contractions that moves food along.  What you can expect Esophageal manometry is an outpatient procedure done without sedation. Most people tolerate it well. You may be asked to change into a hospital gown before the test starts.  During esophageal manometry  While you are sitting up, a member of your health care team sprays your throat with a numbing medication or puts numbing gel in your nose or both.  A catheter is guided through your nose into your esophagus. The catheter may be sheathed in a water-filled sleeve. It doesn't interfere with your breathing. However, your eyes may water, and you may gag. You may have a slight nosebleed from irritation.  After the catheter is in place, you may be asked to lie on your back on an exam table, or you may be asked to remain seated.  You then swallow small sips of water. As you do, a computer connected to the catheter records the pressure, strength and pattern of your esophageal muscle contractions.  During the test, you'll be asked to breathe slowly and smoothly, remain as still as possible, and swallow only when you're asked to do so.  A member of your health care team may move the catheter down into your stomach while the catheter continues its measurements.  The catheter then is slowly withdrawn. The test usually lasts 20 to 30 minutes.  After esophageal manometry  When your esophageal manometry is complete, you  may return to your normal activities  This test typically takes 30-45 minutes to complete. ________________________________________________________________________________ We will call you with follow up appointment after results

## 2015-09-09 ENCOUNTER — Other Ambulatory Visit: Payer: Self-pay | Admitting: Surgery

## 2015-09-24 ENCOUNTER — Other Ambulatory Visit: Payer: Medicaid Other

## 2015-09-25 ENCOUNTER — Emergency Department (HOSPITAL_COMMUNITY)
Admission: EM | Admit: 2015-09-25 | Discharge: 2015-09-25 | Disposition: A | Discharge status: Home / Self Care | Payer: Medicaid Other | Attending: Emergency Medicine | Admitting: Emergency Medicine

## 2015-09-25 ENCOUNTER — Encounter (HOSPITAL_COMMUNITY): Payer: Self-pay

## 2015-09-25 ENCOUNTER — Emergency Department (HOSPITAL_COMMUNITY): Payer: Medicaid Other

## 2015-09-25 DIAGNOSIS — Z8669 Personal history of other diseases of the nervous system and sense organs: Secondary | ICD-10-CM | POA: Insufficient documentation

## 2015-09-25 DIAGNOSIS — R1033 Periumbilical pain: Secondary | ICD-10-CM | POA: Insufficient documentation

## 2015-09-25 DIAGNOSIS — F17210 Nicotine dependence, cigarettes, uncomplicated: Secondary | ICD-10-CM | POA: Insufficient documentation

## 2015-09-25 DIAGNOSIS — R1013 Epigastric pain: Secondary | ICD-10-CM | POA: Diagnosis not present

## 2015-09-25 DIAGNOSIS — Z85828 Personal history of other malignant neoplasm of skin: Secondary | ICD-10-CM | POA: Insufficient documentation

## 2015-09-25 DIAGNOSIS — B20 Human immunodeficiency virus [HIV] disease: Secondary | ICD-10-CM | POA: Diagnosis not present

## 2015-09-25 DIAGNOSIS — G8929 Other chronic pain: Secondary | ICD-10-CM | POA: Diagnosis not present

## 2015-09-25 DIAGNOSIS — Z8781 Personal history of (healed) traumatic fracture: Secondary | ICD-10-CM | POA: Insufficient documentation

## 2015-09-25 DIAGNOSIS — I10 Essential (primary) hypertension: Secondary | ICD-10-CM | POA: Diagnosis not present

## 2015-09-25 DIAGNOSIS — Z872 Personal history of diseases of the skin and subcutaneous tissue: Secondary | ICD-10-CM | POA: Insufficient documentation

## 2015-09-25 DIAGNOSIS — Z8719 Personal history of other diseases of the digestive system: Secondary | ICD-10-CM | POA: Diagnosis not present

## 2015-09-25 DIAGNOSIS — J45909 Unspecified asthma, uncomplicated: Secondary | ICD-10-CM | POA: Insufficient documentation

## 2015-09-25 DIAGNOSIS — Z79899 Other long term (current) drug therapy: Secondary | ICD-10-CM | POA: Insufficient documentation

## 2015-09-25 DIAGNOSIS — R110 Nausea: Secondary | ICD-10-CM | POA: Insufficient documentation

## 2015-09-25 LAB — COMPREHENSIVE METABOLIC PANEL
ALBUMIN: 3.9 g/dL (ref 3.5–5.0)
ALK PHOS: 93 U/L (ref 38–126)
ALT: 18 U/L (ref 17–63)
ANION GAP: 7 (ref 5–15)
AST: 20 U/L (ref 15–41)
BUN: 12 mg/dL (ref 6–20)
CHLORIDE: 106 mmol/L (ref 101–111)
CO2: 24 mmol/L (ref 22–32)
Calcium: 9.1 mg/dL (ref 8.9–10.3)
Creatinine, Ser: 1.21 mg/dL (ref 0.61–1.24)
GFR calc non Af Amer: >60 mL/min (ref >60)
GLUCOSE: 107 mg/dL — AB (ref 65–99)
Potassium: 3.9 mmol/L (ref 3.5–5.1)
SODIUM: 137 mmol/L (ref 135–145)
Total Bilirubin: 0.5 mg/dL (ref 0.3–1.2)
Total Protein: 7.5 g/dL (ref 6.5–8.1)

## 2015-09-25 LAB — URINALYSIS, ROUTINE W REFLEX MICROSCOPIC
BILIRUBIN URINE: NEGATIVE
GLUCOSE, UA: NEGATIVE mg/dL
Ketones, ur: NEGATIVE mg/dL
Leukocytes, UA: NEGATIVE
Nitrite: NEGATIVE
PH: 5.5 (ref 5.0–8.0)
Protein, ur: NEGATIVE mg/dL
SPECIFIC GRAVITY, URINE: 1.021 (ref 1.005–1.030)

## 2015-09-25 LAB — CBC
HEMATOCRIT: 44.1 % (ref 39.0–52.0)
HEMOGLOBIN: 14.8 g/dL (ref 13.0–17.0)
MCH: 26 pg (ref 26.0–34.0)
MCHC: 33.6 g/dL (ref 30.0–36.0)
MCV: 77.5 fL — AB (ref 78.0–100.0)
Platelets: 196 10*3/uL (ref 150–400)
RBC: 5.69 MIL/uL (ref 4.22–5.81)
RDW: 15.3 % (ref 11.5–15.5)
WBC: 5.3 10*3/uL (ref 4.0–10.5)

## 2015-09-25 LAB — URINE MICROSCOPIC-ADD ON

## 2015-09-25 LAB — LIPASE, BLOOD: LIPASE: 77 U/L — AB (ref 11–51)

## 2015-09-25 MED ORDER — IOHEXOL 300 MG/ML  SOLN
100.0000 mL | Freq: Once | INTRAMUSCULAR | Status: AC | PRN
  Administered 2015-09-25: 100 mL via INTRAVENOUS

## 2015-09-25 MED ORDER — RANITIDINE HCL 150 MG PO TABS: 150.0000 mg | ORAL_TABLET | Freq: Two times a day (BID) | ORAL | Status: DC

## 2015-09-25 MED ORDER — GI COCKTAIL ~~LOC~~
30.0000 mL | Freq: Once | ORAL | Status: AC
  Administered 2015-09-25: 30 mL via ORAL
  Filled 2015-09-25: qty 30

## 2015-09-25 MED ORDER — HYDROCODONE-ACETAMINOPHEN 5-325 MG PO TABS: 1.0000 | ORAL_TABLET | ORAL | Status: DC | PRN

## 2015-09-25 MED ORDER — ONDANSETRON HCL 4 MG/2ML IJ SOLN
4.0000 mg | Freq: Once | INTRAMUSCULAR | Status: AC | PRN
  Administered 2015-09-25: 4 mg via INTRAVENOUS
  Filled 2015-09-25: qty 2

## 2015-09-25 MED ORDER — OXYCODONE-ACETAMINOPHEN 5-325 MG PO TABS
1.0000 | ORAL_TABLET | Freq: Once | ORAL | Status: AC
  Administered 2015-09-25: 1 via ORAL
  Filled 2015-09-25: qty 1

## 2015-09-25 MED ORDER — IOHEXOL 300 MG/ML  SOLN
25.0000 mL | Freq: Once | INTRAMUSCULAR | Status: AC | PRN
  Administered 2015-09-25: 25 mL via ORAL

## 2015-09-25 NOTE — ED Notes (Signed)
Patient c/o mid abdominal pain and nausea x 1 1/2 hours ago. Patient states he had same type of pain 2-3 months ago, but was not told what was wrong.

## 2015-09-25 NOTE — ED Notes (Signed)
PA at bedside.

## 2015-09-25 NOTE — ED Provider Notes (Signed)
CSN: [id number]     Arrival date & time 09/25/15  1238 History   First MD Initiated Contact with Patient 09/25/15 1559     Chief Complaint  Patient presents with  . Abdominal Pain  . Nausea    HPI   [name] is an 49 y.o. male with history of HIV (on HAART), achalasia, dysphagia, asthma, HTN, OSA who presents to the ED for evaluation of abdominal pain. He reports the pain started this morning around 11AM. He describes the pain as a sharp pain that starts in his periumbilical area and travels up to his epigastric area. He states the pain is associated with nausea but no emesis. He states that the pain is constant and it is not associated with PO intake. Denies fever, diarrhea, chest pain, SOB. Denies BRBPR, black tarry stools, or urinary symptoms. Pt states he has getting these sharp severe pains every other month for the past year. Pt does follow with GI (previously followed with Dr. Deatra Ina until his retirement) for achalasia/dysphagia but he states they have not given him any answers about his recurrent abdominal pain. Last CT abd/pelv in 01/2015 was negative. Review of EMR reveals that Dr. Deatra Ina had suggested possible biliary dyskinesia and pt was to be scheduled for HIDA scan. Pt is unaware of this plan. Pt was given percocet and zofran in triage and reports some improvement in his pain. Endorses smoking 1/2-1 PPD. Denies EtOH or other drug use.   Past Medical History  Diagnosis Date  . Polysubstance abuse   . Boils     under arms hx of  . Hemorrhoids   . Herpes labialis   . Psoriasis     hx of  . Achalasia and cardiospasm   . Candidiasis of unspecified site   . Dysphagia   . Asthma   . Candidiasis of mouth   . Fracture, humerus   . Hepatitis 1993    A  . Pilonidal disease 01/10/2012  . Condyloma acuminatum in male of scrotum & anal canal s/p laser ablation 07/03/2012  . 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 HIV (human immunodeficiency virus  infection) ([place]) [place]  . Hypertension Dx 2014    off all bp meds for last 9 or 10 months  . 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 Sleep apnea     does not  have CPAP   Past Surgical History  Procedure Laterality Date  . Humeral fracture surgery Right yrs ago  . Right shoulder replacement  2008    x 2  . Esophagogastroduodenoscopy  11/11/2011    Procedure: ESOPHAGOGASTRODUODENOSCOPY (EGD);  Surgeon: Inda Castle, MD;  Location: [facility];  Service: Endoscopy;  Laterality: N/A;  botox injection  called Pt to change time of procedure per Dr Deatra Ina  . Esophagogastroduodenoscopy  03/10/2012    Procedure: ESOPHAGOGASTRODUODENOSCOPY (EGD);  Surgeon: Inda Castle, MD;  Location: [facility];  Service: Endoscopy;  Laterality: N/A;  . Colonoscopy  05/19/2012    Procedure: COLONOSCOPY;  Surgeon: Inda Castle, MD;  Location: [facility];  Service: Endoscopy;  Laterality: N/A;  jill trying to contact pt to come in 0830 for 930 case, phone not accepting messages  . Esophageal manometry  05/29/2012    Procedure: ESOPHAGEAL MANOMETRY (EM);  Surgeon: Inda Castle, MD;  Location: [facility];  Service: Endoscopy;  Laterality: N/A;  . Pilonidal cyst excision  07/24/2012  Procedure: CYST EXCISION PILONIDAL SIMPLE;  Surgeon: Adin Hector, MD;  Location: [facility];  Service: General;  Laterality: N/A;  Exam Under Anesthesia,, Excision Pilonidal Disease,   . Examination under anesthesia  07/24/2012    Procedure: EXAM UNDER ANESTHESIA;  Surgeon: Adin Hector, MD;  Location: [facility];  Service: General;  Laterality: N/A;  . Wart fulguration  07/24/2012    Procedure: FULGURATION ANAL WART;  Surgeon: Adin Hector, MD;  Location: [facility];  Service: General;  Laterality: N/A;  excision of raphe mass  . Esophagogastroduodenoscopy  10/11/2012    Procedure: ESOPHAGOGASTRODUODENOSCOPY (EGD);  Surgeon: Inda Castle, MD;  Location: [facility];  Service:  Endoscopy;  Laterality: N/A;  . Botox injection  10/11/2012    Procedure: BOTOX INJECTION;  Surgeon: Inda Castle, MD;  Location: [facility];  Service: Endoscopy;  Laterality: N/A;  . Esophagogastroduodenoscopy N/A 01/25/2013    Procedure: ESOPHAGOGASTRODUODENOSCOPY (EGD);  Surgeon: Inda Castle, MD;  Location: [facility];  Service: Endoscopy;  Laterality: N/A;  . Botox injection N/A 01/25/2013    Procedure: BOTOX INJECTION;  Surgeon: Inda Castle, MD;  Location: [facility];  Service: Endoscopy;  Laterality: N/A;  . Esophagogastroduodenoscopy N/A 06/21/2013    Procedure: ESOPHAGOGASTRODUODENOSCOPY (EGD);  Surgeon: Inda Castle, MD;  Location: [facility];  Service: Endoscopy;  Laterality: N/A;  . Botox injection N/A 06/21/2013    Procedure: BOTOX INJECTION;  Surgeon: Inda Castle, MD;  Location: [facility];  Service: Endoscopy;  Laterality: N/A;  . Esophagogastroduodenoscopy (egd) with propofol N/A 10/08/2013    Procedure: ESOPHAGOGASTRODUODENOSCOPY (EGD) WITH PROPOFOL;  Surgeon: Inda Castle, MD;  Location: [facility];  Service: Endoscopy;  Laterality: N/A;  . Botox injection N/A 10/08/2013    Procedure: BOTOX INJECTION;  Surgeon: Inda Castle, MD;  Location: [facility];  Service: Endoscopy;  Laterality: N/A;  . Esophagogastroduodenoscopy (egd) with propofol N/A 06/16/2015    Procedure: ESOPHAGOGASTRODUODENOSCOPY (EGD) WITH PROPOFOL;  Surgeon: Inda Castle, MD;  Location: [facility];  Service: Endoscopy;  Laterality: N/A;  . Botox injection N/A 06/16/2015    Procedure: BOTOX INJECTION;  Surgeon: Inda Castle, MD;  Location: [facility];  Service: Endoscopy;  Laterality: N/A;   Family History  Problem Relation Age of Onset  . Colon cancer Neg Hx   . Stomach cancer Maternal Grandmother     ?  . Diabetes Maternal Grandmother   . Arthritis Mother   . Hypertension Mother   . Heart disease Mother    Social History  Substance Use Topics  . Smoking status:  Current Every Day Smoker -- 0.50 packs/day for 25 years    Types: Cigarettes  . Smokeless tobacco: Never Used     Comment: not ready  . Alcohol Use: No    Review of Systems  All other systems reviewed and are negative.     Allergies  Review of patient's allergies indicates no known allergies.  Home Medications   Prior to Admission medications   Medication Sig Start Date End Date Taking? Authorizing Provider  COMPLERA 200-25-300 MG TABS TAKE 1 TABLET BY MOUTH DAILY. 05/05/15  Yes Truman Hayward, MD  dolutegravir (TIVICAY) 50 MG tablet Take 1 tablet (50 mg total) by mouth daily. 05/05/15  Yes Truman Hayward, MD  Naphazoline-Pheniramine (OPCON-A OP) Apply 1-2 drops to eye 2 (two) times daily as needed (dry eyes.).    Yes Historical Provider, MD  traMADol (ULTRAM) 50 MG tablet Take 1 tablet (50 mg  total) by mouth every 8 (eight) hours as needed. Patient taking differently: Take 50 mg by mouth every 8 (eight) hours as needed for moderate pain.  03/17/15  Yes Josalyn Funches, MD  cyclobenzaprine (FLEXERIL) 10 MG tablet Take 1 tablet (10 mg total) by mouth at bedtime as needed for muscle spasms. Patient not taking: Reported on 09/25/2015 03/17/15   Boykin Nearing, MD  diltiazem (CARDIZEM) 60 MG tablet Take 1 tablet (60 mg total) by mouth 3 (three) times daily. Patient not taking: Reported on 09/25/2015 03/25/15   Amy S Esterwood, PA-C  ibuprofen (ADVIL,MOTRIN) 400 MG tablet Take 1 tablet (400 mg total) by mouth every 6 (six) hours as needed. Patient not taking: Reported on 09/25/2015 05/24/15   Comer Locket, PA-C   BP 138/75 mmHg  Pulse 72  Temp(Src) 98.5 F (36.9 C) (Oral)  Resp 17  Ht 6\' 1"  (1.854 m)  Wt 111.131 kg  BMI 32.33 kg/m2  SpO2 97% Physical Exam  Constitutional: He is oriented to person, place, and time. No distress.  HENT:  Right Ear: External ear normal.  Left Ear: External ear normal.  Nose: Nose normal.  Mouth/Throat: Oropharynx is clear and moist. No  oropharyngeal exudate.  Eyes: Conjunctivae and EOM are normal. Pupils are equal, round, and reactive to light.  Neck: Normal range of motion. Neck supple.  Cardiovascular: Normal rate, regular rhythm, normal heart sounds and intact distal pulses.   No murmur heard. Pulmonary/Chest: Effort normal and breath sounds normal. No respiratory distress. He has no wheezes. He exhibits no tenderness.  Abdominal: Soft. Bowel sounds are normal. He exhibits no distension and no mass. There is tenderness in the epigastric area and periumbilical area. There is no rigidity, no rebound, no guarding and no CVA tenderness.  Musculoskeletal: He exhibits no edema.  Neurological: He is alert and oriented to person, place, and time. No cranial nerve deficit.  Skin: Skin is warm and dry. He is not diaphoretic.  Psychiatric: He has a normal mood and affect.  Nursing note and vitals reviewed.   ED Course  Procedures (including critical care time) Labs Review Labs Reviewed  LIPASE, BLOOD - Abnormal; Notable for the following:    Lipase 77 (*)    All other components within normal limits  COMPREHENSIVE METABOLIC PANEL - Abnormal; Notable for the following:    Glucose, Bld 107 (*)    All other components within normal limits  CBC - Abnormal; Notable for the following:    MCV 77.5 (*)    All other components within normal limits  URINALYSIS, ROUTINE W REFLEX MICROSCOPIC (NOT AT [facility]) - Abnormal; Notable for the following:    Hgb urine dipstick TRACE (*)    All other components within normal limits  URINE MICROSCOPIC-ADD ON - Abnormal; Notable for the following:    Squamous Epithelial / LPF 0-5 (*)    Bacteria, UA RARE (*)    All other components within normal limits    Imaging Review Ct Abdomen Pelvis W Contrast  09/25/2015  CLINICAL DATA:  Mid abdominal pain and nausea the beginning 1-1/2 hours ago. Similar episode 2-3 months ago. Personal history of HIV. EXAM: CT ABDOMEN AND PELVIS WITH CONTRAST TECHNIQUE:  Multidetector CT imaging of the abdomen and pelvis was performed using the standard protocol following bolus administration of intravenous contrast. CONTRAST:  46mL OMNIPAQUE IOHEXOL 300 MG/ML SOLN, 111mL OMNIPAQUE IOHEXOL 300 MG/ML SOLN COMPARISON:  CT of the abdomen and pelvis 02/17/2015. Hepatobiliary scan 04/09/2015. FINDINGS: Lower chest: Mild dependent atelectasis  is present at the lung bases bilaterally. No focal nodule, mass, or airspace disease is present. The heart size is normal. No significant pericardial effusion is present. There is mild thickening of the distal esophagus, similar to the prior exam. Hepatobiliary: The liver is within normal limits. An 11 mm gallstone is present at the neck of the gallbladder. This was previously seen at the fundus of the gallbladder. The common bile duct is within normal limits. Pancreas: Negative Spleen: Within normal limits Adrenals/Urinary Tract: The adrenal glands are normal bilaterally a kidneys and ureters are within normal limits. The urinary bladder is not particularly distended. It is within normal limits. Stomach/Bowel: The stomach is within normal limits. The duodenum is unremarkable. The small bowel is within normal limits is well. Oral contrast can be seen to the level of the cecum. The terminal ileum is unremarkable. The appendix is visualized and normal. The rectosigmoid colon is within normal limits. The more proximal colon is unremarkable. Vascular/Lymphatic: Atherosclerotic calcifications are present throughout the aorta. There is no focal aneurysm or definite stenosis. No significant adenopathy is present. Reproductive: Unremarkable Other: No significant free fluid is present. A small amount of fat is herniated into the left inguinal canal without associated bowel Musculoskeletal: Bone windows demonstrate leftward curvature at L4-5. A bone island at T12 is stable. The pelvis is intact. IMPRESSION: 1. Stable cholelithiasis without evidence for  cholecystitis. 2. Aortoiliac atherosclerosis without aneurysm. 3. Chronic distal esophageal wall thickening. This may be related to reflux disease or esophagitis and could contribute to mid epigastric pain. Endoscopy may be views for further evaluation. 4. Minimal fat herniated into the left inguinal canal without associated bowel. 5. Mild scoliosis. 6. No acute abnormality to explain the patient's symptoms otherwise. Electronically Signed   By: San Morelle M.D.   On: 09/25/2015 17:35   I have personally reviewed and evaluated these images and lab results as part of my medical decision-making.   EKG Interpretation None      MDM   Final diagnoses:  Epigastric pain    Pt is a 49 y.o. male who presents for evaluation of chronic abdominal pain. States that his GI doctors have not given him a good answer about his pain but he thinks they have been more focused on his dysphagia/achalasia. He would like a CT scan today even though last CT in April was negative. His lipase is slightly elevated to 77 but his labs are otherwise at baseline. I discussed that since pt has regular GI follow-up and reassuring labs he does not necessarily need an emergent CT scan. However pt would feel more comfortable given his tenderness, nausea, and slightly elevated lipase. Will get CT abd/pelvis. Encouraged pt to ask GI about possible HIDA scan as well.   CT shows stable cholelithiasis. There is chronic distal esophageal thickening, which I suspect is related to pt's achalasia and dysphagia. It could also be related to reflux/esophagitis and might be contributing to pt's pain. I will start pt on Zantac as pt is not currently on any acid reduction therapy. Instructed to discuss today's visit with GI next week. ER return precautions given.  Pt requests short course of percocet. STates this is what he usually gets for abdominal pain. I told him I can give him a few vicodin. He is in agreement.   Anne Ng,  PA-C 09/26/15 [id number]  Lacretia Leigh, MD 09/26/15 [phone]

## 2015-09-25 NOTE — Discharge Instructions (Signed)
Your labs today were unremarkable. Your CT scan showed no acute abnormalities. Your symptoms might be related to your esophagus and/or acid reflux. Please follow-up with your GI doctor regarding your abdominal pain. Per your request, I will give you a short course of Vicodin for your pain. Return to the ER for new or worsening symptoms.    Please obtain all of your results from medical records or have your doctors office obtain the results - share them with your doctor - you should be seen at your doctors office in the next 2 days. Call today to arrange your follow up. Take the medications as prescribed. Please review all of the medicines and only take them if you do not have an allergy to them. Please be aware that if you are taking birth control pills, taking other prescriptions, ESPECIALLY ANTIBIOTICS may make the birth control ineffective - if this is the case, either do not engage in sexual activity or use alternative methods of birth control such as condoms until you have finished the medicine and your family doctor says it is OK to restart them. If you are on a blood thinner such as COUMADIN, be aware that any other medicine that you take may cause the coumadin to either work too much, or not enough - you should have your coumadin level rechecked in next 7 days if this is the case.  ?  It is also a possibility that you have an allergic reaction to any of the medicines that you have been prescribed - Everybody reacts differently to medications and while MOST people have no trouble with most medicines, you may have a reaction such as nausea, vomiting, rash, swelling, shortness of breath. If this is the case, please stop taking the medicine immediately and contact your physician.  ?  You should return to the ER if you develop severe or worsening symptoms.

## 2015-10-06 ENCOUNTER — Ambulatory Visit (HOSPITAL_COMMUNITY)
Admission: RE | Admit: 2015-10-06 | Discharge: 2015-10-06 | Disposition: A | Discharge status: Home / Self Care | Payer: Medicaid Other | Source: Ambulatory Visit | Attending: Gastroenterology | Admitting: Gastroenterology

## 2015-10-06 ENCOUNTER — Encounter (HOSPITAL_COMMUNITY)
Admission: RE | Disposition: A | Discharge status: Home / Self Care | Payer: Self-pay | Source: Ambulatory Visit | Attending: Gastroenterology

## 2015-10-06 DIAGNOSIS — R1310 Dysphagia, unspecified: Secondary | ICD-10-CM | POA: Insufficient documentation

## 2015-10-06 HISTORY — PX: ESOPHAGEAL MANOMETRY: SHX5429

## 2015-10-06 SURGERY — MANOMETRY, ESOPHAGUS

## 2015-10-06 MED ORDER — LIDOCAINE VISCOUS 2 % MT SOLN
OROMUCOSAL | Status: AC
  Filled 2015-10-06: qty 15

## 2015-10-06 SURGICAL SUPPLY — 2 items
FACESHIELD LNG OPTICON STERILE (SAFETY) IMPLANT
GLOVE BIO SURGEON STRL SZ8 (GLOVE) ×6 IMPLANT

## 2015-10-07 ENCOUNTER — Encounter (HOSPITAL_COMMUNITY): Payer: Self-pay | Admitting: Gastroenterology

## 2015-10-08 ENCOUNTER — Encounter: Payer: Self-pay | Admitting: Infectious Diseases

## 2015-10-08 ENCOUNTER — Ambulatory Visit (INDEPENDENT_AMBULATORY_CARE_PROVIDER_SITE_OTHER): Payer: Medicaid Other | Admitting: Infectious Diseases

## 2015-10-08 VITALS — BP 148/86 | HR 73 | Temp 98.2°F | Ht 73.5 in | Wt 251.8 lb

## 2015-10-08 DIAGNOSIS — M5126 Other intervertebral disc displacement, lumbar region: Secondary | ICD-10-CM | POA: Diagnosis not present

## 2015-10-08 DIAGNOSIS — A630 Anogenital (venereal) warts: Secondary | ICD-10-CM | POA: Diagnosis not present

## 2015-10-08 DIAGNOSIS — Z23 Encounter for immunization: Secondary | ICD-10-CM

## 2015-10-08 DIAGNOSIS — B20 Human immunodeficiency virus [HIV] disease: Secondary | ICD-10-CM

## 2015-10-08 DIAGNOSIS — K220 Achalasia of cardia: Secondary | ICD-10-CM

## 2015-10-08 DIAGNOSIS — Z113 Encounter for screening for infections with a predominantly sexual mode of transmission: Secondary | ICD-10-CM | POA: Diagnosis not present

## 2015-10-08 DIAGNOSIS — Z79899 Other long term (current) drug therapy: Secondary | ICD-10-CM

## 2015-10-08 MED ORDER — RANITIDINE HCL 300 MG PO TABS: 300.0000 mg | ORAL_TABLET | Freq: Every day | ORAL | Status: DC

## 2015-10-08 MED ORDER — TRAMADOL HCL 50 MG PO TABS: 50.0000 mg | ORAL_TABLET | Freq: Three times a day (TID) | ORAL | Status: DC | PRN

## 2015-10-08 MED ORDER — ELVITEG-COBIC-EMTRICIT-TENOFAF 150-150-200-10 MG PO TABS: 1.0000 | ORAL_TABLET | Freq: Every day | ORAL | Status: DC

## 2015-10-08 MED ORDER — DARUNAVIR ETHANOLATE 800 MG PO TABS: 800.0000 mg | ORAL_TABLET | Freq: Every day | ORAL | Status: DC

## 2015-10-08 NOTE — Progress Notes (Signed)
   Subjective:    Patient ID: Benjamin Herrera, male    DOB: [birth year], 48 y.o.   MRN: [id number]  HPI 49 yo M with HIV+, anal condyloma, achalasia, HTN. R shoulder pain (previous rod in his shoulder).  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 Was taking TRV/ISN, had DRVr added at his visit 06-09-12. Then changed to tivicay and complera.  Was "rushed to hospital" recently for stomach pain. Tired of being in pain. May get balloon done on his esophagus as botox is not working anymore. Was to get zantac and vicodin rx from ED but for some reason these rx's did not work.  No problems with ART.   HIV 1 RNA QUANT (copies/mL)  Date Value  03/17/2015 24*  09/04/2014 <20  04/22/2014 <20   CD4 T CELL ABS (/uL)  Date Value  03/17/2015 360*  09/04/2014 410  04/22/2014 320*    Needs to have his blood work done.   Review of Systems Very upset at his pain rx not being filled. Also that he will need to have ART changed.     Objective:   Physical Exam  Constitutional: He appears well-developed and well-nourished.  HENT:  Mouth/Throat: No oropharyngeal exudate.  Eyes: EOM are normal. Pupils are equal, round, and reactive to light.  Neck: Neck supple.  Cardiovascular: Normal rate, regular rhythm and normal heart sounds.   Pulmonary/Chest: Effort normal and breath sounds normal.  Abdominal: Soft. Bowel sounds are normal. There is no tenderness. There is no rebound.  Lymphadenopathy:    He has no cervical adenopathy.       Assessment & Plan:

## 2015-10-08 NOTE — Assessment & Plan Note (Signed)
Will change his ART to genvoya/drv due to his need for PPI.  Will see him back in 3-4 months.  No labs today Flu shot today.

## 2015-10-08 NOTE — Assessment & Plan Note (Signed)
Will give him short, one time, course of ultram, start zantac F/u with GI.

## 2015-10-08 NOTE — Assessment & Plan Note (Signed)
Has surgical f/u in January 2017.

## 2015-10-15 ENCOUNTER — Other Ambulatory Visit: Payer: Self-pay | Admitting: Infectious Diseases

## 2015-10-21 ENCOUNTER — Other Ambulatory Visit: Payer: Self-pay

## 2015-10-21 DIAGNOSIS — R1310 Dysphagia, unspecified: Secondary | ICD-10-CM

## 2015-10-31 ENCOUNTER — Encounter (HOSPITAL_COMMUNITY): Payer: Self-pay | Admitting: *Deleted

## 2015-11-11 ENCOUNTER — Encounter (HOSPITAL_COMMUNITY): Payer: Self-pay | Admitting: *Deleted

## 2015-11-11 ENCOUNTER — Other Ambulatory Visit: Payer: Self-pay

## 2015-11-11 ENCOUNTER — Ambulatory Visit (HOSPITAL_COMMUNITY): Payer: Medicaid Other | Admitting: Anesthesiology

## 2015-11-11 ENCOUNTER — Encounter (HOSPITAL_COMMUNITY)
Admission: RE | Disposition: A | Discharge status: Home / Self Care | Payer: Medicaid Other | Source: Ambulatory Visit | Attending: Gastroenterology

## 2015-11-11 ENCOUNTER — Ambulatory Visit (HOSPITAL_COMMUNITY)
Admission: RE | Admit: 2015-11-11 | Discharge: 2015-11-11 | Disposition: A | Discharge status: Home / Self Care | Payer: Medicaid Other | Source: Ambulatory Visit | Attending: Gastroenterology | Admitting: Gastroenterology

## 2015-11-11 DIAGNOSIS — K319 Disease of stomach and duodenum, unspecified: Secondary | ICD-10-CM | POA: Diagnosis not present

## 2015-11-11 DIAGNOSIS — Z96611 Presence of right artificial shoulder joint: Secondary | ICD-10-CM | POA: Diagnosis not present

## 2015-11-11 DIAGNOSIS — Z79899 Other long term (current) drug therapy: Secondary | ICD-10-CM | POA: Diagnosis not present

## 2015-11-11 DIAGNOSIS — K2980 Duodenitis without bleeding: Secondary | ICD-10-CM | POA: Insufficient documentation

## 2015-11-11 DIAGNOSIS — R1310 Dysphagia, unspecified: Secondary | ICD-10-CM | POA: Insufficient documentation

## 2015-11-11 DIAGNOSIS — K2970 Gastritis, unspecified, without bleeding: Secondary | ICD-10-CM | POA: Diagnosis not present

## 2015-11-11 DIAGNOSIS — I10 Essential (primary) hypertension: Secondary | ICD-10-CM | POA: Diagnosis not present

## 2015-11-11 DIAGNOSIS — K2210 Ulcer of esophagus without bleeding: Secondary | ICD-10-CM | POA: Diagnosis not present

## 2015-11-11 DIAGNOSIS — G4730 Sleep apnea, unspecified: Secondary | ICD-10-CM | POA: Insufficient documentation

## 2015-11-11 DIAGNOSIS — J45909 Unspecified asthma, uncomplicated: Secondary | ICD-10-CM | POA: Insufficient documentation

## 2015-11-11 DIAGNOSIS — B20 Human immunodeficiency virus [HIV] disease: Secondary | ICD-10-CM | POA: Diagnosis not present

## 2015-11-11 DIAGNOSIS — F17210 Nicotine dependence, cigarettes, uncomplicated: Secondary | ICD-10-CM | POA: Diagnosis not present

## 2015-11-11 HISTORY — PX: ESOPHAGOGASTRODUODENOSCOPY (EGD) WITH PROPOFOL: SHX5813

## 2015-11-11 HISTORY — PX: BALLOON DILATION: SHX5330

## 2015-11-11 SURGERY — ESOPHAGOGASTRODUODENOSCOPY (EGD) WITH PROPOFOL
Anesthesia: General

## 2015-11-11 MED ORDER — SODIUM CHLORIDE 0.9 % IV SOLN: INTRAVENOUS | Status: DC

## 2015-11-11 MED ORDER — LIDOCAINE HCL (CARDIAC) 20 MG/ML IV SOLN
INTRAVENOUS | Status: DC | PRN
  Administered 2015-11-11 (×2): 50 mg via INTRAVENOUS

## 2015-11-11 MED ORDER — LIDOCAINE HCL (CARDIAC) 20 MG/ML IV SOLN
INTRAVENOUS | Status: AC
  Filled 2015-11-11: qty 10

## 2015-11-11 MED ORDER — PROPOFOL 500 MG/50ML IV EMUL
INTRAVENOUS | Status: DC | PRN
  Administered 2015-11-11: 100 ug/kg/min via INTRAVENOUS

## 2015-11-11 MED ORDER — PROPOFOL 10 MG/ML IV BOLUS
INTRAVENOUS | Status: AC
  Filled 2015-11-11: qty 20

## 2015-11-11 MED ORDER — PROPOFOL 10 MG/ML IV BOLUS
INTRAVENOUS | Status: DC | PRN
  Administered 2015-11-11 (×2): 30 mg via INTRAVENOUS
  Administered 2015-11-11: 40 mg via INTRAVENOUS

## 2015-11-11 MED ORDER — LACTATED RINGERS IV SOLN
INTRAVENOUS | Status: DC
  Administered 2015-11-11: 1000 mL via INTRAVENOUS

## 2015-11-11 MED ORDER — OMEPRAZOLE 40 MG PO CPDR: 40.0000 mg | DELAYED_RELEASE_CAPSULE | Freq: Every day | ORAL | Status: DC

## 2015-11-11 SURGICAL SUPPLY — 14 items

## 2015-11-11 NOTE — H&P (Signed)
[facility] History and Physical   Primary Care Physician:  Minerva Ends, MD   Reason for Procedure:  Dysphagia, EGJ outflow obstruction  Plan:    EGD with balloon dilation    HPI: Benjamin Herrera is a 50 y.o. male with h/o HIV, dysphagia with EGJ outflow obstruction here for EGD with balloon dilation. Denies any nausea, vomiting, abdominal pain, melena or bright red blood per rectum    Past Medical History  Diagnosis Date  . Polysubstance abuse   . Boils     under arms hx of  . Hemorrhoids   . Herpes labialis   . Psoriasis     hx of  . Achalasia and cardiospasm   . Candidiasis of unspecified site   . Dysphagia   . Asthma   . Candidiasis of mouth   . Fracture, humerus   . Hepatitis 1993    A  . Pilonidal disease 01/10/2012  . Condyloma acuminatum in male of scrotum & anal canal s/p laser ablation 07/03/2012  . 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 HIV (human immunodeficiency virus infection) ([place]) [place]  . Hypertension Dx 2014    off all bp meds for last 9 or 10 months  . 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 Sleep apnea     does not  have CPAP    Past Surgical History  Procedure Laterality Date  . Humeral fracture surgery Right yrs ago  . Right shoulder replacement  2008    x 2  . Esophagogastroduodenoscopy  11/11/2011    Procedure: ESOPHAGOGASTRODUODENOSCOPY (EGD);  Surgeon: Inda Castle, MD;  Location: [facility];  Service: Endoscopy;  Laterality: N/A;  botox injection  called Pt to change time of procedure per Dr Deatra Ina  . Esophagogastroduodenoscopy  03/10/2012    Procedure: ESOPHAGOGASTRODUODENOSCOPY (EGD);  Surgeon: Inda Castle, MD;  Location: [facility];  Service: Endoscopy;  Laterality: N/A;  . Colonoscopy  05/19/2012    Procedure: COLONOSCOPY;  Surgeon: Inda Castle, MD;  [facility]: [facility];  Service: Endoscopy;  Laterality: N/A;  jill  trying to contact pt to come in 0830 for 930 case, phone not accepting messages  . Esophageal manometry  05/29/2012    Procedure: ESOPHAGEAL MANOMETRY (EM);  Surgeon: Inda Castle, MD;  [facility]: [facility];  Service: Endoscopy;  Laterality: N/A;  . Pilonidal cyst excision  07/24/2012    Procedure: CYST EXCISION PILONIDAL SIMPLE;  Surgeon: Adin Hector, MD;  Location: [facility];  Service: General;  Laterality: N/A;  Exam Under Anesthesia,, Excision Pilonidal Disease,   . Examination under anesthesia  07/24/2012    Procedure: EXAM UNDER ANESTHESIA;  Surgeon: Adin Hector, MD;  [facility]: [facility];  Service: General;  Laterality: N/A;  . Wart fulguration  07/24/2012    Procedure: FULGURATION ANAL WART;  Surgeon: Adin Hector, MD;  [facility]: [facility];  Service: General;  Laterality: N/A;  excision of raphe mass  . Esophagogastroduodenoscopy  10/11/2012    Procedure: ESOPHAGOGASTRODUODENOSCOPY (EGD);  Surgeon: Inda Castle, MD;  Location: [facility];  Service: Endoscopy;  Laterality: N/A;  . Botox injection  10/11/2012    Procedure: BOTOX INJECTION;  Surgeon: Inda Castle, MD;  [facility];  Service: Endoscopy;  Laterality: N/A;  . Esophagogastroduodenoscopy N/A 01/25/2013    Procedure: ESOPHAGOGASTRODUODENOSCOPY (EGD);  Surgeon: Inda Castle, MD;  Location: [facility];  Service: Endoscopy;  Laterality:  N/A;  . Botox injection N/A 01/25/2013    Procedure: BOTOX INJECTION;  Surgeon: Inda Castle, MD;  [facility];  Service: Endoscopy;  Laterality: N/A;  . Esophagogastroduodenoscopy N/A 06/21/2013    Procedure: ESOPHAGOGASTRODUODENOSCOPY (EGD);  Surgeon: Inda Castle, MD;  Location: [facility];  Service: Endoscopy;  Laterality: N/A;  . Botox injection N/A 06/21/2013    Procedure: BOTOX INJECTION;  Surgeon: Inda Castle, MD;  [facility];  Service: Endoscopy;  Laterality: N/A;  . Esophagogastroduodenoscopy (egd) with propofol N/A 10/08/2013     Procedure: ESOPHAGOGASTRODUODENOSCOPY (EGD) WITH PROPOFOL;  Surgeon: Inda Castle, MD;  [facility];  Service: Endoscopy;  Laterality: N/A;  . Botox injection N/A 10/08/2013    Procedure: BOTOX INJECTION;  Surgeon: Inda Castle, MD;  [facility];  Service: Endoscopy;  Laterality: N/A;  . Esophagogastroduodenoscopy (egd) with propofol N/A 06/16/2015    Procedure: ESOPHAGOGASTRODUODENOSCOPY (EGD) WITH PROPOFOL;  Surgeon: Inda Castle, MD;  [facility];  Service: Endoscopy;  Laterality: N/A;  . Botox injection N/A 06/16/2015    Procedure: BOTOX INJECTION;  Surgeon: Inda Castle, MD;  [facility];  Service: Endoscopy;  Laterality: N/A;  . Esophageal manometry N/A 10/06/2015    Procedure: ESOPHAGEAL MANOMETRY (EM);  Surgeon: Mauri Pole, MD;  [facility];  Service: Endoscopy;  Laterality: N/A;    Prior to Admission medications   Medication Sig Start Date End Date Taking? Authorizing Provider  Darunavir Ethanolate (PREZISTA) 800 MG tablet Take 1 tablet (800 mg total) by mouth daily with breakfast. 10/08/15  Yes Campbell Riches, MD  elvitegravir-cobicistat-emtricitabine-tenofovir (GENVOYA) 150-150-200-10 MG TABS tablet Take 1 tablet by mouth daily with breakfast. 10/08/15  Yes Campbell Riches, MD  Naphazoline-Pheniramine (OPCON-A OP) Apply 1-2 drops to eye 2 (two) times daily as needed (dry eyes.).    Yes Historical Provider, MD  VOLTAREN 1 % GEL APPLY 2 G TOPICALLY 2 (TWO) TIMES DAILY. 10/15/15  Yes Campbell Riches, MD    Current Facility-Administered Medications  Medication Dose Route Frequency Provider Last Rate Last Dose  . 0.9 %  sodium chloride infusion   Intravenous Continuous Stevie Ertle V, MD      . lactated ringers infusion   Intravenous Continuous Mauri Pole, MD        Allergies as of 10/21/2015  . (No Known Allergies)    Family History  Problem Relation Age of Onset  . Colon cancer Neg Hx    . Stomach cancer Maternal Grandmother     ?  . Diabetes Maternal Grandmother   . Arthritis Mother   . Hypertension Mother   . Heart disease Mother     Social History   Social History  . Marital Status: Single    Spouse Name: N/A  . Number of Children: N/A  . Years of Education: N/A   Occupational History  . Not on file.   Social History Main Topics  . Smoking status: Current Every Day Smoker -- 0.50 packs/day for 25 years    Types: Cigarettes  . Smokeless tobacco: Never Used     Comment: not ready  . Alcohol Use: No  . Drug Use: 7.00 per week    Special: Marijuana     Comment: 04-15-15 pt now denies cocaine use , last cocaine use 4 yrs ago, occasional marijuana use-"none used in a long time."  . Sexual Activity:    Partners: Male     Comment: pt. given condoms  Other Topics Concern  . Not on file   Social History Narrative    Review of Systems:  All other review of systems negative except as mentioned in the HPI.  Physical Exam: Vital signs in last 24 hours:     General:   Alert,  Well-developed, well-nourished, pleasant and cooperative in NAD Lungs:  Clear throughout to auscultation.   Heart:  Regular rate and rhythm; no murmurs, clicks, rubs,  or gallops. Abdomen:  Soft, nontender and nondistended. Normal bowel sounds.   Neuro/Psych:  Alert and cooperative. Normal mood and affect. A and O x 3   @K .Denzil Magnuson, MD [facility] [phone] (pager) 11/11/2015 9:30 AM@

## 2015-11-11 NOTE — Transfer of Care (Signed)
Immediate Anesthesia Transfer of Care Note  Patient: Benjamin Herrera  Procedure(s) Performed: Procedure(s): ESOPHAGOGASTRODUODENOSCOPY (EGD) WITH PROPOFOL (N/A) BALLOON DILATION (N/A)  Patient Location: PACU  Anesthesia Type:MAC  Level of Consciousness: awake, alert  and oriented  Airway & Oxygen Therapy: Patient Spontanous Breathing and Patient connected to nasal cannula oxygen  Post-op Assessment: Report given to RN and Post -op Vital signs reviewed and stable  Post vital signs: Reviewed and stable  Last Vitals:  Filed Vitals:   11/11/15 0932  BP: 157/71  Temp: 37.2 C  Resp: 16    Complications: No apparent anesthesia complications

## 2015-11-11 NOTE — Anesthesia Postprocedure Evaluation (Signed)
Anesthesia Post Note  Patient: Benjamin Herrera  Procedure(s) Performed: Procedure(s) (LRB): ESOPHAGOGASTRODUODENOSCOPY (EGD) WITH PROPOFOL (N/A) BALLOON DILATION (N/A)  Patient location during evaluation: Endoscopy Anesthesia Type: General Level of consciousness: awake and awake and alert Pain management: pain level controlled Vital Signs Assessment: post-procedure vital signs reviewed and stable Respiratory status: spontaneous breathing and nonlabored ventilation Anesthetic complications: no    Last Vitals:  Filed Vitals:   11/11/15 1140 11/11/15 1150  BP: 131/89 144/94  Pulse: 78 69  Temp:    Resp: 19 15    Last Pain: There were no vitals filed for this visit.               Yasenia Reedy COKER

## 2015-11-11 NOTE — Op Note (Signed)
[facility] [place], [address]   ENDOSCOPY PROCEDURE REPORT  PATIENT: Benjamin Herrera, Benjamin Herrera  MR#: [id number] BIRTHDATE: December 02, 1965 , 49  yrs. old GENDER: male ENDOSCOPIST: Harl Bowie, MD REFERRED BY:  Boykin Nearing, MD PROCEDURE DATE:  11/11/2015 PROCEDURE:  EGD w/ balloon dilation and EGD w/ biopsy for H.pylori  ASA CLASS:     Class II INDICATIONS:  dysphagia. MEDICATIONS: Monitored anesthesia care TOPICAL ANESTHETIC: none  DESCRIPTION OF PROCEDURE: After the risks benefits and alternatives of the procedure were thoroughly explained, informed consent was obtained.  The Pentax Gastroscope [id number] endoscope was introduced through the mouth and advanced to the second portion of the duodenum , Without limitations.  The instrument was slowly withdrawn as the mucosa was fully examined.   Squamocolumnar junction at 42cm, LA gade b erosive esophagitis. mild resistance in passsing the sope from esophagus into the stomach at the GE junction.  gastric antral erosions noted, no biopsies were taken from the gastric mucosa to rule out H.  pylori. mild duodenitis in the duodenal bulb, second part off the duodenum appeared normal.  CRE balloon dilation performed from 18-19-20mm with minimal heme on the ballon.  Retroflexed views revealed no abnormalities.     The scope was then withdrawn from the patient and the procedure completed.  COMPLICATIONS: There were no immediate complications.  ENDOSCOPIC IMPRESSION: Squamocolumnar junction at 42cm, LA gade b erosive esophagitis. mild resistance in passsing the sope from esophagus into the stomach at the GE junction.  gastric antral erosions noted, no biopsies were taken from the gastric mucosa to rule out H.  pylori. mild duodenitis in the duodenal bulb, second part off the duodenum appeared normal.  CRE balloon dilation performed from 18-19-20mm with minimal heme on the ballon  RECOMMENDATIONS: 1.  Await  pathology results 2.  Anti-reflux regimen to be follow 3.  Avoid NSAIDS for two weeks 4. PPI once daily 5. follow up in office visit in 6-8 weeks  REPEAT EXAM: as needed  eSigned:  Harl Bowie, MD 11/11/2015 11:33 AM

## 2015-11-11 NOTE — Anesthesia Preprocedure Evaluation (Signed)
Anesthesia Evaluation  Patient identified by MRN, date of birth, ID band Patient awake    Reviewed: Allergy & Precautions, NPO status   Airway Mallampati: II  TM Distance: >3 FB Neck ROM: Full    Dental  (+) Teeth Intact   Pulmonary Current Smoker,    breath sounds clear to auscultation       Cardiovascular hypertension,  Rhythm:Regular Rate:Normal     Neuro/Psych    GI/Hepatic   Endo/Other    Renal/GU      Musculoskeletal   Abdominal   Peds  Hematology   Anesthesia Other Findings   Reproductive/Obstetrics                             Anesthesia Physical Anesthesia Plan  ASA: III  Anesthesia Plan: General   Post-op Pain Management:    Induction: Intravenous  Airway Management Planned: Oral ETT  Additional Equipment:   Intra-op Plan:   Post-operative Plan: Extubation in OR  Informed Consent: I have reviewed the patients History and Physical, chart, labs and discussed the procedure including the risks, benefits and alternatives for the proposed anesthesia with the patient or authorized representative who has indicated his/her understanding and acceptance.   Dental advisory given  Plan Discussed with: CRNA and Anesthesiologist  Anesthesia Plan Comments:         Anesthesia Quick Evaluation

## 2015-11-11 NOTE — Discharge Instructions (Signed)

## 2015-11-12 ENCOUNTER — Other Ambulatory Visit: Payer: Self-pay

## 2015-11-12 ENCOUNTER — Encounter (HOSPITAL_COMMUNITY): Payer: Self-pay | Admitting: Gastroenterology

## 2015-11-12 ENCOUNTER — Telehealth: Payer: Self-pay | Admitting: Gastroenterology

## 2015-11-12 MED ORDER — OMEPRAZOLE 40 MG PO CPDR
40.0000 mg | DELAYED_RELEASE_CAPSULE | Freq: Every day | ORAL | Status: DC
Start: 2015-11-12 — End: 2016-01-07

## 2015-11-12 NOTE — Telephone Encounter (Signed)
Called [facility] and changed the prescriber to Alonza Bogus, [place]. Contacted the patient and advised.

## 2016-01-06 ENCOUNTER — Other Ambulatory Visit: Payer: Medicaid Other

## 2016-01-07 ENCOUNTER — Ambulatory Visit (INDEPENDENT_AMBULATORY_CARE_PROVIDER_SITE_OTHER): Payer: Medicaid Other | Admitting: Gastroenterology

## 2016-01-07 ENCOUNTER — Other Ambulatory Visit (HOSPITAL_COMMUNITY)
Admission: RE | Admit: 2016-01-07 | Discharge: 2016-01-07 | Disposition: A | Discharge status: Home / Self Care | Payer: Medicaid Other | Source: Ambulatory Visit | Attending: Infectious Diseases | Admitting: Infectious Diseases

## 2016-01-07 ENCOUNTER — Encounter: Payer: Self-pay | Admitting: Gastroenterology

## 2016-01-07 ENCOUNTER — Other Ambulatory Visit: Payer: Medicaid Other

## 2016-01-07 VITALS — BP 130/80 | HR 84 | Ht 73.5 in | Wt 236.6 lb

## 2016-01-07 DIAGNOSIS — B20 Human immunodeficiency virus [HIV] disease: Secondary | ICD-10-CM

## 2016-01-07 DIAGNOSIS — R1310 Dysphagia, unspecified: Secondary | ICD-10-CM | POA: Diagnosis not present

## 2016-01-07 DIAGNOSIS — Z79899 Other long term (current) drug therapy: Secondary | ICD-10-CM

## 2016-01-07 DIAGNOSIS — K219 Gastro-esophageal reflux disease without esophagitis: Secondary | ICD-10-CM

## 2016-01-07 DIAGNOSIS — Z113 Encounter for screening for infections with a predominantly sexual mode of transmission: Secondary | ICD-10-CM | POA: Diagnosis present

## 2016-01-07 MED ORDER — OMEPRAZOLE 40 MG PO CPDR: 40.0000 mg | DELAYED_RELEASE_CAPSULE | Freq: Every day | ORAL | Status: DC

## 2016-01-07 NOTE — Progress Notes (Signed)
Benjamin Herrera    [id number]    1966/04/20  Primary Care Physician:FUNCHES, Lennox Laity, MD  Referring Physician: Boykin Nearing, MD [address] [place], [place] [address]  Chief complaint: Dysphagia  HPI:  50 year old male with history of HIV on haart here for follow-up after EGD with dilation .  Prior to the esophageal dilation he had solid and liquid dysphagia associated with regurgitation. Previously followed by Dr. Deatra Ina. He has had multiple Botox injections with transient improvement in symptoms, last Botox injection was in August 2016. He was on trazodone in the past with no significant improvement and has also tried diltiazem which was helping initially but but subsequently did not help and has stopped taking it. 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 Status post esophageal dilation, reports significant improvement in his swallowing, no longer has difficulty with liquids but continues to have spasms once in a while infrequently. Weight stable. Denies any odynophagia, regurgitation, nausea, vomiting, abdominal pain, melena or bright red blood per rectum.   Outpatient Encounter Prescriptions as of 01/07/2016  Medication Sig  . Darunavir Ethanolate (PREZISTA) 800 MG tablet Take 1 tablet (800 mg total) by mouth daily with breakfast.  . elvitegravir-cobicistat-emtricitabine-tenofovir (GENVOYA) 150-150-200-10 MG TABS tablet Take 1 tablet by mouth daily with breakfast.  . Naphazoline-Pheniramine (OPCON-A OP) Apply 1-2 drops to eye 2 (two) times daily as needed (dry eyes.).   Marland Kitchen omeprazole (PRILOSEC) 40 MG capsule Take 1 capsule (40 mg total) by mouth daily before breakfast.  . VOLTAREN 1 % GEL APPLY 2 G TOPICALLY 2 (TWO) TIMES DAILY.  . [DISCONTINUED] omeprazole (PRILOSEC) 40 MG capsule Take 1 capsule (40 mg total) by mouth daily before breakfast.   No facility-administered encounter medications on file as of 01/07/2016.     Allergies as of 01/07/2016  . (No Known Allergies)    Past Medical History  Diagnosis Date  . Polysubstance abuse   . Boils     under arms hx of  . Hemorrhoids   . Herpes labialis   . Psoriasis     hx of  . Achalasia and cardiospasm   . Candidiasis of unspecified site   . Dysphagia   . Asthma   . Candidiasis of mouth   . Fracture, humerus   . Hepatitis 1993    A  . Pilonidal disease 01/10/2012  . Condyloma acuminatum in male of scrotum & anal canal s/p laser ablation 07/03/2012  . 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 HIV (human immunodeficiency virus infection) ([place]) [place]  . Hypertension Dx 2014    off all bp meds for last 9 or 10 months  . 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 Sleep apnea     does not  have CPAP    Past Surgical History  Procedure Laterality Date  . Humeral fracture surgery Right yrs ago  . Right shoulder replacement  2008    x 2  . Esophagogastroduodenoscopy  11/11/2011    Procedure: ESOPHAGOGASTRODUODENOSCOPY (EGD);  Surgeon: Inda Castle, MD;  Location: [facility];  Service: Endoscopy;  Laterality: N/A;  botox injection  called Pt to change time of procedure per Dr Deatra Ina  . Esophagogastroduodenoscopy  03/10/2012    Procedure: ESOPHAGOGASTRODUODENOSCOPY (EGD);  Surgeon: Inda Castle, MD;  Location: [facility];  Service: Endoscopy;  Laterality: N/A;  . Colonoscopy  05/19/2012  Procedure: COLONOSCOPY;  Surgeon: Inda Castle, MD;  [facility];  Service: Endoscopy;  Laterality: N/A;  jill trying to contact pt to come in 0830 for 930 case, phone not accepting messages  . Esophageal manometry  05/29/2012    Procedure: ESOPHAGEAL MANOMETRY (EM);  Surgeon: Inda Castle, MD;  [facility];  Service: Endoscopy;  Laterality: N/A;  . Pilonidal cyst excision  07/24/2012    Procedure: CYST EXCISION PILONIDAL SIMPLE;  Surgeon: Adin Hector, MD;  Location: [facility];  Service: General;  Laterality: N/A;  Exam Under Anesthesia,, Excision Pilonidal Disease,   . Examination under anesthesia  07/24/2012    Procedure: EXAM UNDER ANESTHESIA;  Surgeon: Adin Hector, MD;  [facility];  Service: General;  Laterality: N/A;  . Wart fulguration  07/24/2012    Procedure: FULGURATION ANAL WART;  Surgeon: Adin Hector, MD;  [facility]: [facility];  Service: General;  Laterality: N/A;  excision of raphe mass  . Esophagogastroduodenoscopy  10/11/2012    Procedure: ESOPHAGOGASTRODUODENOSCOPY (EGD);  Surgeon: Inda Castle, MD;  Location: [facility];  Service: Endoscopy;  Laterality: N/A;  . Botox injection  10/11/2012    Procedure: BOTOX INJECTION;  Surgeon: Inda Castle, MD;  [facility];  Service: Endoscopy;  Laterality: N/A;  . Esophagogastroduodenoscopy N/A 01/25/2013    Procedure: ESOPHAGOGASTRODUODENOSCOPY (EGD);  Surgeon: Inda Castle, MD;  Location: [facility];  Service: Endoscopy;  Laterality: N/A;  . Botox injection N/A 01/25/2013    Procedure: BOTOX INJECTION;  Surgeon: Inda Castle, MD;  [facility];  Service: Endoscopy;  Laterality: N/A;  . Esophagogastroduodenoscopy N/A 06/21/2013    Procedure: ESOPHAGOGASTRODUODENOSCOPY (EGD);  Surgeon: Inda Castle, MD;  Location: [facility];  Service: Endoscopy;  Laterality: N/A;  . Botox injection N/A 06/21/2013    Procedure: BOTOX INJECTION;  Surgeon: Inda Castle, MD;  [facility];  Service: Endoscopy;  Laterality: N/A;  . Esophagogastroduodenoscopy (egd) with propofol N/A 10/08/2013    Procedure: ESOPHAGOGASTRODUODENOSCOPY (EGD) WITH PROPOFOL;  Surgeon: Inda Castle, MD;  [facility];  Service: Endoscopy;  Laterality: N/A;  . Botox injection N/A 10/08/2013    Procedure: BOTOX INJECTION;  Surgeon: Inda Castle, MD;  [facility];  Service: Endoscopy;  Laterality: N/A;  . Esophagogastroduodenoscopy (egd) with  propofol N/A 06/16/2015    Procedure: ESOPHAGOGASTRODUODENOSCOPY (EGD) WITH PROPOFOL;  Surgeon: Inda Castle, MD;  [facility];  Service: Endoscopy;  Laterality: N/A;  . Botox injection N/A 06/16/2015    Procedure: BOTOX INJECTION;  Surgeon: Inda Castle, MD;  [facility];  Service: Endoscopy;  Laterality: N/A;  . Esophageal manometry N/A 10/06/2015    Procedure: ESOPHAGEAL MANOMETRY (EM);  Surgeon: Mauri Pole, MD;  [facility];  Service: Endoscopy;  Laterality: N/A;  . Esophagogastroduodenoscopy (egd) with propofol N/A 11/11/2015    Procedure: ESOPHAGOGASTRODUODENOSCOPY (EGD) WITH PROPOFOL;  Surgeon: Mauri Pole, MD;  [facility];  Service: Endoscopy;  Laterality: N/A;  . Balloon dilation N/A 11/11/2015    Procedure: BALLOON DILATION;  Surgeon: Mauri Pole, MD;  [facility];  Service: Endoscopy;  Laterality: N/A;    Family History  Problem Relation Age of Onset  . Colon cancer Neg Hx   . Stomach cancer Maternal Grandmother     ?  . Diabetes Maternal Grandmother   . Arthritis Mother   . Hypertension Mother   . Heart disease Mother     Social History  Social History  . Marital Status: Single    Spouse Name: N/A  . Number of Children: N/A  . Years of Education: N/A   Occupational History  . Not on file.   Social History Main Topics  . Smoking status: Current Every Day Smoker -- 0.50 packs/day for 25 years    Types: Cigarettes  . Smokeless tobacco: Never Used     Comment: not ready  . Alcohol Use: No  . Drug Use: 7.00 per week    Special: Marijuana     Comment: 04-15-15 pt now denies cocaine use , last cocaine use 4 yrs ago, occasional marijuana use-"none used in a long time."  . Sexual Activity:    Partners: Male     Comment: pt. given condoms   Other Topics Concern  . Not on file   Social History Narrative      Review of systems: Review of Systems  Constitutional: Negative for fever  and chills.  HENT: Negative.   Eyes: Negative for blurred vision.  Respiratory: Negative for cough, shortness of breath and wheezing.   Cardiovascular: Negative for chest pain and palpitations.  Gastrointestinal: as per HPI Genitourinary: Negative for dysuria, urgency, frequency and hematuria.  Musculoskeletal: Negative for myalgias, back pain and joint pain.  Skin: Negative for itching and rash.  Neurological: Negative for dizziness, tremors, focal weakness, seizures and loss of consciousness.  Endo/Heme/Allergies: Negative for environmental allergies.  Psychiatric/Behavioral: Negative for depression, suicidal ideas and hallucinations.  All other systems reviewed and are negative.   Physical Exam: Filed Vitals:   01/07/16 1430  BP: 130/80  Pulse: 84   Gen:      No acute distress HEENT:  EOMI, sclera anicteric Neck:     No masses; no thyromegaly Lungs:    Clear to auscultation bilaterally; normal respiratory effort CV:         Regular rate and rhythm; no murmurs Abd:      + bowel sounds; soft, non-tender; no palpable masses, no distension Ext:    No edema; adequate peripheral perfusion Skin:      Warm and dry; no rash Neuro: alert and oriented x 3 Psych: normal mood and affect  Data Reviewed: Reviewed chart in epic Colonoscopy 2013: Normal  Assessment and Plan/Recommendations:  50 year old male with dysphagia, esophageal manometry suggestive of EGJ outflow obstruction with significant improvement of symptoms status post EGD junction dilation here for follow-up visit Continue PPI daily, 30 minutes before breakfast Follow antireflux measures Due for screening colonoscopy in 2023 Return in 1 year  K. Denzil Magnuson , MD [phone] Mon-Fri 8a-5p [phone] after 5p, weekends, holidays

## 2016-01-07 NOTE — Patient Instructions (Signed)
Follow up in one year We will refill your omeprazole for 1 year

## 2016-01-08 LAB — T-HELPER CELL (CD4) - (RCID CLINIC ONLY)
CD4 T CELL ABS: 360 /uL — AB (ref 400–2700)
CD4 T CELL HELPER: 17 % — AB (ref 33–55)

## 2016-01-08 LAB — COMPLETE METABOLIC PANEL WITH GFR
ALT: 13 U/L (ref 9–46)
AST: 14 U/L (ref 10–40)
Albumin: 3.6 g/dL (ref 3.6–5.1)
Alkaline Phosphatase: 61 U/L (ref 40–115)
BUN: 9 mg/dL (ref 7–25)
CHLORIDE: 103 mmol/L (ref 98–110)
CO2: 27 mmol/L (ref 20–31)
Calcium: 8.6 mg/dL (ref 8.6–10.3)
Creat: 1.09 mg/dL (ref 0.60–1.35)
GFR, EST NON AFRICAN AMERICAN: 79 mL/min (ref >=60)
Glucose, Bld: 109 mg/dL — ABNORMAL HIGH (ref 65–99)
Potassium: 3.6 mmol/L (ref 3.5–5.3)
Sodium: 142 mmol/L (ref 135–146)
Total Bilirubin: 0.5 mg/dL (ref 0.2–1.2)
Total Protein: 6.9 g/dL (ref 6.1–8.1)

## 2016-01-08 LAB — LIPID PANEL
Cholesterol: 180 mg/dL (ref 125–200)
HDL: 31 mg/dL — AB (ref >=40)
LDL CALC: 124 mg/dL (ref <130)
TRIGLYCERIDES: 124 mg/dL (ref <150)
Total CHOL/HDL Ratio: 5.8 Ratio — ABNORMAL HIGH (ref <=5.0)
VLDL: 25 mg/dL (ref <30)

## 2016-01-08 LAB — HIV-1 RNA QUANT-NO REFLEX-BLD: HIV-1 RNA Quant, Log: <1.3 Log copies/mL (ref <1.30)

## 2016-01-08 LAB — URINE CYTOLOGY ANCILLARY ONLY
Chlamydia: NEGATIVE
Neisseria Gonorrhea: NEGATIVE

## 2016-01-08 LAB — CBC
HCT: 39.6 % (ref 39.0–52.0)
Hemoglobin: 13.1 g/dL (ref 13.0–17.0)
MCH: 25.4 pg — AB (ref 26.0–34.0)
MCHC: 33.1 g/dL (ref 30.0–36.0)
MCV: 76.7 fL — AB (ref 78.0–100.0)
MPV: 8.8 fL (ref 8.6–12.4)
PLATELETS: 304 10*3/uL (ref 150–400)
RBC: 5.16 MIL/uL (ref 4.22–5.81)
RDW: 15.9 % — AB (ref 11.5–15.5)
WBC: 6.5 10*3/uL (ref 4.0–10.5)

## 2016-01-08 LAB — RPR

## 2016-01-14 LAB — HLA B*5701: HLA-B*5701 w/rflx HLA-B High: NEGATIVE

## 2016-01-21 ENCOUNTER — Ambulatory Visit: Payer: Medicaid Other | Admitting: Infectious Diseases

## 2016-04-05 ENCOUNTER — Other Ambulatory Visit: Payer: Medicaid Other

## 2016-04-13 ENCOUNTER — Ambulatory Visit: Payer: Medicaid Other | Admitting: Infectious Diseases

## 2016-04-19 ENCOUNTER — Ambulatory Visit: Payer: Medicaid Other | Admitting: Infectious Diseases

## 2016-05-06 ENCOUNTER — Emergency Department (HOSPITAL_COMMUNITY)
Admission: EM | Admit: 2016-05-06 | Discharge: 2016-05-07 | Disposition: A | Discharge status: Home / Self Care | Payer: Medicaid Other | Attending: Emergency Medicine | Admitting: Emergency Medicine

## 2016-05-06 ENCOUNTER — Encounter (HOSPITAL_COMMUNITY): Payer: Self-pay | Admitting: Emergency Medicine

## 2016-05-06 ENCOUNTER — Emergency Department (HOSPITAL_COMMUNITY): Payer: Medicaid Other

## 2016-05-06 DIAGNOSIS — Z79899 Other long term (current) drug therapy: Secondary | ICD-10-CM | POA: Insufficient documentation

## 2016-05-06 DIAGNOSIS — Z21 Asymptomatic human immunodeficiency virus [HIV] infection status: Secondary | ICD-10-CM | POA: Diagnosis not present

## 2016-05-06 DIAGNOSIS — R1310 Dysphagia, unspecified: Secondary | ICD-10-CM

## 2016-05-06 DIAGNOSIS — Z85828 Personal history of other malignant neoplasm of skin: Secondary | ICD-10-CM | POA: Diagnosis not present

## 2016-05-06 DIAGNOSIS — I10 Essential (primary) hypertension: Secondary | ICD-10-CM | POA: Insufficient documentation

## 2016-05-06 DIAGNOSIS — F17210 Nicotine dependence, cigarettes, uncomplicated: Secondary | ICD-10-CM | POA: Diagnosis not present

## 2016-05-06 DIAGNOSIS — E876 Hypokalemia: Secondary | ICD-10-CM | POA: Diagnosis not present

## 2016-05-06 DIAGNOSIS — Z96611 Presence of right artificial shoulder joint: Secondary | ICD-10-CM | POA: Insufficient documentation

## 2016-05-06 DIAGNOSIS — J45909 Unspecified asthma, uncomplicated: Secondary | ICD-10-CM | POA: Insufficient documentation

## 2016-05-06 LAB — URINE MICROSCOPIC-ADD ON

## 2016-05-06 LAB — CBC
HEMATOCRIT: 45.7 % (ref 39.0–52.0)
HEMOGLOBIN: 15.6 g/dL (ref 13.0–17.0)
MCH: 26 pg (ref 26.0–34.0)
MCHC: 34.1 g/dL (ref 30.0–36.0)
MCV: 76 fL — ABNORMAL LOW (ref 78.0–100.0)
Platelets: 180 10*3/uL (ref 150–400)
RBC: 6.01 MIL/uL — ABNORMAL HIGH (ref 4.22–5.81)
RDW: 13.8 % (ref 11.5–15.5)
WBC: 4.9 10*3/uL (ref 4.0–10.5)

## 2016-05-06 LAB — URINALYSIS, ROUTINE W REFLEX MICROSCOPIC
Glucose, UA: NEGATIVE mg/dL
Ketones, ur: 15 mg/dL — AB
Leukocytes, UA: NEGATIVE
Nitrite: NEGATIVE
PH: 6 (ref 5.0–8.0)
Protein, ur: 30 mg/dL — AB
SPECIFIC GRAVITY, URINE: 1.031 — AB (ref 1.005–1.030)

## 2016-05-06 LAB — COMPREHENSIVE METABOLIC PANEL
ALBUMIN: 4.5 g/dL (ref 3.5–5.0)
ALK PHOS: 68 U/L (ref 38–126)
ALT: 25 U/L (ref 17–63)
ANION GAP: 10 (ref 5–15)
AST: 22 U/L (ref 15–41)
BUN: 10 mg/dL (ref 6–20)
CALCIUM: 9.2 mg/dL (ref 8.9–10.3)
CO2: 27 mmol/L (ref 22–32)
Chloride: 96 mmol/L — ABNORMAL LOW (ref 101–111)
Creatinine, Ser: 1.41 mg/dL — ABNORMAL HIGH (ref 0.61–1.24)
GFR calc Af Amer: >60 mL/min (ref >60)
GFR calc non Af Amer: 57 mL/min — ABNORMAL LOW (ref >60)
GLUCOSE: 100 mg/dL — AB (ref 65–99)
POTASSIUM: 2.9 mmol/L — AB (ref 3.5–5.1)
SODIUM: 133 mmol/L — AB (ref 135–145)
Total Bilirubin: 0.9 mg/dL (ref 0.3–1.2)
Total Protein: 7.8 g/dL (ref 6.5–8.1)

## 2016-05-06 LAB — LIPASE, BLOOD: Lipase: 34 U/L (ref 11–51)

## 2016-05-06 LAB — POC OCCULT BLOOD, ED: Fecal Occult Bld: NEGATIVE

## 2016-05-06 MED ORDER — SODIUM CHLORIDE 0.9 % IV BOLUS (SEPSIS)
1000.0000 mL | Freq: Once | INTRAVENOUS | Status: AC
  Administered 2016-05-06: 1000 mL via INTRAVENOUS

## 2016-05-06 MED ORDER — POTASSIUM CHLORIDE 10 MEQ/100ML IV SOLN
10.0000 meq | INTRAVENOUS | Status: AC
  Administered 2016-05-06 – 2016-05-07 (×3): 10 meq via INTRAVENOUS
  Filled 2016-05-06 (×3): qty 100

## 2016-05-06 MED ORDER — ONDANSETRON HCL 4 MG/2ML IJ SOLN
4.0000 mg | Freq: Once | INTRAMUSCULAR | Status: AC
  Administered 2016-05-06: 4 mg via INTRAVENOUS
  Filled 2016-05-06: qty 2

## 2016-05-06 NOTE — ED Provider Notes (Signed)
CSN: [id number]     Arrival date & time 05/06/16  1755 History  By signing my name below, I, Benjamin Herrera, attest that this documentation has been prepared under Benjamin direction and in Benjamin presence of [name], NP. Electronically Signed: [name], ED Scribe. 05/06/2016. 11:14 PM.   No chief complaint on file.   Benjamin history is provided by Benjamin patient. No language interpreter was used.   HPI Comments: Benjamin Herrera is a 50 y.o. male with a hx of HIV, HTN, hepatitis A, psoriasis, esophageal strictures, and polysubstance abuse who presents to [facility] complaining of constant, moderate, epigastric abdominal pain onset 1 week ago. Pt states that he has been unable to eat solid foods for Benjamin past week because each time he swallows Benjamin food gets stuck in Benjamin lower part of his chest after which he regurgitates his food. He reports associated general malaise, nausea, vomiting, chills, subjective fever, lightheadedness when he stands up, and black stools. He has been able to drink small amounts of water. He took Pepto-Bismal 2 nights ago and his prescribed medications without relief. He notes similar symptoms in Benjamin past, most recently 4 months ago when he had an esophageal stricture that required dilation for treatment. Denies diarrhea or any other symptoms currently.  Past Medical History  Diagnosis Date  . Polysubstance abuse   . Boils     under arms hx of  . Hemorrhoids   . Herpes labialis   . Psoriasis     hx of  . Achalasia and cardiospasm   . Candidiasis of unspecified site   . Dysphagia   . Asthma   . Candidiasis of mouth   . Fracture, humerus   . Hepatitis 1993    A  . Pilonidal disease 01/10/2012  . Condyloma acuminatum in male of scrotum & anal canal s/p laser ablation 07/03/2012  . 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 HIV (human immunodeficiency virus infection) ([place]) [place]  . Hypertension Dx 2014    off all  bp meds for last 9 or 10 months  . 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 Sleep apnea     does not  have CPAP   Past Surgical History  Procedure Laterality Date  . Humeral fracture surgery Right yrs ago  . Right shoulder replacement  2008    x 2  . Esophagogastroduodenoscopy  11/11/2011    Procedure: ESOPHAGOGASTRODUODENOSCOPY (EGD);  Surgeon: Inda Castle, MD;  Location: [facility];  Service: Endoscopy;  Laterality: N/A;  botox injection  called Pt to change time of procedure per Dr Deatra Ina  . Esophagogastroduodenoscopy  03/10/2012    Procedure: ESOPHAGOGASTRODUODENOSCOPY (EGD);  Surgeon: Inda Castle, MD;  Location: [facility];  Service: Endoscopy;  Laterality: N/A;  . Colonoscopy  05/19/2012    Procedure: COLONOSCOPY;  Surgeon: Inda Castle, MD;  Location: [facility];  Service: Endoscopy;  Laterality: N/A;  jill trying to contact pt to come in 0830 for 930 case, phone not accepting messages  . Esophageal manometry  05/29/2012    Procedure: ESOPHAGEAL MANOMETRY (EM);  Surgeon: Inda Castle, MD;  Location: [facility];  Service: Endoscopy;  Laterality: N/A;  . Pilonidal cyst excision  07/24/2012    Procedure: CYST EXCISION PILONIDAL SIMPLE;  Surgeon: Adin Hector, MD;  Location: [facility];  Service: General;  Laterality: N/A;  Exam Under Anesthesia,, Excision Pilonidal Disease,   .  Examination under anesthesia  07/24/2012    Procedure: EXAM UNDER ANESTHESIA;  Surgeon: Adin Hector, MD;  Location: [facility];  Service: General;  Laterality: N/A;  . Wart fulguration  07/24/2012    Procedure: FULGURATION ANAL WART;  Surgeon: Adin Hector, MD;  Location: [facility];  Service: General;  Laterality: N/A;  excision of raphe mass  . Esophagogastroduodenoscopy  10/11/2012    Procedure: ESOPHAGOGASTRODUODENOSCOPY (EGD);  Surgeon: Inda Castle, MD;  Location: [facility];  Service: Endoscopy;  Laterality: N/A;  . Botox injection  10/11/2012     Procedure: BOTOX INJECTION;  Surgeon: Inda Castle, MD;  Location: [facility];  Service: Endoscopy;  Laterality: N/A;  . Esophagogastroduodenoscopy N/A 01/25/2013    Procedure: ESOPHAGOGASTRODUODENOSCOPY (EGD);  Surgeon: Inda Castle, MD;  Location: [facility];  Service: Endoscopy;  Laterality: N/A;  . Botox injection N/A 01/25/2013    Procedure: BOTOX INJECTION;  Surgeon: Inda Castle, MD;  Location: [facility];  Service: Endoscopy;  Laterality: N/A;  . Esophagogastroduodenoscopy N/A 06/21/2013    Procedure: ESOPHAGOGASTRODUODENOSCOPY (EGD);  Surgeon: Inda Castle, MD;  Location: [facility];  Service: Endoscopy;  Laterality: N/A;  . Botox injection N/A 06/21/2013    Procedure: BOTOX INJECTION;  Surgeon: Inda Castle, MD;  Location: [facility];  Service: Endoscopy;  Laterality: N/A;  . Esophagogastroduodenoscopy (egd) with propofol N/A 10/08/2013    Procedure: ESOPHAGOGASTRODUODENOSCOPY (EGD) WITH PROPOFOL;  Surgeon: Inda Castle, MD;  Location: [facility];  Service: Endoscopy;  Laterality: N/A;  . Botox injection N/A 10/08/2013    Procedure: BOTOX INJECTION;  Surgeon: Inda Castle, MD;  Location: [facility];  Service: Endoscopy;  Laterality: N/A;  . Esophagogastroduodenoscopy (egd) with propofol N/A 06/16/2015    Procedure: ESOPHAGOGASTRODUODENOSCOPY (EGD) WITH PROPOFOL;  Surgeon: Inda Castle, MD;  Location: [facility];  Service: Endoscopy;  Laterality: N/A;  . Botox injection N/A 06/16/2015    Procedure: BOTOX INJECTION;  Surgeon: Inda Castle, MD;  Location: [facility];  Service: Endoscopy;  Laterality: N/A;  . Esophageal manometry N/A 10/06/2015    Procedure: ESOPHAGEAL MANOMETRY (EM);  Surgeon: Mauri Pole, MD;  Location: [facility];  Service: Endoscopy;  Laterality: N/A;  . Esophagogastroduodenoscopy (egd) with propofol N/A 11/11/2015    Procedure: ESOPHAGOGASTRODUODENOSCOPY (EGD) WITH PROPOFOL;  Surgeon: Mauri Pole, MD;  Location:  [facility];  Service: Endoscopy;  Laterality: N/A;  . Balloon dilation N/A 11/11/2015    Procedure: BALLOON DILATION;  Surgeon: Mauri Pole, MD;  Location: [facility];  Service: Endoscopy;  Laterality: N/A;   Family History  Problem Relation Age of Onset  . Colon cancer Neg Hx   . Stomach cancer Maternal Grandmother     ?  . Diabetes Maternal Grandmother   . Arthritis Mother   . Hypertension Mother   . Heart disease Mother    Social History  Substance Use Topics  . Smoking status: Current Every Day Smoker -- 0.50 packs/day for 25 years    Types: Cigarettes  . Smokeless tobacco: Never Used     Comment: not ready  . Alcohol Use: No    Review of Systems  Constitutional: Positive for fever (subjective), chills and unexpected weight change.  Respiratory: Negative for cough and shortness of breath.   Gastrointestinal: Positive for nausea, vomiting and abdominal pain. Negative for diarrhea and constipation.  Genitourinary: Negative for dysuria.  Neurological: Positive for weakness.  All other systems reviewed and are negative.     Allergies  Review of  patient's allergies indicates no known allergies.  Home Medications   Prior to Admission medications   Medication Sig Start Date End Date Taking? Authorizing Provider  Darunavir Ethanolate (PREZISTA) 800 MG tablet Take 1 tablet (800 mg total) by mouth daily with breakfast. Patient taking differently: Take 800 mg by mouth every evening.  10/08/15  Yes Campbell Riches, MD  elvitegravir-cobicistat-emtricitabine-tenofovir (GENVOYA) 150-150-200-10 MG TABS tablet Take 1 tablet by mouth daily with breakfast. Patient taking differently: Take 1 tablet by mouth every evening.  10/08/15  Yes Campbell Riches, MD  Naphazoline-Pheniramine (OPCON-A OP) Apply 1-2 drops to eye 2 (two) times daily as needed (dry eyes.).     Historical Provider, MD  omeprazole (PRILOSEC) 40 MG capsule Take 1 capsule (40 mg total) by mouth daily before  breakfast. Patient not taking: Reported on 05/06/2016 01/07/16   Mauri Pole, MD  VOLTAREN 1 % GEL APPLY 2 G TOPICALLY 2 (TWO) TIMES DAILY. Patient not taking: Reported on 05/06/2016 10/15/15   Campbell Riches, MD   BP 134/77 mmHg  Pulse 85  Temp(Src) 98.4 F (36.9 C) (Oral)  Resp 26  Ht 6\' 1"  (1.854 m)  Wt 104.327 kg  BMI 30.35 kg/m2  SpO2 98% Physical Exam  Constitutional: He is oriented to person, place, and time. He appears well-developed and well-nourished. No distress.  HENT:  Head: Normocephalic and atraumatic.  Mouth/Throat: Oropharynx is clear and moist.  Eyes: Conjunctivae are normal. Pupils are equal, round, and reactive to light.  Neck: Normal range of motion.  Cardiovascular: Normal rate and regular rhythm.   Pulmonary/Chest: Effort normal. No respiratory distress.  Abdominal: Soft. Bowel sounds are normal. He exhibits no distension. There is no tenderness.  Musculoskeletal: Normal range of motion.  Lymphadenopathy:    He has no cervical adenopathy.  Neurological: He is alert and oriented to person, place, and time.  Skin: Skin is warm and dry.  Psychiatric: He has a normal mood and affect. His behavior is normal.  Nursing note and vitals reviewed.   ED Course  Procedures   DIAGNOSTIC STUDIES: Oxygen Saturation is 98% on RA, normal by my interpretation.    COORDINATION OF CARE: 9:14 PM Ordered POC occult blood lab. Discussed treatment plan with pt at bedside and pt agreed to plan.  10:45 PM Ordered IV fluids, Zofran, potassium chloride, and abdomen x-ray.  Labs Review Labs Reviewed  COMPREHENSIVE METABOLIC PANEL - Abnormal; Notable for Benjamin following:    Sodium 133 (*)    Potassium 2.9 (*)    Chloride 96 (*)    Glucose, Bld 100 (*)    Creatinine, Ser 1.41 (*)    GFR calc non Af Amer 57 (*)    All other components within normal limits  CBC - Abnormal; Notable for Benjamin following:    RBC 6.01 (*)    MCV 76.0 (*)    All other components within  normal limits  URINALYSIS, ROUTINE W REFLEX MICROSCOPIC (NOT AT [facility]) - Abnormal; Notable for Benjamin following:    Color, Urine AMBER (*)    APPearance CLOUDY (*)    Specific Gravity, Urine 1.031 (*)    Hgb urine dipstick SMALL (*)    Bilirubin Urine SMALL (*)    Ketones, ur 15 (*)    Protein, ur 30 (*)    All other components within normal limits  URINE MICROSCOPIC-ADD ON - Abnormal; Notable for Benjamin following:    Squamous Epithelial / LPF 0-5 (*)    Bacteria, UA FEW (*)  Casts HYALINE CASTS (*)    All other components within normal limits  LIPASE, BLOOD  POC OCCULT BLOOD, ED    Imaging Review Dg Abd Acute W/chest  05/06/2016  CLINICAL DATA:  50 year old male with epigastric pain and vomiting EXAM: DG ABDOMEN ACUTE W/ 1V CHEST COMPARISON:  CT of Benjamin abdomen pelvis dated 09/25/2015 FINDINGS: Benjamin lungs are clear. There is no pleural effusion or pneumothorax. Benjamin cardiac silhouette is within normal limits. There is no bowel dilatation or evidence of obstruction. No free air or portal venous gas. No radiopaque calculi or foreign object. Benjamin osseous structures and Benjamin soft tissues appear unremarkable. Partially visualized right shoulder arthroplasty. IMPRESSION: Negative abdominal radiographs.  No acute cardiopulmonary disease. Electronically Signed   By: Anner Crete M.D.   On: 05/06/2016 22:56   I have personally reviewed and evaluated these images and lab results as part of my medical decision-making.   EKG Interpretation None     Review of labs show Benjamin patient is hypokalemic.  We will given IV fluids as well as IV potassium, due to difficulty swallowing solids.  I feel that this is an esophageal stricture that will need to be followed up by gastroenterology for possible dilatation.  Patient says he is feeling much better since he received IV fluids and potassium supplementation and a sense that he will need to follow-up with Benjamin gastroenterologist by phone tomorrow.  He is to follow  a liquid diet for Benjamin next several days until he can be seen in Benjamin office. MDM   Final diagnoses:  Dysphagia  Hypokalemia    I personally performed Benjamin services described in this documentation, which was scribed in my presence. Benjamin recorded information has been reviewed and is accurate.   [name], NP 05/07/16 [place], MD 05/07/16 [phone]

## 2016-05-06 NOTE — ED Notes (Signed)
Pt in X-Ray ?

## 2016-05-06 NOTE — ED Notes (Addendum)
Patient presents for epigastric abdominal pain x1 week. One episode of emesis and black stools. Patient also c/o weight loss approximately 10-15 lbs in 2-3 weeks. Denies fever, urinary symptoms.

## 2016-05-06 NOTE — ED Notes (Signed)
NP at bedside.

## 2016-05-07 NOTE — Discharge Instructions (Signed)
Today you were given IV fluids and potassium supplementation .  Please call your gastroenterologist to make an appointment for further evaluation.  I feel that she will have an esophageal stricture that is causing your discomfort, as you've had this in the past and have required dilatation. You have been given an outline for dysphagia diet level I, which includes liquid and pured foods.  Please try to follow this .  Avoid solid foods until your further evaluated   Dysphagia Swallowing problems (dysphagia) occur when solids and liquids seem to stick in your throat on the way down to your stomach, or the food takes longer to get to the stomach. Other symptoms include regurgitating food, noises coming from the throat, chest discomfort with swallowing, and a feeling of fullness or the feeling of something being stuck in your throat when swallowing. When blockage in your throat is complete, it may be associated with drooling. CAUSES  Problems with swallowing may occur because of problems with the muscles. The food cannot be propelled in the usual manner into your stomach. You may have ulcers, scar tissue, or inflammation in the tube down which food travels from your mouth to your stomach (esophagus), which blocks food from passing normally into the stomach. Causes of inflammation include:  Acid reflux from your stomach into your esophagus.  Infection.  Radiation treatment for cancer.  Medicines taken without enough fluids to wash them down into your stomach. You may have nerve problems that prevent signals from being sent to the muscles of your esophagus to contract and move your food down to your stomach. Globus pharyngeus is a relatively common problem in which there is a sense of an obstruction or difficulty in swallowing, without any physical abnormalities of the swallowing passages being found. This problem usually improves over time with reassurance and testing to rule out other  causes. DIAGNOSIS Dysphagia can be diagnosed and its cause can be determined by tests in which you swallow a white substance that helps illuminate the inside of your throat (contrast medium) while X-rays are taken. Sometimes a flexible telescope that is inserted down your throat (endoscopy) to look at your esophagus and stomach is used. TREATMENT   If the dysphagia is caused by acid reflux or infection, medicines may be used.  If the dysphagia is caused by problems with your swallowing muscles, swallowing therapy may be used to help you strengthen your swallowing muscles.  If the dysphagia is caused by a blockage or mass, procedures to remove the blockage may be done. HOME CARE INSTRUCTIONS  Try to eat soft food that is easier to swallow and check your weight on a daily basis to be sure that it is not decreasing.  Be sure to drink liquids when sitting upright (not lying down). SEEK MEDICAL CARE IF:  You are losing weight because you are unable to swallow.  You are coughing when you drink liquids (aspiration).  You are coughing up partially digested food. SEEK IMMEDIATE MEDICAL CARE IF:  You are unable to swallow your own saliva .  You are having shortness of breath or a fever, or both.  You have a hoarse voice along with difficulty swallowing. MAKE SURE YOU:  Understand these instructions.  Will watch your condition.  Will get help right away if you are not doing well or get worse.   This information is not intended to replace advice given to you by your health care provider. Make sure you discuss any questions you have with your  health care provider.   Document Released: 10/08/2000 Document Revised: 11/01/2014 Document Reviewed: 03/30/2013 Elsevier Interactive Patient Education 2016 Elsevier Inc.  Dysphagia Diet Level 1, Pureed The dysphasia level 1 diet includes foods that are completely pureed and smooth. The foods have a pudding-like texture, such as the texture of  pureed pancakes, mashed potatoes, and yogurt. The diet does not include foods with lumps or coarse textures. Liquids should be smooth and may either be thin, nectar-thick, honey-like, or spoon-thick. This diet is helpful for people with moderate to severe swallowing problems. It reduces the risk of food getting caught in the windpipe, trachea, or lungs. You may need help or supervision during meals while following this diet. WHAT DO I NEED TO KNOW ABOUT THIS DIET? Foods  You may eat foods that are soft and have a pudding-like texture. If a food does not have this texture, you may be able to eat the food after:  Pureeing it. This can be done with a blender or whisk.  Moistening it with liquid. For example, you may have bread if you soak it in milk or syrup.  Avoid foods that are hard, dry, sticky, chunky, lumpy, or stringy. Also avoid foods with nuts, seeds, raisins, skins, and pulp.  Do not eat foods that you have to chew. If you have to chew the food, then you cannot eat it.  Eat a variety of foods to get all the nutrients you need. Liquids  You may drink liquids that are smooth. Your health care provider will tell you if you should drink thin or thickened liquids.  To thicken a liquid, use a food and beverage thickener or a thickening food. Thickened liquids are usually a "pudding-like" consistency.  Thin liquids include fruit juices, milk, coffee, tea, yogurts, shakes, and similar foods that melt to thin liquid at room temperature.  Avoid liquids with seeds, pulp, or chunks. See your dietitian or health care provider regularly for help with your dietary changes. WHAT FOODS CAN I EAT? Grains Store-bought soft breads, pancakes, and [place] toast that have a smooth, moist texture and do not have nuts or seeds (you will need to moisten the food with liquid). Cooked cereals that have a pudding-like consistency, such as cream of wheat or farina (no oatmeal). Pureed, well-cooked pasta, rice,  and plain bread stuffing. Vegetables Pureed vegetables. Soft avocado. Smooth tomato paste or sauce. Strained or pureed soups (these may need to be thickened as directed). Mashed or pureed potatoes without skin (can be seasoned with butter, smooth gravy, margarine, or sour cream). Fruits Pureed fruits such as melons and apples without seeds or pulp. Mashed bananas. Smooth tomato paste or sauce. Fruit juices without pulp or seeds. Strained or pureed soups. Meat and Other Protein Sources Pureed meat. Smooth pate or liverwurst. Smooth souffles. Pureed beans (such as lentils). Pureed eggs. Dairy Yogurt. Smooth cheese sauces. Milk (may need to be thickened). Nutritional dairy drinks or shakes. Ask your health care provider whether you can have ice cream. Condiments Finely ground salt, pepper, and other ground spices. Sweets/Desserts Smooth puddings and custards. Pureed desserts. Souffles. Whipped topping. Ask your health care provider whether you can have frozen desserts. Fats and Oils Butter. Margarine. Smooth and strained gravy. Sour cream. Mayonnaise. Cream cheese. Whipped topping. Smooth sauces (such as white sauce, cheese sauce, or hollandaise sauce). The items listed above may not be a complete list of recommended foods or beverages. Contact your dietitian for more options. WHAT FOODS ARE NOT RECOMMENDED? Grains Oatmeal. Dry cereals.  Hard breads. Vegetables Whole vegetables. Stringy vegetables (such as celery). Thin tomato sauce. Fruits Whole fresh, frozen, canned, or dried fruits that have not been pureed. Stringy fruits (such as pineapple). Meat and Other Protein Sources Whole or ground meat, fish, or poultry. Dried or cooked lentils or legumes that have been cooked but not mashed or pureed. Non-pureed eggs. Nuts and seeds. Peanut butter. Dairy Non-pureed cheese. Dairy products with lumps or chunks. Ask your health care provider whether you can have ice cream. Condiments Coarse or  seeded herbs and spices. Sweets/Desserts [place] preserves. Jams with seeds. Solid desserts. Sticky, chewy sweets (such as licorice and caramel). Ask your health care provider whether you can have frozen desserts. Fats and Oils Sauces of fats with lumps or chunks. The items listed above may not be a complete list of foods and beverages to avoid. Contact your dietitian for more information.   This information is not intended to replace advice given to you by your health care provider. Make sure you discuss any questions you have with your health care provider.   Document Released: 10/11/2005 Document Revised: 11/01/2014 Document Reviewed: 09/24/2013 Elsevier Interactive Patient Education [employer].

## 2016-05-11 ENCOUNTER — Telehealth: Payer: Self-pay | Admitting: Gastroenterology

## 2016-05-11 NOTE — Telephone Encounter (Signed)
He has been back in the ER due to dysphagia. He says it feels like it is time for another dilation. Please advise.

## 2016-05-12 ENCOUNTER — Other Ambulatory Visit: Payer: Self-pay

## 2016-05-12 ENCOUNTER — Telehealth: Payer: Self-pay | Admitting: *Deleted

## 2016-05-12 NOTE — Telephone Encounter (Signed)
Ok to schedule for EGD at [facility]

## 2016-05-12 NOTE — Telephone Encounter (Signed)
Spoke with pt and PV made for 05-13-16 at 1400.  Told to check in on third floor and they will send him to fourth floor.  Understanding voiced

## 2016-05-12 NOTE — Telephone Encounter (Signed)
Spoke briefly with the patient.  Appointment in [place] this Friday. He will confirm this afternoon.  Advised the patient I need him to come by and sign his consent form and pick up the instructions.

## 2016-05-13 ENCOUNTER — Ambulatory Visit (AMBULATORY_SURGERY_CENTER): Payer: Self-pay | Admitting: *Deleted

## 2016-05-13 VITALS — Ht 74.0 in | Wt 224.0 lb

## 2016-05-13 DIAGNOSIS — R1310 Dysphagia, unspecified: Secondary | ICD-10-CM

## 2016-05-13 NOTE — Progress Notes (Signed)
No egg or soy allergy known to patient  No issues with past sedation with any surgeries  or procedures, no intubation problems  No diet pills per patient No home 02 use per patient  No blood thinners per patient  Pt denies issues with constipation  Pt states 10-2015 propofol burned going in a lot

## 2016-05-14 ENCOUNTER — Ambulatory Visit (AMBULATORY_SURGERY_CENTER): Payer: Medicaid Other | Admitting: Gastroenterology

## 2016-05-14 ENCOUNTER — Encounter: Payer: Self-pay | Admitting: Gastroenterology

## 2016-05-14 VITALS — BP 114/81 | HR 78 | Temp 97.5°F | Resp 28 | Ht 73.5 in | Wt 224.0 lb

## 2016-05-14 DIAGNOSIS — R1310 Dysphagia, unspecified: Secondary | ICD-10-CM

## 2016-05-14 DIAGNOSIS — K222 Esophageal obstruction: Secondary | ICD-10-CM | POA: Diagnosis not present

## 2016-05-14 MED ORDER — SODIUM CHLORIDE 0.9 % IV SOLN: 500.0000 mL | INTRAVENOUS | Status: DC

## 2016-05-14 NOTE — Patient Instructions (Signed)
YOU HAD AN ENDOSCOPIC PROCEDURE TODAY AT [facility]:   Refer to the procedure report that was given to you for any specific questions about what was found during the examination.  If the procedure report does not answer your questions, please call your gastroenterologist to clarify.  If you requested that your care partner not be given the details of your procedure findings, then the procedure report has been included in a sealed envelope for you to review at your convenience later.  YOU SHOULD EXPECT: Some feelings of bloating in the abdomen. Passage of more gas than usual.  Walking can help get rid of the air that was put into your GI tract during the procedure and reduce the bloating. If you had a lower endoscopy (such as a colonoscopy or flexible sigmoidoscopy) you may notice spotting of blood in your stool or on the toilet paper. If you underwent a bowel prep for your procedure, you may not have a normal bowel movement for a few days.  Please Note:  You might notice some irritation and congestion in your nose or some drainage.  This is from the oxygen used during your procedure.  There is no need for concern and it should clear up in a day or so.  SYMPTOMS TO REPORT IMMEDIATELY:    Following upper endoscopy (EGD)  Vomiting of blood or coffee ground material  New chest pain or pain under the shoulder blades  Painful or persistently difficult swallowing  New shortness of breath  Fever of 100F or higher  Black, tarry-looking stools  For urgent or emergent issues, a gastroenterologist can be reached at any hour by calling [phone].   DIET: Your first meal following the procedure should be a small meal and then it is ok to progress to your normal diet. Heavy or fried foods are harder to digest and may make you feel nauseous or bloated.  Likewise, meals heavy in dairy and vegetables can increase bloating.  Drink plenty of fluids but you should avoid alcoholic beverages  for 24 hours.  ACTIVITY:  You should plan to take it easy for the rest of today and you should NOT DRIVE or use heavy machinery until tomorrow (because of the sedation medicines used during the test).    FOLLOW UP: Our staff will call the number listed on your records the next business day following your procedure to check on you and address any questions or concerns that you may have regarding the information given to you following your procedure. If we do not reach you, we will leave a message.  However, if you are feeling well and you are not experiencing any problems, there is no need to return our call.  We will assume that you have returned to your regular daily activities without incident.  If any biopsies were taken you will be contacted by phone or by letter within the next 1-3 weeks.  Please call us at [phone] if you have not heard about the biopsies in 3 weeks.    SIGNATURES/CONFIDENTIALITY: You and/or your care partner have signed paperwork which will be entered into your electronic medical record.  These signatures attest to the fact that that the information above on your After Visit Summary has been reviewed and is understood.  Full responsibility of the confidentiality of this discharge information lies with you and/or your care-partner.  Stenosis information given, .  No aspirin, ibuprofen, naproxen, or other non steroidal anti-inflammatory drugs.,

## 2016-05-14 NOTE — Op Note (Signed)
[facility] Patient Name: Benjamin Herrera Procedure Date: 05/14/2016 3:17 PM MRN: [id number] Endoscopist: Mauri Pole , MD Age: 50 Referring MD:  Date of Birth: [birth year] Gender: Male Account #: [id number] Procedure:                Upper GI endoscopy Indications:              Dysphagia Medicines:                Monitored Anesthesia Care Procedure:                Pre-Anesthesia Assessment:                           - Prior to the procedure, a History and Physical                            was performed, and patient medications and                            allergies were reviewed. The patient's tolerance of                            previous anesthesia was also reviewed. The risks                            and benefits of the procedure and the sedation                            options and risks were discussed with the patient.                            All questions were answered, and informed consent                            was obtained. Prior Anticoagulants: The patient has                            taken no previous anticoagulant or antiplatelet                            agents. ASA Grade Assessment: III - A patient with                            severe systemic disease. After reviewing the risks                            and benefits, the patient was deemed in                            satisfactory condition to undergo the procedure.                           After obtaining informed consent, the endoscope was  passed under direct vision. Throughout the                            procedure, the patient's blood pressure, pulse, and                            oxygen saturations were monitored continuously. The                            Model GIF-HQ190 [id number]) scope was introduced                            through the mouth, and advanced to the second part                            of duodenum. The upper GI endoscopy was                             accomplished without difficulty. The patient                            tolerated the procedure well. Findings:                 One moderate (circumferential scarring or stenosis;                            an endoscope may pass) benign-appearing, intrinsic                            stenosis was found 38 to 40 cm from the incisors.                            This measured ~1.6 cm (inner diameter) x 2 cm (in                            length) and was traversed. A TTS dilator was passed                            through the scope. Dilation with an 18-19-20 mm                            balloon dilator was performed to 20 mm. The                            dilation site was examined following endoscope                            reinsertion and showed moderate improvement in                            luminal narrowing. Estimated blood loss: none.  The stomach was normal.                           The examined duodenum was normal. Complications:            No immediate complications. Estimated Blood Loss:     Estimated blood loss: none. Impression:               - Esophageal stenosis. Dilated.                           - Normal stomach.                           - Normal examined duodenum.                           - No specimens collected. Recommendation:           - Patient has a contact number available for                            emergencies. The signs and symptoms of potential                            delayed complications were discussed with the                            patient. Return to normal activities tomorrow.                            Written discharge instructions were provided to the                            patient.                           - Resume previous diet.                           - Continue present medications.                           - No aspirin, ibuprofen, naproxen, or other                             non-steroidal anti-inflammatory drugs.                           - Repeat upper endoscopy PRN for retreatment.                           - Return to GI clinic PRN. Mauri Pole, MD 05/14/2016 3:38:10 PM This report has been signed electronically.

## 2016-05-14 NOTE — Progress Notes (Signed)
Stable to RR 

## 2016-05-14 NOTE — Progress Notes (Signed)
Called to room to assist during endoscopic procedure.  Patient ID and intended procedure confirmed with present staff. Received instructions for my participation in the procedure from the performing physician.  

## 2016-05-17 ENCOUNTER — Telehealth: Payer: Self-pay

## 2016-05-17 NOTE — Telephone Encounter (Signed)
  Follow up Call-  Call back number 05/14/2016  Post procedure Call Back phone  # [phone]  Permission to leave phone message Yes  Some recent data might be hidden     Patient questions:  Do you have a fever, pain , or abdominal swelling? No. Pain Score  0 *  Have you tolerated food without any problems? Yes.    Have you been able to return to your normal activities? Yes.    Do you have any questions about your discharge instructions: Diet   No. Medications  No. Follow up visit  No.  Do you have questions or concerns about your Care? No.  Actions: * If pain score is 4 or above: No action needed, pain <4.

## 2016-07-14 ENCOUNTER — Other Ambulatory Visit: Payer: Medicaid Other

## 2016-07-14 DIAGNOSIS — B20 Human immunodeficiency virus [HIV] disease: Secondary | ICD-10-CM

## 2016-07-15 LAB — COMPLETE METABOLIC PANEL WITHOUT GFR
ALT: 13 U/L (ref 9–46)
AST: 24 U/L (ref 10–35)
Albumin: 3.9 g/dL (ref 3.6–5.1)
Alkaline Phosphatase: 76 U/L (ref 40–115)
BUN: 9 mg/dL (ref 7–25)
CO2: 24 mmol/L (ref 20–31)
Calcium: 8.9 mg/dL (ref 8.6–10.3)
Chloride: 102 mmol/L (ref 98–110)
Creat: 0.96 mg/dL (ref 0.70–1.33)
GFR, Est African American: >89 mL/min
GFR, Est Non African American: >89 mL/min
Glucose, Bld: 92 mg/dL (ref 65–99)
Potassium: 3.7 mmol/L (ref 3.5–5.3)
Sodium: 139 mmol/L (ref 135–146)
Total Bilirubin: 0.4 mg/dL (ref 0.2–1.2)
Total Protein: 6.6 g/dL (ref 6.1–8.1)

## 2016-07-15 LAB — CBC WITH DIFFERENTIAL/PLATELET
Basophils Absolute: 62 {cells}/uL (ref 0–200)
Basophils Relative: 1 %
Eosinophils Absolute: 248 {cells}/uL (ref 15–500)
Eosinophils Relative: 4 %
HCT: 38.3 % — ABNORMAL LOW (ref 38.5–50.0)
Hemoglobin: 12.4 g/dL — ABNORMAL LOW (ref 13.2–17.1)
Lymphocytes Relative: 42 %
Lymphs Abs: 2604 {cells}/uL (ref 850–3900)
MCH: 25.9 pg — ABNORMAL LOW (ref 27.0–33.0)
MCHC: 32.4 g/dL (ref 32.0–36.0)
MCV: 80.1 fL (ref 80.0–100.0)
MPV: 9.1 fL (ref 7.5–12.5)
Monocytes Absolute: 372 {cells}/uL (ref 200–950)
Monocytes Relative: 6 %
Neutro Abs: 2914 {cells}/uL (ref 1500–7800)
Neutrophils Relative %: 47 %
Platelets: 223 10*3/uL (ref 140–400)
RBC: 4.78 MIL/uL (ref 4.20–5.80)
RDW: 17 % — ABNORMAL HIGH (ref 11.0–15.0)
WBC: 6.2 10*3/uL (ref 3.8–10.8)

## 2016-07-15 LAB — T-HELPER CELL (CD4) - (RCID CLINIC ONLY)
CD4 T CELL HELPER: 14 % — AB (ref 33–55)
CD4 T Cell Abs: 330 /uL — ABNORMAL LOW (ref 400–2700)

## 2016-07-16 LAB — HIV-1 RNA QUANT-NO REFLEX-BLD: HIV-1 RNA Quant, Log: <1.3 Log copies/mL (ref <1.30)

## 2016-07-28 ENCOUNTER — Ambulatory Visit (INDEPENDENT_AMBULATORY_CARE_PROVIDER_SITE_OTHER): Payer: Medicaid Other | Admitting: Infectious Diseases

## 2016-07-28 ENCOUNTER — Encounter: Payer: Self-pay | Admitting: Infectious Diseases

## 2016-07-28 VITALS — BP 155/80 | HR 69 | Temp 98.1°F | Ht 73.0 in | Wt 226.0 lb

## 2016-07-28 DIAGNOSIS — M545 Low back pain, unspecified: Secondary | ICD-10-CM

## 2016-07-28 DIAGNOSIS — Z113 Encounter for screening for infections with a predominantly sexual mode of transmission: Secondary | ICD-10-CM

## 2016-07-28 DIAGNOSIS — S42201D Unspecified fracture of upper end of right humerus, subsequent encounter for fracture with routine healing: Secondary | ICD-10-CM

## 2016-07-28 DIAGNOSIS — B20 Human immunodeficiency virus [HIV] disease: Secondary | ICD-10-CM

## 2016-07-28 DIAGNOSIS — Z79899 Other long term (current) drug therapy: Secondary | ICD-10-CM | POA: Diagnosis not present

## 2016-07-28 DIAGNOSIS — G8929 Other chronic pain: Secondary | ICD-10-CM | POA: Diagnosis not present

## 2016-07-28 DIAGNOSIS — A630 Anogenital (venereal) warts: Secondary | ICD-10-CM

## 2016-07-28 MED ORDER — CYCLOBENZAPRINE HCL 5 MG PO TABS: 5.0000 mg | ORAL_TABLET | Freq: Three times a day (TID) | ORAL | Status: DC | PRN

## 2016-07-28 NOTE — Assessment & Plan Note (Signed)
Doing well  Refuses flu shot Given condoms Will see back in 6 months with labs.

## 2016-07-28 NOTE — Assessment & Plan Note (Signed)
Will have him seen by hand for brace. Still quite painful.

## 2016-07-28 NOTE — Assessment & Plan Note (Signed)
Will f/u at his next appt

## 2016-07-28 NOTE — Assessment & Plan Note (Signed)
Given rx for flexeril.

## 2016-07-28 NOTE — Progress Notes (Signed)
   Subjective:    Patient ID: Benjamin Herrera, male    DOB: [birth year], 50 y.o.   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 R shoulder pain (previous rod in his shoulder).  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 Was taking TRV/ISN, had DRVr added at his visit 06-09-12. Then changed to tivicay and complera. At his f/u 09-2015 was changed to genvoya/darunavir due to need for ppi.  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  His stomach has not been an issue lately.   HIV 1 RNA Quant (copies/mL)  Date Value  07/14/2016 <20  01/07/2016 <20  03/17/2015 24 (H)   CD4 T Cell Abs (/uL)  Date Value  07/14/2016 330 (L)  01/07/2016 360 (L)  03/17/2015 360 (L)   States he was rushed to hospital recently for low potassium level. His repeat on 9-20 is 3.7.   Review of Systems  Constitutional: Negative for appetite change and unexpected weight change.  Gastrointestinal: Negative for constipation and diarrhea.  Genitourinary: Negative for difficulty urinating.  Musculoskeletal: Positive for arthralgias.  wants muscle relaxer or tramadol.      Objective:   Physical Exam  Constitutional: He appears well-developed and well-nourished.  HENT:  Mouth/Throat: No oropharyngeal exudate.  Eyes: EOM are normal. Pupils are equal, round, and reactive to light.  Neck: Neck supple.  Cardiovascular: Normal rate, regular rhythm and normal heart sounds.   Pulmonary/Chest: Effort normal and breath sounds normal.  Abdominal: Soft. Bowel sounds are normal. There is no tenderness. There is no rebound.  Musculoskeletal:       Arms: Lymphadenopathy:    He has no cervical adenopathy.      Assessment & Plan:

## 2016-08-03 ENCOUNTER — Other Ambulatory Visit: Payer: Self-pay | Admitting: Infectious Diseases

## 2016-08-03 ENCOUNTER — Telehealth: Payer: Self-pay | Admitting: *Deleted

## 2016-08-03 DIAGNOSIS — M791 Myalgia, unspecified site: Secondary | ICD-10-CM

## 2016-08-03 MED ORDER — CYCLOBENZAPRINE HCL 5 MG PO TABS: 5.0000 mg | ORAL_TABLET | Freq: Three times a day (TID) | ORAL | 0 refills | Status: DC | PRN

## 2016-08-03 NOTE — Telephone Encounter (Signed)
Patient notified and he already talked to Diane and she will reach out to him in a few days. Once she hears back on referral. Myrtis Hopping

## 2016-08-03 NOTE — Telephone Encounter (Signed)
Thanks for sending to Diane  I had put in the flexeril as clinic admin, I have corrected this

## 2016-08-03 NOTE — Telephone Encounter (Signed)
Patient called stating when he last saw Dr. Johnnye Sima he was suppose to receive an Rx for flexeril. Please advise, he also asked about a referral to the hand surgeon and if ok, I will forward to [facility], referral specialist.

## 2016-08-05 ENCOUNTER — Encounter: Payer: Self-pay | Admitting: Lab

## 2016-10-06 ENCOUNTER — Encounter (HOSPITAL_COMMUNITY): Payer: Self-pay | Admitting: Emergency Medicine

## 2016-10-06 ENCOUNTER — Emergency Department (HOSPITAL_COMMUNITY): Payer: Medicaid Other

## 2016-10-06 ENCOUNTER — Other Ambulatory Visit: Payer: Self-pay | Admitting: Infectious Diseases

## 2016-10-06 ENCOUNTER — Emergency Department (HOSPITAL_COMMUNITY)
Admission: EM | Admit: 2016-10-06 | Discharge: 2016-10-07 | Disposition: A | Discharge status: Home / Self Care | Payer: Medicaid Other | Attending: Emergency Medicine | Admitting: Emergency Medicine

## 2016-10-06 DIAGNOSIS — J45909 Unspecified asthma, uncomplicated: Secondary | ICD-10-CM | POA: Insufficient documentation

## 2016-10-06 DIAGNOSIS — I10 Essential (primary) hypertension: Secondary | ICD-10-CM | POA: Diagnosis not present

## 2016-10-06 DIAGNOSIS — M545 Low back pain: Secondary | ICD-10-CM

## 2016-10-06 DIAGNOSIS — Z79899 Other long term (current) drug therapy: Secondary | ICD-10-CM | POA: Insufficient documentation

## 2016-10-06 DIAGNOSIS — Z87891 Personal history of nicotine dependence: Secondary | ICD-10-CM | POA: Insufficient documentation

## 2016-10-06 DIAGNOSIS — R079 Chest pain, unspecified: Secondary | ICD-10-CM | POA: Diagnosis present

## 2016-10-06 LAB — BASIC METABOLIC PANEL
ANION GAP: 6 (ref 5–15)
BUN: 9 mg/dL (ref 6–20)
CO2: 27 mmol/L (ref 22–32)
Calcium: 8.9 mg/dL (ref 8.9–10.3)
Chloride: 107 mmol/L (ref 101–111)
Creatinine, Ser: 1.22 mg/dL (ref 0.61–1.24)
GFR calc Af Amer: >60 mL/min (ref >60)
Glucose, Bld: 159 mg/dL — ABNORMAL HIGH (ref 65–99)
Potassium: 3.8 mmol/L (ref 3.5–5.1)
SODIUM: 140 mmol/L (ref 135–145)

## 2016-10-06 LAB — CBC
HCT: 41.6 % (ref 39.0–52.0)
Hemoglobin: 13.7 g/dL (ref 13.0–17.0)
MCH: 26.3 pg (ref 26.0–34.0)
MCHC: 32.9 g/dL (ref 30.0–36.0)
MCV: 79.8 fL (ref 78.0–100.0)
Platelets: 201 10*3/uL (ref 150–400)
RBC: 5.21 MIL/uL (ref 4.22–5.81)
RDW: 14.7 % (ref 11.5–15.5)
WBC: 6 10*3/uL (ref 4.0–10.5)

## 2016-10-06 LAB — I-STAT TROPONIN, ED
TROPONIN I, POC: 0.06 ng/mL (ref 0.00–0.08)
TROPONIN I, POC: 0.05 ng/mL (ref 0.00–0.08)

## 2016-10-06 MED ORDER — GI COCKTAIL ~~LOC~~
30.0000 mL | Freq: Once | ORAL | Status: AC
  Administered 2016-10-06: 30 mL via ORAL
  Filled 2016-10-06: qty 30

## 2016-10-06 MED ORDER — PANTOPRAZOLE SODIUM 20 MG PO TBEC: 20.0000 mg | DELAYED_RELEASE_TABLET | Freq: Every day | ORAL | 0 refills | Status: DC

## 2016-10-06 MED ORDER — SUCRALFATE 1 G PO TABS: 1.0000 g | ORAL_TABLET | Freq: Three times a day (TID) | ORAL | 0 refills | Status: DC

## 2016-10-06 NOTE — ED Notes (Signed)
Pt

## 2016-10-06 NOTE — ED Provider Notes (Signed)
[facility] DEPT Provider Note   CSN: [id number] Arrival date & time: 10/06/16  1516  By signing my name below, I, Judithe Modest, attest that this documentation has been prepared under the direction and in the presence of [employer], PA-C. Electronically Signed: Judithe Modest, ER Scribe. 06/05/2016. 8:42 PM.   History   Chief Complaint Chief Complaint  Patient presents with  . Chest Pain    HPI  HPI Comments: Benjamin Herrera is a 50 y.o. male who presents to the Emergency Department complaining of three days of intermittent worsening sharp CP with associated diaphoresis and lightheadedness that radiates to his jaw. No radiation to the back. His pain is not associated with exertion. Belching helps his pain. No medications taken PTA for symptoms. He has a PMHx of achalasia. He smoked a cigarette two days ago for the first time in five months which may have exacerbated his sx. He smoked 1/2 PPD for many years but quit 5 months ago. His pain usually lasts for 1/2 hour and then resolves. He denies leg swelling, syncope, fever, vomiting, and shortness of breath. He has never seen a cardiologist. His past visit with his PCP was several months ago. His mother had an MI at 20.    Past Medical History:  Diagnosis Date  . Achalasia and cardiospasm   . Allergy   . Arthritis    back ,shoulders   . Asthma   . Boils    under arms hx of  . Candidiasis of mouth   . Candidiasis of unspecified site   . Condyloma acuminatum in male of scrotum & anal canal s/p laser ablation 07/03/2012  . Dysphagia   . Fracture, humerus   . GERD (gastroesophageal reflux disease)   . Hemorrhoids   . Hepatitis 1993   A  . Herpes labialis   . HIV (human immunodeficiency virus infection) ([place]) [place]  . Hypertension Dx 2014   off all bp meds for last 9 or 10 months  . Pilonidal disease 01/10/2012  . Polysubstance abuse   . Psoriasis    hx of  . Sleep apnea    does  have CPAP  . 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 Dysphagia   . GERD (gastroesophageal reflux disease) 01/10/2015  . Chronic low back pain 10/07/2014  . Syncope and collapse 10/07/2014  . Mild sleep apnea 10/07/2014  . 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  . Numbness and tingling in left hand 02/12/2013  . Myalgia 02/12/2013  . Internal hemorrhoids with bleeding 01/30/2013  . 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 Condyloma acuminatum in male of scrotum & anal canal s/p laser ablation 07/03/2012  . HTN (hypertension) 08/31/2010  . PSORIASIS 11/20/2009  . Achalasia and cardiospasm 08/14/2009  . OTHER CANDIDIASIS OF OTHER SPECIFIED SITES 06/26/2009  . Marijuana use 05/16/2008  . ASTHMA, UNSPECIFIED, UNSPECIFIED STATUS 05/16/2008  . HIV disease ([place]) 09/03/2006  . Closed fracture of part of humerus 09/03/2006    Past Surgical History:  Procedure Laterality Date  . BALLOON DILATION N/A 11/11/2015   Procedure: BALLOON DILATION;  Surgeon: Mauri Pole, MD;  Location: [facility];  Service: Endoscopy;  Laterality: N/A;  . BOTOX INJECTION  10/11/2012   Procedure:  BOTOX INJECTION;  Surgeon: Inda Castle, MD;  Location: [facility];  Service: Endoscopy;  Laterality: N/A;  . BOTOX INJECTION N/A 01/25/2013   Procedure: BOTOX INJECTION;  Surgeon: Inda Castle, MD;  Location: [facility];  Service: Endoscopy;  Laterality: N/A;  . BOTOX INJECTION N/A 06/21/2013   Procedure: BOTOX INJECTION;  Surgeon: Inda Castle, MD;  Location: [facility];  Service: Endoscopy;  Laterality: N/A;  . BOTOX INJECTION N/A 10/08/2013   Procedure: BOTOX INJECTION;  Surgeon: Inda Castle, MD;  Location: [facility];  Service: Endoscopy;  Laterality: N/A;  . BOTOX INJECTION N/A  06/16/2015   Procedure: BOTOX INJECTION;  Surgeon: Inda Castle, MD;  Location: [facility];  Service: Endoscopy;  Laterality: N/A;  . COLONOSCOPY  05/19/2012   Procedure: COLONOSCOPY;  Surgeon: Inda Castle, MD;  Location: [facility];  Service: Endoscopy;  Laterality: N/A;  jill trying to contact pt to come in 0830 for 930 case, phone not accepting messages  . COLONOSCOPY    . ESOPHAGEAL MANOMETRY  05/29/2012   Procedure: ESOPHAGEAL MANOMETRY (EM);  Surgeon: Inda Castle, MD;  Location: [facility];  Service: Endoscopy;  Laterality: N/A;  . ESOPHAGEAL MANOMETRY N/A 10/06/2015   Procedure: ESOPHAGEAL MANOMETRY (EM);  Surgeon: Mauri Pole, MD;  Location: [facility];  Service: Endoscopy;  Laterality: N/A;  . ESOPHAGOGASTRODUODENOSCOPY  11/11/2011   Procedure: ESOPHAGOGASTRODUODENOSCOPY (EGD);  Surgeon: Inda Castle, MD;  Location: [facility];  Service: Endoscopy;  Laterality: N/A;  botox injection  called Pt to change time of procedure per Dr Deatra Ina  . ESOPHAGOGASTRODUODENOSCOPY  03/10/2012   Procedure: ESOPHAGOGASTRODUODENOSCOPY (EGD);  Surgeon: Inda Castle, MD;  Location: [facility];  Service: Endoscopy;  Laterality: N/A;  . ESOPHAGOGASTRODUODENOSCOPY  10/11/2012   Procedure: ESOPHAGOGASTRODUODENOSCOPY (EGD);  Surgeon: Inda Castle, MD;  Location: [facility];  Service: Endoscopy;  Laterality: N/A;  . ESOPHAGOGASTRODUODENOSCOPY N/A 01/25/2013   Procedure: ESOPHAGOGASTRODUODENOSCOPY (EGD);  Surgeon: Inda Castle, MD;  Location: [facility];  Service: Endoscopy;  Laterality: N/A;  . ESOPHAGOGASTRODUODENOSCOPY N/A 06/21/2013   Procedure: ESOPHAGOGASTRODUODENOSCOPY (EGD);  Surgeon: Inda Castle, MD;  Location: [facility];  Service: Endoscopy;  Laterality: N/A;  . ESOPHAGOGASTRODUODENOSCOPY (EGD) WITH PROPOFOL N/A 10/08/2013   Procedure: ESOPHAGOGASTRODUODENOSCOPY (EGD) WITH PROPOFOL;  Surgeon: Inda Castle, MD;  [facility]: [facility];  Service: Endoscopy;   Laterality: N/A;  . ESOPHAGOGASTRODUODENOSCOPY (EGD) WITH PROPOFOL N/A 06/16/2015   Procedure: ESOPHAGOGASTRODUODENOSCOPY (EGD) WITH PROPOFOL;  Surgeon: Inda Castle, MD;  Location: [facility];  Service: Endoscopy;  Laterality: N/A;  . ESOPHAGOGASTRODUODENOSCOPY (EGD) WITH PROPOFOL N/A 11/11/2015   Procedure: ESOPHAGOGASTRODUODENOSCOPY (EGD) WITH PROPOFOL;  Surgeon: Mauri Pole, MD;  Location: [facility];  Service: Endoscopy;  Laterality: N/A;  . EXAMINATION UNDER ANESTHESIA  07/24/2012   Procedure: EXAM UNDER ANESTHESIA;  Surgeon: Adin Hector, MD;  Location: [facility];  Service: General;  Laterality: N/A;  . humeral fracture surgery Right yrs ago  . PILONIDAL CYST EXCISION  07/24/2012   Procedure: CYST EXCISION PILONIDAL SIMPLE;  Surgeon: Adin Hector, MD;  Location: [facility];  Service: General;  Laterality: N/A;  Exam Under Anesthesia,, Excision Pilonidal Disease,   . right shoulder replacement  2008   x 2  . UPPER GASTROINTESTINAL ENDOSCOPY    . WART FULGURATION  07/24/2012   Procedure: FULGURATION ANAL WART;  Surgeon: Adin Hector, MD;  Location: [facility];  Service: General;  Laterality: N/A;  excision of raphe mass     Home Medications  Prior to Admission medications   Medication Sig Start Date End Date Taking? Authorizing Provider  cyclobenzaprine (FLEXERIL) 5 MG tablet Take 1 tablet (5 mg total) by mouth 3 (three) times daily as needed for muscle spasms. 08/03/16   Campbell Riches, MD  Darunavir Ethanolate (PREZISTA) 800 MG tablet Take 1 tablet (800 mg total) by mouth daily with breakfast. Patient taking differently: Take 800 mg by mouth every evening.  10/08/15   Campbell Riches, MD  elvitegravir-cobicistat-emtricitabine-tenofovir (GENVOYA) 150-150-200-10 MG TABS tablet Take 1 tablet by mouth daily with breakfast. Patient taking differently: Take 1 tablet by mouth every evening.  10/08/15   Campbell Riches, MD  Naphazoline-Pheniramine (OPCON-A OP) Apply 1-2  drops to eye 2 (two) times daily as needed (dry eyes.).     Historical Provider, MD  omeprazole (PRILOSEC) 40 MG capsule Take 1 capsule (40 mg total) by mouth daily before breakfast. 01/07/16   Mauri Pole, MD  VOLTAREN 1 % GEL APPLY 2 G TOPICALLY 2 (TWO) TIMES DAILY. 10/15/15   Campbell Riches, MD    Family History Family History  Problem Relation Age of Onset  . Arthritis Mother   . Hypertension Mother   . Heart disease Mother   . Diabetes Maternal Grandmother   . Stomach cancer Paternal Grandmother   . Colon cancer Neg Hx   . Colon polyps Neg Hx   . Esophageal cancer Neg Hx   . Rectal cancer Neg Hx     Social History Social History  Substance Use Topics  . Smoking status: Former Smoker    Packs/day: 0.50    Years: 25.00    Types: Cigarettes    Quit date: 11/26/2015  . Smokeless tobacco: Never Used     Comment: not ready  . Alcohol use No     Allergies   Patient has no known allergies.   Review of Systems Review of Systems A complete 10 system review of systems was obtained and all systems are negative except as noted in the HPI and PMH.    Physical Exam Updated Vital Signs BP 144/87 (BP Location: Left Arm)   Pulse 87   Temp 98.6 F (37 C) (Oral)   Resp 18   Wt 224 lb 4 oz (101.7 kg)   SpO2 97%   BMI 29.59 kg/m   Physical Exam  Constitutional: He is oriented to person, place, and time. He appears well-developed and well-nourished. No distress.  Nontoxic and in NAD  HENT:  Head: Normocephalic and atraumatic.  Eyes: Conjunctivae and EOM are normal. No scleral icterus.  Neck: Normal range of motion.  No JVD  Cardiovascular: Normal rate, regular rhythm and intact distal pulses.   Pulmonary/Chest: Effort normal. No respiratory distress. He has no wheezes. He has no rales.  Lungs CTAB. Respirations even and unlabored  Musculoskeletal: Normal range of motion.  No lower extremity pitting edema.  Neurological: He is alert and oriented to person,  place, and time. He exhibits normal muscle tone. Coordination normal.  GCS 15. Patient moving all extremities.  Skin: Skin is warm and dry. No rash noted. He is not diaphoretic. No erythema. No pallor.  Psychiatric: He has a normal mood and affect. His behavior is normal.  Nursing note and vitals reviewed.    ED Treatments / Results  Labs (all labs ordered are listed, but only abnormal results are displayed) Labs Reviewed  BASIC METABOLIC PANEL - Abnormal; Notable for the following:       Result Value  Glucose, Bld 159 (*)    All other components within normal limits  CBC  I-STAT TROPOININ, ED  I-STAT TROPOININ, ED    EKG  EKG Interpretation  Date/Time:  Wednesday October 06 2016 15:24:15 EST Ventricular Rate:  75 PR Interval:    QRS Duration: 95 QT Interval:  368 QTC Calculation: 411 R Axis:   82 Text Interpretation:  Sinus rhythm No significant change since last tracing Confirmed by Winfred Leeds  MD, SAM [phone]) on 10/06/2016 3:27:01 PM       Radiology Dg Chest 2 View  Result Date: 10/06/2016 CLINICAL DATA:  Retrosternal chest pain radiating into the jaw all with tingling in the left arm for the past 3 days. History of asthma, former smoker, gastroesophageal reflux. EXAM: CHEST  2 VIEW COMPARISON:  PA and lateral chest x-ray of July 02, 2011 and PA chest x-ray of May 06, 2016 FINDINGS: The lungs are adequately inflated. The interstitial markings are coarse bilaterally but are stable. There is no alveolar infiltrate. There is no pleural effusion or pneumothorax. The heart and pulmonary vascularity are normal. There is calcification in the wall of the aortic arch. The observed bony thorax exhibits no acute abnormality. 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 IMPRESSION: Mild chronic bronchitic-smoking related changes. No pneumonia, CHF, nor other acute cardiopulmonary abnormality. Thoracic aortic atherosclerosis. Electronically Signed   By: David  Martinique M.D.   On:  10/06/2016 17:10    Procedures Procedures (including critical care time)  Medications Ordered in ED Medications - No data to display   Initial Impression / Assessment and Plan / ED Course  I have reviewed the triage vital signs and the nursing notes.  Pertinent labs & imaging results that were available during my care of the patient were reviewed by me and considered in my medical decision making (see chart for details).  Clinical Course     50 y/o male presents to the ED for evaluation of intermittent chest pain x 3 days. Chest pain improves with belching and is not worsened by exertion. Symptoms are not associated with SOB. Hx of achalasia. Patient with reassuring EKG; no STEMI. Troponin negative x 2. Heart score is low-moderate, varying based on suspicion and risk factors. CXR without acute cardiopulmonary process. No mediastinal widening to suggest dissection. No focal consolidation, PNA, PTX, or pleural effusion. Doubt PE given lack of tachycardia, tachypnea, dyspnea, or hypoxia; symptoms also atypical for this. Given reassuring work up in the ED, I have advised outpatient cardiology follow up for stress testing. Return precautions discussed and provided. Patient discharged in satisfactory condition with no unaddressed concerns.   Final Clinical Impressions(s) / ED Diagnoses   Final diagnoses:  Nonspecific chest pain    New Prescriptions New Prescriptions   No medications on file    I personally performed the services described in this documentation, which was scribed in my presence. The recorded information has been reviewed and is accurate.          Antonietta Breach, PA-C 10/07/16 [place] Liu, MD 10/07/16 [phone]

## 2016-10-06 NOTE — ED Triage Notes (Signed)
Per pt, states chest pain for a few days-states radiated down left arm and into "jaws"-no cardiac history

## 2016-10-06 NOTE — ED Notes (Addendum)
Notified EDP,Liu,MD. Pt. I-stat troponin results 0.05 and RN,Rachel made aware.

## 2016-10-06 NOTE — ED Notes (Signed)
Pt refused to undress, he stated the most he will do is "put the gown over his shirt, he is not taking his shirt off."

## 2016-10-08 ENCOUNTER — Encounter (HOSPITAL_COMMUNITY): Payer: Self-pay

## 2016-10-08 ENCOUNTER — Inpatient Hospital Stay (HOSPITAL_COMMUNITY)
Admission: EM | Admit: 2016-10-08 | Discharge: 2016-10-12 | DRG: 247 | Disposition: A | Discharge status: Home / Self Care | Payer: Medicaid Other | Attending: Internal Medicine | Admitting: Internal Medicine

## 2016-10-08 ENCOUNTER — Emergency Department (HOSPITAL_COMMUNITY): Payer: Medicaid Other

## 2016-10-08 ENCOUNTER — Telehealth: Payer: Self-pay | Admitting: Gastroenterology

## 2016-10-08 DIAGNOSIS — F1290 Cannabis use, unspecified, uncomplicated: Secondary | ICD-10-CM | POA: Diagnosis present

## 2016-10-08 DIAGNOSIS — B20 Human immunodeficiency virus [HIV] disease: Secondary | ICD-10-CM | POA: Diagnosis present

## 2016-10-08 DIAGNOSIS — N179 Acute kidney failure, unspecified: Secondary | ICD-10-CM

## 2016-10-08 DIAGNOSIS — E860 Dehydration: Secondary | ICD-10-CM | POA: Diagnosis present

## 2016-10-08 DIAGNOSIS — R1310 Dysphagia, unspecified: Secondary | ICD-10-CM

## 2016-10-08 DIAGNOSIS — R079 Chest pain, unspecified: Secondary | ICD-10-CM | POA: Diagnosis not present

## 2016-10-08 DIAGNOSIS — Z955 Presence of coronary angioplasty implant and graft: Secondary | ICD-10-CM

## 2016-10-08 DIAGNOSIS — M5136 Other intervertebral disc degeneration, lumbar region: Secondary | ICD-10-CM | POA: Diagnosis present

## 2016-10-08 DIAGNOSIS — I119 Hypertensive heart disease without heart failure: Secondary | ICD-10-CM | POA: Diagnosis present

## 2016-10-08 DIAGNOSIS — I25110 Atherosclerotic heart disease of native coronary artery with unstable angina pectoris: Principal | ICD-10-CM | POA: Diagnosis present

## 2016-10-08 DIAGNOSIS — K220 Achalasia of cardia: Secondary | ICD-10-CM | POA: Diagnosis present

## 2016-10-08 DIAGNOSIS — J45909 Unspecified asthma, uncomplicated: Secondary | ICD-10-CM | POA: Diagnosis present

## 2016-10-08 DIAGNOSIS — K219 Gastro-esophageal reflux disease without esophagitis: Secondary | ICD-10-CM | POA: Diagnosis present

## 2016-10-08 DIAGNOSIS — F1210 Cannabis abuse, uncomplicated: Secondary | ICD-10-CM | POA: Diagnosis present

## 2016-10-08 DIAGNOSIS — Z96611 Presence of right artificial shoulder joint: Secondary | ICD-10-CM | POA: Diagnosis present

## 2016-10-08 DIAGNOSIS — Z79899 Other long term (current) drug therapy: Secondary | ICD-10-CM

## 2016-10-08 DIAGNOSIS — I200 Unstable angina: Secondary | ICD-10-CM

## 2016-10-08 DIAGNOSIS — M51369 Other intervertebral disc degeneration, lumbar region without mention of lumbar back pain or lower extremity pain: Secondary | ICD-10-CM | POA: Diagnosis present

## 2016-10-08 DIAGNOSIS — I2582 Chronic total occlusion of coronary artery: Secondary | ICD-10-CM | POA: Diagnosis present

## 2016-10-08 DIAGNOSIS — M5126 Other intervertebral disc displacement, lumbar region: Secondary | ICD-10-CM | POA: Diagnosis present

## 2016-10-08 DIAGNOSIS — I10 Essential (primary) hypertension: Secondary | ICD-10-CM | POA: Diagnosis present

## 2016-10-08 DIAGNOSIS — G4733 Obstructive sleep apnea (adult) (pediatric): Secondary | ICD-10-CM | POA: Diagnosis present

## 2016-10-08 DIAGNOSIS — R7303 Prediabetes: Secondary | ICD-10-CM | POA: Diagnosis present

## 2016-10-08 DIAGNOSIS — G4730 Sleep apnea, unspecified: Secondary | ICD-10-CM | POA: Diagnosis present

## 2016-10-08 DIAGNOSIS — F1410 Cocaine abuse, uncomplicated: Secondary | ICD-10-CM | POA: Diagnosis present

## 2016-10-08 DIAGNOSIS — R0789 Other chest pain: Secondary | ICD-10-CM

## 2016-10-08 DIAGNOSIS — F17210 Nicotine dependence, cigarettes, uncomplicated: Secondary | ICD-10-CM | POA: Diagnosis present

## 2016-10-08 DIAGNOSIS — M545 Low back pain: Secondary | ICD-10-CM | POA: Diagnosis present

## 2016-10-08 DIAGNOSIS — E785 Hyperlipidemia, unspecified: Secondary | ICD-10-CM | POA: Diagnosis present

## 2016-10-08 DIAGNOSIS — G8929 Other chronic pain: Secondary | ICD-10-CM | POA: Diagnosis present

## 2016-10-08 DIAGNOSIS — Z21 Asymptomatic human immunodeficiency virus [HIV] infection status: Secondary | ICD-10-CM | POA: Diagnosis present

## 2016-10-08 DIAGNOSIS — Z8249 Family history of ischemic heart disease and other diseases of the circulatory system: Secondary | ICD-10-CM

## 2016-10-08 DIAGNOSIS — B370 Candidal stomatitis: Secondary | ICD-10-CM | POA: Diagnosis present

## 2016-10-08 DIAGNOSIS — F1910 Other psychoactive substance abuse, uncomplicated: Secondary | ICD-10-CM

## 2016-10-08 HISTORY — DX: Calculus of gallbladder without cholecystitis without obstruction: K80.20

## 2016-10-08 HISTORY — DX: Cocaine abuse, uncomplicated: F14.10

## 2016-10-08 LAB — TROPONIN I: Troponin I: <0.03 ng/mL (ref <0.03)

## 2016-10-08 LAB — CBC
HEMATOCRIT: 42.6 % (ref 39.0–52.0)
Hemoglobin: 14.1 g/dL (ref 13.0–17.0)
MCH: 26 pg (ref 26.0–34.0)
MCHC: 33.1 g/dL (ref 30.0–36.0)
MCV: 78.5 fL (ref 78.0–100.0)
Platelets: 183 10*3/uL (ref 150–400)
RBC: 5.43 MIL/uL (ref 4.22–5.81)
RDW: 14.5 % (ref 11.5–15.5)
WBC: 5.8 10*3/uL (ref 4.0–10.5)

## 2016-10-08 LAB — I-STAT TROPONIN, ED
TROPONIN I, POC: 0.02 ng/mL (ref 0.00–0.08)
Troponin i, poc: 0.04 ng/mL (ref 0.00–0.08)

## 2016-10-08 LAB — RAPID URINE DRUG SCREEN, HOSP PERFORMED
AMPHETAMINES: NOT DETECTED
BARBITURATES: POSITIVE — AB
BENZODIAZEPINES: NOT DETECTED
COCAINE: POSITIVE — AB
Opiates: NOT DETECTED
TETRAHYDROCANNABINOL: POSITIVE — AB

## 2016-10-08 LAB — BASIC METABOLIC PANEL
ANION GAP: 8 (ref 5–15)
BUN: 10 mg/dL (ref 6–20)
CO2: 23 mmol/L (ref 22–32)
Calcium: 9.6 mg/dL (ref 8.9–10.3)
Chloride: 108 mmol/L (ref 101–111)
Creatinine, Ser: 1.25 mg/dL — ABNORMAL HIGH (ref 0.61–1.24)
GFR calc non Af Amer: >60 mL/min (ref >60)
GLUCOSE: 114 mg/dL — AB (ref 65–99)
Potassium: 4.1 mmol/L (ref 3.5–5.1)
Sodium: 139 mmol/L (ref 135–145)

## 2016-10-08 MED ORDER — DARUNAVIR ETHANOLATE 800 MG PO TABS
800.0000 mg | ORAL_TABLET | Freq: Every evening | ORAL | Status: DC
  Administered 2016-10-08 – 2016-10-11 (×4): 800 mg via ORAL
  Filled 2016-10-08 (×5): qty 1

## 2016-10-08 MED ORDER — ACETAMINOPHEN 325 MG PO TABS: 650.0000 mg | ORAL_TABLET | ORAL | Status: DC | PRN

## 2016-10-08 MED ORDER — GI COCKTAIL ~~LOC~~: 30.0000 mL | Freq: Four times a day (QID) | ORAL | Status: DC | PRN

## 2016-10-08 MED ORDER — ENSURE ENLIVE PO LIQD: 237.0000 mL | Freq: Two times a day (BID) | ORAL | Status: DC

## 2016-10-08 MED ORDER — ONDANSETRON HCL 4 MG/2ML IJ SOLN: 4.0000 mg | Freq: Four times a day (QID) | INTRAMUSCULAR | Status: DC | PRN

## 2016-10-08 MED ORDER — SUCRALFATE 1 G PO TABS
1.0000 g | ORAL_TABLET | Freq: Three times a day (TID) | ORAL | Status: DC
  Administered 2016-10-08 – 2016-10-12 (×13): 1 g via ORAL
  Filled 2016-10-08 (×16): qty 1

## 2016-10-08 MED ORDER — ELVITEG-COBIC-EMTRICIT-TENOFAF 150-150-200-10 MG PO TABS
1.0000 | ORAL_TABLET | Freq: Every evening | ORAL | Status: DC
  Administered 2016-10-08 – 2016-10-11 (×4): 1 via ORAL
  Filled 2016-10-08 (×6): qty 1

## 2016-10-08 MED ORDER — MORPHINE SULFATE (PF) 4 MG/ML IV SOLN: 2.0000 mg | INTRAVENOUS | Status: DC | PRN

## 2016-10-08 MED ORDER — PANTOPRAZOLE SODIUM 20 MG PO TBEC
20.0000 mg | DELAYED_RELEASE_TABLET | Freq: Every day | ORAL | Status: DC
  Administered 2016-10-09 – 2016-10-12 (×4): 20 mg via ORAL
  Filled 2016-10-08 (×4): qty 1

## 2016-10-08 MED ORDER — CYCLOBENZAPRINE HCL 10 MG PO TABS: 5.0000 mg | ORAL_TABLET | Freq: Three times a day (TID) | ORAL | Status: DC | PRN

## 2016-10-08 MED ORDER — ZOLPIDEM TARTRATE 5 MG PO TABS
5.0000 mg | ORAL_TABLET | Freq: Every evening | ORAL | Status: DC | PRN
  Administered 2016-10-08 – 2016-10-11 (×4): 5 mg via ORAL
  Filled 2016-10-08 (×4): qty 1

## 2016-10-08 MED ORDER — HEPARIN SODIUM (PORCINE) 5000 UNIT/ML IJ SOLN
5000.0000 [IU] | Freq: Three times a day (TID) | INTRAMUSCULAR | Status: DC
  Filled 2016-10-08: qty 1

## 2016-10-08 MED ORDER — ASPIRIN 81 MG PO CHEW
324.0000 mg | CHEWABLE_TABLET | Freq: Once | ORAL | Status: AC
  Administered 2016-10-08: 324 mg via ORAL
  Filled 2016-10-08: qty 4

## 2016-10-08 MED ORDER — ALPRAZOLAM 0.25 MG PO TABS
0.2500 mg | ORAL_TABLET | Freq: Two times a day (BID) | ORAL | Status: DC | PRN
  Administered 2016-10-09 – 2016-10-10 (×2): 0.25 mg via ORAL
  Filled 2016-10-08 (×2): qty 1

## 2016-10-08 NOTE — Consult Note (Signed)
Cardiology Consultation Note    Patient ID: Benjamin Herrera, MRN: [id number], DOB/AGE: 50-Aug-1967 50 y.o. Admit date: 10/08/2016   Date of Consult: 10/08/2016 Primary Physician: Minerva Ends, MD Primary Cardiologist: New to Dr. Debara Pickett  Chief Complaint: chest pain Reason for Consultation: chest pain Requesting MD: Dr. Ara Kussmaul  HPI: Benjamin Herrera is a 50 y.o. male with history of HIV, achalasia, GERD, HTN, sleep apnea, cholelithasis, polysubstance abuse including cocaine, prior tobacco abuse who presented to ER for the second time this week with chest pain. He denies prior cardiac evaluation. He states he uses cocaine intermittently and last did so 3 weeks ago. He quit smoking cigarettes about a week ago (smoked since 14). He has a history of intermittent abdominal pain that has been attributed to GI cause - GERD and achalasia. He has been having chest pain ever since 10/04/16. He initially attributed it to GI source but it seemed more severe than before. On Monday he had intermittent 10-minute episodes. One of these episodes seemed to be worsened by smoking a cigarette so he swore off smoking. On Wednesday 12/13 he began to have 15-20 minute episodes of chest pain shooting up into his jaw. He subsequently decided to go to the [facility] where troponins were negative. Outpatient cardiology follow-up with stress testing was recommended. He was prescribed sucralfate and protonix. He got home around 3am from the ER and went to sleep. He noted the medication may have helped a little bit. However, around 11am this morning his pain started back up, again radiating into his jaw and at times into his throat. It is not worse with inspiration, palpation,meals, or exertion. It was not associated with any dyspnea, diaphoresis, nausea, or vomiting. Initial workup notable for mildly elevated BP (153/84 max), pulse ox wnl, not tachycardic/tachypneic or SOB. No recent travel, surgery, bedrest. Troponins neg x 3, Cr  1.25, Hgb 14.1. CXR with mild bronchitic changes, normal heart size. He currently denies any complaints except "hungry." + fam hx of CAD with MI in mother at age 10.  Past Medical History:  Diagnosis Date  . Achalasia and cardiospasm   . Allergy   . Arthritis    back ,shoulders   . Asthma   . Boils    under arms hx of  . Candidiasis of mouth   . Candidiasis of unspecified site   . Cholelithiasis   . Cocaine abuse   . Condyloma acuminatum in male of scrotum & anal canal s/p laser ablation 07/03/2012  . Dysphagia   . Fracture, humerus   . GERD (gastroesophageal reflux disease)   . Hemorrhoids   . Hepatitis 1993   A  . Herpes labialis   . HIV (human immunodeficiency virus infection) ([place]) [place]  . Hypertension Dx 2014   off all bp meds for last 9 or 10 months  . Pilonidal disease 01/10/2012  . Polysubstance abuse   . Psoriasis    hx of  . Sleep apnea    does  have CPAP  . 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 BALLOON DILATION N/A 11/11/2015   Procedure: BALLOON DILATION;  Surgeon: Mauri Pole, MD;  Location: [facility];  Service: Endoscopy;  Laterality: N/A;  . BOTOX INJECTION  10/11/2012   Procedure: BOTOX INJECTION;  Surgeon: Inda Castle, MD;  Location: [facility];  Service: Endoscopy;  Laterality: N/A;  . BOTOX INJECTION N/A 01/25/2013   Procedure: BOTOX INJECTION;  Surgeon: Inda Castle, MD;  Location: [facility];  Service: Endoscopy;  Laterality: N/A;  . BOTOX INJECTION N/A 06/21/2013   Procedure: BOTOX INJECTION;  Surgeon: Inda Castle, MD;  Location: [facility];  Service: Endoscopy;  Laterality: N/A;  . BOTOX INJECTION N/A 10/08/2013   Procedure: BOTOX INJECTION;  Surgeon: Inda Castle, MD;  Location: [facility];  Service: Endoscopy;   Laterality: N/A;  . BOTOX INJECTION N/A 06/16/2015   Procedure: BOTOX INJECTION;  Surgeon: Inda Castle, MD;  Location: [facility];  Service: Endoscopy;  Laterality: N/A;  . COLONOSCOPY  05/19/2012   Procedure: COLONOSCOPY;  Surgeon: Inda Castle, MD;  Location: [facility];  Service: Endoscopy;  Laterality: N/A;  jill trying to contact pt to come in 0830 for 930 case, phone not accepting messages  . COLONOSCOPY    . ESOPHAGEAL MANOMETRY  05/29/2012   Procedure: ESOPHAGEAL MANOMETRY (EM);  Surgeon: Inda Castle, MD;  Location: [facility];  Service: Endoscopy;  Laterality: N/A;  . ESOPHAGEAL MANOMETRY N/A 10/06/2015   Procedure: ESOPHAGEAL MANOMETRY (EM);  Surgeon: Mauri Pole, MD;  Location: [facility];  Service: Endoscopy;  Laterality: N/A;  . ESOPHAGOGASTRODUODENOSCOPY  11/11/2011   Procedure: ESOPHAGOGASTRODUODENOSCOPY (EGD);  Surgeon: Inda Castle, MD;  Location: [facility];  Service: Endoscopy;  Laterality: N/A;  botox injection  called Pt to change time of procedure per Dr Deatra Ina  . ESOPHAGOGASTRODUODENOSCOPY  03/10/2012   Procedure: ESOPHAGOGASTRODUODENOSCOPY (EGD);  Surgeon: Inda Castle, MD;  Location: [facility];  Service: Endoscopy;  Laterality: N/A;  . ESOPHAGOGASTRODUODENOSCOPY  10/11/2012   Procedure: ESOPHAGOGASTRODUODENOSCOPY (EGD);  Surgeon: Inda Castle, MD;  Location: [facility];  Service: Endoscopy;  Laterality: N/A;  . ESOPHAGOGASTRODUODENOSCOPY N/A 01/25/2013   Procedure: ESOPHAGOGASTRODUODENOSCOPY (EGD);  Surgeon: Inda Castle, MD;  Location: [facility];  Service: Endoscopy;  Laterality: N/A;  . ESOPHAGOGASTRODUODENOSCOPY N/A 06/21/2013   Procedure: ESOPHAGOGASTRODUODENOSCOPY (EGD);  Surgeon: Inda Castle, MD;  Location: [facility];  Service: Endoscopy;  Laterality: N/A;  . ESOPHAGOGASTRODUODENOSCOPY (EGD) WITH PROPOFOL N/A 10/08/2013   Procedure: ESOPHAGOGASTRODUODENOSCOPY (EGD) WITH PROPOFOL;  Surgeon: Inda Castle, MD;   Location: [facility];  Service: Endoscopy;  Laterality: N/A;  . ESOPHAGOGASTRODUODENOSCOPY (EGD) WITH PROPOFOL N/A 06/16/2015   Procedure: ESOPHAGOGASTRODUODENOSCOPY (EGD) WITH PROPOFOL;  Surgeon: Inda Castle, MD;  Location: [facility];  Service: Endoscopy;  Laterality: N/A;  . ESOPHAGOGASTRODUODENOSCOPY (EGD) WITH PROPOFOL N/A 11/11/2015   Procedure: ESOPHAGOGASTRODUODENOSCOPY (EGD) WITH PROPOFOL;  Surgeon: Mauri Pole, MD;  Location: [facility];  Service: Endoscopy;  Laterality: N/A;  . EXAMINATION UNDER ANESTHESIA  07/24/2012   Procedure: EXAM UNDER ANESTHESIA;  Surgeon: Adin Hector, MD;  Location: [facility];  Service: General;  Laterality: N/A;  . humeral fracture surgery Right yrs ago  . PILONIDAL CYST EXCISION  07/24/2012   Procedure: CYST EXCISION PILONIDAL SIMPLE;  Surgeon: Adin Hector, MD;  Location: [facility];  Service: General;  Laterality: N/A;  Exam Under Anesthesia,, Excision Pilonidal Disease,   . right shoulder replacement  2008   x 2  . UPPER GASTROINTESTINAL ENDOSCOPY    . WART FULGURATION  07/24/2012   Procedure: FULGURATION ANAL WART;  Surgeon: Adin Hector, MD;  Location: [facility];  Service: General;  Laterality: N/A;  excision of raphe mass     Home Meds: Prior to Admission  medications   Medication Sig Start Date End Date Taking? Authorizing Provider  Darunavir Ethanolate (PREZISTA) 800 MG tablet Take 1 tablet (800 mg total) by mouth daily with breakfast. Patient taking differently: Take 800 mg by mouth every evening.  10/08/15  Yes Campbell Riches, MD  elvitegravir-cobicistat-emtricitabine-tenofovir (GENVOYA) 150-150-200-10 MG TABS tablet Take 1 tablet by mouth daily with breakfast. Patient taking differently: Take 1 tablet by mouth every evening.  10/08/15  Yes Campbell Riches, MD  Naphazoline-Pheniramine (OPCON-A OP) Apply 1-2 drops to eye 2 (two) times daily as needed (dry eyes.).    Yes Historical Provider, MD  pantoprazole (PROTONIX) 20 MG  tablet Take 1 tablet (20 mg total) by mouth daily. 10/06/16  Yes Antonietta Breach, PA-C  sucralfate (CARAFATE) 1 g tablet Take 1 tablet (1 g total) by mouth 4 (four) times daily -  with meals and at bedtime. 10/06/16  Yes Kelly Humes, PA-C  VOLTAREN 1 % GEL APPLY 2 G TOPICALLY 2 (TWO) TIMES DAILY. 10/07/16  Yes Michel Bickers, MD  cyclobenzaprine (FLEXERIL) 5 MG tablet Take 1 tablet (5 mg total) by mouth 3 (three) times daily as needed for muscle spasms. 08/03/16   Campbell Riches, MD    Inpatient Medications:  . Darunavir Ethanolate  800 mg Oral QPM  . elvitegravir-cobicistat-emtricitabine-tenofovir  1 tablet Oral QPM  . heparin  5,000 Units Subcutaneous Q8H  . [START ON 10/09/2016] pantoprazole  20 mg Oral Daily  . sucralfate  1 g Oral TID WC & HS     Allergies: No Known Allergies  Social History   Social History  . Marital status: Single    Spouse name: N/A  . Number of children: N/A  . Years of education: N/A   Occupational History  . Not on file.   Social History Main Topics  . Smoking status: Former Smoker    Packs/day: 0.50    Years: 25.00    Types: Cigarettes    Quit date: 11/26/2015  . Smokeless tobacco: Never Used  . Alcohol use No  . Drug use:     Frequency: 3.0 times per week    Types: Marijuana, Cocaine  . Sexual activity: Not Currently    Partners: Male     Comment: pt. given condoms, used marijuana -3 days ago 05/14/16   Other Topics Concern  . Not on file   Social History Narrative  . No narrative on file     Family History  Problem Relation Age of Onset  . Arthritis Mother   . Hypertension Mother   . Heart disease Mother     MI age 38  . Diabetes Maternal Grandmother   . Stomach cancer Paternal Grandmother   . Colon cancer Neg Hx   . Colon polyps Neg Hx   . Esophageal cancer Neg Hx   . Rectal cancer Neg Hx      Review of Systems: previously experienced weight loss earlier this year after not eating well but this resolved. All other systems  reviewed and are otherwise negative except as noted above.  Labs:  Recent Labs  10/08/16 1737  TROPONINI <0.03   Lab Results  Component Value Date   WBC 5.8 10/08/2016   HGB 14.1 10/08/2016   HCT 42.6 10/08/2016   MCV 78.5 10/08/2016   PLT 183 10/08/2016     Recent Labs Lab 10/08/16 1204  NA 139  K 4.1  CL 108  CO2 23  BUN 10  CREATININE 1.25*  CALCIUM 9.6  GLUCOSE  114*   Lab Results  Component Value Date   CHOL 180 01/07/2016   HDL 31 (L) 01/07/2016   LDLCALC 124 01/07/2016   TRIG 124 01/07/2016   No results found for: DDIMER  Radiology/Studies:  Dg Chest 2 View  Result Date: 10/08/2016 CLINICAL DATA:  Central chest pain radiating to jaw for 4 days. EXAM: CHEST  2 VIEW COMPARISON:  10/06/2016 FINDINGS: Mild peribronchial thickening and interstitial prominence, similar to prior study. Heart is normal size. No confluent opacities or effusions. No acute bony abnormality. IMPRESSION: Mild bronchitic changes, likely chronic. Electronically Signed   By: Rolm Baptise M.D.   On: 10/08/2016 12:41   Dg Chest 2 View  Result Date: 10/06/2016 CLINICAL DATA:  Retrosternal chest pain radiating into the jaw all with tingling in the left arm for the past 3 days. History of asthma, former smoker, gastroesophageal reflux. EXAM: CHEST  2 VIEW COMPARISON:  PA and lateral chest x-ray of July 02, 2011 and PA chest x-ray of May 06, 2016 FINDINGS: The lungs are adequately inflated. The interstitial markings are coarse bilaterally but are stable. There is no alveolar infiltrate. There is no pleural effusion or pneumothorax. The heart and pulmonary vascularity are normal. There is calcification in the wall of the aortic arch. The observed bony thorax exhibits no acute abnormality. 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 IMPRESSION: Mild chronic bronchitic-smoking related changes. No pneumonia, CHF, nor other acute cardiopulmonary abnormality. Thoracic aortic atherosclerosis.  Electronically Signed   By: David  Martinique M.D.   On: 10/06/2016 17:10    Wt Readings from Last 3 Encounters:  10/08/16 234 lb (106.1 kg)  10/06/16 224 lb 4 oz (101.7 kg)  07/28/16 226 lb (102.5 kg)    EKG: 12/15: NSR with TWI III, avF, otherwise nonacute 12/13: NSR no acute ST-T changes  Physical Exam Blood pressure 156/90, pulse 73, temperature 98.3 F (36.8 C), temperature source Oral, resp. rate 18, height 6\' 1"  (1.854 m), weight 234 lb (106.1 kg), SpO2 97 %. Body mass index is 30.87 kg/m. General: Well developed, well nourished AAM, in no acute distress. Head: Normocephalic, atraumatic, sclera non-icteric, no xanthomas, nares are without discharge.  Neck: Negative for carotid bruits. JVD not elevated. Lungs: Clear bilaterally to auscultation without wheezes, rales, or rhonchi. Breathing is unlabored. Heart: RRR with S1 S2. No murmurs, rubs, or gallops appreciated. Abdomen: Soft, non-tender, non-distended with normoactive bowel sounds. No hepatomegaly. No rebound/guarding. No obvious abdominal masses. Msk:  Strength and tone appear normal for age. Extremities: No clubbing or cyanosis. No edema.  Distal pedal pulses are 2+ and equal bilaterally. Neuro: Alert and oriented X 3. No facial asymmetry. No focal deficit. Moves all extremities spontaneously. Psych:  Responds to questions appropriately with a normal affect.     Assessment and Plan  43M with HIV, achalasia, GERD, HTN, sleep apnea, cholelithasis, polysubstance abuse including cocaine who presented to ER for the second time this week with chest pain and nonspecific EKG changes.  1. Chest pain - mixed atypical/typical features, EKG with nonspecific ST-T changes. Cardiac risk factors include mild HTN, tobacco abuse and family history of CAD. Certainly has substrate for other possible causes of noncardiac chest pain as well including h/o achalasia, GERD, esophageal spasm and cholelithasis by imaging in 2016. Will review further  workup with MD. This is likely to be inpatient workup given his recurrent presentation over the last week. Check lipids in AM. Continue ASA in AM if no evidence of GI bleeding or acute GI  pathology overnight (received 324mg  today). Agree with echocardiogram.  2. HTN - trend blood pressure. Could consider initiation of long-acting nitrates in case pain represents either angina or esophageal spasm.  3. Polysubstance abuse - patient has sworn off tobacco since feeling bad after smoking earlier this week. He states he last used cocaine 3 weeks ago. Check UDS (would be + for cocaine if used in last 3 days typically). Dangers of cocaine use reviewed with patient including MI/death.  4. HIV - per internal medicine.  5. Mildly elevated Cr - per internal medicine.   Signed, Charlie Pitter PA-C 10/08/2016, 7:42 PM Pager: [phone]

## 2016-10-08 NOTE — ED Provider Notes (Signed)
[facility] DEPT Provider Note   CSN: [id number] Arrival date & time: 10/08/16 1153     History    Chief Complaint  Patient presents with  . Chest Pain     HPI Benjamin Herrera is a 50 y.o. male.  50yo M w/ PMH including HIV on HAART, HTN, achalasia, GERD, OSA, polysubstance abuse who p/w chest pain. Pt has had 4 days of intermittent, central chest pressure that radiates up into his jaw and is associated w/ nausea, diaphoresis. The pain occurs at rest, Nothing makes it better or worse. He had chest pain earlier but denies any pain currently. The episodes last approximately 30 minutes and then resolved spontaneously. He was evaluated at [facility] long 2 days ago for these symptoms and was discharged after his workup was reassuring. He presents today because he has continued to to have the episodes and they have become progressively worse. He denies any associated shortness of breath, leg swelling/pain, recent travel, fevers, or vomiting. He has had mild cold symptoms recently but no severe cough. Family history notable for mother with MI at age 100. No family history of blood clots.  Endorses occasional marijuana use, no recent cocaine use. He smoked a cigarette a few days ago which seemed to make his pain worse so he stopped.  Past Medical History:  Diagnosis Date  . Achalasia and cardiospasm   . Allergy   . Arthritis    back ,shoulders   . Asthma   . Boils    under arms hx of  . Candidiasis of mouth   . Candidiasis of unspecified site   . Condyloma acuminatum in male of scrotum & anal canal s/p laser ablation 07/03/2012  . Dysphagia   . Fracture, humerus   . GERD (gastroesophageal reflux disease)   . Hemorrhoids   . Hepatitis 1993   A  . Herpes labialis   . HIV (human immunodeficiency virus infection) ([place]) [place]  . Hypertension Dx 2014   off all bp meds for last 9 or 10 months  . Pilonidal disease 01/10/2012  . Polysubstance abuse   . Psoriasis    hx of  . Sleep apnea     does  have CPAP  . 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 Chest pain 10/08/2016  . Dysphagia   . GERD (gastroesophageal reflux disease) 01/10/2015  . Chronic low back pain 10/07/2014  . Syncope and collapse 10/07/2014  . Mild sleep apnea 10/07/2014  . 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  . Numbness and tingling in left hand 02/12/2013  . Myalgia 02/12/2013  . Internal hemorrhoids with bleeding 01/30/2013  . 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 Condyloma acuminatum in male of scrotum & anal canal s/p laser ablation 07/03/2012  . HTN (hypertension) 08/31/2010  . PSORIASIS 11/20/2009  . Achalasia and cardiospasm 08/14/2009  . OTHER CANDIDIASIS OF OTHER SPECIFIED SITES 06/26/2009  . Marijuana use 05/16/2008  . ASTHMA, UNSPECIFIED, UNSPECIFIED STATUS 05/16/2008  . HIV disease ([place]) 09/03/2006  . Closed fracture of part of humerus 09/03/2006    Past Surgical History:  Procedure Laterality Date  . BALLOON DILATION N/A 11/11/2015   Procedure: BALLOON DILATION;  Surgeon: Mauri Pole, MD;  Location: [facility] ENDOSCOPY;  Service:  Endoscopy;  Laterality: N/A;  . BOTOX INJECTION  10/11/2012   Procedure: BOTOX INJECTION;  Surgeon: Inda Castle, MD;  Location: [facility] ENDOS[facility];  Service: Endoscopy;  Laterality: N/A;  . BOTOX INJECTION N/A 01/25/2013   Procedure: BOTOX INJECTION;  Surgeon: Inda Castle, MD;  Location: [facility];  Service: Endoscopy;  Laterality: N/A;  . BOTOX INJECTION N/A 06/21/2013   Procedure: BOTOX INJECTION;  Surgeon: Inda Castle, MD;  Location: [facility];  Service: Endoscopy;  Laterality: N/A;  . BOTOX INJECTION N/A 10/08/2013   Procedure: BOTOX INJECTION;  Surgeon: Inda Castle, MD;  Location: [facility]  ENDOS[facility];  Service: Endoscopy;  Laterality: N/A;  . BOTOX INJECTION N/A 06/16/2015   Procedure: BOTOX INJECTION;  Surgeon: Inda Castle, MD;  Location: [facility];  Service: Endoscopy;  Laterality: N/A;  . COLONOSCOPY  05/19/2012   Procedure: COLONOSCOPY;  Surgeon: Inda Castle, MD;  Location: [facility];  Service: Endoscopy;  Laterality: N/A;  jill trying to contact pt to come in 0830 for 930 case, phone not accepting messages  . COLONOSCOPY    . ESOPHAGEAL MANOMETRY  05/29/2012   Procedure: ESOPHAGEAL MANOMETRY (EM);  Surgeon: Inda Castle, MD;  Location: [facility];  Service: Endoscopy;  Laterality: N/A;  . ESOPHAGEAL MANOMETRY N/A 10/06/2015   Procedure: ESOPHAGEAL MANOMETRY (EM);  Surgeon: Mauri Pole, MD;  Location: [facility];  Service: Endoscopy;  Laterality: N/A;  . ESOPHAGOGASTRODUODENOSCOPY  11/11/2011   Procedure: ESOPHAGOGASTRODUODENOSCOPY (EGD);  Surgeon: Inda Castle, MD;  Location: [facility];  Service: Endoscopy;  Laterality: N/A;  botox injection  called Pt to change time of procedure per Dr Deatra Ina  . ESOPHAGOGASTRODUODENOSCOPY  03/10/2012   Procedure: ESOPHAGOGASTRODUODENOSCOPY (EGD);  Surgeon: Inda Castle, MD;  Location: [facility];  Service: Endoscopy;  Laterality: N/A;  . ESOPHAGOGASTRODUODENOSCOPY  10/11/2012   Procedure: ESOPHAGOGASTRODUODENOSCOPY (EGD);  Surgeon: Inda Castle, MD;  Location: [facility];  Service: Endoscopy;  Laterality: N/A;  . ESOPHAGOGASTRODUODENOSCOPY N/A 01/25/2013   Procedure: ESOPHAGOGASTRODUODENOSCOPY (EGD);  Surgeon: Inda Castle, MD;  Location: [facility];  Service: Endoscopy;  Laterality: N/A;  . ESOPHAGOGASTRODUODENOSCOPY N/A 06/21/2013   Procedure: ESOPHAGOGASTRODUODENOSCOPY (EGD);  Surgeon: Inda Castle, MD;  Location: [facility];  Service: Endoscopy;  Laterality: N/A;  . ESOPHAGOGASTRODUODENOSCOPY (EGD) WITH PROPOFOL N/A 10/08/2013   Procedure: ESOPHAGOGASTRODUODENOSCOPY (EGD) WITH PROPOFOL;   Surgeon: Inda Castle, MD;  [facility]: [facility];  Service: Endoscopy;  Laterality: N/A;  . ESOPHAGOGASTRODUODENOSCOPY (EGD) WITH PROPOFOL N/A 06/16/2015   Procedure: ESOPHAGOGASTRODUODENOSCOPY (EGD) WITH PROPOFOL;  Surgeon: Inda Castle, MD;  [facility]: [facility];  Service: Endoscopy;  Laterality: N/A;  . ESOPHAGOGASTRODUODENOSCOPY (EGD) WITH PROPOFOL N/A 11/11/2015   Procedure: ESOPHAGOGASTRODUODENOSCOPY (EGD) WITH PROPOFOL;  Surgeon: Mauri Pole, MD;  Location: [facility];  Service: Endoscopy;  Laterality: N/A;  . EXAMINATION UNDER ANESTHESIA  07/24/2012   Procedure: EXAM UNDER ANESTHESIA;  Surgeon: Adin Hector, MD;  Location: [facility];  Service: General;  Laterality: N/A;  . humeral fracture surgery Right yrs ago  . PILONIDAL CYST EXCISION  07/24/2012   Procedure: CYST EXCISION PILONIDAL SIMPLE;  Surgeon: Adin Hector, MD;  Location: [facility];  Service: General;  Laterality: N/A;  Exam Under Anesthesia,, Excision Pilonidal Disease,   . right shoulder replacement  2008   x 2  . UPPER GASTROINTESTINAL ENDOSCOPY    . WART FULGURATION  07/24/2012   Procedure: FULGURATION ANAL WART;  Surgeon: Adin Hector, MD;  Location: [facility];  Service: General;  Laterality: N/A;  excision of raphe mass        Home Medications    Prior to Admission medications   Medication Sig Start Date End Date Taking? Authorizing Provider  cyclobenzaprine (FLEXERIL) 5 MG tablet Take 1 tablet (5 mg total) by mouth 3 (three) times daily as needed for muscle spasms. 08/03/16   Campbell Riches, MD  Darunavir Ethanolate (PREZISTA) 800 MG tablet Take 1 tablet (800 mg total) by mouth daily with breakfast. Patient taking differently: Take 800 mg by mouth every evening.  10/08/15   Campbell Riches, MD  elvitegravir-cobicistat-emtricitabine-tenofovir (GENVOYA) 150-150-200-10 MG TABS tablet Take 1 tablet by mouth daily with breakfast. Patient taking differently: Take 1 tablet by mouth every evening.   10/08/15   Campbell Riches, MD  Naphazoline-Pheniramine (OPCON-A OP) Apply 1-2 drops to eye 2 (two) times daily as needed (dry eyes.).     Historical Provider, MD  omeprazole (PRILOSEC) 40 MG capsule Take 1 capsule (40 mg total) by mouth daily before breakfast. Patient not taking: Reported on 10/06/2016 01/07/16   Mauri Pole, MD  pantoprazole (PROTONIX) 20 MG tablet Take 1 tablet (20 mg total) by mouth daily. 10/06/16   Antonietta Breach, PA-C  sucralfate (CARAFATE) 1 g tablet Take 1 tablet (1 g total) by mouth 4 (four) times daily -  with meals and at bedtime. 10/06/16   Antonietta Breach, PA-C  VOLTAREN 1 % GEL APPLY 2 G TOPICALLY 2 (TWO) TIMES DAILY. 10/07/16   Michel Bickers, MD      Family History  Problem Relation Age of Onset  . Arthritis Mother   . Hypertension Mother   . Heart disease Mother   . Diabetes Maternal Grandmother   . Stomach cancer Paternal Grandmother   . Colon cancer Neg Hx   . Colon polyps Neg Hx   . Esophageal cancer Neg Hx   . Rectal cancer Neg Hx      Social History  Substance Use Topics  . Smoking status: Former Smoker    Packs/day: 0.50    Years: 25.00    Types: Cigarettes    Quit date: 11/26/2015  . Smokeless tobacco: Never Used     Comment: not ready  . Alcohol use No     Allergies     Patient has no known allergies.    Review of Systems  10 Systems reviewed and are negative for acute change except as noted in the HPI.   Physical Exam Updated Vital Signs BP 149/84   Pulse 69   Temp 98.1 F (36.7 C) (Oral)   Resp 20   Ht 6\' 1"  (1.854 m)   Wt 234 lb (106.1 kg)   SpO2 97%   BMI 30.87 kg/m   Physical Exam  Constitutional: He is oriented to person, place, and time. He appears well-developed and well-nourished. No distress.  HENT:  Head: Normocephalic and atraumatic.  Moist mucous membranes  Eyes: Pupils are equal, round, and reactive to light. Right conjunctiva is injected. Left conjunctiva is injected.  Neck: Neck supple.    Cardiovascular: Normal rate, regular rhythm and normal heart sounds.   No murmur heard. Pulmonary/Chest: Effort normal and breath sounds normal.  Abdominal: Soft. Bowel sounds are normal. He exhibits no distension. There is no tenderness.  Musculoskeletal: He exhibits no edema.  Neurological: He is alert and oriented to person, place, and time.  Fluent speech  Skin: Skin is warm and dry.  Psychiatric: He has a normal mood and affect. Judgment normal.  Nursing note and vitals reviewed.     ED Treatments / Results  Labs (all labs ordered are listed, but only abnormal results are displayed) Labs Reviewed  BASIC METABOLIC PANEL - Abnormal; Notable for the following:       Result Value   Glucose, Bld 114 (*)    Creatinine, Ser 1.25 (*)    All other components within normal limits  CBC  TROPONIN I  TROPONIN I  TROPONIN I  HEMOGLOBIN A1C  I-STAT TROPOININ, ED  I-STAT TROPOININ, ED     EKG  EKG Interpretation  Date/Time:  Friday October 08 2016 12:06:53 EST Ventricular Rate:  73 PR Interval:  152 QRS Duration: 92 QT Interval:  382 QTC Calculation: 420 R Axis:   90 Text Interpretation:  Normal sinus rhythm Rightward axis Borderline ECG persistent T wave inversions in inferior leads new from older tracings Confirmed by LITTLE MD, RACHEL [phone]) on 10/08/2016 3:05:47 PM         Radiology Dg Chest 2 View  Result Date: 10/08/2016 CLINICAL DATA:  Central chest pain radiating to jaw for 4 days. EXAM: CHEST  2 VIEW COMPARISON:  10/06/2016 FINDINGS: Mild peribronchial thickening and interstitial prominence, similar to prior study. Heart is normal size. No confluent opacities or effusions. No acute bony abnormality. IMPRESSION: Mild bronchitic changes, likely chronic. Electronically Signed   By: Rolm Baptise M.D.   On: 10/08/2016 12:41   Dg Chest 2 View  Result Date: 10/06/2016 CLINICAL DATA:  Retrosternal chest pain radiating into the jaw all with tingling in the left arm  for the past 3 days. History of asthma, former smoker, gastroesophageal reflux. EXAM: CHEST  2 VIEW COMPARISON:  PA and lateral chest x-ray of July 02, 2011 and PA chest x-ray of May 06, 2016 FINDINGS: The lungs are adequately inflated. The interstitial markings are coarse bilaterally but are stable. There is no alveolar infiltrate. There is no pleural effusion or pneumothorax. The heart and pulmonary vascularity are normal. There is calcification in the wall of the aortic arch. The observed bony thorax exhibits no acute abnormality. 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 IMPRESSION: Mild chronic bronchitic-smoking related changes. No pneumonia, CHF, nor other acute cardiopulmonary abnormality. Thoracic aortic atherosclerosis. Electronically Signed   By: David  Martinique M.D.   On: 10/06/2016 17:10    Procedures Procedures (including critical care time) Procedures  Medications Ordered in ED  Medications  pantoprazole (PROTONIX) EC tablet 20 mg (not administered)  sucralfate (CARAFATE) tablet 1 g (not administered)  cyclobenzaprine (FLEXERIL) tablet 5 mg (not administered)  Darunavir Ethanolate (PREZISTA) tablet 800 mg (not administered)  elvitegravir-cobicistat-emtricitabine-tenofovir (GENVOYA) 150-150-200-10 MG tablet 1 tablet (not administered)  morphine 4 MG/ML injection 2 mg (not administered)  gi cocktail (Maalox,Lidocaine,Donnatal) (not administered)  acetaminophen (TYLENOL) tablet 650 mg (not administered)  ondansetron (ZOFRAN) injection 4 mg (not administered)  heparin injection 5,000 Units (not administered)  ALPRAZolam (XANAX) tablet 0.25 mg (not administered)  zolpidem (AMBIEN) tablet 5 mg (not administered)  aspirin chewable tablet 324 mg (324 mg Oral Given 10/08/16 1533)     Initial Impression / Assessment and Plan / ED Course  I have reviewed the triage vital signs and the nursing notes.  Pertinent labs & imaging results that were available during my care of the  patient were reviewed by me and considered in my medical decision making (see chart for details).  Clinical Course    Pt p/w ongoing intermittent CP w/ nausea and diaphoresis. He was well-appearing on exam, vital  signs notable for mild hypertension. EKG showed new T-wave inversions in leads 3 and aVF compared to EKG from a few days ago. I do not see any previous EKGs with this pattern. Labs are reassuring with negative initial troponin. He denies any shortness of breath and has no risk factors for blood clots therefore PE seems unlikely. No ripping or tearing chest pain and no radiation to his back and given his normal chest x-ray I doubt aortic dissection. I am concerned about his new EKG changes given his worsening episodes and have recommended admission for further cardiac evaluation. Discussed admission w/ [facility], Sharene Butters. I have also contacted cardiology to see pt in consultation. Pt admitted for further work up.  Final Clinical Impressions(s) / ED Diagnoses   Final diagnoses:  Chest pain, unspecified type     New Prescriptions   No medications on file       Sharlett Iles, MD 10/08/16 1615

## 2016-10-08 NOTE — Telephone Encounter (Signed)
A user error has taken place: ERROR °

## 2016-10-08 NOTE — H&P (Signed)
Benjamin Herrera [id number] DOB: May 30, 1966 DOA: 10/08/2016   PCP: Minerva Ends, MD   Patient coming from:  Home  Chief Complaint: CHest pain   HPI: Benjamin Herrera is a 50 y.o. male with multiple medical issues listed below, including achalasia with cardiospasm, asthma,  GERD HIV, hypertension, sleep apnea, on CPAP, and a history of polysubstance abuse, presenting with complaints of substernal chest pain. In review, he had been seen on 10/06/2016, with intermittent chest pain, which require evaluation at the ER. His work up at the time, was negative. He returns with worsening symptoms, lasting about 20 minutes at most, substernal, but now radiating into the Joe, with associated nausea and diaphoresis. The pain occurs at rest, and nothing makes it better or worse. At the ER he did not have any episodes, dust, he did not get to receive any nitroglycerin in here he did receive Carafate, without any significant improvement of symptoms. He denies any shortness of breath, leg swelling or pain, recent travel, fevers or vomiting. He denies any cough or hemoptysis. Endorses occasional marijuana use, his last cocaine was one week ago. In addition, he has a history of tobacco abuse, but reports that has known since 1 week ago as well. He denies any history of alcohol abuse. Other recent factors include mother with a history of MI at age 81. There is no history of blood clots.    ED Course:  BP 156/90   Pulse 73   Temp 98.3 F (36.8 C) (Oral)   Resp 18   Ht 6\' 1"  (1.854 m)   Wt 106.1 kg (234 lb)   SpO2 97%   BMI 30.87 kg/m    sodium 139 potassium 4.1 creatinine 1.25 troponin initially 0.02, now 0.04  white count 5.8 hemoglobin 14.1 glucose 114 platelets 183  chest x-ray with no acute disease  EKG now shows some TW inversion.  Review of Systems: As per HPI otherwise 10 point review of systems negative.   Past Medical History:  Diagnosis Date  . Achalasia and cardiospasm   . Allergy     . Arthritis    back ,shoulders   . Asthma   . Boils    under arms hx of  . Candidiasis of mouth   . Candidiasis of unspecified site   . Condyloma acuminatum in male of scrotum & anal canal s/p laser ablation 07/03/2012  . Dysphagia   . Fracture, humerus   . GERD (gastroesophageal reflux disease)   . Hemorrhoids   . Hepatitis 1993   A  . Herpes labialis   . HIV (human immunodeficiency virus infection) ([place]  . Hypertension Dx 2014   off all bp meds for last 9 or 10 months  . Pilonidal disease 01/10/2012  . Polysubstance abuse   . Psoriasis    hx of  . Sleep apnea    does  have CPAP  . 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 BALLOON DILATION N/A 11/11/2015   Procedure: BALLOON DILATION;  Surgeon: Mauri Pole, MD;  Location: [facility];  Service: Endoscopy;  Laterality: N/A;  . BOTOX INJECTION  10/11/2012   Procedure: BOTOX INJECTION;  Surgeon: Inda Castle, MD;  [facility]: [facility];  Service: Endoscopy;  Laterality: N/A;  . BOTOX INJECTION N/A 01/25/2013  Procedure: BOTOX INJECTION;  Surgeon: Inda Castle, MD;  [facility];  Service: Endoscopy;  Laterality: N/A;  . BOTOX INJECTION N/A 06/21/2013   Procedure: BOTOX INJECTION;  Surgeon: Inda Castle, MD;  [facility];  Service: Endoscopy;  Laterality: N/A;  . BOTOX INJECTION N/A 10/08/2013   Procedure: BOTOX INJECTION;  Surgeon: Inda Castle, MD;  [facility];  Service: Endoscopy;  Laterality: N/A;  . BOTOX INJECTION N/A 06/16/2015   Procedure: BOTOX INJECTION;  Surgeon: Inda Castle, MD;  [facility];  Service: Endoscopy;  Laterality: N/A;  . COLONOSCOPY  05/19/2012   Procedure: COLONOSCOPY;  Surgeon: Inda Castle, MD;  [facility];  Service: Endoscopy;  Laterality:  N/A;  jill trying to contact pt to come in 0830 for 930 case, phone not accepting messages  . COLONOSCOPY    . ESOPHAGEAL MANOMETRY  05/29/2012   Procedure: ESOPHAGEAL MANOMETRY (EM);  Surgeon: Inda Castle, MD;  [facility];  Service: Endoscopy;  Laterality: N/A;  . ESOPHAGEAL MANOMETRY N/A 10/06/2015   Procedure: ESOPHAGEAL MANOMETRY (EM);  Surgeon: Mauri Pole, MD;  [facility];  Service: Endoscopy;  Laterality: N/A;  . ESOPHAGOGASTRODUODENOSCOPY  11/11/2011   Procedure: ESOPHAGOGASTRODUODENOSCOPY (EGD);  Surgeon: Inda Castle, MD;  Location: [facility];  Service: Endoscopy;  Laterality: N/A;  botox injection  called Pt to change time of procedure per Dr Deatra Ina  . ESOPHAGOGASTRODUODENOSCOPY  03/10/2012   Procedure: ESOPHAGOGASTRODUODENOSCOPY (EGD);  Surgeon: Inda Castle, MD;  Location: [facility];  Service: Endoscopy;  Laterality: N/A;  . ESOPHAGOGASTRODUODENOSCOPY  10/11/2012   Procedure: ESOPHAGOGASTRODUODENOSCOPY (EGD);  Surgeon: Inda Castle, MD;  Location: [facility];  Service: Endoscopy;  Laterality: N/A;  . ESOPHAGOGASTRODUODENOSCOPY N/A 01/25/2013   Procedure: ESOPHAGOGASTRODUODENOSCOPY (EGD);  Surgeon: Inda Castle, MD;  Location: [facility];  Service: Endoscopy;  Laterality: N/A;  . ESOPHAGOGASTRODUODENOSCOPY N/A 06/21/2013   Procedure: ESOPHAGOGASTRODUODENOSCOPY (EGD);  Surgeon: Inda Castle, MD;  Location: [facility];  Service: Endoscopy;  Laterality: N/A;  . ESOPHAGOGASTRODUODENOSCOPY (EGD) WITH PROPOFOL N/A 10/08/2013   Procedure: ESOPHAGOGASTRODUODENOSCOPY (EGD) WITH PROPOFOL;  Surgeon: Inda Castle, MD;  [facility];  Service: Endoscopy;  Laterality: N/A;  . ESOPHAGOGASTRODUODENOSCOPY (EGD) WITH PROPOFOL N/A 06/16/2015   Procedure: ESOPHAGOGASTRODUODENOSCOPY (EGD) WITH PROPOFOL;  Surgeon: Inda Castle, MD;  [facility];  Service: Endoscopy;  Laterality: N/A;  . ESOPHAGOGASTRODUODENOSCOPY (EGD) WITH  PROPOFOL N/A 11/11/2015   Procedure: ESOPHAGOGASTRODUODENOSCOPY (EGD) WITH PROPOFOL;  Surgeon: Mauri Pole, MD;  [facility];  Service: Endoscopy;  Laterality: N/A;  . EXAMINATION UNDER ANESTHESIA  07/24/2012   Procedure: EXAM UNDER ANESTHESIA;  Surgeon: Adin Hector, MD;  Location: [facility];  Service: General;  Laterality: N/A;  . humeral fracture surgery Right yrs ago  . PILONIDAL CYST EXCISION  07/24/2012   Procedure: CYST EXCISION PILONIDAL SIMPLE;  Surgeon: Adin Hector, MD;  Location: [facility];  Service: General;  Laterality: N/A;  Exam Under Anesthesia,, Excision Pilonidal Disease,   . right shoulder replacement  2008   x 2  . UPPER GASTROINTESTINAL ENDOSCOPY    . WART FULGURATION  07/24/2012   Procedure: FULGURATION ANAL WART;  Surgeon: Adin Hector, MD;  Location: [facility];  Service: General;  Laterality: N/A;  excision of raphe mass    Social History Social History   Social History  . Marital status: Single    Spouse name: N/A  . Number of children: N/A  . Years of  education: N/A   Occupational History  . Not on file.   Social History Main Topics  . Smoking status: Former Smoker    Packs/day: 0.50    Years: 25.00    Types: Cigarettes    Quit date: 11/26/2015  . Smokeless tobacco: Never Used     Comment: not ready  . Alcohol use No  . Drug use:     Frequency: 3.0 times per week    Types: Marijuana     Comment: 04-15-15 pt now denies cocaine use , last cocaine use 4 yrs ago, occasional marijuana use-"none used in a long time."  . Sexual activity: Not Currently    Partners: Male     Comment: pt. given condoms, used marijuana -3 days ago 05/14/16   Other Topics Concern  . Not on file   Social History Narrative  . No narrative on file     No Known Allergies  Family History  Problem Relation Age of Onset  . Arthritis Mother   . Hypertension Mother   . Heart disease Mother   . Diabetes Maternal Grandmother   . Stomach cancer Paternal  Grandmother   . Colon cancer Neg Hx   . Colon polyps Neg Hx   . Esophageal cancer Neg Hx   . Rectal cancer Neg Hx       Prior to Admission medications   Medication Sig Start Date End Date Taking? Authorizing Provider  cyclobenzaprine (FLEXERIL) 5 MG tablet Take 1 tablet (5 mg total) by mouth 3 (three) times daily as needed for muscle spasms. 08/03/16   Campbell Riches, MD  Darunavir Ethanolate (PREZISTA) 800 MG tablet Take 1 tablet (800 mg total) by mouth daily with breakfast. Patient taking differently: Take 800 mg by mouth every evening.  10/08/15   Campbell Riches, MD  elvitegravir-cobicistat-emtricitabine-tenofovir (GENVOYA) 150-150-200-10 MG TABS tablet Take 1 tablet by mouth daily with breakfast. Patient taking differently: Take 1 tablet by mouth every evening.  10/08/15   Campbell Riches, MD  Naphazoline-Pheniramine (OPCON-A OP) Apply 1-2 drops to eye 2 (two) times daily as needed (dry eyes.).     Historical Provider, MD  omeprazole (PRILOSEC) 40 MG capsule Take 1 capsule (40 mg total) by mouth daily before breakfast. Patient not taking: Reported on 10/06/2016 01/07/16   Mauri Pole, MD  pantoprazole (PROTONIX) 20 MG tablet Take 1 tablet (20 mg total) by mouth daily. 10/06/16   Antonietta Breach, PA-C  sucralfate (CARAFATE) 1 g tablet Take 1 tablet (1 g total) by mouth 4 (four) times daily -  with meals and at bedtime. 10/06/16   Antonietta Breach, PA-C  VOLTAREN 1 % GEL APPLY 2 G TOPICALLY 2 (TWO) TIMES DAILY. 10/07/16   Michel Bickers, MD    Physical Exam:    Vitals:   10/08/16 1445 10/08/16 1515 10/08/16 1530 10/08/16 1615  BP: 147/80 158/86 149/84 156/90  Pulse: 82 74 69 73  Resp: 18 15 20 18   Temp:    98.3 F (36.8 C)  TempSrc:    Oral  SpO2: 98% 97% 97% 97%  Weight:      Height:           Constitutional: NAD, calm, comfortable  Vitals:   10/08/16 1445 10/08/16 1515 10/08/16 1530 10/08/16 1615  BP: 147/80 158/86 149/84 156/90  Pulse: 82 74 69 73  Resp: 18 15  20 18   Temp:    98.3 F (36.8 C)  TempSrc:    Oral  SpO2: 98% 97%  97% 97%  Weight:      Height:       Eyes: PERRL, lids and conjunctivae normal ENMT: Mucous membranes are moist. Posterior pharynx clear of any exudate or lesions.Normal dentition.  Neck: normal, supple, no masses, no thyromegaly Respiratory: clear to auscultation bilaterally, no wheezing, no crackles. Normal respiratory effort. No accessory muscle use.  Cardiovascular: Regular rate and rhythm, no murmurs / rubs / gallops. No extremity edema. 2+ pedal pulses. No carotid bruits.  Abdomen: no tenderness, no masses palpated. No hepatosplenomegaly. Bowel sounds positive.  Musculoskeletal: no clubbing / cyanosis. No joint deformity upper and lower extremities. Good ROM, no contractures. Normal muscle tone.  Skin: no rashes, lesions, ulcers.  Neurologic: CN 2-12 grossly intact. Sensation intact, DTR normal. Strength 5/5 in all 4.  Psychiatric: Normal judgment and insight. Alert and oriented x 3. Normal mood.     Labs on Admission: I have personally reviewed following labs and imaging studies  CBC:  Recent Labs Lab 10/06/16 1535 10/08/16 1204  WBC 6.0 5.8  HGB 13.7 14.1  HCT 41.6 42.6  MCV 79.8 78.5  PLT 201 [id number]    Basic Metabolic Panel:  Recent Labs Lab 10/06/16 1535 10/08/16 1204  NA 140 139  K 3.8 4.1  CL 107 108  CO2 27 23  GLUCOSE 159* 114*  BUN 9 10  CREATININE 1.22 1.25*  CALCIUM 8.9 9.6    GFR: Estimated Creatinine Clearance: 90.4 mL/min (by C-G formula based on SCr of 1.25 mg/dL (H)).  Liver Function Tests: No results for input(s): AST, ALT, ALKPHOS, BILITOT, PROT, ALBUMIN in the last 168 hours. No results for input(s): LIPASE, AMYLASE in the last 168 hours. No results for input(s): AMMONIA in the last 168 hours.  Coagulation Profile: No results for input(s): INR, PROTIME in the last 168 hours.  Cardiac Enzymes: No results for input(s): CKTOTAL, CKMB, CKMBINDEX, TROPONINI in the last 168  hours.  BNP (last 3 results) No results for input(s): PROBNP in the last 8760 hours.  HbA1C: No results for input(s): HGBA1C in the last 72 hours.  CBG: No results for input(s): GLUCAP in the last 168 hours.  Lipid Profile: No results for input(s): CHOL, HDL, LDLCALC, TRIG, CHOLHDL, LDLDIRECT in the last 72 hours.  Thyroid Function Tests: No results for input(s): TSH, T4TOTAL, FREET4, T3FREE, THYROIDAB in the last 72 hours.  Anemia Panel: No results for input(s): VITAMINB12, FOLATE, FERRITIN, TIBC, IRON, RETICCTPCT in the last 72 hours.  Urine analysis:    Component Value Date/Time   COLORURINE AMBER (A) 05/06/2016 2159   APPEARANCEUR CLOUDY (A) 05/06/2016 2159   LABSPEC 1.031 (H) 05/06/2016 2159   PHURINE 6.0 05/06/2016 2159   GLUCOSEU NEGATIVE 05/06/2016 2159   GLUCOSEU NEG mg/dL 08/22/2006 1054   HGBUR SMALL (A) 05/06/2016 2159   BILIRUBINUR SMALL (A) 05/06/2016 2159   KETONESUR 15 (A) 05/06/2016 2159   PROTEINUR 30 (A) 05/06/2016 2159   UROBILINOGEN 0.2 05/16/2015 1123   NITRITE NEGATIVE 05/06/2016 2159   LEUKOCYTESUR NEGATIVE 05/06/2016 2159    Sepsis Labs: @LABRCNTIP (procalcitonin:4,lacticidven:4) )No results found for this or any previous visit (from the past 240 hour(s)).   Radiological Exams on Admission: Dg Chest 2 View  Result Date: 10/08/2016 CLINICAL DATA:  Central chest pain radiating to jaw for 4 days. EXAM: CHEST  2 VIEW COMPARISON:  10/06/2016 FINDINGS: Mild peribronchial thickening and interstitial prominence, similar to prior study. Heart is normal size. No confluent opacities or effusions. No acute bony abnormality. IMPRESSION: Mild bronchitic changes, likely  chronic. Electronically Signed   By: Rolm Baptise M.D.   On: 10/08/2016 12:41    EKG: Independently reviewed.  Assessment/Plan Active Problems:   Chest pain   HIV disease (HCC)   Marijuana use   HTN (hypertension)   Achalasia and cardiospasm   Chronic low back pain   Mild sleep  apnea   GERD (gastroesophageal reflux disease)   Dysphagia   AKI (acute kidney injury) (HCC)   Chest pain syndrome, cardiac versus musculoskeletal vs anxiety.  HEART score 4 . Troponin 0.04, EKG TWI He has not received NTG as he was CP free since admission. CXR unrevealing. Never had an echo. Never been see by Cards   Admit to Telemetry/ Observation Chest pain order set Cycle troponins EKG in am continue ASA, O2 and NTG as needed Statins  GI cocktail Check Lipid panel  Hb A1C Consult to Cards is pending, requested as per EDP    Hypertension BP 156/90   Pulse 73   Temp 98.3 F (36.8 C) (Oral)   Resp 18   Ht 6\' 1"  (1.854 m)   Wt 106.1 kg (234 lb)   SpO2 97%   BMI 30.87 kg/m  Continue home anti-hypertensive medications   Acute Kidney Injury likely due to dehydration  BL Cr  1, currently at 1.25  Lab Results  Component Value Date   CREATININE 1.25 (H) 10/08/2016   CREATININE 1.22 10/06/2016   CREATININE 0.96 07/14/2016  CMET in am  Hydrate    GERD, no acute symptoms: Continue PPI  Tobacco abuse without  nicotine withdrawal Wishes to quit, tobacco cessation counseled  Polysubstance abuse, last cocaine 1 week ago  UDS COntinue to monitor for witdrawal symptoms   HIV, continue antiretrovirals   Sleep apnea CPAP at night      DVT prophylaxis:   Heparin   Code Status:   Full    Family Communication:  Discussed with patient Disposition Plan: Expect patient to be discharged to home after condition improves Consults called:    Cardiology  Admission status:Tele  Obs     [facility] E, PA-C [facility]   10/08/2016, 5:39 PM   Patient seen and examined. Case and orders reviewed with P.A Sharene Butters.  Agree with above assessment, plan and orders.   -[employer], D.O.

## 2016-10-08 NOTE — ED Triage Notes (Signed)
Pt presents to ed for evaluation of central chest pain with radiation to jaws intermittently x 4 days. Pt. Seen at [facility] 2 days ago for same. Pt. Denies associated symptoms. Pt states he was given protonix and carafate and has been taking appropriately with no improvement. Denies cardiac hx. Pt. Ambulatory in triage, NAD.

## 2016-10-09 ENCOUNTER — Observation Stay (HOSPITAL_COMMUNITY): Payer: Medicaid Other

## 2016-10-09 ENCOUNTER — Observation Stay (HOSPITAL_BASED_OUTPATIENT_CLINIC_OR_DEPARTMENT_OTHER): Payer: Medicaid Other

## 2016-10-09 DIAGNOSIS — G8929 Other chronic pain: Secondary | ICD-10-CM | POA: Diagnosis present

## 2016-10-09 DIAGNOSIS — I200 Unstable angina: Secondary | ICD-10-CM | POA: Diagnosis not present

## 2016-10-09 DIAGNOSIS — R079 Chest pain, unspecified: Secondary | ICD-10-CM

## 2016-10-09 DIAGNOSIS — E785 Hyperlipidemia, unspecified: Secondary | ICD-10-CM | POA: Diagnosis present

## 2016-10-09 DIAGNOSIS — I2582 Chronic total occlusion of coronary artery: Secondary | ICD-10-CM | POA: Diagnosis present

## 2016-10-09 DIAGNOSIS — F1290 Cannabis use, unspecified, uncomplicated: Secondary | ICD-10-CM | POA: Diagnosis not present

## 2016-10-09 DIAGNOSIS — K219 Gastro-esophageal reflux disease without esophagitis: Secondary | ICD-10-CM | POA: Diagnosis present

## 2016-10-09 DIAGNOSIS — Z79899 Other long term (current) drug therapy: Secondary | ICD-10-CM | POA: Diagnosis not present

## 2016-10-09 DIAGNOSIS — M545 Low back pain: Secondary | ICD-10-CM | POA: Diagnosis present

## 2016-10-09 DIAGNOSIS — E860 Dehydration: Secondary | ICD-10-CM | POA: Diagnosis present

## 2016-10-09 DIAGNOSIS — I208 Other forms of angina pectoris: Secondary | ICD-10-CM | POA: Diagnosis not present

## 2016-10-09 DIAGNOSIS — Z96611 Presence of right artificial shoulder joint: Secondary | ICD-10-CM | POA: Diagnosis present

## 2016-10-09 DIAGNOSIS — F1410 Cocaine abuse, uncomplicated: Secondary | ICD-10-CM | POA: Diagnosis present

## 2016-10-09 DIAGNOSIS — K220 Achalasia of cardia: Secondary | ICD-10-CM | POA: Diagnosis present

## 2016-10-09 DIAGNOSIS — Z955 Presence of coronary angioplasty implant and graft: Secondary | ICD-10-CM | POA: Diagnosis not present

## 2016-10-09 DIAGNOSIS — J45909 Unspecified asthma, uncomplicated: Secondary | ICD-10-CM | POA: Diagnosis present

## 2016-10-09 DIAGNOSIS — G4733 Obstructive sleep apnea (adult) (pediatric): Secondary | ICD-10-CM | POA: Diagnosis present

## 2016-10-09 DIAGNOSIS — F1910 Other psychoactive substance abuse, uncomplicated: Secondary | ICD-10-CM | POA: Diagnosis not present

## 2016-10-09 DIAGNOSIS — I25110 Atherosclerotic heart disease of native coronary artery with unstable angina pectoris: Secondary | ICD-10-CM | POA: Diagnosis present

## 2016-10-09 DIAGNOSIS — F1210 Cannabis abuse, uncomplicated: Secondary | ICD-10-CM | POA: Diagnosis present

## 2016-10-09 DIAGNOSIS — I119 Hypertensive heart disease without heart failure: Secondary | ICD-10-CM | POA: Diagnosis present

## 2016-10-09 DIAGNOSIS — N179 Acute kidney failure, unspecified: Secondary | ICD-10-CM | POA: Diagnosis present

## 2016-10-09 DIAGNOSIS — F17210 Nicotine dependence, cigarettes, uncomplicated: Secondary | ICD-10-CM | POA: Diagnosis present

## 2016-10-09 DIAGNOSIS — B20 Human immunodeficiency virus [HIV] disease: Secondary | ICD-10-CM | POA: Diagnosis not present

## 2016-10-09 DIAGNOSIS — Z21 Asymptomatic human immunodeficiency virus [HIV] infection status: Secondary | ICD-10-CM | POA: Diagnosis present

## 2016-10-09 DIAGNOSIS — R7303 Prediabetes: Secondary | ICD-10-CM | POA: Diagnosis present

## 2016-10-09 DIAGNOSIS — I10 Essential (primary) hypertension: Secondary | ICD-10-CM | POA: Diagnosis not present

## 2016-10-09 DIAGNOSIS — Z8249 Family history of ischemic heart disease and other diseases of the circulatory system: Secondary | ICD-10-CM | POA: Diagnosis not present

## 2016-10-09 DIAGNOSIS — R0789 Other chest pain: Secondary | ICD-10-CM | POA: Diagnosis not present

## 2016-10-09 LAB — COMPREHENSIVE METABOLIC PANEL
ALK PHOS: 67 U/L (ref 38–126)
ALT: 18 U/L (ref 17–63)
ANION GAP: 8 (ref 5–15)
AST: 18 U/L (ref 15–41)
Albumin: 3.5 g/dL (ref 3.5–5.0)
BUN: 12 mg/dL (ref 6–20)
CALCIUM: 9.2 mg/dL (ref 8.9–10.3)
CO2: 26 mmol/L (ref 22–32)
CREATININE: 1.13 mg/dL (ref 0.61–1.24)
Chloride: 105 mmol/L (ref 101–111)
Glucose, Bld: 92 mg/dL (ref 65–99)
Potassium: 4.1 mmol/L (ref 3.5–5.1)
SODIUM: 139 mmol/L (ref 135–145)
TOTAL PROTEIN: 6.6 g/dL (ref 6.5–8.1)
Total Bilirubin: 0.6 mg/dL (ref 0.3–1.2)

## 2016-10-09 LAB — LIPID PANEL
CHOLESTEROL: 203 mg/dL — AB (ref 0–200)
HDL: 37 mg/dL — ABNORMAL LOW (ref >40)
LDL CALC: 130 mg/dL — AB (ref 0–99)
TRIGLYCERIDES: 179 mg/dL — AB (ref <150)
Total CHOL/HDL Ratio: 5.5 RATIO
VLDL: 36 mg/dL (ref 0–40)

## 2016-10-09 LAB — CBC
HCT: 43.3 % (ref 39.0–52.0)
HEMOGLOBIN: 14.1 g/dL (ref 13.0–17.0)
MCH: 25.7 pg — AB (ref 26.0–34.0)
MCHC: 32.6 g/dL (ref 30.0–36.0)
MCV: 78.9 fL (ref 78.0–100.0)
Platelets: 194 10*3/uL (ref 150–400)
RBC: 5.49 MIL/uL (ref 4.22–5.81)
RDW: 14.6 % (ref 11.5–15.5)
WBC: 5.9 10*3/uL (ref 4.0–10.5)

## 2016-10-09 LAB — ECHOCARDIOGRAM COMPLETE
HEIGHTINCHES: 73 in
WEIGHTICAEL: 3724.8 [oz_av]

## 2016-10-09 LAB — NM MYOCAR MULTI W/SPECT W/WALL MOTION / EF
CSEPED: 8 min
CSEPEDS: 17 s
CSEPEW: 10.1 METS
CSEPHR: 170 %
MPHR: 90 {beats}/min
Peak HR: 153 {beats}/min
RPE: 15
Rest HR: 79 {beats}/min

## 2016-10-09 LAB — HEMOGLOBIN A1C
Hgb A1c MFr Bld: 5.9 % — ABNORMAL HIGH (ref 4.8–5.6)
Mean Plasma Glucose: 123 mg/dL

## 2016-10-09 MED ORDER — TECHNETIUM TC 99M TETROFOSMIN IV KIT
10.0000 | PACK | Freq: Once | INTRAVENOUS | Status: AC | PRN
  Administered 2016-10-09: 10 via INTRAVENOUS

## 2016-10-09 MED ORDER — HYDRALAZINE HCL 20 MG/ML IJ SOLN: 10.0000 mg | INTRAMUSCULAR | Status: DC | PRN

## 2016-10-09 MED ORDER — TECHNETIUM TC 99M TETROFOSMIN IV KIT
30.0000 | PACK | Freq: Once | INTRAVENOUS | Status: AC | PRN
  Administered 2016-10-09: 30 via INTRAVENOUS

## 2016-10-09 MED ORDER — AMLODIPINE BESYLATE 5 MG PO TABS
5.0000 mg | ORAL_TABLET | Freq: Every day | ORAL | Status: DC
  Administered 2016-10-09 – 2016-10-12 (×4): 5 mg via ORAL
  Filled 2016-10-09 (×4): qty 1

## 2016-10-09 NOTE — Progress Notes (Signed)
Nutrition Brief Note  Patient identified on the Malnutrition Screening Tool (MST) Report. No significant weight loss noted in review of usual weights below. 8% weight loss within the past year is not significant.  Wt Readings from Last 15 Encounters:  10/09/16 232 lb 12.8 oz (105.6 kg)  10/06/16 224 lb 4 oz (101.7 kg)  07/28/16 226 lb (102.5 kg)  05/14/16 224 lb (101.6 kg)  05/13/16 224 lb (101.6 kg)  05/06/16 230 lb (104.3 kg)  01/07/16 236 lb 9.6 oz (107.3 kg)  10/08/15 251 lb 12.8 oz (114.2 kg)  09/25/15 245 lb (111.1 kg)  09/05/15 250 lb (113.4 kg)  06/16/15 244 lb (110.7 kg)  05/16/15 244 lb (110.7 kg)  04/09/15 246 lb (111.6 kg)  04/07/15 246 lb (111.6 kg)  03/25/15 246 lb 9.6 oz (111.9 kg)    Body mass index is 30.71 kg/m. Patient meets criteria for class 1 obesity based on current BMI.   Current diet order is NPO, was on heart healthy diet yesterday and consumed 100% of supper. Labs and medications reviewed.   No nutrition interventions warranted at this time. If nutrition issues arise, please consult RD.   Molli Barrows, RD, LDN, [place] Pager [phone] After Hours Pager [phone]

## 2016-10-09 NOTE — Progress Notes (Signed)
Patient Name: Benjamin Herrera Date of Encounter: 10/09/2016  Primary Cardiologist: New- [facility] Problem List     Active Problems:   HIV disease [facility])   Marijuana use   HTN (hypertension)   Achalasia and cardiospasm   Chronic low back pain   Mild sleep apnea   GERD (gastroesophageal reflux disease)   Dysphagia   Chest pain   AKI (acute kidney injury) ([place])     Subjective   Seen in nuc med for exercise myoview. Stress ecg with ST depression in II, III and AVF as well as V4-V6. Marland Kitchen He had throat tightness with exercise.   Inpatient Medications    Scheduled Meds: . Darunavir Ethanolate  800 mg Oral QPM  . elvitegravir-cobicistat-emtricitabine-tenofovir  1 tablet Oral QPM  . feeding supplement (ENSURE ENLIVE)  237 mL Oral BID BM  . heparin  5,000 Units Subcutaneous Q8H  . pantoprazole  20 mg Oral Daily  . sucralfate  1 g Oral TID WC & HS   Continuous Infusions:  PRN Meds: acetaminophen, ALPRAZolam, cyclobenzaprine, gi cocktail, morphine injection, ondansetron (ZOFRAN) IV, zolpidem   Vital Signs    Vitals:   10/08/16 2314 10/09/16 0400 10/09/16 0846 10/09/16 0848  BP: 140/88 137/70 (!) 165/102 (!) 154/95  Pulse: 70 (!) 58    Resp:  20    Temp:  98.4 F (36.9 C)    TempSrc:      SpO2:  98%    Weight:  232 lb 12.8 oz (105.6 kg)    Height:        Intake/Output Summary (Last 24 hours) at 10/09/16 0850 Last data filed at 10/09/16 0400  Gross per 24 hour  Intake              340 ml  Output                0 ml  Net              340 ml   Filed Weights   10/08/16 1201 10/09/16 0400  Weight: 234 lb (106.1 kg) 232 lb 12.8 oz (105.6 kg)    Physical Exam   GEN: Well nourished, well developed, in no acute distress.  HEENT: Grossly normal.  Neck: Supple, no JVD, carotid bruits, or masses. Cardiac: RRR, no murmurs, rubs, or gallops. No clubbing, cyanosis, edema.  Radials/DP/PT 2+ and equal bilaterally.  Respiratory:  Respirations regular and unlabored,  clear to auscultation bilaterally. GI: Soft, nontender, nondistended, BS + x 4. MS: no deformity or atrophy. Skin: warm and dry, no rash. Neuro:  Strength and sensation are intact. Psych: AAOx3.  Normal affect.  Labs    CBC  Recent Labs  10/08/16 1204 10/09/16 0206  WBC 5.8 5.9  HGB 14.1 14.1  HCT 42.6 43.3  MCV 78.5 78.9  PLT 183 [id number]   Basic Metabolic Panel  Recent Labs  10/08/16 1204 10/09/16 0206  NA 139 139  K 4.1 4.1  CL 108 105  CO2 23 26  GLUCOSE 114* 92  BUN 10 12  CREATININE 1.25* 1.13  CALCIUM 9.6 9.2   Liver Function Tests  Recent Labs  10/09/16 0206  AST 18  ALT 18  ALKPHOS 67  BILITOT 0.6  PROT 6.6  ALBUMIN 3.5   No results for input(s): LIPASE, AMYLASE in the last 72 hours. Cardiac Enzymes  Recent Labs  10/08/16 1737 10/08/16 2232  TROPONINI <0.03 <0.03   Hemoglobin A1C  Recent Labs  10/08/16 1204  HGBA1C 5.9*   Fasting Lipid Panel  Recent Labs  10/09/16 0206  CHOL 203*  HDL 37*  LDLCALC 130*  TRIG 179*  CHOLHDL 5.5   Thyroid Function Tests No results for input(s): TSH, T4TOTAL, T3FREE, THYROIDAB in the last 72 hours.  Invalid input(s): FREET3  Telemetry    NSR - Personally Reviewed  ECG    NSR with inferior ST/TW changes- Personally Reviewed  Radiology    Dg Chest 2 View  Result Date: 10/08/2016 CLINICAL DATA:  Central chest pain radiating to jaw for 4 days. EXAM: CHEST  2 VIEW COMPARISON:  10/06/2016 FINDINGS: Mild peribronchial thickening and interstitial prominence, similar to prior study. Heart is normal size. No confluent opacities or effusions. No acute bony abnormality. IMPRESSION: Mild bronchitic changes, likely chronic. Electronically Signed   By: Rolm Baptise M.D.   On: 10/08/2016 12:41    Cardiac Studies   Exercise myoview done today- ECG with ST depression, await nuclear images.   Patient Profile     65M with HIV, achalasia, GERD, HTN, sleep apnea, cholelithasis, polysubstance abuse  including cocaine who presented to ER for the second time this week with chest pain and nonspecific EKG changes.  Assessment & Plan    1. Chest pain - mixed atypical/typical features, EKG with nonspecific ST-T changes. Cardiac risk factors include mild HTN, tobacco abuse and family history of early CAD.  -- Troponin neg x3 -- Underwent ETT nuclear stress test today. ECG portion abnormal with ST depression in inferolateral leads. Await nuclear images, but anticipate he will need a heart cath   2. HTN - BP has been uncontrolled. Not on any antihypertensives. Will avoid BB given recent cocaine use. Creat mildly elevated on admission and may need cath so hold off on ACE/ARB for now. Will start amlodipine 5mg  daily and follow   3. Polysubstance abuse - UDS + for cocaine, barbiturates and THC. Counseled on importance of stopping all these drugs as well as tobacco abuse  4. HIV - per internal medicine.  5. HLD- TC: 203, TG 179, HDL 37, LDL 130   Signed, Angelena Form, PA-C  10/09/2016, 8:50 AM   Pt seen.  Agree with above.   He has an ejection fraction of 47% and has inferior changes that were read as consistent with attenuation.  He had an abnormal stress test with ST depression and symptoms and despite no ischemia think he will need a cardiac cath to further evaluate.   Kerry Hough MD [facility] 1:03 PM

## 2016-10-09 NOTE — Progress Notes (Signed)
  Echocardiogram 2D Echocardiogram has been performed.  Benjamin Herrera 10/09/2016, 12:20 PM

## 2016-10-09 NOTE — Progress Notes (Signed)
PROGRESS NOTE                                                                                                                                                                                                             Patient Demographics:    Benjamin Herrera, is a 50 y.o. male, DOB - Oct 05, 1966, VT:[id number]  Admit date - 10/08/2016   Admitting Physician Harvie Bridge, DO  Outpatient Primary MD for the patient is Minerva Ends, MD  LOS - 0  Chief Complaint  Patient presents with  . Chest Pain       Brief Narrative   50 y.o. male with multiple medical issues listed below, including achalasia with cardiospasm, asthma,  GERD HIV, hypertension, sleep apnea, on CPAP, and a history of polysubstance abuse, presenting with complaints of substernal chest pain. Stress test overall low risk, but abnormal EKG, a plan for cardiac cath to further evaluate per cards.   Subjective:    Florestine Avers today has, No headache, Report 1 episode of chest pain overnight No abdominal pain - No Nausea, No new weakness tingling or numbness, No Cough - SOB.    Assessment  & Plan :    Active Problems:   HIV disease ([place])   Marijuana use   HTN (hypertension)   Achalasia and cardiospasm   Chronic low back pain   Mild sleep apnea   GERD (gastroesophageal reflux disease)   Dysphagia   Chest pain   AKI (acute kidney injury) (HCC)  Chest pain syndrome  - HEART score 4 . Troponin 0.04, EKG TWI , cardiology input appreciated, status post nuclear stress test today, EF    estimated 47%, inferior changes consistent with attenuation, but the fact he had abnormal stress test, with ST    depression, and symptoms, so recommendation is for cardiac cath for further evaluation. - continue ASA, O2, statin, when necessary GI cocktail and NTG as needed - 2-D echo EF 60-65%, with no regional wall motion abnormalities   Hypertension  - Remains uncontrolled, Continue home  anti-hypertensive medications , will add when necessary hydralazine   GERD, no acute symptoms: - Continue PPI  Tobacco abuse  - Wishes to quit, tobacco cessation counseled  Polysubstance abuse, last cocaine 1 week ago  UDS for cocaine and THC  HIV, continue antiretrovirals   Sleep apnea CPAP  at night      Code Status : Full  Family Communication  : None at bedside  Disposition Plan  : home when stable  Consults  :  Cardiology  Procedures  : nuclear stress test, 2 D echo  DVT Prophylaxis  :   Heparin - SCDs   Lab Results  Component Value Date   PLT 194 10/09/2016    Antibiotics  :    Anti-infectives    Start     Dose/Rate Route Frequency Ordered Stop   10/08/16 1800  Darunavir Ethanolate (PREZISTA) tablet 800 mg     800 mg Oral Every evening 10/08/16 1603     10/08/16 1800  elvitegravir-cobicistat-emtricitabine-tenofovir (GENVOYA) 150-150-200-10 MG tablet 1 tablet     1 tablet Oral Every evening 10/08/16 1603          Objective:   Vitals:   10/09/16 0858 10/09/16 0900 10/09/16 0902 10/09/16 1150  BP: (!) 212/81 (!) 201/71 (!) 170/72 125/60  Pulse:   82 80  Resp:    17  Temp:   98.2 F (36.8 C) 98.8 F (37.1 C)  TempSrc:   Oral Oral  SpO2:    98%  Weight:      Height:        Wt Readings from Last 3 Encounters:  10/09/16 105.6 kg (232 lb 12.8 oz)  10/06/16 101.7 kg (224 lb 4 oz)  07/28/16 102.5 kg (226 lb)     Intake/Output Summary (Last 24 hours) at 10/09/16 1455 Last data filed at 10/09/16 1102  Gross per 24 hour  Intake              540 ml  Output                0 ml  Net              540 ml     Physical Exam  Awake Alert, Oriented X 3, No new F.N deficits, Normal affect [place].AT,PERRAL Supple Neck,No JVD, No cervical lymphadenopathy appriciated.  Symmetrical Chest wall movement, Good air movement bilaterally, CTAB RRR,No Gallops,Rubs or new Murmurs, No Parasternal Heave +ve B.Sounds, Abd Soft, No tenderness, No rebound -  guarding or rigidity. No Cyanosis, Clubbing or edema, No new Rash or bruise      Data Review:    CBC  Recent Labs Lab 10/06/16 1535 10/08/16 1204 10/09/16 0206  WBC 6.0 5.8 5.9  HGB 13.7 14.1 14.1  HCT 41.6 42.6 43.3  PLT 201 183 194  MCV 79.8 78.5 78.9  MCH 26.3 26.0 25.7*  MCHC 32.9 33.1 32.6  RDW 14.7 14.5 14.6    Chemistries   Recent Labs Lab 10/06/16 1535 10/08/16 1204 10/09/16 0206  NA 140 139 139  K 3.8 4.1 4.1  CL 107 108 105  CO2 27 23 26   GLUCOSE 159* 114* 92  BUN 9 10 12   CREATININE 1.22 1.25* 1.13  CALCIUM 8.9 9.6 9.2  AST  --   --  18  ALT  --   --  18  ALKPHOS  --   --  67  BILITOT  --   --  0.6   ------------------------------------------------------------------------------------------------------------------  Recent Labs  10/09/16 0206  CHOL 203*  HDL 37*  LDLCALC 130*  TRIG 179*  CHOLHDL 5.5    Lab Results  Component Value Date   HGBA1C 5.9 (H) 10/08/2016   ------------------------------------------------------------------------------------------------------------------ No results for input(s): TSH, T4TOTAL, T3FREE, THYROIDAB in the last 72 hours.  Invalid input(s):  FREET3 ------------------------------------------------------------------------------------------------------------------ No results for input(s): VITAMINB12, FOLATE, FERRITIN, TIBC, IRON, RETICCTPCT in the last 72 hours.  Coagulation profile No results for input(s): INR, PROTIME in the last 168 hours.  No results for input(s): DDIMER in the last 72 hours.  Cardiac Enzymes  Recent Labs Lab 10/08/16 1737 10/08/16 2232  TROPONINI <0.03 <0.03   ------------------------------------------------------------------------------------------------------------------ No results found for: BNP  Inpatient Medications  Scheduled Meds: . amLODipine  5 mg Oral Daily  . Darunavir Ethanolate  800 mg Oral QPM  . elvitegravir-cobicistat-emtricitabine-tenofovir  1 tablet  Oral QPM  . heparin  5,000 Units Subcutaneous Q8H  . pantoprazole  20 mg Oral Daily  . sucralfate  1 g Oral TID WC & HS   Continuous Infusions: PRN Meds:.acetaminophen, ALPRAZolam, cyclobenzaprine, gi cocktail, morphine injection, ondansetron (ZOFRAN) IV, zolpidem  Micro Results No results found for this or any previous visit (from the past 240 hour(s)).  Radiology Reports Dg Chest 2 View  Result Date: 10/08/2016 CLINICAL DATA:  Central chest pain radiating to jaw for 4 days. EXAM: CHEST  2 VIEW COMPARISON:  10/06/2016 FINDINGS: Mild peribronchial thickening and interstitial prominence, similar to prior study. Heart is normal size. No confluent opacities or effusions. No acute bony abnormality. IMPRESSION: Mild bronchitic changes, likely chronic. Electronically Signed   By: Rolm Baptise M.D.   On: 10/08/2016 12:41   Dg Chest 2 View  Result Date: 10/06/2016 CLINICAL DATA:  Retrosternal chest pain radiating into the jaw all with tingling in the left arm for the past 3 days. History of asthma, former smoker, gastroesophageal reflux. EXAM: CHEST  2 VIEW COMPARISON:  PA and lateral chest x-ray of July 02, 2011 and PA chest x-ray of May 06, 2016 FINDINGS: The lungs are adequately inflated. The interstitial markings are coarse bilaterally but are stable. There is no alveolar infiltrate. There is no pleural effusion or pneumothorax. The heart and pulmonary vascularity are normal. There is calcification in the wall of the aortic arch. The observed bony thorax exhibits no acute abnormality. 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 IMPRESSION: Mild chronic bronchitic-smoking related changes. No pneumonia, CHF, nor other acute cardiopulmonary abnormality. Thoracic aortic atherosclerosis. Electronically Signed   By: David  Martinique M.D.   On: 10/06/2016 17:10   Nm Myocar Multi W/spect W/wall Motion / Ef  Result Date: 10/09/2016 CLINICAL DATA:  Epigastric and central chest pain. History of asthma,  cocaine use, hypertension and gastroesophageal reflux. EXAM: MYOCARDIAL IMAGING WITH SPECT (REST AND EXERCISE) GATED LEFT VENTRICULAR WALL MOTION STUDY LEFT VENTRICULAR EJECTION FRACTION TECHNIQUE: Standard myocardial SPECT imaging was performed after resting intravenous injection of 10 mCi Tc-41m tetrofosmin. Subsequently, exercise tolerance test was performed by the patient under the supervision of the Cardiology staff. At peak-stress, 30 mCi Tc-57m tetrofosmin was injected intravenously and standard myocardial SPECT imaging was performed. Quantitative gated imaging was also performed to evaluate left ventricular wall motion, and estimate left ventricular ejection fraction. COMPARISON:  None. FINDINGS: Perfusion: The patient achieved stage III of Bruce protocol with maximal heart rate of 153 beats per minute which is above target range for age. Imaging demonstrates no evidence of inducible myocardial ischemia with treadmill exercise. There is evidence of inferior wall attenuation extending into the inferolateral wall on both rest and stress acquisitions. When reviewing raw planar imaging, this appears to be most likely secondary to diaphragmatic attenuation. A component of myocardial scar is not entirely excluded. Wall Motion: No significant left ventricular wall motion abnormalities. No evidence of left ventricular cavity dilatation. Left Ventricular  Ejection Fraction: 47 % End diastolic volume 99991111 ml End systolic volume 61 ml IMPRESSION: 1. No evidence of inducible myocardial ischemia with treadmill exercise. Inferior and inferolateral attenuation is felt to most likely be secondary to diaphragmatic attenuation. 2. Normal left ventricular wall motion. 3. Left ventricular ejection fraction 47% 4. Non invasive risk stratification*: Low *2012 Appropriate Use Criteria for Coronary Revascularization Focused Update: J Am Coll Cardiol. [id number]. http://content.airportbarriers.com.aspx?articleid=1201161  Electronically Signed   By: Aletta Edouard M.D.   On: 10/09/2016 12:36     Messiyah Waterson M.D on 10/09/2016 at 2:55 PM  Between 7am to 7pm - Pager - [phone]  After 7pm go to [email or web address] - password [facility]  [facility] -  Office  [phone]

## 2016-10-10 LAB — CBC
HCT: 43.6 % (ref 39.0–52.0)
Hemoglobin: 14.7 g/dL (ref 13.0–17.0)
MCH: 26.6 pg (ref 26.0–34.0)
MCHC: 33.7 g/dL (ref 30.0–36.0)
MCV: 78.8 fL (ref 78.0–100.0)
PLATELETS: 165 10*3/uL (ref 150–400)
RBC: 5.53 MIL/uL (ref 4.22–5.81)
RDW: 15 % (ref 11.5–15.5)
WBC: 5.8 10*3/uL (ref 4.0–10.5)

## 2016-10-10 LAB — BASIC METABOLIC PANEL
Anion gap: 9 (ref 5–15)
BUN: 11 mg/dL (ref 6–20)
CALCIUM: 9.4 mg/dL (ref 8.9–10.3)
CO2: 23 mmol/L (ref 22–32)
CREATININE: 1.08 mg/dL (ref 0.61–1.24)
Chloride: 104 mmol/L (ref 101–111)
GFR calc Af Amer: >60 mL/min (ref >60)
GLUCOSE: 96 mg/dL (ref 65–99)
POTASSIUM: 4.3 mmol/L (ref 3.5–5.1)
SODIUM: 136 mmol/L (ref 135–145)

## 2016-10-10 MED ORDER — SODIUM CHLORIDE 0.9% FLUSH: 3.0000 mL | INTRAVENOUS | Status: DC | PRN

## 2016-10-10 MED ORDER — SODIUM CHLORIDE 0.9 % WEIGHT BASED INFUSION
1.0000 mL/kg/h | INTRAVENOUS | Status: DC
  Administered 2016-10-11: 1 mL/kg/h via INTRAVENOUS

## 2016-10-10 MED ORDER — SODIUM CHLORIDE 0.9 % WEIGHT BASED INFUSION
3.0000 mL/kg/h | INTRAVENOUS | Status: DC
  Administered 2016-10-11: 3 mL/kg/h via INTRAVENOUS

## 2016-10-10 MED ORDER — ASPIRIN 81 MG PO CHEW
81.0000 mg | CHEWABLE_TABLET | ORAL | Status: AC
  Administered 2016-10-11: 81 mg via ORAL
  Filled 2016-10-10: qty 1

## 2016-10-10 MED ORDER — SODIUM CHLORIDE 0.9 % IV SOLN: 250.0000 mL | INTRAVENOUS | Status: DC | PRN

## 2016-10-10 MED ORDER — SODIUM CHLORIDE 0.9% FLUSH
3.0000 mL | Freq: Two times a day (BID) | INTRAVENOUS | Status: DC
  Administered 2016-10-10 – 2016-10-11 (×3): 3 mL via INTRAVENOUS

## 2016-10-10 NOTE — Plan of Care (Signed)
Problem: Education: Goal: Understanding of cardiac disease, CV risk reduction, and recovery process will improve Outcome: Progressing Provide education r/t cardiac cath procedure

## 2016-10-10 NOTE — Progress Notes (Signed)
Subjective:  He is currently without significant chest pain.  Renal function is normal.  Does have some inferior T-wave changes noted.  Mother had stent put in recently.  Objective:  Vital Signs in the last 24 hours: BP 132/84 (BP Location: Left Arm)   Pulse 75   Temp 98.5 F (36.9 C) (Oral)   Resp 18   Ht 6\' 1"  (1.854 m)   Wt 105 kg (231 lb 8 oz)   SpO2 98%   BMI 30.54 kg/m   Physical Exam: Pleasant mildly obese black male in no acute distress Lungs:  Clear Cardiac:  Regular rhythm, normal S1 and S2, no S3 Extremities:  No edema present  Intake/Output from previous day: 12/16 0701 - 12/17 0700 In: 680 [P.O.:680] Out: -   Weight Filed Weights   10/08/16 1201 10/09/16 0400 10/10/16 0527  Weight: 106.1 kg (234 lb) 105.6 kg (232 lb 12.8 oz) 105 kg (231 lb 8 oz)    Lab Results: Basic Metabolic Panel:  Recent Labs  10/09/16 0206 10/10/16 0330  NA 139 136  K 4.1 4.3  CL 105 104  CO2 26 23  GLUCOSE 92 96  BUN 12 11  CREATININE 1.13 1.08   CBC:  Recent Labs  10/09/16 0206 10/10/16 0330  WBC 5.9 5.8  HGB 14.1 14.7  HCT 43.3 43.6  MCV 78.9 78.8  PLT 194 165   Cardiac Enzymes: Troponin (Point of Care Test)  Recent Labs  10/08/16 1544  TROPIPOC 0.04   Cardiac Panel (last 3 results)  Recent Labs  10/08/16 1737 10/08/16 2232  TROPONINI <0.03 <0.03    Telemetry: Sinus rhythm  Assessment/Plan:  1.  Chest pain concerning for unstable angina pectoris-his myocardial perfusion scan showed an ejection fraction of 47% and had symptoms of throat pain as well as ST depression with exercise.  Catheterization is planned for tomorrow.Cardiac catheterization was discussed with the patient fully including risks of myocardial infarction, death, stroke, bleeding, arrhythmia, dye allergy, renal insufficiency or bleeding.  The patient understands and is willing to proceed.  Possibility of intervention at the same time also discussed with patient and they understand  and are agreeable to proceed 2.  Polysubstance abuse understands need to stop smoking and avoid cocaine and marijuana 3.  HIV positive 4.  Hypertensive heart disease  Recommendations:  Catheterization planned for tomorrow.  Depending on results possible discharge tomorrow night or if stent is necessary home the next day.  Risks discussed as above.     Kerry Hough  MD [facility]  10/10/2016, 8:40 AM

## 2016-10-10 NOTE — Progress Notes (Signed)
PROGRESS NOTE  Benjamin Herrera  [id number] DOB: [birth year]  DOA: 10/08/2016 PCP: Minerva Ends, MD   Brief Narrative:  50 year old male with multiple medical problems including achalasia with cardiospasm, GERD, HIV, HTN, OSA, polysubstance abuse presented to ED on 10/08/16 with complaints of substernal chest pain with radiation to the jaw. He had been seen for this in the ED on 10/06/16. Seen by cardiology. Plan for cardiac cath on 12/18.   Assessment & Plan:   Active Problems:   HIV disease ([place])   Marijuana use   HTN (hypertension)   Achalasia and cardiospasm   Chronic low back pain   Mild sleep apnea   GERD (gastroesophageal reflux disease)   Dysphagia   Chest pain   AKI (acute kidney injury) ([place])   1. Chest pain concerning for unstable angina: Second visit to the ED in a couple of days. Troponin 2 negative. Although treadmill stress test 12/16 showed no evidence of inducible myocardial ischemia, LVEF 47%, he had reproducible chest pain radiating to his throat and ST depression. As per cardiology follow-up, planned for cardiac cath 12/18. No recurrence off chest pain since treadmill stress test on 12/16. 2-D echo: LVEF 60-65 percent. 2. Polysubstance abuse (tobacco, THC, cocaine): Last cocaine use was one week prior to admission. UDS positive for cocaine and THC. Cessation counseled by multiple physicians. Verbalizes understanding. 3. HIV disease: Continue ART and outpatient follow-up with ID 4. Essential hypertension: Controlled. Continue amlodipine. 5. GERD: Continue PPI and sucralfate. 6. OSA: Continue nightly CPAP. 7. Pre diabetes: A1c 5.9. Recommend diet, exercise and weight loss after completing cardiac workup. 8. Hyperlipidemia: LDL 120. Consider statins.   DVT prophylaxis: Heparin Code Status: Full Family Communication: None at bedside. Disposition Plan: Pending cardiac catheterization 12/18   Consultants:   Cardiology 10/09/16: Study Conclusions  -  Left ventricle: The cavity size was normal. Systolic function was   normal. The estimated ejection fraction was in the range of 60%   to 65%. Wall motion was normal; there were no regional wall   motion abnormalities. Left ventricular diastolic function   parameters were normal. - Atrial septum: No defect or patent foramen ovale was identified.  Procedures:   2-D echo  Antimicrobials:   None    Subjective: No recurrence of chest pain since treadmill stress test on 12/16. Denies any other complaints.  Objective:  Vitals:   10/09/16 1150 10/09/16 1622 10/09/16 2000 10/10/16 0527  BP: 125/60 138/73 135/70 132/84  Pulse: 80 74 64 75  Resp: 17 16 19 18   Temp: 98.8 F (37.1 C) 98.3 F (36.8 C) 98.1 F (36.7 C) 98.5 F (36.9 C)  TempSrc: Oral Oral  Oral  SpO2: 98% 97% 98% 98%  Weight:    105 kg (231 lb 8 oz)  Height:        Intake/Output Summary (Last 24 hours) at 10/10/16 1343 Last data filed at 10/09/16 2200  Gross per 24 hour  Intake              480 ml  Output                0 ml  Net              480 ml   Filed Weights   10/08/16 1201 10/09/16 0400 10/10/16 0527  Weight: 106.1 kg (234 lb) 105.6 kg (232 lb 12.8 oz) 105 kg (231 lb 8 oz)    Examination:  General exam: Pleasant young male lying comfortably in  bed. Respiratory system: Clear to auscultation. Respiratory effort normal. Cardiovascular system: S1 & S2 heard, RRR. No JVD, murmurs, rubs, gallops or clicks. No pedal edema. Telemetry: Sinus rhythm. Gastrointestinal system: Abdomen is nondistended, soft and nontender. No organomegaly or masses felt. Normal bowel sounds heard. Central nervous system: Alert and oriented. No focal neurological deficits. Extremities: Symmetric 5 x 5 power. Skin: No rashes, lesions or ulcers Psychiatry: Judgement and insight appear normal. Mood & affect appropriate.     Data Reviewed: I have personally reviewed following labs and imaging studies  CBC:  Recent Labs Lab  10/06/16 1535 10/08/16 1204 10/09/16 0206 10/10/16 0330  WBC 6.0 5.8 5.9 5.8  HGB 13.7 14.1 14.1 14.7  HCT 41.6 42.6 43.3 43.6  MCV 79.8 78.5 78.9 78.8  PLT 201 183 194 [id number]   Basic Metabolic Panel:  Recent Labs Lab 10/06/16 1535 10/08/16 1204 10/09/16 0206 10/10/16 0330  NA 140 139 139 136  K 3.8 4.1 4.1 4.3  CL 107 108 105 104  CO2 27 23 26 23   GLUCOSE 159* 114* 92 96  BUN 9 10 12 11   CREATININE 1.22 1.25* 1.13 1.08  CALCIUM 8.9 9.6 9.2 9.4   GFR: Estimated Creatinine Clearance: 104.1 mL/min (by C-G formula based on SCr of 1.08 mg/dL). Liver Function Tests:  Recent Labs Lab 10/09/16 0206  AST 18  ALT 18  ALKPHOS 67  BILITOT 0.6  PROT 6.6  ALBUMIN 3.5   No results for input(s): LIPASE, AMYLASE in the last 168 hours. No results for input(s): AMMONIA in the last 168 hours. Coagulation Profile: No results for input(s): INR, PROTIME in the last 168 hours. Cardiac Enzymes:  Recent Labs Lab 10/08/16 1737 10/08/16 2232  TROPONINI <0.03 <0.03   BNP (last 3 results) No results for input(s): PROBNP in the last 8760 hours. HbA1C:  Recent Labs  10/08/16 1204  HGBA1C 5.9*   CBG: No results for input(s): GLUCAP in the last 168 hours. Lipid Profile:  Recent Labs  10/09/16 0206  CHOL 203*  HDL 37*  LDLCALC 130*  TRIG 179*  CHOLHDL 5.5   Thyroid Function Tests: No results for input(s): TSH, T4TOTAL, FREET4, T3FREE, THYROIDAB in the last 72 hours. Anemia Panel: No results for input(s): VITAMINB12, FOLATE, FERRITIN, TIBC, IRON, RETICCTPCT in the last 72 hours.  Sepsis Labs: No results for input(s): PROCALCITON, LATICACIDVEN in the last 168 hours.  No results found for this or any previous visit (from the past 240 hour(s)).       Radiology Studies: Nm Myocar Multi W/spect W/wall Motion / Ef  Result Date: 10/09/2016 CLINICAL DATA:  Epigastric and central chest pain. History of asthma, cocaine use, hypertension and gastroesophageal reflux. EXAM:  MYOCARDIAL IMAGING WITH SPECT (REST AND EXERCISE) GATED LEFT VENTRICULAR WALL MOTION STUDY LEFT VENTRICULAR EJECTION FRACTION TECHNIQUE: Standard myocardial SPECT imaging was performed after resting intravenous injection of 10 mCi Tc-96m tetrofosmin. Subsequently, exercise tolerance test was performed by the patient under the supervision of the Cardiology staff. At peak-stress, 30 mCi Tc-76m tetrofosmin was injected intravenously and standard myocardial SPECT imaging was performed. Quantitative gated imaging was also performed to evaluate left ventricular wall motion, and estimate left ventricular ejection fraction. COMPARISON:  None. FINDINGS: Perfusion: The patient achieved stage III of Bruce protocol with maximal heart rate of 153 beats per minute which is above target range for age. Imaging demonstrates no evidence of inducible myocardial ischemia with treadmill exercise. There is evidence of inferior wall attenuation extending into the inferolateral wall on both  rest and stress acquisitions. When reviewing raw planar imaging, this appears to be most likely secondary to diaphragmatic attenuation. A component of myocardial scar is not entirely excluded. Wall Motion: No significant left ventricular wall motion abnormalities. No evidence of left ventricular cavity dilatation. Left Ventricular Ejection Fraction: 47 % End diastolic volume 99991111 ml End systolic volume 61 ml IMPRESSION: 1. No evidence of inducible myocardial ischemia with treadmill exercise. Inferior and inferolateral attenuation is felt to most likely be secondary to diaphragmatic attenuation. 2. Normal left ventricular wall motion. 3. Left ventricular ejection fraction 47% 4. Non invasive risk stratification*: Low *2012 Appropriate Use Criteria for Coronary Revascularization Focused Update: J Am Coll Cardiol. [id number]. http://content.[email or web address].aspx?articleid=1201161 Electronically Signed   By: Aletta Edouard M.D.   On:  10/09/2016 12:36        Scheduled Meds: . amLODipine  5 mg Oral Daily  . [START ON 10/11/2016] aspirin  81 mg Oral Pre-Cath  . Darunavir Ethanolate  800 mg Oral QPM  . elvitegravir-cobicistat-emtricitabine-tenofovir  1 tablet Oral QPM  . heparin  5,000 Units Subcutaneous Q8H  . pantoprazole  20 mg Oral Daily  . sodium chloride flush  3 mL Intravenous Q12H  . sucralfate  1 g Oral TID WC & HS   Continuous Infusions: . [START ON 10/11/2016] sodium chloride     Followed by  . [START ON 10/11/2016] sodium chloride       LOS: 1 day      [facility], MD [facility] Pager [phone]  If 7PM-7AM, please contact night-coverage [email or web address] Password [email or web address] 10/10/2016, 1:43 PM

## 2016-10-11 ENCOUNTER — Encounter (HOSPITAL_COMMUNITY)
Admission: EM | Disposition: A | Discharge status: Home / Self Care | Payer: Self-pay | Source: Home / Self Care | Attending: Internal Medicine

## 2016-10-11 DIAGNOSIS — I10 Essential (primary) hypertension: Secondary | ICD-10-CM

## 2016-10-11 DIAGNOSIS — I200 Unstable angina: Secondary | ICD-10-CM

## 2016-10-11 DIAGNOSIS — I25110 Atherosclerotic heart disease of native coronary artery with unstable angina pectoris: Principal | ICD-10-CM

## 2016-10-11 DIAGNOSIS — F1290 Cannabis use, unspecified, uncomplicated: Secondary | ICD-10-CM

## 2016-10-11 DIAGNOSIS — R079 Chest pain, unspecified: Secondary | ICD-10-CM

## 2016-10-11 DIAGNOSIS — F1910 Other psychoactive substance abuse, uncomplicated: Secondary | ICD-10-CM

## 2016-10-11 HISTORY — PX: CARDIAC CATHETERIZATION: SHX172

## 2016-10-11 LAB — PROTIME-INR
INR: 1.01
Prothrombin Time: 13.3 seconds (ref 11.4–15.2)

## 2016-10-11 LAB — POCT ACTIVATED CLOTTING TIME: Activated Clotting Time: 318 seconds

## 2016-10-11 SURGERY — LEFT HEART CATH AND CORONARY ANGIOGRAPHY
Anesthesia: LOCAL

## 2016-10-11 MED ORDER — HEPARIN SODIUM (PORCINE) 1000 UNIT/ML IJ SOLN
INTRAMUSCULAR | Status: DC | PRN
  Administered 2016-10-11: 5000 [IU] via INTRAVENOUS
  Administered 2016-10-11: 6000 [IU] via INTRAVENOUS

## 2016-10-11 MED ORDER — HEPARIN SODIUM (PORCINE) 1000 UNIT/ML IJ SOLN
INTRAMUSCULAR | Status: AC
  Filled 2016-10-11: qty 1

## 2016-10-11 MED ORDER — CLOPIDOGREL BISULFATE 75 MG PO TABS
75.0000 mg | ORAL_TABLET | Freq: Every day | ORAL | Status: DC
  Administered 2016-10-12: 08:00:00 75 mg via ORAL
  Filled 2016-10-11: qty 1

## 2016-10-11 MED ORDER — VERAPAMIL HCL 2.5 MG/ML IV SOLN
INTRAVENOUS | Status: AC
  Filled 2016-10-11: qty 2

## 2016-10-11 MED ORDER — IOPAMIDOL (ISOVUE-370) INJECTION 76%
INTRAVENOUS | Status: AC
  Filled 2016-10-11: qty 100

## 2016-10-11 MED ORDER — CLOPIDOGREL BISULFATE 300 MG PO TABS
ORAL_TABLET | ORAL | Status: AC
  Filled 2016-10-11: qty 2

## 2016-10-11 MED ORDER — SODIUM CHLORIDE 0.9 % IV SOLN
INTRAVENOUS | Status: AC
  Administered 2016-10-11: 16:00:00 via INTRAVENOUS

## 2016-10-11 MED ORDER — ASPIRIN 81 MG PO CHEW
81.0000 mg | CHEWABLE_TABLET | Freq: Every day | ORAL | Status: DC
  Administered 2016-10-12: 10:00:00 81 mg via ORAL
  Filled 2016-10-11: qty 1

## 2016-10-11 MED ORDER — HYDRALAZINE HCL 20 MG/ML IJ SOLN: 5.0000 mg | INTRAMUSCULAR | Status: AC | PRN

## 2016-10-11 MED ORDER — HEPARIN (PORCINE) IN NACL 2-0.9 UNIT/ML-% IJ SOLN
INTRAMUSCULAR | Status: AC
  Filled 2016-10-11: qty 1000

## 2016-10-11 MED ORDER — FENTANYL CITRATE (PF) 100 MCG/2ML IJ SOLN
INTRAMUSCULAR | Status: AC
  Filled 2016-10-11: qty 2

## 2016-10-11 MED ORDER — IOPAMIDOL (ISOVUE-370) INJECTION 76%
INTRAVENOUS | Status: DC | PRN
  Administered 2016-10-11: 130 mL via INTRA_ARTERIAL

## 2016-10-11 MED ORDER — VERAPAMIL HCL 2.5 MG/ML IV SOLN
INTRAVENOUS | Status: DC | PRN
  Administered 2016-10-11: 10 mL via INTRA_ARTERIAL

## 2016-10-11 MED ORDER — MIDAZOLAM HCL 2 MG/2ML IJ SOLN
INTRAMUSCULAR | Status: DC | PRN
  Administered 2016-10-11: 2 mg via INTRAVENOUS
  Administered 2016-10-11 (×2): 1 mg via INTRAVENOUS

## 2016-10-11 MED ORDER — LIDOCAINE HCL (PF) 1 % IJ SOLN
INTRAMUSCULAR | Status: AC
  Filled 2016-10-11: qty 30

## 2016-10-11 MED ORDER — SODIUM CHLORIDE 0.9 % IV SOLN: 250.0000 mL | INTRAVENOUS | Status: DC | PRN

## 2016-10-11 MED ORDER — SODIUM CHLORIDE 0.9% FLUSH: 3.0000 mL | Freq: Two times a day (BID) | INTRAVENOUS | Status: DC

## 2016-10-11 MED ORDER — LIDOCAINE HCL (PF) 1 % IJ SOLN
INTRAMUSCULAR | Status: DC | PRN
  Administered 2016-10-11: 2 mL via INTRADERMAL

## 2016-10-11 MED ORDER — CLOPIDOGREL BISULFATE 300 MG PO TABS
ORAL_TABLET | ORAL | Status: DC | PRN
  Administered 2016-10-11: 600 mg via ORAL

## 2016-10-11 MED ORDER — MIDAZOLAM HCL 2 MG/2ML IJ SOLN
INTRAMUSCULAR | Status: AC
  Filled 2016-10-11: qty 2

## 2016-10-11 MED ORDER — HEPARIN (PORCINE) IN NACL 2-0.9 UNIT/ML-% IJ SOLN
INTRAMUSCULAR | Status: DC | PRN
  Administered 2016-10-11: 1000 mL

## 2016-10-11 MED ORDER — ANGIOPLASTY BOOK
Freq: Once | Status: AC
  Administered 2016-10-11: 21:00:00
  Filled 2016-10-11: qty 1

## 2016-10-11 MED ORDER — LABETALOL HCL 5 MG/ML IV SOLN: 10.0000 mg | INTRAVENOUS | Status: AC | PRN

## 2016-10-11 MED ORDER — SODIUM CHLORIDE 0.9% FLUSH: 3.0000 mL | INTRAVENOUS | Status: DC | PRN

## 2016-10-11 MED ORDER — FENTANYL CITRATE (PF) 100 MCG/2ML IJ SOLN
INTRAMUSCULAR | Status: DC | PRN
  Administered 2016-10-11: 50 ug via INTRAVENOUS
  Administered 2016-10-11 (×2): 25 ug via INTRAVENOUS
  Administered 2016-10-11: 50 ug via INTRAVENOUS

## 2016-10-11 SURGICAL SUPPLY — 18 items
BALLN EMERGE MR 2.5X12 (BALLOONS) ×2
BALLN ~~LOC~~ EMERGE MR 3.75X12 (BALLOONS) ×2
BALLOON EMERGE MR 2.5X12 (BALLOONS) ×1 IMPLANT
BALLOON ~~LOC~~ EMERGE MR 3.75X12 (BALLOONS) ×1 IMPLANT
CATH 5FR JL3.5 JR4 ANG PIG MP (CATHETERS) ×2 IMPLANT
CATH VISTA GUIDE 6FR XB3 (CATHETERS) ×2 IMPLANT
CATH VISTA GUIDE 6FR XB3.5 (CATHETERS) ×2 IMPLANT
DEVICE RAD COMP TR BAND LRG (VASCULAR PRODUCTS) ×2 IMPLANT
GLIDESHEATH SLEND SS 6F .021 (SHEATH) ×2 IMPLANT
GUIDEWIRE INQWIRE 1.5J.035X260 (WIRE) ×1 IMPLANT
INQWIRE 1.5J .035X260CM (WIRE) ×2
KIT ENCORE 26 ADVANTAGE (KITS) ×2 IMPLANT
KIT HEART LEFT (KITS) ×2 IMPLANT
PACK CARDIAC CATHETERIZATION (CUSTOM PROCEDURE TRAY) ×2 IMPLANT
STENT RESOLUTE ONYX 3.5X15 (Permanent Stent) ×2 IMPLANT
TRANSDUCER W/STOPCOCK (MISCELLANEOUS) ×2 IMPLANT
TUBING CIL FLEX 10 FLL-RA (TUBING) ×2 IMPLANT
WIRE COUGAR XT STRL 190CM (WIRE) ×2 IMPLANT

## 2016-10-11 NOTE — Interval H&P Note (Signed)
History and Physical Interval Note:  10/11/2016 2:09 PM  Benjamin Herrera  has presented today for cardiac cath with the diagnosis of unstable angina  The various methods of treatment have been discussed with the patient and family. After consideration of risks, benefits and other options for treatment, the patient has consented to  Procedure(s): Left Heart Cath and Coronary Angiography (N/A) as a surgical intervention .  The patient's history has been reviewed, patient examined, no change in status, stable for surgery.  I have reviewed the patient's chart and labs.  Questions were answered to the patient's satisfaction.    Cath Lab Visit (complete for each Cath Lab visit)  Clinical Evaluation Leading to the Procedure:   ACS: No.  Non-ACS:    Anginal Classification: CCS III  Anti-ischemic medical therapy: None  Non-Invasive Test Results: Low-risk stress test findings: cardiac mortality <1%/year  Prior CABG: No previous CABG         Lauree Chandler

## 2016-10-11 NOTE — Progress Notes (Signed)
PROGRESS NOTE  Benjamin Herrera  [id number] DOB: [birth year]  DOA: 10/08/2016 PCP: Minerva Ends, MD   Brief Narrative:  50 year old male with multiple medical problems including achalasia with cardiospasm, GERD, HIV, HTN, OSA, polysubstance abuse presented to ED on 10/08/16 with complaints of substernal chest pain with radiation to the jaw. He had been seen for this in the ED on 10/06/16. Seen by cardiology. Plan for cardiac cath on 12/18.   Assessment & Plan:   Active Problems:   HIV disease ([place])   Marijuana use   HTN (hypertension)   Achalasia and cardiospasm   Chronic low back pain   Mild sleep apnea   GERD (gastroesophageal reflux disease)   Dysphagia   Chest pain   AKI (acute kidney injury) ([place])   1. Chest pain concerning for unstable angina: Second visit to the ED in a couple of days. Troponin 2 negative. Although treadmill stress test 12/16 showed no evidence of inducible myocardial ischemia, LVEF 47%, he had reproducible chest pain radiating to his throat and ST depression. As per cardiology follow-up, planned for cardiac cath and possibility of intervention at the same time on 12/18. No recurrence off chest pain since treadmill stress test on 12/16. 2-D echo: LVEF 60-65 percent. 2. Polysubstance abuse (tobacco, THC, cocaine): Last cocaine use was one week prior to admission. UDS positive for cocaine, barbiturates and THC. Cessation counseled by multiple physicians. Verbalizes understanding. 3. HIV disease: Continue ART and outpatient follow-up with ID 4. Essential hypertension: Controlled. Continue amlodipine. 5. GERD: Continue PPI and sucralfate. 6. OSA: Continue nightly CPAP. 7. Pre diabetes: A1c 5.9. Recommend diet, exercise and weight loss after completing cardiac workup. 8. Hyperlipidemia: LDL 130. Consider statins.   DVT prophylaxis: Heparin Code Status: Full Family Communication: None at bedside. Disposition Plan: Pending cardiac catheterization  12/18   Consultants:   Cardiology 10/09/16: Study Conclusions  - Left ventricle: The cavity size was normal. Systolic function was   normal. The estimated ejection fraction was in the range of 60%   to 65%. Wall motion was normal; there were no regional wall   motion abnormalities. Left ventricular diastolic function   parameters were normal. - Atrial septum: No defect or patent foramen ovale was identified.  Procedures:   2-D echo  Antimicrobials:   None    Subjective: No recurrence of chest pain since treadmill stress test on 12/16. Denies any other complaints.  Objective:  Vitals:   10/10/16 1432 10/10/16 2100 10/11/16 0509 10/11/16 0836  BP: 135/77 122/70 (!) 118/44 134/83  Pulse: 80 77 77   Resp: 16 17 18    Temp: 98.4 F (36.9 C) 98.2 F (36.8 C) 98.1 F (36.7 C)   TempSrc: Oral     SpO2: 95% 97% 97%   Weight:   104.7 kg (230 lb 14.4 oz)   Height:        Intake/Output Summary (Last 24 hours) at 10/11/16 1126 Last data filed at 10/11/16 1017  Gross per 24 hour  Intake              660 ml  Output              400 ml  Net              260 ml   Filed Weights   10/09/16 0400 10/10/16 0527 10/11/16 0509  Weight: 105.6 kg (232 lb 12.8 oz) 105 kg (231 lb 8 oz) 104.7 kg (230 lb 14.4 oz)    Examination:  General exam: Pleasant young male lying comfortably in bed. Respiratory system: Clear to auscultation. Respiratory effort normal. Cardiovascular system: S1 & S2 heard, RRR. No JVD, murmurs, rubs, gallops or clicks. No pedal edema. Telemetry: Sinus rhythm. Gastrointestinal system: Abdomen is nondistended, soft and nontender. No organomegaly or masses felt. Normal bowel sounds heard. Central nervous system: Alert and oriented. No focal neurological deficits. Extremities: Symmetric 5 x 5 power. Skin: No rashes, lesions or ulcers Psychiatry: Judgement and insight appear normal. Mood & affect appropriate.     Data Reviewed: I have personally reviewed  following labs and imaging studies  CBC:  Recent Labs Lab 10/06/16 1535 10/08/16 1204 10/09/16 0206 10/10/16 0330  WBC 6.0 5.8 5.9 5.8  HGB 13.7 14.1 14.1 14.7  HCT 41.6 42.6 43.3 43.6  MCV 79.8 78.5 78.9 78.8  PLT 201 183 194 [id number]   Basic Metabolic Panel:  Recent Labs Lab 10/06/16 1535 10/08/16 1204 10/09/16 0206 10/10/16 0330  NA 140 139 139 136  K 3.8 4.1 4.1 4.3  CL 107 108 105 104  CO2 27 23 26 23   GLUCOSE 159* 114* 92 96  BUN 9 10 12 11   CREATININE 1.22 1.25* 1.13 1.08  CALCIUM 8.9 9.6 9.2 9.4   GFR: Estimated Creatinine Clearance: 103.9 mL/min (by C-G formula based on SCr of 1.08 mg/dL). Liver Function Tests:  Recent Labs Lab 10/09/16 0206  AST 18  ALT 18  ALKPHOS 67  BILITOT 0.6  PROT 6.6  ALBUMIN 3.5   No results for input(s): LIPASE, AMYLASE in the last 168 hours. No results for input(s): AMMONIA in the last 168 hours. Coagulation Profile:  Recent Labs Lab 10/11/16 0244  INR 1.01   Cardiac Enzymes:  Recent Labs Lab 10/08/16 1737 10/08/16 2232  TROPONINI <0.03 <0.03   BNP (last 3 results) No results for input(s): PROBNP in the last 8760 hours. HbA1C:  Recent Labs  10/08/16 1204  HGBA1C 5.9*   CBG: No results for input(s): GLUCAP in the last 168 hours. Lipid Profile:  Recent Labs  10/09/16 0206  CHOL 203*  HDL 37*  LDLCALC 130*  TRIG 179*  CHOLHDL 5.5   Thyroid Function Tests: No results for input(s): TSH, T4TOTAL, FREET4, T3FREE, THYROIDAB in the last 72 hours. Anemia Panel: No results for input(s): VITAMINB12, FOLATE, FERRITIN, TIBC, IRON, RETICCTPCT in the last 72 hours.  Sepsis Labs: No results for input(s): PROCALCITON, LATICACIDVEN in the last 168 hours.  No results found for this or any previous visit (from the past 240 hour(s)).       Radiology Studies: No results found.      Scheduled Meds: . amLODipine  5 mg Oral Daily  . Darunavir Ethanolate  800 mg Oral QPM  .  elvitegravir-cobicistat-emtricitabine-tenofovir  1 tablet Oral QPM  . heparin  5,000 Units Subcutaneous Q8H  . pantoprazole  20 mg Oral Daily  . sodium chloride flush  3 mL Intravenous Q12H  . sucralfate  1 g Oral TID WC & HS   Continuous Infusions: . sodium chloride 1 mL/kg/hr (10/11/16 1125)     LOS: 2 days      [facility], MD [facility] Pager [phone]  If 7PM-7AM, please contact night-coverage [email or web address] Password [email or web address] 10/11/2016, 11:26 AM

## 2016-10-11 NOTE — Progress Notes (Signed)
Subjective:  No chest pain this am   Objective:  Vital Signs in the last 24 hours: BP (!) 118/44   Pulse 77   Temp 98.1 F (36.7 C)   Resp 18   Ht 6\' 1"  (1.854 m)   Wt 230 lb 14.4 oz (104.7 kg)   SpO2 97%   BMI 30.46 kg/m   Physical Exam: Pleasant mildly obese black male in no acute distress Lungs:  Clear Cardiac:  Regular rhythm, normal S1 and S2, no S3 Extremities:  No edema present  Intake/Output from previous day: 12/17 0701 - 12/18 0700 In: 660 [P.O.:342; I.V.:315] Out: 250 [Urine:250]  Weight Filed Weights   10/09/16 0400 10/10/16 0527 10/11/16 0509  Weight: 232 lb 12.8 oz (105.6 kg) 231 lb 8 oz (105 kg) 230 lb 14.4 oz (104.7 kg)    Lab Results: Basic Metabolic Panel:  Recent Labs  10/09/16 0206 10/10/16 0330  NA 139 136  K 4.1 4.3  CL 105 104  CO2 26 23  GLUCOSE 92 96  BUN 12 11  CREATININE 1.13 1.08   CBC:  Recent Labs  10/09/16 0206 10/10/16 0330  WBC 5.9 5.8  HGB 14.1 14.7  HCT 43.3 43.6  MCV 78.9 78.8  PLT 194 165   Cardiac Enzymes: Troponin (Point of Care Test)  Recent Labs  10/08/16 1544  TROPIPOC 0.04   Cardiac Panel (last 3 results)  Recent Labs  10/08/16 1737 10/08/16 2232  TROPONINI <0.03 <0.03    Telemetry: Sinus rhythm  Assessment/Plan:  1.  Chest pain concerning for unstable angina pectoris-his myocardial perfusion scan showed an ejection fraction of 47% and had symptoms of throat pain as well as ST depression with exercise.  Catheterization is planned for tomorrow.Cardiac catheterization was discussed with the patient fully including risks of myocardial infarction, death, stroke, bleeding, arrhythmia, dye allergy, renal insufficiency or bleeding.  The patient understands and is willing to proceed.  Possibility of intervention at the same time also discussed with patient and they understand and are agreeable to proceed 2.  Polysubstance abuse understands need to stop smoking and avoid cocaine and marijuana 3.  HIV  positive f/u ID follow CD 4 4.  Hypertensive heart disease Well controlled.  Continue current medications and low sodium Dash type diet.     Recommendations:  Cath latter today with Dr End   [name]

## 2016-10-11 NOTE — H&P (View-Only) (Signed)
Subjective:  No chest pain this am   Objective:  Vital Signs in the last 24 hours: BP (!) 118/44   Pulse 77   Temp 98.1 F (36.7 C)   Resp 18   Ht 6\' 1"  (1.854 m)   Wt 230 lb 14.4 oz (104.7 kg)   SpO2 97%   BMI 30.46 kg/m   Physical Exam: Pleasant mildly obese black male in no acute distress Lungs:  Clear Cardiac:  Regular rhythm, normal S1 and S2, no S3 Extremities:  No edema present  Intake/Output from previous day: 12/17 0701 - 12/18 0700 In: 660 [P.O.:342; I.V.:315] Out: 250 [Urine:250]  Weight Filed Weights   10/09/16 0400 10/10/16 0527 10/11/16 0509  Weight: 232 lb 12.8 oz (105.6 kg) 231 lb 8 oz (105 kg) 230 lb 14.4 oz (104.7 kg)    Lab Results: Basic Metabolic Panel:  Recent Labs  10/09/16 0206 10/10/16 0330  NA 139 136  K 4.1 4.3  CL 105 104  CO2 26 23  GLUCOSE 92 96  BUN 12 11  CREATININE 1.13 1.08   CBC:  Recent Labs  10/09/16 0206 10/10/16 0330  WBC 5.9 5.8  HGB 14.1 14.7  HCT 43.3 43.6  MCV 78.9 78.8  PLT 194 165   Cardiac Enzymes: Troponin (Point of Care Test)  Recent Labs  10/08/16 1544  TROPIPOC 0.04   Cardiac Panel (last 3 results)  Recent Labs  10/08/16 1737 10/08/16 2232  TROPONINI <0.03 <0.03    Telemetry: Sinus rhythm  Assessment/Plan:  1.  Chest pain concerning for unstable angina pectoris-his myocardial perfusion scan showed an ejection fraction of 47% and had symptoms of throat pain as well as ST depression with exercise.  Catheterization is planned for tomorrow.Cardiac catheterization was discussed with the patient fully including risks of myocardial infarction, death, stroke, bleeding, arrhythmia, dye allergy, renal insufficiency or bleeding.  The patient understands and is willing to proceed.  Possibility of intervention at the same time also discussed with patient and they understand and are agreeable to proceed 2.  Polysubstance abuse understands need to stop smoking and avoid cocaine and marijuana 3.  HIV  positive f/u ID follow CD 4 4.  Hypertensive heart disease Well controlled.  Continue current medications and low sodium Dash type diet.     Recommendations:  Cath latter today with Dr End   [name]

## 2016-10-11 NOTE — Progress Notes (Signed)
RN offered to set patient up with the cardiac cath/PCI education video, patient stated he did not want to watch it.

## 2016-10-12 ENCOUNTER — Encounter (HOSPITAL_COMMUNITY): Payer: Self-pay | Admitting: Cardiovascular Disease

## 2016-10-12 ENCOUNTER — Ambulatory Visit: Payer: Medicaid Other | Admitting: Gastroenterology

## 2016-10-12 ENCOUNTER — Telehealth: Payer: Self-pay | Admitting: Gastroenterology

## 2016-10-12 DIAGNOSIS — I200 Unstable angina: Secondary | ICD-10-CM

## 2016-10-12 DIAGNOSIS — F1910 Other psychoactive substance abuse, uncomplicated: Secondary | ICD-10-CM

## 2016-10-12 LAB — BASIC METABOLIC PANEL
Anion gap: 6 (ref 5–15)
BUN: 13 mg/dL (ref 6–20)
CHLORIDE: 101 mmol/L (ref 101–111)
CO2: 28 mmol/L (ref 22–32)
Calcium: 9 mg/dL (ref 8.9–10.3)
Creatinine, Ser: 1.24 mg/dL (ref 0.61–1.24)
GFR calc non Af Amer: >60 mL/min (ref >60)
Glucose, Bld: 91 mg/dL (ref 65–99)
POTASSIUM: 4.1 mmol/L (ref 3.5–5.1)
SODIUM: 135 mmol/L (ref 135–145)

## 2016-10-12 LAB — CBC
HEMATOCRIT: 42.2 % (ref 39.0–52.0)
Hemoglobin: 14.2 g/dL (ref 13.0–17.0)
MCH: 26.2 pg (ref 26.0–34.0)
MCHC: 33.6 g/dL (ref 30.0–36.0)
MCV: 77.9 fL — AB (ref 78.0–100.0)
Platelets: 171 10*3/uL (ref 150–400)
RBC: 5.42 MIL/uL (ref 4.22–5.81)
RDW: 14.6 % (ref 11.5–15.5)
WBC: 5.3 10*3/uL (ref 4.0–10.5)

## 2016-10-12 MED ORDER — CLOPIDOGREL BISULFATE 75 MG PO TABS: 75.0000 mg | ORAL_TABLET | Freq: Every day | ORAL | 0 refills | Status: DC

## 2016-10-12 MED ORDER — ATORVASTATIN CALCIUM 40 MG PO TABS: 40.0000 mg | ORAL_TABLET | Freq: Every day | ORAL | 0 refills | Status: DC

## 2016-10-12 MED ORDER — ASPIRIN 81 MG PO CHEW: 81.0000 mg | CHEWABLE_TABLET | Freq: Every day | ORAL | 0 refills | Status: DC

## 2016-10-12 MED ORDER — ZOLPIDEM TARTRATE 5 MG PO TABS: 5.0000 mg | ORAL_TABLET | Freq: Every evening | ORAL | 0 refills | Status: DC | PRN

## 2016-10-12 MED ORDER — AMLODIPINE BESYLATE 5 MG PO TABS: 5.0000 mg | ORAL_TABLET | Freq: Every day | ORAL | 0 refills | Status: DC

## 2016-10-12 MED ORDER — NITROGLYCERIN 0.4 MG SL SUBL: 0.4000 mg | SUBLINGUAL_TABLET | SUBLINGUAL | Status: DC | PRN

## 2016-10-12 NOTE — Progress Notes (Signed)
Subjective:  No chest pain this am   Objective:  Vital Signs in the last 24 hours: BP 112/65 (BP Location: Left Arm)   Pulse 75   Temp 98.1 F (36.7 C) (Oral)   Resp 19   Ht 6\' 1"  (1.854 m)   Wt 217 lb 9.5 oz (98.7 kg)   SpO2 96%   BMI 28.71 kg/m   Physical Exam: Pleasant mildly obese black male in no acute distress Lungs:  Clear Cardiac:  Regular rhythm, normal S1 and S2, no S3 Extremities:  No edema present  Intake/Output from previous day: 12/18 0701 - 12/19 0700 In: 1657.5 [P.O.:1320; I.V.:337.5] Out: 1750 [Urine:1750]  Weight Filed Weights   10/10/16 0527 10/11/16 0509 10/12/16 0218  Weight: 231 lb 8 oz (105 kg) 230 lb 14.4 oz (104.7 kg) 217 lb 9.5 oz (98.7 kg)    Lab Results: Basic Metabolic Panel:  Recent Labs  10/10/16 0330 10/12/16 0205  NA 136 135  K 4.3 4.1  CL 104 101  CO2 23 28  GLUCOSE 96 91  BUN 11 13  CREATININE 1.08 1.24   CBC:  Recent Labs  10/10/16 0330 10/12/16 0205  WBC 5.8 5.3  HGB 14.7 14.2  HCT 43.6 42.2  MCV 78.8 77.9*  PLT 165 171   Cardiac Enzymes: Troponin (Point of Care Test) No results for input(s): TROPIPOC in the last 72 hours. Cardiac Panel (last 3 results) No results for input(s): CKTOTAL, CKMB, TROPONINI, RELINDX in the last 72 hours.  Telemetry: Sinus rhythm  Assessment/Plan:  1.  Chest pain Total RCA with collaterals with stenting of OM1 this admission DAT  2.  Polysubstance abuse understands need to stop smoking and avoid cocaine and marijuana 3.  HIV positive f/u ID follow CD 4 4.  Hypertensive heart disease Well controlled.  Continue current medications and low sodium Dash type diet.     Will arrange outpatient f/u with Benjamin Herrera ok to d/c home will need script for plavix    Benjamin Herrera  Patient ID: Benjamin Herrera, male   DOB: 12-21-1965, 50 y.o.   MRN: [id number]

## 2016-10-12 NOTE — Progress Notes (Signed)
CARDIAC REHAB PHASE I   PRE:  Rate/Rhythm: 77 SR  BP:  Sitting: 133/58        SaO2: 96 RA  MODE:  Ambulation: 350 ft   POST:  Rate/Rhythm: 92 SR  BP:  Sitting: 149/74         SaO2: 98 RA  Pt ambulated 350 ft on RA, handheld assist, steady gait, tolerated well.  Pt c/o mild DOE, denies cp, dizziness, declined rest stop. Completed PCI/stent education.  Reviewed risk factors, tobacco/polysubstance cessation (gave pt fake cigarette), PCI book, anti-platelet therapy, stent card, activity restrictions, ntg, exercise, heart healthy diet, carb counting, portion control and phase 2 cardiac rehab. Pt verbalized understanding, receptive to education. Pt agrees to phase 2 cardiac rehab referral, will send to [facility] per pt request. Pt to bed per pt request after walk, call bell within reach.   [place], RN, BSN 10/12/2016 9:28 AM

## 2016-10-12 NOTE — Progress Notes (Signed)
TR BAND REMOVAL  LOCATION:    right radial  DEFLATED PER PROTOCOL:    Yes.    TIME BAND OFF / DRESSING APPLIED:    19:30   SITE UPON ARRIVAL:    Level 0  SITE AFTER BAND REMOVAL:    Level 0  CIRCULATION SENSATION AND MOVEMENT:    Within Normal Limits   Yes.    COMMENTS:   Post TR band instructions given. Pt tolerated well. 

## 2016-10-12 NOTE — Discharge Summary (Signed)
Benjamin Herrera, is a 50 y.o. male  DOB 08/04/1966  MRN [id number].  Admission date:  10/08/2016  Admitting Physician  Harvie Bridge, DO  Discharge Date:  10/12/2016   Primary MD  Minerva Ends, MD  Recommendations for primary care physician for things to follow:  - Continue counseling about tobacco cessation, and polysubstance abuse - Patient to follow with cardiology as outpatient    Admission Diagnosis  Chest pain, unspecified type [R07.9]   Discharge Diagnosis  Chest pain, unspecified type [R07.9]    Active Problems:   HIV disease ([place])   Marijuana use   HTN (hypertension)   Achalasia and cardiospasm   Chronic low back pain   Mild sleep apnea   GERD (gastroesophageal reflux disease)   Dysphagia   Chest pain   AKI (acute kidney injury) ([place])   Polysubstance abuse   Unstable angina ([place])      Past Medical History:  Diagnosis Date  . Achalasia and cardiospasm   . Allergy   . Arthritis    back ,shoulders   . Asthma   . Boils    under arms hx of  . Candidiasis of mouth   . Candidiasis of unspecified site   . Cholelithiasis   . Cocaine abuse   . Condyloma acuminatum in male of scrotum & anal canal s/p laser ablation 07/03/2012  . Dysphagia   . Fracture, humerus   . GERD (gastroesophageal reflux disease)   . Hemorrhoids   . Hepatitis 1993   A  . Herpes labialis   . HIV (human immunodeficiency virus infection) ([place]) [place]  . Hypertension Dx 2014   off all bp meds for last 9 or 10 months  . Pilonidal disease 01/10/2012  . Polysubstance abuse   . Psoriasis    hx of  . Sleep apnea    does  have CPAP  . 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 BALLOON DILATION N/A 11/11/2015   Procedure:  BALLOON DILATION;  Surgeon: Mauri Pole, MD;  [facility];  Service: Endoscopy;  Laterality: N/A;  . BOTOX INJECTION  10/11/2012   Procedure: BOTOX INJECTION;  Surgeon: Inda Castle, MD;  [facility];  Service: Endoscopy;  Laterality: N/A;  . BOTOX INJECTION N/A 01/25/2013   Procedure: BOTOX INJECTION;  Surgeon: Inda Castle, MD;  [facility];  Service: Endoscopy;  Laterality: N/A;  . BOTOX INJECTION N/A 06/21/2013   Procedure: BOTOX INJECTION;  Surgeon: Inda Castle, MD;  [facility];  Service: Endoscopy;  Laterality: N/A;  . BOTOX INJECTION N/A 10/08/2013   Procedure: BOTOX INJECTION;  Surgeon: Inda Castle, MD;  [facility];  Service: Endoscopy;  Laterality: N/A;  . BOTOX INJECTION N/A 06/16/2015   Procedure: BOTOX INJECTION;  Surgeon: Inda Castle, MD;  [facility];  Service:  Endoscopy;  Laterality: N/A;  . CARDIAC CATHETERIZATION N/A 10/11/2016   Procedure: Left Heart Cath and Coronary Angiography;  Surgeon: Burnell Blanks, MD;  Location: [facility];  Service: Cardiovascular;  Laterality: N/A;  . CARDIAC CATHETERIZATION N/A 10/11/2016   Procedure: Coronary Stent Intervention;  Surgeon: Burnell Blanks, MD;  Location: [facility];  Service: Cardiovascular;  Laterality: N/A;  . COLONOSCOPY  05/19/2012   Procedure: COLONOSCOPY;  Surgeon: Inda Castle, MD;  [facility];  Service: Endoscopy;  Laterality: N/A;  jill trying to contact pt to come in 0830 for 930 case, phone not accepting messages  . COLONOSCOPY    . ESOPHAGEAL MANOMETRY  05/29/2012   Procedure: ESOPHAGEAL MANOMETRY (EM);  Surgeon: Inda Castle, MD;  [facility];  Service: Endoscopy;  Laterality: N/A;  . ESOPHAGEAL MANOMETRY N/A 10/06/2015   Procedure: ESOPHAGEAL MANOMETRY (EM);  Surgeon: Mauri Pole, MD;  [facility];  Service: Endoscopy;  Laterality: N/A;  .  ESOPHAGOGASTRODUODENOSCOPY  11/11/2011   Procedure: ESOPHAGOGASTRODUODENOSCOPY (EGD);  Surgeon: Inda Castle, MD;  Location: [facility];  Service: Endoscopy;  Laterality: N/A;  botox injection  called Pt to change time of procedure per Dr Deatra Ina  . ESOPHAGOGASTRODUODENOSCOPY  03/10/2012   Procedure: ESOPHAGOGASTRODUODENOSCOPY (EGD);  Surgeon: Inda Castle, MD;  Location: [facility];  Service: Endoscopy;  Laterality: N/A;  . ESOPHAGOGASTRODUODENOSCOPY  10/11/2012   Procedure: ESOPHAGOGASTRODUODENOSCOPY (EGD);  Surgeon: Inda Castle, MD;  Location: [facility];  Service: Endoscopy;  Laterality: N/A;  . ESOPHAGOGASTRODUODENOSCOPY N/A 01/25/2013   Procedure: ESOPHAGOGASTRODUODENOSCOPY (EGD);  Surgeon: Inda Castle, MD;  Location: [facility];  Service: Endoscopy;  Laterality: N/A;  . ESOPHAGOGASTRODUODENOSCOPY N/A 06/21/2013   Procedure: ESOPHAGOGASTRODUODENOSCOPY (EGD);  Surgeon: Inda Castle, MD;  Location: [facility];  Service: Endoscopy;  Laterality: N/A;  . ESOPHAGOGASTRODUODENOSCOPY (EGD) WITH PROPOFOL N/A 10/08/2013   Procedure: ESOPHAGOGASTRODUODENOSCOPY (EGD) WITH PROPOFOL;  Surgeon: Inda Castle, MD;  [facility];  Service: Endoscopy;  Laterality: N/A;  . ESOPHAGOGASTRODUODENOSCOPY (EGD) WITH PROPOFOL N/A 06/16/2015   Procedure: ESOPHAGOGASTRODUODENOSCOPY (EGD) WITH PROPOFOL;  Surgeon: Inda Castle, MD;  [facility];  Service: Endoscopy;  Laterality: N/A;  . ESOPHAGOGASTRODUODENOSCOPY (EGD) WITH PROPOFOL N/A 11/11/2015   Procedure: ESOPHAGOGASTRODUODENOSCOPY (EGD) WITH PROPOFOL;  Surgeon: Mauri Pole, MD;  [facility];  Service: Endoscopy;  Laterality: N/A;  . EXAMINATION UNDER ANESTHESIA  07/24/2012   Procedure: EXAM UNDER ANESTHESIA;  Surgeon: Adin Hector, MD;  [facility];  Service: General;  Laterality: N/A;  . humeral fracture surgery Right yrs ago  . PILONIDAL CYST EXCISION  07/24/2012   Procedure: CYST EXCISION  PILONIDAL SIMPLE;  Surgeon: Adin Hector, MD;  [facility];  Service: General;  Laterality: N/A;  Exam Under Anesthesia,, Excision Pilonidal Disease,   . right shoulder replacement  2008   x 2  . UPPER GASTROINTESTINAL ENDOSCOPY    . WART FULGURATION  07/24/2012   Procedure: FULGURATION ANAL WART;  Surgeon: Adin Hector, MD;  [facility];  Service: General;  Laterality: N/A;  excision of raphe mass       History of present illness and  Hospital Course:     Kindly see H&P for history of present illness and admission details, please review complete Labs, Consult reports and Test reports for all details in brief  HPI  from the history and physical done on the day of admission 10/08/2016 HPI: Benjamin Herrera is a 50  y.o. male with multiple medical issues listed below, including achalasia with cardiospasm, asthma,  GERD HIV, hypertension, sleep apnea, on CPAP, and a history of polysubstance abuse, presenting with complaints of substernal chest pain. In review, he had been seen on 10/06/2016, with intermittent chest pain, which require evaluation at the ER. His work up at the time, was negative. He returns with worsening symptoms, lasting about 20 minutes at most, substernal, but now radiating into the Joe, with associated nausea and diaphoresis. The pain occurs at rest, and nothing makes it better or worse. At the ER he did not have any episodes, dust, he did not get to receive any nitroglycerin in here he did receive Carafate, without any significant improvement of symptoms. He denies any shortness of breath, leg swelling or pain, recent travel, fevers or vomiting. He denies any cough or hemoptysis. Endorses occasional marijuana use, his last cocaine was one week ago. In addition, he has a history of tobacco abuse, but reports that has known since 1 week ago as well. He denies any history of alcohol abuse. Other recent factors include mother with a history of MI at age 75. There is no  history of blood clots.    ED Course:  BP 156/90   Pulse 73   Temp 98.3 F (36.8 C) (Oral)   Resp 18   Ht 6\' 1"  (1.854 m)   Wt 106.1 kg (234 lb)   SpO2 97%   BMI 30.87 kg/m    sodium 139 potassium 4.1 creatinine 1.25 troponin initially 0.02, now 0.04  white count 5.8 hemoglobin 14.1 glucose 114 platelets 183  chest x-ray with no acute disease  EKG now shows some TW inversion.    Hospital Course   50 year old male with multiple medical problems including achalasia with cardiospasm, GERD, HIV, HTN, OSA, polysubstance abuse presented to ED on 10/08/16 with complaints of substernal chest pain with radiation to the jaw. He had been seen for this in the ED on 10/06/16. Seen by cardiology. went  for cardiac cath on 12/18.Total RCA with collaterals with stenting of OM1 this admission DAT    Chest pain concerning for unstable angina: Second visit to the ED in a couple of days. Troponin 2 negative. Although treadmill stress test 12/16 showed no evidence of inducible myocardial ischemia, LVEF 47%, he had reproducible chest pain radiating to his throat and ST depression. As per cardiology follow-up, p. No recurrence off chest pain since treadmill stress test on 12/16. 2-D echo: LVEF 60-65 percent. went  for cardiac cath on 12/18.Total RCA with collaterals with stenting of OM1 this admission DAT  Polysubstance abuse (tobacco, THC, cocaine): Last cocaine use was one week prior to admission. UDS positive for cocaine, barbiturates and THC. Cessation counseled by multiple physicians. Verbalizes understanding.  HIV disease: Continue ART and outpatient follow-up with ID  Essential hypertension: Controlled. Continue amlodipine.  GERD: Continue PPI and sucralfate.  OSA: Continue nightly CPAP.  Pre diabetes: A1c 5.9.   Hyperlipidemia: LDL 130.started on lipitor   Discharge Condition:  stable   Follow UP  Follow-up Information    Pixie Casino, MD Follow up on 10/21/2016.     Specialty:  Cardiology Why:  with Rosaria Ferries, PA at 9:30 AM Contact information: [address] [place] [address] [phone]             Discharge Instructions  and  Discharge Medications    Discharge Instructions    Amb Referral to Cardiac Rehabilitation  Complete by:  As directed    Diagnosis:  Coronary Stents   Diet - low sodium heart healthy    Complete by:  As directed    Discharge instructions    Complete by:  As directed    Follow with Primary MD Minerva Ends, MD in 7 days   Get CBC, CMP,checked  by Primary MD next visit.    Disposition Home   Diet: Heart Healthy ,low sodium  , with feeding assistance and aspiration precautions.  For Heart failure patients - Check your Weight same time everyday, if you gain over 2 pounds, or you develop in leg swelling, experience more shortness of breath or chest pain, call your Primary MD immediately. Follow Cardiac Low Salt Diet and 1.5 lit/day fluid restriction.   On your next visit with your primary care physician please Get Medicines reviewed and adjusted.   Please request your Prim.MD to go over all Hospital Tests and Procedure/Radiological results at the follow up, please get all Hospital records sent to your Prim MD by signing hospital release before you go home.   If you experience worsening of your admission symptoms, develop shortness of breath, life threatening emergency, suicidal or homicidal thoughts you must seek medical attention immediately by calling 911 or calling your MD immediately  if symptoms less severe.  You Must read complete instructions/literature along with all the possible adverse reactions/side effects for all the Medicines you take and that have been prescribed to you. Take any new Medicines after you have completely understood and accpet all the possible adverse reactions/side effects.   Do not drive, operating heavy machinery, perform activities at heights,  swimming or participation in water activities or provide baby sitting services if your were admitted for syncope or siezures until you have seen by Primary MD or a Neurologist and advised to do so again.  Do not drive when taking Pain medications.    Do not take more than prescribed Pain, Sleep and Anxiety Medications  Special Instructions: If you have smoked or chewed Tobacco  in the last 2 yrs please stop smoking, stop any regular Alcohol  and or any Recreational drug use.  Wear Seat belts while driving.   Please note  You were cared for by a hospitalist during your hospital stay. If you have any questions about your discharge medications or the care you received while you were in the hospital after you are discharged, you can call the unit and asked to speak with the hospitalist on call if the hospitalist that took care of you is not available. Once you are discharged, your primary care physician will handle any further medical issues. Please note that NO REFILLS for any discharge medications will be authorized once you are discharged, as it is imperative that you return to your primary care physician (or establish a relationship with a primary care physician if you do not have one) for your aftercare needs so that they can reassess your need for medications and monitor your lab values   Increase activity slowly    Complete by:  As directed      Allergies as of 10/12/2016   No Known Allergies     Medication List    STOP taking these medications   VOLTAREN 1 % Gel Generic drug:  diclofenac sodium     TAKE these medications   amLODipine 5 MG tablet Commonly known as:  NORVASC Take 1 tablet (5 mg total) by mouth daily. Start taking on:  10/13/2016  aspirin 81 MG chewable tablet Chew 1 tablet (81 mg total) by mouth daily. Start taking on:  10/13/2016   atorvastatin 40 MG tablet Commonly known as:  LIPITOR Take 1 tablet (40 mg total) by mouth daily.   clopidogrel 75 MG  tablet Commonly known as:  PLAVIX Take 1 tablet (75 mg total) by mouth daily with breakfast. Start taking on:  10/13/2016   cyclobenzaprine 5 MG tablet Commonly known as:  FLEXERIL Take 1 tablet (5 mg total) by mouth 3 (three) times daily as needed for muscle spasms.   Darunavir Ethanolate 800 MG tablet Commonly known as:  PREZISTA Take 1 tablet (800 mg total) by mouth daily with breakfast. What changed:  when to take this   elvitegravir-cobicistat-emtricitabine-tenofovir 150-150-200-10 MG Tabs tablet Commonly known as:  GENVOYA Take 1 tablet by mouth daily with breakfast. What changed:  when to take this   OPCON-A OP Apply 1-2 drops to eye 2 (two) times daily as needed (dry eyes.).   pantoprazole 20 MG tablet Commonly known as:  PROTONIX Take 1 tablet (20 mg total) by mouth daily.   sucralfate 1 g tablet Commonly known as:  CARAFATE Take 1 tablet (1 g total) by mouth 4 (four) times daily -  with meals and at bedtime.   zolpidem 5 MG tablet Commonly known as:  AMBIEN Take 1 tablet (5 mg total) by mouth at bedtime as needed and may repeat dose one time if needed for sleep.         Diet and Activity recommendation: See Discharge Instructions above   Consults obtained -  cardiology   Major procedures and Radiology Reports - PLEASE review detailed and final reports for all details, in brief -    cardiac cath on 12/18.Total RCA with collaterals with stenting of OM1 this admission DAT    Dg Chest 2 View  Result Date: 10/08/2016 CLINICAL DATA:  Central chest pain radiating to jaw for 4 days. EXAM: CHEST  2 VIEW COMPARISON:  10/06/2016 FINDINGS: Mild peribronchial thickening and interstitial prominence, similar to prior study. Heart is normal size. No confluent opacities or effusions. No acute bony abnormality. IMPRESSION: Mild bronchitic changes, likely chronic. Electronically Signed   By: Rolm Baptise M.D.   On: 10/08/2016 12:41   Dg Chest 2 View  Result Date:  10/06/2016 CLINICAL DATA:  Retrosternal chest pain radiating into the jaw all with tingling in the left arm for the past 3 days. History of asthma, former smoker, gastroesophageal reflux. EXAM: CHEST  2 VIEW COMPARISON:  PA and lateral chest x-ray of July 02, 2011 and PA chest x-ray of May 06, 2016 FINDINGS: The lungs are adequately inflated. The interstitial markings are coarse bilaterally but are stable. There is no alveolar infiltrate. There is no pleural effusion or pneumothorax. The heart and pulmonary vascularity are normal. There is calcification in the wall of the aortic arch. The observed bony thorax exhibits no acute abnormality. 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 IMPRESSION: Mild chronic bronchitic-smoking related changes. No pneumonia, CHF, nor other acute cardiopulmonary abnormality. Thoracic aortic atherosclerosis. Electronically Signed   By: David  Martinique M.D.   On: 10/06/2016 17:10   Nm Myocar Multi W/spect W/wall Motion / Ef  Result Date: 10/09/2016 CLINICAL DATA:  Epigastric and central chest pain. History of asthma, cocaine use, hypertension and gastroesophageal reflux. EXAM: MYOCARDIAL IMAGING WITH SPECT (REST AND EXERCISE) GATED LEFT VENTRICULAR WALL MOTION STUDY LEFT VENTRICULAR EJECTION FRACTION TECHNIQUE: Standard myocardial SPECT imaging was performed after resting intravenous  injection of 10 mCi Tc-34m tetrofosmin. Subsequently, exercise tolerance test was performed by the patient under the supervision of the Cardiology staff. At peak-stress, 30 mCi Tc-69m tetrofosmin was injected intravenously and standard myocardial SPECT imaging was performed. Quantitative gated imaging was also performed to evaluate left ventricular wall motion, and estimate left ventricular ejection fraction. COMPARISON:  None. FINDINGS: Perfusion: The patient achieved stage III of Bruce protocol with maximal heart rate of 153 beats per minute which is above target range for age. Imaging  demonstrates no evidence of inducible myocardial ischemia with treadmill exercise. There is evidence of inferior wall attenuation extending into the inferolateral wall on both rest and stress acquisitions. When reviewing raw planar imaging, this appears to be most likely secondary to diaphragmatic attenuation. A component of myocardial scar is not entirely excluded. Wall Motion: No significant left ventricular wall motion abnormalities. No evidence of left ventricular cavity dilatation. Left Ventricular Ejection Fraction: 47 % End diastolic volume 99991111 ml End systolic volume 61 ml IMPRESSION: 1. No evidence of inducible myocardial ischemia with treadmill exercise. Inferior and inferolateral attenuation is felt to most likely be secondary to diaphragmatic attenuation. 2. Normal left ventricular wall motion. 3. Left ventricular ejection fraction 47% 4. Non invasive risk stratification*: Low *2012 Appropriate Use Criteria for Coronary Revascularization Focused Update: J Am Coll Cardiol. [id number]. [email or web address]://[email or web address].[email or web address].aspx?articleid=[email or web address] Electronically Signed   By: Aletta Edouard M.D.   On: 10/09/2016 12:36    Micro Results     No results found for this or any previous visit (from the past 240 hour(s)).     Today   Subjective:   Florestine Avers today has no headache,no chest abdominal pain,no new weakness tingling or numbness, feels much better wants to go home today.   Objective:   Blood pressure 112/65, pulse 75, temperature 98.1 F (36.7 C), temperature source Oral, resp. rate 19, height 6\' 1"  (1.854 m), weight 98.7 kg (217 lb 9.5 oz), SpO2 96 %.   Intake/Output Summary (Last 24 hours) at 10/12/16 1110 Last data filed at 10/12/16 0416  Gross per 24 hour  Intake           1657.5 ml  Output             1600 ml  Net             57.5 ml    Exam Awake Alert, Oriented x 3, No new F.N deficits, Normal affect [place].AT,PERRAL Supple Neck,No JVD, No cervical  lymphadenopathy appriciated.  Symmetrical Chest wall movement, Good air movement bilaterally, CTAB RRR,No Gallops,Rubs or new Murmurs, No Parasternal Heave +ve B.Sounds, Abd Soft, Non tender, No organomegaly appriciated, No rebound -guarding or rigidity. No Cyanosis, Clubbing or edema, No new Rash or bruise  Data Review   CBC w Diff:  Lab Results  Component Value Date   WBC 5.3 10/12/2016   HGB 14.2 10/12/2016   HCT 42.2 10/12/2016   PLT 171 10/12/2016   LYMPHOPCT 42 07/14/2016   MONOPCT 6 07/14/2016   EOSPCT 4 07/14/2016   BASOPCT 1 07/14/2016    CMP:  Lab Results  Component Value Date   NA 135 10/12/2016   K 4.1 10/12/2016   CL 101 10/12/2016   CO2 28 10/12/2016   BUN 13 10/12/2016   CREATININE 1.24 10/12/2016   CREATININE 0.96 07/14/2016   PROT 6.6 10/09/2016   ALBUMIN 3.5 10/09/2016   BILITOT 0.6 10/09/2016   ALKPHOS 67 10/09/2016   AST 18 10/09/2016   ALT 18 10/09/2016  .  Total Time in preparing paper work, data evaluation and todays exam - 35 minutes  Kamariyah Timberlake M.D on 10/12/2016 at 11:10 AM  [facility]   Office  [phone]

## 2016-10-12 NOTE — Care Management Note (Signed)
Case Management Note  Patient Details  Name: Benjamin Herrera MRN: [id number] Date of Birth: 20-Jun-1966  Subjective/Objective:     S/p coronary stent intervention, will be on plavix and asa, NCM will cont to follow for dc needs.               Action/Plan:   Expected Discharge Date:                  Expected Discharge Plan:  Home/Self Care  In-House Referral:     Discharge planning Services  CM Consult  Post Acute Care Choice:    Choice offered to:     DME Arranged:    DME Agency:     HH Arranged:    HH Agency:     Status of Service:  Completed, signed off  If discussed at [employer] of Stay Meetings, dates discussed:    Additional Comments:  Zenon Mayo, RN 10/12/2016, 7:29 AM

## 2016-10-12 NOTE — Telephone Encounter (Signed)
No charge. 

## 2016-10-12 NOTE — Discharge Instructions (Signed)
Call [facility] at [phone] if any bleeding, swelling or drainage at cath site.  May shower, no tub baths for 48 hours for groin sticks. No lifting over 5 pounds for 3 days.  No Driving for 3 days.    Heart healthy Diet  Take 1 NTG, under your tongue, while sitting.  If no relief of pain may repeat NTG, one tab every 5 minutes up to 3 tablets total over 15 minutes.  If no relief CALL 911.  If you have dizziness/lightheadness  while taking NTG, stop taking and call 911.

## 2016-10-14 ENCOUNTER — Encounter: Payer: Self-pay | Admitting: Gastroenterology

## 2016-10-14 ENCOUNTER — Ambulatory Visit (INDEPENDENT_AMBULATORY_CARE_PROVIDER_SITE_OTHER): Payer: Medicaid Other | Admitting: Gastroenterology

## 2016-10-14 VITALS — BP 124/70 | HR 96 | Ht 72.0 in | Wt 233.1 lb

## 2016-10-14 DIAGNOSIS — R1319 Other dysphagia: Secondary | ICD-10-CM

## 2016-10-14 DIAGNOSIS — K219 Gastro-esophageal reflux disease without esophagitis: Secondary | ICD-10-CM

## 2016-10-14 DIAGNOSIS — K222 Esophageal obstruction: Secondary | ICD-10-CM

## 2016-10-14 DIAGNOSIS — R1310 Dysphagia, unspecified: Secondary | ICD-10-CM

## 2016-10-14 MED ORDER — PANTOPRAZOLE SODIUM 40 MG PO TBEC: 40.0000 mg | DELAYED_RELEASE_TABLET | Freq: Every day | ORAL | 3 refills | Status: DC

## 2016-10-14 NOTE — Progress Notes (Signed)
Benjamin Herrera    [id number]    05/08/1966  Primary Care Physician:FUNCHES, Lennox Laity, MD  Referring Physician: Boykin Nearing, MD [address] [place], [place] [address]  Chief complaint:  Dysphagia   HPI: 50 year old male with history of HIV on haart here for follow-up visit with complaints of recurrent dysphagia, he was last seen in office in March 2017 and last EGD with esophageal dilation in July 2017.  Patient complains of dysphagia predominantly to solids, mostly meat and bread, is able to swallow liquids and soft foods better with no difficulty. He was recently admitted with chest pain and underwent left heart catheterization for unstable angina with stent placement in OM1 and was noted to have total occlusion of RCA with collaterals . He is currently on aspirin and Plavix . No heartburn or odynophagia  Prior to the esophageal dilation in July 2017 he had solid and liquid dysphagia associated with regurgitation. Previously he was followed by Dr. Deatra Ina. He has had multiple Botox injections with transient improvement in symptoms, last Botox injection was in August 2016. He was on trazodone in the past with no significant improvement and has also tried diltiazem which was helping initially but but subsequently did not help and has stopped taking it.  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 Status post esophageal dilation in July 2017, reports significant improvement in his swallowing   Outpatient Encounter Prescriptions as of 10/14/2016  Medication Sig  . amLODipine (NORVASC) 5 MG tablet Take 1 tablet (5 mg total) by mouth daily.  Marland Kitchen aspirin 81 MG chewable tablet Chew 1 tablet (81 mg total) by mouth daily.  Marland Kitchen atorvastatin (LIPITOR) 40 MG tablet Take 1 tablet (40 mg total) by mouth daily.  . clopidogrel (PLAVIX) 75 MG tablet Take 1 tablet (75 mg total) by mouth daily with breakfast.  . cyclobenzaprine (FLEXERIL) 5 MG tablet Take 1  tablet (5 mg total) by mouth 3 (three) times daily as needed for muscle spasms.  . Darunavir Ethanolate (PREZISTA) 800 MG tablet Take 1 tablet (800 mg total) by mouth daily with breakfast. (Patient taking differently: Take 800 mg by mouth every evening. )  . elvitegravir-cobicistat-emtricitabine-tenofovir (GENVOYA) 150-150-200-10 MG TABS tablet Take 1 tablet by mouth daily with breakfast. (Patient taking differently: Take 1 tablet by mouth every evening. )  . Naphazoline-Pheniramine (OPCON-A OP) Apply 1-2 drops to eye 2 (two) times daily as needed (dry eyes.).   Marland Kitchen pantoprazole (PROTONIX) 20 MG tablet Take 1 tablet (20 mg total) by mouth daily.  . sucralfate (CARAFATE) 1 g tablet Take 1 tablet (1 g total) by mouth 4 (four) times daily -  with meals and at bedtime.  Marland Kitchen zolpidem (AMBIEN) 5 MG tablet Take 1 tablet (5 mg total) by mouth at bedtime as needed and may repeat dose one time if needed for sleep.   No facility-administered encounter medications on file as of 10/14/2016.     Allergies as of 10/14/2016  . (No Known Allergies)    Past Medical History:  Diagnosis Date  . Achalasia and cardiospasm   . Allergy   . Arthritis    back ,shoulders   . Asthma   . Boils    under arms hx of  . Candidiasis of mouth   . Candidiasis of unspecified site   . Cholelithiasis   . Cocaine abuse   . Condyloma acuminatum in male of scrotum & anal canal s/p laser ablation  07/03/2012  . Dysphagia   . Fracture, humerus   . GERD (gastroesophageal reflux disease)   . Heart attack   . Hemorrhoids   . Hepatitis 1993   A  . Herpes labialis   . HIV (human immunodeficiency virus infection) ([place]) [place]  . Hypertension Dx 2014   off all bp meds for last 9 or 10 months  . Pilonidal disease 01/10/2012  . Polysubstance abuse   . Psoriasis    hx of  . Sleep apnea    does  have CPAP  . 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 BALLOON DILATION N/A 11/11/2015   Procedure: BALLOON DILATION;  Surgeon: Mauri Pole, MD;  Location: [facility];  Service: Endoscopy;  Laterality: N/A;  . BOTOX INJECTION  10/11/2012   Procedure: BOTOX INJECTION;  Surgeon: Inda Castle, MD;  Location: [facility];  Service: Endoscopy;  Laterality: N/A;  . BOTOX INJECTION N/A 01/25/2013   Procedure: BOTOX INJECTION;  Surgeon: Inda Castle, MD;  Location: [facility];  Service: Endoscopy;  Laterality: N/A;  . BOTOX INJECTION N/A 06/21/2013   Procedure: BOTOX INJECTION;  Surgeon: Inda Castle, MD;  [facility]: [facility];  Service: Endoscopy;  Laterality: N/A;  . BOTOX INJECTION N/A 10/08/2013   Procedure: BOTOX INJECTION;  Surgeon: Inda Castle, MD;  [facility]: [facility];  Service: Endoscopy;  Laterality: N/A;  . BOTOX INJECTION N/A 06/16/2015   Procedure: BOTOX INJECTION;  Surgeon: Inda Castle, MD;  [facility]: [facility];  Service: Endoscopy;  Laterality: N/A;  . CARDIAC CATHETERIZATION N/A 10/11/2016   Procedure: Left Heart Cath and Coronary Angiography;  Surgeon: Burnell Blanks, MD;  Location: [facility];  Service: Cardiovascular;  Laterality: N/A;  . CARDIAC CATHETERIZATION N/A 10/11/2016   Procedure: Coronary Stent Intervention;  Surgeon: Burnell Blanks, MD;  Location: [facility];  Service: Cardiovascular;  Laterality: N/A;  . COLONOSCOPY  05/19/2012   Procedure: COLONOSCOPY;  Surgeon: Inda Castle, MD;  [facility];  Service: Endoscopy;  Laterality: N/A;  jill trying to contact pt to come in 0830 for 930 case, phone not accepting messages  . COLONOSCOPY    . ESOPHAGEAL MANOMETRY  05/29/2012   Procedure: ESOPHAGEAL MANOMETRY (EM);  Surgeon: Inda Castle, MD;  [facility];  Service: Endoscopy;  Laterality: N/A;  . ESOPHAGEAL MANOMETRY N/A 10/06/2015   Procedure:  ESOPHAGEAL MANOMETRY (EM);  Surgeon: Mauri Pole, MD;  [facility];  Service: Endoscopy;  Laterality: N/A;  . ESOPHAGOGASTRODUODENOSCOPY  11/11/2011   Procedure: ESOPHAGOGASTRODUODENOSCOPY (EGD);  Surgeon: Inda Castle, MD;  Location: [facility];  Service: Endoscopy;  Laterality: N/A;  botox injection  called Pt to change time of procedure per Dr Deatra Ina  . ESOPHAGOGASTRODUODENOSCOPY  03/10/2012   Procedure: ESOPHAGOGASTRODUODENOSCOPY (EGD);  Surgeon: Inda Castle, MD;  Location: [facility];  Service: Endoscopy;  Laterality: N/A;  . ESOPHAGOGASTRODUODENOSCOPY  10/11/2012   Procedure: ESOPHAGOGASTRODUODENOSCOPY (EGD);  Surgeon: Inda Castle, MD;  Location: [facility];  Service: Endoscopy;  Laterality: N/A;  . ESOPHAGOGASTRODUODENOSCOPY N/A 01/25/2013   Procedure: ESOPHAGOGASTRODUODENOSCOPY (EGD);  Surgeon: Inda Castle, MD;  Location: [facility];  Service: Endoscopy;  Laterality: N/A;  . ESOPHAGOGASTRODUODENOSCOPY N/A 06/21/2013   Procedure: ESOPHAGOGASTRODUODENOSCOPY (EGD);  Surgeon: Inda Castle, MD;  [facility];  Service: Endoscopy;  Laterality: N/A;  . ESOPHAGOGASTRODUODENOSCOPY (EGD) WITH PROPOFOL N/A 10/08/2013   Procedure: ESOPHAGOGASTRODUODENOSCOPY (EGD) WITH PROPOFOL;  Surgeon: Inda Castle, MD;  [facility];  Service: Endoscopy;  Laterality: N/A;  . ESOPHAGOGASTRODUODENOSCOPY (EGD) WITH PROPOFOL N/A 06/16/2015   Procedure: ESOPHAGOGASTRODUODENOSCOPY (EGD) WITH PROPOFOL;  Surgeon: Inda Castle, MD;  [facility];  Service: Endoscopy;  Laterality: N/A;  . ESOPHAGOGASTRODUODENOSCOPY (EGD) WITH PROPOFOL N/A 11/11/2015   Procedure: ESOPHAGOGASTRODUODENOSCOPY (EGD) WITH PROPOFOL;  Surgeon: Mauri Pole, MD;  [facility];  Service: Endoscopy;  Laterality: N/A;  . EXAMINATION UNDER ANESTHESIA  07/24/2012   Procedure: EXAM UNDER ANESTHESIA;  Surgeon: Adin Hector, MD;  Location: [facility];  Service: General;   Laterality: N/A;  . humeral fracture surgery Right yrs ago  . PILONIDAL CYST EXCISION  07/24/2012   Procedure: CYST EXCISION PILONIDAL SIMPLE;  Surgeon: Adin Hector, MD;  Location: [facility];  Service: General;  Laterality: N/A;  Exam Under Anesthesia,, Excision Pilonidal Disease,   . right shoulder replacement  2008   x 2  . UPPER GASTROINTESTINAL ENDOSCOPY    . WART FULGURATION  07/24/2012   Procedure: FULGURATION ANAL WART;  Surgeon: Adin Hector, MD;  Location: [facility];  Service: General;  Laterality: N/A;  excision of raphe mass    Family History  Problem Relation Age of Onset  . Arthritis Mother   . Hypertension Mother   . Heart disease Mother     MI age 87  . Diabetes Maternal Grandmother   . Stomach cancer Paternal Grandmother   . Colon cancer Neg Hx   . Colon polyps Neg Hx   . Esophageal cancer Neg Hx   . Rectal cancer Neg Hx     Social History   Social History  . Marital status: Single    Spouse name: N/A  . Number of children: N/A  . Years of education: N/A   Occupational History  . Not on file.   Social History Main Topics  . Smoking status: Former Smoker    Packs/day: 0.50    Years: 25.00    Types: Cigarettes    Quit date: 11/26/2015  . Smokeless tobacco: Never Used  . Alcohol use No  . Drug use:     Frequency: 3.0 times per week    Types: Marijuana, Cocaine  . Sexual activity: Not Currently    Partners: Male     Comment: pt. given condoms, used marijuana -3 days ago 05/14/16   Other Topics Concern  . Not on file   Social History Narrative  . No narrative on file      Review of systems: Review of Systems  Constitutional: Negative for fever and chills.  HENT: Negative.   Eyes: Negative for blurred vision.  Respiratory: Negative for cough, shortness of breath and wheezing.   Cardiovascular: Negative for chest pain and palpitations.  Gastrointestinal: as per HPI Genitourinary: Negative for dysuria, urgency, frequency and hematuria.    Musculoskeletal: Positive for myalgias, back pain and joint pain.  Skin: Negative for itching and rash.  Neurological: Negative for dizziness, tremors, focal weakness, seizures and loss of consciousness.  Endo/Heme/Allergies: Negative for seasonal allergies.  Psychiatric/Behavioral: Negative for depression, suicidal ideas and hallucinations.  All other systems reviewed and are negative.   Physical Exam: Vitals:   10/14/16 0944  BP: 124/70  Pulse: 96   Body mass index is 31.62 kg/m. Gen:      No acute distress HEENT:  EOMI, sclera  anicteric Neck:     No masses; no thyromegaly Lungs:    Clear to auscultation bilaterally; normal respiratory effort CV:         Regular rate and rhythm; no murmurs Abd:      + bowel sounds; soft, non-tender; no palpable masses, no distension Ext:    No edema; adequate peripheral perfusion Skin:      Warm and dry; no rash Neuro: alert and oriented x 3 Psych: normal mood and affect  Data Reviewed:  Reviewed labs, radiology imaging, old records and pertinent past GI work up   Assessment and Plan/Recommendations:  50 year old male with HIV chronic GERD and distal esophageal stricture, findings of EGD outlet obstruction on esophageal manometry here with complaints of recurrent dysphagia mostly to solids, tough meat and bread Patient is status post recent episode of unstable angina and left heart cath with PCI and coronary stents We will hold off endoscopic evaluation and esophageal dilation for at least 3-6 months Increase Protonix to 40 mg daily and add ranitidine 300 mg at bedtime as needed Advised patient to follow antireflux measures Avoid tough meat, chunky bread or raw vegetables Small pieces and chew well Return for office visit in 3 months or sooner if needed 25 minutes was spent face-to-face with the patient. Greater than 50% of the time used for counseling as well as treatment plan and follow-up. He had multiple questions which were answered  to his satisfaction  K. Denzil Magnuson , MD [phone] Mon-Fri 8a-5p [phone] after 5p, weekends, holidays  CC: Boykin Nearing, MD

## 2016-10-14 NOTE — Patient Instructions (Signed)
Follow up in 3 months Increase protonix to 40mg  daily Take Zantac 300 mg daily at bedtime as needed

## 2016-10-21 ENCOUNTER — Ambulatory Visit (INDEPENDENT_AMBULATORY_CARE_PROVIDER_SITE_OTHER): Payer: Medicaid Other | Admitting: Physician Assistant

## 2016-10-21 ENCOUNTER — Encounter: Payer: Self-pay | Admitting: Physician Assistant

## 2016-10-21 VITALS — BP 122/78 | HR 68 | Ht 72.0 in | Wt 229.0 lb

## 2016-10-21 DIAGNOSIS — I200 Unstable angina: Secondary | ICD-10-CM

## 2016-10-21 DIAGNOSIS — I10 Essential (primary) hypertension: Secondary | ICD-10-CM

## 2016-10-21 DIAGNOSIS — Z9889 Other specified postprocedural states: Secondary | ICD-10-CM

## 2016-10-21 DIAGNOSIS — Z9861 Coronary angioplasty status: Secondary | ICD-10-CM

## 2016-10-21 DIAGNOSIS — Z79899 Other long term (current) drug therapy: Secondary | ICD-10-CM

## 2016-10-21 MED ORDER — NITROGLYCERIN 0.4 MG SL SUBL: 0.4000 mg | SUBLINGUAL_TABLET | SUBLINGUAL | 3 refills | Status: DC | PRN

## 2016-10-21 MED ORDER — ROSUVASTATIN CALCIUM 20 MG PO TABS: 20.0000 mg | ORAL_TABLET | Freq: Every day | ORAL | 3 refills | Status: DC

## 2016-10-21 MED ORDER — ATORVASTATIN CALCIUM 40 MG PO TABS: 40.0000 mg | ORAL_TABLET | Freq: Every day | ORAL | 0 refills | Status: DC

## 2016-10-21 MED ORDER — CLOPIDOGREL BISULFATE 75 MG PO TABS: 75.0000 mg | ORAL_TABLET | Freq: Every day | ORAL | 0 refills | Status: DC

## 2016-10-21 MED ORDER — PANTOPRAZOLE SODIUM 40 MG PO TBEC: 40.0000 mg | DELAYED_RELEASE_TABLET | Freq: Every day | ORAL | 3 refills | Status: DC

## 2016-10-21 MED ORDER — AMLODIPINE BESYLATE 5 MG PO TABS: 5.0000 mg | ORAL_TABLET | Freq: Every day | ORAL | 11 refills | Status: DC

## 2016-10-21 MED ORDER — AMLODIPINE BESYLATE 5 MG PO TABS: 5.0000 mg | ORAL_TABLET | Freq: Every day | ORAL | 0 refills | Status: DC

## 2016-10-21 MED ORDER — CLOPIDOGREL BISULFATE 75 MG PO TABS: 75.0000 mg | ORAL_TABLET | Freq: Every day | ORAL | 3 refills | Status: DC

## 2016-10-21 NOTE — Patient Instructions (Signed)
Medication Instructions:  STOP SUCRALFATE   If you need a refill on your cardiac medications before your next appointment, please call your pharmacy.  Labwork: HAVE LAB DONE AT LEAST 3DAYS BEFORE FOLLOW UP APPOINTMENT  Testing/Procedures: You have been referred to CARDIAC REHAB  Follow-Up: Your physician recommends that you schedule a follow-up CARDIAC appointment in: [place] physician recommends that you schedule a follow-up appointment in: ASAP [place]!    Thank you for choosing [facility] at [facility], LPN

## 2016-10-21 NOTE — Progress Notes (Signed)
Cardiology Office Note   Date:  10/21/2016   ID:  Benjamin Herrera, DOB 1966-06-07, MRN [id number]  PCP:  Minerva Ends, MD  Cardiologist:  Dr Debara Pickett (saw in-hospital)  Benjamin Herrera, Suanne Marker, PA-C    History of Present Illness: Benjamin Herrera is a 50 y.o. male with a history of achalasia and cardiospasm, HTN, OSA, +HIV, GERD, Hep A, asthma, polysubstance abuse, tob use  Admit 12/15-12/19 w/ chest pain, s/p cath w/ DES OM1, RCA 100% w/ collaterals, EF nl. UDS + cocaine, THC and barbituates, ez neg  Benjamin Herrera presents for post-hospital follow up. He is here with his sister, Benjamin Herrera, and said I could speak freely in front of her.   Since d/c, he has done fairly well. He is not sleeping well, afraid he will die in his sleep. He gets tingles in the L chest at times, not exertional.   He has not smoked or done any drugs. Wants to know if he can drink wine. Is willing to attend cardiac rehab.  He is using no salt in cooking, doesn't like the food but is eating it.   He has not had exertional chest pain, DOE, LE edema, orthopnea or PND. Has not been light-headed or dizzy. No palpitations.   Past Medical History:  Diagnosis Date  . Achalasia and cardiospasm   . Allergy   . Arthritis    back ,shoulders   . Asthma   . Boils    under arms hx of  . Candidiasis of mouth   . Candidiasis of unspecified site   . Cholelithiasis   . Cocaine abuse   . Condyloma acuminatum in male of scrotum & anal canal s/p laser ablation 07/03/2012  . Dysphagia   . Fracture, humerus   . GERD (gastroesophageal reflux disease)   . Heart attack   . Hemorrhoids   . Hepatitis 1993   A  . Herpes labialis   . HIV (human immunodeficiency virus infection) ([place]) [place]  . Hypertension Dx 2014   off all bp meds for last 9 or 10 months  . Pilonidal disease 01/10/2012  . Polysubstance abuse   . Psoriasis    hx of  . Sleep apnea    does  have CPAP  . 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 BALLOON DILATION N/A 11/11/2015   Procedure: BALLOON DILATION;  Surgeon: Mauri Pole, MD;  [facility];  Service: Endoscopy;  Laterality: N/A;  . BOTOX INJECTION  10/11/2012   Procedure: BOTOX INJECTION;  Surgeon: Inda Castle, MD;  [facility];  Service: Endoscopy;  Laterality: N/A;  . BOTOX INJECTION N/A 01/25/2013   Procedure: BOTOX INJECTION;  Surgeon: Inda Castle, MD;  [facility];  Service: Endoscopy;  Laterality: N/A;  . BOTOX INJECTION N/A 06/21/2013   Procedure: BOTOX INJECTION;  Surgeon: Inda Castle, MD;  [facility];  Service: Endoscopy;  Laterality: N/A;  . BOTOX INJECTION N/A 10/08/2013   Procedure: BOTOX INJECTION;  Surgeon: Inda Castle, MD;  [facility];  Service: Endoscopy;  Laterality: N/A;  . BOTOX INJECTION N/A 06/16/2015   Procedure: BOTOX INJECTION;  Surgeon: Inda Castle, MD;  [facility];  Service: Endoscopy;  Laterality: N/A;  . CARDIAC CATHETERIZATION N/A 10/11/2016   Procedure: Left  Heart Cath and Coronary Angiography;  Surgeon: Burnell Blanks, MD;  mRCA 100% w/ L>R collaterals, OM1 90%  . CARDIAC CATHETERIZATION N/A 10/11/2016   Procedure: Coronary Stent Intervention;  Surgeon: Burnell Blanks, MD;  Hinda Lenis [id number] DES OM1  . COLONOSCOPY  05/19/2012   Procedure: COLONOSCOPY;  Surgeon: Inda Castle, MD;  [facility];  Service: Endoscopy;  Laterality: N/A;  jill trying to contact pt to come in 0830 for 930 case, phone not accepting messages  . COLONOSCOPY    . ESOPHAGEAL MANOMETRY  05/29/2012   Procedure: ESOPHAGEAL MANOMETRY (EM);  Surgeon: Inda Castle, MD;  [facility];  Service: Endoscopy;  Laterality: N/A;  . ESOPHAGEAL MANOMETRY N/A 10/06/2015   Procedure: ESOPHAGEAL  MANOMETRY (EM);  Surgeon: Mauri Pole, MD;  [facility];  Service: Endoscopy;  Laterality: N/A;  . ESOPHAGOGASTRODUODENOSCOPY  11/11/2011   Procedure: ESOPHAGOGASTRODUODENOSCOPY (EGD);  Surgeon: Inda Castle, MD;  [facility];  Service: Endoscopy;  Laterality: N/A;  botox injection  called Pt to change time of procedure per Dr Deatra Ina  . ESOPHAGOGASTRODUODENOSCOPY  03/10/2012   Procedure: ESOPHAGOGASTRODUODENOSCOPY (EGD);  Surgeon: Inda Castle, MD;  Location: [facility];  Service: Endoscopy;  Laterality: N/A;  . ESOPHAGOGASTRODUODENOSCOPY  10/11/2012   Procedure: ESOPHAGOGASTRODUODENOSCOPY (EGD);  Surgeon: Inda Castle, MD;  Location: [facility];  Service: Endoscopy;  Laterality: N/A;  . ESOPHAGOGASTRODUODENOSCOPY N/A 01/25/2013   Procedure: ESOPHAGOGASTRODUODENOSCOPY (EGD);  Surgeon: Inda Castle, MD;  Location: [facility];  Service: Endoscopy;  Laterality: N/A;  . ESOPHAGOGASTRODUODENOSCOPY N/A 06/21/2013   Procedure: ESOPHAGOGASTRODUODENOSCOPY (EGD);  Surgeon: Inda Castle, MD;  Location: [facility];  Service: Endoscopy;  Laterality: N/A;  . ESOPHAGOGASTRODUODENOSCOPY (EGD) WITH PROPOFOL N/A 10/08/2013   Procedure: ESOPHAGOGASTRODUODENOSCOPY (EGD) WITH PROPOFOL;  Surgeon: Inda Castle, MD;  [facility];  Service: Endoscopy;  Laterality: N/A;  . ESOPHAGOGASTRODUODENOSCOPY (EGD) WITH PROPOFOL N/A 06/16/2015   Procedure: ESOPHAGOGASTRODUODENOSCOPY (EGD) WITH PROPOFOL;  Surgeon: Inda Castle, MD;  [facility];  Service: Endoscopy;  Laterality: N/A;  . ESOPHAGOGASTRODUODENOSCOPY (EGD) WITH PROPOFOL N/A 11/11/2015   Procedure: ESOPHAGOGASTRODUODENOSCOPY (EGD) WITH PROPOFOL;  Surgeon: Mauri Pole, MD;  [facility];  Service: Endoscopy;  Laterality: N/A;  . EXAMINATION UNDER ANESTHESIA  07/24/2012   Procedure: EXAM UNDER ANESTHESIA;  Surgeon: Adin Hector, MD;  [facility];  Service: General;  Laterality:  N/A;  . humeral fracture surgery Right yrs ago  . PILONIDAL CYST EXCISION  07/24/2012   Procedure: CYST EXCISION PILONIDAL SIMPLE;  Surgeon: Adin Hector, MD;  [facility];  Service: General;  Laterality: N/A;  Exam Under Anesthesia,, Excision Pilonidal Disease,   . right shoulder replacement  2008   x 2  . UPPER GASTROINTESTINAL ENDOSCOPY    . WART FULGURATION  07/24/2012   Procedure: FULGURATION ANAL WART;  Surgeon: Adin Hector, MD;  [facility];  Service: General;  Laterality: N/A;  excision of raphe mass    Current Outpatient Prescriptions  Medication Sig Dispense Refill  . amLODipine (NORVASC) 5 MG tablet Take 1 tablet (5 mg total) by mouth daily. 30 tablet 0  . aspirin 81 MG chewable tablet Chew 1 tablet (81 mg total) by mouth daily. 90 tablet 0  . atorvastatin (LIPITOR) 40 MG tablet Take 1 tablet (40 mg total) by mouth daily. 30 tablet 0  . clopidogrel (PLAVIX) 75 MG tablet Take 1 tablet (75 mg total) by mouth daily with breakfast. 90 tablet 0  .  Darunavir Ethanolate (PREZISTA) 800 MG tablet Take 800 mg by mouth every evening.    . elvitegravir-cobicistat-emtricitabine-tenofovir (GENVOYA) 150-150-200-10 MG TABS tablet Take 1 tablet by mouth every evening.    . Naphazoline-Pheniramine (OPCON-A OP) Apply 1-2 drops to eye 2 (two) times daily as needed (dry eyes.).     Marland Kitchen pantoprazole (PROTONIX) 40 MG tablet Take 1 tablet (40 mg total) by mouth daily. 90 tablet 3  . sucralfate (CARAFATE) 1 g tablet Take 1 tablet (1 g total) by mouth 4 (four) times daily -  with meals and at bedtime. 60 tablet 0  . nitroGLYCERIN (NITROSTAT) 0.4 MG SL tablet Place 1 tablet (0.4 mg total) under the tongue every 5 (five) minutes as needed for chest pain. 90 tablet 3   No current facility-administered medications for this visit.     Allergies:   Patient has no known allergies.    Social History:  The patient  reports that he quit smoking about 10 months ago. His smoking use included  Cigarettes. He has a 12.50 pack-year smoking history. He has never used smokeless tobacco. He reports that he uses drugs, including Marijuana and Cocaine, about 3 times per week. He reports that he does not drink alcohol.   Family History:  The patient's family history includes Arthritis in his mother; Diabetes in his maternal grandmother; Heart disease in his mother; Hypertension in his mother; Stomach cancer in his paternal grandmother.    ROS:  Please see the history of present illness. All other systems are reviewed and negative.    PHYSICAL EXAM: VS:  BP 122/78   Pulse 68   Ht 6' (1.829 m)   Wt 229 lb (103.9 kg)   BMI 31.06 kg/m  , BMI Body mass index is 31.06 kg/m. GEN: Well nourished, well developed, male in no acute distress  HEENT: normal for age  Neck: no JVD, no carotid bruit, no masses Cardiac: RRR; no murmur, no rubs, or gallops Respiratory:  clear to auscultation bilaterally, normal work of breathing GI: soft, nontender, nondistended, + BS MS: no deformity or atrophy; no edema; distal pulses are 2+ in all 4 extremities   Skin: warm and dry, no rash Neuro:  Strength and sensation are intact Psych: euthymic mood, full affect   EKG:  EKG is ordered today. The ekg ordered today demonstrates SR, no acute changes  ECHO: 10/09/2016 - Left ventricle: The cavity size was normal. Systolic function was   normal. The estimated ejection fraction was in the range of 60%   to 65%. Wall motion was normal; there were no regional wall   motion abnormalities. Left ventricular diastolic function   parameters were normal. - Atrial septum: No defect or patent foramen ovale was identified.  CATH: 10/11/2016  Prox RCA lesion, 20 %stenosed.  Prox RCA to Mid RCA lesion, 99 %stenosed.  Mid RCA to Dist RCA lesion, 100 %stenosed.  1st Mrg-2 lesion, 60 %stenosed.  1st Mrg-1 lesion, 90 %stenosed.  Post intervention, there is a 0% residual stenosis.  A STENT RESOLUTE ONYX 3.5X15  drug eluting stent was successfully placed.  Ost LAD to Mid LAD lesion, 20 %stenosed.  1. Severe double vessel CAD 2. Unstable angina 3. Severe stenosis first obtuse marginal branch 4. Successful PTCA/DES x 1 OM1 5. Chronic total occlusion mid RCA. The distal vessel fills from left to right collaterals.  Recommendations: Will plan one year of DAPT with ASA and Plavix. Would start a statin. Likely discharge tomorrow. Follow up with Dr. Debara Pickett.  Post-Intervention Diagram        Recent Labs: 10/09/2016: ALT 18 10/12/2016: BUN 13; Creatinine, Ser 1.24; Hemoglobin 14.2; Platelets 171; Potassium 4.1; Sodium 135    Lipid Panel    Component Value Date/Time   CHOL 203 (H) 10/09/2016 0206   TRIG 179 (H) 10/09/2016 0206   HDL 37 (L) 10/09/2016 0206   CHOLHDL 5.5 10/09/2016 0206   VLDL 36 10/09/2016 0206   LDLCALC 130 (H) 10/09/2016 0206     Wt Readings from Last 3 Encounters:  10/21/16 229 lb (103.9 kg)  10/14/16 233 lb 2 oz (105.7 kg)  10/12/16 217 lb 9.5 oz (98.7 kg)     Other studies Reviewed: Additional studies/ records that were reviewed today include: Hospital records and testing.  ASSESSMENT AND PLAN:  1.  CAD, USAP s/p PCI: continue ASA, Plavix, high-dose statin. He is not on a BB, due to UDS + cocaine. Will not add one at this time. If he remains off drugs, consider adding one at the next visit.  We will renew his cardiac meds above plus amlodipine. Add SL NTG.  2. HLD: a drug-drug interaction came up with the Lipitor and the Genvoya. Will discuss with pharmacist, change if needed. Recheck profile w/ CMET in 3 months. Pharmacist reviewed and advised change to Crestor, the equivalent is 20 mg.   3. HTN: BP well-controlled today, continue current rx, renew amlodipine.   4. Lifestyle issues: pt strongly encouraged to maintain [place] lifestyle, ok to have a glass of red wine daily.    Current medicines are reviewed at length with the patient today.  The patient has concerns  regarding medicines. Concerns were addressed.  The following changes have been made:  no change  Labs/ tests ordered today include:   Orders Placed This Encounter  Procedures  . Lipid panel  . Comprehensive metabolic panel  . Basic metabolic panel  . AMB referral to cardiac rehabilitation  . EKG 12-Lead     Disposition:   FU with Dr Debara Pickett  Signed, Rosaria Ferries, PA-C  10/21/2016 10:03 AM    [place] Phone: [phone]; Fax: [phone]  This note was written with the assistance of speech recognition software. Please excuse any transcriptional errors.

## 2016-10-28 ENCOUNTER — Ambulatory Visit: Payer: Medicaid Other | Attending: Family Medicine | Admitting: Family Medicine

## 2016-10-28 ENCOUNTER — Telehealth: Payer: Self-pay | Admitting: Internal Medicine

## 2016-10-28 ENCOUNTER — Encounter: Payer: Self-pay | Admitting: Family Medicine

## 2016-10-28 VITALS — BP 129/78 | HR 86 | Temp 98.6°F | Ht 72.0 in | Wt 229.4 lb

## 2016-10-28 DIAGNOSIS — Z9889 Other specified postprocedural states: Secondary | ICD-10-CM | POA: Insufficient documentation

## 2016-10-28 DIAGNOSIS — M25532 Pain in left wrist: Secondary | ICD-10-CM

## 2016-10-28 DIAGNOSIS — I10 Essential (primary) hypertension: Secondary | ICD-10-CM

## 2016-10-28 DIAGNOSIS — B20 Human immunodeficiency virus [HIV] disease: Secondary | ICD-10-CM | POA: Diagnosis not present

## 2016-10-28 DIAGNOSIS — F1410 Cocaine abuse, uncomplicated: Secondary | ICD-10-CM | POA: Diagnosis not present

## 2016-10-28 DIAGNOSIS — J45909 Unspecified asthma, uncomplicated: Secondary | ICD-10-CM | POA: Insufficient documentation

## 2016-10-28 DIAGNOSIS — Z8249 Family history of ischemic heart disease and other diseases of the circulatory system: Secondary | ICD-10-CM | POA: Diagnosis not present

## 2016-10-28 DIAGNOSIS — I2510 Atherosclerotic heart disease of native coronary artery without angina pectoris: Secondary | ICD-10-CM | POA: Diagnosis not present

## 2016-10-28 DIAGNOSIS — Z85828 Personal history of other malignant neoplasm of skin: Secondary | ICD-10-CM | POA: Diagnosis not present

## 2016-10-28 DIAGNOSIS — Z87891 Personal history of nicotine dependence: Secondary | ICD-10-CM | POA: Insufficient documentation

## 2016-10-28 DIAGNOSIS — F5101 Primary insomnia: Secondary | ICD-10-CM

## 2016-10-28 DIAGNOSIS — Z96611 Presence of right artificial shoulder joint: Secondary | ICD-10-CM | POA: Diagnosis not present

## 2016-10-28 DIAGNOSIS — I25110 Atherosclerotic heart disease of native coronary artery with unstable angina pectoris: Secondary | ICD-10-CM | POA: Insufficient documentation

## 2016-10-28 DIAGNOSIS — Z7982 Long term (current) use of aspirin: Secondary | ICD-10-CM | POA: Insufficient documentation

## 2016-10-28 DIAGNOSIS — K219 Gastro-esophageal reflux disease without esophagitis: Secondary | ICD-10-CM | POA: Diagnosis not present

## 2016-10-28 DIAGNOSIS — I25119 Atherosclerotic heart disease of native coronary artery with unspecified angina pectoris: Secondary | ICD-10-CM | POA: Insufficient documentation

## 2016-10-28 DIAGNOSIS — M25531 Pain in right wrist: Secondary | ICD-10-CM | POA: Diagnosis not present

## 2016-10-28 DIAGNOSIS — Z800 Family history of malignant neoplasm of digestive organs: Secondary | ICD-10-CM | POA: Insufficient documentation

## 2016-10-28 LAB — CBC
HCT: 43.6 % (ref 38.5–50.0)
HEMOGLOBIN: 14.6 g/dL (ref 13.2–17.1)
MCH: 25.9 pg — AB (ref 27.0–33.0)
MCHC: 33.5 g/dL (ref 32.0–36.0)
MCV: 77.3 fL — ABNORMAL LOW (ref 80.0–100.0)
MPV: 10.1 fL (ref 7.5–12.5)
PLATELETS: 191 10*3/uL (ref 140–400)
RBC: 5.64 MIL/uL (ref 4.20–5.80)
RDW: 15.1 % — AB (ref 11.0–15.0)
WBC: 6 10*3/uL (ref 3.8–10.8)

## 2016-10-28 LAB — COMPLETE METABOLIC PANEL WITH GFR
ALBUMIN: 4.5 g/dL (ref 3.6–5.1)
ALT: 25 U/L (ref 9–46)
AST: 24 U/L (ref 10–35)
Alkaline Phosphatase: 60 U/L (ref 40–115)
BILIRUBIN TOTAL: 0.4 mg/dL (ref 0.2–1.2)
BUN: 15 mg/dL (ref 7–25)
CO2: 23 mmol/L (ref 20–31)
CREATININE: 1.15 mg/dL (ref 0.70–1.33)
Calcium: 9.5 mg/dL (ref 8.6–10.3)
Chloride: 102 mmol/L (ref 98–110)
GFR, EST NON AFRICAN AMERICAN: 74 mL/min (ref >=60)
GFR, Est African American: 85 mL/min (ref >=60)
GLUCOSE: 91 mg/dL (ref 65–99)
POTASSIUM: 4.3 mmol/L (ref 3.5–5.3)
SODIUM: 138 mmol/L (ref 135–146)
Total Protein: 7.5 g/dL (ref 6.1–8.1)

## 2016-10-28 MED ORDER — DICLOFENAC SODIUM 1 % TD GEL
4.0000 g | Freq: Four times a day (QID) | TRANSDERMAL | 3 refills | Status: DC
Start: 2016-10-28 — End: 2016-11-01

## 2016-10-28 MED ORDER — AMLODIPINE BESYLATE 5 MG PO TABS
5.0000 mg | ORAL_TABLET | Freq: Every day | ORAL | 3 refills | Status: DC
Start: 2016-10-28 — End: 2017-01-21

## 2016-10-28 MED ORDER — ASPIRIN 81 MG PO CHEW: 81.0000 mg | CHEWABLE_TABLET | Freq: Every day | ORAL | 3 refills | Status: AC

## 2016-10-28 MED ORDER — ZOLPIDEM TARTRATE 10 MG PO TABS: 10.0000 mg | ORAL_TABLET | Freq: Every evening | ORAL | 5 refills | Status: DC | PRN

## 2016-10-28 NOTE — Progress Notes (Signed)
LOGO@  Subjective:  Patient ID: Benjamin Herrera, male    DOB: Sep 04, 1966  Age: 51 y.o. MRN: [id number]  CC: Hospitalization Follow-up   HPI RAIMON DEPPEN has HIV, HTN, CAD he presents for   1. Hospitalization follow up for chest pain: he was hospitalized from 12/15-12/19/2017 for recurrent chest pain in setting of cocaine abuse. During his hospitalization he has negative troponin x 2, negative treadmill stress test. He has episode of chest pain radiating to his throat with ST depression. He had normal ECHO. He went to cath lab on 10/11/16 and was found to have:    1. Severe double vessel CAD 2. Unstable angina 3. Severe stenosis first obtuse marginal branch 4. Successful PTCA/DES x 1 OM1 5. Chronic total occlusion mid RCA. The distal vessel fills from left to right collaterals.   Recommendations: Will plan one year of DAPT with ASA and Plavix. Would start a statin. Likely discharge tomorrow. Follow up with Dr. Debara Pickett.   He has since been compliant with Crestor, aspirin and Plavix. He denies chest pain. He denies shortness of breath. He denies smoking cigarettes and substance abuse.   2. He reports trouble sleeping since last hospitalization. He took Ambien following discharge which helped him get over 1 hr of sleep. He wakes up every hour.    3. Wrist pain: b/l dorsal wrist pain for past several months. He is being followed by a hand specialist. Working to get an MRI of his wrist approved by medicaid. No icing or NSAID. Bracing L wrist.    Past Medical History:  Diagnosis Date  . Achalasia and cardiospasm   . Allergy   . Arthritis    back ,shoulders   . Asthma   . Boils    under arms hx of  . Candidiasis of mouth   . Candidiasis of unspecified site   . Cholelithiasis   . Cocaine abuse   . Condyloma acuminatum in male of scrotum & anal canal s/p laser ablation 07/03/2012  . Dysphagia   . Fracture, humerus   . GERD (gastroesophageal reflux disease)   . Heart attack   .  Hemorrhoids   . Hepatitis 1993   A  . Herpes labialis   . HIV (human immunodeficiency virus infection) ([place]) [place]  . Hypertension Dx 2014   off all bp meds for last 9 or 10 months  . Pilonidal disease 01/10/2012  . Polysubstance abuse   . Psoriasis    hx of  . Sleep apnea    does  have CPAP  . 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 BALLOON DILATION N/A 11/11/2015   Procedure: BALLOON DILATION;  Surgeon: Mauri Pole, MD;  [facility];  Service: Endoscopy;  Laterality: N/A;  . BOTOX INJECTION  10/11/2012   Procedure: BOTOX INJECTION;  Surgeon: Inda Castle, MD;  [facility];  Service: Endoscopy;  Laterality: N/A;  . BOTOX INJECTION N/A 01/25/2013   Procedure: BOTOX INJECTION;  Surgeon: Inda Castle, MD;  [facility];  Service: Endoscopy;  Laterality: N/A;  . BOTOX INJECTION N/A 06/21/2013   Procedure: BOTOX INJECTION;  Surgeon: Inda Castle, MD;  [facility];  Service: Endoscopy;  Laterality: N/A;  . BOTOX INJECTION N/A 10/08/2013   Procedure: BOTOX INJECTION;  Surgeon: Inda Castle, MD;  [facility];  Service: Endoscopy;  Laterality: N/A;  . BOTOX INJECTION N/A 06/16/2015   Procedure: BOTOX INJECTION;  Surgeon: Inda Castle, MD;  [facility];  Service: Endoscopy;  Laterality: N/A;  . CARDIAC CATHETERIZATION N/A 10/11/2016   Procedure: Left Heart Cath and Coronary Angiography;  Surgeon: Burnell Blanks, MD;  mRCA 100% w/ L>R collaterals, OM1 90%  . CARDIAC CATHETERIZATION N/A 10/11/2016   Procedure: Coronary Stent Intervention;  Surgeon: Burnell Blanks, MD;  Hinda Lenis [id number] DES OM1  . COLONOSCOPY  05/19/2012   Procedure: COLONOSCOPY;  Surgeon: Inda Castle, MD;  [facility];  Service:  Endoscopy;  Laterality: N/A;  jill trying to contact pt to come in 0830 for 930 case, phone not accepting messages  . COLONOSCOPY    . ESOPHAGEAL MANOMETRY  05/29/2012   Procedure: ESOPHAGEAL MANOMETRY (EM);  Surgeon: Inda Castle, MD;  [facility];  Service: Endoscopy;  Laterality: N/A;  . ESOPHAGEAL MANOMETRY N/A 10/06/2015   Procedure: ESOPHAGEAL MANOMETRY (EM);  Surgeon: Mauri Pole, MD;  [facility];  Service: Endoscopy;  Laterality: N/A;  . ESOPHAGOGASTRODUODENOSCOPY  11/11/2011   Procedure: ESOPHAGOGASTRODUODENOSCOPY (EGD);  Surgeon: Inda Castle, MD;  Location: [facility];  Service: Endoscopy;  Laterality: N/A;  botox injection  called Pt to change time of procedure per Dr Deatra Ina  . ESOPHAGOGASTRODUODENOSCOPY  03/10/2012   Procedure: ESOPHAGOGASTRODUODENOSCOPY (EGD);  Surgeon: Inda Castle, MD;  Location: [facility];  Service: Endoscopy;  Laterality: N/A;  . ESOPHAGOGASTRODUODENOSCOPY  10/11/2012   Procedure: ESOPHAGOGASTRODUODENOSCOPY (EGD);  Surgeon: Inda Castle, MD;  Location: [facility];  Service: Endoscopy;  Laterality: N/A;  . ESOPHAGOGASTRODUODENOSCOPY N/A 01/25/2013   Procedure: ESOPHAGOGASTRODUODENOSCOPY (EGD);  Surgeon: Inda Castle, MD;  Location[facility];  Service: Endoscopy;  Laterality: N/A;  . ESOPHAGOGASTRODUODENOSCOPY N/A 06/21/2013   Procedure: ESOPHAGOGASTRODUODENOSCOPY (EGD);  Surgeon: Inda Castle, MD;  [facility];  Service: Endoscopy;  Laterality: N/A;  . ESOPHAGOGASTRODUODENOSCOPY (EGD) WITH PROPOFOL N/A 10/08/2013   Procedure: ESOPHAGOGASTRODUODENOSCOPY (EGD) WITH PROPOFOL;  Surgeon: Inda Castle, MD;  [facility];  Service: Endoscopy;  Laterality: N/A;  . ESOPHAGOGASTRODUODENOSCOPY (EGD) WITH PROPOFOL N/A 06/16/2015   Procedure: ESOPHAGOGASTRODUODENOSCOPY (EGD) WITH PROPOFOL;  Surgeon: Inda Castle, MD;  [facility];  Service: Endoscopy;  Laterality: N/A;  .  ESOPHAGOGASTRODUODENOSCOPY (EGD) WITH PROPOFOL N/A 11/11/2015   Procedure: ESOPHAGOGASTRODUODENOSCOPY (EGD) WITH PROPOFOL;  Surgeon: Mauri Pole, MD;  [facility];  Service: Endoscopy;  Laterality: N/A;  . EXAMINATION UNDER ANESTHESIA  07/24/2012   Procedure: EXAM UNDER ANESTHESIA;  Surgeon: Adin Hector, MD;  [facility];  Service: General;  Laterality: N/A;  . humeral fracture surgery Right yrs ago  . PILONIDAL CYST EXCISION  07/24/2012   Procedure: CYST EXCISION PILONIDAL SIMPLE;  Surgeon: Adin Hector, MD;  [facility];  Service: General;  Laterality: N/A;  Exam Under Anesthesia,, Excision Pilonidal Disease,   . right shoulder replacement  2008   x 2  . UPPER GASTROINTESTINAL ENDOSCOPY    . WART FULGURATION  07/24/2012   Procedure: FULGURATION ANAL WART;  Surgeon: Adin Hector, MD;  [facility];  Service: General;  Laterality: N/A;  excision of raphe mass    Family History  Problem Relation Age of Onset  . Arthritis Mother   . Hypertension Mother   . Heart disease Mother     MI age 48  . Diabetes Maternal Grandmother   . Stomach cancer  Paternal Grandmother   . Colon cancer Neg Hx   . Colon polyps Neg Hx   . Esophageal cancer Neg Hx   . Rectal cancer Neg Hx     Social History  Substance Use Topics  . Smoking status: Former Smoker    Packs/day: 0.50    Years: 25.00    Types: Cigarettes    Quit date: 11/26/2015  . Smokeless tobacco: Never Used  . Alcohol use No    ROS Review of Systems  Constitutional: Negative for chills, fatigue, fever and unexpected weight change.  Eyes: Negative for visual disturbance.  Respiratory: Negative for cough and shortness of breath.   Cardiovascular: Negative for chest pain, palpitations and leg swelling.  Gastrointestinal: Negative for abdominal pain, blood in stool, constipation, diarrhea, nausea and vomiting.  Endocrine: Negative for polydipsia, polyphagia and polyuria.  Musculoskeletal: Positive for  arthralgias (wrist ). Negative for back pain, gait problem, myalgias and neck pain.  Skin: Negative for rash.  Allergic/Immunologic: Negative for immunocompromised state.  Hematological: Negative for adenopathy. Does not bruise/bleed easily.  Psychiatric/Behavioral: Positive for sleep disturbance. Negative for dysphoric mood and suicidal ideas. The patient is nervous/anxious.     Objective:   Today's Vitals: BP 129/78 (BP Location: Left Arm, Patient Position: Sitting, Cuff Size: Small)   Pulse 86   Temp 98.6 F (37 C) (Oral)   Ht 6' (1.829 m)   Wt 229 lb 6.4 oz (104.1 kg)   SpO2 93%   BMI 31.11 kg/m   Physical Exam  Constitutional: He appears well-developed and well-nourished. No distress.  HENT:  Head: Normocephalic and atraumatic.  Neck: Normal range of motion. Neck supple.  Cardiovascular: Normal rate, regular rhythm, normal heart sounds and intact distal pulses.   Pulmonary/Chest: Effort normal and breath sounds normal.  Musculoskeletal: He exhibits no edema.       Right wrist: Normal.       Left wrist: He exhibits tenderness (dorsal wrist ).  Neurological: He is alert.  Skin: Skin is warm and dry. No rash noted. No erythema.  Psychiatric: He has a normal mood and affect.    Assessment & Plan:   Problem List Items Addressed This Visit      High   HTN (hypertension) (Chronic)   Relevant Medications   aspirin 81 MG chewable tablet   amLODipine (NORVASC) 5 MG tablet   CAD (coronary artery disease) (Chronic)   Relevant Medications   aspirin 81 MG chewable tablet   amLODipine (NORVASC) 5 MG tablet   Other Relevant Orders   CBC   COMPLETE METABOLIC PANEL WITH GFR    Other Visit Diagnoses    Bilateral wrist pain    -  Primary   Relevant Medications   diclofenac sodium (VOLTAREN) 1 % GEL   Other Relevant Orders   Uric Acid   Primary insomnia       Relevant Medications   zolpidem (AMBIEN) 10 MG tablet      Outpatient Encounter Prescriptions as of 10/28/2016   Medication Sig  . amLODipine (NORVASC) 5 MG tablet Take 1 tablet (5 mg total) by mouth daily.  Marland Kitchen aspirin 81 MG chewable tablet Chew 1 tablet (81 mg total) by mouth daily.  . clopidogrel (PLAVIX) 75 MG tablet Take 1 tablet (75 mg total) by mouth daily with breakfast.  . Darunavir Ethanolate (PREZISTA) 800 MG tablet Take 800 mg by mouth every evening.  . elvitegravir-cobicistat-emtricitabine-tenofovir (GENVOYA) 150-150-200-10 MG TABS tablet Take 1 tablet by mouth every evening.  [place] Fill (  OPCON-A OP) Apply 1-2 drops to eye 2 (two) times daily as needed (dry eyes.).   Marland Kitchen nitroGLYCERIN (NITROSTAT) 0.4 MG SL tablet Place 1 tablet (0.4 mg total) under the tongue every 5 (five) minutes as needed for chest pain.  . pantoprazole (PROTONIX) 40 MG tablet Take 1 tablet (40 mg total) by mouth daily.  . rosuvastatin (CRESTOR) 20 MG tablet Take 1 tablet (20 mg total) by mouth daily.   No facility-administered encounter medications on file as of 10/28/2016.     Follow-up: Return in about 3 months (around 01/26/2017) for HTN .    Boykin Nearing MD

## 2016-10-28 NOTE — Assessment & Plan Note (Signed)
Wrist pains Check uric acid Continue f/u with ortho voltaren gel ordered

## 2016-10-28 NOTE — Assessment & Plan Note (Signed)
Insomnia Add ambien 10 mg nightly

## 2016-10-28 NOTE — Patient Instructions (Addendum)
Benjamin Herrera was seen today for hospitalization follow-up.  Diagnoses and all orders for this visit:  Bilateral wrist pain -     Uric Acid -     diclofenac sodium (VOLTAREN) 1 % GEL; Apply 4 g topically 4 (four) times daily.  Coronary artery disease involving native coronary artery of native heart without angina pectoris -     CBC -     COMPLETE METABOLIC PANEL WITH GFR -     aspirin 81 MG chewable tablet; Chew 1 tablet (81 mg total) by mouth daily.  Primary insomnia -     zolpidem (AMBIEN) 10 MG tablet; Take 1 tablet (10 mg total) by mouth at bedtime as needed for sleep.  Essential hypertension -     amLODipine (NORVASC) 5 MG tablet; Take 1 tablet (5 mg total) by mouth daily.    F/u in 3 months for HTN   Dr. Adrian Blackwater

## 2016-10-28 NOTE — Telephone Encounter (Signed)
Faxed signed orders for phase 2 cardiac rehab for medicaid patients

## 2016-10-28 NOTE — Progress Notes (Signed)
Pt is here to establish care. Pt states that he had a heart attack 2 weeks ago.  Pt declined flu shot.

## 2016-10-28 NOTE — Assessment & Plan Note (Signed)
CAD  Compliant with ASA, Plavix, Crestor No smoking or substance abuse  Continue current care plan

## 2016-10-29 LAB — URIC ACID: URIC ACID, SERUM: 6.7 mg/dL (ref 4.0–8.0)

## 2016-11-01 ENCOUNTER — Other Ambulatory Visit: Payer: Self-pay | Admitting: Pharmacist

## 2016-11-01 MED ORDER — VOLTAREN 1 % TD GEL: 4.0000 g | Freq: Four times a day (QID) | TRANSDERMAL | 3 refills | Status: DC

## 2016-11-04 ENCOUNTER — Telehealth: Payer: Self-pay | Admitting: Family Medicine

## 2016-11-04 ENCOUNTER — Encounter (HOSPITAL_COMMUNITY)
Admission: RE | Admit: 2016-11-04 | Discharge: 2016-11-04 | Disposition: A | Discharge status: Home / Self Care | Payer: Medicaid Other | Source: Ambulatory Visit | Attending: Internal Medicine | Admitting: Internal Medicine

## 2016-11-04 VITALS — BP 132/82 | HR 71 | Ht 73.0 in | Wt 236.1 lb

## 2016-11-04 DIAGNOSIS — Z48812 Encounter for surgical aftercare following surgery on the circulatory system: Secondary | ICD-10-CM | POA: Diagnosis present

## 2016-11-04 DIAGNOSIS — Z955 Presence of coronary angioplasty implant and graft: Secondary | ICD-10-CM | POA: Diagnosis not present

## 2016-11-04 DIAGNOSIS — I10 Essential (primary) hypertension: Secondary | ICD-10-CM

## 2016-11-04 DIAGNOSIS — F411 Generalized anxiety disorder: Secondary | ICD-10-CM

## 2016-11-04 HISTORY — DX: Atherosclerotic heart disease of native coronary artery without angina pectoris: I25.10

## 2016-11-04 NOTE — Telephone Encounter (Signed)
Pt. Called requesting an Rx for BP cuff so that he can check his BP. Pt. States he has medicaid and would like to know if his medicaid will cover it. Pt. Also states that he would like an Rx for anxiety. He states that his PCP was going to prescribe him medication was it was not done. Please f/u

## 2016-11-04 NOTE — Progress Notes (Signed)
Cardiac Rehab Medication Review by a Pharmacist  Does the patient  feel that his/her medications are working for him/her?  Yes  Has the patient been experiencing any side effects to the medications prescribed?  Nightmares   Does the patient measure his/her own blood pressure or blood glucose at home?  No  Does the patient have any problems obtaining medications due to transportation or finances? No   Understanding of regimen: fair Understanding of indications: fair Potential of compliance: good  Pharmacist comments:   Patient presents ambulating unassisted. Home medication indications, dosing, and frequency reviewed with patient. Patient verbalized concern about a recent request for an anxiety medication, which has not been called in yet. I recommended he follow-up with the provider to ensure his request was being processed.  He also expressed concern about recent nightmares which he reports have been attributed to his medications. He reports his PCP is aware of this and they have told him the nightmares should get better with time. No further questions at the conclusion of our visit.   Argie Ramming, PharmD Pharmacy Resident  Pager [phone] 11/04/16 9:25 AM

## 2016-11-04 NOTE — Progress Notes (Signed)
Cardiac Individual Treatment Plan  Patient Details  Name: Benjamin Herrera MRN: [id number] Date of Birth: 10/26/1965 Referring Provider:   Flowsheet Row CARDIAC REHAB PHASE II ORIENTATION from 11/04/2016 in [facility]  Referring Provider  Hilty, [place] MD      Initial Encounter Date:  [place] from 11/04/2016 in [facility]  Date  11/04/16  Referring Provider  Hilty, [place] MD      Visit Diagnosis: 10/11/16 Status post coronary artery stent placement  Patient's Home Medications on Admission:  Current Outpatient Prescriptions:  .  amLODipine (NORVASC) 5 MG tablet, Take 1 tablet (5 mg total) by mouth daily., Disp: 90 tablet, Rfl: 3 .  aspirin 81 MG chewable tablet, Chew 1 tablet (81 mg total) by mouth daily., Disp: 90 tablet, Rfl: 3 .  clopidogrel (PLAVIX) 75 MG tablet, Take 1 tablet (75 mg total) by mouth daily with breakfast., Disp: 90 tablet, Rfl: 3 .  Darunavir Ethanolate (PREZISTA) 800 MG tablet, Take 800 mg by mouth every evening., Disp: , Rfl:  .  elvitegravir-cobicistat-emtricitabine-tenofovir (GENVOYA) 150-150-200-10 MG TABS tablet, Take 1 tablet by mouth every evening., Disp: , Rfl:  .  Naphazoline-Pheniramine (OPCON-A OP), Apply 1-2 drops to eye 2 (two) times daily as needed (dry eyes.). , Disp: , Rfl:  .  nitroGLYCERIN (NITROSTAT) 0.4 MG SL tablet, Place 1 tablet (0.4 mg total) under the tongue every 5 (five) minutes as needed for chest pain., Disp: 90 tablet, Rfl: 3 .  pantoprazole (PROTONIX) 40 MG tablet, Take 1 tablet (40 mg total) by mouth daily. (Patient taking differently: Take 40 mg by mouth daily as needed. ), Disp: 90 tablet, Rfl: 3 .  rosuvastatin (CRESTOR) 20 MG tablet, Take 1 tablet (20 mg total) by mouth daily., Disp: 90 tablet, Rfl: 3 .  VOLTAREN 1 % GEL, Apply 4 g topically 4 (four) times daily. (Patient taking differently: Apply 4 g topically 4 (four) times daily as  needed. ), Disp: 100 g, Rfl: 3 .  zolpidem (AMBIEN) 10 MG tablet, Take 1 tablet (10 mg total) by mouth at bedtime as needed for sleep., Disp: 30 tablet, Rfl: 5  Past Medical History: Past Medical History:  Diagnosis Date  . Achalasia and cardiospasm   . Allergy   . Arthritis    back ,shoulders   . Asthma   . Boils    under arms hx of  . Candidiasis of mouth   . Candidiasis of unspecified site   . Cholelithiasis   . Cocaine abuse   . Condyloma acuminatum in male of scrotum & anal canal s/p laser ablation 07/03/2012  . Dysphagia   . Fracture, humerus   . GERD (gastroesophageal reflux disease)   . Heart attack   . Hemorrhoids   . Hepatitis 1993   A  . Herpes labialis   . HIV (human immunodeficiency virus infection) ([place]  . Hypertension Dx 2014   off all bp meds for last 9 or 10 months  . Pilonidal disease 01/10/2012  . Polysubstance abuse   . Psoriasis    hx of  . Sleep apnea    does  have CPAP  . 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 Former Smoker  . Packs/day: 0.50  . Years: 25.00  .  Types: Cigarettes  . Quit date: 11/26/2015  Smokeless Tobacco  . Never Used    Labs: Recent Review Flowsheet Data    Labs for ITP Cardiac and Pulmonary Rehab Latest Ref Rng & Units 04/22/2014 03/17/2015 01/07/2016 10/08/2016 10/09/2016   Cholestrol 0 - 200 mg/dL 165 191 180 - 203(H)   LDLCALC 0 - 99 mg/dL 106(H) 138(H) 124 - 130(H)   HDL >40 mg/dL 29(L) 29(L) 31(L) - 37(L)   Trlycerides <150 mg/dL 149 121 124 - 179(H)   Hemoglobin A1c 4.8 - 5.6 % - - - 5.9(H) -   TCO2 0 - 100 mmol/L - - - - -      Capillary Blood Glucose: No results found for: GLUCAP   Exercise Target Goals: Date: 11/04/16  Exercise Program Goal: Individual exercise prescription set with THRR, safety & activity barriers. Participant  demonstrates ability to understand and report RPE using BORG scale, to self-measure pulse accurately, and to acknowledge the importance of the exercise prescription.  Exercise Prescription Goal: Starting with aerobic activity 30 plus minutes a day, 3 days per week for initial exercise prescription. Provide home exercise prescription and guidelines that participant acknowledges understanding prior to discharge.  Activity Barriers & Risk Stratification:     Activity Barriers & Cardiac Risk Stratification - 11/04/16 0851      Activity Barriers & Cardiac Risk Stratification   Activity Barriers Back Problems;Other (comment)   Comments rod in R arm    Cardiac Risk Stratification High      6 Minute Walk:     6 Minute Walk    Row Name 11/04/16 1421         6 Minute Walk   Phase Initial     Distance 1505 feet     Walk Time 6 minutes     # of Rest Breaks 0     MPH 2.9     METS 4.4     RPE 13     VO2 Peak 15.3     Symptoms Yes (comment)     Comments low back pain     Resting HR 71 bpm     Resting BP 132/82     Max Ex. HR 103 bpm     Max Ex. BP 152/86     2 Minute Post BP 142/82        Initial Exercise Prescription:     Initial Exercise Prescription - 11/04/16 1400      Date of Initial Exercise RX and Referring Provider   Date 11/04/16   Referring Provider Hilty, [place] MD     Treadmill   MPH 2.5   Grade 2   Minutes 10   METs 3.6     Bike   Level 0.8   Minutes 10   METs 2.42     NuStep   Level 3   Minutes 10   METs 2.5     Prescription Details   Frequency (times per week) 5   Duration Progress to 45 minutes of aerobic exercise without signs/symptoms of physical distress     Intensity   THRR 40-80% of Max Heartrate 68-136   Ratings of Perceived Exertion 11-13   Perceived Dyspnea 0-4     Progression   Progression Continue to progress workloads to maintain intensity without signs/symptoms of physical distress.     Resistance Training   Training  Prescription Yes   Weight 3   Reps 10-12      Perform Capillary Blood Glucose checks as  needed.  Exercise Prescription Changes:   Exercise Comments:   Discharge Exercise Prescription (Final Exercise Prescription Changes):   Nutrition:  Target Goals: Understanding of nutrition guidelines, daily intake of sodium 1500mg , cholesterol 200mg , calories 30% from fat and 7% or less from saturated fats, daily to have 5 or more servings of fruits and vegetables.  Biometrics:     Pre Biometrics - 11/04/16 1441      Pre Biometrics   Waist Circumference 44 inches   Hip Circumference 42.5 inches   Waist to Hip Ratio 1.04 %   Triceps Skinfold 30 mm   % Body Fat 32.4 %   Grip Strength 43 kg   Flexibility 12 in   Single Leg Stand 30 seconds       Nutrition Therapy Plan and Nutrition Goals:   Nutrition Discharge: Nutrition Scores:   Nutrition Goals Re-Evaluation:   Psychosocial: Target Goals: Acknowledge presence or absence of depression, maximize coping skills, provide positive support system. Participant is able to verbalize types and ability to use techniques and skills needed for reducing stress and depression.  Initial Review & Psychosocial Screening:     Initial Psych Review & Screening - 11/04/16 [place]? Yes     Barriers   Psychosocial barriers to participate in program There are no identifiable barriers or psychosocial needs.  Brief assessment      Quality of Life Scores:     Quality of Life - 11/04/16 1442      Quality of Life Scores   Health/Function Pre (P)  18.7 %   Socioeconomic Pre (P)  17.75 %   Psych/Spiritual Pre (P)  19.71 %   Family Pre (P)  28 %   GLOBAL Pre (P)  19.65 %      PHQ-9: Recent Review Flowsheet Data    Depression screen [facility] 2/9 10/28/2016 07/28/2016 10/08/2015 04/07/2015 03/17/2015   Decreased Interest 3 0 0 0 0   Down, Depressed, Hopeless 1 0 0 0 0   PHQ - 2 Score 4 0 0 0 0    Altered sleeping 3 - - - -   Tired, decreased energy 2 - - - -   Change in appetite 0 - - - -   Feeling bad or failure about yourself  0 - - - -   Trouble concentrating 0 - - - -   Moving slowly or fidgety/restless 0 - - - -   Suicidal thoughts 0 - - - -   PHQ-9 Score 9 - - - -      Psychosocial Evaluation and Intervention:   Psychosocial Re-Evaluation:   Vocational Rehabilitation: Provide vocational rehab assistance to qualifying candidates.   Vocational Rehab Evaluation & Intervention:     Vocational Rehab - 11/04/16 1503      Initial Vocational Rehab Evaluation & Intervention   Assessment shows need for Vocational Rehabilitation No  Pt is disabled and does not plan to return to work.      Education: Education Goals: Education classes will be provided on a weekly basis, covering required topics. Participant will state understanding/return demonstration of topics presented.  Learning Barriers/Preferences:     Learning Barriers/Preferences - 11/04/16 [id number]      Learning Barriers/Preferences   Learning Barriers None   Learning Preferences Written Material;Video;Computer/Internet;Pictoral      Education Topics: Count Your Pulse:  -Group instruction provided by verbal instruction, demonstration, patient participation and written materials to support subject.  Instructors address importance of being able to find your pulse and how to count your pulse when at home without a heart monitor.  Patients get hands on experience counting their pulse with staff help and individually.   Heart Attack, Angina, and Risk Factor Modification:  -Group instruction provided by verbal instruction, video, and written materials to support subject.  Instructors address signs and symptoms of angina and heart attacks.    Also discuss risk factors for heart disease and how to make changes to improve heart health risk factors.   Functional Fitness:  -Group instruction provided by verbal  instruction, demonstration, patient participation, and written materials to support subject.  Instructors address safety measures for doing things around the house.  Discuss how to get up and down off the floor, how to pick things up properly, how to safely get out of a chair without assistance, and balance training.   Meditation and Mindfulness:  -Group instruction provided by verbal instruction, patient participation, and written materials to support subject.  Instructor addresses importance of mindfulness and meditation practice to help reduce stress and improve awareness.  Instructor also leads participants through a meditation exercise.    Stretching for Flexibility and Mobility:  -Group instruction provided by verbal instruction, patient participation, and written materials to support subject.  Instructors lead participants through series of stretches that are designed to increase flexibility thus improving mobility.  These stretches are additional exercise for major muscle groups that are typically performed during regular warm up and cool down.   Hands Only CPR Anytime:  -Group instruction provided by verbal instruction, video, patient participation and written materials to support subject.  Instructors co-teach with AHA video for hands only CPR.  Participants get hands on experience with mannequins.   Nutrition I class: Heart Healthy Eating:  -Group instruction provided by PowerPoint slides, verbal discussion, and written materials to support subject matter. The instructor gives an explanation and review of the Therapeutic Lifestyle Changes diet recommendations, which includes a discussion on lipid goals, dietary fat, sodium, fiber, plant stanol/sterol esters, sugar, and the components of a well-balanced, healthy diet.   Nutrition II class: Lifestyle Skills:  -Group instruction provided by PowerPoint slides, verbal discussion, and written materials to support subject matter. The  instructor gives an explanation and review of label reading, grocery shopping for heart health, heart healthy recipe modifications, and ways to make healthier choices when eating out.   Diabetes Question & Answer:  -Group instruction provided by PowerPoint slides, verbal discussion, and written materials to support subject matter. The instructor gives an explanation and review of diabetes co-morbidities, pre- and post-prandial blood glucose goals, pre-exercise blood glucose goals, signs, symptoms, and treatment of hypoglycemia and hyperglycemia, and foot care basics.   Diabetes Blitz:  -Group instruction provided by PowerPoint slides, verbal discussion, and written materials to support subject matter. The instructor gives an explanation and review of the physiology behind type 1 and type 2 diabetes, diabetes medications and rational behind using different medications, pre- and post-prandial blood glucose recommendations and Hemoglobin A1c goals, diabetes diet, and exercise including blood glucose guidelines for exercising safely.    Portion Distortion:  -Group instruction provided by PowerPoint slides, verbal discussion, written materials, and food models to support subject matter. The instructor gives an explanation of serving size versus portion size, changes in portions sizes over the last 20 years, and what consists of a serving from each food group.   Stress Management:  -Group instruction provided by verbal instruction, video, and written  materials to support subject matter.  Instructors review role of stress in heart disease and how to cope with stress positively.     Exercising on Your Own:  -Group instruction provided by verbal instruction, power point, and written materials to support subject.  Instructors discuss benefits of exercise, components of exercise, frequency and intensity of exercise, and end points for exercise.  Also discuss use of nitroglycerin and activating EMS.  Review  options of places to exercise outside of rehab.  Review guidelines for sex with heart disease.   Cardiac Drugs I:  -Group instruction provided by verbal instruction and written materials to support subject.  Instructor reviews cardiac drug classes: antiplatelets, anticoagulants, beta blockers, and statins.  Instructor discusses reasons, side effects, and lifestyle considerations for each drug class.   Cardiac Drugs II:  -Group instruction provided by verbal instruction and written materials to support subject.  Instructor reviews cardiac drug classes: angiotensin converting enzyme inhibitors (ACE-I), angiotensin II receptor blockers (ARBs), nitrates, and calcium channel blockers.  Instructor discusses reasons, side effects, and lifestyle considerations for each drug class.   Anatomy and Physiology of the Circulatory System:  -Group instruction provided by verbal instruction, video, and written materials to support subject.  Reviews functional anatomy of heart, how it relates to various diagnoses, and what role the heart plays in the overall system.   Knowledge Questionnaire Score:     Knowledge Questionnaire Score - 11/04/16 1420      Knowledge Questionnaire Score   Pre Score 19/24      Core Components/Risk Factors/Patient Goals at Admission:     Personal Goals and Risk Factors at Admission - 11/04/16 0852      Core Components/Risk Factors/Patient Goals on Admission    Weight Management Yes;Weight Loss   Intervention Weight Management: Develop a combined nutrition and exercise program designed to reach desired caloric intake, while maintaining appropriate intake of nutrient and fiber, sodium and fats, and appropriate energy expenditure required for the weight goal.;Weight Management: Provide education and appropriate resources to help participant work on and attain dietary goals.;Weight Management/Obesity: Establish reasonable short term and long term weight goals.;Obesity: Provide  education and appropriate resources to help participant work on and attain dietary goals.   Expected Outcomes Short Term: Continue to assess and modify interventions until short term weight is achieved;Weight Maintenance: Understanding of the daily nutrition guidelines, which includes 25-35% calories from fat, 7% or less cal from saturated fats, less than 200mg  cholesterol, less than 1.5gm of sodium, & 5 or more servings of fruits and vegetables daily;Weight Loss: Understanding of general recommendations for a balanced deficit meal plan, which promotes 1-2 lb weight loss per week and includes a negative energy balance of [phone] kcal/d;Understanding recommendations for meals to include 15-35% energy as protein, 25-35% energy from fat, 35-60% energy from carbohydrates, less than 200mg  of dietary cholesterol, 20-35 gm of total fiber daily;Understanding of distribution of calorie intake throughout the day with the consumption of 4-5 meals/snacks   Sedentary Yes   Intervention Provide advice, education, support and counseling about physical activity/exercise needs.;Develop an individualized exercise prescription for aerobic and resistive training based on initial evaluation findings, risk stratification, comorbidities and participant's personal goals.   Expected Outcomes Achievement of increased cardiorespiratory fitness and enhanced flexibility, muscular endurance and strength shown through measurements of functional capacity and personal statement of participant.   Increase Strength and Stamina Yes   Intervention Provide advice, education, support and counseling about physical activity/exercise needs.;Develop an individualized exercise prescription for aerobic and  resistive training based on initial evaluation findings, risk stratification, comorbidities and participant's personal goals.   Expected Outcomes Achievement of increased cardiorespiratory fitness and enhanced flexibility, muscular endurance and  strength shown through measurements of functional capacity and personal statement of participant.   Hypertension Yes   Intervention Monitor prescription use compliance.;Provide education on lifestyle modifcations including regular physical activity/exercise, weight management, moderate sodium restriction and increased consumption of fresh fruit, vegetables, and low fat dairy, alcohol moderation, and smoking cessation.   Expected Outcomes Short Term: Continued assessment and intervention until BP is < 140/66mm HG in hypertensive participants. < 130/3mm HG in hypertensive participants with diabetes, heart failure or chronic kidney disease.;Long Term: Maintenance of blood pressure at goal levels.   Lipids Yes   Intervention Provide education and support for participant on nutrition & aerobic/resistive exercise along with prescribed medications to achieve LDL 70mg , HDL >40mg .   Expected Outcomes Short Term: Participant states understanding of desired cholesterol values and is compliant with medications prescribed. Participant is following exercise prescription and nutrition guidelines.;Long Term: Cholesterol controlled with medications as prescribed, with individualized exercise RX and with personalized nutrition plan. Value goals: LDL < 70mg , HDL > 40 mg.   Personal Goal Other Yes   Personal Goal Learn limitations and exercise guidelines. Decrease future occurrences.   Intervention Provide exercise programming to assist with improving exercise tolerance and cariovascular fitness. Provide cardiac education classes that aid in risk factor modifications.   Expected Outcomes Pt will be able to manage heart disease and decrease future occurrences. Pt will also be able to exercise safely on his own.      Core Components/Risk Factors/Patient Goals Review:    Core Components/Risk Factors/Patient Goals at Discharge (Final Review):    ITP Comments:     ITP Comments    Row Name 11/04/16 0850            ITP Comments Dr. Fransico Him, Medical Director          Comments:  Pt in today for cardiac rehab orientation from 0800-1030. As a part of the orientation appt pt completed 5 minutes of warm stretches and 6 minute walk test.  Pt tolerated well with no complaints.  Monitor showed Sr with no ectopy. Psychosocial Assessment - reveals no immediate concerns or barriers to rehab.  Pt plans to attend 2 x week per preference . Pt is looking forward to establishing an exercise routine. Cherre Huger, BSN

## 2016-11-04 NOTE — Telephone Encounter (Signed)
Will route to PCP 

## 2016-11-05 ENCOUNTER — Encounter (HOSPITAL_COMMUNITY): Payer: Self-pay

## 2016-11-08 ENCOUNTER — Other Ambulatory Visit: Payer: Self-pay | Admitting: Infectious Diseases

## 2016-11-08 ENCOUNTER — Encounter (HOSPITAL_COMMUNITY): Payer: Medicaid Other

## 2016-11-08 DIAGNOSIS — B20 Human immunodeficiency virus [HIV] disease: Secondary | ICD-10-CM

## 2016-11-08 MED ORDER — ESCITALOPRAM OXALATE 10 MG PO TABS: 10.0000 mg | ORAL_TABLET | Freq: Every day | ORAL | 2 refills | Status: DC

## 2016-11-08 MED ORDER — BLOOD PRESSURE MONITOR DEVI: 1.0000 | 0 refills | Status: DC

## 2016-11-08 NOTE — Telephone Encounter (Signed)
Rx for BP cuff ready for pick up  BP cuff is considered durable medical equipment, I am unsure if it will be covered by medicaid. He should take Rx to drug store or local medical supply store and check.    Once daily lexapro ordered for chronic anxiety Patient also advised to seek counseling at Counseling services available at [facility] [place], [place] and [place].   He should f/u in one month.

## 2016-11-08 NOTE — Telephone Encounter (Signed)
Pt was called and informed of script being ready for pick up. Pt also understand that Lexapro was prescribedfor his anxiety.

## 2016-11-09 ENCOUNTER — Telehealth: Payer: Self-pay | Admitting: Family Medicine

## 2016-11-09 DIAGNOSIS — F5101 Primary insomnia: Secondary | ICD-10-CM

## 2016-11-09 NOTE — Telephone Encounter (Signed)
Attempted to call patient Left VM. Requested call back.  When patient call back, clinical to pass on my recommendations.  My recommendation are:  Benzo likes xanax are not recommend long term options for anxiety because of abuse potential and risk of dependence.  I will not refill xanax    patient advised to stop buspar since he had GI upset.  Continue lexapro which we can increase to 20 mg if he is tolerating this well  Also recommend counseling to help deal with anxiety triggers. Counseling services available at [place], [place] and [place].

## 2016-11-09 NOTE — Telephone Encounter (Signed)
He took medication on yesterday and began to vomit and has felt nauseated all day.  Denies SOB, swelling in lips , rash or hives, discomfort in throat. Unable to take trazadone. He request Xanax, since he was given this when hospitalized. Informed that PCP may not be able to prescribe this medication. May prescribe alternative mediation. Will call back to inform.

## 2016-11-09 NOTE — Telephone Encounter (Signed)
Patient called and stated that anxiety medication is making him sick He said he's very nauseous and have stomach pain. Patient has vomited some. Patient said that he stopped taking the medication. Please follow up. Patient would like advice

## 2016-11-10 ENCOUNTER — Encounter (HOSPITAL_COMMUNITY): Payer: Medicaid Other

## 2016-11-12 ENCOUNTER — Encounter (HOSPITAL_COMMUNITY)
Admission: RE | Admit: 2016-11-12 | Discharge: 2016-11-12 | Disposition: A | Discharge status: Home / Self Care | Payer: Medicaid Other | Source: Ambulatory Visit | Attending: Internal Medicine | Admitting: Internal Medicine

## 2016-11-12 DIAGNOSIS — Z48812 Encounter for surgical aftercare following surgery on the circulatory system: Secondary | ICD-10-CM | POA: Diagnosis not present

## 2016-11-12 DIAGNOSIS — Z955 Presence of coronary angioplasty implant and graft: Secondary | ICD-10-CM

## 2016-11-12 NOTE — Telephone Encounter (Signed)
Message relayed. Pt aware of recommendations. He states he does not take Buspar, the Lexapro is giving him GI upset and going to counseling 3 times monthly is all he can handle right now. Pls advise.

## 2016-11-12 NOTE — Progress Notes (Signed)
Daily Session Note  Patient Details  Name: Benjamin Herrera MRN: [id number] Date of Birth: [birth year] Referring Provider:   Flowsheet Row CARDIAC REHAB PHASE II ORIENTATION from 11/04/2016 in [place]  Referring Provider  Hilty, [place] MD      Encounter Date: 11/12/2016  Check In:     Session Check In - 11/12/16 1505      Check-In   Location [facility]   Staff Present Su Hilt, MS, ACSM RCEP, Exercise Physiologist;Ashley Armstrong, MS, Exercise Physiologist;Wynter Isaacs, RN, BSN   Supervising physician immediately available to respond to emergencies [facility] immediately available   Physician(s) Dr. Wyline Copas   Medication changes reported     No   Fall or balance concerns reported    No   Warm-up and Cool-down Performed as group-led instruction   Resistance Training Performed Yes   VAD Patient? No     Pain Assessment   Currently in Pain? No/denies   Multiple Pain Sites No      Capillary Blood Glucose: No results found for this or any previous visit (from the past 24 hour(s)).   Goals Met:  Exercise tolerated well  Goals Unmet:  Not Applicable  Comments: Benjamin Herrera started cardiac rehab today.  Pt tolerated light exercise without difficulty. VSS, telemetry-Sinus Rhythm, asymptomatic.  Medication list reconciled. Pt denies barriers to medicaiton compliance.  PSYCHOSOCIAL ASSESSMENT:  PHQ-0. Pt exhibits positive coping skills, hopeful outlook with supportive family. No psychosocial needs identified at this time, no psychosocial interventions necessary.    Pt enjoys watching TV.   Pt oriented to exercise equipment and routine.    Understanding verbalized. Benjamin Herrera plans to attend exercise on Wednesday's and Fridays.Barnet Pall, RN,BSN 11/12/2016 5:42 PM Barnet Pall, RN,BSN    Dr. Fransico Him is Medical Director for Cardiac Rehab at [facility].

## 2016-11-15 ENCOUNTER — Encounter: Payer: Self-pay | Admitting: Gastroenterology

## 2016-11-15 ENCOUNTER — Encounter (HOSPITAL_COMMUNITY): Payer: Medicaid Other

## 2016-11-16 MED ORDER — MIRTAZAPINE 15 MG PO TABS: 15.0000 mg | ORAL_TABLET | Freq: Every day | ORAL | 2 refills | Status: DC

## 2016-11-16 NOTE — Addendum Note (Signed)
Addended by: Boykin Nearing on: 11/16/2016 01:42 PM   Modules accepted: Orders

## 2016-11-16 NOTE — Telephone Encounter (Signed)
Ok  since patient has anxiety with sleep disturbance Intolerant of buspar GI upset with lose dose lexapro  Plan: Keep lexapro at 10 mg, GI upset gets better over time Add remeron 15 mg at bedtime this works well with the SSRIs, helps with sleep, will cause increased appetite   Asked patient call or send mychart message with update

## 2016-11-17 ENCOUNTER — Encounter (HOSPITAL_COMMUNITY)
Admission: RE | Admit: 2016-11-17 | Discharge: 2016-11-17 | Disposition: A | Discharge status: Home / Self Care | Payer: Medicaid Other | Source: Ambulatory Visit | Attending: Internal Medicine | Admitting: Internal Medicine

## 2016-11-17 DIAGNOSIS — Z48812 Encounter for surgical aftercare following surgery on the circulatory system: Secondary | ICD-10-CM | POA: Diagnosis not present

## 2016-11-17 DIAGNOSIS — Z955 Presence of coronary angioplasty implant and graft: Secondary | ICD-10-CM

## 2016-11-17 NOTE — Telephone Encounter (Signed)
Pt aware of message. States he will stop Ambien since he gets 2 hours of sleep with this. Pt encouraged to take Remeron only for sleep.

## 2016-11-19 ENCOUNTER — Telehealth (HOSPITAL_COMMUNITY): Payer: Self-pay | Admitting: Family Medicine

## 2016-11-19 ENCOUNTER — Encounter (HOSPITAL_COMMUNITY): Payer: Medicaid Other

## 2016-11-22 ENCOUNTER — Encounter (HOSPITAL_COMMUNITY): Payer: Medicaid Other

## 2016-11-24 ENCOUNTER — Encounter (HOSPITAL_COMMUNITY)
Admission: RE | Admit: 2016-11-24 | Discharge: 2016-11-24 | Disposition: A | Discharge status: Home / Self Care | Payer: Medicaid Other | Source: Ambulatory Visit | Attending: Internal Medicine | Admitting: Internal Medicine

## 2016-11-24 DIAGNOSIS — Z48812 Encounter for surgical aftercare following surgery on the circulatory system: Secondary | ICD-10-CM | POA: Diagnosis not present

## 2016-11-24 DIAGNOSIS — Z955 Presence of coronary angioplasty implant and graft: Secondary | ICD-10-CM

## 2016-11-26 ENCOUNTER — Encounter (HOSPITAL_COMMUNITY)
Admission: RE | Admit: 2016-11-26 | Discharge: 2016-11-26 | Disposition: A | Discharge status: Home / Self Care | Payer: Medicaid Other | Source: Ambulatory Visit | Attending: Internal Medicine | Admitting: Internal Medicine

## 2016-11-26 DIAGNOSIS — Z48812 Encounter for surgical aftercare following surgery on the circulatory system: Secondary | ICD-10-CM | POA: Insufficient documentation

## 2016-11-26 DIAGNOSIS — Z955 Presence of coronary angioplasty implant and graft: Secondary | ICD-10-CM

## 2016-11-26 NOTE — Progress Notes (Signed)
Cardiac Individual Treatment Plan  Patient Details  Name: Benjamin Herrera MRN: [id number] Date of Birth: 04/13/1966 Referring Provider:   Flowsheet Row CARDIAC REHAB PHASE II ORIENTATION from 11/04/2016 in [place]  Referring Provider  Hilty, [place] MD      Initial Encounter Date:  [place] from 11/04/2016 in [place]  Date  11/04/16  Referring Provider  Hilty, [place] MD      Visit Diagnosis: 10/11/16 Status post coronary artery stent placement  Patient's Home Medications on Admission:  Current Outpatient Prescriptions:  .  amLODipine (NORVASC) 5 MG tablet, Take 1 tablet (5 mg total) by mouth daily., Disp: 90 tablet, Rfl: 3 .  aspirin 81 MG chewable tablet, Chew 1 tablet (81 mg total) by mouth daily., Disp: 90 tablet, Rfl: 3 .  Blood Pressure Monitor DEVI, 1 each by Does not apply route every morning., Disp: 1 Device, Rfl: 0 .  clopidogrel (PLAVIX) 75 MG tablet, Take 1 tablet (75 mg total) by mouth daily with breakfast., Disp: 90 tablet, Rfl: 3 .  escitalopram (LEXAPRO) 10 MG tablet, Take 1 tablet (10 mg total) by mouth daily., Disp: 30 tablet, Rfl: 2 .  GENVOYA 150-150-200-10 MG TABS tablet, TAKE 1 TABLET BY MOUTH DAILY WITH BREAKFAST., Disp: 90 tablet, Rfl: 1 .  mirtazapine (REMERON) 15 MG tablet, Take 1 tablet (15 mg total) by mouth at bedtime., Disp: 30 tablet, Rfl: 2 .  Naphazoline-Pheniramine (OPCON-A OP), Apply 1-2 drops to eye 2 (two) times daily as needed (dry eyes.). , Disp: , Rfl:  .  nitroGLYCERIN (NITROSTAT) 0.4 MG SL tablet, Place 1 tablet (0.4 mg total) under the tongue every 5 (five) minutes as needed for chest pain., Disp: 90 tablet, Rfl: 3 .  pantoprazole (PROTONIX) 40 MG tablet, Take 1 tablet (40 mg total) by mouth daily. (Patient taking differently: Take 40 mg by mouth daily as needed. ), Disp: 90 tablet, Rfl: 3 .  PREZISTA 800 MG tablet, TAKE 1 TABLET (800 MG  TOTAL) BY MOUTH DAILY WITH BREAKFAST., Disp: 90 tablet, Rfl: 1 .  rosuvastatin (CRESTOR) 20 MG tablet, Take 1 tablet (20 mg total) by mouth daily., Disp: 90 tablet, Rfl: 3 .  VOLTAREN 1 % GEL, Apply 4 g topically 4 (four) times daily. (Patient taking differently: Apply 4 g topically 4 (four) times daily as needed. ), Disp: 100 g, Rfl: 3 .  zolpidem (AMBIEN) 10 MG tablet, Take 1 tablet (10 mg total) by mouth at bedtime as needed for sleep., Disp: 30 tablet, Rfl: 5  Past Medical History: Past Medical History:  Diagnosis Date  . Achalasia and cardiospasm   . Allergy   . Arthritis    back ,shoulders   . Asthma   . Boils    under arms hx of  . Candidiasis of mouth   . Candidiasis of unspecified site   . Cholelithiasis   . Cocaine abuse   . Condyloma acuminatum in male of scrotum & anal canal s/p laser ablation 07/03/2012  . Coronary artery disease   . Dysphagia   . Fracture, humerus   . GERD (gastroesophageal reflux disease)   . Heart attack   . Hemorrhoids   . Hepatitis 1993   A  . Herpes labialis   . HIV (human immunodeficiency virus infection) ([place]) [place]  . Hypertension Dx 2014   off all bp meds for last 9 or 10 months  . Pilonidal disease 01/10/2012  .  Polysubstance abuse   . Psoriasis    hx of  . Sleep apnea    does  have CPAP  . 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 Former Smoker  . Packs/day: 0.50  . Years: 25.00  . Types: Cigarettes  . Quit date: 11/26/2015  Smokeless Tobacco  . Never Used    Labs: Recent Review Flowsheet Data    Labs for ITP Cardiac and Pulmonary Rehab Latest Ref Rng & Units 04/22/2014 03/17/2015 01/07/2016 10/08/2016 10/09/2016   Cholestrol 0 - 200 mg/dL 165 191 180 - 203(H)   LDLCALC 0 - 99 mg/dL 106(H) 138(H) 124 - 130(H)   HDL >40 mg/dL 29(L) 29(L) 31(L) - 37(L)    Trlycerides <150 mg/dL 149 121 124 - 179(H)   Hemoglobin A1c 4.8 - 5.6 % - - - 5.9(H) -   TCO2 0 - 100 mmol/L - - - - -      Capillary Blood Glucose: No results found for: GLUCAP   Exercise Target Goals:    Exercise Program Goal: Individual exercise prescription set with THRR, safety & activity barriers. Participant demonstrates ability to understand and report RPE using BORG scale, to self-measure pulse accurately, and to acknowledge the importance of the exercise prescription.  Exercise Prescription Goal: Starting with aerobic activity 30 plus minutes a day, 3 days per week for initial exercise prescription. Provide home exercise prescription and guidelines that participant acknowledges understanding prior to discharge.  Activity Barriers & Risk Stratification:     Activity Barriers & Cardiac Risk Stratification - 11/04/16 0851      Activity Barriers & Cardiac Risk Stratification   Activity Barriers Back Problems;Other (comment)   Comments rod in R arm    Cardiac Risk Stratification High      6 Minute Walk:     6 Minute Walk    Row Name 11/04/16 1421         6 Minute Walk   Phase Initial     Distance 1505 feet     Walk Time 6 minutes     # of Rest Breaks 0     MPH 2.9     METS 4.4     RPE 13     VO2 Peak 15.3     Symptoms Yes (comment)     Comments low back pain     Resting HR 71 bpm     Resting BP 132/82     Max Ex. HR 103 bpm     Max Ex. BP 152/86     2 Minute Post BP 142/82        Initial Exercise Prescription:     Initial Exercise Prescription - 11/04/16 1400      Date of Initial Exercise RX and Referring Provider   Date 11/04/16   Referring Provider Hilty, [place] MD     Treadmill   MPH 2.5   Grade 2   Minutes 10   METs 3.6     Bike   Level 0.8   Minutes 10   METs 2.42     NuStep   Level 3   Minutes 10   METs 2.5     Prescription Details   Frequency (times per week) 5   Duration Progress to 45 minutes of aerobic exercise without  signs/symptoms of physical distress  Intensity   THRR 40-80% of Max Heartrate 68-136   Ratings of Perceived Exertion 11-13   Perceived Dyspnea 0-4     Progression   Progression Continue to progress workloads to maintain intensity without signs/symptoms of physical distress.     Resistance Training   Training Prescription Yes   Weight 3   Reps 10-12      Perform Capillary Blood Glucose checks as needed.  Exercise Prescription Changes:      Exercise Prescription Changes    Row Name 11/22/16 1600             Response to Exercise   Blood Pressure (Admit) 138/80       Blood Pressure (Exercise) 148/80       Blood Pressure (Exit) 126/84       Heart Rate (Admit) 87 bpm       Heart Rate (Exercise) 113 bpm       Heart Rate (Exit) 86 bpm       Rating of Perceived Exertion (Exercise) 14       Duration Progress to 45 minutes of aerobic exercise without signs/symptoms of physical distress       Intensity THRR unchanged         Progression   Progression Continue to progress workloads to maintain intensity without signs/symptoms of physical distress.       Average METs 2.3         Resistance Training   Training Prescription Yes       Weight 3lb       Reps 10-12         Treadmill   MPH -       Grade -       Minutes -       METs -         Bike   Level -       Minutes -       METs -         Recumbant Bike   Level 2.5       Minutes 10       METs 3.7         NuStep   Level 3       Minutes 10       METs 2.6         Arm/Foot Ergometer   Level 2       Minutes 10       METs 2.4          Exercise Comments:      Exercise Comments    Row Name 11/22/16 1611           Exercise Comments Pt is off to a great start and doing well with exercise          Discharge Exercise Prescription (Final Exercise Prescription Changes):     Exercise Prescription Changes - 11/22/16 1600      Response to Exercise   Blood Pressure (Admit) 138/80   Blood Pressure  (Exercise) 148/80   Blood Pressure (Exit) 126/84   Heart Rate (Admit) 87 bpm   Heart Rate (Exercise) 113 bpm   Heart Rate (Exit) 86 bpm   Rating of Perceived Exertion (Exercise) 14   Duration Progress to 45 minutes of aerobic exercise without signs/symptoms of physical distress   Intensity THRR unchanged     Progression   Progression Continue to progress workloads to maintain intensity without signs/symptoms of physical distress.   Average METs 2.3  Resistance Training   Training Prescription Yes   Weight 3lb   Reps 10-12     Treadmill   MPH --   Grade --   Minutes --   METs --     Bike   Level --   Minutes --   METs --     Recumbant Bike   Level 2.5   Minutes 10   METs 3.7     NuStep   Level 3   Minutes 10   METs 2.6     Arm/Foot Ergometer   Level 2   Minutes 10   METs 2.4      Nutrition:  Target Goals: Understanding of nutrition guidelines, daily intake of sodium 1500mg , cholesterol 200mg , calories 30% from fat and 7% or less from saturated fats, daily to have 5 or more servings of fruits and vegetables.  Biometrics:     Pre Biometrics - 11/04/16 1441      Pre Biometrics   Waist Circumference 44 inches   Hip Circumference 42.5 inches   Waist to Hip Ratio 1.04 %   Triceps Skinfold 30 mm   % Body Fat 32.4 %   Grip Strength 43 kg   Flexibility 12 in   Single Leg Stand 30 seconds       Nutrition Therapy Plan and Nutrition Goals:     Nutrition Therapy & Goals - 11/15/16 1102      Nutrition Therapy   Diet Therapeutic Lifestyle Changes     Personal Nutrition Goals   Personal Goal #1 1-2 lb wt loss/week to a wt loss goal of 6-24 lb at graduation from [place], educate and counsel regarding individualized specific dietary modifications aiming towards targeted core components such as weight, hypertension, lipid management, diabetes, heart failure and other comorbidities.   Expected  Outcomes Short Term Goal: Understand basic principles of dietary content, such as calories, fat, sodium, cholesterol and nutrients.;Long Term Goal: Adherence to prescribed nutrition plan.      Nutrition Discharge: Nutrition Scores:   Nutrition Goals Re-Evaluation:   Psychosocial: Target Goals: Acknowledge presence or absence of depression, maximize coping skills, provide positive support system. Participant is able to verbalize types and ability to use techniques and skills needed for reducing stress and depression.  Initial Review & Psychosocial Screening:     Initial Psych Review & Screening - 11/04/16 [place]? Yes     Barriers   Psychosocial barriers to participate in program There are no identifiable barriers or psychosocial needs.  Brief assessment      Quality of Life Scores:     Quality of Life - 11/04/16 1442      Quality of Life Scores   Health/Function Pre (P)  18.7 %   Socioeconomic Pre (P)  17.75 %   Psych/Spiritual Pre (P)  19.71 %   Family Pre (P)  28 %   GLOBAL Pre (P)  19.65 %      PHQ-9: Recent Review Flowsheet Data    Depression screen [facility] 2/9 11/12/2016 10/28/2016 07/28/2016 10/08/2015 04/07/2015   Decreased Interest 0 3 0 0 0   Down, Depressed, Hopeless 0 1 0 0 0   PHQ - 2 Score 0 4 0 0 0   Altered sleeping - 3 - - -   Tired, decreased energy - 2 - - -   Change in appetite - 0 - - -  Feeling bad or failure about yourself  - 0 - - -   Trouble concentrating - 0 - - -   Moving slowly or fidgety/restless - 0 - - -   Suicidal thoughts - 0 - - -   PHQ-9 Score - 9 - - -      Psychosocial Evaluation and Intervention:   Psychosocial Re-Evaluation:     Psychosocial Re-Evaluation    [place] Name 11/26/16 [phone]             Psychosocial Re-Evaluation   Interventions Encouraged to attend Cardiac Rehabilitation for the exercise;Stress management education       Continued Psychosocial Services Needed No           Vocational Rehabilitation: Provide vocational rehab assistance to qualifying candidates.   Vocational Rehab Evaluation & Intervention:     Vocational Rehab - 11/04/16 1503      Initial Vocational Rehab Evaluation & Intervention   Assessment shows need for Vocational Rehabilitation No  Pt is disabled and does not plan to return to work.      Education: Education Goals: Education classes will be provided on a weekly basis, covering required topics. Participant will state understanding/return demonstration of topics presented.  Learning Barriers/Preferences:     Learning Barriers/Preferences - 11/04/16 [id number]      Learning Barriers/Preferences   Learning Barriers None   Learning Preferences Written Material;Video;Computer/Internet;Pictoral      Education Topics: Count Your Pulse:  -Group instruction provided by verbal instruction, demonstration, patient participation and written materials to support subject.  Instructors address importance of being able to find your pulse and how to count your pulse when at home without a heart monitor.  Patients get hands on experience counting their pulse with staff help and individually.   Heart Attack, Angina, and Risk Factor Modification:  -Group instruction provided by verbal instruction, video, and written materials to support subject.  Instructors address signs and symptoms of angina and heart attacks.    Also discuss risk factors for heart disease and how to make changes to improve heart health risk factors.   Functional Fitness:  -Group instruction provided by verbal instruction, demonstration, patient participation, and written materials to support subject.  Instructors address safety measures for doing things around the house.  Discuss how to get up and down off the floor, how to pick things up properly, how to safely get out of a chair without assistance, and balance training.   Meditation and Mindfulness:  -Group instruction  provided by verbal instruction, patient participation, and written materials to support subject.  Instructor addresses importance of mindfulness and meditation practice to help reduce stress and improve awareness.  Instructor also leads participants through a meditation exercise.    Stretching for Flexibility and Mobility:  -Group instruction provided by verbal instruction, patient participation, and written materials to support subject.  Instructors lead participants through series of stretches that are designed to increase flexibility thus improving mobility.  These stretches are additional exercise for major muscle groups that are typically performed during regular warm up and cool down.   Hands Only CPR Anytime:  -Group instruction provided by verbal instruction, video, patient participation and written materials to support subject.  Instructors co-teach with AHA video for hands only CPR.  Participants get hands on experience with mannequins.   Nutrition I class: Heart Healthy Eating:  -Group instruction provided by PowerPoint slides, verbal discussion, and written materials to support subject matter. The instructor gives an explanation and review of the Therapeutic  Lifestyle Changes diet recommendations, which includes a discussion on lipid goals, dietary fat, sodium, fiber, plant stanol/sterol esters, sugar, and the components of a well-balanced, healthy diet.   Nutrition II class: Lifestyle Skills:  -Group instruction provided by PowerPoint slides, verbal discussion, and written materials to support subject matter. The instructor gives an explanation and review of label reading, grocery shopping for heart health, heart healthy recipe modifications, and ways to make healthier choices when eating out.   Diabetes Question & Answer:  -Group instruction provided by PowerPoint slides, verbal discussion, and written materials to support subject matter. The instructor gives an explanation and  review of diabetes co-morbidities, pre- and post-prandial blood glucose goals, pre-exercise blood glucose goals, signs, symptoms, and treatment of hypoglycemia and hyperglycemia, and foot care basics.   Diabetes Blitz:  -Group instruction provided by PowerPoint slides, verbal discussion, and written materials to support subject matter. The instructor gives an explanation and review of the physiology behind type 1 and type 2 diabetes, diabetes medications and rational behind using different medications, pre- and post-prandial blood glucose recommendations and Hemoglobin A1c goals, diabetes diet, and exercise including blood glucose guidelines for exercising safely.    Portion Distortion:  -Group instruction provided by PowerPoint slides, verbal discussion, written materials, and food models to support subject matter. The instructor gives an explanation of serving size versus portion size, changes in portions sizes over the last 20 years, and what consists of a serving from each food group.   Stress Management:  -Group instruction provided by verbal instruction, video, and written materials to support subject matter.  Instructors review role of stress in heart disease and how to cope with stress positively.     Exercising on Your Own:  -Group instruction provided by verbal instruction, power point, and written materials to support subject.  Instructors discuss benefits of exercise, components of exercise, frequency and intensity of exercise, and end points for exercise.  Also discuss use of nitroglycerin and activating EMS.  Review options of places to exercise outside of rehab.  Review guidelines for sex with heart disease. Flowsheet Row CARDIAC REHAB PHASE II EXERCISE from 11/24/2016 in [place]  Date  11/24/16  Instruction Review Code  2- meets goals/outcomes      Cardiac Drugs I:  -Group instruction provided by verbal instruction and written materials to  support subject.  Instructor reviews cardiac drug classes: antiplatelets, anticoagulants, beta blockers, and statins.  Instructor discusses reasons, side effects, and lifestyle considerations for each drug class.   Cardiac Drugs II:  -Group instruction provided by verbal instruction and written materials to support subject.  Instructor reviews cardiac drug classes: angiotensin converting enzyme inhibitors (ACE-I), angiotensin II receptor blockers (ARBs), nitrates, and calcium channel blockers.  Instructor discusses reasons, side effects, and lifestyle considerations for each drug class.   Anatomy and Physiology of the Circulatory System:  -Group instruction provided by verbal instruction, video, and written materials to support subject.  Reviews functional anatomy of heart, how it relates to various diagnoses, and what role the heart plays in the overall system.   Knowledge Questionnaire Score:     Knowledge Questionnaire Score - 11/04/16 1420      Knowledge Questionnaire Score   Pre Score 19/24      Core Components/Risk Factors/Patient Goals at Admission:     Personal Goals and Risk Factors at Admission - 11/04/16 0852      Core Components/Risk Factors/Patient Goals on Admission    Weight Management Yes;Weight Loss  Intervention Weight Management: Develop a combined nutrition and exercise program designed to reach desired caloric intake, while maintaining appropriate intake of nutrient and fiber, sodium and fats, and appropriate energy expenditure required for the weight goal.;Weight Management: Provide education and appropriate resources to help participant work on and attain dietary goals.;Weight Management/Obesity: Establish reasonable short term and long term weight goals.;Obesity: Provide education and appropriate resources to help participant work on and attain dietary goals.   Expected Outcomes Short Term: Continue to assess and modify interventions until short term weight is  achieved;Weight Maintenance: Understanding of the daily nutrition guidelines, which includes 25-35% calories from fat, 7% or less cal from saturated fats, less than 200mg  cholesterol, less than 1.5gm of sodium, & 5 or more servings of fruits and vegetables daily;Weight Loss: Understanding of general recommendations for a balanced deficit meal plan, which promotes 1-2 lb weight loss per week and includes a negative energy balance of [id number] kcal/d;Understanding recommendations for meals to include 15-35% energy as protein, 25-35% energy from fat, 35-60% energy from carbohydrates, less than 200mg  of dietary cholesterol, 20-35 gm of total fiber daily;Understanding of distribution of calorie intake throughout the day with the consumption of 4-5 meals/snacks   Sedentary Yes   Intervention Provide advice, education, support and counseling about physical activity/exercise needs.;Develop an individualized exercise prescription for aerobic and resistive training based on initial evaluation findings, risk stratification, comorbidities and participant's personal goals.   Expected Outcomes Achievement of increased cardiorespiratory fitness and enhanced flexibility, muscular endurance and strength shown through measurements of functional capacity and personal statement of participant.   Increase Strength and Stamina Yes   Intervention Provide advice, education, support and counseling about physical activity/exercise needs.;Develop an individualized exercise prescription for aerobic and resistive training based on initial evaluation findings, risk stratification, comorbidities and participant's personal goals.   Expected Outcomes Achievement of increased cardiorespiratory fitness and enhanced flexibility, muscular endurance and strength shown through measurements of functional capacity and personal statement of participant.   Hypertension Yes   Intervention Monitor prescription use compliance.;Provide education on  lifestyle modifcations including regular physical activity/exercise, weight management, moderate sodium restriction and increased consumption of fresh fruit, vegetables, and low fat dairy, alcohol moderation, and smoking cessation.   Expected Outcomes Short Term: Continued assessment and intervention until BP is < 140/8mm HG in hypertensive participants. < 130/48mm HG in hypertensive participants with diabetes, heart failure or chronic kidney disease.;Long Term: Maintenance of blood pressure at goal levels.   Lipids Yes   Intervention Provide education and support for participant on nutrition & aerobic/resistive exercise along with prescribed medications to achieve LDL 70mg , HDL >40mg .   Expected Outcomes Short Term: Participant states understanding of desired cholesterol values and is compliant with medications prescribed. Participant is following exercise prescription and nutrition guidelines.;Long Term: Cholesterol controlled with medications as prescribed, with individualized exercise RX and with personalized nutrition plan. Value goals: LDL < 70mg , HDL > 40 mg.   Personal Goal Other Yes   Personal Goal Learn limitations and exercise guidelines. Decrease future occurrences.   Intervention Provide exercise programming to assist with improving exercise tolerance and cariovascular fitness. Provide cardiac education classes that aid in risk factor modifications.   Expected Outcomes Pt will be able to manage heart disease and decrease future occurrences. Pt will also be able to exercise safely on his own.      Core Components/Risk Factors/Patient Goals Review:    Core Components/Risk Factors/Patient Goals at Discharge (Final Review):    ITP Comments:     ITP  Comments    Row Name 11/04/16 0850           ITP Comments Dr. Fransico Him, Medical Director          Comments: Antony Haste  is making expected progress toward personal goals after completing 4 sessions. Recommend continued exercise and  life style modification education including  stress management and relaxation techniques to decrease cardiac risk profile. Antony Haste says he has not smoked any cigarettes since his hospitalization. Antony Haste is off to a good start.Barnet Pall, RN,BSN 11/26/2016 8:50 AM

## 2016-11-29 ENCOUNTER — Encounter (HOSPITAL_COMMUNITY): Payer: Medicaid Other

## 2016-11-30 ENCOUNTER — Ambulatory Visit: Payer: Medicaid Other | Attending: Family Medicine | Admitting: Family Medicine

## 2016-11-30 ENCOUNTER — Encounter: Payer: Self-pay | Admitting: Family Medicine

## 2016-11-30 VITALS — BP 130/72 | HR 93 | Temp 98.4°F | Ht 72.0 in | Wt 243.4 lb

## 2016-11-30 DIAGNOSIS — F418 Other specified anxiety disorders: Secondary | ICD-10-CM

## 2016-11-30 DIAGNOSIS — F419 Anxiety disorder, unspecified: Secondary | ICD-10-CM | POA: Diagnosis not present

## 2016-11-30 DIAGNOSIS — M545 Low back pain, unspecified: Secondary | ICD-10-CM

## 2016-11-30 DIAGNOSIS — Z7982 Long term (current) use of aspirin: Secondary | ICD-10-CM | POA: Diagnosis not present

## 2016-11-30 DIAGNOSIS — I2510 Atherosclerotic heart disease of native coronary artery without angina pectoris: Secondary | ICD-10-CM | POA: Diagnosis not present

## 2016-11-30 DIAGNOSIS — M1990 Unspecified osteoarthritis, unspecified site: Secondary | ICD-10-CM | POA: Insufficient documentation

## 2016-11-30 DIAGNOSIS — B20 Human immunodeficiency virus [HIV] disease: Secondary | ICD-10-CM | POA: Diagnosis not present

## 2016-11-30 DIAGNOSIS — F329 Major depressive disorder, single episode, unspecified: Secondary | ICD-10-CM | POA: Insufficient documentation

## 2016-11-30 DIAGNOSIS — F5101 Primary insomnia: Secondary | ICD-10-CM

## 2016-11-30 DIAGNOSIS — I10 Essential (primary) hypertension: Secondary | ICD-10-CM | POA: Diagnosis not present

## 2016-11-30 DIAGNOSIS — G8929 Other chronic pain: Secondary | ICD-10-CM

## 2016-11-30 DIAGNOSIS — Z87891 Personal history of nicotine dependence: Secondary | ICD-10-CM | POA: Insufficient documentation

## 2016-11-30 MED ORDER — MIRTAZAPINE 7.5 MG PO TABS: 7.5000 mg | ORAL_TABLET | Freq: Every day | ORAL | 5 refills | Status: DC

## 2016-11-30 MED ORDER — ACETAMINOPHEN ER 650 MG PO TBCR: 650.0000 mg | EXTENDED_RELEASE_TABLET | Freq: Three times a day (TID) | ORAL | 3 refills | Status: DC

## 2016-11-30 NOTE — Assessment & Plan Note (Addendum)
Chronic pain Worsening Plan: X-ray Scheduled tylenol Avoid narcotics due to substance abuse history   Lumbar x-ray is negative with preserved disc spaces Given severity of symptoms Low back MRI has been ordered, you will be contacted with appointment details

## 2016-11-30 NOTE — Patient Instructions (Addendum)
Benjamin Herrera was seen today for medication management.  Diagnoses and all orders for this visit:  Chronic bilateral low back pain without sciatica -     acetaminophen (TYLENOL 8 HOUR) 650 MG CR tablet; Take 1 tablet (650 mg total) by mouth every 8 (eight) hours. -     DG Lumbar Spine 2-3 Views; Future  Primary insomnia -     mirtazapine (REMERON) 7.5 MG tablet; Take 1 tablet (7.5 mg total) by mouth at bedtime.   You will be called with back x-ray results    Dr. Adrian Blackwater

## 2016-11-30 NOTE — Assessment & Plan Note (Signed)
Stable symptoms Patient report improved sleep  Plan: Decrease remeron to 7.5 mg nightly  Continue lexapro 10 mg daily

## 2016-11-30 NOTE — Progress Notes (Signed)
Subjective:  Patient ID: Benjamin Herrera, male    DOB: [birth year]  Age: 51 y.o. MRN: [id number]  CC: Medication Management   HPI Benjamin Herrera has HIV CAD, HTN he presents for   1. Arthritis: feeling pain in back and R hip. Pain present for past 3 years.  He is able to walk short distances. He has pain with standing. No pain with sitting.   2. Anxiety and depression: improved with Remeron and lexapro. Sleeping better with Remeron. Reports that he does oversleep and has noticed increased appetite. He is less fearful about his health.   Social History  Substance Use Topics  . Smoking status: Former Smoker    Packs/day: 0.50    Years: 25.00    Types: Cigarettes    Quit date: 11/26/2015  . Smokeless tobacco: Never Used  . Alcohol use No   Outpatient Medications Prior to Visit  Medication Sig Dispense Refill  . amLODipine (NORVASC) 5 MG tablet Take 1 tablet (5 mg total) by mouth daily. 90 tablet 3  . aspirin 81 MG chewable tablet Chew 1 tablet (81 mg total) by mouth daily. 90 tablet 3  . Blood Pressure Monitor DEVI 1 each by Does not apply route every morning. 1 Device 0  . clopidogrel (PLAVIX) 75 MG tablet Take 1 tablet (75 mg total) by mouth daily with breakfast. 90 tablet 3  . escitalopram (LEXAPRO) 10 MG tablet Take 1 tablet (10 mg total) by mouth daily. 30 tablet 2  . GENVOYA 150-150-200-10 MG TABS tablet TAKE 1 TABLET BY MOUTH DAILY WITH BREAKFAST. 90 tablet 1  . mirtazapine (REMERON) 15 MG tablet Take 1 tablet (15 mg total) by mouth at bedtime. 30 tablet 2  . Naphazoline-Pheniramine (OPCON-A OP) Apply 1-2 drops to eye 2 (two) times daily as needed (dry eyes.).     Marland Kitchen nitroGLYCERIN (NITROSTAT) 0.4 MG SL tablet Place 1 tablet (0.4 mg total) under the tongue every 5 (five) minutes as needed for chest pain. 90 tablet 3  . pantoprazole (PROTONIX) 40 MG tablet Take 1 tablet (40 mg total) by mouth daily. (Patient taking differently: Take 40 mg by mouth daily as needed. ) 90 tablet 3  .  PREZISTA 800 MG tablet TAKE 1 TABLET (800 MG TOTAL) BY MOUTH DAILY WITH BREAKFAST. 90 tablet 1  . rosuvastatin (CRESTOR) 20 MG tablet Take 1 tablet (20 mg total) by mouth daily. 90 tablet 3  . VOLTAREN 1 % GEL Apply 4 g topically 4 (four) times daily. (Patient taking differently: Apply 4 g topically 4 (four) times daily as needed. ) 100 g 3  . zolpidem (AMBIEN) 10 MG tablet Take 1 tablet (10 mg total) by mouth at bedtime as needed for sleep. 30 tablet 5   No facility-administered medications prior to visit.     ROS Review of Systems  Constitutional: Negative for chills, fatigue, fever and unexpected weight change.  Eyes: Negative for visual disturbance.  Respiratory: Negative for cough and shortness of breath.   Cardiovascular: Negative for chest pain, palpitations and leg swelling.  Gastrointestinal: Negative for abdominal pain, blood in stool, constipation, diarrhea, nausea and vomiting.  Endocrine: Negative for polydipsia, polyphagia and polyuria.  Musculoskeletal: Positive for arthralgias (wrist ). Negative for back pain, gait problem, myalgias and neck pain.  Skin: Negative for rash.  Allergic/Immunologic: Negative for immunocompromised state.  Hematological: Negative for adenopathy. Does not bruise/bleed easily.  Psychiatric/Behavioral: Positive for sleep disturbance. Negative for dysphoric mood and suicidal ideas. The patient is nervous/anxious.  Objective:  BP 130/72 (BP Location: Left Arm, Patient Position: Sitting, Cuff Size: Small)   Pulse 93   Temp 98.4 F (36.9 C) (Oral)   Ht 6' (1.829 m)   Wt 243 lb 6.4 oz (110.4 kg)   SpO2 97%   BMI 33.01 kg/m   BP/Weight 11/30/2016 [id number]  Systolic BP 123456 Q000111Q Q000111Q  Diastolic BP 69 82 78  Wt. (Lbs) 243.4 236.11 229.4  BMI 33.01 31.15 31.11    Physical Exam  Constitutional: He appears well-developed and well-nourished. No distress.  HENT:  Head: Normocephalic and atraumatic.  Neck: Normal range of motion. Neck  supple.  Cardiovascular: Normal rate, regular rhythm, normal heart sounds and intact distal pulses.   Pulmonary/Chest: Effort normal and breath sounds normal.  Musculoskeletal: He exhibits no edema.  Neurological: He is alert.  Skin: Skin is warm and dry. No rash noted. No erythema.  Psychiatric: He has a normal mood and affect.   Depression screen [facility] 2/9 11/30/2016 11/12/2016 10/28/2016  Decreased Interest 2 0 3  Down, Depressed, Hopeless 1 0 1  PHQ - 2 Score 3 0 4  Altered sleeping 3 - 3  Tired, decreased energy 3 - 2  Change in appetite 3 - 0  Feeling bad or failure about yourself  0 - 0  Trouble concentrating 0 - 0  Moving slowly or fidgety/restless 3 - 0  Suicidal thoughts 0 - 0  PHQ-9 Score 15 - 9  Some recent data might be hidden   GAD 7 : Generalized Anxiety Score 11/30/2016 10/28/2016  Nervous, Anxious, on Edge 2 3  Control/stop worrying 2 3  Worry too much - different things 2 1  Trouble relaxing 1 1  Restless 1 0  Easily annoyed or irritable 1 1  Afraid - awful might happen 3 3  Total GAD 7 Score 12 12     Assessment & Plan:  Benjamin Herrera was seen today for medication management.  Diagnoses and all orders for this visit:  Chronic bilateral low back pain without sciatica -     acetaminophen (TYLENOL 8 HOUR) 650 MG CR tablet; Take 1 tablet (650 mg total) by mouth every 8 (eight) hours. -     DG Lumbar Spine 2-3 Views; Future  Primary insomnia -     mirtazapine (REMERON) 7.5 MG tablet; Take 1 tablet (7.5 mg total) by mouth at bedtime.  Anxiety and depression   There are no diagnoses linked to this encounter.  No orders of the defined types were placed in this encounter.   Follow-up: Return in about 6 weeks (around 01/11/2017) for low back pain.   Boykin Nearing MD

## 2016-12-01 ENCOUNTER — Ambulatory Visit (HOSPITAL_COMMUNITY)
Admission: RE | Admit: 2016-12-01 | Discharge: 2016-12-01 | Disposition: A | Discharge status: Home / Self Care | Payer: Medicaid Other | Source: Ambulatory Visit | Attending: Family Medicine | Admitting: Family Medicine

## 2016-12-01 ENCOUNTER — Encounter (HOSPITAL_COMMUNITY)
Admission: RE | Admit: 2016-12-01 | Discharge: 2016-12-01 | Disposition: A | Discharge status: Home / Self Care | Payer: Medicaid Other | Source: Ambulatory Visit | Attending: Internal Medicine | Admitting: Internal Medicine

## 2016-12-01 DIAGNOSIS — G8929 Other chronic pain: Secondary | ICD-10-CM | POA: Insufficient documentation

## 2016-12-01 DIAGNOSIS — M545 Low back pain, unspecified: Secondary | ICD-10-CM

## 2016-12-01 NOTE — Progress Notes (Signed)
Incomplete Session Note  Patient Details  Name: Benjamin Herrera MRN: [id number] Date of Birth: [birth year] Referring Provider:   Flowsheet Row CARDIAC REHAB PHASE II ORIENTATION from 11/04/2016 in [place]  Referring Provider  [place], [place] MD      [name] did not complete his rehab session.  Ismaila came to Cardiac rehab today and said he is not going to exercise today because his back and hip are bothering him today.Barnet Pall, RN,BSN 12/01/2016 2:54 PM

## 2016-12-01 NOTE — Progress Notes (Addendum)
QUALITY OF LIFE SCORE REVIEW  Pt completed Quality of Life survey as a participant in Cardiac Rehab. Scores 21.0 or below are considered low. Pt score very low in several areas Overall 19.65, Health and Function 18.7, socioeconomic 17.75, physiological and spiritual 19.71, family 24. Patient quality of life slightly altered by physical constraints which limits ability to perform as prior to recent cardiac illness.  Benjamin Herrera says he is feeling better since he has been participating in phase 2 cardiac rehab. Benjamin Herrera denies being depressed. Offered emotional support and reassurance.  Will continue to monitor and intervene as necessary.  Will forward Benjamin Herrera's quality of life questionnaire to Dr Adrian Blackwater office for review.Barnet Pall, RN,BSN 12/01/2016 9:19 AM

## 2016-12-02 DIAGNOSIS — M79642 Pain in left hand: Secondary | ICD-10-CM | POA: Diagnosis not present

## 2016-12-02 DIAGNOSIS — M79641 Pain in right hand: Secondary | ICD-10-CM | POA: Diagnosis not present

## 2016-12-03 ENCOUNTER — Encounter (HOSPITAL_COMMUNITY): Payer: Medicaid Other

## 2016-12-06 ENCOUNTER — Telehealth: Payer: Self-pay | Admitting: Family Medicine

## 2016-12-06 ENCOUNTER — Encounter (HOSPITAL_COMMUNITY): Payer: Medicaid Other

## 2016-12-06 NOTE — Addendum Note (Signed)
Addended by: Boykin Nearing on: 12/06/2016 09:29 AM   Modules accepted: Orders

## 2016-12-06 NOTE — Telephone Encounter (Signed)
Please schedule lumbar MRI for patient  Benjamin Herrera, please reach out to patient. He score low on quality of life questionnaire obtained at cardiac rehab except in the area of family.  He has hx of substance abuse, stopped after recent MI. He has chronic low back pain

## 2016-12-06 NOTE — Telephone Encounter (Signed)
Pt was called to inform him of his MRI appointment for 12/13/16 @ 5:00 pm at [facility] pt is to arrive at 4:45.

## 2016-12-08 ENCOUNTER — Encounter (HOSPITAL_COMMUNITY): Payer: Medicaid Other

## 2016-12-08 NOTE — Telephone Encounter (Signed)
LCSWA contacted pt via telephone. LCSWA introduced self and explained role at [facility] disclosed that she received a consult from Dr. Adrian Blackwater to address depression and anxiety.  Pt reported that he has been feeling better recently and has noticed a decrease in symptoms. No report of SI/HI/AVH. He stated that he is compliant with taking medications which has improved amount of sleep and increased appetite. Pt shared that he has a good support system and is not interested in initiating behavioral health services stating, "I attend cardiac rehab twice a week and that's enough for me right now" Pt denied any psychosocial stressors at this time or the need for any community resources.   LCSWA encouraged pt to schedule appointment if symptoms worsen or fail to improve. No additional concerns noted.

## 2016-12-09 NOTE — Telephone Encounter (Signed)
Reviewed

## 2016-12-10 ENCOUNTER — Telehealth: Payer: Self-pay | Admitting: Family Medicine

## 2016-12-10 ENCOUNTER — Encounter (HOSPITAL_COMMUNITY): Payer: Medicaid Other

## 2016-12-10 NOTE — Telephone Encounter (Signed)
Pt was called and informed of paperwork being faxed over to [employer] to get MRI approved. I informed pt that he would receive a call once [employer] responds.

## 2016-12-10 NOTE — Telephone Encounter (Signed)
Good Afternoon  [employer] former Medicaid denied the procedure MRI Lumbar Spine  wo contrast 10-12-17 @ 5pm Please, contact [employer] if you want to appeal the decision [phone] or please, contact the patient  to cancel the appointment  [facility] Pre cert already cancel the appointment because if is not approval the day before procedure before  2pm they do it Thank You .

## 2016-12-10 NOTE — Telephone Encounter (Signed)
Pt. Called stating that he had an appt. For an MRI on Monday 12/13/16 and he received a call stating that it was denied.  Pt. Has medicaid. Please f/u with pt.

## 2016-12-13 ENCOUNTER — Encounter (HOSPITAL_COMMUNITY): Payer: Medicaid Other

## 2016-12-13 ENCOUNTER — Ambulatory Visit (HOSPITAL_COMMUNITY): Admission: RE | Admit: 2016-12-13 | Payer: Medicaid Other | Source: Ambulatory Visit

## 2016-12-15 ENCOUNTER — Encounter (HOSPITAL_COMMUNITY): Payer: Medicaid Other

## 2016-12-17 ENCOUNTER — Encounter (HOSPITAL_COMMUNITY): Payer: Medicaid Other

## 2016-12-20 ENCOUNTER — Encounter (HOSPITAL_COMMUNITY): Payer: Medicaid Other

## 2016-12-22 ENCOUNTER — Encounter (HOSPITAL_COMMUNITY): Payer: Medicaid Other

## 2016-12-23 ENCOUNTER — Telehealth (HOSPITAL_COMMUNITY): Payer: Self-pay | Admitting: *Deleted

## 2016-12-23 ENCOUNTER — Encounter (HOSPITAL_COMMUNITY): Payer: Self-pay | Admitting: *Deleted

## 2016-12-23 DIAGNOSIS — Z955 Presence of coronary angioplasty implant and graft: Secondary | ICD-10-CM

## 2016-12-23 NOTE — Progress Notes (Signed)
Cardiac Individual Treatment Plan  Patient Details  Name: Benjamin Herrera MRN: [id number] Date of Birth: December 21, 1965 Referring Provider:   Flowsheet Row CARDIAC REHAB PHASE II ORIENTATION from 11/04/2016 in [place]  Referring Provider  Hilty, [place] MD      Initial Encounter Date:  [facility] from 11/04/2016 in [place]  Date  11/04/16  Referring Provider  Hilty, [place] MD      Visit Diagnosis: 10/11/16 Status post coronary artery stent placement  Patient's Home Medications on Admission:  Current Outpatient Prescriptions:  .  acetaminophen (TYLENOL 8 HOUR) 650 MG CR tablet, Take 1 tablet (650 mg total) by mouth every 8 (eight) hours., Disp: 90 tablet, Rfl: 3 .  amLODipine (NORVASC) 5 MG tablet, Take 1 tablet (5 mg total) by mouth daily., Disp: 90 tablet, Rfl: 3 .  aspirin 81 MG chewable tablet, Chew 1 tablet (81 mg total) by mouth daily., Disp: 90 tablet, Rfl: 3 .  Blood Pressure Monitor DEVI, 1 each by Does not apply route every morning., Disp: 1 Device, Rfl: 0 .  clopidogrel (PLAVIX) 75 MG tablet, Take 1 tablet (75 mg total) by mouth daily with breakfast., Disp: 90 tablet, Rfl: 3 .  escitalopram (LEXAPRO) 10 MG tablet, Take 1 tablet (10 mg total) by mouth daily., Disp: 30 tablet, Rfl: 2 .  GENVOYA 150-150-200-10 MG TABS tablet, TAKE 1 TABLET BY MOUTH DAILY WITH BREAKFAST., Disp: 90 tablet, Rfl: 1 .  mirtazapine (REMERON) 7.5 MG tablet, Take 1 tablet (7.5 mg total) by mouth at bedtime., Disp: 30 tablet, Rfl: 5 .  Naphazoline-Pheniramine (OPCON-A OP), Apply 1-2 drops to eye 2 (two) times daily as needed (dry eyes.). , Disp: , Rfl:  .  nitroGLYCERIN (NITROSTAT) 0.4 MG SL tablet, Place 1 tablet (0.4 mg total) under the tongue every 5 (five) minutes as needed for chest pain., Disp: 90 tablet, Rfl: 3 .  pantoprazole (PROTONIX) 40 MG tablet, Take 1 tablet (40 mg total) by mouth daily.  (Patient taking differently: Take 40 mg by mouth daily as needed. ), Disp: 90 tablet, Rfl: 3 .  PREZISTA 800 MG tablet, TAKE 1 TABLET (800 MG TOTAL) BY MOUTH DAILY WITH BREAKFAST., Disp: 90 tablet, Rfl: 1 .  rosuvastatin (CRESTOR) 20 MG tablet, Take 1 tablet (20 mg total) by mouth daily., Disp: 90 tablet, Rfl: 3 .  VOLTAREN 1 % GEL, Apply 4 g topically 4 (four) times daily. (Patient taking differently: Apply 4 g topically 4 (four) times daily as needed. ), Disp: 100 g, Rfl: 3  Past Medical History: Past Medical History:  Diagnosis Date  . Achalasia and cardiospasm   . Allergy   . Arthritis    back ,shoulders   . Asthma   . Boils    under arms hx of  . Candidiasis of mouth   . Candidiasis of unspecified site   . Cholelithiasis   . Cocaine abuse   . Condyloma acuminatum in male of scrotum & anal canal s/p laser ablation 07/03/2012  . Coronary artery disease   . Dysphagia   . Fracture, humerus   . GERD (gastroesophageal reflux disease)   . Heart attack   . Hemorrhoids   . Hepatitis 1993   A  . Herpes labialis   . HIV (human immunodeficiency virus infection) ([place]) [place]  . Hypertension Dx 2014   off all bp meds for last 9 or 10 months  . Pilonidal disease 01/10/2012  .  Polysubstance abuse   . Psoriasis    hx of  . Sleep apnea    does  have CPAP  . 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 Former Smoker  . Packs/day: 0.50  . Years: 25.00  . Types: Cigarettes  . Quit date: 11/26/2015  Smokeless Tobacco  . Never Used    Labs: Recent Review Flowsheet Data    Labs for ITP Cardiac and Pulmonary Rehab Latest Ref Rng & Units 04/22/2014 03/17/2015 01/07/2016 10/08/2016 10/09/2016   Cholestrol 0 - 200 mg/dL 165 191 180 - 203(H)   LDLCALC 0 - 99 mg/dL 106(H) 138(H) 124 - 130(H)   HDL >40 mg/dL 29(L) 29(L) 31(L) - 37(L)    Trlycerides <150 mg/dL 149 121 124 - 179(H)   Hemoglobin A1c 4.8 - 5.6 % - - - 5.9(H) -   TCO2 0 - 100 mmol/L - - - - -      Capillary Blood Glucose: No results found for: GLUCAP   Exercise Target Goals:    Exercise Program Goal: Individual exercise prescription set with THRR, safety & activity barriers. Participant demonstrates ability to understand and report RPE using BORG scale, to self-measure pulse accurately, and to acknowledge the importance of the exercise prescription.  Exercise Prescription Goal: Starting with aerobic activity 30 plus minutes a day, 3 days per week for initial exercise prescription. Provide home exercise prescription and guidelines that participant acknowledges understanding prior to discharge.  Activity Barriers & Risk Stratification:     Activity Barriers & Cardiac Risk Stratification - 11/04/16 0851      Activity Barriers & Cardiac Risk Stratification   Activity Barriers Back Problems;Other (comment)   Comments rod in R arm    Cardiac Risk Stratification High      6 Minute Walk:     6 Minute Walk    Row Name 11/04/16 1421         6 Minute Walk   Phase Initial     Distance 1505 feet     Walk Time 6 minutes     # of Rest Breaks 0     MPH 2.9     METS 4.4     RPE 13     VO2 Peak 15.3     Symptoms Yes (comment)     Comments low back pain     Resting HR 71 bpm     Resting BP 132/82     Max Ex. HR 103 bpm     Max Ex. BP 152/86     2 Minute Post BP 142/82        Oxygen Initial Assessment:   Oxygen Re-Evaluation:   Oxygen Discharge (Final Oxygen Re-Evaluation):   Initial Exercise Prescription:     Initial Exercise Prescription - 11/04/16 1400      Date of Initial Exercise RX and Referring Provider   Date 11/04/16   Referring Provider Hilty, [place] MD     Treadmill   MPH 2.5   Grade 2   Minutes 10   METs 3.6     Bike   Level 0.8   Minutes 10   METs 2.42     NuStep   Level 3   Minutes 10   METs 2.5      Prescription Details   Frequency (times per week) 5  Duration Progress to 45 minutes of aerobic exercise without signs/symptoms of physical distress     Intensity   THRR 40-80% of Max Heartrate 68-136   Ratings of Perceived Exertion 11-13   Perceived Dyspnea 0-4     Progression   Progression Continue to progress workloads to maintain intensity without signs/symptoms of physical distress.     Resistance Training   Training Prescription Yes   Weight 3   Reps 10-12      Perform Capillary Blood Glucose checks as needed.  Exercise Prescription Changes:      Exercise Prescription Changes    Row Name 11/22/16 1600 12/23/16 1600           Response to Exercise   Blood Pressure (Admit) 138/80 124/62      Blood Pressure (Exercise) 148/80 148/82      Blood Pressure (Exit) 126/84 124/80      Heart Rate (Admit) 87 bpm 86 bpm      Heart Rate (Exercise) 113 bpm 109 bpm      Heart Rate (Exit) 86 bpm 85 bpm      Rating of Perceived Exertion (Exercise) 14 12      Duration Progress to 45 minutes of aerobic exercise without signs/symptoms of physical distress Progress to 45 minutes of aerobic exercise without signs/symptoms of physical distress      Intensity THRR unchanged THRR unchanged        Progression   Progression Continue to progress workloads to maintain intensity without signs/symptoms of physical distress. Continue to progress workloads to maintain intensity without signs/symptoms of physical distress.      Average METs 2.3 3.33        Resistance Training   Training Prescription Yes Yes      Weight 3lb 3lb      Reps 10-12 10-15        Treadmill   MPH -  -      Grade -  -      Minutes -  -      METs -  -        Bike   Level -  -      Minutes -  -      METs -  -        Recumbant Bike   Level 2.5 2.5      Minutes 10 10      METs 3.7 4.2        NuStep   Level 3 3      Minutes 10 10      METs 2.6 2.9        Arm/Foot Ergometer   Level 2 2      Minutes 10 10       METs 2.4 2.9        Home Exercise Plan   Plans to continue exercise at  - Home (comment)      Frequency  - Add 3 additional days to program exercise sessions.         Exercise Comments:      Exercise Comments    Row Name 11/22/16 1611 12/23/16 1615         Exercise Comments Pt is off to a great start and doing well with exercise Pt was doing well with exercise but has been absent for the past month. Hope he can return soon.         Exercise Goals and Review:      Exercise Goals  [place] Name 12/23/16 1617             Exercise Goals   Increase Physical Activity Yes       Intervention Provide advice, education, support and counseling about physical activity/exercise needs.;Develop an individualized exercise prescription for aerobic and resistive training based on initial evaluation findings, risk stratification, comorbidities and participant's personal goals.       Expected Outcomes Achievement of increased cardiorespiratory fitness and enhanced flexibility, muscular endurance and strength shown through measurements of functional capacity and personal statement of participant.       Increase Strength and Stamina Yes       Intervention Provide advice, education, support and counseling about physical activity/exercise needs.;Develop an individualized exercise prescription for aerobic and resistive training based on initial evaluation findings, risk stratification, comorbidities and participant's personal goals.       Expected Outcomes Achievement of increased cardiorespiratory fitness and enhanced flexibility, muscular endurance and strength shown through measurements of functional capacity and personal statement of participant.          Exercise Goals Re-Evaluation :     Exercise Goals Re-Evaluation    Row Name 12/23/16 1614             Exercise Goal Re-Evaluation   Exercise Goals Review Increase Physical Activity;Increase Strenth and Stamina       Comments Pt  was doing well with exercise, but has been abscent for several weeks to a month due to LBP, shoulder pain and illness.  Hope he can return soon       Expected Outcomes Resume CR exercise prescription as soon as possible in order to improve strength, stamina and overall cardiorespiratory fitness.             Discharge Exercise Prescription (Final Exercise Prescription Changes):     Exercise Prescription Changes - 12/23/16 1600      Response to Exercise   Blood Pressure (Admit) 124/62   Blood Pressure (Exercise) 148/82   Blood Pressure (Exit) 124/80   Heart Rate (Admit) 86 bpm   Heart Rate (Exercise) 109 bpm   Heart Rate (Exit) 85 bpm   Rating of Perceived Exertion (Exercise) 12   Duration Progress to 45 minutes of aerobic exercise without signs/symptoms of physical distress   Intensity THRR unchanged     Progression   Progression Continue to progress workloads to maintain intensity without signs/symptoms of physical distress.   Average METs 3.33     Resistance Training   Training Prescription Yes   Weight 3lb   Reps 10-15     Recumbant Bike   Level 2.5   Minutes 10   METs 4.2     NuStep   Level 3   Minutes 10   METs 2.9     Arm/Foot Ergometer   Level 2   Minutes 10   METs 2.9     Home Exercise Plan   Plans to continue exercise at Home (comment)   Frequency Add 3 additional days to program exercise sessions.      Nutrition:  Target Goals: Understanding of nutrition guidelines, daily intake of sodium 1500mg , cholesterol 200mg , calories 30% from fat and 7% or less from saturated fats, daily to have 5 or more servings of fruits and vegetables.  Biometrics:     Pre Biometrics - 11/04/16 1441      Pre Biometrics   Waist Circumference 44 inches   Hip Circumference 42.5 inches   Waist to Hip Ratio 1.04 %   Triceps  Skinfold 30 mm   % Body Fat 32.4 %   Grip Strength 43 kg   Flexibility 12 in   Single Leg Stand 30 seconds       Nutrition Therapy Plan and  Nutrition Goals:     Nutrition Therapy & Goals - 11/15/16 1102      Nutrition Therapy   Diet Therapeutic Lifestyle Changes     Personal Nutrition Goals   Nutrition Goal 1-2 lb wt loss/week to a wt loss goal of 6-24 lb at graduation from [facility], educate and counsel regarding individualized specific dietary modifications aiming towards targeted core components such as weight, hypertension, lipid management, diabetes, heart failure and other comorbidities.   Expected Outcomes Short Term Goal: Understand basic principles of dietary content, such as calories, fat, sodium, cholesterol and nutrients.;Long Term Goal: Adherence to prescribed nutrition plan.      Nutrition Discharge: Nutrition Scores:   Nutrition Goals Re-Evaluation:   Nutrition Goals Re-Evaluation:   Nutrition Goals Discharge (Final Nutrition Goals Re-Evaluation):   Psychosocial: Target Goals: Acknowledge presence or absence of significant depression and/or stress, maximize coping skills, provide positive support system. Participant is able to verbalize types and ability to use techniques and skills needed for reducing stress and depression.  Initial Review & Psychosocial Screening:     Initial Psych Review & Screening - 11/04/16 [place]? Yes     Barriers   Psychosocial barriers to participate in program There are no identifiable barriers or psychosocial needs.  Brief assessment      Quality of Life Scores:     Quality of Life - 11/04/16 1442      Quality of Life Scores   Health/Function Pre (P)  18.7 %   Socioeconomic Pre (P)  17.75 %   Psych/Spiritual Pre (P)  19.71 %   Family Pre (P)  28 %   GLOBAL Pre (P)  19.65 %      PHQ-9: Recent Review Flowsheet Data    Depression screen [facility] 2/9 11/30/2016 11/12/2016 10/28/2016 07/28/2016 10/08/2015   Decreased Interest 2 0 3 0 0   Down, Depressed, Hopeless 1 0 1 0 0    PHQ - 2 Score 3 0 4 0 0   Altered sleeping 3 - 3 - -   Tired, decreased energy 3 - 2 - -   Change in appetite 3 - 0 - -   Feeling bad or failure about yourself  0 - 0 - -   Trouble concentrating 0 - 0 - -   Moving slowly or fidgety/restless 3 - 0 - -   Suicidal thoughts 0 - 0 - -   PHQ-9 Score 15 - 9 - -     Interpretation of Total Score  Total Score Depression Severity:  1-4 = Minimal depression, 5-9 = Mild depression, 10-14 = Moderate depression, 15-19 = Moderately severe depression, 20-27 = Severe depression   Psychosocial Evaluation and Intervention:   Psychosocial Re-Evaluation:     Psychosocial Re-Evaluation    Row Name 11/26/16 0844 12/23/16 1602           Psychosocial Re-Evaluation   Comments  - -  Jaysean's last day of participation was on 11/24/2016 not able to assess.      Interventions Encouraged to attend Cardiac Rehabilitation for the exercise;Stress management education  -      Continue Psychosocial Services  No  -  Psychosocial Discharge (Final Psychosocial Re-Evaluation):     Psychosocial Re-Evaluation - 12/23/16 1602      Psychosocial Re-Evaluation   Comments --  Derec's last day of participation was on 11/24/2016 not able to assess.      Vocational Rehabilitation: Provide vocational rehab assistance to qualifying candidates.   Vocational Rehab Evaluation & Intervention:     Vocational Rehab - 11/04/16 1503      Initial Vocational Rehab Evaluation & Intervention   Assessment shows need for Vocational Rehabilitation No  Pt is disabled and does not plan to return to work.      Education: Education Goals: Education classes will be provided on a weekly basis, covering required topics. Participant will state understanding/return demonstration of topics presented.  Learning Barriers/Preferences:     Learning Barriers/Preferences - 11/04/16 [id number]      Learning Barriers/Preferences   Learning Barriers None   Learning Preferences  Written Material;Video;Computer/Internet;Pictoral      Education Topics: Count Your Pulse:  -Group instruction provided by verbal instruction, demonstration, patient participation and written materials to support subject.  Instructors address importance of being able to find your pulse and how to count your pulse when at home without a heart monitor.  Patients get hands on experience counting their pulse with staff help and individually.   Heart Attack, Angina, and Risk Factor Modification:  -Group instruction provided by verbal instruction, video, and written materials to support subject.  Instructors address signs and symptoms of angina and heart attacks.    Also discuss risk factors for heart disease and how to make changes to improve heart health risk factors.   Functional Fitness:  -Group instruction provided by verbal instruction, demonstration, patient participation, and written materials to support subject.  Instructors address safety measures for doing things around the house.  Discuss how to get up and down off the floor, how to pick things up properly, how to safely get out of a chair without assistance, and balance training.   Meditation and Mindfulness:  -Group instruction provided by verbal instruction, patient participation, and written materials to support subject.  Instructor addresses importance of mindfulness and meditation practice to help reduce stress and improve awareness.  Instructor also leads participants through a meditation exercise.    Stretching for Flexibility and Mobility:  -Group instruction provided by verbal instruction, patient participation, and written materials to support subject.  Instructors lead participants through series of stretches that are designed to increase flexibility thus improving mobility.  These stretches are additional exercise for major muscle groups that are typically performed during regular warm up and cool down.   Hands Only CPR  Anytime:  -Group instruction provided by verbal instruction, video, patient participation and written materials to support subject.  Instructors co-teach with AHA video for hands only CPR.  Participants get hands on experience with mannequins.   Nutrition I class: Heart Healthy Eating:  -Group instruction provided by PowerPoint slides, verbal discussion, and written materials to support subject matter. The instructor gives an explanation and review of the Therapeutic Lifestyle Changes diet recommendations, which includes a discussion on lipid goals, dietary fat, sodium, fiber, plant stanol/sterol esters, sugar, and the components of a well-balanced, healthy diet.   Nutrition II class: Lifestyle Skills:  -Group instruction provided by PowerPoint slides, verbal discussion, and written materials to support subject matter. The instructor gives an explanation and review of label reading, grocery shopping for heart health, heart healthy recipe modifications, and ways to make healthier choices when eating out.  Diabetes Question & Answer:  -Group instruction provided by PowerPoint slides, verbal discussion, and written materials to support subject matter. The instructor gives an explanation and review of diabetes co-morbidities, pre- and post-prandial blood glucose goals, pre-exercise blood glucose goals, signs, symptoms, and treatment of hypoglycemia and hyperglycemia, and foot care basics.   Diabetes Blitz:  -Group instruction provided by PowerPoint slides, verbal discussion, and written materials to support subject matter. The instructor gives an explanation and review of the physiology behind type 1 and type 2 diabetes, diabetes medications and rational behind using different medications, pre- and post-prandial blood glucose recommendations and Hemoglobin A1c goals, diabetes diet, and exercise including blood glucose guidelines for exercising safely.    Portion Distortion:  -Group instruction  provided by PowerPoint slides, verbal discussion, written materials, and food models to support subject matter. The instructor gives an explanation of serving size versus portion size, changes in portions sizes over the last 20 years, and what consists of a serving from each food group.   Stress Management:  -Group instruction provided by verbal instruction, video, and written materials to support subject matter.  Instructors review role of stress in heart disease and how to cope with stress positively.     Exercising on Your Own:  -Group instruction provided by verbal instruction, power point, and written materials to support subject.  Instructors discuss benefits of exercise, components of exercise, frequency and intensity of exercise, and end points for exercise.  Also discuss use of nitroglycerin and activating EMS.  Review options of places to exercise outside of rehab.  Review guidelines for sex with heart disease. Flowsheet Row CARDIAC REHAB PHASE II EXERCISE from 11/24/2016 in [place]  Date  11/24/16  Instruction Review Code  2- meets goals/outcomes      Cardiac Drugs I:  -Group instruction provided by verbal instruction and written materials to support subject.  Instructor reviews cardiac drug classes: antiplatelets, anticoagulants, beta blockers, and statins.  Instructor discusses reasons, side effects, and lifestyle considerations for each drug class.   Cardiac Drugs II:  -Group instruction provided by verbal instruction and written materials to support subject.  Instructor reviews cardiac drug classes: angiotensin converting enzyme inhibitors (ACE-I), angiotensin II receptor blockers (ARBs), nitrates, and calcium channel blockers.  Instructor discusses reasons, side effects, and lifestyle considerations for each drug class.   Anatomy and Physiology of the Circulatory System:  -Group instruction provided by verbal instruction, video, and written  materials to support subject.  Reviews functional anatomy of heart, how it relates to various diagnoses, and what role the heart plays in the overall system.   Knowledge Questionnaire Score:     Knowledge Questionnaire Score - 11/04/16 1420      Knowledge Questionnaire Score   Pre Score 19/24      Core Components/Risk Factors/Patient Goals at Admission:     Personal Goals and Risk Factors at Admission - 11/04/16 0852      Core Components/Risk Factors/Patient Goals on Admission    Weight Management Yes;Weight Loss   Intervention Weight Management: Develop a combined nutrition and exercise program designed to reach desired caloric intake, while maintaining appropriate intake of nutrient and fiber, sodium and fats, and appropriate energy expenditure required for the weight goal.;Weight Management: Provide education and appropriate resources to help participant work on and attain dietary goals.;Weight Management/Obesity: Establish reasonable short term and long term weight goals.;Obesity: Provide education and appropriate resources to help participant work on and attain dietary goals.   Expected Outcomes  Short Term: Continue to assess and modify interventions until short term weight is achieved;Weight Maintenance: Understanding of the daily nutrition guidelines, which includes 25-35% calories from fat, 7% or less cal from saturated fats, less than 200mg  cholesterol, less than 1.5gm of sodium, & 5 or more servings of fruits and vegetables daily;Weight Loss: Understanding of general recommendations for a balanced deficit meal plan, which promotes 1-2 lb weight loss per week and includes a negative energy balance of [phone] kcal/d;Understanding recommendations for meals to include 15-35% energy as protein, 25-35% energy from fat, 35-60% energy from carbohydrates, less than 200mg  of dietary cholesterol, 20-35 gm of total fiber daily;Understanding of distribution of calorie intake throughout the day  with the consumption of 4-5 meals/snacks   Sedentary Yes   Intervention Provide advice, education, support and counseling about physical activity/exercise needs.;Develop an individualized exercise prescription for aerobic and resistive training based on initial evaluation findings, risk stratification, comorbidities and participant's personal goals.   Expected Outcomes Achievement of increased cardiorespiratory fitness and enhanced flexibility, muscular endurance and strength shown through measurements of functional capacity and personal statement of participant.   Increase Strength and Stamina Yes   Intervention Provide advice, education, support and counseling about physical activity/exercise needs.;Develop an individualized exercise prescription for aerobic and resistive training based on initial evaluation findings, risk stratification, comorbidities and participant's personal goals.   Expected Outcomes Achievement of increased cardiorespiratory fitness and enhanced flexibility, muscular endurance and strength shown through measurements of functional capacity and personal statement of participant.   Hypertension Yes   Intervention Monitor prescription use compliance.;Provide education on lifestyle modifcations including regular physical activity/exercise, weight management, moderate sodium restriction and increased consumption of fresh fruit, vegetables, and low fat dairy, alcohol moderation, and smoking cessation.   Expected Outcomes Short Term: Continued assessment and intervention until BP is < 140/57mm HG in hypertensive participants. < 130/48mm HG in hypertensive participants with diabetes, heart failure or chronic kidney disease.;Long Term: Maintenance of blood pressure at goal levels.   Lipids Yes   Intervention Provide education and support for participant on nutrition & aerobic/resistive exercise along with prescribed medications to achieve LDL 70mg , HDL >40mg .   Expected Outcomes Short  Term: Participant states understanding of desired cholesterol values and is compliant with medications prescribed. Participant is following exercise prescription and nutrition guidelines.;Long Term: Cholesterol controlled with medications as prescribed, with individualized exercise RX and with personalized nutrition plan. Value goals: LDL < 70mg , HDL > 40 mg.   Personal Goal Other Yes   Personal Goal Learn limitations and exercise guidelines. Decrease future occurrences.   Intervention Provide exercise programming to assist with improving exercise tolerance and cariovascular fitness. Provide cardiac education classes that aid in risk factor modifications.   Expected Outcomes Pt will be able to manage heart disease and decrease future occurrences. Pt will also be able to exercise safely on his own.      Core Components/Risk Factors/Patient Goals Review:    Core Components/Risk Factors/Patient Goals at Discharge (Final Review):    ITP Comments:     ITP Comments    Row Name 11/04/16 0850           ITP Comments Dr. Fransico Him, Medical Director          Comments: Pius's last day of participation in cardiac rehab was 11/24/2016. Kanyen has been out due to back problems.Barnet Pall, RN,BSN 12/23/2016 4:59 PM

## 2016-12-24 ENCOUNTER — Encounter (HOSPITAL_COMMUNITY)
Admission: RE | Admit: 2016-12-24 | Discharge: 2016-12-24 | Disposition: A | Discharge status: Home / Self Care | Payer: Medicaid Other | Source: Ambulatory Visit | Attending: Internal Medicine | Admitting: Internal Medicine

## 2016-12-24 DIAGNOSIS — Z48812 Encounter for surgical aftercare following surgery on the circulatory system: Secondary | ICD-10-CM | POA: Insufficient documentation

## 2016-12-24 DIAGNOSIS — Z955 Presence of coronary angioplasty implant and graft: Secondary | ICD-10-CM | POA: Insufficient documentation

## 2016-12-27 ENCOUNTER — Encounter (HOSPITAL_COMMUNITY): Payer: Medicaid Other

## 2016-12-29 ENCOUNTER — Encounter (HOSPITAL_COMMUNITY): Admission: RE | Admit: 2016-12-29 | Payer: Medicaid Other | Source: Ambulatory Visit

## 2016-12-29 ENCOUNTER — Ambulatory Visit: Payer: Medicaid Other | Admitting: Gastroenterology

## 2016-12-31 ENCOUNTER — Encounter (HOSPITAL_COMMUNITY): Admission: RE | Admit: 2016-12-31 | Payer: Medicaid Other | Source: Ambulatory Visit

## 2017-01-03 ENCOUNTER — Encounter (HOSPITAL_COMMUNITY): Payer: Medicaid Other

## 2017-01-05 ENCOUNTER — Encounter (HOSPITAL_COMMUNITY): Payer: Medicaid Other

## 2017-01-07 ENCOUNTER — Encounter (HOSPITAL_COMMUNITY): Admission: RE | Admit: 2017-01-07 | Payer: Medicaid Other | Source: Ambulatory Visit

## 2017-01-07 ENCOUNTER — Telehealth (HOSPITAL_COMMUNITY): Payer: Self-pay | Admitting: *Deleted

## 2017-01-07 ENCOUNTER — Encounter (HOSPITAL_COMMUNITY): Payer: Self-pay | Admitting: *Deleted

## 2017-01-07 DIAGNOSIS — Z955 Presence of coronary angioplasty implant and graft: Secondary | ICD-10-CM

## 2017-01-07 NOTE — Progress Notes (Signed)
Discharge Summary  Patient Details  Name: HAYWOOD MEINDERS MRN: [id number] Date of Birth: 02/05/1966 Referring Provider:     CARDIAC REHAB PHASE II ORIENTATION from 11/04/2016 in [place]  Referring Provider  Hilty, Mali MD       Number of Visits: 4  Reason for Discharge:  Early Exit:  Patient discharged for nonattendance  Smoking History:  History  Smoking Status  . Former Smoker  . Packs/day: 0.50  . Years: 25.00  . Types: Cigarettes  . Quit date: 11/26/2015  Smokeless Tobacco  . Never Used    Diagnosis:  10/11/16 Status post coronary artery stent placement  ADL UCSD:   Initial Exercise Prescription:   Discharge Exercise Prescription (Final Exercise Prescription Changes):     Exercise Prescription Changes - 12/23/16 1600      Response to Exercise   Blood Pressure (Admit) 124/62   Blood Pressure (Exercise) 148/82   Blood Pressure (Exit) 124/80   Heart Rate (Admit) 86 bpm   Heart Rate (Exercise) 109 bpm   Heart Rate (Exit) 85 bpm   Rating of Perceived Exertion (Exercise) 12   Duration Progress to 45 minutes of aerobic exercise without signs/symptoms of physical distress   Intensity THRR unchanged     Progression   Progression Continue to progress workloads to maintain intensity without signs/symptoms of physical distress.   Average METs 3.33     Resistance Training   Training Prescription Yes   Weight 3lb   Reps 10-15     Recumbant Bike   Level 2.5   Minutes 10   METs 4.2     NuStep   Level 3   Minutes 10   METs 2.9     Arm/Foot Ergometer   Level 2   Minutes 10   METs 2.9     Home Exercise Plan   Plans to continue exercise at Home (comment)   Frequency Add 3 additional days to program exercise sessions.      Functional Capacity:   Psychological, QOL, Others - Outcomes: PHQ 2/9: Depression screen [facility] 2/9 11/30/2016 11/12/2016 10/28/2016 07/28/2016 10/08/2015  Decreased Interest 2 0 3 0 0  Down, Depressed,  Hopeless 1 0 1 0 0  PHQ - 2 Score 3 0 4 0 0  Altered sleeping 3 - 3 - -  Tired, decreased energy 3 - 2 - -  Change in appetite 3 - 0 - -  Feeling bad or failure about yourself  0 - 0 - -  Trouble concentrating 0 - 0 - -  Moving slowly or fidgety/restless 3 - 0 - -  Suicidal thoughts 0 - 0 - -  PHQ-9 Score 15 - 9 - -  Some recent data might be hidden    Quality of Life:   Personal Goals: Goals established at orientation with interventions provided to work toward goal.    Personal Goals Discharge:   Nutrition & Weight - Outcomes:    Nutrition:     Nutrition Therapy & Goals - 11/15/16 1102      Nutrition Therapy   Diet Therapeutic Lifestyle Changes     Personal Nutrition Goals   Nutrition Goal 1-2 lb wt loss/week to a wt loss goal of 6-24 lb at graduation from [place], educate and counsel regarding individualized specific dietary modifications aiming towards targeted core components such as weight, hypertension, lipid management, diabetes, heart failure and other comorbidities.   Expected  Outcomes Short Term Goal: Understand basic principles of dietary content, such as calories, fat, sodium, cholesterol and nutrients.;Long Term Goal: Adherence to prescribed nutrition plan.      Nutrition Discharge:   Education Questionnaire Score:    Tyresse has not been to cardiac rehab in several weeks and is being discharged for nonattendance.Daniell last day of attendance was on 11/24/3016. Ralphael attended 4 exercise classes at cardiac rehab.Barnet Pall, RN,BSN 01/12/2017 2:50 PM

## 2017-01-10 ENCOUNTER — Encounter (HOSPITAL_COMMUNITY): Payer: Medicaid Other

## 2017-01-10 NOTE — Addendum Note (Signed)
Encounter addended by: Meta Hatchet on: 01/10/2017 12:20 PM<BR>    Actions taken: Visit Navigator Flowsheet section accepted

## 2017-01-12 ENCOUNTER — Encounter (HOSPITAL_COMMUNITY): Payer: Medicaid Other

## 2017-01-14 ENCOUNTER — Encounter (HOSPITAL_COMMUNITY): Payer: Medicaid Other

## 2017-01-17 ENCOUNTER — Encounter (HOSPITAL_COMMUNITY): Payer: Medicaid Other

## 2017-01-19 ENCOUNTER — Encounter (HOSPITAL_COMMUNITY): Payer: Medicaid Other

## 2017-01-19 LAB — LIPID PANEL
CHOL/HDL RATIO: 3.7 ratio (ref <5.0)
Cholesterol: 136 mg/dL (ref <200)
HDL: 37 mg/dL — ABNORMAL LOW (ref >40)
LDL Cholesterol: 81 mg/dL (ref <100)
Triglycerides: 91 mg/dL (ref <150)
VLDL: 18 mg/dL (ref <30)

## 2017-01-19 LAB — COMPREHENSIVE METABOLIC PANEL
ALK PHOS: 70 U/L (ref 40–115)
ALT: 13 U/L (ref 9–46)
AST: 14 U/L (ref 10–35)
Albumin: 4.2 g/dL (ref 3.6–5.1)
BILIRUBIN TOTAL: 0.3 mg/dL (ref 0.2–1.2)
BUN: 12 mg/dL (ref 7–25)
CO2: 25 mmol/L (ref 20–31)
Calcium: 9.2 mg/dL (ref 8.6–10.3)
Chloride: 105 mmol/L (ref 98–110)
Creat: 1.09 mg/dL (ref 0.70–1.33)
GLUCOSE: 89 mg/dL (ref 65–99)
Potassium: 4.2 mmol/L (ref 3.5–5.3)
SODIUM: 139 mmol/L (ref 135–146)
Total Protein: 7.2 g/dL (ref 6.1–8.1)

## 2017-01-19 LAB — BASIC METABOLIC PANEL
BUN: 12 mg/dL (ref 7–25)
CO2: 25 mmol/L (ref 20–31)
CREATININE: 1.09 mg/dL (ref 0.70–1.33)
Calcium: 9.2 mg/dL (ref 8.6–10.3)
Chloride: 105 mmol/L (ref 98–110)
Glucose, Bld: 89 mg/dL (ref 65–99)
Potassium: 4.2 mmol/L (ref 3.5–5.3)
Sodium: 139 mmol/L (ref 135–146)

## 2017-01-21 ENCOUNTER — Encounter (HOSPITAL_COMMUNITY): Payer: Medicaid Other

## 2017-01-21 ENCOUNTER — Encounter: Payer: Self-pay | Admitting: Internal Medicine

## 2017-01-21 ENCOUNTER — Ambulatory Visit (INDEPENDENT_AMBULATORY_CARE_PROVIDER_SITE_OTHER): Payer: Medicaid Other | Admitting: Internal Medicine

## 2017-01-21 VITALS — BP 118/64 | HR 69 | Ht 73.0 in | Wt 238.0 lb

## 2017-01-21 DIAGNOSIS — I2510 Atherosclerotic heart disease of native coronary artery without angina pectoris: Secondary | ICD-10-CM | POA: Diagnosis not present

## 2017-01-21 DIAGNOSIS — Z9889 Other specified postprocedural states: Secondary | ICD-10-CM | POA: Diagnosis not present

## 2017-01-21 DIAGNOSIS — B20 Human immunodeficiency virus [HIV] disease: Secondary | ICD-10-CM

## 2017-01-21 DIAGNOSIS — E782 Mixed hyperlipidemia: Secondary | ICD-10-CM

## 2017-01-21 DIAGNOSIS — Z9861 Coronary angioplasty status: Secondary | ICD-10-CM

## 2017-01-21 DIAGNOSIS — I10 Essential (primary) hypertension: Secondary | ICD-10-CM

## 2017-01-21 MED ORDER — METOPROLOL SUCCINATE ER 25 MG PO TB24: 12.5000 mg | ORAL_TABLET | Freq: Every day | ORAL | 3 refills | Status: DC

## 2017-01-21 MED ORDER — AMLODIPINE BESYLATE 2.5 MG PO TABS: 2.5000 mg | ORAL_TABLET | Freq: Every day | ORAL | 3 refills | Status: DC

## 2017-01-21 NOTE — Progress Notes (Signed)
OFFICE NOTE  Chief Complaint:  Follow-up cath  Primary Care Physician: Minerva Ends, MD  HPI:  Benjamin Herrera is a 51 y.o. male with a past medial history significant for a number of cardiac risk factors, who presents with upper mid-epigastric and central chest pain. Some of the episodes were worse after smoking cigarettes. He has a history of cocaine use and achalasia as well as biliary colic in the past. Troponins have been negative. EKG shows some new T wave inversions. There is a family history of early onset CAD in his mother. Based on this history I recommended a stress test. The stress test indicated a reduced LVEF of 47% with some inferior changes which were read as attenuation but concerning for ischemia. Cardiac catheterization was then recommended. He underwent left heart catheterization on 10/11/2016. This demonstrated severe two-vessel coronary disease with 100% mid to distal RCA occlusion and left to right Collaterals, as well as a 90% OM1 lesion which was successfully stented with a resolute Onyx 3 0 x 15 mm DES. He was placed on aspirin and Plavix and rosuvastatin 20 was started because of his concomitant use of a protease inhibitor. Since discharge she is doing very well. He saw Rosaria Ferries, PA-C in follow-up in December and was not having any chest pain. Hypertension was well controlled. He was not on beta blocker. Today he reports that he continues to feel well and denies any chest pain or worsening shortness of breath. Blood pressure is well-controlled 118/64.  PMHx:  Past Medical History:  Diagnosis Date  . Achalasia and cardiospasm   . Allergy   . Arthritis    back ,shoulders   . Asthma   . Boils    under arms hx of  . Candidiasis of mouth   . Candidiasis of unspecified site   . Cholelithiasis   . Cocaine abuse   . Condyloma acuminatum in male of scrotum & anal canal s/p laser ablation 07/03/2012  . Coronary artery disease   . Dysphagia   . Fracture,  humerus   . GERD (gastroesophageal reflux disease)   . Heart attack   . Hemorrhoids   . Hepatitis 1993   A  . Herpes labialis   . HIV (human immunodeficiency virus infection) ([place]) [place]  . Hypertension Dx 2014   off all bp meds for last 9 or 10 months  . Pilonidal disease 01/10/2012  . Polysubstance abuse   . Psoriasis    hx of  . Sleep apnea    does  have CPAP  . 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 BALLOON DILATION N/A 11/11/2015   Procedure: BALLOON DILATION;  Surgeon: Mauri Pole, MD;  Location: [facility];  Service: Endoscopy;  Laterality: N/A;  . BOTOX INJECTION  10/11/2012   Procedure: BOTOX INJECTION;  Surgeon: Inda Castle, MD;  Location: [facility];  Service: Endoscopy;  Laterality: N/A;  . BOTOX INJECTION N/A 01/25/2013   Procedure: BOTOX INJECTION;  Surgeon: Inda Castle, MD;  [facility]: [facility];  Service: Endoscopy;  Laterality: N/A;  . BOTOX INJECTION N/A 06/21/2013   Procedure: BOTOX INJECTION;  Surgeon: Inda Castle, MD;  Location: [facility];  Service: Endoscopy;  Laterality: N/A;  . BOTOX INJECTION N/A 10/08/2013   Procedure: BOTOX INJECTION;  Surgeon: Sandy Salaam  Deatra Ina, MD;  Location: [facility];  Service: Endoscopy;  Laterality: N/A;  . BOTOX INJECTION N/A 06/16/2015   Procedure: BOTOX INJECTION;  Surgeon: Inda Castle, MD;  [facility];  Service: Endoscopy;  Laterality: N/A;  . CARDIAC CATHETERIZATION N/A 10/11/2016   Procedure: Left Heart Cath and Coronary Angiography;  Surgeon: Burnell Blanks, MD;  mRCA 100% w/ L>R collaterals, OM1 90%  . CARDIAC CATHETERIZATION N/A 10/11/2016   Procedure: Coronary Stent Intervention;  Surgeon: Burnell Blanks, MD;  Hinda Lenis 4.0J81 DES OM1  . COLONOSCOPY  05/19/2012   Procedure:  COLONOSCOPY;  Surgeon: Inda Castle, MD;  [facility];  Service: Endoscopy;  Laterality: N/A;  jill trying to contact pt to come in 0830 for 930 case, phone not accepting messages  . COLONOSCOPY    . ESOPHAGEAL MANOMETRY  05/29/2012   Procedure: ESOPHAGEAL MANOMETRY (EM);  Surgeon: Inda Castle, MD;  [facility];  Service: Endoscopy;  Laterality: N/A;  . ESOPHAGEAL MANOMETRY N/A 10/06/2015   Procedure: ESOPHAGEAL MANOMETRY (EM);  Surgeon: Mauri Pole, MD;  [facility];  Service: Endoscopy;  Laterality: N/A;  . ESOPHAGOGASTRODUODENOSCOPY  11/11/2011   Procedure: ESOPHAGOGASTRODUODENOSCOPY (EGD);  Surgeon: Inda Castle, MD;  Location: [facility];  Service: Endoscopy;  Laterality: N/A;  botox injection  called Pt to change time of procedure per Dr Deatra Ina  . ESOPHAGOGASTRODUODENOSCOPY  03/10/2012   Procedure: ESOPHAGOGASTRODUODENOSCOPY (EGD);  Surgeon: Inda Castle, MD;  Location: [facility];  Service: Endoscopy;  Laterality: N/A;  . ESOPHAGOGASTRODUODENOSCOPY  10/11/2012   Procedure: ESOPHAGOGASTRODUODENOSCOPY (EGD);  Surgeon: Inda Castle, MD;  Location: [facility];  Service: Endoscopy;  Laterality: N/A;  . ESOPHAGOGASTRODUODENOSCOPY N/A 01/25/2013   Procedure: ESOPHAGOGASTRODUODENOSCOPY (EGD);  Surgeon: Inda Castle, MD;  Location: [facility];  Service: Endoscopy;  Laterality: N/A;  . ESOPHAGOGASTRODUODENOSCOPY N/A 06/21/2013   Procedure: ESOPHAGOGASTRODUODENOSCOPY (EGD);  Surgeon: Inda Castle, MD;  Location: [facility];  Service: Endoscopy;  Laterality: N/A;  . ESOPHAGOGASTRODUODENOSCOPY (EGD) WITH PROPOFOL N/A 10/08/2013   Procedure: ESOPHAGOGASTRODUODENOSCOPY (EGD) WITH PROPOFOL;  Surgeon: Inda Castle, MD;  [facility];  Service: Endoscopy;  Laterality: N/A;  . ESOPHAGOGASTRODUODENOSCOPY (EGD) WITH PROPOFOL N/A 06/16/2015   Procedure: ESOPHAGOGASTRODUODENOSCOPY (EGD) WITH PROPOFOL;  Surgeon: Inda Castle, MD;   [facility];  Service: Endoscopy;  Laterality: N/A;  . ESOPHAGOGASTRODUODENOSCOPY (EGD) WITH PROPOFOL N/A 11/11/2015   Procedure: ESOPHAGOGASTRODUODENOSCOPY (EGD) WITH PROPOFOL;  Surgeon: Mauri Pole, MD;  [facility];  Service: Endoscopy;  Laterality: N/A;  . EXAMINATION UNDER ANESTHESIA  07/24/2012   Procedure: EXAM UNDER ANESTHESIA;  Surgeon: Adin Hector, MD;  Location: [facility];  Service: General;  Laterality: N/A;  . humeral fracture surgery Right yrs ago  . PILONIDAL CYST EXCISION  07/24/2012   Procedure: CYST EXCISION PILONIDAL SIMPLE;  Surgeon: Adin Hector, MD;  Location: [facility];  Service: General;  Laterality: N/A;  Exam Under Anesthesia,, Excision Pilonidal Disease,   . right shoulder replacement  2008   x 2  . UPPER GASTROINTESTINAL ENDOSCOPY    . WART FULGURATION  07/24/2012   Procedure: FULGURATION ANAL WART;  Surgeon: Adin Hector, MD;  Location: [facility];  Service: General;  Laterality: N/A;  excision of raphe mass    FAMHx:  Family History  Problem Relation Age of Onset  . Arthritis Mother   . Hypertension Mother   . Heart disease Mother     MI age 40  . Diabetes Maternal Grandmother   .  Stomach cancer Paternal Grandmother   . Colon cancer Neg Hx   . Colon polyps Neg Hx   . Esophageal cancer Neg Hx   . Rectal cancer Neg Hx     SOCHx:   reports that he quit smoking about 13 months ago. His smoking use included Cigarettes. He has a 12.50 pack-year smoking history. He has never used smokeless tobacco. He reports that he uses drugs, including Marijuana and Cocaine, about 3 times per week. He reports that he does not drink alcohol.  ALLERGIES:  Allergies  Allergen Reactions  . Citrus Swelling    Mouth itching and swelling     ROS: Pertinent items noted in HPI and remainder of comprehensive ROS otherwise negative.  HOME MEDS: Current Outpatient Prescriptions on File Prior to Visit  Medication Sig Dispense Refill  . acetaminophen  (TYLENOL 8 HOUR) 650 MG CR tablet Take 1 tablet (650 mg total) by mouth every 8 (eight) hours. 90 tablet 3  . amLODipine (NORVASC) 5 MG tablet Take 1 tablet (5 mg total) by mouth daily. 90 tablet 3  . aspirin 81 MG chewable tablet Chew 1 tablet (81 mg total) by mouth daily. 90 tablet 3  . Blood Pressure Monitor DEVI 1 each by Does not apply route every morning. 1 Device 0  . clopidogrel (PLAVIX) 75 MG tablet Take 1 tablet (75 mg total) by mouth daily with breakfast. 90 tablet 3  . escitalopram (LEXAPRO) 10 MG tablet Take 1 tablet (10 mg total) by mouth daily. 30 tablet 2  . GENVOYA 150-150-200-10 MG TABS tablet TAKE 1 TABLET BY MOUTH DAILY WITH BREAKFAST. 90 tablet 1  . mirtazapine (REMERON) 7.5 MG tablet Take 1 tablet (7.5 mg total) by mouth at bedtime. 30 tablet 5  . Naphazoline-Pheniramine (OPCON-A OP) Apply 1-2 drops to eye 2 (two) times daily as needed (dry eyes.).     Marland Kitchen pantoprazole (PROTONIX) 40 MG tablet Take 1 tablet (40 mg total) by mouth daily. (Patient taking differently: Take 40 mg by mouth daily as needed. ) 90 tablet 3  . PREZISTA 800 MG tablet TAKE 1 TABLET (800 MG TOTAL) BY MOUTH DAILY WITH BREAKFAST. 90 tablet 1  . VOLTAREN 1 % GEL Apply 4 g topically 4 (four) times daily. (Patient taking differently: Apply 4 g topically 4 (four) times daily as needed. ) 100 g 3  . nitroGLYCERIN (NITROSTAT) 0.4 MG SL tablet Place 1 tablet (0.4 mg total) under the tongue every 5 (five) minutes as needed for chest pain. 90 tablet 3  . rosuvastatin (CRESTOR) 20 MG tablet Take 1 tablet (20 mg total) by mouth daily. 90 tablet 3   No current facility-administered medications on file prior to visit.     LABS/IMAGING: No results found for this or any previous visit (from the past 48 hour(s)). No results found.  WEIGHTS: Wt Readings from Last 3 Encounters:  01/21/17 238 lb (108 kg)  11/30/16 243 lb 6.4 oz (110.4 kg)  11/04/16 236 lb 1.8 oz (107.1 kg)    VITALS: BP 118/64   Pulse 69   Ht 6\' 1"   (1.854 m)   Wt 238 lb (108 kg)   BMI 31.40 kg/m   EXAM: General appearance: alert and no distress Neck: no carotid bruit and no JVD Lungs: clear to auscultation bilaterally Heart: regular rate and rhythm, S1, S2 normal, no murmur, click, rub or gallop Abdomen: soft, non-tender; bowel sounds normal; no masses,  no organomegaly Extremities: extremities normal, atraumatic, no cyanosis or edema Pulses: 2+  and symmetric Skin: Skin color, texture, turgor normal. No rashes or lesions Neurologic: Grossly normal Psych: Pleasant  EKG: Normal sinus rhythm at 69  ASSESSMENT: 1. CAD with occluded mid to distal RCA and left-to-right collaterals and status post PCI to OM1 with a 3.015 mm resolute onyx DES (09/2016) 2. Hypertension 3. Dyslipidemia 4. HIV- on protease inhibitors 5. OSA on CPAP  PLAN: 1.   Benjamin Herrera is doing well after stenting of his OM1. He seems to have adequate collateralization to the right coronary artery. He denies any exertional angina. Blood pressure is well-controlled. He was placed on Crestor for his dyslipidemia as it is not likely to interact with his protease inhibitor as would have Lipitor. He has OSA and compliant with CPAP. He would benefit from the addition of beta blocker given his coronary disease. I would recommend starting Toprol-XL 12.5 mg daily. As blood pressure is well controlled will decrease his amlodipine to 2.5 mg daily. Follow-up with me in one month to see if he's tolerating the medication changes and assure adequate blood pressure control.  Pixie Casino, MD, [facility] Attending Cardiologist [place] C Hiroto Saltzman 01/21/2017, 2:01 PM

## 2017-01-21 NOTE — Patient Instructions (Signed)
Medication Instructions: Your Amlodipine has been decreased to 2.5 mg daily Please start taking Toprol XL 12.5 mg  daily (half of a tablet)   Follow-Up: Your physician recommends that you schedule a follow-up appointment in: [place] DR. HILTY    If you need a refill on your cardiac medications before your next appointment, please call your pharmacy.

## 2017-01-24 ENCOUNTER — Encounter (HOSPITAL_COMMUNITY): Payer: Medicaid Other

## 2017-01-24 DIAGNOSIS — E785 Hyperlipidemia, unspecified: Secondary | ICD-10-CM | POA: Insufficient documentation

## 2017-01-26 ENCOUNTER — Encounter: Payer: Self-pay | Admitting: Infectious Diseases

## 2017-01-26 ENCOUNTER — Encounter (HOSPITAL_COMMUNITY): Payer: Medicaid Other

## 2017-01-26 ENCOUNTER — Ambulatory Visit (INDEPENDENT_AMBULATORY_CARE_PROVIDER_SITE_OTHER): Payer: Medicaid Other | Admitting: Infectious Diseases

## 2017-01-26 ENCOUNTER — Other Ambulatory Visit (HOSPITAL_COMMUNITY)
Admission: RE | Admit: 2017-01-26 | Discharge: 2017-01-26 | Disposition: A | Discharge status: Home / Self Care | Payer: Medicaid Other | Source: Ambulatory Visit | Attending: Infectious Diseases | Admitting: Infectious Diseases

## 2017-01-26 ENCOUNTER — Telehealth: Payer: Self-pay | Admitting: Internal Medicine

## 2017-01-26 VITALS — BP 105/66 | HR 84 | Temp 98.3°F | Wt 223.0 lb

## 2017-01-26 DIAGNOSIS — E782 Mixed hyperlipidemia: Secondary | ICD-10-CM | POA: Diagnosis not present

## 2017-01-26 DIAGNOSIS — Z79899 Other long term (current) drug therapy: Secondary | ICD-10-CM

## 2017-01-26 DIAGNOSIS — Z113 Encounter for screening for infections with a predominantly sexual mode of transmission: Secondary | ICD-10-CM

## 2017-01-26 DIAGNOSIS — D0761 Carcinoma in situ of scrotum: Secondary | ICD-10-CM

## 2017-01-26 DIAGNOSIS — F329 Major depressive disorder, single episode, unspecified: Secondary | ICD-10-CM

## 2017-01-26 DIAGNOSIS — F418 Other specified anxiety disorders: Secondary | ICD-10-CM

## 2017-01-26 DIAGNOSIS — D0769 Carcinoma in situ of other male genital organs: Secondary | ICD-10-CM

## 2017-01-26 DIAGNOSIS — B20 Human immunodeficiency virus [HIV] disease: Secondary | ICD-10-CM | POA: Diagnosis not present

## 2017-01-26 DIAGNOSIS — F419 Anxiety disorder, unspecified: Secondary | ICD-10-CM

## 2017-01-26 LAB — LIPID PANEL
Cholesterol: 144 mg/dL (ref <200)
HDL: 33 mg/dL — ABNORMAL LOW (ref >40)
LDL CALC: 90 mg/dL (ref <100)
Total CHOL/HDL Ratio: 4.4 Ratio (ref <5.0)
Triglycerides: 104 mg/dL (ref <150)
VLDL: 21 mg/dL (ref <30)

## 2017-01-26 NOTE — Telephone Encounter (Signed)
Returning your call. °

## 2017-01-26 NOTE — Assessment & Plan Note (Signed)
Now on statin, my appreciation to [facility]

## 2017-01-26 NOTE — Assessment & Plan Note (Signed)
Doing well Swears adherence.  Given condoms Refuses mening vax Check labs today Will see him in 6 months.

## 2017-01-26 NOTE — Progress Notes (Signed)
   Subjective:    Patient ID: Benjamin Herrera, male    DOB: 07-06-1966, 51 y.o.   MRN: [id number]  HPI 51 yo M with HIV+, anal condyloma, achalasia, HTN. R shoulder pain (previous shoulder prosthesis).  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 Was taking TRV/ISN, had DRVr added at his visit 06-09-12. Then changed to tivicay and complera. At his f/u 09-2015 was changed to genvoya/darunavir due to need for ppi.  He underwent stress test (positive) then cath and PTCA of OM1. 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 He was started on beta-blocker and crestor at his [facility] f/u (01-21-17).  He has been doing well but has been having difficulty with anxiety/depression rx. Feels like the betablocker has made his mental health worse (has been sleeping all day). Wants to be back on anxiolytic he was on in hospital.     HIV 1 RNA Quant (copies/mL)  Date Value  07/14/2016 <20  01/07/2016 <20  03/17/2015 24 (H)   CD4 T Cell Abs (/uL)  Date Value  07/14/2016 330 (L)  01/07/2016 360 (L)  03/17/2015 360 (L)    Review of Systems  Constitutional: Negative for appetite change and unexpected weight change.  Respiratory: Negative for shortness of breath.   Cardiovascular: Negative for chest pain and leg swelling.  Gastrointestinal: Negative for constipation and diarrhea.  Genitourinary: Negative for difficulty urinating.  Psychiatric/Behavioral: Positive for sleep disturbance.  has had sore throat, swelling. Worse at night.  Please see HPI. 12 point ROS o/w (-)     Objective:   Physical Exam  Constitutional: He appears well-developed and well-nourished.  HENT:  Mild clear pharyngeal/sinus d/c.   Eyes: EOM are normal. Pupils are equal, round, and reactive to light.  Cardiovascular: Normal rate, regular rhythm and normal heart sounds.   Pulmonary/Chest: Effort normal and breath sounds normal.  Abdominal: Soft. Bowel sounds are normal. There is no tenderness. There is no rebound.    Musculoskeletal: He exhibits no edema.  Lymphadenopathy:    He has no cervical adenopathy.      Assessment & Plan:

## 2017-01-26 NOTE — Assessment & Plan Note (Signed)
Asked pt to touch base with CV for alternative to toprol,, has made his mental state/sleep worse.

## 2017-01-26 NOTE — Assessment & Plan Note (Signed)
Denies recent recurrence.

## 2017-01-26 NOTE — Telephone Encounter (Signed)
Pt of Dr. Debara Pickett Taking metoprolol Reporting undesired SE's (fatigue)  Spoke to patient. He notes he's generally very sensitive to medications. He's taking the metoprolol at around 9pm when he takes his evening meds. He voiced that he's very tired with this medication and feels like he's sleeping too much. He doesn't want to take the med earlier, for fear that it will make him tired earlier in the day.  Reviewed with him and noted that he was started on this med just a few days ago (seen on 3/30 and prescribed at visit). Informed him that metoprolol and BB often give initial symptom of sluggishness and fatigue, esp in the 1st week or two of use. Recommended he continue to take, as this SE should gradually diminish. He's aware I'll route to Dr. Debara Pickett for further recommendations and will give follow up phone call.  Patient expressed understanding and thanks.

## 2017-01-26 NOTE — Telephone Encounter (Signed)
Left msg to call.

## 2017-01-26 NOTE — Telephone Encounter (Signed)
I agree - it is a very low dose. He should give it a few more weeks. If it remains intolerable, we can stop it.  Dr. Lemmie Evens

## 2017-01-26 NOTE — Telephone Encounter (Signed)
New message   Pt c/o medication issue:  1. Name of Medication: metoprolol   2. How are you currently taking this medication (dosage and times per day)? Once daily  3. Are you having a reaction (difficulty breathing--STAT)?   4. What is your medication issue? Pt said his body can not handle this medication. He says it makes him very sleepy during the day.

## 2017-01-27 LAB — RPR

## 2017-01-27 LAB — T-HELPER CELL (CD4) - (RCID CLINIC ONLY)
CD4 % Helper T Cell: 20 % — ABNORMAL LOW (ref 33–55)
CD4 T Cell Abs: 470 /uL (ref 400–2700)

## 2017-01-27 NOTE — Telephone Encounter (Signed)
Spoke with pt, aware of dr hilty's recommendations. He will call back if he continues to have problems.

## 2017-01-28 ENCOUNTER — Encounter (HOSPITAL_COMMUNITY): Payer: Medicaid Other

## 2017-01-28 ENCOUNTER — Telehealth: Payer: Self-pay | Admitting: *Deleted

## 2017-01-28 LAB — URINE CYTOLOGY ANCILLARY ONLY
Chlamydia: NEGATIVE
Neisseria Gonorrhea: NEGATIVE

## 2017-01-28 LAB — HIV-1 RNA QUANT-NO REFLEX-BLD
HIV 1 RNA Quant: 31 copies/mL — ABNORMAL HIGH
HIV-1 RNA QUANT, LOG: 1.49 {Log_copies}/mL — AB

## 2017-01-28 NOTE — Telephone Encounter (Signed)
Patient called requesting a medication for seasonal allergies. He thought something had been sent to his pharmacy. Please advise

## 2017-01-31 ENCOUNTER — Encounter (HOSPITAL_COMMUNITY): Payer: Medicaid Other

## 2017-01-31 NOTE — Telephone Encounter (Signed)
Try OTC claritin or zyrtec

## 2017-01-31 NOTE — Telephone Encounter (Signed)
Patient notified

## 2017-02-02 ENCOUNTER — Encounter (HOSPITAL_COMMUNITY): Payer: Medicaid Other

## 2017-02-04 ENCOUNTER — Encounter (HOSPITAL_COMMUNITY): Payer: Medicaid Other

## 2017-02-07 ENCOUNTER — Encounter (HOSPITAL_COMMUNITY): Payer: Medicaid Other

## 2017-02-09 ENCOUNTER — Encounter (HOSPITAL_COMMUNITY): Payer: Medicaid Other

## 2017-02-11 ENCOUNTER — Encounter (HOSPITAL_COMMUNITY): Payer: Medicaid Other

## 2017-02-24 ENCOUNTER — Ambulatory Visit (INDEPENDENT_AMBULATORY_CARE_PROVIDER_SITE_OTHER): Payer: Medicaid Other | Admitting: Internal Medicine

## 2017-02-24 ENCOUNTER — Encounter: Payer: Self-pay | Admitting: Internal Medicine

## 2017-02-24 VITALS — BP 120/72 | HR 82 | Ht 73.0 in | Wt 241.0 lb

## 2017-02-24 DIAGNOSIS — E782 Mixed hyperlipidemia: Secondary | ICD-10-CM

## 2017-02-24 DIAGNOSIS — I10 Essential (primary) hypertension: Secondary | ICD-10-CM

## 2017-02-24 DIAGNOSIS — I2510 Atherosclerotic heart disease of native coronary artery without angina pectoris: Secondary | ICD-10-CM

## 2017-02-24 NOTE — Patient Instructions (Signed)
Your physician wants you to follow-up in: December 2018 with Dr. Debara Pickett. You will receive a reminder letter in the mail two months in advance. If you don't receive a letter, please call our office to schedule the follow-up appointment.

## 2017-02-25 NOTE — Progress Notes (Signed)
OFFICE NOTE  Chief Complaint:  Follow-up blood pressure  Primary Care Physician: Minerva Ends, MD  HPI:  Benjamin Herrera is a 51 y.o. male with a past medial history significant for a number of cardiac risk factors, who presents with upper mid-epigastric and central chest pain. Some of the episodes were worse after smoking cigarettes. He has a history of cocaine use and achalasia as well as biliary colic in the past. Troponins have been negative. EKG shows some new T wave inversions. There is a family history of early onset CAD in his mother. Based on this history I recommended a stress test. The stress test indicated a reduced LVEF of 47% with some inferior changes which were read as attenuation but concerning for ischemia. Cardiac catheterization was then recommended. He underwent left heart catheterization on 10/11/2016. This demonstrated severe two-vessel coronary disease with 100% mid to distal RCA occlusion and left to right Collaterals, as well as a 90% OM1 lesion which was successfully stented with a resolute Onyx 3 0 x 15 mm DES. He was placed on aspirin and Plavix and rosuvastatin 20 was started because of his concomitant use of a protease inhibitor. Since discharge she is doing very well. He saw Rosaria Ferries, PA-C in follow-up in December and was not having any chest pain. Hypertension was well controlled. He was not on beta blocker. Today he reports that he continues to feel well and denies any chest pain or worsening shortness of breath. Blood pressure is well-controlled 118/64.  02/24/2017  [name] returns today for follow-up. We readjusted his medications last time and his blood pressure is much better today 120/72. Overall he feels well denies any chest pain or worsening shortness of breath. His stent was placed in December 2017 and would need to remain on dual antiplatelet therapy at least until December 2018. He is on Crestor which is not thought to interact with his  protease inhibitors.  PMHx:  Past Medical History:  Diagnosis Date  . Achalasia and cardiospasm   . Allergy   . Arthritis    back ,shoulders   . Asthma   . Boils    under arms hx of  . Candidiasis of mouth   . Candidiasis of unspecified site   . Cholelithiasis   . Cocaine abuse   . Condyloma acuminatum in male of scrotum & anal canal s/p laser ablation 07/03/2012  . Coronary artery disease   . Dysphagia   . Fracture, humerus   . GERD (gastroesophageal reflux disease)   . Heart attack ([place])   . Hemorrhoids   . Hepatitis 1993   A  . Herpes labialis   . HIV (human immunodeficiency virus infection) ([place]  . Hypertension Dx 2014   off all bp meds for last 9 or 10 months  . Pilonidal disease 01/10/2012  . Polysubstance abuse   . Psoriasis    hx of  . Sleep apnea    does  have CPAP  . 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 BALLOON DILATION N/A 11/11/2015   Procedure: BALLOON DILATION;  Surgeon: Mauri Pole, MD;  Location: [facility];  Service: Endoscopy;  Laterality: N/A;  . BOTOX INJECTION  10/11/2012   Procedure: BOTOX INJECTION;  Surgeon: Inda Castle, MD;  Location: [facility];  Service: Endoscopy;  Laterality: N/A;  . BOTOX INJECTION N/A 01/25/2013   Procedure: BOTOX INJECTION;  Surgeon: Inda Castle, MD;  Location: [facility];  Service: Endoscopy;  Laterality: N/A;  . BOTOX INJECTION N/A 06/21/2013   Procedure: BOTOX INJECTION;  Surgeon: Inda Castle, MD;  Location: [facility];  Service: Endoscopy;  Laterality: N/A;  . BOTOX INJECTION N/A 10/08/2013   Procedure: BOTOX INJECTION;  Surgeon: Inda Castle, MD;  Location: [facility];  Service: Endoscopy;  Laterality: N/A;  . BOTOX INJECTION N/A 06/16/2015   Procedure: BOTOX INJECTION;  Surgeon: Inda Castle, MD;   Location: [facility];  Service: Endoscopy;  Laterality: N/A;  . CARDIAC CATHETERIZATION N/A 10/11/2016   Procedure: Left Heart Cath and Coronary Angiography;  Surgeon: Burnell Blanks, MD;  mRCA 100% w/ L>R collaterals, OM1 90%  . CARDIAC CATHETERIZATION N/A 10/11/2016   Procedure: Coronary Stent Intervention;  Surgeon: Burnell Blanks, MD;  Hinda Lenis 5.7Q46 DES OM1  . COLONOSCOPY  05/19/2012   Procedure: COLONOSCOPY;  Surgeon: Inda Castle, MD;  [facility]: [facility];  Service: Endoscopy;  Laterality: N/A;  jill trying to contact pt to come in 0830 for 930 case, phone not accepting messages  . COLONOSCOPY    . ESOPHAGEAL MANOMETRY  05/29/2012   Procedure: ESOPHAGEAL MANOMETRY (EM);  Surgeon: Inda Castle, MD;  [facility]: [facility];  Service: Endoscopy;  Laterality: N/A;  . ESOPHAGEAL MANOMETRY N/A 10/06/2015   Procedure: ESOPHAGEAL MANOMETRY (EM);  Surgeon: Mauri Pole, MD;  [facility]: [facility];  Service: Endoscopy;  Laterality: N/A;  . ESOPHAGOGASTRODUODENOSCOPY  11/11/2011   Procedure: ESOPHAGOGASTRODUODENOSCOPY (EGD);  Surgeon: Inda Castle, MD;  [facility];  Service: Endoscopy;  Laterality: N/A;  botox injection  called Pt to change time of procedure per Dr Deatra Ina  . ESOPHAGOGASTRODUODENOSCOPY  03/10/2012   Procedure: ESOPHAGOGASTRODUODENOSCOPY (EGD);  Surgeon: Inda Castle, MD;  Location: [facility];  Service: Endoscopy;  Laterality: N/A;  . ESOPHAGOGASTRODUODENOSCOPY  10/11/2012   Procedure: ESOPHAGOGASTRODUODENOSCOPY (EGD);  Surgeon: Inda Castle, MD;  Location: [facility];  Service: Endoscopy;  Laterality: N/A;  . ESOPHAGOGASTRODUODENOSCOPY N/A 01/25/2013   Procedure: ESOPHAGOGASTRODUODENOSCOPY (EGD);  Surgeon: Inda Castle, MD;  Location: [facility];  Service: Endoscopy;  Laterality: N/A;  . ESOPHAGOGASTRODUODENOSCOPY N/A 06/21/2013   Procedure: ESOPHAGOGASTRODUODENOSCOPY (EGD);  Surgeon: Inda Castle, MD;  Location: [facility];  Service: Endoscopy;  Laterality: N/A;  . ESOPHAGOGASTRODUODENOSCOPY (EGD) WITH PROPOFOL N/A 10/08/2013   Procedure: ESOPHAGOGASTRODUODENOSCOPY (EGD) WITH PROPOFOL;  Surgeon: Inda Castle, MD;  [facility];  Service: Endoscopy;  Laterality: N/A;  . ESOPHAGOGASTRODUODENOSCOPY (EGD) WITH PROPOFOL N/A 06/16/2015   Procedure: ESOPHAGOGASTRODUODENOSCOPY (EGD) WITH PROPOFOL;  Surgeon: Inda Castle, MD;  [facility];  Service: Endoscopy;  Laterality: N/A;  . ESOPHAGOGASTRODUODENOSCOPY (EGD) WITH PROPOFOL N/A 11/11/2015   Procedure: ESOPHAGOGASTRODUODENOSCOPY (EGD) WITH PROPOFOL;  Surgeon: Mauri Pole, MD;  Location: [facility];  Service: Endoscopy;  Laterality: N/A;  . EXAMINATION UNDER ANESTHESIA  07/24/2012   Procedure: EXAM UNDER ANESTHESIA;  Surgeon: Adin Hector, MD;  Location: [facility];  Service: General;  Laterality: N/A;  . humeral fracture surgery Right yrs ago  . PILONIDAL CYST EXCISION  07/24/2012   Procedure: CYST EXCISION PILONIDAL SIMPLE;  Surgeon: Adin Hector, MD;  Location: [facility];  Service: General;  Laterality: N/A;  Exam Under Anesthesia,, Excision Pilonidal Disease,   . right shoulder replacement  2008   x 2  . UPPER GASTROINTESTINAL ENDOSCOPY    .  WART FULGURATION  07/24/2012   Procedure: FULGURATION ANAL WART;  Surgeon: Adin Hector, MD;  Location: [facility];  Service: General;  Laterality: N/A;  excision of raphe mass    FAMHx:  Family History  Problem Relation Age of Onset  . Arthritis Mother   . Hypertension Mother   . Heart disease Mother     MI age 57  . Diabetes Maternal Grandmother   . Stomach cancer Paternal Grandmother   . Colon cancer Neg Hx   . Colon polyps Neg Hx   . Esophageal cancer Neg Hx   . Rectal cancer Neg Hx     SOCHx:   reports that he quit smoking about 15 months ago. His smoking use included Cigarettes. He has a 12.50 pack-year smoking history. He has never used smokeless tobacco. He reports that  he uses drugs, including Marijuana and Cocaine, about 3 times per week. He reports that he does not drink alcohol.  ALLERGIES:  Allergies  Allergen Reactions  . Citrus Swelling    Mouth itching and swelling     ROS: Pertinent items noted in HPI and remainder of comprehensive ROS otherwise negative.  HOME MEDS: Current Outpatient Prescriptions on File Prior to Visit  Medication Sig Dispense Refill  . acetaminophen (TYLENOL 8 HOUR) 650 MG CR tablet Take 1 tablet (650 mg total) by mouth every 8 (eight) hours. 90 tablet 3  . amLODipine (NORVASC) 2.5 MG tablet Take 1 tablet (2.5 mg total) by mouth daily. 90 tablet 3  . aspirin 81 MG chewable tablet Chew 1 tablet (81 mg total) by mouth daily. 90 tablet 3  . Blood Pressure Monitor DEVI 1 each by Does not apply route every morning. 1 Device 0  . clopidogrel (PLAVIX) 75 MG tablet Take 1 tablet (75 mg total) by mouth daily with breakfast. 90 tablet 3  . GENVOYA 150-150-200-10 MG TABS tablet TAKE 1 TABLET BY MOUTH DAILY WITH BREAKFAST. 90 tablet 1  . metoprolol succinate (TOPROL XL) 25 MG 24 hr tablet Take 0.5 tablets (12.5 mg total) by mouth daily. 45 tablet 3  . Naphazoline-Pheniramine (OPCON-A OP) Apply 1-2 drops to eye 2 (two) times daily as needed (dry eyes.).     Marland Kitchen pantoprazole (PROTONIX) 40 MG tablet Take 1 tablet (40 mg total) by mouth daily. (Patient taking differently: Take 40 mg by mouth daily as needed. ) 90 tablet 3  . PREZISTA 800 MG tablet TAKE 1 TABLET (800 MG TOTAL) BY MOUTH DAILY WITH BREAKFAST. 90 tablet 1  . VOLTAREN 1 % GEL Apply 4 g topically 4 (four) times daily. (Patient taking differently: Apply 4 g topically 4 (four) times daily as needed. ) 100 g 3  . nitroGLYCERIN (NITROSTAT) 0.4 MG SL tablet Place 1 tablet (0.4 mg total) under the tongue every 5 (five) minutes as needed for chest pain. 90 tablet 3  . rosuvastatin (CRESTOR) 20 MG tablet Take 1 tablet (20 mg total) by mouth daily. 90 tablet 3   No current  facility-administered medications on file prior to visit.     LABS/IMAGING: No results found for this or any previous visit (from the past 48 hour(s)). No results found.  WEIGHTS: Wt Readings from Last 3 Encounters:  02/24/17 241 lb (109.3 kg)  01/26/17 223 lb (101.2 kg)  01/21/17 238 lb (108 kg)    VITALS: BP 120/72 (BP Location: Left Arm, Patient Position: Sitting, Cuff Size: Large)   Pulse 82   Ht 6\' 1"  (1.854 m)   Wt 241  lb (109.3 kg)   BMI 31.80 kg/m   EXAM: General appearance: alert and no distress Lungs: clear to auscultation bilaterally Heart: regular rate and rhythm, S1, S2 normal, no murmur, click, rub or gallop Extremities: extremities normal, atraumatic, no cyanosis or edema Neurologic: Grossly normal  EKG: Deferred  ASSESSMENT: 1. CAD with occluded mid to distal RCA and left-to-right collaterals and status post PCI to OM1 with a 3.015 mm resolute onyx DES (09/2016) 2. Hypertension 3. Dyslipidemia 4. HIV- on protease inhibitors 5. OSA on CPAP  PLAN: 1.   Mr. Launer seems to be doing better on his beta blocker. Initially had some fatigue with it but I encouraged him to continue with that as that will improve. I decreased his amlodipine which is help with swelling. Blood pressure is at goal today. We'll follow-up in December which time I can likely discontinue his clopidogrel as it will be one year since his stent.  Pixie Casino, MD, [facility] Attending Cardiologist [place] 02/25/2017, 9:41 AM

## 2017-03-01 ENCOUNTER — Telehealth: Payer: Self-pay | Admitting: Family Medicine

## 2017-03-01 DIAGNOSIS — M545 Low back pain: Principal | ICD-10-CM

## 2017-03-01 DIAGNOSIS — G8929 Other chronic pain: Secondary | ICD-10-CM

## 2017-03-01 NOTE — Telephone Encounter (Signed)
PT called since he had the MRI more than a month ago an can still not walk well, he need to know what is going on and what he need to do since no one call him with the result of the MRI, please follou up

## 2017-03-02 NOTE — Telephone Encounter (Signed)
I have no results for pt MRI. Please follow up.

## 2017-03-03 NOTE — Telephone Encounter (Signed)
MR was ordered but not done Please try to schedule

## 2017-03-07 ENCOUNTER — Telehealth: Payer: Self-pay | Admitting: Family Medicine

## 2017-03-07 NOTE — Telephone Encounter (Signed)
Pt calling to inquire as to when he may be able to get his appointment for his MRI stating that he is having a lot of pain. Please f/u with patient. Thank you.

## 2017-03-07 NOTE — Telephone Encounter (Signed)
Pt MRI was denied by [employer].

## 2017-03-08 NOTE — Telephone Encounter (Signed)
Thank you, please inform patient Perhaps ortho will be able to get it approved.

## 2017-03-09 ENCOUNTER — Encounter: Payer: Self-pay | Admitting: Family Medicine

## 2017-03-10 ENCOUNTER — Telehealth: Payer: Self-pay | Admitting: Family Medicine

## 2017-03-10 DIAGNOSIS — M25551 Pain in right hip: Secondary | ICD-10-CM

## 2017-03-10 NOTE — Telephone Encounter (Signed)
Called back to patient Verified name and DOB Advised f/u for repeat evaluation His will get x-ray for R hip Will re order MRI of lumbar spine and R hip  He has appt for next week and voices understanding

## 2017-03-10 NOTE — Telephone Encounter (Signed)
Pt calling in again to request an MRI. Requests a call to explain process to get an MRI. Thank you.

## 2017-03-18 ENCOUNTER — Encounter: Payer: Self-pay | Admitting: Family Medicine

## 2017-03-18 ENCOUNTER — Ambulatory Visit: Payer: Medicaid Other | Attending: Family Medicine | Admitting: Family Medicine

## 2017-03-18 VITALS — BP 122/71 | HR 72 | Temp 98.3°F | Wt 248.2 lb

## 2017-03-18 DIAGNOSIS — G8929 Other chronic pain: Secondary | ICD-10-CM

## 2017-03-18 DIAGNOSIS — M5126 Other intervertebral disc displacement, lumbar region: Secondary | ICD-10-CM | POA: Insufficient documentation

## 2017-03-18 DIAGNOSIS — R1030 Lower abdominal pain, unspecified: Secondary | ICD-10-CM

## 2017-03-18 DIAGNOSIS — M545 Low back pain: Secondary | ICD-10-CM | POA: Diagnosis not present

## 2017-03-18 LAB — HEMOCCULT GUIAC POC 1CARD (OFFICE): Fecal Occult Blood, POC: NEGATIVE

## 2017-03-18 NOTE — Patient Instructions (Addendum)
Benjamin Herrera was seen today for follow-up.  Diagnoses and all orders for this visit:  Lower abdominal pain -     Hemoccult - 1 Card (office)  Chronic bilateral low back pain without sciatica -     MR Lumbar Spine Wo Contrast; Future -     Drug Screen, Urine  Protrusion of lumbar intervertebral disc -     MR Lumbar Spine Wo Contrast; Future -     Drug Screen, Urine   Stool card is negative for blood Please complete R hip x-ray You will be called with MRI appointment  I can prescribe narcotic pain medicine for chronic pain  if urine drug screen is negative   F/u in 4 weeks for back pain   Dr. Adrian Blackwater

## 2017-03-18 NOTE — Progress Notes (Signed)
Subjective:  Patient ID: Benjamin Herrera, male    DOB: 1966/10/09  Age: 51 y.o. MRN: [id number]  CC: Follow-up   HPI ARIF AMENDOLA has HIV CAD, HTN he presents for    1. R low back pain: Pain present for past 3 years.  He is able to walk short distances. He has pain with standing. Pain is 6/10 with standing. Up to >10/10 with walking. No pain with sitting. He denies previous injury. He denies fecal or urinary incontinence. He reports R leg weakness compared to the L. He reports tingling in both feet that comes and goes. He denies numbness. He has associated R lateral hip pain that has improved. He takes tylenol for pain. He request tramadol. He had MRI in 2015 that revealed broad-based disc protrusion at L4-5, and L5-S1.   MRI lumbar spine 10/24/2014: IMPRESSION: 1. Mild broad-based disc protrusion and facet hypertrophy with subarticular stenosis bilaterally and mild left foraminal narrowing at L4-5. 2. Mild broad-based disc protrusion potentially contacts the exiting L5 nerve roots bilaterally at L5-S1. 3. 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  DG lumbar spine 12/01/2016: IMPRESSION: Negative for age radiographic appearance of the lumbar spine.  2. Abdominal pain: sharp midline lower abdominal pain. Severe. Last for less than a month. Coming and going for the past 5 years. No associated fever, chills dysuria, nausea, emesis, constipation or blood in stool. He reports being concerned about appendicitis.    Social History  Substance Use Topics  . Smoking status: Former Smoker    Packs/day: 0.50    Years: 25.00    Types: Cigarettes    Quit date: 11/26/2015  . Smokeless tobacco: Never Used  . Alcohol use No   Outpatient Medications Prior to Visit  Medication Sig Dispense Refill  . acetaminophen (TYLENOL 8 HOUR) 650 MG CR tablet Take 1 tablet (650 mg total) by mouth every 8 (eight) hours. 90 tablet 3  . amLODipine (NORVASC) 2.5 MG tablet Take 1 tablet (2.5 mg total) by mouth daily. 90  tablet 3  . aspirin 81 MG chewable tablet Chew 1 tablet (81 mg total) by mouth daily. 90 tablet 3  . Blood Pressure Monitor DEVI 1 each by Does not apply route every morning. 1 Device 0  . clopidogrel (PLAVIX) 75 MG tablet Take 1 tablet (75 mg total) by mouth daily with breakfast. 90 tablet 3  . GENVOYA 150-150-200-10 MG TABS tablet TAKE 1 TABLET BY MOUTH DAILY WITH BREAKFAST. 90 tablet 1  . metoprolol succinate (TOPROL XL) 25 MG 24 hr tablet Take 0.5 tablets (12.5 mg total) by mouth daily. 45 tablet 3  . Naphazoline-Pheniramine (OPCON-A OP) Apply 1-2 drops to eye 2 (two) times daily as needed (dry eyes.).     Marland Kitchen nitroGLYCERIN (NITROSTAT) 0.4 MG SL tablet Place 1 tablet (0.4 mg total) under the tongue every 5 (five) minutes as needed for chest pain. 90 tablet 3  . pantoprazole (PROTONIX) 40 MG tablet Take 1 tablet (40 mg total) by mouth daily. (Patient taking differently: Take 40 mg by mouth daily as needed. ) 90 tablet 3  . PREZISTA 800 MG tablet TAKE 1 TABLET (800 MG TOTAL) BY MOUTH DAILY WITH BREAKFAST. 90 tablet 1  . rosuvastatin (CRESTOR) 20 MG tablet Take 1 tablet (20 mg total) by mouth daily. 90 tablet 3  . VOLTAREN 1 % GEL Apply 4 g topically 4 (four) times daily. (Patient taking differently: Apply 4 g topically 4 (four) times daily as needed. ) 100 g 3  No facility-administered medications prior to visit.     ROS Review of Systems  Constitutional: Negative for chills, fatigue, fever and unexpected weight change.  Eyes: Negative for visual disturbance.  Respiratory: Negative for cough and shortness of breath.   Cardiovascular: Negative for chest pain, palpitations and leg swelling.  Gastrointestinal: Positive for abdominal pain. Negative for blood in stool, constipation, diarrhea, nausea and vomiting.  Endocrine: Negative for polydipsia, polyphagia and polyuria.  Musculoskeletal: Positive for arthralgias (wrist ), back pain and gait problem. Negative for myalgias and neck pain.    Skin: Negative for rash.  Allergic/Immunologic: Negative for immunocompromised state.  Hematological: Negative for adenopathy. Does not bruise/bleed easily.  Psychiatric/Behavioral: Positive for sleep disturbance. Negative for dysphoric mood and suicidal ideas. The patient is nervous/anxious.     Objective:  BP 122/71   Pulse 72   Temp 98.3 F (36.8 C) (Oral)   Wt 248 lb 3.2 oz (112.6 kg)   SpO2 95%   BMI 32.75 kg/m   BP/Weight 03/18/2017 0/0/7622 03/27/3353  Systolic BP 562 563 893  Diastolic BP 71 72 66  Wt. (Lbs) 248.2 241 223  BMI 32.75 31.8 29.42    Physical Exam  Constitutional: He appears well-developed and well-nourished. No distress.  HENT:  Head: Normocephalic and atraumatic.  Neck: Normal range of motion. Neck supple.  Cardiovascular: Normal rate, regular rhythm, normal heart sounds and intact distal pulses.   Pulmonary/Chest: Effort normal and breath sounds normal.  Abdominal: There is no tenderness. There is no tenderness at McBurney's point.  Genitourinary: Rectal exam shows guaiac negative stool.  Musculoskeletal: He exhibits no edema.       Right hip: He exhibits tenderness (R lateral and hamstring tenderness ). He exhibits normal range of motion, normal strength, no bony tenderness, no swelling, no crepitus, no deformity and no laceration.  Back Exam: Back: Normal Curvature, no deformities or CVA tenderness  Paraspinal Tenderness: b/l lower lumbar spine   LE Strength 5/5  LE Sensation: in tact  LE Reflexes 2+ and symmetric  Straight leg raise: negative    Neurological: He is alert. He displays no Babinski's sign on the right side. He displays no Babinski's sign on the left side.  Reflex Scores:      Patellar reflexes are 1+ on the right side and 2+ on the left side. Skin: Skin is warm and dry. No rash noted. No erythema.  Psychiatric: He has a normal mood and affect.   Depression screen [facility] 2/9 01/26/2017 11/30/2016 11/12/2016  Decreased Interest 0 2 0   Down, Depressed, Hopeless 0 1 0  PHQ - 2 Score 0 3 0  Altered sleeping - 3 -  Tired, decreased energy - 3 -  Change in appetite - 3 -  Feeling bad or failure about yourself  - 0 -  Trouble concentrating - 0 -  Moving slowly or fidgety/restless - 3 -  Suicidal thoughts - 0 -  PHQ-9 Score - 15 -  Some recent data might be hidden   GAD 7 : Generalized Anxiety Score 11/30/2016 10/28/2016  Nervous, Anxious, on Edge 2 3  Control/stop worrying 2 3  Worry too much - different things 2 1  Trouble relaxing 1 1  Restless 1 0  Easily annoyed or irritable 1 1  Afraid - awful might happen 3 3  Total GAD 7 Score 12 12     Assessment & Plan:  Cheron was seen today for follow-up.  Diagnoses and all orders for this visit:  Lower abdominal pain -     Hemoccult - 1 Card (office)  Chronic bilateral low back pain without sciatica -     MR Lumbar Spine Wo Contrast; Future -     Drug Screen, Urine  Protrusion of lumbar intervertebral disc -     MR Lumbar Spine Wo Contrast; Future -     Drug Screen, Urine   There are no diagnoses linked to this encounter.  No orders of the defined types were placed in this encounter.   Follow-up: Return in about 4 weeks (around 04/15/2017) for back pain .   Boykin Nearing MD

## 2017-03-21 NOTE — Assessment & Plan Note (Addendum)
Chronic low back pain with walking in lumbar spine Normal x-ray Known lumbar disc protrusion on previous MRI  Plan: Repeat MRI UDS if negative for drugs of abuse will consider tramadol for chronic pain management

## 2017-03-29 ENCOUNTER — Ambulatory Visit (HOSPITAL_COMMUNITY)
Admission: RE | Admit: 2017-03-29 | Discharge: 2017-03-29 | Disposition: A | Discharge status: Home / Self Care | Payer: Medicaid Other | Source: Ambulatory Visit | Attending: Family Medicine | Admitting: Family Medicine

## 2017-03-29 DIAGNOSIS — M25551 Pain in right hip: Secondary | ICD-10-CM | POA: Diagnosis present

## 2017-03-30 ENCOUNTER — Telehealth: Payer: Self-pay

## 2017-03-30 NOTE — Telephone Encounter (Signed)
Pt was called and informed of lab results and MRI appointment.

## 2017-04-06 ENCOUNTER — Ambulatory Visit (HOSPITAL_COMMUNITY): Admission: RE | Admit: 2017-04-06 | Payer: Medicaid Other | Source: Ambulatory Visit

## 2017-04-15 ENCOUNTER — Ambulatory Visit (HOSPITAL_COMMUNITY)
Admission: RE | Admit: 2017-04-15 | Discharge: 2017-04-15 | Disposition: A | Discharge status: Home / Self Care | Payer: Medicaid Other | Source: Ambulatory Visit | Attending: Family Medicine | Admitting: Family Medicine

## 2017-04-15 DIAGNOSIS — M5126 Other intervertebral disc displacement, lumbar region: Secondary | ICD-10-CM

## 2017-04-15 DIAGNOSIS — M545 Low back pain: Secondary | ICD-10-CM

## 2017-04-15 DIAGNOSIS — M47896 Other spondylosis, lumbar region: Secondary | ICD-10-CM | POA: Insufficient documentation

## 2017-04-15 DIAGNOSIS — G8929 Other chronic pain: Secondary | ICD-10-CM | POA: Diagnosis not present

## 2017-04-25 ENCOUNTER — Telehealth: Payer: Self-pay | Admitting: Family Medicine

## 2017-04-25 NOTE — Telephone Encounter (Signed)
Pt. Called requesting his MRI results. Please f/u

## 2017-05-02 ENCOUNTER — Emergency Department (HOSPITAL_COMMUNITY): Payer: Medicaid Other

## 2017-05-02 ENCOUNTER — Encounter (HOSPITAL_COMMUNITY): Payer: Self-pay | Admitting: Emergency Medicine

## 2017-05-02 ENCOUNTER — Emergency Department (HOSPITAL_COMMUNITY)
Admission: EM | Admit: 2017-05-02 | Discharge: 2017-05-02 | Disposition: A | Discharge status: Home / Self Care | Payer: Medicaid Other | Attending: Emergency Medicine | Admitting: Emergency Medicine

## 2017-05-02 DIAGNOSIS — J45909 Unspecified asthma, uncomplicated: Secondary | ICD-10-CM | POA: Insufficient documentation

## 2017-05-02 DIAGNOSIS — Z87891 Personal history of nicotine dependence: Secondary | ICD-10-CM | POA: Diagnosis not present

## 2017-05-02 DIAGNOSIS — Z7982 Long term (current) use of aspirin: Secondary | ICD-10-CM | POA: Insufficient documentation

## 2017-05-02 DIAGNOSIS — Z7902 Long term (current) use of antithrombotics/antiplatelets: Secondary | ICD-10-CM | POA: Insufficient documentation

## 2017-05-02 DIAGNOSIS — B20 Human immunodeficiency virus [HIV] disease: Secondary | ICD-10-CM

## 2017-05-02 DIAGNOSIS — R079 Chest pain, unspecified: Secondary | ICD-10-CM | POA: Diagnosis present

## 2017-05-02 DIAGNOSIS — Z85828 Personal history of other malignant neoplasm of skin: Secondary | ICD-10-CM | POA: Insufficient documentation

## 2017-05-02 DIAGNOSIS — R002 Palpitations: Secondary | ICD-10-CM | POA: Diagnosis not present

## 2017-05-02 DIAGNOSIS — I2510 Atherosclerotic heart disease of native coronary artery without angina pectoris: Secondary | ICD-10-CM | POA: Insufficient documentation

## 2017-05-02 DIAGNOSIS — I119 Hypertensive heart disease without heart failure: Secondary | ICD-10-CM | POA: Insufficient documentation

## 2017-05-02 DIAGNOSIS — Z79899 Other long term (current) drug therapy: Secondary | ICD-10-CM | POA: Insufficient documentation

## 2017-05-02 LAB — CBC
HEMATOCRIT: 42.3 % (ref 39.0–52.0)
Hemoglobin: 13.9 g/dL (ref 13.0–17.0)
MCH: 25.8 pg — ABNORMAL LOW (ref 26.0–34.0)
MCHC: 32.9 g/dL (ref 30.0–36.0)
MCV: 78.6 fL (ref 78.0–100.0)
Platelets: 168 10*3/uL (ref 150–400)
RBC: 5.38 MIL/uL (ref 4.22–5.81)
RDW: 14.9 % (ref 11.5–15.5)
WBC: 5.9 10*3/uL (ref 4.0–10.5)

## 2017-05-02 LAB — BASIC METABOLIC PANEL
ANION GAP: 10 (ref 5–15)
BUN: 12 mg/dL (ref 6–20)
CHLORIDE: 101 mmol/L (ref 101–111)
CO2: 24 mmol/L (ref 22–32)
Calcium: 9.3 mg/dL (ref 8.9–10.3)
Creatinine, Ser: 1.22 mg/dL (ref 0.61–1.24)
Glucose, Bld: 143 mg/dL — ABNORMAL HIGH (ref 65–99)
POTASSIUM: 4 mmol/L (ref 3.5–5.1)
SODIUM: 135 mmol/L (ref 135–145)

## 2017-05-02 LAB — I-STAT TROPONIN, ED
TROPONIN I, POC: 0.01 ng/mL (ref 0.00–0.08)
Troponin i, poc: 0 ng/mL (ref 0.00–0.08)

## 2017-05-02 MED ORDER — ALUM & MAG HYDROXIDE-SIMETH 400-400-40 MG/5ML PO SUSP: 5.0000 mL | Freq: Four times a day (QID) | ORAL | 0 refills | Status: DC | PRN

## 2017-05-02 MED ORDER — GI COCKTAIL ~~LOC~~
30.0000 mL | Freq: Once | ORAL | Status: AC
  Administered 2017-05-02: 30 mL via ORAL
  Filled 2017-05-02: qty 30

## 2017-05-02 MED ORDER — FAMOTIDINE 20 MG PO TABS: 20.0000 mg | ORAL_TABLET | Freq: Two times a day (BID) | ORAL | 0 refills | Status: DC

## 2017-05-02 NOTE — ED Notes (Signed)
Pt states he understtands instructions. Home stable with family.

## 2017-05-02 NOTE — ED Triage Notes (Signed)
Patient reports intermittent central/left chest pain for 3 days with mild SOB and diaphoresis , denies nausea or emesis , history of CAD /coronary stent - his cardiologist is Dr. Debara Pickett.

## 2017-05-02 NOTE — Consult Note (Signed)
CARDIOLOGY CONSULT NOTE   Patient ID: Benjamin Herrera MRN: [id number] DOB/AGE: June 10, 1966 51 y.o.  Admit date: 05/02/2017  Primary Physician   Boykin Nearing, MD Primary Cardiologist   Dr Debara Pickett 02/24/2017 Reason for Consultation   Chest pain Requesting MD: Lenna Sciara Ward, PAC  HPI: Benjamin Herrera is a 51 y.o. male with hx of RCA 100% w/ collaterals and 90% OM1 rx w/ Resolute Onyx 3 0 x 15 mm DES 09/2016, HTN, dyslipidemia, HIV, GERD, polysubstance abuse (last use THC a week ago, minimal use).    02/24/2017 office visit, pt doing well and BP controlled.  Pt is being seen today for the evaluation of Chest pain at the request of J Ward, PAC.  Pt c/o upper abd and lower chest pain x 3 days. Since his stent, he has had prickly feelings in his chest and near his clavicle, but not exertional and resolves spontaneously. These pains are different.  The mid-epigastric, lower sternal pain is burning. He has never had burning pain like this before. Episodes last 3-5 minutes, did not try any rx. Intensity 5/10, no SOB, no N&V, had diaphoresis last pm only.    He has also been having L chest pain at the L sternal border. It is stinging, better if he tilts up on his L side. Episodes have resolved spontaneously, but he took metoprolol last pm that also helped. Either pain can occur separately or together. He has been belching more than usual.   Stinging pain better w/ certain positions, burning pain better w/ GI cocktail. Neither pain is exertional.   He has not been exercising due to arthritis in his hips and lower back.  He has not missed any doses of Plavix. He is compliant w/ all rx. He has not done cocaine since before his last stent, took a couple of puffs of THC last week, that gave him CP, doesn't think he will do that again.  He occasionally gets abd pain below the umbilicus, had that last week.  He has had 2 episodes of rapid HR that starts and stops spontaneously. He takes deep breaths  to help it go away. The last episode was over a week ago.    Past Medical History:  Diagnosis Date  . Achalasia and cardiospasm   . Allergy   . Arthritis    back ,shoulders   . Asthma   . Boils    under arms hx of  . Candidiasis of mouth   . Candidiasis of unspecified site   . Cholelithiasis   . Cocaine abuse    Quit 09/2016  . Condyloma acuminatum in male of scrotum & anal canal s/p laser ablation 07/03/2012  . Coronary artery disease   . Dysphagia   . Fracture, humerus   . GERD (gastroesophageal reflux disease)   . Heart attack ([place])   . Hemorrhoids   . Hepatitis 1993   A  . Herpes labialis   . HIV (human immunodeficiency virus infection) ([place]) [place]  . Hypertension Dx 2014   off all bp meds for last 9 or 10 months  . Pilonidal disease 01/10/2012  . Polysubstance abuse   . Psoriasis    hx of  . Sleep apnea    does  have CPAP  . 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 BALLOON DILATION N/A 11/11/2015   Procedure: BALLOON DILATION;  Surgeon: Mauri Pole, MD;  Location: [facility];  Service: Endoscopy;  Laterality: N/A;  . BOTOX INJECTION  10/11/2012   Procedure: BOTOX INJECTION;  Surgeon: Inda Castle, MD;  Location: [facility];  Service: Endoscopy;  Laterality: N/A;  . BOTOX INJECTION N/A 01/25/2013   Procedure: BOTOX INJECTION;  Surgeon: Inda Castle, MD;  Location: [facility];  Service: Endoscopy;  Laterality: N/A;  . BOTOX INJECTION N/A 06/21/2013   Procedure: BOTOX INJECTION;  Surgeon: Inda Castle, MD;  Location: [facility];  Service: Endoscopy;  Laterality: N/A;  . BOTOX INJECTION N/A 10/08/2013   Procedure: BOTOX INJECTION;  Surgeon: Inda Castle, MD;  Location: [facility];  Service: Endoscopy;  Laterality: N/A;  . BOTOX INJECTION N/A 06/16/2015   Procedure:  BOTOX INJECTION;  Surgeon: Inda Castle, MD;  Location: [facility];  Service: Endoscopy;  Laterality: N/A;  . CARDIAC CATHETERIZATION N/A 10/11/2016   Procedure: Left Heart Cath and Coronary Angiography;  Surgeon: Burnell Blanks, MD;  mRCA 100% w/ L>R collaterals, OM1 90%  . CARDIAC CATHETERIZATION N/A 10/11/2016   Procedure: Coronary Stent Intervention;  Surgeon: Burnell Blanks, MD;  Hinda Lenis 7.1I96 DES OM1  . COLONOSCOPY  05/19/2012   Procedure: COLONOSCOPY;  Surgeon: Inda Castle, MD;  Location: [facility];  Service: Endoscopy;  Laterality: N/A;  jill trying to contact pt to come in 0830 for 930 case, phone not accepting messages  . COLONOSCOPY    . ESOPHAGEAL MANOMETRY  05/29/2012   Procedure: ESOPHAGEAL MANOMETRY (EM);  Surgeon: Inda Castle, MD;  Location: [facility];  Service: Endoscopy;  Laterality: N/A;  . ESOPHAGEAL MANOMETRY N/A 10/06/2015   Procedure: ESOPHAGEAL MANOMETRY (EM);  Surgeon: Mauri Pole, MD;  Location: [facility];  Service: Endoscopy;  Laterality: N/A;  . ESOPHAGOGASTRODUODENOSCOPY  11/11/2011   Procedure: ESOPHAGOGASTRODUODENOSCOPY (EGD);  Surgeon: Inda Castle, MD;  Location: [facility];  Service: Endoscopy;  Laterality: N/A;  botox injection  called Pt to change time of procedure per Dr Deatra Ina  . ESOPHAGOGASTRODUODENOSCOPY  03/10/2012   Procedure: ESOPHAGOGASTRODUODENOSCOPY (EGD);  Surgeon: Inda Castle, MD;  Location: [facility];  Service: Endoscopy;  Laterality: N/A;  . ESOPHAGOGASTRODUODENOSCOPY  10/11/2012   Procedure: ESOPHAGOGASTRODUODENOSCOPY (EGD);  Surgeon: Inda Castle, MD;  Location: [facility];  Service: Endoscopy;  Laterality: N/A;  . ESOPHAGOGASTRODUODENOSCOPY N/A 01/25/2013   Procedure: ESOPHAGOGASTRODUODENOSCOPY (EGD);  Surgeon: Inda Castle, MD;  Location: [facility];  Service: Endoscopy;  Laterality: N/A;  . ESOPHAGOGASTRODUODENOSCOPY N/A 06/21/2013   Procedure: ESOPHAGOGASTRODUODENOSCOPY  (EGD);  Surgeon: Inda Castle, MD;  Location: [facility];  Service: Endoscopy;  Laterality: N/A;  . ESOPHAGOGASTRODUODENOSCOPY (EGD) WITH PROPOFOL N/A 10/08/2013   Procedure: ESOPHAGOGASTRODUODENOSCOPY (EGD) WITH PROPOFOL;  Surgeon: Inda Castle, MD;  [facility];  Service: Endoscopy;  Laterality: N/A;  . ESOPHAGOGASTRODUODENOSCOPY (EGD) WITH PROPOFOL N/A 06/16/2015   Procedure: ESOPHAGOGASTRODUODENOSCOPY (EGD) WITH PROPOFOL;  Surgeon: Inda Castle, MD;  [facility]: [facility];  Service: Endoscopy;  Laterality: N/A;  . ESOPHAGOGASTRODUODENOSCOPY (EGD) WITH PROPOFOL N/A 11/11/2015   Procedure: ESOPHAGOGASTRODUODENOSCOPY (EGD) WITH PROPOFOL;  Surgeon: Mauri Pole, MD;  Location: [facility];  Service: Endoscopy;  Laterality: N/A;  . EXAMINATION UNDER ANESTHESIA  07/24/2012   Procedure: EXAM UNDER ANESTHESIA;  Surgeon: Adin Hector, MD;  Location: [facility];  Service: General;  Laterality: N/A;  . humeral fracture surgery Right yrs ago  . PILONIDAL CYST  EXCISION  07/24/2012   Procedure: CYST EXCISION PILONIDAL SIMPLE;  Surgeon: Adin Hector, MD;  Location: [facility];  Service: General;  Laterality: N/A;  Exam Under Anesthesia,, Excision Pilonidal Disease,   . right shoulder replacement  2008   x 2  . UPPER GASTROINTESTINAL ENDOSCOPY    . WART FULGURATION  07/24/2012   Procedure: FULGURATION ANAL WART;  Surgeon: Adin Hector, MD;  Location: [facility];  Service: General;  Laterality: N/A;  excision of raphe mass    Allergies  Allergen Reactions  . Citrus Swelling    Mouth itching and swelling     I have reviewed the patient's current medications Medication Sig  amLODipine (NORVASC) 2.5 MG tablet Take 1 tablet (2.5 mg total) by mouth daily.  aspirin 81 MG chewable tablet Chew 1 tablet (81 mg total) by mouth daily.  clopidogrel (PLAVIX) 75 MG tablet Take 1 tablet (75 mg total) by mouth daily with breakfast.  GENVOYA 150-150-200-10 MG TABS tablet TAKE 1 TABLET BY MOUTH  DAILY WITH BREAKFAST.  metoprolol succinate (TOPROL XL) 25 MG 24 hr tablet Take 0.5 tablets (12.5 mg total) by mouth daily.  Naphazoline-Pheniramine (OPCON-A OP) Apply 1-2 drops to eye 2 (two) times daily as needed (dry eyes.).   pantoprazole (PROTONIX) 40 MG tablet Take 1 tablet (40 mg total) by mouth daily.  PREZISTA 800 MG tablet TAKE 1 TABLET (800 MG TOTAL) BY MOUTH DAILY WITH BREAKFAST.  rosuvastatin (CRESTOR) 20 MG tablet Take 1 tablet (20 mg total) by mouth daily.  VOLTAREN 1 % GEL Apply 4 g topically 4 (four) times daily.  acetaminophen (TYLENOL 8 HOUR) 650 MG CR tablet Take 1 tablet (650 mg total) by mouth every 8 (eight) hours.  nitroGLYCERIN (NITROSTAT) 0.4 MG SL tablet Place 1 tablet (0.4 mg total) under the tongue every 5 (five) minutes as needed for chest pain.     Social History   Social History  . Marital status: Single    Spouse name: N/A  . Number of children: N/A  . Years of education: N/A   Occupational History  . Disabled    Social History Main Topics  . Smoking status: Former Smoker    Packs/day: 0.50    Years: 25.00    Types: Cigarettes    Quit date: 11/26/2015  . Smokeless tobacco: Never Used  . Alcohol use No  . Drug use: Yes    Frequency: 3.0 times per week    Types: Marijuana, Cocaine  . Sexual activity: Not Currently    Partners: Male     Comment: pt. given condoms, used marijuana -3 days ago 05/14/16   Other Topics Concern  . Not on file   Social History Narrative   Pt lives in a ground floor apartment in [place].    Family Status  Relation Status  . Mother Alive  . Father Alive  . MGM Deceased  . PGM Deceased  . MGF Deceased  . PGF Deceased  . Neg Hx (Not Specified)   Family History  Problem Relation Age of Onset  . Arthritis Mother   . Hypertension Mother   . Heart disease Mother        MI age 53  . Diabetes Maternal Grandmother   . Stomach cancer Paternal Grandmother   . Colon cancer Neg Hx   . Colon polyps Neg Hx   . Esophageal  cancer Neg Hx   . Rectal cancer Neg Hx      ROS:  Full 14 point review  of systems complete and found to be negative unless listed above.  Physical Exam: Blood pressure (!) 143/90, pulse 67, temperature 98.1 F (36.7 C), temperature source Oral, resp. rate 14, height 6\' 1"  (1.854 m), weight 248 lb (112.5 kg), SpO2 96 %.  General: Well developed, well nourished, male in no acute distress Head: Eyes PERRLA, No xanthomas.   Normocephalic and atraumatic, oropharynx without edema or exudate. Dentition: fair Lungs: clear bilaterally; + chest wall tenderness upper 3rd L sternal border Heart: HRRR S1 S2, no rub/gallop, no murmur. pulses are 2+ all 4 extrem.   Neck: No carotid bruits. No lymphadenopathy.  JVD not elevated Abdomen: Bowel sounds present, abdomen soft and non-tender without masses or hernias noted. Msk:  No spine or cva tenderness. No weakness, no joint deformities or effusions. Extremities: No clubbing or cyanosis. No edema.  Neuro: Alert and oriented X 3. No focal deficits noted. Psych:  Good affect, responds appropriately Skin: No rashes or lesions noted.  Labs:   Lab Results  Component Value Date   WBC 5.9 05/02/2017   HGB 13.9 05/02/2017   HCT 42.3 05/02/2017   MCV 78.6 05/02/2017   PLT 168 05/02/2017     Recent Labs Lab 05/02/17 0754  NA 135  K 4.0  CL 101  CO2 24  BUN 12  CREATININE 1.22  CALCIUM 9.3  GLUCOSE 143*   No results for input(s): CKTOTAL, CKMB, TROPONINI in the last 72 hours.  Recent Labs  05/02/17 0810  TROPIPOC 0.00   Lab Results  Component Value Date   CHOL 144 01/26/2017   HDL 33 (L) 01/26/2017   LDLCALC 90 01/26/2017   TRIG 104 01/26/2017    Echo: 10/09/2016 - Left ventricle: The cavity size was normal. Systolic function was   normal. The estimated ejection fraction was in the range of 60%   to 65%. Wall motion was normal; there were no regional wall   motion abnormalities. Left ventricular diastolic function   parameters were  normal. - Atrial septum: No defect or patent foramen ovale was identified.  ECG:  05/02/2017 SR, III and AVR have slightly different morphology and T waves, no clear clinical significance. Otherwise, no change from   Cath: 10/11/2016  Prox RCA lesion, 20 %stenosed.  Prox RCA to Mid RCA lesion, 99 %stenosed.  Mid RCA to Dist RCA lesion, 100 %stenosed.  1st Mrg-2 lesion, 60 %stenosed.  1st Mrg-1 lesion, 90 %stenosed.  Post intervention, there is a 0% residual stenosis.  A STENT RESOLUTE ONYX 3.5X15 drug eluting stent was successfully placed.  Ost LAD to Mid LAD lesion, 20 %stenosed.  1. Severe double vessel CAD 2. Unstable angina 3. Severe stenosis first obtuse marginal branch 4. Successful PTCA/DES x 1 OM1 5. Chronic total occlusion mid RCA. The distal vessel fills from left to right collaterals. Recommendations: Will plan one year of DAPT with ASA and Plavix. Would start a statin. Likely discharge tomorrow. Follow up with Dr. Debara Pickett.   Radiology:  Dg Chest 2 View Result Date: 05/02/2017 CLINICAL DATA:  Chest pain for 3 days EXAM: CHEST  2 VIEW COMPARISON:  10/08/2016 FINDINGS: The cardiac silhouette, mediastinal and hilar contours are within normal limits and stable. Mild tortuosity of the thoracic aorta. The lungs are clear. No pleural effusion. The bony thorax is intact. Stable right humeral head prosthesis. IMPRESSION: No acute cardiopulmonary findings. Electronically Signed   By: Marijo Sanes M.D.   On: 05/02/2017 08:20    ASSESSMENT AND PLAN:   The patient  was seen today by Dr Marlou Porch, the patient evaluated and the data reviewed.   Principal Problem:   Chest pain with low risk for cardiac etiology - initial ez negative and ECG not acute.  - Pt has chest wall tenderness, reproduces the stinging sensation - GI cocktail relieved the burning - hx GERD, on Protonix. F/u w/ Dr Donalda Ewings to take Mylanta for the pain. - if repeat troponin negative, do not think admission  needed from a cardiac standpoint.  - he would be at risk to have angina from the 100% RCA but the sx are atypical and I do not believe this is happening.  Active Problems:   Heart palpitations - f/u in the office, may need event monitor   Signed: Lenoard Aden 05/02/2017 11:46 AM Beeper [phone]  Personally seen and examined. Agree with above.  51 year old male being seen at the request of Dr. Roderic Palau for the evaluation of chest pain. He has known CAD with recent obtuse marginal stent in December. He has had ongoing issues with GERD and has had multiple upper endoscopies. He comes in today with a few different types of chest discomfort, some of which is changed with position, some burning relieved with GI cocktail, however he was concerned because of his recent stent placement. ECG shows nonspecific ST-T wave changes, no acute ischemic changes noted. Personally viewed.  Physical exam demonstrates overweight male, positive bowel sounds, nontender epigastric region, regular rate and rhythm, alert, no significant edema.  Atypical chest pain  - Noncardiac. Troponin is normal. Second troponin pending. As long as this is reassuring, he may go home. We discussed other etiologies of chest pain including GI discomfort or musculoskeletal. He does note that he has been eating a lot of hot dogs recently and that this may have been exacerbating his GERD. Continue to treat GERD through dietary modification as well as direction through GI.  HIV  - Per primary team  GERD  - Has seen gastroenterology and has had multiple EGDs in the past.  - Mylanta. Protonix.  Candee Furbish, MD

## 2017-05-02 NOTE — Discharge Instructions (Signed)
It was my pleasure taking care of you today!   You were seen in the Emergency Department today for chest pain.  As we have discussed, today?s blood work and imaging are normal, but you should still follow up with both the cardiology clinic and your GI doctor for further discussion of today's visit.   Take Pepcid twice daily. The other liquid medication is to take as needed.   Return to the Emergency Department if you experience any further chest pain/pressure/tightness, difficulty breathing, sudden sweating, or other symptoms that concern you.

## 2017-05-02 NOTE — ED Provider Notes (Signed)
[facility] DEPT Provider Note   CSN: [id number] Arrival date & time: 05/02/17  0740     History   Chief Complaint Chief Complaint  Patient presents with  . Chest Pain    HPI SIDNEY KANN is a 51 y.o. male.  The history is provided by the patient and medical records. No language interpreter was used.  Chest Pain     KEYSHAWN HELLWIG is a 51 y.o. male  with a PMH of CAD with stent placement in December 2017, HIV, asthma, achalasia, HTN, HLD who presents to the Emergency Department complaining of intermittent chest pain described as a burning sensation 3 days. Patient states that he also has a sharp left-sided chest pain which feels different than the burning sensation. This also will come and go. Patient states his pain gets worse after meals. He also states that when he sits down, after walking around for a while or doing chores, his chest pain will return. He has not noticed any alleviating factors. He has tried no medications. No abdominal pain, nausea, vomiting, shortness of breath, diaphoresis. He does endorse occasional belching which he states occurred with his previous heart attack as well, however the pain today feels different than with prior heart attack.  Past Medical History:  Diagnosis Date  . Achalasia and cardiospasm   . Allergy   . Arthritis    back ,shoulders   . Asthma   . Boils    under arms hx of  . Candidiasis of mouth   . Candidiasis of unspecified site   . Cholelithiasis   . Cocaine abuse    Quit 09/2016  . Condyloma acuminatum in male of scrotum & anal canal s/p laser ablation 07/03/2012  . Coronary artery disease   . Dysphagia   . Fracture, humerus   . GERD (gastroesophageal reflux disease)   . Heart attack ([place])   . Hemorrhoids   . Hepatitis 1993   A  . Herpes labialis   . HIV (human immunodeficiency virus infection) ([place]) [place]  . Hypertension Dx 2014   off all bp meds for last 9 or 10 months  . Pilonidal disease 01/10/2012  .  Polysubstance abuse   . Psoriasis    hx of  . Sleep apnea    does  have CPAP  . 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 Heart palpitations 05/02/2017  . Mixed hyperlipidemia 01/24/2017  . Anxiety and depression 11/30/2016  . CAD in native artery 10/28/2016  . Primary insomnia 10/28/2016  . Bilateral wrist pain 10/28/2016  . Polysubstance abuse   . Unstable angina ([place])   . Chest pain with low risk for cardiac etiology 10/08/2016  . Dysphagia   . GERD (gastroesophageal reflux disease) 01/10/2015  . Chronic low back pain 10/07/2014  . Syncope and collapse 10/07/2014  . Mild sleep apnea 10/07/2014  . 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  . Numbness and tingling in left hand 02/12/2013  . Myalgia 02/12/2013  . Internal hemorrhoids with bleeding 01/30/2013  . 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 Condyloma acuminatum in male of scrotum & anal canal s/p laser ablation 07/03/2012  . Essential hypertension 08/31/2010  . PSORIASIS 11/20/2009  . OTHER CANDIDIASIS OF OTHER SPECIFIED SITES 06/26/2009  .  Marijuana use 05/16/2008  . ASTHMA, UNSPECIFIED, UNSPECIFIED STATUS 05/16/2008  . HIV (human immunodeficiency virus infection) ([address]) 09/03/2006  . Closed fracture of part of humerus 09/03/2006    Past Surgical History:  Procedure Laterality Date  . BALLOON DILATION N/A 11/11/2015   Procedure: BALLOON DILATION;  Surgeon: Mauri Pole, MD;  Location: [facility];  Service: Endoscopy;  Laterality: N/A;  . BOTOX INJECTION  10/11/2012   Procedure: BOTOX INJECTION;  Surgeon: Inda Castle, MD;  Location: [facility];  Service: Endoscopy;  Laterality: N/A;  . BOTOX INJECTION N/A 01/25/2013   Procedure: BOTOX INJECTION;  Surgeon: Inda Castle, MD;  [facility];  Service: Endoscopy;  Laterality: N/A;  . BOTOX INJECTION N/A 06/21/2013   Procedure: BOTOX INJECTION;  Surgeon: Inda Castle, MD;  [facility]: [facility];  Service: Endoscopy;  Laterality: N/A;  . BOTOX INJECTION N/A 10/08/2013   Procedure: BOTOX INJECTION;  Surgeon: Inda Castle, MD;  [facility];  Service: Endoscopy;  Laterality: N/A;  . BOTOX INJECTION N/A 06/16/2015   Procedure: BOTOX INJECTION;  Surgeon: Inda Castle, MD;  [facility]: [facility];  Service: Endoscopy;  Laterality: N/A;  . CARDIAC CATHETERIZATION N/A 10/11/2016   Procedure: Left Heart Cath and Coronary Angiography;  Surgeon: Burnell Blanks, MD;  mRCA 100% w/ L>R collaterals, OM1 90%  . CARDIAC CATHETERIZATION N/A 10/11/2016   Procedure: Coronary Stent Intervention;  Surgeon: Burnell Blanks, MD;  Hinda Lenis 1.5B26 DES OM1  . COLONOSCOPY  05/19/2012   Procedure: COLONOSCOPY;  Surgeon: Inda Castle, MD;  [facility];  Service: Endoscopy;  Laterality: N/A;  jill trying to contact pt to come in 0830 for 930 case, phone not accepting messages  . COLONOSCOPY    . ESOPHAGEAL MANOMETRY  05/29/2012   Procedure: ESOPHAGEAL MANOMETRY (EM);  Surgeon: Inda Castle, MD;  [facility];  Service: Endoscopy;  Laterality: N/A;  . ESOPHAGEAL MANOMETRY N/A 10/06/2015   Procedure: ESOPHAGEAL MANOMETRY (EM);  Surgeon: Mauri Pole, MD;  [facility];  Service: Endoscopy;  Laterality: N/A;  . ESOPHAGOGASTRODUODENOSCOPY  11/11/2011   Procedure: ESOPHAGOGASTRODUODENOSCOPY (EGD);  Surgeon: Inda Castle, MD;  Location: [facility];  Service: Endoscopy;  Laterality: N/A;  botox injection  called Pt to change time of procedure per Dr Deatra Ina  . ESOPHAGOGASTRODUODENOSCOPY  03/10/2012   Procedure: ESOPHAGOGASTRODUODENOSCOPY (EGD);  Surgeon: Inda Castle, MD;  Location: [facility];  Service: Endoscopy;  Laterality: N/A;  .  ESOPHAGOGASTRODUODENOSCOPY  10/11/2012   Procedure: ESOPHAGOGASTRODUODENOSCOPY (EGD);  Surgeon: Inda Castle, MD;  Location: [facility];  Service: Endoscopy;  Laterality: N/A;  . ESOPHAGOGASTRODUODENOSCOPY N/A 01/25/2013   Procedure: ESOPHAGOGASTRODUODENOSCOPY (EGD);  Surgeon: Inda Castle, MD;  Location: [facility];  Service: Endoscopy;  Laterality: N/A;  . ESOPHAGOGASTRODUODENOSCOPY N/A 06/21/2013   Procedure: ESOPHAGOGASTRODUODENOSCOPY (EGD);  Surgeon: Inda Castle, MD;  Location: [facility];  Service: Endoscopy;  Laterality: N/A;  . ESOPHAGOGASTRODUODENOSCOPY (EGD) WITH PROPOFOL N/A 10/08/2013   Procedure: ESOPHAGOGASTRODUODENOSCOPY (EGD) WITH PROPOFOL;  Surgeon: Inda Castle, MD;  [facility];  Service: Endoscopy;  Laterality: N/A;  . ESOPHAGOGASTRODUODENOSCOPY (EGD) WITH PROPOFOL N/A 06/16/2015   Procedure: ESOPHAGOGASTRODUODENOSCOPY (EGD) WITH PROPOFOL;  Surgeon: Inda Castle, MD;  [facility];  Service: Endoscopy;  Laterality: N/A;  . ESOPHAGOGASTRODUODENOSCOPY (EGD) WITH PROPOFOL N/A 11/11/2015   Procedure: ESOPHAGOGASTRODUODENOSCOPY (EGD) WITH PROPOFOL;  Surgeon: Mauri Pole, MD;  [facility];  Service: Endoscopy;  Laterality: N/A;  . EXAMINATION UNDER ANESTHESIA  07/24/2012   Procedure: EXAM UNDER ANESTHESIA;  Surgeon: Adin Hector, MD;  Location: [facility];  Service: General;  Laterality: N/A;  . humeral fracture surgery Right yrs ago  . PILONIDAL CYST EXCISION  07/24/2012   Procedure: CYST EXCISION PILONIDAL SIMPLE;  Surgeon: Adin Hector, MD;  [facility]: [facility];  Service: General;  Laterality: N/A;  Exam Under Anesthesia,, Excision Pilonidal Disease,   . right shoulder replacement  2008   x 2  . UPPER GASTROINTESTINAL ENDOSCOPY    . WART FULGURATION  07/24/2012   Procedure: FULGURATION ANAL WART;  Surgeon: Adin Hector, MD;  Location: [facility];  Service: General;  Laterality: N/A;  excision of raphe mass       Home  Medications    Prior to Admission medications   Medication Sig Start Date End Date Taking? Authorizing Provider  amLODipine (NORVASC) 2.5 MG tablet Take 1 tablet (2.5 mg total) by mouth daily. 01/21/17  Yes Hilty, Nadean Corwin, MD  aspirin 81 MG chewable tablet Chew 1 tablet (81 mg total) by mouth daily. 10/28/16  Yes Funches, Adriana Mccallum, MD  clopidogrel (PLAVIX) 75 MG tablet Take 1 tablet (75 mg total) by mouth daily with breakfast. 10/21/16  Yes Barrett, Rhonda G, PA-C  GENVOYA 150-150-200-10 MG TABS tablet TAKE 1 TABLET BY MOUTH DAILY WITH BREAKFAST. 11/09/16  Yes Campbell Riches, MD  metoprolol succinate (TOPROL XL) 25 MG 24 hr tablet Take 0.5 tablets (12.5 mg total) by mouth daily. 01/21/17  Yes Hilty, Nadean Corwin, MD  Naphazoline-Pheniramine (OPCON-A OP) Apply 1-2 drops to eye 2 (two) times daily as needed (dry eyes.).    Yes [provider]  pantoprazole (PROTONIX) 40 MG tablet Take 1 tablet (40 mg total) by mouth daily. 10/21/16  Yes Barrett, Rhonda G, PA-C  PREZISTA 800 MG tablet TAKE 1 TABLET (800 MG TOTAL) BY MOUTH DAILY WITH BREAKFAST. 11/09/16  Yes Campbell Riches, MD  rosuvastatin (CRESTOR) 20 MG tablet Take 1 tablet (20 mg total) by mouth daily. 10/21/16 05/02/17 Yes Barrett, Evelene Croon, PA-C  VOLTAREN 1 % GEL Apply 4 g topically 4 (four) times daily. 11/01/16  Yes Funches, Josalyn, MD  acetaminophen (TYLENOL 8 HOUR) 650 MG CR tablet Take 1 tablet (650 mg total) by mouth every 8 (eight) hours. 11/30/16   Funches, Adriana Mccallum, MD  alum & mag hydroxide-simeth (MAALOX PLUS) 400-400-40 MG/5ML suspension Take 5 mLs by mouth every 6 (six) hours as needed for indigestion. 05/02/17   Natassia Guthridge, Ozella Almond, PA-C  famotidine (PEPCID) 20 MG tablet Take 1 tablet (20 mg total) by mouth 2 (two) times daily. 05/02/17   Cherise Fedder, Ozella Almond, PA-C  nitroGLYCERIN (NITROSTAT) 0.4 MG SL tablet Place 1 tablet (0.4 mg total) under the tongue every 5 (five) minutes as needed for chest pain. 10/21/16 05/02/17  Barrett, Evelene Croon, PA-C    Family History Family History  Problem Relation Age of Onset  . Arthritis Mother   . Hypertension Mother   . Heart disease Mother        MI age 65  . Diabetes Maternal Grandmother   . Stomach cancer Paternal Grandmother   . Colon cancer Neg Hx   . Colon polyps Neg Hx   . Esophageal cancer Neg Hx   . Rectal cancer Neg Hx     Social History Social History  Substance Use Topics  . Smoking status: Former Smoker    Packs/day: 0.50    Years: 25.00    Types: Cigarettes  Quit date: 11/26/2015  . Smokeless tobacco: Never Used  . Alcohol use No     Allergies   Citrus   Review of Systems Review of Systems  Cardiovascular: Positive for chest pain.  All other systems reviewed and are negative.    Physical Exam Updated Vital Signs BP (!) 143/79   Pulse 71   Temp 98.1 F (36.7 C) (Oral)   Resp 18   Ht 6\' 1"  (1.854 m)   Wt 112.5 kg (248 lb)   SpO2 95%   BMI 32.72 kg/m   Physical Exam  Constitutional: He is oriented to person, place, and time. He appears well-developed and well-nourished. No distress.  HENT:  Head: Normocephalic and atraumatic.  Neck: Neck supple. No JVD present.  Cardiovascular: Normal rate, regular rhythm and normal heart sounds.   No murmur heard. Pulmonary/Chest: Effort normal and breath sounds normal. No respiratory distress. He has no wheezes. He has no rales. He exhibits tenderness.  Abdominal: Soft. Bowel sounds are normal. He exhibits no distension. There is no tenderness.  Musculoskeletal: He exhibits no edema.  Neurological: He is alert and oriented to person, place, and time.  Skin: Skin is warm and dry.  Nursing note and vitals reviewed.    ED Treatments / Results  Labs (all labs ordered are listed, but only abnormal results are displayed) Labs Reviewed  BASIC METABOLIC PANEL - Abnormal; Notable for the following:       Result Value   Glucose, Bld 143 (*)    All other components within normal limits  CBC -  Abnormal; Notable for the following:    MCH 25.8 (*)    All other components within normal limits  I-STAT TROPOININ, ED  I-STAT TROPOININ, ED    EKG  EKG Interpretation  Date/Time:  Monday May 02 2017 07:43:32 EDT Ventricular Rate:  74 PR Interval:  162 QRS Duration: 92 QT Interval:  368 QTC Calculation: 408 R Axis:   99 Text Interpretation:  Normal sinus rhythm Rightward axis Possible Anterior infarct , age undetermined Abnormal ECG Confirmed by Milton Ferguson [phone]) on 05/02/2017 9:10:45 AM       Radiology Dg Chest 2 View  Result Date: 05/02/2017 CLINICAL DATA:  Chest pain for 3 days EXAM: CHEST  2 VIEW COMPARISON:  10/08/2016 FINDINGS: The cardiac silhouette, mediastinal and hilar contours are within normal limits and stable. Mild tortuosity of the thoracic aorta. The lungs are clear. No pleural effusion. The bony thorax is intact. Stable right humeral head prosthesis. IMPRESSION: No acute cardiopulmonary findings. Electronically Signed   By: Marijo Sanes M.D.   On: 05/02/2017 08:20    Procedures Procedures (including critical care time)  Medications Ordered in ED Medications  gi cocktail (Maalox,Lidocaine,Donnatal) (30 mLs Oral Given 05/02/17 1011)     Initial Impression / Assessment and Plan / ED Course  I have reviewed the triage vital signs and the nursing notes.  Pertinent labs & imaging results that were available during my care of the patient were reviewed by me and considered in my medical decision making (see chart for details).    BREANNA MCDANIEL is a 51 y.o. male who presents to ED for intermittent chest pain described as a burning sensation x 3 days. Appears more GI related. On exam, patient is afebrile, hemodynamically stable with no tachycardia. Normal heart and lung sounds. No lower extremity edema or JVD. Chest pain is reproducible. EKG non-ischemic. Troponin negative. CBC, CMP reassuring. CXR negative. Patient had cardiac cath with stent  placement in  December 2017. He states pain today feels different than it did prior to cath. Cardiology was consulted who evaluated patient. I appreciate their assistance with patient care. Please see consultation note for full recommendations. Cardiology police patient is low risk for cardiac etiology. GI cocktail was given prior to their evaluation and has now relieved burning pain. If 2nd troponin is negative, cardiology does not believe admission is warranted from a cardiac standpoint. They would like to see patient in follow-up in the office. Patient reevaluated and feels improved. Will give Rx for Pepcid and Maalox. The patient [facility] bilateral was in the room and scheduled outpatient GI follow-up as well. Reasons to return to the ER were discussed and all questions were answered.  Patient discussed with Dr. Roderic Palau who agrees with treatment plan.    Final Clinical Impressions(s) / ED Diagnoses   Final diagnoses:  Chest pain with low risk of acute coronary syndrome    New Prescriptions New Prescriptions   ALUM & MAG HYDROXIDE-SIMETH (MAALOX PLUS) 400-400-40 MG/5ML SUSPENSION    Take 5 mLs by mouth every 6 (six) hours as needed for indigestion.   FAMOTIDINE (PEPCID) 20 MG TABLET    Take 1 tablet (20 mg total) by mouth 2 (two) times daily.     Ruffin Lada, Ozella Almond, PA-C 05/02/17 1344    Milton Ferguson, MD 05/02/17 1536

## 2017-05-09 NOTE — Telephone Encounter (Signed)
Patient return cma call   Patient verify dOB  Patient was aware and understood

## 2017-05-12 ENCOUNTER — Telehealth: Payer: Self-pay | Admitting: Gastroenterology

## 2017-05-12 NOTE — Telephone Encounter (Signed)
The pt was seen in the ED with burning in his chest.  It was determined not to be cardiac related.  He has a history of heart attack.  Pt is taking protonix 1 time daily and pepcid at bedtime.  He was advised to take protonix twice daily and pepcid at bedtime and an appt has been scheduled with Dr Silverio Decamp 07/19/17.  Dr Silverio Decamp any further recommendations?

## 2017-05-12 NOTE — Telephone Encounter (Signed)
Agree with increasing PPI to bid and pepcid at bedtime. Follow up in office visit. Thanks

## 2017-05-13 NOTE — Telephone Encounter (Signed)
The pt advised and he will keep appt as scheduled.

## 2017-05-24 NOTE — Progress Notes (Signed)
Cardiology Office Note    Date:  05/25/2017   ID:  [name], DOB [birth year], MRN [id number]  PCP:  Boykin Nearing, MD  Cardiologist: Dr. Debara Pickett   Chief Complaint  Patient presents with  . Follow-up    recent Emergency Dept visit for chest pain    History of Present Illness:    Benjamin Herrera is a 51 y.o. male with past medical history of CAD (cath in 09/2016 showing 100% RCA occlusion with collaterals and 90% OM1 (treated with DES)), HTN, HLD, HIV, GERD and substance use who presents to the office today for hospital follow-up.  He recently presented to [facility] ED on 05/02/2017 for evaluation of chest pain for the past 3 days which was noted to be worse after consuming meals. Also improved if he leaned to his left side. While in the ED, his symptoms were relieved with a GI cocktail and his chest wall was tender to palpation. Initial and delta troponin values were negative with EKG showing no acute ischemic changes. He was examined by Cardiology and overall his symptoms were thought to be secondary to GERD and therefore admission was not recommended at that time.   In talking with the patient today, he reports mild episodes of chest pain since his recent ED visit but says it has not been as intense as that day. He reports mild shooting pains along his left pectoral region which mostly occur at rest. Symptoms sometimes exacerbated by turning from side-to-side. Says this can last for seconds up to 5 minutes. No associated dyspnea, diaphoresis, nausea, or vomiting. Reports symptoms have improved since increasing Protonix from 40 mg daily to 40 mg BID. He denies any recent orthopnea, PND, or lower extremity edema.  He reports good compliance with his medications including ASA and Plavix. He denies missing any recent doses. No recent tobacco use or substance use per his report.   Past Medical History:  Diagnosis Date  . Achalasia and cardiospasm   . Allergy   . Arthritis    back  ,shoulders   . Asthma   . Boils    under arms hx of  . CAD (coronary artery disease)    a. 09/2016: cath showing 100% RCA occlusion with collaterals and 90% OM1 (treated with DES)  . Candidiasis of mouth   . Candidiasis of unspecified site   . Cholelithiasis   . Cocaine abuse    Quit 09/2016  . Condyloma acuminatum in male of scrotum & anal canal s/p laser ablation 07/03/2012  . Coronary artery disease   . Dysphagia   . Fracture, humerus   . GERD (gastroesophageal reflux disease)   . Heart attack ([place])   . Hemorrhoids   . Hepatitis 1993   A  . Herpes labialis   . HIV (human immunodeficiency virus infection) ([place]) [place]  . Hypertension Dx 2014   off all bp meds for last 9 or 10 months  . Pilonidal disease 01/10/2012  . Polysubstance abuse   . Psoriasis    hx of  . Sleep apnea    does  have CPAP  . 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 BALLOON DILATION N/A 11/11/2015   Procedure: BALLOON DILATION;  Surgeon: Mauri Pole, MD;  Location: [facility];  Service: Endoscopy;  Laterality: N/A;  .  BOTOX INJECTION  10/11/2012   Procedure: BOTOX INJECTION;  Surgeon: Inda Castle, MD;  [facility];  Service: Endoscopy;  Laterality: N/A;  . BOTOX INJECTION N/A 01/25/2013   Procedure: BOTOX INJECTION;  Surgeon: Inda Castle, MD;  [facility];  Service: Endoscopy;  Laterality: N/A;  . BOTOX INJECTION N/A 06/21/2013   Procedure: BOTOX INJECTION;  Surgeon: Inda Castle, MD;  [facility];  Service: Endoscopy;  Laterality: N/A;  . BOTOX INJECTION N/A 10/08/2013   Procedure: BOTOX INJECTION;  Surgeon: Inda Castle, MD;  [facility];  Service: Endoscopy;  Laterality: N/A;  . BOTOX INJECTION N/A 06/16/2015   Procedure: BOTOX INJECTION;  Surgeon: Inda Castle, MD;  [facility];  Service: Endoscopy;  Laterality: N/A;  . CARDIAC CATHETERIZATION N/A 10/11/2016   Procedure: Left Heart Cath and Coronary Angiography;  Surgeon: Burnell Blanks, MD;  mRCA 100% w/ L>R collaterals, OM1 90%  . CARDIAC CATHETERIZATION N/A 10/11/2016   Procedure: Coronary Stent Intervention;  Surgeon: Burnell Blanks, MD;  Hinda Lenis 7.5Z02 DES OM1  . COLONOSCOPY  05/19/2012   Procedure: COLONOSCOPY;  Surgeon: Inda Castle, MD;  [facility];  Service: Endoscopy;  Laterality: N/A;  jill trying to contact pt to come in 0830 for 930 case, phone not accepting messages  . COLONOSCOPY    . ESOPHAGEAL MANOMETRY  05/29/2012   Procedure: ESOPHAGEAL MANOMETRY (EM);  Surgeon: Inda Castle, MD;  [facility];  Service: Endoscopy;  Laterality: N/A;  . ESOPHAGEAL MANOMETRY N/A 10/06/2015   Procedure: ESOPHAGEAL MANOMETRY (EM);  Surgeon: Mauri Pole, MD;  [facility];  Service: Endoscopy;  Laterality: N/A;  . ESOPHAGOGASTRODUODENOSCOPY  11/11/2011   Procedure: ESOPHAGOGASTRODUODENOSCOPY (EGD);  Surgeon: Inda Castle, MD;  Location: [facility];  Service: Endoscopy;  Laterality: N/A;  botox injection  called Pt to change time of procedure per Dr Deatra Ina  . ESOPHAGOGASTRODUODENOSCOPY  03/10/2012   Procedure: ESOPHAGOGASTRODUODENOSCOPY (EGD);  Surgeon: Inda Castle, MD;  Location: [facility];  Service: Endoscopy;  Laterality: N/A;  . ESOPHAGOGASTRODUODENOSCOPY  10/11/2012   Procedure: ESOPHAGOGASTRODUODENOSCOPY (EGD);  Surgeon: Inda Castle, MD;  Location: [facility];  Service: Endoscopy;  Laterality: N/A;  . ESOPHAGOGASTRODUODENOSCOPY N/A 01/25/2013   Procedure: ESOPHAGOGASTRODUODENOSCOPY (EGD);  Surgeon: Inda Castle, MD;  Location: [facility];  Service: Endoscopy;  Laterality: N/A;  . ESOPHAGOGASTRODUODENOSCOPY N/A 06/21/2013   Procedure: ESOPHAGOGASTRODUODENOSCOPY (EGD);  Surgeon: Inda Castle, MD;   Location: [facility];  Service: Endoscopy;  Laterality: N/A;  . ESOPHAGOGASTRODUODENOSCOPY (EGD) WITH PROPOFOL N/A 10/08/2013   Procedure: ESOPHAGOGASTRODUODENOSCOPY (EGD) WITH PROPOFOL;  Surgeon: Inda Castle, MD;  [facility];  Service: Endoscopy;  Laterality: N/A;  . ESOPHAGOGASTRODUODENOSCOPY (EGD) WITH PROPOFOL N/A 06/16/2015   Procedure: ESOPHAGOGASTRODUODENOSCOPY (EGD) WITH PROPOFOL;  Surgeon: Inda Castle, MD;  [facility];  Service: Endoscopy;  Laterality: N/A;  . ESOPHAGOGASTRODUODENOSCOPY (EGD) WITH PROPOFOL N/A 11/11/2015   Procedure: ESOPHAGOGASTRODUODENOSCOPY (EGD) WITH PROPOFOL;  Surgeon: Mauri Pole, MD;  [facility];  Service: Endoscopy;  Laterality: N/A;  . EXAMINATION UNDER ANESTHESIA  07/24/2012   Procedure: EXAM UNDER ANESTHESIA;  Surgeon: Adin Hector, MD;  [facility]: [facility];  Service: General;  Laterality: N/A;  . humeral fracture surgery Right yrs ago  . PILONIDAL CYST EXCISION  07/24/2012   Procedure: CYST EXCISION PILONIDAL SIMPLE;  Surgeon: Adin Hector, MD;  [facility]: [facility];  Service: General;  Laterality: N/A;  Exam Under Anesthesia,, Excision  Pilonidal Disease,   . right shoulder replacement  2008   x 2  . UPPER GASTROINTESTINAL ENDOSCOPY    . WART FULGURATION  07/24/2012   Procedure: FULGURATION ANAL WART;  Surgeon: Adin Hector, MD;  Location: [facility];  Service: General;  Laterality: N/A;  excision of raphe mass    Current Medications: Outpatient Medications Prior to Visit  Medication Sig Dispense Refill  . alum & mag hydroxide-simeth (MAALOX PLUS) 400-400-40 MG/5ML suspension Take 5 mLs by mouth every 6 (six) hours as needed for indigestion. 355 mL 0  . amLODipine (NORVASC) 2.5 MG tablet Take 1 tablet (2.5 mg total) by mouth daily. 90 tablet 3  . aspirin 81 MG chewable tablet Chew 1 tablet (81 mg total) by mouth daily. 90 tablet 3  . clopidogrel (PLAVIX) 75 MG tablet Take 1 tablet (75 mg total) by mouth daily  with breakfast. 90 tablet 3  . famotidine (PEPCID) 20 MG tablet Take 1 tablet (20 mg total) by mouth 2 (two) times daily. 60 tablet 0  . GENVOYA 150-150-200-10 MG TABS tablet TAKE 1 TABLET BY MOUTH DAILY WITH BREAKFAST. 90 tablet 1  . metoprolol succinate (TOPROL XL) 25 MG 24 hr tablet Take 0.5 tablets (12.5 mg total) by mouth daily. 45 tablet 3  . Naphazoline-Pheniramine (OPCON-A OP) Apply 1-2 drops to eye 2 (two) times daily as needed (dry eyes.).     Marland Kitchen pantoprazole (PROTONIX) 40 MG tablet Take 1 tablet (40 mg total) by mouth daily. 90 tablet 3  . PREZISTA 800 MG tablet TAKE 1 TABLET (800 MG TOTAL) BY MOUTH DAILY WITH BREAKFAST. 90 tablet 1  . VOLTAREN 1 % GEL Apply 4 g topically 4 (four) times daily. 100 g 3  . acetaminophen (TYLENOL 8 HOUR) 650 MG CR tablet Take 1 tablet (650 mg total) by mouth every 8 (eight) hours. 90 tablet 3  . nitroGLYCERIN (NITROSTAT) 0.4 MG SL tablet Place 1 tablet (0.4 mg total) under the tongue every 5 (five) minutes as needed for chest pain. 90 tablet 3  . rosuvastatin (CRESTOR) 20 MG tablet Take 1 tablet (20 mg total) by mouth daily. 90 tablet 3   No facility-administered medications prior to visit.      Allergies:   Citrus   Social History   Social History  . Marital status: Single    Spouse name: N/A  . Number of children: N/A  . Years of education: N/A   Occupational History  . Disabled    Social History Main Topics  . Smoking status: Former Smoker    Packs/day: 0.50    Years: 25.00    Types: Cigarettes    Quit date: 11/26/2015  . Smokeless tobacco: Never Used  . Alcohol use No  . Drug use: Yes    Frequency: 3.0 times per week    Types: Marijuana, Cocaine  . Sexual activity: Not Currently    Partners: Male     Comment: pt. given condoms, used marijuana -3 days ago 05/14/16   Other Topics Concern  . None   Social History Narrative   Pt lives in a ground floor apartment in [place].     Family History:  The patient's family history includes  Arthritis in his mother; Diabetes in his maternal grandmother; Heart disease in his mother; Hypertension in his mother; Stomach cancer in his paternal grandmother.   Review of Systems:   Please see the history of present illness.     General:  No chills, fever, night sweats or  weight changes.  Cardiovascular:  No dyspnea on exertion, edema, orthopnea, palpitations, paroxysmal nocturnal dyspnea. Positive for chest pain.  Dermatological: No rash, lesions/masses Respiratory: No cough, dyspnea Urologic: No hematuria, dysuria Abdominal:   No nausea, vomiting, diarrhea, bright red blood per rectum, melena, or hematemesis Neurologic:  No visual changes, wkns, changes in mental status. All other systems reviewed and are otherwise negative except as noted above.   Physical Exam:    VS:  BP 124/78   Pulse 75   Ht 6\' 1"  (1.854 m)   Wt 257 lb 12.8 oz (116.9 kg)   SpO2 97%   BMI 34.01 kg/m    General: Well developed, well nourished [place] American male appearing in no acute distress. Head: Normocephalic, atraumatic, sclera non-icteric, no xanthomas, nares are without discharge.  Neck: No carotid bruits. JVD not elevated.  Lungs: Respirations regular and unlabored, without wheezes or rales.  Heart: Regular rate and rhythm. No S3 or S4.  No murmur, no rubs, or gallops appreciated. Abdomen: Soft, non-tender, non-distended with normoactive bowel sounds. No hepatomegaly. No rebound/guarding. No obvious abdominal masses. Msk:  Strength and tone appear normal for age. No joint deformities or effusions. Extremities: No clubbing or cyanosis. No edema.  Distal pedal pulses are 2+ bilaterally. Neuro: Alert and oriented X 3. Moves all extremities spontaneously. No focal deficits noted. Psych:  Responds to questions appropriately with a normal affect. Skin: No rashes or lesions noted  Wt Readings from Last 3 Encounters:  05/25/17 257 lb 12.8 oz (116.9 kg)  05/02/17 248 lb (112.5 kg)  03/18/17 248 lb  3.2 oz (112.6 kg)     Studies/Labs Reviewed:   EKG:  EKG is not ordered today. EKG from 05/02/2017 is reviewed which shows NSR, HR 74, with no acute ST or T-wave changes.   Recent Labs: 01/18/2017: ALT 13 05/02/2017: BUN 12; Creatinine, Ser 1.22; Hemoglobin 13.9; Platelets 168; Potassium 4.0; Sodium 135   Lipid Panel    Component Value Date/Time   CHOL 144 01/26/2017 1526   TRIG 104 01/26/2017 1526   HDL 33 (L) 01/26/2017 1526   CHOLHDL 4.4 01/26/2017 1526   VLDL 21 01/26/2017 1526   LDLCALC 90 01/26/2017 1526    Additional studies/ records that were reviewed today include:   Cardiac Catheterization: 09/2016  Prox RCA lesion, 20 %stenosed.  Prox RCA to Mid RCA lesion, 99 %stenosed.  Mid RCA to Dist RCA lesion, 100 %stenosed.  1st Mrg-2 lesion, 60 %stenosed.  1st Mrg-1 lesion, 90 %stenosed.  Post intervention, there is a 0% residual stenosis.  A STENT RESOLUTE ONYX 3.5X15 drug eluting stent was successfully placed.  Ost LAD to Mid LAD lesion, 20 %stenosed.   1. Severe double vessel CAD 2. Unstable angina 3. Severe stenosis first obtuse marginal branch 4. Successful PTCA/DES x 1 OM1 5. Chronic total occlusion mid RCA. The distal vessel fills from left to right collaterals.   Recommendations: Will plan one year of DAPT with ASA and Plavix. Would start a statin. Likely discharge tomorrow. Follow up with Dr. Debara Pickett.   Echocardiogram: 09/2016 Study Conclusions  - Left ventricle: The cavity size was normal. Systolic function was   normal. The estimated ejection fraction was in the range of 60%   to 65%. Wall motion was normal; there were no regional wall   motion abnormalities. Left ventricular diastolic function   parameters were normal. - Atrial septum: No defect or patent foramen ovale was identified.  Assessment:    1. Coronary artery disease involving native  coronary artery of native heart with angina pectoris ([place])   2. Essential hypertension   3.  Hyperlipidemia LDL goal <70   4. HIV disease ([place])   5. Gastroesophageal reflux disease, esophagitis presence not specified      Plan:   In order of problems listed above:  1. CAD - recent cath in 09/2016 showed 100% RCA occlusion with collaterals and 90% OM1 which was treated with a DES. - he recently presented to [facility] ED with chest pain which was thought to be most consistent with GERD or a MSK etiology as cardiac enzymes remained negative and his pain was reproducible on palpitation. - he reports continued episodes of chest pain along his left pectoral region which occurs at rest and only lasts for seconds at a time. Sometimes worse with movements. Overall, this pain seems atypical for a cardiac etiology.  - continue ASA, Plavix, BB, and statin therapy. If symptoms persist despite titration of his PPI therapy, can try Imdur 30mg  daily to see if this improves his symptoms as he does have residual CAD and a known CTO.  2. HTN - BP is well-controlled at 124/78 during today's visit. - continue Amlodipine 2.5mg  daily and Toprol-XL 12.5mg  daily.   3. HLD - Lipid Panel in 01/2017 showed total cholesterol of 144, HDL 33, and LDL 90. Currently on Crestor 20mg  daily. He is not fasting today but will come back next week for a FLP and LFT's. If LDL is not at goal of < 70, will titrate Crestor to 40mg  daily.   4. HIV - followed by ID.   5. GERD - remains on Protonix 40mg  BID.  - followed by GI.    Medication Adjustments/Labs and Tests Ordered: Current medicines are reviewed at length with the patient today.  Concerns regarding medicines are outlined above.  Medication changes, Labs and Tests ordered today are listed in the Patient Instructions below. Patient Instructions  Continue same medications   Take Nitrostat under tongue 1 tablet every 5 mins up to 3 tablets.After 3 tablets if continues to have chest pain call 911.  Fasting lab work nothing to eat or drink after midnight  Tuesday 05/31/17 no appointment needed drop in at [facility] office 8:00 am to 12:00 noon  Your physician recommends that you schedule a follow-up appointment with Dr.Hilty Monday 10/03/17 at 10:00 am   Signed, Erma Heritage, PA-C  05/25/2017 [facility] [address], [place], [place]  [address] Phone: [phone]; Fax: [phone]  [address] [place], [place] [address] Phone: [phone]

## 2017-05-25 ENCOUNTER — Ambulatory Visit (INDEPENDENT_AMBULATORY_CARE_PROVIDER_SITE_OTHER): Payer: Medicaid Other | Admitting: Student

## 2017-05-25 ENCOUNTER — Encounter: Payer: Self-pay | Admitting: Student

## 2017-05-25 VITALS — BP 124/78 | HR 75 | Ht 73.0 in | Wt 257.8 lb

## 2017-05-25 DIAGNOSIS — I10 Essential (primary) hypertension: Secondary | ICD-10-CM

## 2017-05-25 DIAGNOSIS — B20 Human immunodeficiency virus [HIV] disease: Secondary | ICD-10-CM | POA: Diagnosis not present

## 2017-05-25 DIAGNOSIS — E785 Hyperlipidemia, unspecified: Secondary | ICD-10-CM

## 2017-05-25 DIAGNOSIS — K219 Gastro-esophageal reflux disease without esophagitis: Secondary | ICD-10-CM | POA: Diagnosis not present

## 2017-05-25 DIAGNOSIS — I25119 Atherosclerotic heart disease of native coronary artery with unspecified angina pectoris: Secondary | ICD-10-CM

## 2017-05-25 NOTE — Patient Instructions (Addendum)
Continue same medications    Take Nitrostat under tongue 1 tablet every 5 mins up to 3 tablets.After 3 tablets if continues to have chest pain call 911.   Fasting lab work nothing to eat or drink after midnight Tuesday 05/31/17 no appointment needed drop in at [facility] office 8:00 am to 12:00 noon   Your physician recommends that you schedule a follow-up appointment with Dr.Hilty Monday 10/03/17 at 10:00 am

## 2017-05-26 ENCOUNTER — Ambulatory Visit: Payer: Medicaid Other | Admitting: Family Medicine

## 2017-05-30 ENCOUNTER — Other Ambulatory Visit: Payer: Self-pay | Admitting: Infectious Diseases

## 2017-05-30 DIAGNOSIS — B20 Human immunodeficiency virus [HIV] disease: Secondary | ICD-10-CM

## 2017-05-31 ENCOUNTER — Other Ambulatory Visit: Payer: Medicaid Other

## 2017-06-07 ENCOUNTER — Other Ambulatory Visit: Payer: Medicaid Other

## 2017-06-07 DIAGNOSIS — I10 Essential (primary) hypertension: Secondary | ICD-10-CM

## 2017-06-07 DIAGNOSIS — I25119 Atherosclerotic heart disease of native coronary artery with unspecified angina pectoris: Secondary | ICD-10-CM

## 2017-06-07 LAB — HEPATIC FUNCTION PANEL
ALBUMIN: 4.6 g/dL (ref 3.5–5.5)
ALK PHOS: 73 IU/L (ref 39–117)
ALT: 12 IU/L (ref 0–44)
AST: 16 IU/L (ref 0–40)
Bilirubin Total: 0.3 mg/dL (ref 0.0–1.2)
Bilirubin, Direct: 0.11 mg/dL (ref 0.00–0.40)
TOTAL PROTEIN: 7.4 g/dL (ref 6.0–8.5)

## 2017-06-07 LAB — LIPID PANEL
CHOL/HDL RATIO: 3.5 ratio (ref 0.0–5.0)
Cholesterol, Total: 149 mg/dL (ref 100–199)
HDL: 43 mg/dL (ref >39)
LDL Calculated: 89 mg/dL (ref 0–99)
Triglycerides: 85 mg/dL (ref 0–149)
VLDL CHOLESTEROL CAL: 17 mg/dL (ref 5–40)

## 2017-06-10 ENCOUNTER — Telehealth: Payer: Self-pay | Admitting: Internal Medicine

## 2017-06-10 DIAGNOSIS — E785 Hyperlipidemia, unspecified: Secondary | ICD-10-CM

## 2017-06-10 DIAGNOSIS — Z79899 Other long term (current) drug therapy: Secondary | ICD-10-CM

## 2017-06-10 MED ORDER — ROSUVASTATIN CALCIUM 40 MG PO TABS: 40.0000 mg | ORAL_TABLET | Freq: Every day | ORAL | 1 refills | Status: DC

## 2017-06-10 NOTE — Telephone Encounter (Signed)
Patient calling, states that he is returning call for lab results. °

## 2017-06-10 NOTE — Telephone Encounter (Signed)
Results and recommendations discussed with patient, who verbalized understanding and thanks.  

## 2017-07-04 ENCOUNTER — Encounter: Payer: Self-pay | Admitting: Family Medicine

## 2017-07-04 ENCOUNTER — Ambulatory Visit: Payer: Medicaid Other | Attending: Family Medicine | Admitting: Family Medicine

## 2017-07-04 VITALS — BP 112/72 | HR 74 | Temp 98.0°F | Ht 73.0 in | Wt 266.0 lb

## 2017-07-04 DIAGNOSIS — Z7982 Long term (current) use of aspirin: Secondary | ICD-10-CM | POA: Insufficient documentation

## 2017-07-04 DIAGNOSIS — Z96611 Presence of right artificial shoulder joint: Secondary | ICD-10-CM | POA: Insufficient documentation

## 2017-07-04 DIAGNOSIS — J4520 Mild intermittent asthma, uncomplicated: Secondary | ICD-10-CM

## 2017-07-04 DIAGNOSIS — G4730 Sleep apnea, unspecified: Secondary | ICD-10-CM | POA: Diagnosis not present

## 2017-07-04 DIAGNOSIS — M5126 Other intervertebral disc displacement, lumbar region: Secondary | ICD-10-CM | POA: Diagnosis not present

## 2017-07-04 DIAGNOSIS — R200 Anesthesia of skin: Secondary | ICD-10-CM | POA: Diagnosis not present

## 2017-07-04 DIAGNOSIS — K219 Gastro-esophageal reflux disease without esophagitis: Secondary | ICD-10-CM | POA: Diagnosis not present

## 2017-07-04 DIAGNOSIS — Z7902 Long term (current) use of antithrombotics/antiplatelets: Secondary | ICD-10-CM | POA: Diagnosis not present

## 2017-07-04 DIAGNOSIS — I2510 Atherosclerotic heart disease of native coronary artery without angina pectoris: Secondary | ICD-10-CM | POA: Diagnosis not present

## 2017-07-04 DIAGNOSIS — Z955 Presence of coronary angioplasty implant and graft: Secondary | ICD-10-CM | POA: Insufficient documentation

## 2017-07-04 DIAGNOSIS — Z79899 Other long term (current) drug therapy: Secondary | ICD-10-CM | POA: Diagnosis not present

## 2017-07-04 DIAGNOSIS — I10 Essential (primary) hypertension: Secondary | ICD-10-CM | POA: Insufficient documentation

## 2017-07-04 DIAGNOSIS — G8929 Other chronic pain: Secondary | ICD-10-CM | POA: Diagnosis not present

## 2017-07-04 DIAGNOSIS — M5136 Other intervertebral disc degeneration, lumbar region: Secondary | ICD-10-CM

## 2017-07-04 DIAGNOSIS — Z21 Asymptomatic human immunodeficiency virus [HIV] infection status: Secondary | ICD-10-CM | POA: Insufficient documentation

## 2017-07-04 DIAGNOSIS — M549 Dorsalgia, unspecified: Secondary | ICD-10-CM | POA: Diagnosis present

## 2017-07-04 MED ORDER — ALBUTEROL SULFATE HFA 108 (90 BASE) MCG/ACT IN AERS: 2.0000 | INHALATION_SPRAY | Freq: Four times a day (QID) | RESPIRATORY_TRACT | 2 refills | Status: DC | PRN

## 2017-07-04 MED ORDER — CYCLOBENZAPRINE HCL 10 MG PO TABS: 10.0000 mg | ORAL_TABLET | Freq: Two times a day (BID) | ORAL | 1 refills | Status: DC | PRN

## 2017-07-04 MED ORDER — GABAPENTIN 300 MG PO CAPS: 300.0000 mg | ORAL_CAPSULE | Freq: Two times a day (BID) | ORAL | 3 refills | Status: DC

## 2017-07-04 NOTE — Progress Notes (Signed)
Subjective:  Patient ID: Benjamin Herrera, male    DOB: [birth year]  Age: 51 y.o. MRN: [id number]  CC: Back Pain   HPI Benjamin Herrera is a 51 year old male with a history of HIV,  hypertension, coronary artery disease (status post stent), chronic low back pain for the last 3 years to presents today to establish care with me. He was previously followed by Dr. Adrian Blackwater Cardiac cath from 09/2016 revealed severe two vessel coronary artery disease ( mid RCA to distal RCA lesion, 100% stenosis, chronically occluded; first marginal-1 lesion, 90% stenosed) underwent PTCA/DES x1 to OM1. He denies chest pains, shortness of breath or pedal edema and last visit to cardiology was on  05/25/17 - notes reviewed.  He complains of chronic low back pain which he has had for the last 3 years and sometimes radiates down his right lower extremity and has occasional lower extremity numbness but denies loss of sphincteric function. Pain is rated as moderate and prevents him from standing or walking too long.  MRI from 03/2017 reveal bulging L4-L5, mild facet arthropathy, mild stenosis; L5-S1 annular bulge no impingement. He currently does not have any pain medication.  Today he requests a refill. MDI which he uses intermittently for asthma. His symptoms are controlled and he rarely needs to use his MDI.  Past Medical History:  Diagnosis Date  . Achalasia and cardiospasm   . Allergy   . Arthritis    back ,shoulders   . Asthma   . Boils    under arms hx of  . CAD (coronary artery disease)    a. 09/2016: cath showing 100% RCA occlusion with collaterals and 90% OM1 (treated with DES)  . Candidiasis of mouth   . Candidiasis of unspecified site   . Cholelithiasis   . Cocaine abuse    Quit 09/2016  . Condyloma acuminatum in male of scrotum & anal canal s/p laser ablation 07/03/2012  . Coronary artery disease   . Dysphagia   . Fracture, humerus   . GERD (gastroesophageal reflux disease)   . Heart attack ([place])   .  Hemorrhoids   . Hepatitis 1993   A  . Herpes labialis   . HIV (human immunodeficiency virus infection) ([place]) [place]  . Hypertension Dx 2014   off all bp meds for last 9 or 10 months  . Pilonidal disease 01/10/2012  . Polysubstance abuse   . Psoriasis    hx of  . Sleep apnea    does  have CPAP  . 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 BALLOON DILATION N/A 11/11/2015   Procedure: BALLOON DILATION;  Surgeon: Mauri Pole, MD;  Location: [facility];  Service: Endoscopy;  Laterality: N/A;  . BOTOX INJECTION  10/11/2012   Procedure: BOTOX INJECTION;  Surgeon: Inda Castle, MD;  Location: [facility];  Service: Endoscopy;  Laterality: N/A;  . BOTOX INJECTION N/A 01/25/2013   Procedure: BOTOX INJECTION;  Surgeon: Inda Castle, MD;  Location: [facility];  Service: Endoscopy;  Laterality: N/A;  . BOTOX INJECTION N/A 06/21/2013   Procedure: BOTOX INJECTION;  Surgeon: Inda Castle, MD;  Location: [facility];  Service: Endoscopy;  Laterality: N/A;  . BOTOX INJECTION N/A 10/08/2013   Procedure: BOTOX INJECTION;  Surgeon: Inda Castle, MD;  Location: [facility];  Service: Endoscopy;  Laterality: N/A;  . BOTOX INJECTION N/A 06/16/2015   Procedure: BOTOX INJECTION;  Surgeon: Inda Castle, MD;  Location: [facility];  Service: Endoscopy;  Laterality: N/A;  . CARDIAC CATHETERIZATION N/A 10/11/2016   Procedure: Left Heart Cath and Coronary Angiography;  Surgeon: Burnell Blanks, MD;  mRCA 100% w/ L>R collaterals, OM1 90%  . CARDIAC CATHETERIZATION N/A 10/11/2016   Procedure: Coronary Stent Intervention;  Surgeon: Burnell Blanks, MD;  Hinda Lenis 4.0J81 DES OM1  . COLONOSCOPY  05/19/2012   Procedure: COLONOSCOPY;  Surgeon: Inda Castle, MD;  Location: [facility];  Service:  Endoscopy;  Laterality: N/A;  jill trying to contact pt to come in 0830 for 930 case, phone not accepting messages  . COLONOSCOPY    . ESOPHAGEAL MANOMETRY  05/29/2012   Procedure: ESOPHAGEAL MANOMETRY (EM);  Surgeon: Inda Castle, MD;  Location: [facility];  Service: Endoscopy;  Laterality: N/A;  . ESOPHAGEAL MANOMETRY N/A 10/06/2015   Procedure: ESOPHAGEAL MANOMETRY (EM);  Surgeon: Mauri Pole, MD;  Location: [facility];  Service: Endoscopy;  Laterality: N/A;  . ESOPHAGOGASTRODUODENOSCOPY  11/11/2011   Procedure: ESOPHAGOGASTRODUODENOSCOPY (EGD);  Surgeon: Inda Castle, MD;  Location: [facility];  Service: Endoscopy;  Laterality: N/A;  botox injection  called Pt to change time of procedure per Dr Deatra Ina  . ESOPHAGOGASTRODUODENOSCOPY  03/10/2012   Procedure: ESOPHAGOGASTRODUODENOSCOPY (EGD);  Surgeon: Inda Castle, MD;  Location: [facility];  Service: Endoscopy;  Laterality: N/A;  . ESOPHAGOGASTRODUODENOSCOPY  10/11/2012   Procedure: ESOPHAGOGASTRODUODENOSCOPY (EGD);  Surgeon: Inda Castle, MD;  Location: [facility];  Service: Endoscopy;  Laterality: N/A;  . ESOPHAGOGASTRODUODENOSCOPY N/A 01/25/2013   Procedure: ESOPHAGOGASTRODUODENOSCOPY (EGD);  Surgeon: Inda Castle, MD;  Location: [facility];  Service: Endoscopy;  Laterality: N/A;  . ESOPHAGOGASTRODUODENOSCOPY N/A 06/21/2013   Procedure: ESOPHAGOGASTRODUODENOSCOPY (EGD);  Surgeon: Inda Castle, MD;  Location: [facility];  Service: Endoscopy;  Laterality: N/A;  . ESOPHAGOGASTRODUODENOSCOPY (EGD) WITH PROPOFOL N/A 10/08/2013   Procedure: ESOPHAGOGASTRODUODENOSCOPY (EGD) WITH PROPOFOL;  Surgeon: Inda Castle, MD;  [facility]: [facility];  Service: Endoscopy;  Laterality: N/A;  . ESOPHAGOGASTRODUODENOSCOPY (EGD) WITH PROPOFOL N/A 06/16/2015   Procedure: ESOPHAGOGASTRODUODENOSCOPY (EGD) WITH PROPOFOL;  Surgeon: Inda Castle, MD;  Location: [facility];  Service: Endoscopy;  Laterality: N/A;  .  ESOPHAGOGASTRODUODENOSCOPY (EGD) WITH PROPOFOL N/A 11/11/2015   Procedure: ESOPHAGOGASTRODUODENOSCOPY (EGD) WITH PROPOFOL;  Surgeon: Mauri Pole, MD;  Location: [facility];  Service: Endoscopy;  Laterality: N/A;  . EXAMINATION UNDER ANESTHESIA  07/24/2012   Procedure: EXAM UNDER ANESTHESIA;  Surgeon: Adin Hector, MD;  Location: [facility];  Service: General;  Laterality: N/A;  . humeral fracture surgery Right yrs ago  . PILONIDAL CYST EXCISION  07/24/2012   Procedure: CYST EXCISION PILONIDAL SIMPLE;  Surgeon: Adin Hector, MD;  Location: [facility];  Service: General;  Laterality: N/A;  Exam Under Anesthesia,, Excision Pilonidal Disease,   . right shoulder replacement  2008   x 2  . UPPER GASTROINTESTINAL ENDOSCOPY    . WART FULGURATION  07/24/2012   Procedure: FULGURATION ANAL WART;  Surgeon: Adin Hector, MD;  Location: [facility];  Service: General;  Laterality: N/A;  excision of raphe mass    Allergies  Allergen Reactions  . Citrus Swelling    Mouth itching and swelling      Outpatient Medications Prior to Visit  Medication Sig Dispense Refill  . alum & mag hydroxide-simeth (MAALOX PLUS) 400-400-40 MG/5ML suspension Take 5 mLs  by mouth every 6 (six) hours as needed for indigestion. 355 mL 0  . amLODipine (NORVASC) 2.5 MG tablet Take 1 tablet (2.5 mg total) by mouth daily. 90 tablet 3  . aspirin 81 MG chewable tablet Chew 1 tablet (81 mg total) by mouth daily. 90 tablet 3  . clopidogrel (PLAVIX) 75 MG tablet Take 1 tablet (75 mg total) by mouth daily with breakfast. 90 tablet 3  . GENVOYA 150-150-200-10 MG TABS tablet TAKE 1 TABLET BY MOUTH DAILY WITH BREAKFAST. 90 tablet 0  . metoprolol succinate (TOPROL XL) 25 MG 24 hr tablet Take 0.5 tablets (12.5 mg total) by mouth daily. 45 tablet 3  . Naphazoline-Pheniramine (OPCON-A OP) Apply 1-2 drops to eye 2 (two) times daily as needed (dry eyes.).     Marland Kitchen pantoprazole (PROTONIX) 40 MG tablet Take 1 tablet (40 mg total) by mouth daily. 90  tablet 3  . PREZISTA 800 MG tablet TAKE 1 TABLET (800 MG TOTAL) BY MOUTH DAILY WITH BREAKFAST. 90 tablet 0  . rosuvastatin (CRESTOR) 40 MG tablet Take 1 tablet (40 mg total) by mouth daily. 90 tablet 1  . VOLTAREN 1 % GEL Apply 4 g topically 4 (four) times daily. 100 g 3  . famotidine (PEPCID) 20 MG tablet Take 1 tablet (20 mg total) by mouth 2 (two) times daily. (Patient not taking: Reported on 07/04/2017) 60 tablet 0  . nitroGLYCERIN (NITROSTAT) 0.4 MG SL tablet Place 1 tablet (0.4 mg total) under the tongue every 5 (five) minutes as needed for chest pain. 90 tablet 3   No facility-administered medications prior to visit.     ROS Review of Systems  Constitutional: Negative for activity change and appetite change.  HENT: Negative for sinus pressure and sore throat.   Eyes: Negative for visual disturbance.  Respiratory: Negative for cough, chest tightness and shortness of breath.   Cardiovascular: Negative for chest pain and leg swelling.  Gastrointestinal: Negative for abdominal distention, abdominal pain, constipation and diarrhea.  Endocrine: Negative.   Genitourinary: Negative for dysuria.  Musculoskeletal: Positive for back pain. Negative for joint swelling and myalgias.  Skin: Negative for rash.  Allergic/Immunologic: Negative.   Neurological: Positive for numbness. Negative for weakness and light-headedness.  Psychiatric/Behavioral: Negative for dysphoric mood and suicidal ideas.    Objective:  BP 112/72   Pulse 74   Temp 98 F (36.7 C) (Oral)   Ht 6\' 1"  (1.854 m)   Wt 266 lb (120.7 kg)   SpO2 95%   BMI 35.09 kg/m   BP/Weight 07/04/2017 12/25/6710 01/27/8098  Systolic BP 833 825 053  Diastolic BP 72 78 71  Wt. (Lbs) 266 257.8 248  BMI 35.09 34.01 32.72      Physical Exam  Constitutional: He is oriented to person, place, and time. He appears well-developed and well-nourished.  Cardiovascular: Normal rate, normal heart sounds and intact distal pulses.   No murmur  heard. Pulmonary/Chest: Effort normal and breath sounds normal. He has no wheezes. He has no rales. He exhibits no tenderness.  Abdominal: Soft. Bowel sounds are normal. He exhibits no distension and no mass. There is no tenderness.  Musculoskeletal: Normal range of motion. He exhibits no edema or tenderness.  Normal appearance of spine, negative straight leg raise bilaterally  Neurological: He is alert and oriented to person, place, and time. He has normal reflexes.  Skin: Skin is warm and dry.  Psychiatric: He has a normal mood and affect.     Assessment & Plan:   1.  Bulging lumbar disc If symptoms do not improve I would add tramadol to regimen and discuss referral to PT and or referral for epidural spinal injections. - gabapentin (NEURONTIN) 300 MG capsule; Take 1 capsule (300 mg total) by mouth 2 (two) times daily.  Dispense: 60 capsule; Refill: 3 - cyclobenzaprine (FLEXERIL) 10 MG tablet; Take 1 tablet (10 mg total) by mouth 2 (two) times daily as needed for muscle spasms.  Dispense: 50 tablet; Refill: 1  2. Mild intermittent asthma without complication No acute flare Controlled on MDI when necessary - albuterol (PROVENTIL HFA;VENTOLIN HFA) 108 (90 Base) MCG/ACT inhaler; Inhale 2 puffs into the lungs every 6 (six) hours as needed for wheezing or shortness of breath.  Dispense: 1 Inhaler; Refill: 2  3. Essential hypertension Controlled Low-sodium,DASH diet  4. CAD in native artery Status post stent Continue Plavix and aspirin for the next 1 year. Risk factor modification, follow up with cardiology.   Meds ordered this encounter  Medications  . gabapentin (NEURONTIN) 300 MG capsule    Sig: Take 1 capsule (300 mg total) by mouth 2 (two) times daily.    Dispense:  60 capsule    Refill:  3  . cyclobenzaprine (FLEXERIL) 10 MG tablet    Sig: Take 1 tablet (10 mg total) by mouth 2 (two) times daily as needed for muscle spasms.    Dispense:  50 tablet    Refill:  1  . albuterol  (PROVENTIL HFA;VENTOLIN HFA) 108 (90 Base) MCG/ACT inhaler    Sig: Inhale 2 puffs into the lungs every 6 (six) hours as needed for wheezing or shortness of breath.    Dispense:  1 Inhaler    Refill:  2    Follow-up: Return in about 6 weeks (around 08/15/2017) for follow up of back pain.   Arnoldo Morale MD

## 2017-07-04 NOTE — Patient Instructions (Signed)

## 2017-07-06 ENCOUNTER — Other Ambulatory Visit: Payer: Self-pay | Admitting: Pharmacist

## 2017-07-06 MED ORDER — GABAPENTIN 600 MG PO TABS: 600.0000 mg | ORAL_TABLET | Freq: Every day | ORAL | 3 refills | Status: DC

## 2017-07-06 NOTE — Telephone Encounter (Signed)
Received fax from [facility] that patient is unable to swallow capsules and would like the tablets. Discussed with Dr. Jarold Song and she would like patient to change from gabapentin 300 mg BID to gabapentin 600 mg daily as 300 mg is not an option for tablets. Prescription updated and sent to pharmacy. Gabapentin prior auth completed pre-emptively, it is approved [id number]

## 2017-07-19 ENCOUNTER — Ambulatory Visit (INDEPENDENT_AMBULATORY_CARE_PROVIDER_SITE_OTHER): Payer: Medicaid Other | Admitting: Gastroenterology

## 2017-07-19 ENCOUNTER — Telehealth: Payer: Self-pay | Admitting: *Deleted

## 2017-07-19 ENCOUNTER — Encounter: Payer: Self-pay | Admitting: Gastroenterology

## 2017-07-19 VITALS — BP 116/78 | HR 72 | Ht 73.0 in | Wt 259.6 lb

## 2017-07-19 DIAGNOSIS — R1310 Dysphagia, unspecified: Secondary | ICD-10-CM | POA: Diagnosis not present

## 2017-07-19 DIAGNOSIS — R12 Heartburn: Secondary | ICD-10-CM

## 2017-07-19 DIAGNOSIS — R1311 Dysphagia, oral phase: Secondary | ICD-10-CM

## 2017-07-19 MED ORDER — PANTOPRAZOLE SODIUM 40 MG PO TBEC: 40.0000 mg | DELAYED_RELEASE_TABLET | Freq: Two times a day (BID) | ORAL | 6 refills | Status: DC

## 2017-07-19 MED ORDER — FLUCONAZOLE 100 MG PO TABS
100.0000 mg | ORAL_TABLET | Freq: Every day | ORAL | 0 refills | Status: DC
Start: 2017-07-19 — End: 2017-10-06

## 2017-07-19 NOTE — Patient Instructions (Signed)
You will be contacted by our office prior to your procedure for directions on holding your Plavix.  If you do not hear from our office 1 week prior to your scheduled procedure, please call [phone] to discuss.   You have been scheduled for an endoscopy. Please follow written instructions given to you at your visit today. If you use inhalers (even only as needed), please bring them with you on the day of your procedure. Your physician has requested that you go to www.[email or web address].[email or web address] and enter the access code given to you at your visit today. This web site gives a general overview about your procedure. However, you should still follow specific instructions given to you by our office regarding your preparation for the procedure.  We will send your Protonix and Fluconazole

## 2017-07-19 NOTE — Progress Notes (Signed)
Benjamin Herrera    [id number]    01/25/1951  Primary Care Physician:Amao, Charlane Ferretti, MD  Referring Physician: Boykin Nearing, MD [address] [place], [place] [address]  Chief complaint:  Dysphagia  HPI: 51 yr M with history of HIV on therapy (HAART) here for follow-up visit with complaints of progressive worsening difficulty swallowing, heartburn and discomfort in swallowing in the past few weeks to months. Patient has history of distal esophageal stricture and also esophageal dysmotility. Has had multiple esophageal dilations and Botox injections at [facility] with some transient improvement. He was hospitalized in December 2017 with MI/unstable angina and was noted to have total occlusion of RCA with collaterals, had stent placement and is currently on aspirin and Plavix. Patient is not using cocaine and he also quit smoking cigarettes and alcohol He has gained weight in the past 1 year. Difficulty swallowing is worse with pills and food. Denies any nausea or vomiting. No abdominal pain. Denies any change in bowel habits, melena or blood per rectum.   Outpatient Encounter Prescriptions as of 07/19/2017  Medication Sig  . albuterol (PROVENTIL HFA;VENTOLIN HFA) 108 (90 Base) MCG/ACT inhaler Inhale 2 puffs into the lungs every 6 (six) hours as needed for wheezing or shortness of breath.  Marland Kitchen alum & mag hydroxide-simeth (MAALOX PLUS) 400-400-40 MG/5ML suspension Take 5 mLs by mouth every 6 (six) hours as needed for indigestion.  Marland Kitchen amLODipine (NORVASC) 2.5 MG tablet Take 1 tablet (2.5 mg total) by mouth daily.  Marland Kitchen aspirin 81 MG chewable tablet Chew 1 tablet (81 mg total) by mouth daily.  . clopidogrel (PLAVIX) 75 MG tablet Take 1 tablet (75 mg total) by mouth daily with breakfast.  . cyclobenzaprine (FLEXERIL) 10 MG tablet Take 1 tablet (10 mg total) by mouth 2 (two) times daily as needed for muscle spasms.  Marland Kitchen gabapentin (NEURONTIN) 600 MG tablet Take 1 tablet (600 mg total) by mouth at  bedtime.  . GENVOYA 150-150-200-10 MG TABS tablet TAKE 1 TABLET BY MOUTH DAILY WITH BREAKFAST.  . metoprolol succinate (TOPROL XL) 25 MG 24 hr tablet Take 0.5 tablets (12.5 mg total) by mouth daily.  . Naphazoline-Pheniramine (OPCON-A OP) Apply 1-2 drops to eye 2 (two) times daily as needed (dry eyes.).   Marland Kitchen pantoprazole (PROTONIX) 40 MG tablet Take 1 tablet (40 mg total) by mouth daily.  Marland Kitchen PREZISTA 800 MG tablet TAKE 1 TABLET (800 MG TOTAL) BY MOUTH DAILY WITH BREAKFAST.  . rosuvastatin (CRESTOR) 40 MG tablet Take 1 tablet (40 mg total) by mouth daily.  . VOLTAREN 1 % GEL Apply 4 g topically 4 (four) times daily.  . nitroGLYCERIN (NITROSTAT) 0.4 MG SL tablet Place 1 tablet (0.4 mg total) under the tongue every 5 (five) minutes as needed for chest pain.  . [DISCONTINUED] famotidine (PEPCID) 20 MG tablet Take 1 tablet (20 mg total) by mouth 2 (two) times daily. (Patient not taking: Reported on 07/04/2017)   No facility-administered encounter medications on file as of 07/19/2017.     Allergies as of 07/19/2017 - Review Complete 07/19/2017  Allergen Reaction Noted  . Citrus Swelling 11/04/2016    Past Medical History:  Diagnosis Date  . Achalasia and cardiospasm   . Allergy   . Arthritis    back ,shoulders   . Asthma   . Boils    under arms hx of  . CAD (coronary artery disease)    a. 09/2016: cath showing 100% RCA occlusion with collaterals  and 90% OM1 (treated with DES)  . Candidiasis of mouth   . Candidiasis of unspecified site   . Cholelithiasis   . Cocaine abuse    Quit 09/2016  . Condyloma acuminatum in male of scrotum & anal canal s/p laser ablation 07/03/2012  . Coronary artery disease   . Dysphagia   . Fracture, humerus   . GERD (gastroesophageal reflux disease)   . Heart attack ([place])   . Hemorrhoids   . Hepatitis 1993   A  . Herpes labialis   . HIV (human immunodeficiency virus infection) ([place]) [place]  . Hypertension Dx 2014   off all bp meds for last 9 or 10 months    . Pilonidal disease 01/10/2012  . Polysubstance abuse   . Psoriasis    hx of  . Sleep apnea    does  have CPAP  . 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 BALLOON DILATION N/A 11/11/2015   Procedure: BALLOON DILATION;  Surgeon: Mauri Pole, MD;  Location: [facility];  Service: Endoscopy;  Laterality: N/A;  . BOTOX INJECTION  10/11/2012   Procedure: BOTOX INJECTION;  Surgeon: Inda Castle, MD;  Location: [facility];  Service: Endoscopy;  Laterality: N/A;  . BOTOX INJECTION N/A 01/25/2013   Procedure: BOTOX INJECTION;  Surgeon: Inda Castle, MD;  [facility]: [facility];  Service: Endoscopy;  Laterality: N/A;  . BOTOX INJECTION N/A 06/21/2013   Procedure: BOTOX INJECTION;  Surgeon: Inda Castle, MD;  Location: [facility];  Service: Endoscopy;  Laterality: N/A;  . BOTOX INJECTION N/A 10/08/2013   Procedure: BOTOX INJECTION;  Surgeon: Inda Castle, MD;  [facility];  Service: Endoscopy;  Laterality: N/A;  . BOTOX INJECTION N/A 06/16/2015   Procedure: BOTOX INJECTION;  Surgeon: Inda Castle, MD;  [facility]: [facility];  Service: Endoscopy;  Laterality: N/A;  . CARDIAC CATHETERIZATION N/A 10/11/2016   Procedure: Left Heart Cath and Coronary Angiography;  Surgeon: Burnell Blanks, MD;  mRCA 100% w/ L>R collaterals, OM1 90%  . CARDIAC CATHETERIZATION N/A 10/11/2016   Procedure: Coronary Stent Intervention;  Surgeon: Burnell Blanks, MD;  Hinda Lenis 8.2U23 DES OM1  . COLONOSCOPY  05/19/2012   Procedure: COLONOSCOPY;  Surgeon: Inda Castle, MD;  [facility];  Service: Endoscopy;  Laterality: N/A;  jill trying to contact pt to come in 0830 for 930 case, phone not accepting messages  . COLONOSCOPY    . ESOPHAGEAL MANOMETRY  05/29/2012   Procedure: ESOPHAGEAL  MANOMETRY (EM);  Surgeon: Inda Castle, MD;  [facility]: [facility];  Service: Endoscopy;  Laterality: N/A;  . ESOPHAGEAL MANOMETRY N/A 10/06/2015   Procedure: ESOPHAGEAL MANOMETRY (EM);  Surgeon: Mauri Pole, MD;  [facility];  Service: Endoscopy;  Laterality: N/A;  . ESOPHAGOGASTRODUODENOSCOPY  11/11/2011   Procedure: ESOPHAGOGASTRODUODENOSCOPY (EGD);  Surgeon: Inda Castle, MD;  Location: [facility];  Service: Endoscopy;  Laterality: N/A;  botox injection  called Pt to change time of procedure per Dr Deatra Ina  . ESOPHAGOGASTRODUODENOSCOPY  03/10/2012   Procedure: ESOPHAGOGASTRODUODENOSCOPY (EGD);  Surgeon: Inda Castle, MD;  Location: [facility];  Service: Endoscopy;  Laterality: N/A;  . ESOPHAGOGASTRODUODENOSCOPY  10/11/2012   Procedure: ESOPHAGOGASTRODUODENOSCOPY (EGD);  Surgeon: Inda Castle, MD;  Location: [facility];  Service: Endoscopy;  Laterality: N/A;  .  ESOPHAGOGASTRODUODENOSCOPY N/A 01/25/2013   Procedure: ESOPHAGOGASTRODUODENOSCOPY (EGD);  Surgeon: Inda Castle, MD;  Location: [facility];  Service: Endoscopy;  Laterality: N/A;  . ESOPHAGOGASTRODUODENOSCOPY N/A 06/21/2013   Procedure: ESOPHAGOGASTRODUODENOSCOPY (EGD);  Surgeon: Inda Castle, MD;  Location: [facility];  Service: Endoscopy;  Laterality: N/A;  . ESOPHAGOGASTRODUODENOSCOPY (EGD) WITH PROPOFOL N/A 10/08/2013   Procedure: ESOPHAGOGASTRODUODENOSCOPY (EGD) WITH PROPOFOL;  Surgeon: Inda Castle, MD;  [facility];  Service: Endoscopy;  Laterality: N/A;  . ESOPHAGOGASTRODUODENOSCOPY (EGD) WITH PROPOFOL N/A 06/16/2015   Procedure: ESOPHAGOGASTRODUODENOSCOPY (EGD) WITH PROPOFOL;  Surgeon: Inda Castle, MD;  [facility];  Service: Endoscopy;  Laterality: N/A;  . ESOPHAGOGASTRODUODENOSCOPY (EGD) WITH PROPOFOL N/A 11/11/2015   Procedure: ESOPHAGOGASTRODUODENOSCOPY (EGD) WITH PROPOFOL;  Surgeon: Mauri Pole, MD;  [facility];  Service: Endoscopy;   Laterality: N/A;  . EXAMINATION UNDER ANESTHESIA  07/24/2012   Procedure: EXAM UNDER ANESTHESIA;  Surgeon: Adin Hector, MD;  Location: [facility];  Service: General;  Laterality: N/A;  . humeral fracture surgery Right yrs ago  . PILONIDAL CYST EXCISION  07/24/2012   Procedure: CYST EXCISION PILONIDAL SIMPLE;  Surgeon: Adin Hector, MD;  Location: [facility];  Service: General;  Laterality: N/A;  Exam Under Anesthesia,, Excision Pilonidal Disease,   . right shoulder replacement  2008   x 2  . UPPER GASTROINTESTINAL ENDOSCOPY    . WART FULGURATION  07/24/2012   Procedure: FULGURATION ANAL WART;  Surgeon: Adin Hector, MD;  Location: [facility];  Service: General;  Laterality: N/A;  excision of raphe mass    Family History  Problem Relation Age of Onset  . Arthritis Mother   . Hypertension Mother   . Heart disease Mother        MI age 62  . Diabetes Maternal Grandmother   . Stomach cancer Paternal Grandmother   . Colon cancer Neg Hx   . Colon polyps Neg Hx   . Esophageal cancer Neg Hx   . Rectal cancer Neg Hx     Social History   Social History  . Marital status: Single    Spouse name: N/A  . Number of children: N/A  . Years of education: N/A   Occupational History  . Disabled    Social History Main Topics  . Smoking status: Former Smoker    Packs/day: 0.50    Years: 25.00    Types: Cigarettes    Quit date: 11/26/2015  . Smokeless tobacco: Never Used  . Alcohol use No  . Drug use: Yes    Frequency: 3.0 times per week    Types: Marijuana, Cocaine  . Sexual activity: Not Currently    Partners: Male     Comment: pt. given condoms, used marijuana -3 days ago 05/14/16   Other Topics Concern  . Not on file   Social History Narrative   Pt lives in a ground floor apartment in [place].      Review of systems: Review of Systems  Constitutional: Negative for fever and chills.  HENT: Negative.   Eyes: Negative for blurred vision.  Respiratory: Negative for cough, shortness of  breath and wheezing.   Cardiovascular: Negative for chest pain and palpitations.  Gastrointestinal: as per HPI Genitourinary: Negative for dysuria, urgency, frequency and hematuria.  Musculoskeletal:Positive for myalgias, back pain and joint pain.  Skin: Negative for itching and rash.  Neurological: Negative for dizziness, tremors, focal weakness, seizures and loss of consciousness.  Endo/Heme/Allergies: Positive for seasonal allergies.  Psychiatric/Behavioral: Negative  for depression, suicidal ideas and hallucinations.  All other systems reviewed and are negative.   Physical Exam: Vitals:   07/19/17 0959  BP: 116/78  Pulse: 72   Body mass index is 34.25 kg/m. Gen:      No acute distress HEENT:  EOMI, sclera anicteric Neck:     No masses; no thyromegaly Lungs:    Clear to auscultation bilaterally; normal respiratory effort CV:         Regular rate and rhythm; no murmurs Abd:      + bowel sounds; soft, non-tender; no palpable masses, no distension Ext:    No edema; adequate peripheral perfusion Skin:      Warm and dry; no rash Neuro: alert and oriented x 3 Psych: normal mood and affect  Data Reviewed:  Reviewed labs, radiology imaging, old records and pertinent past GI work up   Assessment and Plan/Recommendations:  51 year old male with history of HIV on HAART, distal esophageal stricture, status post multiple esophageal dilation, Botox injection, esophageal dysmotility here with complaints of dysphagia and odynophagia No evidence of oropharyngeal candidiasis that given his complaints of odynophagia, empirically treat with fluconazole 100 mg daily for 10 days Increase Protonix 40 mg twice daily Discussed antireflux measures and lifestyle changes Plan for EGD with possible distal esophageal balloon dilation We'll discuss with cardiology if okay to hold her Plavix 5 days prior to the procedure Return after the procedures. The risks and benefits as well as alternatives of  endoscopic procedure(s) have been discussed and reviewed. All questions answered. The patient agrees to proceed.    Damaris Hippo , MD [phone] Mon-Fri 8a-5p [phone] after 5p, weekends, holidays  CC: Boykin Nearing, MD

## 2017-07-19 NOTE — Telephone Encounter (Signed)
  07/19/2017   RE: Benjamin Herrera DOB: 14-Sep-1966 MRN: [id number]   Dear  Benjamin Herrera,   We have scheduled the above patient for an endoscopic procedure. Our records show that he is on anticoagulation therapy.   Please advise as to how long the patient may come off his therapy of Plavix prior to the procedure, which is scheduled for 08/10/2017.  Please fax back/ or route the completed form to [facility] [facility] at [phone].   Sincerely,    Tonita Phoenix AAMA

## 2017-07-20 NOTE — Telephone Encounter (Signed)
Needs to remain on dual-antiplatelet therapy (plavix and aspirin) until 10/11/2017, after than plavix can be discontinued and he can be scheduled for elective endoscopy. Cannot advise holding plavix now due to drug-eluting stent placement on 10/11/2016.  Dr. Lemmie Evens

## 2017-07-20 NOTE — Telephone Encounter (Signed)
Dr Silverio Decamp Please advise  Patient can not come off blood thinner at this time

## 2017-07-21 NOTE — Telephone Encounter (Signed)
Cancelled patients EGD and scheduled him for a barium esophagram per Dr Silverio Decamp  Mailed patient instructions today Patient aware

## 2017-07-21 NOTE — Telephone Encounter (Signed)
Ok, please schedule patient for barium esophagram. Advised him to complete a course of fluconazole. Schedule follow-up office visit in 1-2 months. Will plan for EGD towards end of December or January once it is safe for him to hold Plavix for 5 days. Thanks

## 2017-07-22 ENCOUNTER — Other Ambulatory Visit: Payer: Self-pay

## 2017-07-22 ENCOUNTER — Telehealth: Payer: Self-pay | Admitting: Gastroenterology

## 2017-07-22 MED ORDER — PANTOPRAZOLE SODIUM 40 MG PO TBEC: 40.0000 mg | DELAYED_RELEASE_TABLET | Freq: Two times a day (BID) | ORAL | 6 refills | Status: DC

## 2017-07-22 NOTE — Telephone Encounter (Signed)
Did the pharmacy ask for a prior authorization. I can help if needed. Thanks

## 2017-07-22 NOTE — Telephone Encounter (Signed)
I have not received anything saying that twice a day protonix needs a prior [place]

## 2017-07-22 NOTE — Telephone Encounter (Signed)
Noted  

## 2017-07-29 ENCOUNTER — Ambulatory Visit (HOSPITAL_COMMUNITY): Admission: RE | Admit: 2017-07-29 | Payer: Medicaid Other | Source: Ambulatory Visit

## 2017-08-04 ENCOUNTER — Other Ambulatory Visit: Payer: Self-pay

## 2017-08-04 ENCOUNTER — Telehealth: Payer: Self-pay | Admitting: Gastroenterology

## 2017-08-04 DIAGNOSIS — R1310 Dysphagia, unspecified: Secondary | ICD-10-CM

## 2017-08-04 NOTE — Telephone Encounter (Signed)
Missed his appointment on 07/29/17 for the DG esophagus. Rescheduled to 08/11/17 arrive at 10:45 am. Patient feels this will work well for him. Understands to call if he cannot make the appointment and it can be rescheduled.

## 2017-08-10 ENCOUNTER — Encounter: Payer: Medicaid Other | Admitting: Gastroenterology

## 2017-08-11 ENCOUNTER — Ambulatory Visit (HOSPITAL_COMMUNITY)
Admission: RE | Admit: 2017-08-11 | Discharge: 2017-08-11 | Disposition: A | Discharge status: Home / Self Care | Payer: Medicaid Other | Source: Ambulatory Visit | Attending: Gastroenterology | Admitting: Gastroenterology

## 2017-08-11 DIAGNOSIS — R1310 Dysphagia, unspecified: Secondary | ICD-10-CM | POA: Diagnosis present

## 2017-08-11 DIAGNOSIS — K229 Disease of esophagus, unspecified: Secondary | ICD-10-CM | POA: Insufficient documentation

## 2017-09-05 ENCOUNTER — Ambulatory Visit: Payer: Medicaid Other | Admitting: Family Medicine

## 2017-09-07 ENCOUNTER — Ambulatory Visit: Payer: Medicaid Other | Admitting: Family Medicine

## 2017-09-13 ENCOUNTER — Encounter: Payer: Self-pay | Admitting: Family Medicine

## 2017-09-13 ENCOUNTER — Ambulatory Visit: Payer: Medicaid Other | Attending: Family Medicine | Admitting: Family Medicine

## 2017-09-13 VITALS — BP 130/75 | HR 87 | Temp 98.1°F | Ht 73.0 in | Wt 267.6 lb

## 2017-09-13 DIAGNOSIS — Z21 Asymptomatic human immunodeficiency virus [HIV] infection status: Secondary | ICD-10-CM | POA: Diagnosis not present

## 2017-09-13 DIAGNOSIS — K219 Gastro-esophageal reflux disease without esophagitis: Secondary | ICD-10-CM | POA: Insufficient documentation

## 2017-09-13 DIAGNOSIS — M48061 Spinal stenosis, lumbar region without neurogenic claudication: Secondary | ICD-10-CM | POA: Insufficient documentation

## 2017-09-13 DIAGNOSIS — Z7902 Long term (current) use of antithrombotics/antiplatelets: Secondary | ICD-10-CM | POA: Insufficient documentation

## 2017-09-13 DIAGNOSIS — M47816 Spondylosis without myelopathy or radiculopathy, lumbar region: Secondary | ICD-10-CM | POA: Insufficient documentation

## 2017-09-13 DIAGNOSIS — Z85828 Personal history of other malignant neoplasm of skin: Secondary | ICD-10-CM | POA: Diagnosis not present

## 2017-09-13 DIAGNOSIS — Z79899 Other long term (current) drug therapy: Secondary | ICD-10-CM | POA: Diagnosis not present

## 2017-09-13 DIAGNOSIS — M545 Low back pain: Secondary | ICD-10-CM | POA: Insufficient documentation

## 2017-09-13 DIAGNOSIS — I2510 Atherosclerotic heart disease of native coronary artery without angina pectoris: Secondary | ICD-10-CM | POA: Diagnosis not present

## 2017-09-13 DIAGNOSIS — I10 Essential (primary) hypertension: Secondary | ICD-10-CM | POA: Diagnosis not present

## 2017-09-13 DIAGNOSIS — J45909 Unspecified asthma, uncomplicated: Secondary | ICD-10-CM | POA: Insufficient documentation

## 2017-09-13 DIAGNOSIS — Z955 Presence of coronary angioplasty implant and graft: Secondary | ICD-10-CM | POA: Insufficient documentation

## 2017-09-13 DIAGNOSIS — G8929 Other chronic pain: Secondary | ICD-10-CM | POA: Insufficient documentation

## 2017-09-13 MED ORDER — VOLTAREN 1 % TD GEL: 4.0000 g | Freq: Four times a day (QID) | TRANSDERMAL | 3 refills | Status: DC

## 2017-09-13 MED ORDER — LIDOCAINE 5 % EX PTCH: 1.0000 | MEDICATED_PATCH | CUTANEOUS | 2 refills | Status: DC

## 2017-09-13 NOTE — Progress Notes (Signed)
Subjective:  Patient ID: Benjamin Herrera, male    DOB: [birth year]  Age: 51 y.o. MRN: [id number]  CC: Hypertension and Back Pain   HPI Benjamin Herrera is a 51 year old male with a history of HIV,  hypertension, coronary artery disease (status post stent), chronic low back pain for the last 3 years to presents today for follow-up of low back pain which he states has worsened lately and prevents him from standing for greater than 4 minutes. Denies radiation of pain down his extremities and he has no numbness.  He currently takes Flexeril and gabapentin with no relief in symptoms. MRI from 03/2017 revealed L4-L5, L5-S1 annular bulge, mild facet arthropathy and ligamentum flavum hypertrophy, mild stenosis, no definite impingement; impression Minor lumbar spondylosis.  He denies loss of sphincteric function, recent falls and does not ambulate with the aid of a cane. He requests renewal of a handicap form for placard.  Past Medical History:  Diagnosis Date  . Achalasia and cardiospasm   . Allergy   . Arthritis    back ,shoulders   . Asthma   . Boils    under arms hx of  . CAD (coronary artery disease)    a. 09/2016: cath showing 100% RCA occlusion with collaterals and 90% OM1 (treated with DES)  . Candidiasis of mouth   . Candidiasis of unspecified site   . Cholelithiasis   . Cocaine abuse ([place])    Quit 09/2016  . Condyloma acuminatum in male of scrotum & anal canal s/p laser ablation 07/03/2012  . Coronary artery disease   . Dysphagia   . Fracture, humerus   . GERD (gastroesophageal reflux disease)   . Heart attack ([place])   . Hemorrhoids   . Hepatitis 1993   A  . Herpes labialis   . HIV (human immunodeficiency virus infection) ([place]) [place]  . Hypertension Dx 2014   off all bp meds for last 9 or 10 months  . Pilonidal disease 01/10/2012  . Polysubstance abuse ([place])   . Psoriasis    hx of  . Sleep apnea    does  have CPAP  . 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 BALLOON DILATION N/A 11/11/2015   Procedure: BALLOON DILATION;  Surgeon: Mauri Pole, MD;  Location: [facility];  Service: Endoscopy;  Laterality: N/A;  . BOTOX INJECTION  10/11/2012   Procedure: BOTOX INJECTION;  Surgeon: Inda Castle, MD;  Location: [facility];  Service: Endoscopy;  Laterality: N/A;  . BOTOX INJECTION N/A 01/25/2013   Procedure: BOTOX INJECTION;  Surgeon: Inda Castle, MD;  Location: [facility];  Service: Endoscopy;  Laterality: N/A;  . BOTOX INJECTION N/A 06/21/2013   Procedure: BOTOX INJECTION;  Surgeon: Inda Castle, MD;  Location: [facility];  Service: Endoscopy;  Laterality: N/A;  . BOTOX INJECTION N/A 10/08/2013   Procedure: BOTOX INJECTION;  Surgeon: Inda Castle, MD;  [facility]: [facility];  Service: Endoscopy;  Laterality: N/A;  . BOTOX INJECTION N/A 06/16/2015   Procedure: BOTOX INJECTION;  Surgeon: Inda Castle, MD;  Location: [facility];  Service: Endoscopy;  Laterality: N/A;  . CARDIAC CATHETERIZATION N/A 10/11/2016   Procedure: Left Heart Cath and Coronary Angiography;  Surgeon: Burnell Blanks, MD;  mRCA 100% w/ L>R collaterals, OM1 90%  . CARDIAC CATHETERIZATION  N/A 10/11/2016   Procedure: Coronary Stent Intervention;  Surgeon: Burnell Blanks, MD;  Hinda Lenis 0.3E09 DES OM1  . COLONOSCOPY  05/19/2012   Procedure: COLONOSCOPY;  Surgeon: Inda Castle, MD;  [facility]: [facility];  Service: Endoscopy;  Laterality: N/A;  jill trying to contact pt to come in 0830 for 930 case, phone not accepting messages  . COLONOSCOPY    . ESOPHAGEAL MANOMETRY  05/29/2012   Procedure: ESOPHAGEAL MANOMETRY (EM);  Surgeon: Inda Castle, MD;  [facility]: [facility];  Service: Endoscopy;  Laterality: N/A;  . ESOPHAGEAL MANOMETRY N/A 10/06/2015   Procedure: ESOPHAGEAL  MANOMETRY (EM);  Surgeon: Mauri Pole, MD;  [facility]: [facility];  Service: Endoscopy;  Laterality: N/A;  . ESOPHAGOGASTRODUODENOSCOPY  11/11/2011   Procedure: ESOPHAGOGASTRODUODENOSCOPY (EGD);  Surgeon: Inda Castle, MD;  Location: [facility];  Service: Endoscopy;  Laterality: N/A;  botox injection  called Pt to change time of procedure per Dr Deatra Ina  . ESOPHAGOGASTRODUODENOSCOPY  03/10/2012   Procedure: ESOPHAGOGASTRODUODENOSCOPY (EGD);  Surgeon: Inda Castle, MD;  Location: [facility];  Service: Endoscopy;  Laterality: N/A;  . ESOPHAGOGASTRODUODENOSCOPY  10/11/2012   Procedure: ESOPHAGOGASTRODUODENOSCOPY (EGD);  Surgeon: Inda Castle, MD;  Location: [facility];  Service: Endoscopy;  Laterality: N/A;  . ESOPHAGOGASTRODUODENOSCOPY N/A 01/25/2013   Procedure: ESOPHAGOGASTRODUODENOSCOPY (EGD);  Surgeon: Inda Castle, MD;  Location: [facility];  Service: Endoscopy;  Laterality: N/A;  . ESOPHAGOGASTRODUODENOSCOPY N/A 06/21/2013   Procedure: ESOPHAGOGASTRODUODENOSCOPY (EGD);  Surgeon: Inda Castle, MD;  Location: [facility];  Service: Endoscopy;  Laterality: N/A;  . ESOPHAGOGASTRODUODENOSCOPY (EGD) WITH PROPOFOL N/A 10/08/2013   Procedure: ESOPHAGOGASTRODUODENOSCOPY (EGD) WITH PROPOFOL;  Surgeon: Inda Castle, MD;  [facility];  Service: Endoscopy;  Laterality: N/A;  . ESOPHAGOGASTRODUODENOSCOPY (EGD) WITH PROPOFOL N/A 06/16/2015   Procedure: ESOPHAGOGASTRODUODENOSCOPY (EGD) WITH PROPOFOL;  Surgeon: Inda Castle, MD;  [facility];  Service: Endoscopy;  Laterality: N/A;  . ESOPHAGOGASTRODUODENOSCOPY (EGD) WITH PROPOFOL N/A 11/11/2015   Procedure: ESOPHAGOGASTRODUODENOSCOPY (EGD) WITH PROPOFOL;  Surgeon: Mauri Pole, MD;  [facility];  Service: Endoscopy;  Laterality: N/A;  . EXAMINATION UNDER ANESTHESIA  07/24/2012   Procedure: EXAM UNDER ANESTHESIA;  Surgeon: Adin Hector, MD;  Location: [facility];  Service: General;  Laterality:  N/A;  . humeral fracture surgery Right yrs ago  . PILONIDAL CYST EXCISION  07/24/2012   Procedure: CYST EXCISION PILONIDAL SIMPLE;  Surgeon: Adin Hector, MD;  Location: [facility];  Service: General;  Laterality: N/A;  Exam Under Anesthesia,, Excision Pilonidal Disease,   . right shoulder replacement  2008   x 2  . UPPER GASTROINTESTINAL ENDOSCOPY    . WART FULGURATION  07/24/2012   Procedure: FULGURATION ANAL WART;  Surgeon: Adin Hector, MD;  Location: [facility];  Service: General;  Laterality: N/A;  excision of raphe mass    Allergies  Allergen Reactions  . Citrus Swelling    Mouth itching and swelling      Outpatient Medications Prior to Visit  Medication Sig Dispense Refill  . albuterol (PROVENTIL HFA;VENTOLIN HFA) 108 (90 Base) MCG/ACT inhaler Inhale 2 puffs into the lungs every 6 (six) hours as needed for wheezing or shortness of breath. 1 Inhaler 2  . alum & mag hydroxide-simeth (MAALOX PLUS) 400-400-40 MG/5ML suspension Take 5 mLs by mouth every 6 (six) hours as needed for indigestion. 355 mL 0  . amLODipine (NORVASC) 2.5 MG tablet Take 1 tablet (2.5 mg total) by mouth daily. 90 tablet 3  .  aspirin 81 MG chewable tablet Chew 1 tablet (81 mg total) by mouth daily. 90 tablet 3  . clopidogrel (PLAVIX) 75 MG tablet Take 1 tablet (75 mg total) by mouth daily with breakfast. 90 tablet 3  . cyclobenzaprine (FLEXERIL) 10 MG tablet Take 1 tablet (10 mg total) by mouth 2 (two) times daily as needed for muscle spasms. 50 tablet 1  . GENVOYA 150-150-200-10 MG TABS tablet TAKE 1 TABLET BY MOUTH DAILY WITH BREAKFAST. 90 tablet 0  . metoprolol succinate (TOPROL XL) 25 MG 24 hr tablet Take 0.5 tablets (12.5 mg total) by mouth daily. 45 tablet 3  . Naphazoline-Pheniramine (OPCON-A OP) Apply 1-2 drops to eye 2 (two) times daily as needed (dry eyes.).     Marland Kitchen pantoprazole (PROTONIX) 40 MG tablet Take 1 tablet (40 mg total) by mouth 2 (two) times daily before a meal. 60 tablet 6  . PREZISTA 800 MG  tablet TAKE 1 TABLET (800 MG TOTAL) BY MOUTH DAILY WITH BREAKFAST. 90 tablet 0  . VOLTAREN 1 % GEL Apply 4 g topically 4 (four) times daily. 100 g 3  . fluconazole (DIFLUCAN) 100 MG tablet Take 1 tablet (100 mg total) by mouth daily. (Patient not taking: Reported on 09/13/2017) 7 tablet 0  . gabapentin (NEURONTIN) 600 MG tablet Take 1 tablet (600 mg total) by mouth at bedtime. (Patient not taking: Reported on 09/13/2017) 30 tablet 3  . nitroGLYCERIN (NITROSTAT) 0.4 MG SL tablet Place 1 tablet (0.4 mg total) under the tongue every 5 (five) minutes as needed for chest pain. 90 tablet 3  . rosuvastatin (CRESTOR) 40 MG tablet Take 1 tablet (40 mg total) by mouth daily. 90 tablet 1   No facility-administered medications prior to visit.     ROS Review of Systems  Constitutional: Negative for activity change and appetite change.  HENT: Negative for sinus pressure and sore throat.   Eyes: Negative for visual disturbance.  Respiratory: Negative for cough, chest tightness and shortness of breath.   Cardiovascular: Negative for chest pain and leg swelling.  Gastrointestinal: Negative for abdominal distention, abdominal pain, constipation and diarrhea.  Endocrine: Negative.   Genitourinary: Negative for dysuria.  Musculoskeletal: Positive for back pain. Negative for joint swelling and myalgias.  Skin: Negative for rash.  Allergic/Immunologic: Negative.   Neurological: Negative for weakness, light-headedness and numbness.  Psychiatric/Behavioral: Negative for dysphoric mood and suicidal ideas.    Objective:  BP 130/75   Pulse 87   Temp 98.1 F (36.7 C) (Oral)   Ht 6\' 1"  (1.854 m)   Wt 267 lb 9.6 oz (121.4 kg)   SpO2 97%   BMI 35.31 kg/m   BP/Weight 09/13/2017 07/19/2017 6/83/4196  Systolic BP 222 979 892  Diastolic BP 75 78 72  Wt. (Lbs) 267.6 259.6 266  BMI 35.31 34.25 35.09      Physical Exam  Constitutional: He is oriented to person, place, and time. He appears well-developed and  well-nourished.  Cardiovascular: Normal rate, normal heart sounds and intact distal pulses.  No murmur heard. Pulmonary/Chest: Effort normal and breath sounds normal. He has no wheezes. He has no rales. He exhibits no tenderness.  Abdominal: Soft. Bowel sounds are normal. He exhibits no distension and no mass. There is no tenderness.  Musculoskeletal: Normal range of motion.  Midline tenderness of lumbar spine; negative straight leg raise  Neurological: He is alert and oriented to person, place, and time.   EXAM: MRI LUMBAR SPINE WITHOUT CONTRAST  TECHNIQUE: Multiplanar, multisequence MR imaging of  the lumbar spine was performed. No intravenous contrast was administered.  COMPARISON:  None.  FINDINGS: Segmentation:  Standard  Alignment:  Physiologic.  Vertebrae:  No fracture, evidence of discitis, or bone lesion.  Conus medullaris: Extends to the L1 level and appears normal.  Paraspinal and other soft tissues: Unremarkable.  Disc levels:  L1-L2:  Normal.  L2-L3:  Normal disc space.  Mild facet arthropathy.  No impingement.  L3-L4:  Normal disc space.  Mild facet arthropathy.  No impingement.  L4-L5: Annular bulge. Mild facet arthropathy and ligamentum flavum hypertrophy. Mild stenosis. No definite impingement.  L5-S1:  Annular bulge.  Mild facet arthropathy.  No impingement.  IMPRESSION: Minor lumbar spondylosis.  No compressive lesion is identified.   Electronically Signed   By: Staci Righter M.D.   On: 04/15/2017 18:18  Assessment & Plan:   1. Lumbar spondylosis Uncontrolled on gabapentin and Flexeril Not able to use oral NSAIDs due to the fact that he is on Plavix Consider referral for epidural spinal injections if symptoms do not resolve-he is undecided at this time - Ambulatory referral to Physical Therapy - lidocaine (LIDODERM) 5 %; Place 1 patch onto the skin daily. Remove & Discard patch within 12 hours or as directed by MD  Dispense:  30 patch; Refill: 2 - VOLTAREN 1 % GEL; Apply 4 g topically 4 (four) times daily.  Dispense: 100 g; Refill: 3   Meds ordered this encounter  Medications  . lidocaine (LIDODERM) 5 %    Sig: Place 1 patch onto the skin daily. Remove & Discard patch within 12 hours or as directed by MD    Dispense:  30 patch    Refill:  2  . VOLTAREN 1 % GEL    Sig: Apply 4 g topically 4 (four) times daily.    Dispense:  100 g    Refill:  3    Follow-up: Return in about 6 weeks (around 10/25/2017) for follow up of back pain.   Arnoldo Morale MD

## 2017-09-13 NOTE — Patient Instructions (Signed)

## 2017-09-14 ENCOUNTER — Encounter: Payer: Self-pay | Admitting: Pharmacist

## 2017-09-14 NOTE — Progress Notes (Signed)
Prior authorization completed and approved for Lidocaine patches. Prior Approval #: [id number]  PA not needed for Voltaren Gel (will fax pharmacy to run under brand because generic is not covered)

## 2017-09-22 ENCOUNTER — Ambulatory Visit: Payer: Medicaid Other | Attending: Family Medicine | Admitting: Rehabilitation

## 2017-10-03 ENCOUNTER — Ambulatory Visit: Payer: Medicaid Other | Admitting: Internal Medicine

## 2017-10-05 ENCOUNTER — Other Ambulatory Visit: Payer: Self-pay | Admitting: Infectious Diseases

## 2017-10-05 DIAGNOSIS — B20 Human immunodeficiency virus [HIV] disease: Secondary | ICD-10-CM

## 2017-10-06 ENCOUNTER — Ambulatory Visit: Payer: Medicaid Other | Admitting: Internal Medicine

## 2017-10-06 ENCOUNTER — Encounter: Payer: Self-pay | Admitting: Internal Medicine

## 2017-10-06 VITALS — BP 122/64 | HR 66 | Ht 73.0 in | Wt 264.2 lb

## 2017-10-06 DIAGNOSIS — I10 Essential (primary) hypertension: Secondary | ICD-10-CM | POA: Diagnosis not present

## 2017-10-06 DIAGNOSIS — E785 Hyperlipidemia, unspecified: Secondary | ICD-10-CM | POA: Diagnosis not present

## 2017-10-06 DIAGNOSIS — I25119 Atherosclerotic heart disease of native coronary artery with unspecified angina pectoris: Secondary | ICD-10-CM | POA: Diagnosis not present

## 2017-10-06 DIAGNOSIS — B20 Human immunodeficiency virus [HIV] disease: Secondary | ICD-10-CM

## 2017-10-06 NOTE — Progress Notes (Signed)
OFFICE NOTE  Chief Complaint:  Follow-up  Primary Care Physician: Arnoldo Morale, MD  HPI:  Benjamin Herrera is a 51 y.o. male with a past medial history significant for a number of cardiac risk factors, who presents with upper mid-epigastric and central chest pain. Some of the episodes were worse after smoking cigarettes. He has a history of cocaine use and achalasia as well as biliary colic in the past. Troponins have been negative. EKG shows some new T wave inversions. There is a family history of early onset CAD in his mother. Based on this history I recommended a stress test. The stress test indicated a reduced LVEF of 47% with some inferior changes which were read as attenuation but concerning for ischemia. Cardiac catheterization was then recommended. He underwent left heart catheterization on 10/11/2016. This demonstrated severe two-vessel coronary disease with 100% mid to distal RCA occlusion and left to right Collaterals, as well as a 90% OM1 lesion which was successfully stented with a resolute Onyx 3 0 x 15 mm DES. He was placed on aspirin and Plavix and rosuvastatin 20 was started because of his concomitant use of a protease inhibitor. Since discharge she is doing very well. He saw Rosaria Ferries, PA-C in follow-up in December and was not having any chest pain. Hypertension was well controlled. He was not on beta blocker. Today he reports that he continues to feel well and denies any chest pain or worsening shortness of breath. Blood pressure is well-controlled 118/64.  02/24/2017  Mr. Boggus returns today for follow-up. We readjusted his medications last time and his blood pressure is much better today 120/72. Overall he feels well denies any chest pain or worsening shortness of breath. His stent was placed in December 2017 and would need to remain on dual antiplatelet therapy at least until December 2018. He is on Crestor which is not thought to interact with his protease  inhibitors.  10/06/2017  Mr. Gasior returns today for follow-up.  He saw Bernerd Pho, PA-C in August 2018, at the time he was noted to have an elevated total cholesterol with LDL not at goal of less than 70.  His Crestor was increased to 40 mg daily.  Lead pressure was reportedly well controlled and appears to be well controlled today at 122/64.  He denies any recurrent chest pain.  It has been close to 1 year since his stent in the OM 1.  PMHx:  Past Medical History:  Diagnosis Date  . Achalasia and cardiospasm   . Allergy   . Arthritis    back ,shoulders   . Asthma   . Boils    under arms hx of  . CAD (coronary artery disease)    a. 09/2016: cath showing 100% RCA occlusion with collaterals and 90% OM1 (treated with DES)  . Candidiasis of mouth   . Candidiasis of unspecified site   . Cholelithiasis   . Cocaine abuse ([place])    Quit 09/2016  . Condyloma acuminatum in male of scrotum & anal canal s/p laser ablation 07/03/2012  . Coronary artery disease   . Dysphagia   . Fracture, humerus   . GERD (gastroesophageal reflux disease)   . Heart attack ([place])   . Hemorrhoids   . Hepatitis 1993   A  . Herpes labialis   . HIV (human immunodeficiency virus infection) ([place]) [place]  . Hypertension Dx 2014   off all bp meds for last 9 or 10 months  . Pilonidal disease  01/10/2012  . Polysubstance abuse ([employer])   . Psoriasis    hx of  . Sleep apnea    does  have CPAP  . 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 BALLOON DILATION N/A 11/11/2015   Procedure: BALLOON DILATION;  Surgeon: Mauri Pole, MD;  [facility];  Service: Endoscopy;  Laterality: N/A;  . BOTOX INJECTION  10/11/2012   Procedure: BOTOX INJECTION;  Surgeon: Inda Castle, MD;  [facility];  Service: Endoscopy;   Laterality: N/A;  . BOTOX INJECTION N/A 01/25/2013   Procedure: BOTOX INJECTION;  Surgeon: Inda Castle, MD;  [facility];  Service: Endoscopy;  Laterality: N/A;  . BOTOX INJECTION N/A 06/21/2013   Procedure: BOTOX INJECTION;  Surgeon: Inda Castle, MD;  [facility];  Service: Endoscopy;  Laterality: N/A;  . BOTOX INJECTION N/A 10/08/2013   Procedure: BOTOX INJECTION;  Surgeon: Inda Castle, MD;  [facility];  Service: Endoscopy;  Laterality: N/A;  . BOTOX INJECTION N/A 06/16/2015   Procedure: BOTOX INJECTION;  Surgeon: Inda Castle, MD;  [facility];  Service: Endoscopy;  Laterality: N/A;  . CARDIAC CATHETERIZATION N/A 10/11/2016   Procedure: Left Heart Cath and Coronary Angiography;  Surgeon: Burnell Blanks, MD;  mRCA 100% w/ L>R collaterals, OM1 90%  . CARDIAC CATHETERIZATION N/A 10/11/2016   Procedure: Coronary Stent Intervention;  Surgeon: Burnell Blanks, MD;  Hinda Lenis 5.5D32 DES OM1  . COLONOSCOPY  05/19/2012   Procedure: COLONOSCOPY;  Surgeon: Inda Castle, MD;  [facility];  Service: Endoscopy;  Laterality: N/A;  jill trying to contact pt to come in 0830 for 930 case, phone not accepting messages  . COLONOSCOPY    . ESOPHAGEAL MANOMETRY  05/29/2012   Procedure: ESOPHAGEAL MANOMETRY (EM);  Surgeon: Inda Castle, MD;  [facility];  Service: Endoscopy;  Laterality: N/A;  . ESOPHAGEAL MANOMETRY N/A 10/06/2015   Procedure: ESOPHAGEAL MANOMETRY (EM);  Surgeon: Mauri Pole, MD;  [facility];  Service: Endoscopy;  Laterality: N/A;  . ESOPHAGOGASTRODUODENOSCOPY  11/11/2011   Procedure: ESOPHAGOGASTRODUODENOSCOPY (EGD);  Surgeon: Inda Castle, MD;  [facility];  Service: Endoscopy;  Laterality: N/A;  botox injection  called Pt to change time of procedure per Dr Deatra Ina  . ESOPHAGOGASTRODUODENOSCOPY  03/10/2012   Procedure: ESOPHAGOGASTRODUODENOSCOPY (EGD);  Surgeon:  Inda Castle, MD;  Location: [facility];  Service: Endoscopy;  Laterality: N/A;  . ESOPHAGOGASTRODUODENOSCOPY  10/11/2012   Procedure: ESOPHAGOGASTRODUODENOSCOPY (EGD);  Surgeon: Inda Castle, MD;  Location: [facility];  Service: Endoscopy;  Laterality: N/A;  . ESOPHAGOGASTRODUODENOSCOPY N/A 01/25/2013   Procedure: ESOPHAGOGASTRODUODENOSCOPY (EGD);  Surgeon: Inda Castle, MD;  Location: [facility];  Service: Endoscopy;  Laterality: N/A;  . ESOPHAGOGASTRODUODENOSCOPY N/A 06/21/2013   Procedure: ESOPHAGOGASTRODUODENOSCOPY (EGD);  Surgeon: Inda Castle, MD;  Location[facility];  Service: Endoscopy;  Laterality: N/A;  . ESOPHAGOGASTRODUODENOSCOPY (EGD) WITH PROPOFOL N/A 10/08/2013   Procedure: ESOPHAGOGASTRODUODENOSCOPY (EGD) WITH PROPOFOL;  Surgeon: Inda Castle, MD;  [facility];  Service: Endoscopy;  Laterality: N/A;  . ESOPHAGOGASTRODUODENOSCOPY (EGD) WITH PROPOFOL N/A 06/16/2015   Procedure: ESOPHAGOGASTRODUODENOSCOPY (EGD) WITH PROPOFOL;  Surgeon: Inda Castle, MD;  [facility];  Service: Endoscopy;  Laterality: N/A;  . ESOPHAGOGASTRODUODENOSCOPY (EGD) WITH PROPOFOL N/A 11/11/2015  Procedure: ESOPHAGOGASTRODUODENOSCOPY (EGD) WITH PROPOFOL;  Surgeon: Mauri Pole, MD;  [facility];  Service: Endoscopy;  Laterality: N/A;  . EXAMINATION UNDER ANESTHESIA  07/24/2012   Procedure: EXAM UNDER ANESTHESIA;  Surgeon: Adin Hector, MD;  [facility];  Service: General;  Laterality: N/A;  . humeral fracture surgery Right yrs ago  . PILONIDAL CYST EXCISION  07/24/2012   Procedure: CYST EXCISION PILONIDAL SIMPLE;  Surgeon: Adin Hector, MD;  [facility];  Service: General;  Laterality: N/A;  Exam Under Anesthesia,, Excision Pilonidal Disease,   . right shoulder replacement  2008   x 2  . UPPER GASTROINTESTINAL ENDOSCOPY    . WART FULGURATION  07/24/2012   Procedure: FULGURATION ANAL WART;  Surgeon: Adin Hector, MD;  [facility];  Service: General;  Laterality: N/A;  excision of raphe mass    FAMHx:  Family History  Problem Relation Age of Onset  . Arthritis Mother   . Hypertension Mother   . Heart disease Mother        MI age 30  . Diabetes Maternal Grandmother   . Stomach cancer Paternal Grandmother   . Colon cancer Neg Hx   . Colon polyps Neg Hx   . Esophageal cancer Neg Hx   . Rectal cancer Neg Hx     SOCHx:   reports that he quit smoking about 22 months ago. His smoking use included cigarettes. He has a 12.50 pack-year smoking history. he has never used smokeless tobacco. He reports that he uses drugs. Drugs: Marijuana and Cocaine. Frequency: 3.00 times per week. He reports that he does not drink alcohol.  ALLERGIES:  Allergies  Allergen Reactions  . Citrus Swelling    Mouth itching and swelling     ROS: Pertinent items noted in HPI and remainder of comprehensive ROS otherwise negative.  HOME MEDS: Current Outpatient Medications on File Prior to Visit  Medication Sig Dispense Refill  . albuterol (PROVENTIL HFA;VENTOLIN HFA) 108 (90 Base) MCG/ACT inhaler Inhale 2 puffs into the lungs every 6 (six) hours as needed for wheezing or shortness of breath. 1 Inhaler 2  . amLODipine (NORVASC) 2.5 MG tablet Take 1 tablet (2.5 mg total) by mouth daily. 90 tablet 3  . aspirin 81 MG chewable tablet Chew 1 tablet (81 mg total) by mouth daily. 90 tablet 3  . clopidogrel (PLAVIX) 75 MG tablet Take 1 tablet (75 mg total) by mouth daily with breakfast. (Patient taking differently: Take 75 mg by mouth daily with breakfast. STOP after 10/11/17 per Dr. Debara Pickett) 90 tablet 3  . cyclobenzaprine (FLEXERIL) 10 MG tablet Take 1 tablet (10 mg total) by mouth 2 (two) times daily as needed for muscle spasms. 50 tablet 1  . gabapentin (NEURONTIN) 600 MG tablet Take 1 tablet (600 mg total) by mouth at bedtime. 30 tablet 3  . GENVOYA 150-150-200-10 MG TABS tablet TAKE 1 TABLET BY MOUTH DAILY WITH BREAKFAST. 90 tablet 0  .  metoprolol succinate (TOPROL XL) 25 MG 24 hr tablet Take 0.5 tablets (12.5 mg total) by mouth daily. 45 tablet 3  . Naphazoline-Pheniramine (OPCON-A OP) Apply 1-2 drops to eye 2 (two) times daily as needed (dry eyes.).     Marland Kitchen nitroGLYCERIN (NITROSTAT) 0.4 MG SL tablet Place 0.4 mg under the tongue every 5 (five) minutes as needed for chest pain.    . pantoprazole (PROTONIX) 40 MG tablet Take 1 tablet (40 mg total) by mouth 2 (two) times daily before a meal. 60  tablet 6  . PREZISTA 800 MG tablet TAKE 1 TABLET (800 MG TOTAL) BY MOUTH DAILY WITH BREAKFAST. 90 tablet 0  . VOLTAREN 1 % GEL Apply 4 g topically 4 (four) times daily. 100 g 3  . rosuvastatin (CRESTOR) 40 MG tablet Take 1 tablet (40 mg total) by mouth daily. 90 tablet 1   No current facility-administered medications on file prior to visit.     LABS/IMAGING: No results found for this or any previous visit (from the past 48 hour(s)). No results found.  WEIGHTS: Wt Readings from Last 3 Encounters:  10/06/17 264 lb 3.2 oz (119.8 kg)  09/13/17 267 lb 9.6 oz (121.4 kg)  07/19/17 259 lb 9.6 oz (117.8 kg)    VITALS: BP 122/64   Pulse 66   Ht 6\' 1"  (1.854 m)   Wt 264 lb 3.2 oz (119.8 kg)   BMI 34.86 kg/m   EXAM: General appearance: alert and no distress Neck: no carotid bruit, no JVD and thyroid not enlarged, symmetric, no tenderness/mass/nodules Lungs: clear to auscultation bilaterally Heart: regular rate and rhythm, S1, S2 normal, no murmur, click, rub or gallop Abdomen: soft, non-tender; bowel sounds normal; no masses,  no organomegaly Extremities: extremities normal, atraumatic, no cyanosis or edema Pulses: 2+ and symmetric Skin: Skin color, texture, turgor normal. No rashes or lesions Neurologic: Grossly normal Psych: Pleasant  EKG: Normal sinus rhythm at 66-personally reviewed  ASSESSMENT: 1. CAD with occluded mid to distal RCA and left-to-right collaterals and status post PCI to OM1 with a 3.015 mm resolute onyx DES  (09/2016) 2. Hypertension 3. Dyslipidemia 4. HIV- on protease inhibitors 5. OSA on CPAP  PLAN: 1.   Mr. Loadholt seems to be doing well without recurrent anginal symptoms.  He is now close to 1 year since his drug-eluting stent placement.  I advised him that he can discontinue Plavix as of 10/11/2017.  His blood pressure is well controlled.  Cholesterol was higher than optimal and he is rosuvastatin was increased to 40 mg daily.  We will recheck a lipid profile and hopefully he is at goal.  He should continue CPAP for his sleep apnea.  Plan follow-up with me annually or sooner as necessary.  Pixie Casino, MD, [facility], [place] Director of the [facility]  [facility] Attending Cardiologist  Direct Dial: [phone]  Fax: [phone]  Website:  [email or web address] 10/06/2017, 5:29 PM

## 2017-10-06 NOTE — Patient Instructions (Addendum)
Dr. Debara Pickett recommends that you STOP Plavix after December 18  Your physician recommends that you return for lab work FASTING (lipid, liver)  Your physician wants you to follow-up in: ONE YEAR with Dr. Debara Pickett. You will receive a reminder letter in the mail two months in advance. If you don't receive a letter, please call our office to schedule the follow-up appointment.

## 2017-10-25 LAB — HEPATIC FUNCTION PANEL
ALBUMIN: 4.2 g/dL (ref 3.5–5.5)
ALT: 16 IU/L (ref 0–44)
AST: 16 IU/L (ref 0–40)
Alkaline Phosphatase: 88 IU/L (ref 39–117)
BILIRUBIN TOTAL: 0.3 mg/dL (ref 0.0–1.2)
BILIRUBIN, DIRECT: 0.1 mg/dL (ref 0.00–0.40)
TOTAL PROTEIN: 7.1 g/dL (ref 6.0–8.5)

## 2017-10-25 LAB — LIPID PANEL
CHOL/HDL RATIO: 3.6 ratio (ref 0.0–5.0)
Cholesterol, Total: 127 mg/dL (ref 100–199)
HDL: 35 mg/dL — ABNORMAL LOW (ref >39)
LDL CALC: 78 mg/dL (ref 0–99)
TRIGLYCERIDES: 71 mg/dL (ref 0–149)
VLDL Cholesterol Cal: 14 mg/dL (ref 5–40)

## 2017-10-26 ENCOUNTER — Ambulatory Visit: Payer: Medicaid Other | Admitting: Family Medicine

## 2017-10-28 ENCOUNTER — Other Ambulatory Visit: Payer: Self-pay

## 2017-12-08 ENCOUNTER — Other Ambulatory Visit: Payer: Self-pay | Admitting: Student

## 2017-12-08 NOTE — Telephone Encounter (Signed)
REFILL 

## 2017-12-10 IMAGING — MR MR LUMBAR SPINE W/O CM
4 of 5 series · 19 of 48 positions shown · non-contrast
Comparison: None.

CLINICAL DATA: Low back pain. RIGHT leg pain. Worse with standing.

EXAM:
MRI LUMBAR SPINE WITHOUT CONTRAST
TECHNIQUE: Multiplanar, multisequence MR imaging of the lumbar spine was
performed. No intravenous contrast was administered.

[Series 3: T2 · sagittal · 4.0mm · 0.55mm/px · 6 of 15 slices shown (1 of 2)]
[im 1/15]
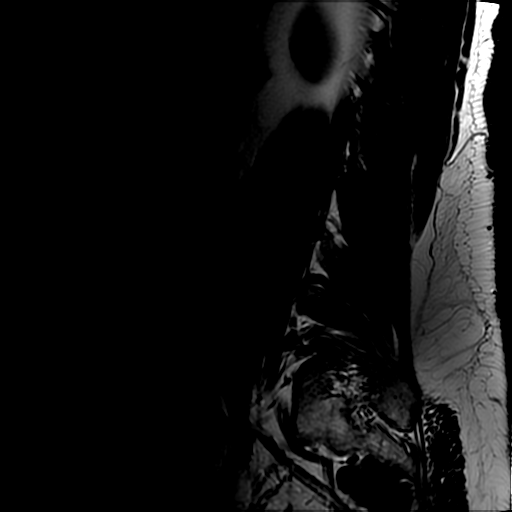
[im 3/15]
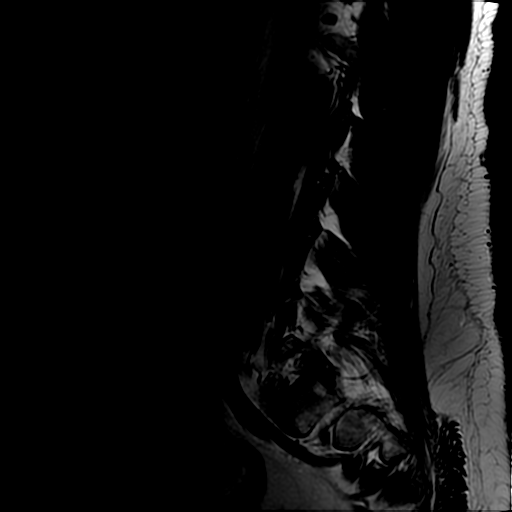
[im 6/15]
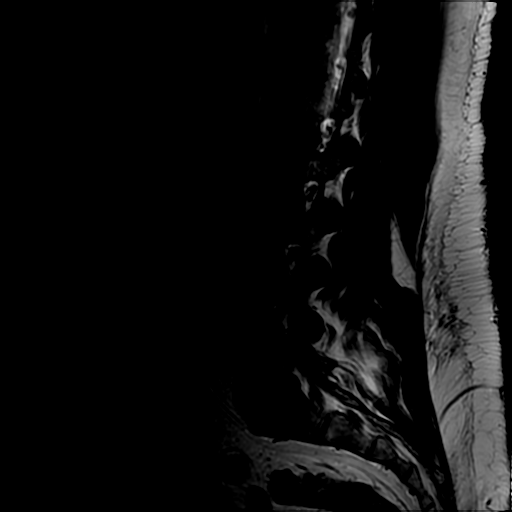
[im 9/15]
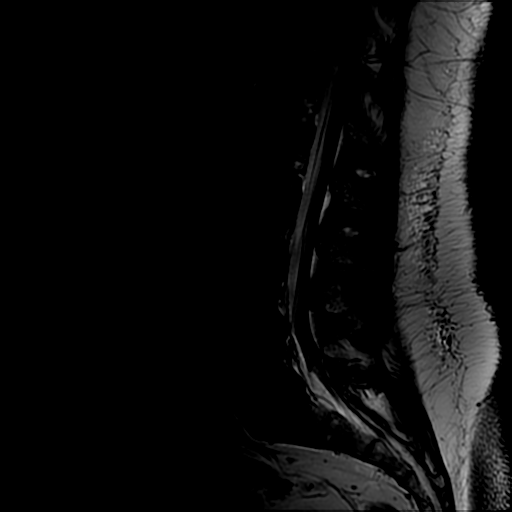
[im 12/15]
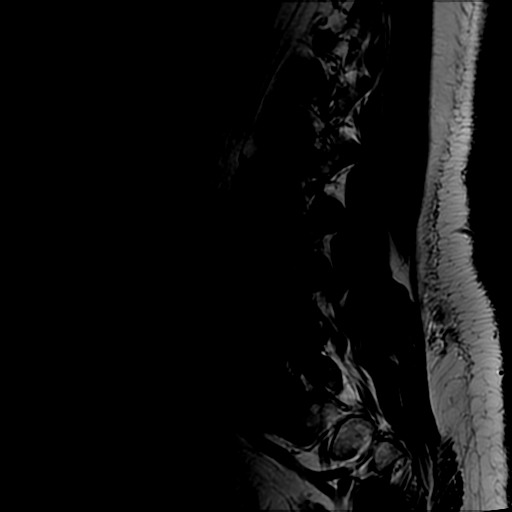
[im 15/15]
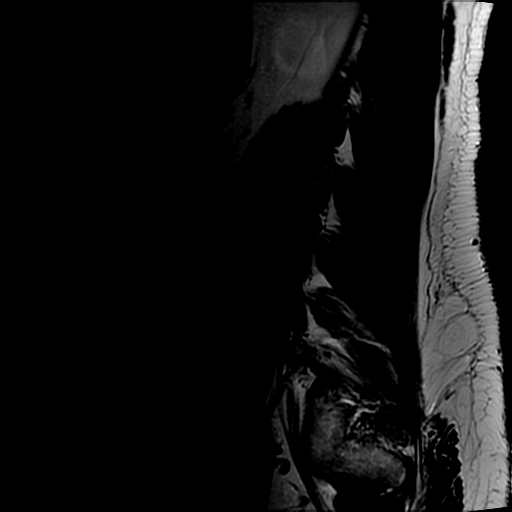

[Series 5: T1 · sagittal · 4.0mm · 0.55mm/px · 3 of 15 slices shown (1 of 2)]
[im 3/15]
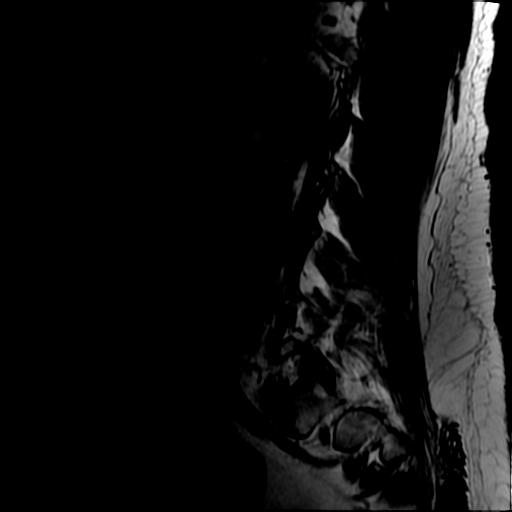
[im 9/15]
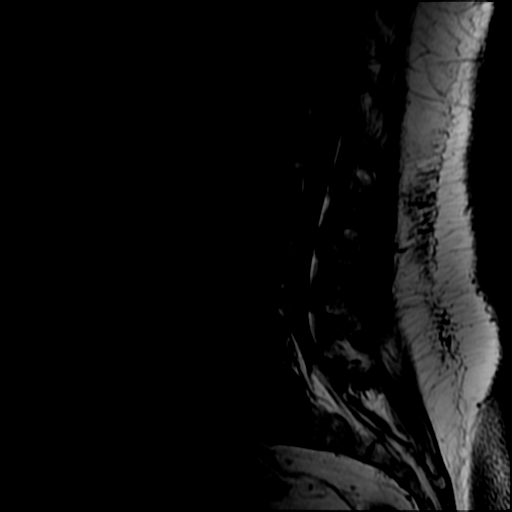
[im 15/15]
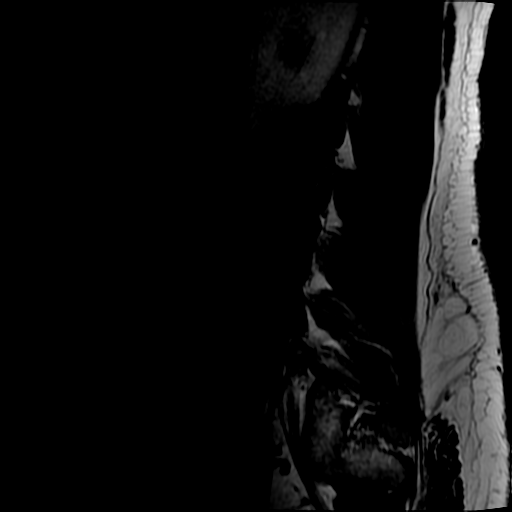

[Series 6: T2 · axial · 4.0mm · 0.39mm/px · z∈[-104,+70]mm · 7 of 37 slices shown (2 of 2)]
[im 1/37]
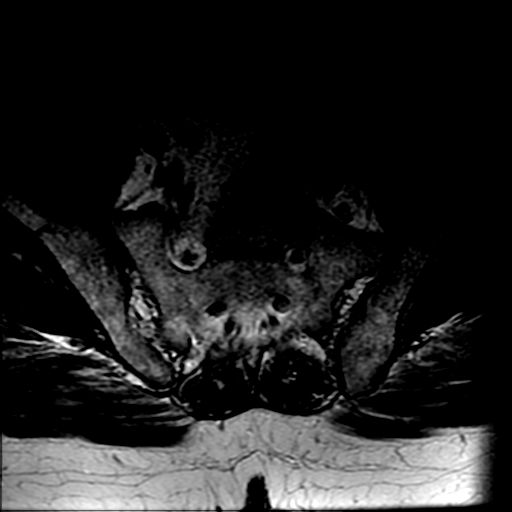
[im 6/37]
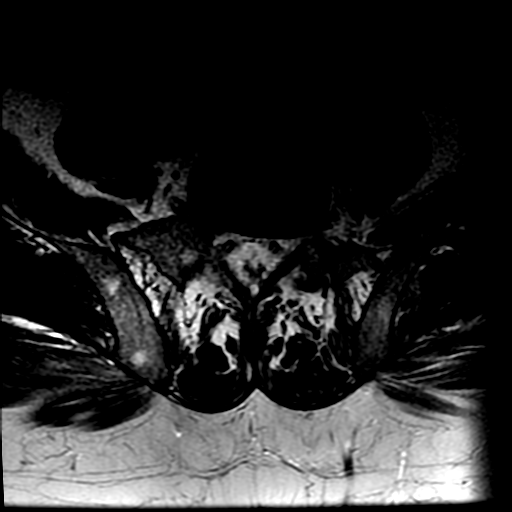
[im 11/37]
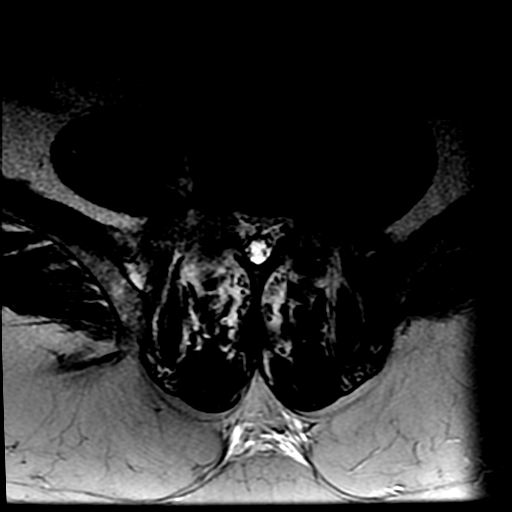
[im 16/37]
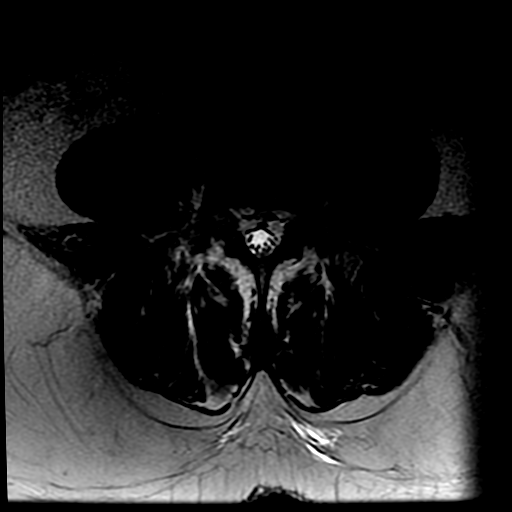
[im 19/37]
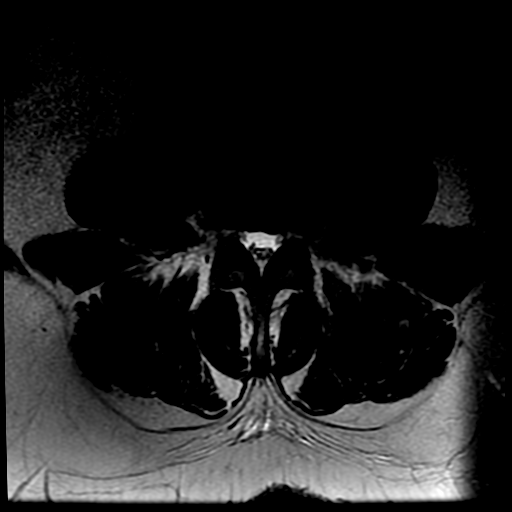
[im 21/37]
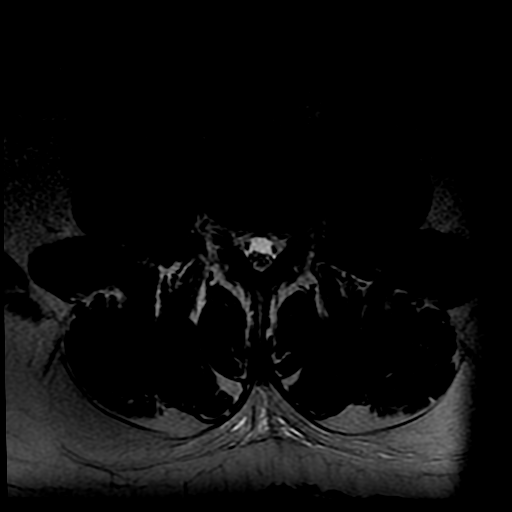
[im 31/37]
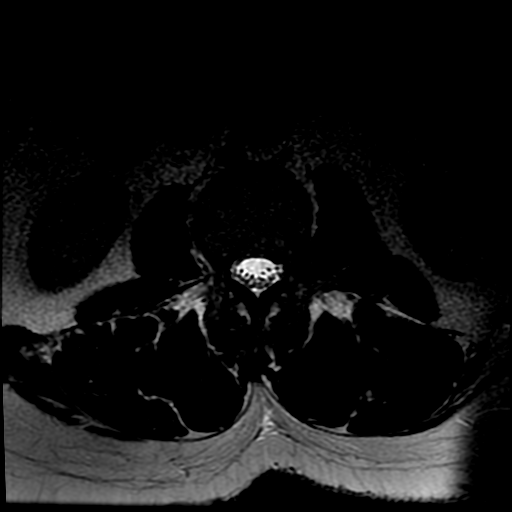

[Series 7: T1 · axial · 4.0mm · 0.39mm/px · z∈[-80,+70]mm · 3 of 37 slices shown (2 of 2)]
[im 6/37]
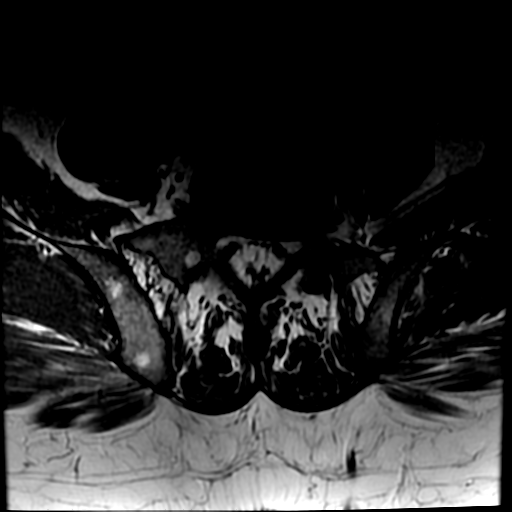
[im 19/37]
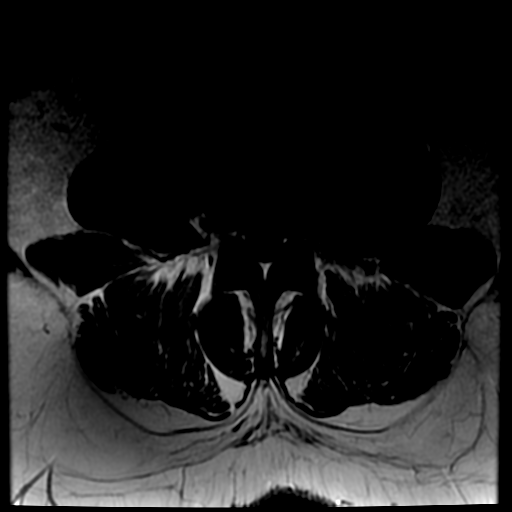
[im 31/37]
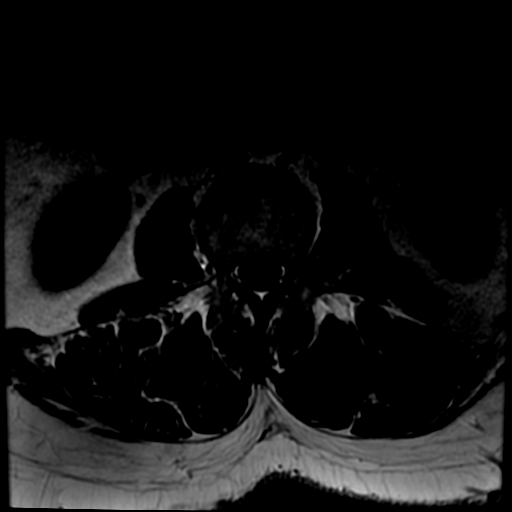

[19 of 48 positions shown; findings below may reference images not displayed]

FINDINGS: Segmentation:  Standard

Alignment:  Physiologic.

Vertebrae:  No fracture, evidence of discitis, or bone lesion.

Conus medullaris: Extends to the L1 level and appears normal.

Paraspinal and other soft tissues: Unremarkable.

Disc levels:

L1-L2:  Normal.

L2-L3:  Normal disc space.  Mild facet arthropathy.  No impingement.

L3-L4:  Normal disc space.  Mild facet arthropathy.  No impingement.

L4-L5: Annular bulge. Mild facet arthropathy and ligamentum flavum
hypertrophy. Mild stenosis. No definite impingement.

L5-S1:  Annular bulge.  Mild facet arthropathy.  No impingement.
IMPRESSION: Minor lumbar spondylosis.  No compressive lesion is identified.

## 2018-01-01 ENCOUNTER — Other Ambulatory Visit: Payer: Self-pay | Admitting: Internal Medicine

## 2018-01-03 ENCOUNTER — Other Ambulatory Visit: Payer: Medicaid Other

## 2018-01-06 ENCOUNTER — Other Ambulatory Visit: Payer: Self-pay | Admitting: *Deleted

## 2018-01-06 DIAGNOSIS — Z113 Encounter for screening for infections with a predominantly sexual mode of transmission: Secondary | ICD-10-CM

## 2018-01-06 DIAGNOSIS — B20 Human immunodeficiency virus [HIV] disease: Secondary | ICD-10-CM

## 2018-01-09 ENCOUNTER — Other Ambulatory Visit: Payer: Medicaid Other

## 2018-01-19 ENCOUNTER — Other Ambulatory Visit: Payer: Self-pay | Admitting: Internal Medicine

## 2018-01-23 ENCOUNTER — Ambulatory Visit (INDEPENDENT_AMBULATORY_CARE_PROVIDER_SITE_OTHER): Payer: Medicaid Other | Admitting: Infectious Diseases

## 2018-01-23 ENCOUNTER — Other Ambulatory Visit: Payer: Self-pay | Admitting: Infectious Diseases

## 2018-01-23 ENCOUNTER — Other Ambulatory Visit (HOSPITAL_COMMUNITY)
Admission: RE | Admit: 2018-01-23 | Discharge: 2018-01-23 | Disposition: A | Discharge status: Home / Self Care | Payer: Medicaid Other | Source: Ambulatory Visit | Attending: Infectious Diseases | Admitting: Infectious Diseases

## 2018-01-23 ENCOUNTER — Encounter: Payer: Self-pay | Admitting: Infectious Diseases

## 2018-01-23 VITALS — BP 133/75 | HR 79 | Temp 98.3°F | Ht 73.0 in | Wt 262.0 lb

## 2018-01-23 DIAGNOSIS — A630 Anogenital (venereal) warts: Secondary | ICD-10-CM

## 2018-01-23 DIAGNOSIS — I25119 Atherosclerotic heart disease of native coronary artery with unspecified angina pectoris: Secondary | ICD-10-CM | POA: Diagnosis not present

## 2018-01-23 DIAGNOSIS — K648 Other hemorrhoids: Secondary | ICD-10-CM | POA: Diagnosis not present

## 2018-01-23 DIAGNOSIS — Z113 Encounter for screening for infections with a predominantly sexual mode of transmission: Secondary | ICD-10-CM

## 2018-01-23 DIAGNOSIS — B20 Human immunodeficiency virus [HIV] disease: Secondary | ICD-10-CM

## 2018-01-23 DIAGNOSIS — F419 Anxiety disorder, unspecified: Secondary | ICD-10-CM | POA: Diagnosis not present

## 2018-01-23 DIAGNOSIS — F329 Major depressive disorder, single episode, unspecified: Secondary | ICD-10-CM

## 2018-01-23 DIAGNOSIS — Z79899 Other long term (current) drug therapy: Secondary | ICD-10-CM

## 2018-01-23 DIAGNOSIS — I10 Essential (primary) hypertension: Secondary | ICD-10-CM | POA: Diagnosis not present

## 2018-01-23 MED ORDER — LIDOCAINE 2 % EX GEL: 1.0000 [IU] | Freq: Four times a day (QID) | CUTANEOUS | 5 refills | Status: DC | PRN

## 2018-01-23 NOTE — Assessment & Plan Note (Signed)
He declines visit with counselor today.

## 2018-01-23 NOTE — Assessment & Plan Note (Signed)
Doing well Asx.

## 2018-01-23 NOTE — Addendum Note (Signed)
Addended by: Dolan Amen D on: 01/23/2018 12:50 PM   Modules accepted: Orders

## 2018-01-23 NOTE — Progress Notes (Signed)
   Subjective:    Patient ID: Benjamin Herrera, male    DOB: Jan 01, 1966, 52 y.o.   MRN: [id number]  HPI 52 yo M with HIV+, anal condyloma, achalasia, HTN. R shoulder pain (previous shoulder prosthesis).  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 Was taking TRV/ISN, had DRVr added at his visit 06-09-12. Then changed to tivicay and complera. At his f/u 09-2015 was changed to genvoya/darunavir due to need for ppi.  He underwent stress test (positive) then cath and PTCA of OM1. 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 He was started on beta-blocker and crestor at his CV f/u (01-21-17).   He has had CV f/u since last visit, Chol noted to not be at goal. Has not had labs yet Has had no problems with his ART.  Needs refill of lidocaine gel he got when he had prev anal eval.  Needs re-write of handicap sticker.   Review of Systems  Constitutional: Negative for appetite change and unexpected weight change.  HENT: Negative for mouth sores.   Respiratory: Negative for cough and shortness of breath.   Cardiovascular: Negative for chest pain and leg swelling.  Gastrointestinal: Negative for blood in stool, constipation and diarrhea.  Genitourinary: Negative for difficulty urinating.  Psychiatric/Behavioral: Negative for dysphoric mood and sleep disturbance.  Please see HPI. All other systems reviewed and negative.      Objective:   Physical Exam  Constitutional: He appears well-developed and well-nourished.  HENT:  Mouth/Throat: No oropharyngeal exudate.  Eyes: Pupils are equal, round, and reactive to light. EOM are normal.  Neck: Neck supple.  Cardiovascular: Normal rate, regular rhythm and normal heart sounds.  Pulmonary/Chest: Effort normal and breath sounds normal.  Abdominal: Soft. Bowel sounds are normal. There is no tenderness. There is no rebound.  Musculoskeletal: He exhibits no edema.  Lymphadenopathy:    He has no cervical adenopathy.  Psychiatric: He has a normal mood and  affect.        Assessment & Plan:

## 2018-01-23 NOTE — Addendum Note (Signed)
Addended by: Dolan Amen D on: 01/23/2018 02:21 PM   Modules accepted: Orders

## 2018-01-23 NOTE — Assessment & Plan Note (Signed)
States he just came off blood thinner at CV f/u.  He is asx with exception of subxyphoid pain which he attributes to GERD.

## 2018-01-23 NOTE — Assessment & Plan Note (Signed)
Will refill his lidocaine as he requests.

## 2018-01-23 NOTE — Assessment & Plan Note (Signed)
He is doing well Will continue his current art Did not want flu shot.  Offered/refused condoms. Has at home.  rtc in 9 months

## 2018-01-23 NOTE — Addendum Note (Signed)
Addended by: HATCHER, JEFFREY C on: 01/23/2018 12:17 PM   Modules accepted: Orders

## 2018-01-23 NOTE — Assessment & Plan Note (Signed)
Will get him back in with Dr Johney Maine.

## 2018-01-24 LAB — CBC
HCT: 42 % (ref 38.5–50.0)
Hemoglobin: 13.9 g/dL (ref 13.2–17.1)
MCH: 25.3 pg — ABNORMAL LOW (ref 27.0–33.0)
MCHC: 33.1 g/dL (ref 32.0–36.0)
MCV: 76.4 fL — ABNORMAL LOW (ref 80.0–100.0)
MPV: 10.1 fL (ref 7.5–12.5)
Platelets: 220 Thousand/uL (ref 140–400)
RBC: 5.5 Million/uL (ref 4.20–5.80)
RDW: 14.6 % (ref 11.0–15.0)
WBC: 6.1 Thousand/uL (ref 3.8–10.8)

## 2018-01-24 LAB — COMPREHENSIVE METABOLIC PANEL WITH GFR
AG Ratio: 1.5 (calc) (ref 1.0–2.5)
ALT: 16 U/L (ref 9–46)
AST: 21 U/L (ref 10–35)
Albumin: 4.3 g/dL (ref 3.6–5.1)
Alkaline phosphatase (APISO): 80 U/L (ref 40–115)
BUN: 17 mg/dL (ref 7–25)
CO2: 27 mmol/L (ref 20–32)
Calcium: 9.3 mg/dL (ref 8.6–10.3)
Chloride: 104 mmol/L (ref 98–110)
Creat: 1.2 mg/dL (ref 0.70–1.33)
Globulin: 2.9 g/dL (ref 1.9–3.7)
Glucose, Bld: 82 mg/dL (ref 65–99)
Potassium: 4.5 mmol/L (ref 3.5–5.3)
Sodium: 139 mmol/L (ref 135–146)
Total Bilirubin: 0.3 mg/dL (ref 0.2–1.2)
Total Protein: 7.2 g/dL (ref 6.1–8.1)

## 2018-01-24 LAB — T-HELPER CELL (CD4) - (RCID CLINIC ONLY)
CD4 % Helper T Cell: 14 % — ABNORMAL LOW (ref 33–55)
CD4 T Cell Abs: 430 /uL (ref 400–2700)

## 2018-01-24 LAB — RPR: RPR: NONREACTIVE

## 2018-01-24 LAB — URINE CYTOLOGY ANCILLARY ONLY
CHLAMYDIA, DNA PROBE: NEGATIVE
NEISSERIA GONORRHEA: NEGATIVE

## 2018-01-25 LAB — HIV-1 RNA QUANT-NO REFLEX-BLD
HIV 1 RNA Quant: <20 copies/mL — AB
HIV-1 RNA Quant, Log: <1.3 Log copies/mL — AB

## 2018-02-02 ENCOUNTER — Other Ambulatory Visit: Payer: Self-pay | Admitting: Infectious Diseases

## 2018-02-14 ENCOUNTER — Other Ambulatory Visit: Payer: Self-pay | Admitting: Infectious Diseases

## 2018-02-14 DIAGNOSIS — B20 Human immunodeficiency virus [HIV] disease: Secondary | ICD-10-CM

## 2018-02-15 ENCOUNTER — Other Ambulatory Visit: Payer: Self-pay | Admitting: Behavioral Health

## 2018-02-15 ENCOUNTER — Telehealth: Payer: Self-pay | Admitting: Behavioral Health

## 2018-02-15 DIAGNOSIS — A630 Anogenital (venereal) warts: Secondary | ICD-10-CM

## 2018-02-15 DIAGNOSIS — K648 Other hemorrhoids: Secondary | ICD-10-CM

## 2018-02-15 MED ORDER — LIDOCAINE-HYDROCORTISONE ACE 3-2.5 % RE KIT: 1.0000 "application " | PACK | Freq: Four times a day (QID) | RECTAL | 5 refills | Status: DC | PRN

## 2018-02-15 NOTE — Telephone Encounter (Signed)
Called patient to verify pharmacy and informed him his new rx for lidocaine/hydrocortisone kit was [facility] to [facility] on [address], [place].  Patient verbalized understanding. Pricilla Riffle RN

## 2018-02-15 NOTE — Telephone Encounter (Signed)
Patient called stating he needed a refill on his Genvoya.  He stated [facility] [address] had not received the electronic prescription.  Informed patient it was resubmitted yesterday 02/14/2018 after 5:00pm.  Patient also stated his last office visit he was prescribed Lidocaine 2% get but that was not the one he was using previously which he feels was better.  Pricilla Riffle RN

## 2018-02-15 NOTE — Telephone Encounter (Signed)
Agree with medication change thanks

## 2018-07-16 ENCOUNTER — Other Ambulatory Visit: Payer: Self-pay | Admitting: Infectious Diseases

## 2018-07-17 ENCOUNTER — Other Ambulatory Visit: Payer: Self-pay | Admitting: *Deleted

## 2018-07-17 DIAGNOSIS — B20 Human immunodeficiency virus [HIV] disease: Secondary | ICD-10-CM

## 2018-07-17 MED ORDER — DARUNAVIR ETHANOLATE 800 MG PO TABS: 800.0000 mg | ORAL_TABLET | Freq: Every day | ORAL | 0 refills | Status: DC

## 2018-07-17 MED ORDER — ELVITEG-COBIC-EMTRICIT-TENOFAF 150-150-200-10 MG PO TABS: 1.0000 | ORAL_TABLET | Freq: Every day | ORAL | 0 refills | Status: DC

## 2018-07-19 ENCOUNTER — Other Ambulatory Visit: Payer: Self-pay | Admitting: Student

## 2018-07-21 ENCOUNTER — Other Ambulatory Visit: Payer: Self-pay | Admitting: Internal Medicine

## 2018-07-21 NOTE — Telephone Encounter (Signed)
Rx request sent to pharmacy.  

## 2018-08-15 ENCOUNTER — Other Ambulatory Visit: Payer: Self-pay | Admitting: Internal Medicine

## 2018-08-15 NOTE — Telephone Encounter (Signed)
Rx request sent to pharmacy.  

## 2018-10-14 ENCOUNTER — Other Ambulatory Visit: Payer: Self-pay | Admitting: Internal Medicine

## 2018-11-03 ENCOUNTER — Other Ambulatory Visit: Payer: Self-pay | Admitting: Internal Medicine

## 2018-11-03 NOTE — Telephone Encounter (Signed)
Rx request sent to pharmacy.  

## 2018-11-16 ENCOUNTER — Other Ambulatory Visit: Payer: Self-pay | Admitting: Internal Medicine

## 2018-11-19 ENCOUNTER — Other Ambulatory Visit: Payer: Self-pay | Admitting: Internal Medicine

## 2018-11-20 NOTE — Telephone Encounter (Signed)
Please review for refill. Last appointment 09/2017.

## 2018-11-21 ENCOUNTER — Telehealth: Payer: Self-pay | Admitting: Internal Medicine

## 2018-11-21 ENCOUNTER — Other Ambulatory Visit: Payer: Self-pay | Admitting: Internal Medicine

## 2018-11-21 MED ORDER — AMLODIPINE BESYLATE 2.5 MG PO TABS: 2.5000 mg | ORAL_TABLET | Freq: Every day | ORAL | 0 refills | Status: DC

## 2018-11-21 MED ORDER — ROSUVASTATIN CALCIUM 40 MG PO TABS: 40.0000 mg | ORAL_TABLET | Freq: Every day | ORAL | 0 refills | Status: DC

## 2018-11-21 MED ORDER — CLOPIDOGREL BISULFATE 75 MG PO TABS: 75.0000 mg | ORAL_TABLET | Freq: Every day | ORAL | 0 refills | Status: DC

## 2018-11-21 MED ORDER — METOPROLOL SUCCINATE ER 25 MG PO TB24: 12.5000 mg | ORAL_TABLET | Freq: Every day | ORAL | 0 refills | Status: DC

## 2018-11-21 NOTE — Telephone Encounter (Signed)
New message:    Patient calling he needs a refill, but they told him he need a appt. He made one today. Patient haws one left. cholesterol pill. Please call patient.

## 2018-11-21 NOTE — Telephone Encounter (Signed)
Spoke with patient. He reports he needs crestor, plavix, amlodipine, metoprolol succinate all refilled if his OV is not until March 5. Explained there are no sooner appointments. Rx(s) sent to pharmacy electronically.

## 2018-11-21 NOTE — Telephone Encounter (Signed)
-----   Message from Alba Destine, [place] sent at 11/20/2018  2:57 PM EST ----- Please schedule this patient an appointment for refills.

## 2018-11-21 NOTE — Telephone Encounter (Signed)
LVM for patient to call office back to schedule follow up appt w/Dr. Debara Pickett, patient was last seen Dec 2018.

## 2018-12-28 ENCOUNTER — Ambulatory Visit: Payer: Medicaid Other | Admitting: Internal Medicine

## 2018-12-29 ENCOUNTER — Encounter: Payer: Self-pay | Admitting: *Deleted

## 2018-12-31 ENCOUNTER — Other Ambulatory Visit: Payer: Self-pay | Admitting: Infectious Diseases

## 2018-12-31 DIAGNOSIS — B20 Human immunodeficiency virus [HIV] disease: Secondary | ICD-10-CM

## 2019-01-01 ENCOUNTER — Other Ambulatory Visit: Payer: Self-pay | Admitting: *Deleted

## 2019-01-01 DIAGNOSIS — B20 Human immunodeficiency virus [HIV] disease: Secondary | ICD-10-CM

## 2019-01-16 ENCOUNTER — Other Ambulatory Visit: Payer: Self-pay | Admitting: Infectious Diseases

## 2019-01-16 DIAGNOSIS — B20 Human immunodeficiency virus [HIV] disease: Secondary | ICD-10-CM

## 2019-01-22 ENCOUNTER — Other Ambulatory Visit: Payer: Self-pay | Admitting: Infectious Diseases

## 2019-01-22 DIAGNOSIS — B20 Human immunodeficiency virus [HIV] disease: Secondary | ICD-10-CM

## 2019-01-31 ENCOUNTER — Telehealth: Payer: Self-pay | Admitting: Infectious Diseases

## 2019-01-31 ENCOUNTER — Other Ambulatory Visit: Payer: Self-pay

## 2019-01-31 DIAGNOSIS — B20 Human immunodeficiency virus [HIV] disease: Secondary | ICD-10-CM

## 2019-01-31 DIAGNOSIS — Z113 Encounter for screening for infections with a predominantly sexual mode of transmission: Secondary | ICD-10-CM

## 2019-01-31 DIAGNOSIS — Z79899 Other long term (current) drug therapy: Secondary | ICD-10-CM

## 2019-01-31 NOTE — Telephone Encounter (Signed)
[id number] Pre-Screening Questions: 01/31/19  Do you currently have a fever (>100 F), chills or unexplained body aches? NO  Are you currently experiencing new cough, shortness of breath, sore throat, runny nose?NO   Have you recently travelled outside the state of [place] in the last 14 days? NO   Have you been in contact with someone that is currently pending confirmation of Covid19 testing or has been confirmed to have the [place] virus?  NO  **If the patient answers NO to ALL questions -  advise the patient to please call the clinic before coming to the office should any symptoms develop.

## 2019-02-01 ENCOUNTER — Other Ambulatory Visit: Payer: Medicaid Other

## 2019-02-01 ENCOUNTER — Other Ambulatory Visit (HOSPITAL_COMMUNITY)
Admission: RE | Admit: 2019-02-01 | Discharge: 2019-02-01 | Disposition: A | Discharge status: Home / Self Care | Payer: Medicaid Other | Source: Ambulatory Visit | Attending: Infectious Diseases | Admitting: Infectious Diseases

## 2019-02-01 ENCOUNTER — Other Ambulatory Visit: Payer: Self-pay

## 2019-02-01 DIAGNOSIS — B20 Human immunodeficiency virus [HIV] disease: Secondary | ICD-10-CM | POA: Diagnosis not present

## 2019-02-01 DIAGNOSIS — Z113 Encounter for screening for infections with a predominantly sexual mode of transmission: Secondary | ICD-10-CM | POA: Insufficient documentation

## 2019-02-01 DIAGNOSIS — Z79899 Other long term (current) drug therapy: Secondary | ICD-10-CM

## 2019-02-02 LAB — T-HELPER CELL (CD4) - (RCID CLINIC ONLY)
CD4 % Helper T Cell: 12 % — ABNORMAL LOW (ref 33–55)
CD4 T Cell Abs: 310 /uL — ABNORMAL LOW (ref 400–2700)

## 2019-02-05 LAB — CBC WITH DIFFERENTIAL/PLATELET
Absolute Monocytes: 593 cells/uL (ref 200–950)
Basophils Absolute: 68 cells/uL (ref 0–200)
Basophils Relative: 0.9 %
Eosinophils Absolute: 593 cells/uL — ABNORMAL HIGH (ref 15–500)
Eosinophils Relative: 7.8 %
HCT: 45 % (ref 38.5–50.0)
Hemoglobin: 14.6 g/dL (ref 13.2–17.1)
Lymphs Abs: 2592 cells/uL (ref 850–3900)
MCH: 24.6 pg — ABNORMAL LOW (ref 27.0–33.0)
MCHC: 32.4 g/dL (ref 32.0–36.0)
MCV: 75.8 fL — ABNORMAL LOW (ref 80.0–100.0)
MPV: 9.9 fL (ref 7.5–12.5)
Monocytes Relative: 7.8 %
Neutro Abs: 3754 cells/uL (ref 1500–7800)
Neutrophils Relative %: 49.4 %
Platelets: 262 10*3/uL (ref 140–400)
RBC: 5.94 10*6/uL — ABNORMAL HIGH (ref 4.20–5.80)
RDW: 15 % (ref 11.0–15.0)
Total Lymphocyte: 34.1 %
WBC: 7.6 10*3/uL (ref 3.8–10.8)

## 2019-02-05 LAB — HIV-1 RNA QUANT-NO REFLEX-BLD
HIV 1 RNA Quant: <20 copies/mL
HIV-1 RNA Quant, Log: <1.3 Log copies/mL

## 2019-02-05 LAB — LIPID PANEL
Cholesterol: 123 mg/dL (ref <200)
HDL: 30 mg/dL — ABNORMAL LOW (ref >=40)
LDL Cholesterol (Calc): 70 mg/dL (calc)
Non-HDL Cholesterol (Calc): 93 mg/dL (calc) (ref <130)
Total CHOL/HDL Ratio: 4.1 (calc) (ref <5.0)
Triglycerides: 142 mg/dL (ref <150)

## 2019-02-05 LAB — COMPREHENSIVE METABOLIC PANEL
AG Ratio: 1.5 (calc) (ref 1.0–2.5)
ALT: 10 U/L (ref 9–46)
AST: 15 U/L (ref 10–35)
Albumin: 4.3 g/dL (ref 3.6–5.1)
Alkaline phosphatase (APISO): 83 U/L (ref 35–144)
BUN: 10 mg/dL (ref 7–25)
CO2: 24 mmol/L (ref 20–32)
Calcium: 9.2 mg/dL (ref 8.6–10.3)
Chloride: 103 mmol/L (ref 98–110)
Creat: 1.12 mg/dL (ref 0.70–1.33)
Globulin: 2.9 g/dL (calc) (ref 1.9–3.7)
Glucose, Bld: 85 mg/dL (ref 65–99)
Potassium: 3.4 mmol/L — ABNORMAL LOW (ref 3.5–5.3)
Sodium: 139 mmol/L (ref 135–146)
Total Bilirubin: 0.4 mg/dL (ref 0.2–1.2)
Total Protein: 7.2 g/dL (ref 6.1–8.1)

## 2019-02-05 LAB — URINE CYTOLOGY ANCILLARY ONLY
Chlamydia: NEGATIVE
Neisseria Gonorrhea: NEGATIVE

## 2019-02-05 LAB — RPR: RPR Ser Ql: NONREACTIVE

## 2019-02-14 ENCOUNTER — Other Ambulatory Visit: Payer: Self-pay | Admitting: Internal Medicine

## 2019-02-16 ENCOUNTER — Ambulatory Visit (INDEPENDENT_AMBULATORY_CARE_PROVIDER_SITE_OTHER): Payer: Medicaid Other | Admitting: Infectious Diseases

## 2019-02-16 ENCOUNTER — Telehealth: Payer: Self-pay

## 2019-02-16 ENCOUNTER — Other Ambulatory Visit: Payer: Self-pay

## 2019-02-16 DIAGNOSIS — B20 Human immunodeficiency virus [HIV] disease: Secondary | ICD-10-CM

## 2019-02-16 DIAGNOSIS — Z79899 Other long term (current) drug therapy: Secondary | ICD-10-CM

## 2019-02-16 DIAGNOSIS — F1910 Other psychoactive substance abuse, uncomplicated: Secondary | ICD-10-CM

## 2019-02-16 DIAGNOSIS — I10 Essential (primary) hypertension: Secondary | ICD-10-CM

## 2019-02-16 DIAGNOSIS — S42201D Unspecified fracture of upper end of right humerus, subsequent encounter for fracture with routine healing: Secondary | ICD-10-CM

## 2019-02-16 DIAGNOSIS — I25119 Atherosclerotic heart disease of native coronary artery with unspecified angina pectoris: Secondary | ICD-10-CM | POA: Diagnosis not present

## 2019-02-16 DIAGNOSIS — Z113 Encounter for screening for infections with a predominantly sexual mode of transmission: Secondary | ICD-10-CM

## 2019-02-16 MED ORDER — IBUPROFEN 800 MG PO TABS: 800.0000 mg | ORAL_TABLET | Freq: Three times a day (TID) | ORAL | 0 refills | Status: DC | PRN

## 2019-02-16 NOTE — Assessment & Plan Note (Signed)
States he has been staying clean.  Continue to encourage.

## 2019-02-16 NOTE — Assessment & Plan Note (Signed)
Has CV f/u next week Does not check at home.

## 2019-02-16 NOTE — Assessment & Plan Note (Signed)
Currently asx Chol well controlled. Appreciate CV eval

## 2019-02-16 NOTE — Assessment & Plan Note (Signed)
Doing well Quarantined Taking medicine without issues rtc in 9 months.

## 2019-02-16 NOTE — Progress Notes (Signed)
   Subjective:    Patient ID: Benjamin Herrera, male    DOB: [birth year], 53 y.o.   MRN: [id number]  HPI 53yo M with HIV+, anal condyloma, achalasia, HTN. R shoulder pain (previous shoulder prosthesis).  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 Was taking TRV/ISN, had DRVr added at his visit 06-09-12. Then changed to tivicay and complera. At his f/u 09-2015 was changed to genvoya/darunavir due to need for ppi. He underwent stress test (positive) then cath and PTCA of OM1. 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 He was started on beta-blocker and crestor at his CV f/u (01-21-17).   Today complains of diffuse pain. Worried that his shoulder is infected again.  No f/c. No sick exposures. No problems with ART.   HIV 1 RNA Quant (copies/mL)  Date Value  02/01/2019 <20 NOT DETECTED  01/23/2018 <20 DETECTED (A)  01/26/2017 31 (H)   CD4 T Cell Abs (/uL)  Date Value  02/01/2019 310 (L)  01/23/2018 430  01/26/2017 470    Review of Systems  Constitutional: Negative for appetite change, chills, fever and unexpected weight change.  Respiratory: Negative for cough and shortness of breath.   Cardiovascular: Negative for chest pain.  Gastrointestinal: Negative for constipation and diarrhea.  Genitourinary: Negative for difficulty urinating.  Please see HPI. All other systems reviewed and negative.     Objective:   Physical Exam Phone visit      Assessment & Plan:

## 2019-02-16 NOTE — Telephone Encounter (Signed)

## 2019-02-16 NOTE — Assessment & Plan Note (Signed)
Will refill his ibuprofen 800mg  Will send him back to his original surgeon at his request.

## 2019-02-20 ENCOUNTER — Telehealth: Payer: Self-pay | Admitting: Internal Medicine

## 2019-02-20 ENCOUNTER — Telehealth (INDEPENDENT_AMBULATORY_CARE_PROVIDER_SITE_OTHER): Payer: Medicaid Other | Admitting: Internal Medicine

## 2019-02-20 DIAGNOSIS — G4733 Obstructive sleep apnea (adult) (pediatric): Secondary | ICD-10-CM

## 2019-02-20 DIAGNOSIS — I10 Essential (primary) hypertension: Secondary | ICD-10-CM

## 2019-02-20 DIAGNOSIS — Z9989 Dependence on other enabling machines and devices: Secondary | ICD-10-CM | POA: Diagnosis not present

## 2019-02-20 DIAGNOSIS — E785 Hyperlipidemia, unspecified: Secondary | ICD-10-CM | POA: Diagnosis not present

## 2019-02-20 DIAGNOSIS — I25119 Atherosclerotic heart disease of native coronary artery with unspecified angina pectoris: Secondary | ICD-10-CM | POA: Diagnosis not present

## 2019-02-20 DIAGNOSIS — B20 Human immunodeficiency virus [HIV] disease: Secondary | ICD-10-CM

## 2019-02-20 NOTE — Telephone Encounter (Signed)
New Message   Patient returning call please call him back.

## 2019-02-20 NOTE — Telephone Encounter (Signed)
Unable to determine who called patient however I did review his instructions from todays virtual visit with Dr Debara Pickett as listed below:  Medication Instructions:  Stop: Plavix 75 mg  If you need a refill on your cardiac medications before your next appointment, please call your pharmacy.   Lab work: None  Testing/Procedures: None  Follow-Up: At [facility], you and your health needs are our priority. As part of our continuing mission to provide you with exceptional heart care, we have created designated Provider Care Teams. These Care Teams include your primary Cardiologist (physician) and Advanced Practice Providers (APPs -  Physician Assistants and Nurse Practitioners) who all work together to provide you with the care you need, when you need it.  You will need a follow up appointment in 1 years.  Please call our office 2 months in advance to schedule this appointment.  You may see Dr. Debara Pickett or one of the following Advanced Practice Providers on your designated Care Team:  Almyra Deforest, PA-C  Fabian Sharp, PA-C  Pt states understanding and had no further questions.

## 2019-02-20 NOTE — Patient Instructions (Signed)
Medication Instructions:  Stop: Plavix 75 mg  If you need a refill on your cardiac medications before your next appointment, please call your pharmacy.   Lab work: None  Testing/Procedures: None  Follow-Up: At [facility], you and your health needs are our priority.  As part of our continuing mission to provide you with exceptional heart care, we have created designated Provider Care Teams.  These Care Teams include your primary Cardiologist (physician) and Advanced Practice Providers (APPs -  Physician Assistants and Nurse Practitioners) who all work together to provide you with the care you need, when you need it. You will need a follow up appointment in 1 years.  Please call our office 2 months in advance to schedule this appointment.  You may see Dr. Debara Pickett or one of the following Advanced Practice Providers on your designated Care Team: Almyra Deforest, [place] . Fabian Sharp, PA-C

## 2019-02-21 ENCOUNTER — Encounter: Payer: Self-pay | Admitting: Internal Medicine

## 2019-02-22 ENCOUNTER — Ambulatory Visit: Payer: Medicaid Other | Admitting: Internal Medicine

## 2019-02-22 ENCOUNTER — Encounter: Payer: Self-pay | Admitting: Internal Medicine

## 2019-02-22 NOTE — Progress Notes (Signed)
Virtual Visit via Telephone Note   This visit type was conducted due to national recommendations for restrictions regarding the COVID-19 Pandemic (e.g. social distancing) in an effort to limit this patient's exposure and mitigate transmission in our community.  Due to his co-morbid illnesses, this patient is at least at moderate risk for complications without adequate follow up.  This format is felt to be most appropriate for this patient at this time.  The patient did not have access to video technology/had technical difficulties with video requiring transitioning to audio format only (telephone).  All issues noted in this document were discussed and addressed.  No physical exam could be performed with this format.  Please refer to the patient's chart for his  consent to telehealth for [facility].   Evaluation Performed:  Telephone visit  Date:  02/22/2019   ID:  [name], DOB 12-14-1965, MRN [id number]  Patient Location:  [place] [address]  Provider location:   [address], [place], [place] [address]  PCP:  Charlott Rakes, MD  Cardiologist:  No primary care provider on file. Electrophysiologist:  None   Chief Complaint:  No complaints  History of Present Illness:    Benjamin Herrera is a 53 y.o. male who presents via audio/video conferencing for a telehealth visit today.  Benjamin Herrera was seen today for telephone visit.  This is routine follow-up.  He was just recently seen via telemedicine visit with Dr. Johnnye Sima as part of the HIV clinic.  Overall Benjamin Herrera seems to be doing well.  His viral load is undetectable.  He does not monitor his blood pressure at home and does not have a blood pressure cuff so we could not evaluate that today.  He does have history of coronary disease and prior cath showing occlusion of the RCA in 2017 with left to right collaterals and a 90% OM1 lesion which was treated with a drug-eluting stent.  He denies any  recurrent angina.  He had repeat lipid testing about 3 weeks ago which shows good cholesterol control as his LDL is now 70, triglycerides 142, total cholesterol 123 and HDL of 30.  He denies any current polysubstance use.  He is struggling with humerus fracture which she has rods and that is causing him pain.  Apparently he will be referred back to his surgeon for reevaluation.  The patient does not have symptoms concerning for COVID-19 infection (fever, chills, cough, or new SHORTNESS OF BREATH).    Prior CV studies:   The following studies were reviewed today:  Lab work Chart reviewed  PMHx:  Past Medical History:  Diagnosis Date   Achalasia and cardiospasm    Allergy    Arthritis    back ,shoulders    Asthma    Boils    under arms hx of   CAD (coronary artery disease)    a. 09/2016: cath showing 100% RCA occlusion with collaterals and 90% OM1 (treated with DES)   Candidiasis of mouth    Candidiasis of unspecified site    Cholelithiasis    Cocaine abuse ([place])    Quit 09/2016   Condyloma acuminatum in male of scrotum & anal canal s/p laser ablation 07/03/2012   Coronary artery disease    Dysphagia    Fracture, humerus    GERD (gastroesophageal reflux disease)    Heart attack ([place])    Hemorrhoids    Hepatitis 1993   A   Herpes  labialis    HIV (human immunodeficiency virus infection) ([place]) [place]   Hypertension Dx 2014   off all bp meds for last 9 or 10 months   Pilonidal disease 01/10/2012   Polysubstance abuse ([place])    Psoriasis    hx of   Sleep apnea    does  have CPAP   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/03/2012   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 08/03/2012    Past Surgical History:  Procedure Laterality Date   BALLOON DILATION N/A 11/11/2015   Procedure: BALLOON DILATION;  Surgeon: Mauri Pole, MD;  Location: [facility];  Service: Endoscopy;  Laterality:  N/A;   BOTOX INJECTION  10/11/2012   Procedure: BOTOX INJECTION;  Surgeon: Inda Castle, MD;  Location: [facility];  Service: Endoscopy;  Laterality: N/A;   BOTOX INJECTION N/A 01/25/2013   Procedure: BOTOX INJECTION;  Surgeon: Inda Castle, MD;  [facility]: [facility];  Service: Endoscopy;  Laterality: N/A;   BOTOX INJECTION N/A 06/21/2013   Procedure: BOTOX INJECTION;  Surgeon: Inda Castle, MD;  [facility];  Service: Endoscopy;  Laterality: N/A;   BOTOX INJECTION N/A 10/08/2013   Procedure: BOTOX INJECTION;  Surgeon: Inda Castle, MD;  [facility];  Service: Endoscopy;  Laterality: N/A;   BOTOX INJECTION N/A 06/16/2015   Procedure: BOTOX INJECTION;  Surgeon: Inda Castle, MD;  [facility];  Service: Endoscopy;  Laterality: N/A;   CARDIAC CATHETERIZATION N/A 10/11/2016   Procedure: Left Heart Cath and Coronary Angiography;  Surgeon: Burnell Blanks, MD;  mRCA 100% w/ L>R collaterals, OM1 90%   CARDIAC CATHETERIZATION N/A 10/11/2016   Procedure: Coronary Stent Intervention;  Surgeon: Burnell Blanks, MD;  RESOLUTE ONYX 3.5X15 DES OM1   COLONOSCOPY  05/19/2012   Procedure: COLONOSCOPY;  Surgeon: Inda Castle, MD;  [facility];  Service: Endoscopy;  Laterality: N/A;  jill trying to contact pt to come in 0830 for 930 case, phone not accepting messages   COLONOSCOPY     ESOPHAGEAL MANOMETRY  05/29/2012   Procedure: ESOPHAGEAL MANOMETRY (EM);  Surgeon: Inda Castle, MD;  [facility];  Service: Endoscopy;  Laterality: N/A;   ESOPHAGEAL MANOMETRY N/A 10/06/2015   Procedure: ESOPHAGEAL MANOMETRY (EM);  Surgeon: Mauri Pole, MD;  [facility];  Service: Endoscopy;  Laterality: N/A;   ESOPHAGOGASTRODUODENOSCOPY  11/11/2011   Procedure: ESOPHAGOGASTRODUODENOSCOPY (EGD);  Surgeon: Inda Castle, MD;  [facility];  Service: Endoscopy;  Laterality: N/A;  botox injection  called Pt  to change time of procedure per Dr Deatra Ina   ESOPHAGOGASTRODUODENOSCOPY  03/10/2012   Procedure: ESOPHAGOGASTRODUODENOSCOPY (EGD);  Surgeon: Inda Castle, MD;  Location: [facility];  Service: Endoscopy;  Laterality: N/A;   ESOPHAGOGASTRODUODENOSCOPY  10/11/2012   Procedure: ESOPHAGOGASTRODUODENOSCOPY (EGD);  Surgeon: Inda Castle, MD;  Location: [facility];  Service: Endoscopy;  Laterality: N/A;   ESOPHAGOGASTRODUODENOSCOPY N/A 01/25/2013   Procedure: ESOPHAGOGASTRODUODENOSCOPY (EGD);  Surgeon: Inda Castle, MD;  Location: [facility];  Service: Endoscopy;  Laterality: N/A;   ESOPHAGOGASTRODUODENOSCOPY N/A 06/21/2013   Procedure: ESOPHAGOGASTRODUODENOSCOPY (EGD);  Surgeon: Inda Castle, MD;  Location: [facility];  Service: Endoscopy;  Laterality: N/A;   ESOPHAGOGASTRODUODENOSCOPY (EGD) WITH PROPOFOL N/A 10/08/2013   Procedure: ESOPHAGOGASTRODUODENOSCOPY (EGD) WITH PROPOFOL;  Surgeon: Inda Castle, MD;  [facility];  Service: Endoscopy;  Laterality: N/A;   ESOPHAGOGASTRODUODENOSCOPY (EGD) WITH PROPOFOL N/A 06/16/2015  Procedure: ESOPHAGOGASTRODUODENOSCOPY (EGD) WITH PROPOFOL;  Surgeon: Inda Castle, MD;  [facility];  Service: Endoscopy;  Laterality: N/A;   ESOPHAGOGASTRODUODENOSCOPY (EGD) WITH PROPOFOL N/A 11/11/2015   Procedure: ESOPHAGOGASTRODUODENOSCOPY (EGD) WITH PROPOFOL;  Surgeon: Mauri Pole, MD;  [facility];  Service: Endoscopy;  Laterality: N/A;   EXAMINATION UNDER ANESTHESIA  07/24/2012   Procedure: EXAM UNDER ANESTHESIA;  Surgeon: Adin Hector, MD;  [facility]: [facility];  Service: General;  Laterality: N/A;   humeral fracture surgery Right yrs ago   PILONIDAL CYST EXCISION  07/24/2012   Procedure: CYST EXCISION PILONIDAL SIMPLE;  Surgeon: Adin Hector, MD;  Location: [facility];  Service: General;  Laterality: N/A;  Exam Under Anesthesia,, Excision Pilonidal Disease,    right shoulder replacement  2008   x 2   UPPER  GASTROINTESTINAL ENDOSCOPY     WART FULGURATION  07/24/2012   Procedure: FULGURATION ANAL WART;  Surgeon: Adin Hector, MD;  Location: [facility];  Service: General;  Laterality: N/A;  excision of raphe mass    FAMHx:  Family History  Problem Relation Age of Onset   Arthritis Mother    Hypertension Mother    Heart disease Mother        MI age 62   Diabetes Maternal Grandmother    Stomach cancer Paternal Grandmother    Colon cancer Neg Hx    Colon polyps Neg Hx    Esophageal cancer Neg Hx    Rectal cancer Neg Hx     SOCHx:   reports that he quit smoking about 3 years ago. His smoking use included cigarettes. He has a 12.50 pack-year smoking history. He has never used smokeless tobacco. He reports current drug use. Frequency: 3.00 times per week. Drugs: Marijuana and Cocaine. He reports that he does not drink alcohol.  ALLERGIES:  Allergies  Allergen Reactions   Citrus Swelling    Mouth itching and swelling     MEDS:  Current Meds  Medication Sig   albuterol (PROVENTIL HFA;VENTOLIN HFA) 108 (90 Base) MCG/ACT inhaler Inhale 2 puffs into the lungs every 6 (six) hours as needed for wheezing or shortness of breath.   amLODipine (NORVASC) 2.5 MG tablet Take 1 tablet (2.5 mg total) by mouth daily.   aspirin 81 MG chewable tablet Chew 1 tablet (81 mg total) by mouth daily.   cyclobenzaprine (FLEXERIL) 10 MG tablet Take 1 tablet (10 mg total) by mouth 2 (two) times daily as needed for muscle spasms.   gabapentin (NEURONTIN) 600 MG tablet Take 1 tablet (600 mg total) by mouth at bedtime.   GENVOYA 150-150-200-10 MG TABS tablet TAKE 1 TABLET BY MOUTH EVERY DAY WITH BREAKFAST   ibuprofen (ADVIL) 800 MG tablet Take 1 tablet (800 mg total) by mouth every 8 (eight) hours as needed for moderate pain (of shoulder. TAKE WITH FOOD).   metoprolol succinate (TOPROL-XL) 25 MG 24 hr tablet Take 0.5 tablets (12.5 mg total) by mouth daily.   Naphazoline-Pheniramine (OPCON-A OP)  Apply 1-2 drops to eye 2 (two) times daily as needed (dry eyes.).    nitroGLYCERIN (NITROSTAT) 0.4 MG SL tablet Place 0.4 mg under the tongue every 5 (five) minutes as needed for chest pain.   PREZISTA 800 MG tablet TAKE 1 TABLET BY MOUTH DAILY WITH BREAKFAST. CALL FOR JANUARY APPT.   rosuvastatin (CRESTOR) 40 MG tablet Take 1 tablet (40 mg total) by mouth daily. Pt needs to keep upcoming appt in April for more refills   [DISCONTINUED] clopidogrel (  PLAVIX) 75 MG tablet Take 1 tablet (75 mg total) by mouth daily with breakfast.     ROS: Pertinent items noted in HPI and remainder of comprehensive ROS otherwise negative.  Labs/Other Tests and Data Reviewed:    Recent Labs: 02/01/2019: ALT 10; BUN 10; Creat 1.12; Hemoglobin 14.6; Platelets 262; Potassium 3.4; Sodium 139   Recent Lipid Panel Lab Results  Component Value Date/Time   CHOL 123 02/01/2019 04:04 PM   CHOL 127 10/24/2017 11:18 AM   TRIG 142 02/01/2019 04:04 PM   HDL 30 (L) 02/01/2019 04:04 PM   HDL 35 (L) 10/24/2017 11:18 AM   CHOLHDL 4.1 02/01/2019 04:04 PM   LDLCALC 70 02/01/2019 04:04 PM    Wt Readings from Last 3 Encounters:  01/23/18 262 lb (118.8 kg)  10/06/17 264 lb 3.2 oz (119.8 kg)  09/13/17 267 lb 9.6 oz (121.4 kg)     Exam:    Vital Signs:  There were no vitals taken for this visit.   Exam not performed due to telephone visit  ASSESSMENT & PLAN:    1. CAD with occluded mid to distal RCA and left-to-right collaterals and status post PCI to OM1 with a 3.015 mm resolute onyx DES (09/2016) 2. Hypertension 3. Dyslipidemia 4. HIV- on protease inhibitors 5. OSA on CPAP  Benjamin Herrera seems to be doing well from a cardiac standpoint.  He denies any chest pain or worsening shortness of breath.  His cholesterol is now at goal with LDL of 70.  Blood pressure has been well controlled although he does not have a home cuff to monitor that so I cannot evaluate what that is today.  He reports compliance with CPAP.  He  is compliance with protease inhibitor therapy has led to an undetectable viral load.  In general he is doing well.  No changes were made to his medicines today.  COVID-19 Education: The signs and symptoms of COVID-19 were discussed with the patient and how to seek care for testing (follow up with PCP or arrange E-visit).  The importance of social distancing was discussed today.  Patient Risk:   After full review of this patients clinical status, I feel that they are at least moderate risk at this time.  Time:   Today, I have spent 25 minutes with the patient with telehealth technology discussing coronary disease, hypertension, dyslipidemia, sleep apnea and HIV disease.     Medication Adjustments/Labs and Tests Ordered: Current medicines are reviewed at length with the patient today.  Concerns regarding medicines are outlined above.   Tests Ordered: No orders of the defined types were placed in this encounter.   Medication Changes: No orders of the defined types were placed in this encounter.   Disposition:  in 1 year(s)  Pixie Casino, MD, [facility], [place] Director of the Advanced Lipid Disorders &  Cardiovascular Risk Reduction Clinic Diplomate of the American Board of Clinical Lipidology Attending Cardiologist  Direct Dial: [phone]   Fax: [phone]  Website:  [email or web address]  Pixie Casino, MD  02/22/2019 11:01 AM

## 2019-02-28 DIAGNOSIS — M25511 Pain in right shoulder: Secondary | ICD-10-CM | POA: Diagnosis not present

## 2019-03-07 DIAGNOSIS — Z96611 Presence of right artificial shoulder joint: Secondary | ICD-10-CM | POA: Diagnosis not present

## 2019-03-07 DIAGNOSIS — M25511 Pain in right shoulder: Secondary | ICD-10-CM | POA: Diagnosis not present

## 2019-03-23 DIAGNOSIS — G4710 Hypersomnia, unspecified: Secondary | ICD-10-CM | POA: Diagnosis not present

## 2019-03-23 DIAGNOSIS — I10 Essential (primary) hypertension: Secondary | ICD-10-CM | POA: Diagnosis not present

## 2019-03-23 DIAGNOSIS — G4733 Obstructive sleep apnea (adult) (pediatric): Secondary | ICD-10-CM | POA: Diagnosis not present

## 2019-04-26 ENCOUNTER — Other Ambulatory Visit: Payer: Self-pay | Admitting: Internal Medicine

## 2019-05-04 DIAGNOSIS — M25511 Pain in right shoulder: Secondary | ICD-10-CM | POA: Diagnosis not present

## 2019-05-04 DIAGNOSIS — Z96619 Presence of unspecified artificial shoulder joint: Secondary | ICD-10-CM | POA: Diagnosis not present

## 2019-05-04 DIAGNOSIS — I252 Old myocardial infarction: Secondary | ICD-10-CM | POA: Diagnosis not present

## 2019-05-04 DIAGNOSIS — Z8619 Personal history of other infectious and parasitic diseases: Secondary | ICD-10-CM | POA: Diagnosis not present

## 2019-05-04 DIAGNOSIS — Z87891 Personal history of nicotine dependence: Secondary | ICD-10-CM | POA: Diagnosis not present

## 2019-05-17 ENCOUNTER — Other Ambulatory Visit: Payer: Self-pay | Admitting: Internal Medicine

## 2019-06-15 DIAGNOSIS — M25511 Pain in right shoulder: Secondary | ICD-10-CM | POA: Diagnosis not present

## 2019-06-15 DIAGNOSIS — G8929 Other chronic pain: Secondary | ICD-10-CM | POA: Diagnosis not present

## 2019-06-15 DIAGNOSIS — I208 Other forms of angina pectoris: Secondary | ICD-10-CM | POA: Diagnosis not present

## 2019-07-04 ENCOUNTER — Other Ambulatory Visit: Payer: Self-pay | Admitting: Internal Medicine

## 2019-07-11 ENCOUNTER — Other Ambulatory Visit: Payer: Self-pay

## 2019-07-31 ENCOUNTER — Other Ambulatory Visit: Payer: Self-pay | Admitting: Internal Medicine

## 2019-08-05 ENCOUNTER — Other Ambulatory Visit: Payer: Self-pay | Admitting: Infectious Diseases

## 2019-08-05 DIAGNOSIS — B20 Human immunodeficiency virus [HIV] disease: Secondary | ICD-10-CM

## 2019-08-06 ENCOUNTER — Other Ambulatory Visit: Payer: Self-pay

## 2019-08-06 DIAGNOSIS — B20 Human immunodeficiency virus [HIV] disease: Secondary | ICD-10-CM

## 2019-08-06 MED ORDER — GENVOYA 150-150-200-10 MG PO TABS: ORAL_TABLET | ORAL | 3 refills | Status: DC

## 2019-08-06 MED ORDER — DARUNAVIR ETHANOLATE 800 MG PO TABS: ORAL_TABLET | ORAL | 3 refills | Status: DC

## 2019-09-11 ENCOUNTER — Other Ambulatory Visit: Payer: Self-pay | Admitting: Internal Medicine

## 2019-10-10 DIAGNOSIS — G4733 Obstructive sleep apnea (adult) (pediatric): Secondary | ICD-10-CM | POA: Diagnosis not present

## 2019-10-31 ENCOUNTER — Encounter: Payer: Self-pay | Admitting: Emergency Medicine

## 2019-10-31 ENCOUNTER — Other Ambulatory Visit: Payer: Self-pay

## 2019-10-31 ENCOUNTER — Ambulatory Visit
Admission: EM | Admit: 2019-10-31 | Discharge: 2019-10-31 | Disposition: A | Discharge status: Home / Self Care | Payer: Medicaid Other | Attending: Emergency Medicine | Admitting: Emergency Medicine

## 2019-10-31 DIAGNOSIS — M6530 Trigger finger, unspecified finger: Secondary | ICD-10-CM

## 2019-10-31 DIAGNOSIS — G8929 Other chronic pain: Secondary | ICD-10-CM

## 2019-10-31 DIAGNOSIS — S42201D Unspecified fracture of upper end of right humerus, subsequent encounter for fracture with routine healing: Secondary | ICD-10-CM

## 2019-10-31 DIAGNOSIS — M79645 Pain in left finger(s): Secondary | ICD-10-CM | POA: Diagnosis not present

## 2019-10-31 DIAGNOSIS — M25562 Pain in left knee: Secondary | ICD-10-CM | POA: Diagnosis not present

## 2019-10-31 MED ORDER — IBUPROFEN 800 MG PO TABS: 800.0000 mg | ORAL_TABLET | Freq: Three times a day (TID) | ORAL | 0 refills | Status: DC | PRN

## 2019-10-31 NOTE — ED Provider Notes (Signed)
[name]    CSN: [id number] Arrival date & time: 10/31/19  0949      History   Chief Complaint Chief Complaint  Patient presents with  . thumb pain    HPI Benjamin Herrera is a 54 y.o. male with history of arthritis, HIV presenting for left thumb pain x1 week.  Patient states it is worse in the morning.  Describes a popping/catching sensation.  Has tried using a wrist brace with some relief.  Denies trauma to the area, recent fall. Patient also noting acute on chronic left knee pain.  States is been worse over the last 2 weeks.  Has tried Tylenol without significant relief.  Denies fall, trauma to the area.  Past Medical History:  Diagnosis Date  . Achalasia and cardiospasm   . Allergy   . Arthritis    back ,shoulders   . Asthma   . Boils    under arms hx of  . CAD (coronary artery disease)    a. 09/2016: cath showing 100% RCA occlusion with collaterals and 90% OM1 (treated with DES)  . Candidiasis of mouth   . Candidiasis of unspecified site   . Cholelithiasis   . Cocaine abuse ([place])    Quit 09/2016  . Condyloma acuminatum in male of scrotum & anal canal s/p laser ablation 07/03/2012  . Coronary artery disease   . Dysphagia   . Fracture, humerus   . GERD (gastroesophageal reflux disease)   . Heart attack ([place])   . Hemorrhoids   . Hepatitis 1993   A  . Herpes labialis   . HIV (human immunodeficiency virus infection) ([place]) [place]  . Hypertension Dx 2014   off all bp meds for last 9 or 10 months  . Pilonidal disease 01/10/2012  . Polysubstance abuse ([place])   . Psoriasis    hx of  . Sleep apnea    does  have CPAP  . 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 Heart palpitations 05/02/2017  . Hyperlipidemia LDL goal <70 01/24/2017  . Anxiety and depression 11/30/2016  .  Coronary artery disease involving native coronary artery of native heart with angina pectoris ([place]) 10/28/2016  . Primary insomnia 10/28/2016  . Bilateral wrist pain 10/28/2016  . Polysubstance abuse ([place])   . Unstable angina ([place])   . Chest pain with low risk for cardiac etiology 10/08/2016  . Dysphagia   . GERD (gastroesophageal reflux disease) 01/10/2015  . Bulging lumbar disc 10/07/2014  . Syncope and collapse 10/07/2014  . Mild sleep apnea 10/07/2014  . 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  . Numbness and tingling in left hand 02/12/2013  . Myalgia 02/12/2013  . Internal hemorrhoids with bleeding 01/30/2013  . 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 Condyloma acuminatum in male of scrotum & anal canal s/p laser ablation 07/03/2012  . Essential hypertension 08/31/2010  . PSORIASIS 11/20/2009  . OTHER CANDIDIASIS OF OTHER SPECIFIED SITES 06/26/2009  . Marijuana use 05/16/2008  . Asthma 05/16/2008  . HIV disease ([place]) 09/03/2006  . Closed fracture of part of humerus 09/03/2006    Past Surgical History:  Procedure Laterality Date  . BALLOON DILATION N/A 11/11/2015   Procedure: BALLOON DILATION;  Surgeon: Mauri Pole, MD;  Location: [facility];  Service: Endoscopy;  Laterality: N/A;  . BOTOX INJECTION  10/11/2012   Procedure: BOTOX INJECTION;  Surgeon: Inda Castle, MD;  [facility]: [facility];  Service: Endoscopy;  Laterality: N/A;  . BOTOX INJECTION N/A 01/25/2013   Procedure: BOTOX INJECTION;  Surgeon: Inda Castle, MD;  [facility];  Service: Endoscopy;  Laterality: N/A;  . BOTOX INJECTION N/A 06/21/2013   Procedure: BOTOX INJECTION;  Surgeon: Inda Castle, MD;  [facility];  Service: Endoscopy;  Laterality: N/A;  . BOTOX INJECTION N/A 10/08/2013   Procedure: BOTOX INJECTION;  Surgeon: Inda Castle, MD;  [facility];  Service: Endoscopy;  Laterality: N/A;    . BOTOX INJECTION N/A 06/16/2015   Procedure: BOTOX INJECTION;  Surgeon: Inda Castle, MD;  [facility];  Service: Endoscopy;  Laterality: N/A;  . CARDIAC CATHETERIZATION N/A 10/11/2016   Procedure: Left Heart Cath and Coronary Angiography;  Surgeon: Burnell Blanks, MD;  mRCA 100% w/ L>R collaterals, OM1 90%  . CARDIAC CATHETERIZATION N/A 10/11/2016   Procedure: Coronary Stent Intervention;  Surgeon: Burnell Blanks, MD;  Hinda Lenis [id number] DES OM1  . COLONOSCOPY  05/19/2012   Procedure: COLONOSCOPY;  Surgeon: Inda Castle, MD;  [facility];  Service: Endoscopy;  Laterality: N/A;  jill trying to contact pt to come in 0830 for 930 case, phone not accepting messages  . COLONOSCOPY    . ESOPHAGEAL MANOMETRY  05/29/2012   Procedure: ESOPHAGEAL MANOMETRY (EM);  Surgeon: Inda Castle, MD;  [facility];  Service: Endoscopy;  Laterality: N/A;  . ESOPHAGEAL MANOMETRY N/A 10/06/2015   Procedure: ESOPHAGEAL MANOMETRY (EM);  Surgeon: Mauri Pole, MD;  [facility];  Service: Endoscopy;  Laterality: N/A;  . ESOPHAGOGASTRODUODENOSCOPY  11/11/2011   Procedure: ESOPHAGOGASTRODUODENOSCOPY (EGD);  Surgeon: Inda Castle, MD;  Location[facility];  Service: Endoscopy;  Laterality: N/A;  botox injection  called Pt to change time of procedure per Dr Deatra Ina  . ESOPHAGOGASTRODUODENOSCOPY  03/10/2012   Procedure: ESOPHAGOGASTRODUODENOSCOPY (EGD);  Surgeon: Inda Castle, MD;  Location: [facility];  Service: Endoscopy;  Laterality: N/A;  . ESOPHAGOGASTRODUODENOSCOPY  10/11/2012   Procedure: ESOPHAGOGASTRODUODENOSCOPY (EGD);  Surgeon: Inda Castle, MD;  Location: [facility];  Service: Endoscopy;  Laterality: N/A;  . ESOPHAGOGASTRODUODENOSCOPY N/A 01/25/2013   Procedure: ESOPHAGOGASTRODUODENOSCOPY (EGD);  Surgeon: Inda Castle, MD;  Location: [facility];  Service: Endoscopy;  Laterality: N/A;  . ESOPHAGOGASTRODUODENOSCOPY N/A  06/21/2013   Procedure: ESOPHAGOGASTRODUODENOSCOPY (EGD);  Surgeon: Inda Castle, MD;  Location: [facility];  Service: Endoscopy;  Laterality: N/A;  . ESOPHAGOGASTRODUODENOSCOPY (EGD) WITH PROPOFOL N/A 10/08/2013   Procedure: ESOPHAGOGASTRODUODENOSCOPY (EGD) WITH PROPOFOL;  Surgeon: Inda Castle, MD;  [facility];  Service: Endoscopy;  Laterality: N/A;  . ESOPHAGOGASTRODUODENOSCOPY (EGD) WITH PROPOFOL N/A 06/16/2015   Procedure: ESOPHAGOGASTRODUODENOSCOPY (EGD) WITH PROPOFOL;  Surgeon: Inda Castle, MD;  [facility];  Service: Endoscopy;  Laterality: N/A;  . ESOPHAGOGASTRODUODENOSCOPY (EGD) WITH PROPOFOL N/A 11/11/2015   Procedure: ESOPHAGOGASTRODUODENOSCOPY (EGD) WITH PROPOFOL;  Surgeon: Mauri Pole, MD;  [facility];  Service: Endoscopy;  Laterality: N/A;  . EXAMINATION UNDER ANESTHESIA  07/24/2012   Procedure: EXAM UNDER ANESTHESIA;  Surgeon: Adin Hector, MD;  [facility]: [facility];  Service: General;  Laterality: N/A;  . humeral fracture surgery Right yrs ago  . PILONIDAL CYST EXCISION  07/24/2012   Procedure: CYST EXCISION PILONIDAL SIMPLE;  Surgeon: Adin Hector, MD;  Location: [facility];  Service: General;  Laterality: N/A;  Exam Under Anesthesia,, Excision Pilonidal Disease,   . right shoulder replacement  2008   x 2  . UPPER GASTROINTESTINAL ENDOSCOPY    . WART FULGURATION  07/24/2012   Procedure: FULGURATION ANAL WART;  Surgeon: Adin Hector, MD;  Location: [facility];  Service: General;  Laterality: N/A;  excision of raphe mass       Home Medications    Prior to Admission medications   Medication Sig Start Date End Date Taking? Authorizing Provider  albuterol (PROVENTIL HFA;VENTOLIN HFA) 108 (90 Base) MCG/ACT inhaler Inhale 2 puffs into the lungs every 6 (six) hours as needed for wheezing or shortness of breath. 07/04/17   Charlott Rakes, MD  amLODipine (NORVASC) 2.5 MG tablet TAKE 1 TABLET BY MOUTH EVERY DAY 04/26/19   Hilty, Nadean Corwin,  MD  aspirin 81 MG chewable tablet Chew 1 tablet (81 mg total) by mouth daily. 10/28/16   Funches, Adriana Mccallum, MD  darunavir (PREZISTA) 800 MG tablet TAKE 1 TABLET BY MOUTH DAILY WITH BREAKFAST. CALL FOR JANUARY APPT. 08/06/19   Campbell Riches, MD  elvitegravir-cobicistat-emtricitabine-tenofovir (GENVOYA) 150-150-200-10 MG TABS tablet TAKE 1 TABLET BY MOUTH EVERY DAY WITH BREAKFAST 08/06/19   Campbell Riches, MD  gabapentin (NEURONTIN) 600 MG tablet Take 1 tablet (600 mg total) by mouth at bedtime. 07/06/17   Charlott Rakes, MD  ibuprofen (ADVIL) 800 MG tablet Take 1 tablet (800 mg total) by mouth every 8 (eight) hours as needed for moderate pain (of shoulder. TAKE WITH FOOD). 10/31/19   Hall-Potvin, [place], PA-C  metoprolol succinate (TOPROL-XL) 25 MG 24 hr tablet TAKE 1/2 TABLET BY MOUTH EVERY DAY 04/26/19   Hilty, Nadean Corwin, MD  Naphazoline-Pheniramine (OPCON-A OP) Apply 1-2 drops to eye 2 (two) times daily as needed (dry eyes.).     [provider]  nitroGLYCERIN (NITROSTAT) 0.4 MG SL tablet Place 0.4 mg under the tongue every 5 (five) minutes as needed for chest pain.    [provider]  pantoprazole (PROTONIX) 40 MG tablet Take 1 tablet (40 mg total) by mouth 2 (two) times daily before a meal. Patient taking differently: Take 40 mg by mouth daily.  07/22/17   Mauri Pole, MD  rosuvastatin (CRESTOR) 40 MG tablet TAKE 1 TABLET BY MOUTH EVERY DAY *PT NEEDS APPT 09/11/19   Pixie Casino, MD    Family History Family History  Problem Relation Age of Onset  . Arthritis Mother   . Hypertension Mother   . Heart disease Mother        MI age 58  . Diabetes Maternal Grandmother   . Stomach cancer Paternal Grandmother   . Colon cancer Neg Hx   . Colon polyps Neg Hx   . Esophageal cancer Neg Hx   . Rectal cancer Neg Hx     Social History Social History   Tobacco Use  . Smoking status: Former Smoker    Packs/day: 0.50    Years: 25.00    Pack years: 12.50     Types: Cigarettes    Quit date: 11/26/2015    Years since quitting: 3.9  . Smokeless tobacco: Never Used  Substance Use Topics  . Alcohol use: No    Alcohol/week: 0.0 standard drinks  . Drug use: Yes    Frequency: 3.0 times per week    Types: Marijuana, Cocaine     Allergies   Citrus   Review of Systems Review of Systems  Constitutional: Negative for  fatigue and fever.  Respiratory: Negative for cough and shortness of breath.   Cardiovascular: Negative for chest pain and palpitations.  Musculoskeletal:       Positive for left thumb pain, catching and left knee pain  Neurological: Negative for weakness and numbness.     Physical Exam Triage Vital Signs ED Triage Vitals  Enc Vitals Group     BP      Pulse      Resp      Temp      Temp src      SpO2      Weight      Height      Head Circumference      Peak Flow      Pain Score      Pain Loc      Pain Edu?      Excl. in [place]?    No data found.  Updated Vital Signs BP (!) 152/77 (BP Location: Right Arm)   Pulse 76   Temp 98.2 F (36.8 C) (Oral)   Resp 16   SpO2 97%   Visual Acuity Right Eye Distance:   Left Eye Distance:   Bilateral Distance:    Right Eye Near:   Left Eye Near:    Bilateral Near:     Physical Exam Constitutional:      General: He is not in acute distress. HENT:     Head: Normocephalic and atraumatic.  Eyes:     General: No scleral icterus.    Pupils: Pupils are equal, round, and reactive to light.  Cardiovascular:     Rate and Rhythm: Normal rate.  Pulmonary:     Effort: Pulmonary effort is normal. No respiratory distress.     Breath sounds: No wheezing.  Musculoskeletal:     Comments: Left thumb without deformity.  Patient does have catching over PIP, MCP.  No obvious swelling, bony tenderness.  Neurovascular intact. Left knee without deformity, swelling, effusion.  Mild crepitus with full ROM.  Strength 5/5.  DTRs 2+ bilaterally.  Gait mildly antalgic, favoring left.  Skin:     Capillary Refill: Capillary refill takes less than 2 seconds.     Coloration: Skin is not jaundiced or pale.  Neurological:     Mental Status: He is alert and oriented to person, place, and time.      UC Treatments / Results  Labs (all labs ordered are listed, but only abnormal results are displayed) Labs Reviewed - No data to display  EKG   Radiology No results found.  Procedures Procedures (including critical care time)  Medications Ordered in UC Medications - No data to display  Initial Impression / Assessment and Plan / UC Course  I have reviewed the triage vital signs and the nursing notes.  Pertinent labs & imaging results that were available during my care of the patient were reviewed by me and considered in my medical decision making (see chart for details).     H&P benign: Radiography deferred at this time.  Consistent with possible trigger thumb, possible arthritis of left knee.  Patient to continue wearing left wrist brace, practice RICE for thumb pain.  Ace wrap applied to left knee in office which patient tolerated well.  Will practice RICE, trial ibuprofen as adjuvant therapy.  Patient follow-up with his orthopedists for further evaluation/management of both issues evaluated today.  Return precautions discussed, patient verbalized understanding and is agreeable to plan. Final Clinical Impressions(s) / UC Diagnoses  Final diagnoses:  Chronic pain of left knee  Trigger finger, acquired     Discharge Instructions     Take ibuprofen as prescribed: be sure to stop if you develop severe abdominal pain, blood in stool. Follow up with your orthopedic providers in 1 week for further management.    ED Prescriptions    Medication Sig Dispense Auth. Provider   ibuprofen (ADVIL) 800 MG tablet Take 1 tablet (800 mg total) by mouth every 8 (eight) hours as needed for moderate pain (of shoulder. TAKE WITH FOOD). 30 tablet Hall-Potvin, [place], PA-C     PDMP not  reviewed this encounter.   Hall-Potvin, [place], [place] 11/01/19 1123

## 2019-10-31 NOTE — ED Triage Notes (Addendum)
Pt presents to [facility] for assessment of left thumb pain x 1 week, waking up to it.  C/o a popping sensation, swelling to the area, and constant pain.    Patient also c/o left knee pain x 2 weeks, woke up to it.  Painful with ambulation.

## 2019-10-31 NOTE — Discharge Instructions (Addendum)
Take ibuprofen as prescribed: be sure to stop if you develop severe abdominal pain, blood in stool. Follow up with your orthopedic providers in 1 week for further management.

## 2019-11-05 ENCOUNTER — Ambulatory Visit: Payer: Medicaid Other | Admitting: Physician Assistant

## 2019-11-06 ENCOUNTER — Other Ambulatory Visit: Payer: Medicaid Other

## 2019-11-07 ENCOUNTER — Other Ambulatory Visit: Payer: Medicaid Other

## 2019-11-07 ENCOUNTER — Other Ambulatory Visit: Payer: Self-pay

## 2019-11-07 DIAGNOSIS — I25119 Atherosclerotic heart disease of native coronary artery with unspecified angina pectoris: Secondary | ICD-10-CM

## 2019-11-07 DIAGNOSIS — Z113 Encounter for screening for infections with a predominantly sexual mode of transmission: Secondary | ICD-10-CM | POA: Diagnosis not present

## 2019-11-07 DIAGNOSIS — Z79899 Other long term (current) drug therapy: Secondary | ICD-10-CM

## 2019-11-07 DIAGNOSIS — B20 Human immunodeficiency virus [HIV] disease: Secondary | ICD-10-CM | POA: Diagnosis not present

## 2019-11-08 ENCOUNTER — Encounter: Payer: Medicaid Other | Admitting: Physician Assistant

## 2019-11-08 LAB — T-HELPER CELL (CD4) - (RCID CLINIC ONLY)
CD4 % Helper T Cell: 16 % — ABNORMAL LOW (ref 33–65)
CD4 T Cell Abs: 465 /uL (ref 400–1790)

## 2019-11-10 LAB — COMPREHENSIVE METABOLIC PANEL
AG Ratio: 1.5 (calc) (ref 1.0–2.5)
ALT: 13 U/L (ref 9–46)
AST: 17 U/L (ref 10–35)
Albumin: 4.1 g/dL (ref 3.6–5.1)
Alkaline phosphatase (APISO): 73 U/L (ref 35–144)
BUN: 7 mg/dL (ref 7–25)
CO2: 30 mmol/L (ref 20–32)
Calcium: 8.9 mg/dL (ref 8.6–10.3)
Chloride: 102 mmol/L (ref 98–110)
Creat: 1.12 mg/dL (ref 0.70–1.33)
Globulin: 2.8 g/dL (calc) (ref 1.9–3.7)
Glucose, Bld: 126 mg/dL — ABNORMAL HIGH (ref 65–99)
Potassium: 3.7 mmol/L (ref 3.5–5.3)
Sodium: 139 mmol/L (ref 135–146)
Total Bilirubin: 0.5 mg/dL (ref 0.2–1.2)
Total Protein: 6.9 g/dL (ref 6.1–8.1)

## 2019-11-10 LAB — LIPID PANEL
Cholesterol: 128 mg/dL (ref <200)
HDL: 31 mg/dL — ABNORMAL LOW (ref >=40)
LDL Cholesterol (Calc): 80 mg/dL (calc)
Non-HDL Cholesterol (Calc): 97 mg/dL (calc) (ref <130)
Total CHOL/HDL Ratio: 4.1 (calc) (ref <5.0)
Triglycerides: 90 mg/dL (ref <150)

## 2019-11-10 LAB — CBC
HCT: 44 % (ref 38.5–50.0)
Hemoglobin: 14.2 g/dL (ref 13.2–17.1)
MCH: 24.9 pg — ABNORMAL LOW (ref 27.0–33.0)
MCHC: 32.3 g/dL (ref 32.0–36.0)
MCV: 77.1 fL — ABNORMAL LOW (ref 80.0–100.0)
MPV: 9.3 fL (ref 7.5–12.5)
Platelets: 208 10*3/uL (ref 140–400)
RBC: 5.71 10*6/uL (ref 4.20–5.80)
RDW: 14.3 % (ref 11.0–15.0)
WBC: 6.9 10*3/uL (ref 3.8–10.8)

## 2019-11-10 LAB — HIV-1 RNA QUANT-NO REFLEX-BLD
HIV 1 RNA Quant: 366 copies/mL — ABNORMAL HIGH
HIV-1 RNA Quant, Log: 2.56 Log copies/mL — ABNORMAL HIGH

## 2019-11-10 LAB — RPR: RPR Ser Ql: NONREACTIVE

## 2019-11-10 NOTE — Progress Notes (Signed)
This encounter was created in error - please disregard.

## 2019-11-15 DIAGNOSIS — M79642 Pain in left hand: Secondary | ICD-10-CM | POA: Diagnosis not present

## 2019-11-15 DIAGNOSIS — M65312 Trigger thumb, left thumb: Secondary | ICD-10-CM | POA: Diagnosis not present

## 2019-11-19 DIAGNOSIS — M222X2 Patellofemoral disorders, left knee: Secondary | ICD-10-CM | POA: Diagnosis not present

## 2019-11-20 ENCOUNTER — Ambulatory Visit: Payer: Medicaid Other | Admitting: Infectious Diseases

## 2019-11-22 ENCOUNTER — Other Ambulatory Visit: Payer: Self-pay

## 2019-11-23 ENCOUNTER — Ambulatory Visit: Payer: Medicaid Other | Admitting: Infectious Diseases

## 2019-11-28 DIAGNOSIS — M65312 Trigger thumb, left thumb: Secondary | ICD-10-CM | POA: Diagnosis not present

## 2020-01-03 ENCOUNTER — Ambulatory Visit (INDEPENDENT_AMBULATORY_CARE_PROVIDER_SITE_OTHER): Payer: Medicaid Other | Admitting: Infectious Diseases

## 2020-01-03 ENCOUNTER — Other Ambulatory Visit: Payer: Self-pay

## 2020-01-03 VITALS — BP 132/80 | HR 77 | Temp 98.0°F | Wt 250.0 lb

## 2020-01-03 DIAGNOSIS — I25119 Atherosclerotic heart disease of native coronary artery with unspecified angina pectoris: Secondary | ICD-10-CM

## 2020-01-03 DIAGNOSIS — Z79899 Other long term (current) drug therapy: Secondary | ICD-10-CM

## 2020-01-03 DIAGNOSIS — B20 Human immunodeficiency virus [HIV] disease: Secondary | ICD-10-CM | POA: Diagnosis not present

## 2020-01-03 DIAGNOSIS — Z113 Encounter for screening for infections with a predominantly sexual mode of transmission: Secondary | ICD-10-CM | POA: Diagnosis not present

## 2020-01-03 DIAGNOSIS — A630 Anogenital (venereal) warts: Secondary | ICD-10-CM | POA: Diagnosis not present

## 2020-01-03 NOTE — Assessment & Plan Note (Signed)
States he has no further lesions.  Encouraged him to arrange f/u with Dr Johney Maine.

## 2020-01-03 NOTE — Progress Notes (Signed)
   Subjective:    Patient ID: Benjamin Herrera, male    DOB: July 02, 1966, 54 y.o.   MRN: [id number]  HPI 54yo M with HIV+, anal condyloma, achalasia, HTN. R shoulder pain (previous shoulder prosthesis).  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 Was taking TRV/ISN, had DRVr added at his visit 06-09-12. Then changed to tivicay and complera. At his f/u 09-2015 was changed to genvoya/darunavir due to need for ppi. He underwent stress test (positive) then cath and PTCA of OM1 (09-2016). 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 He was started on beta-blocker and crestor at his CV f/u (01-21-17).  He is doing well today. He has not had shoulder surgery. He did have release of tendon in his L thumb last month.  No problems with ART. Admits to missing a "few doses" after learning his HIV RNA.  has irritation on his anus but no further condyloma.  Complains of sores in his mouth for last 3 days.   Review of Systems  Constitutional: Negative for appetite change and unexpected weight change.  HENT: Positive for mouth sores.   Gastrointestinal: Negative for constipation and diarrhea.  Genitourinary: Negative for difficulty urinating.     HIV 1 RNA Quant (copies/mL)  Date Value  11/07/2019 366 (H)  02/01/2019 <20 NOT DETECTED  01/23/2018 <20 DETECTED (A)   CD4 T Cell Abs (/uL)  Date Value  11/07/2019 465  02/01/2019 310 (L)  01/23/2018 430        Objective:   Physical Exam Constitutional:      General: He is not in acute distress.    Appearance: He is obese. He is not ill-appearing.  HENT:     Mouth/Throat:     Mouth: Mucous membranes are moist.     Pharynx: No oropharyngeal exudate or posterior oropharyngeal erythema.     Comments: Small area on inner, lower gum- appears to be broken cyst. He also states he had recent dental work here causing irritation to his lip.  Eyes:     Extraocular Movements: Extraocular movements intact.     Pupils: Pupils are equal, round, and  reactive to light.  Cardiovascular:     Rate and Rhythm: Normal rate and regular rhythm.  Pulmonary:     Effort: Pulmonary effort is normal.     Breath sounds: Normal breath sounds.  Abdominal:     General: Bowel sounds are normal. There is no distension.     Palpations: Abdomen is soft.     Tenderness: There is no abdominal tenderness.  Musculoskeletal:        General: Normal range of motion.     Right lower leg: No edema.     Left lower leg: No edema.  Neurological:     Mental Status: He is alert.  Psychiatric:        Mood and Affect: Mood normal.           Assessment & Plan:

## 2020-01-03 NOTE — Assessment & Plan Note (Signed)
Currently asx.  Will continue to watch.  He is on asa, beta-blocker. Statin.

## 2020-01-03 NOTE — Assessment & Plan Note (Signed)
Offered flu and PCV 13 vax. Refused He speaks often of being tired of taking pills.  Will refer him to pharm for possible cabaneuva.  Offered/refused condoms.  Not sexually active.  rtc in 7 months.

## 2020-01-07 ENCOUNTER — Encounter: Payer: Self-pay | Admitting: Physician Assistant

## 2020-01-16 ENCOUNTER — Other Ambulatory Visit: Payer: Self-pay | Admitting: Internal Medicine

## 2020-02-27 ENCOUNTER — Telehealth: Payer: Self-pay | Admitting: Pharmacist

## 2020-02-27 NOTE — Telephone Encounter (Signed)
Faxed patient's Cabenuva enrollment form to [employer] today. They will assess patient's insurance and send a benefits investigation form detailing how to start the injections (where to get it from and cost). Once benefits investigation is complete, we will order oral lead-in therapy and start patient on treatment. Will update encounter with further details when available. 

## 2020-02-28 NOTE — Telephone Encounter (Signed)
Thanks

## 2020-03-19 NOTE — Telephone Encounter (Signed)
Patient is approved for Cabenuva injections.  He can fill them at [facility] and has a $3 copay each month. Will start patient on treatment once clinic and epic systems are ready to go.

## 2020-04-23 NOTE — Progress Notes (Deleted)
Cardiology Office Note:    Date:  04/23/2020   ID:  Benjamin Herrera, DOB [birth year], MRN [id number]  PCP:  Charlott Rakes, MD  Cardiologist:  Pixie Casino, MD   Referring MD: Charlott Rakes, MD   No chief complaint on file. ***  History of Present Illness:    Benjamin Herrera is a 54 y.o. male with a hx of CAD, hypertension, hyperlipidemia, and is HIV positive with a history of polysubstance abuse.  He has a history of CAD with heart cath in 2017 revealing occlusion of the RCA with left-to-right collaterals and a 90% OM1 lesion that was treated with DES.  Reportedly has abstained from cocaine since 2017.  He was last seen via telehealth visit with Dr. Debara Pickett on 02/12/2019.  He was doing well at that time.  Presents today for routine follow-up.   CAD -Occlusion of the RCA collaterals -OM1 treated with DES -Continue   Hypertension   Hyperlipidemia    Past Medical History:  Diagnosis Date  . Achalasia and cardiospasm   . Allergy   . Arthritis    back ,shoulders   . Asthma   . Boils    under arms hx of  . CAD (coronary artery disease)    a. 09/2016: cath showing 100% RCA occlusion with collaterals and 90% OM1 (treated with DES)  . Candidiasis of mouth   . Candidiasis of unspecified site   . Cholelithiasis   . Cocaine abuse ([place])    Quit 09/2016  . Condyloma acuminatum in male of scrotum & anal canal s/p laser ablation 07/03/2012  . Coronary artery disease   . Dysphagia   . Fracture, humerus   . GERD (gastroesophageal reflux disease)   . Heart attack ([place])   . Hemorrhoids   . Hepatitis 1993   A  . Herpes labialis   . HIV (human immunodeficiency virus infection) ([place]) [place]  . Hypertension Dx 2014   off all bp meds for last 9 or 10 months  . Pilonidal disease 01/10/2012  . Polysubstance abuse ([place])   . Psoriasis    hx of  . Sleep apnea    does  have CPAP  . 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 Squamous cell  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 08/03/2012    Past Surgical History:  Procedure Laterality Date  . BALLOON DILATION N/A 11/11/2015   Procedure: BALLOON DILATION;  Surgeon: Mauri Pole, MD;  Location: [facility];  Service: Endoscopy;  Laterality: N/A;  . BOTOX INJECTION  10/11/2012   Procedure: BOTOX INJECTION;  Surgeon: Inda Castle, MD;  [facility];  Service: Endoscopy;  Laterality: N/A;  . BOTOX INJECTION N/A 01/25/2013   Procedure: BOTOX INJECTION;  Surgeon: Inda Castle, MD;  [facility];  Service: Endoscopy;  Laterality: N/A;  . BOTOX INJECTION N/A 06/21/2013   Procedure: BOTOX INJECTION;  Surgeon: Inda Castle, MD;  [facility];  Service: Endoscopy;  Laterality: N/A;  . BOTOX INJECTION N/A 10/08/2013   Procedure: BOTOX INJECTION;  Surgeon: Inda Castle, MD;  [facility];  Service: Endoscopy;  Laterality: N/A;  . BOTOX INJECTION N/A 06/16/2015   Procedure: BOTOX INJECTION;  Surgeon: Inda Castle, MD;  [facility];  Service: Endoscopy;  Laterality: N/A;  . CARDIAC CATHETERIZATION N/A 10/11/2016   Procedure: Left Heart Cath and Coronary Angiography;  Surgeon: Burnell Blanks, MD;  mRCA 100% w/  L>R collaterals, OM1 90%  . CARDIAC CATHETERIZATION N/A 10/11/2016   Procedure: Coronary Stent Intervention;  Surgeon: Burnell Blanks, MD;  Hinda Lenis 6.9C78 DES OM1  . COLONOSCOPY  05/19/2012   Procedure: COLONOSCOPY;  Surgeon: Inda Castle, MD;  [facility];  Service: Endoscopy;  Laterality: N/A;  jill trying to contact pt to come in 0830 for 930 case, phone not accepting messages  . COLONOSCOPY    . ESOPHAGEAL MANOMETRY  05/29/2012   Procedure: ESOPHAGEAL MANOMETRY (EM);  Surgeon: Inda Castle, MD;  [facility];  Service: Endoscopy;  Laterality: N/A;  . ESOPHAGEAL MANOMETRY N/A 10/06/2015   Procedure: ESOPHAGEAL MANOMETRY (EM);  Surgeon: Mauri Pole,  MD;  [facility];  Service: Endoscopy;  Laterality: N/A;  . ESOPHAGOGASTRODUODENOSCOPY  11/11/2011   Procedure: ESOPHAGOGASTRODUODENOSCOPY (EGD);  Surgeon: Inda Castle, MD;  [facility];  Service: Endoscopy;  Laterality: N/A;  botox injection  called Pt to change time of procedure per Dr Deatra Ina  . ESOPHAGOGASTRODUODENOSCOPY  03/10/2012   Procedure: ESOPHAGOGASTRODUODENOSCOPY (EGD);  Surgeon: Inda Castle, MD;  Location: [facility];  Service: Endoscopy;  Laterality: N/A;  . ESOPHAGOGASTRODUODENOSCOPY  10/11/2012   Procedure: ESOPHAGOGASTRODUODENOSCOPY (EGD);  Surgeon: Inda Castle, MD;  Location: [facility];  Service: Endoscopy;  Laterality: N/A;  . ESOPHAGOGASTRODUODENOSCOPY N/A 01/25/2013   Procedure: ESOPHAGOGASTRODUODENOSCOPY (EGD);  Surgeon: Inda Castle, MD;  Location: [facility];  Service: Endoscopy;  Laterality: N/A;  . ESOPHAGOGASTRODUODENOSCOPY N/A 06/21/2013   Procedure: ESOPHAGOGASTRODUODENOSCOPY (EGD);  Surgeon: Inda Castle, MD;  [facility];  Service: Endoscopy;  Laterality: N/A;  . ESOPHAGOGASTRODUODENOSCOPY (EGD) WITH PROPOFOL N/A 10/08/2013   Procedure: ESOPHAGOGASTRODUODENOSCOPY (EGD) WITH PROPOFOL;  Surgeon: Inda Castle, MD;  [facility];  Service: Endoscopy;  Laterality: N/A;  . ESOPHAGOGASTRODUODENOSCOPY (EGD) WITH PROPOFOL N/A 06/16/2015   Procedure: ESOPHAGOGASTRODUODENOSCOPY (EGD) WITH PROPOFOL;  Surgeon: Inda Castle, MD;  [facility];  Service: Endoscopy;  Laterality: N/A;  . ESOPHAGOGASTRODUODENOSCOPY (EGD) WITH PROPOFOL N/A 11/11/2015   Procedure: ESOPHAGOGASTRODUODENOSCOPY (EGD) WITH PROPOFOL;  Surgeon: Mauri Pole, MD;  [facility];  Service: Endoscopy;  Laterality: N/A;  . EXAMINATION UNDER ANESTHESIA  07/24/2012   Procedure: EXAM UNDER ANESTHESIA;  Surgeon: Adin Hector, MD;  [facility]: [facility];  Service: General;  Laterality: N/A;  . humeral fracture surgery Right yrs  ago  . PILONIDAL CYST EXCISION  07/24/2012   Procedure: CYST EXCISION PILONIDAL SIMPLE;  Surgeon: Adin Hector, MD;  [facility];  Service: General;  Laterality: N/A;  Exam Under Anesthesia,, Excision Pilonidal Disease,   . right shoulder replacement  2008   x 2  . UPPER GASTROINTESTINAL ENDOSCOPY    . WART FULGURATION  07/24/2012   Procedure: FULGURATION ANAL WART;  Surgeon: Adin Hector, MD;  [facility];  Service: General;  Laterality: N/A;  excision of raphe mass    Current Medications: No outpatient medications have been marked as taking for the 04/24/20 encounter (Appointment) with Ledora Bottcher, [place].     Allergies:   Citrus   Social History   Socioeconomic History  . Marital status: Single    Spouse name: Not on file  . Number of children: Not on file  . Years of education: Not on file  . Highest education level: Not on file  Occupational History  . Occupation: Disabled  Tobacco Use  . Smoking status: Former Smoker    Packs/day: 0.50    Years: 25.00  Pack years: 12.50    Types: Cigarettes    Quit date: 11/26/2015    Years since quitting: 4.4  . Smokeless tobacco: Never Used  Substance and Sexual Activity  . Alcohol use: No    Alcohol/week: 0.0 standard drinks  . Drug use: Yes    Frequency: 3.0 times per week    Types: Marijuana, Cocaine  . Sexual activity: Not Currently    Partners: Male    Comment: pt. given condoms, used marijuana -3 days ago 05/14/16  Other Topics Concern  . Not on file  Social History Narrative   Pt lives in a ground floor apartment in [place].   Social Determinants of Health   Financial Resource Strain:   . Difficulty of Paying Living Expenses:   Food Insecurity:   . Worried About Charity fundraiser in the Last Year:   . Arboriculturist in the Last Year:   Transportation Needs:   . Film/video editor (Medical):   Marland Kitchen Lack of Transportation (Non-Medical):   Physical Activity:   . Days of Exercise per Week:   .  Minutes of Exercise per Session:   Stress:   . Feeling of Stress :   Social Connections:   . Frequency of Communication with Friends and Family:   . Frequency of Social Gatherings with Friends and Family:   . Attends Religious Services:   . Active Member of Clubs or Organizations:   . Attends Archivist Meetings:   Marland Kitchen Marital Status:      Family History: The patient's ***family history includes Arthritis in his mother; Diabetes in his maternal grandmother; Heart disease in his mother; Hypertension in his mother; Stomach cancer in his paternal grandmother. There is no history of Colon cancer, Colon polyps, Esophageal cancer, or Rectal cancer.  ROS:   Please see the history of present illness.    *** All other systems reviewed and are negative.  EKGs/Labs/Other Studies Reviewed:    The following studies were reviewed today: ***  EKG:  EKG is *** ordered today.  The ekg ordered today demonstrates ***  Recent Labs: 11/07/2019: ALT 13; BUN 7; Creat 1.12; Hemoglobin 14.2; Platelets 208; Potassium 3.7; Sodium 139  Recent Lipid Panel    Component Value Date/Time   CHOL 128 11/07/2019 1121   CHOL 127 10/24/2017 1118   TRIG 90 11/07/2019 1121   HDL 31 (L) 11/07/2019 1121   HDL 35 (L) 10/24/2017 1118   CHOLHDL 4.1 11/07/2019 1121   VLDL 21 01/26/2017 1526   LDLCALC 80 11/07/2019 1121    Physical Exam:    VS:  There were no vitals taken for this visit.    Wt Readings from Last 3 Encounters:  01/03/20 250 lb (113.4 kg)  01/23/18 262 lb (118.8 kg)  10/06/17 264 lb 3.2 oz (119.8 kg)     GEN: *** Well nourished, well developed in no acute distress HEENT: Normal NECK: No JVD; No carotid bruits LYMPHATICS: No lymphadenopathy CARDIAC: ***RRR, no murmurs, rubs, gallops RESPIRATORY:  Clear to auscultation without rales, wheezing or rhonchi  ABDOMEN: Soft, non-tender, non-distended MUSCULOSKELETAL:  No edema; No deformity  SKIN: Warm and dry NEUROLOGIC:  Alert and  oriented x 3 PSYCHIATRIC:  Normal affect   ASSESSMENT:    No diagnosis found. PLAN:    In order of problems listed above:  No diagnosis found.   Medication Adjustments/Labs and Tests Ordered: Current medicines are reviewed at length with the patient today.  Concerns regarding medicines are  outlined above.  No orders of the defined types were placed in this encounter.  No orders of the defined types were placed in this encounter.   Signed, Ledora Bottcher, PA  04/23/2020 10:05 PM    [facility]

## 2020-04-24 ENCOUNTER — Ambulatory Visit: Payer: Medicaid Other | Admitting: Physician Assistant

## 2020-05-21 ENCOUNTER — Other Ambulatory Visit: Payer: Self-pay | Admitting: Internal Medicine

## 2020-05-28 ENCOUNTER — Other Ambulatory Visit: Payer: Self-pay | Admitting: Internal Medicine

## 2020-05-28 NOTE — Telephone Encounter (Signed)
*  STAT* If patient is at the pharmacy, call can be transferred to refill team.   1. Which medications need to be refilled? (please list name of each medication and dose if known) amLODipine (NORVASC) 2.5 MG tablet  2. Which pharmacy/location (including street and city if local pharmacy) is medication to be sent to? [id number] - [place], [place] - [address]  3. Do they need a 30 day or 90 day supply? 90 day   Patient is out of medication. Patient is scheduled 08/06/2020

## 2020-05-29 ENCOUNTER — Other Ambulatory Visit: Payer: Self-pay | Admitting: Internal Medicine

## 2020-05-30 MED ORDER — AMLODIPINE BESYLATE 2.5 MG PO TABS: 2.5000 mg | ORAL_TABLET | Freq: Every day | ORAL | 3 refills | Status: DC

## 2020-05-30 NOTE — Telephone Encounter (Signed)
Follow up  Pt is calling back to follow up refill. He needs it as soon as possible

## 2020-06-24 ENCOUNTER — Other Ambulatory Visit: Payer: Self-pay | Admitting: Infectious Diseases

## 2020-06-24 DIAGNOSIS — B20 Human immunodeficiency virus [HIV] disease: Secondary | ICD-10-CM

## 2020-06-25 ENCOUNTER — Other Ambulatory Visit: Payer: Self-pay

## 2020-06-25 DIAGNOSIS — B20 Human immunodeficiency virus [HIV] disease: Secondary | ICD-10-CM

## 2020-06-25 MED ORDER — GENVOYA 150-150-200-10 MG PO TABS: ORAL_TABLET | ORAL | 0 refills | Status: DC

## 2020-06-25 MED ORDER — DARUNAVIR ETHANOLATE 800 MG PO TABS: ORAL_TABLET | ORAL | 0 refills | Status: DC

## 2020-07-19 ENCOUNTER — Other Ambulatory Visit: Payer: Self-pay | Admitting: Internal Medicine

## 2020-07-28 ENCOUNTER — Other Ambulatory Visit: Payer: Medicaid Other

## 2020-07-30 ENCOUNTER — Other Ambulatory Visit: Payer: Medicaid Other

## 2020-07-30 ENCOUNTER — Other Ambulatory Visit: Payer: Self-pay

## 2020-07-30 DIAGNOSIS — Z113 Encounter for screening for infections with a predominantly sexual mode of transmission: Secondary | ICD-10-CM | POA: Diagnosis not present

## 2020-07-30 DIAGNOSIS — B20 Human immunodeficiency virus [HIV] disease: Secondary | ICD-10-CM

## 2020-07-30 DIAGNOSIS — Z79899 Other long term (current) drug therapy: Secondary | ICD-10-CM

## 2020-07-31 LAB — T-HELPER CELL (CD4) - (RCID CLINIC ONLY)
CD4 % Helper T Cell: 12 % — ABNORMAL LOW (ref 33–65)
CD4 T Cell Abs: 291 /uL — ABNORMAL LOW (ref 400–1790)

## 2020-08-02 LAB — CBC
HCT: 43.5 % (ref 38.5–50.0)
Hemoglobin: 14 g/dL (ref 13.2–17.1)
MCH: 25.1 pg — ABNORMAL LOW (ref 27.0–33.0)
MCHC: 32.2 g/dL (ref 32.0–36.0)
MCV: 78 fL — ABNORMAL LOW (ref 80.0–100.0)
MPV: 10.2 fL (ref 7.5–12.5)
Platelets: 212 10*3/uL (ref 140–400)
RBC: 5.58 10*6/uL (ref 4.20–5.80)
RDW: 15.8 % — ABNORMAL HIGH (ref 11.0–15.0)
WBC: 5.7 10*3/uL (ref 3.8–10.8)

## 2020-08-02 LAB — LIPID PANEL
Cholesterol: 193 mg/dL (ref <200)
HDL: 31 mg/dL — ABNORMAL LOW (ref >=40)
LDL Cholesterol (Calc): 124 mg/dL (calc) — ABNORMAL HIGH
Non-HDL Cholesterol (Calc): 162 mg/dL (calc) — ABNORMAL HIGH (ref <130)
Total CHOL/HDL Ratio: 6.2 (calc) — ABNORMAL HIGH (ref <5.0)
Triglycerides: 249 mg/dL — ABNORMAL HIGH (ref <150)

## 2020-08-02 LAB — COMPREHENSIVE METABOLIC PANEL
AG Ratio: 1.3 (calc) (ref 1.0–2.5)
ALT: 13 U/L (ref 9–46)
AST: 17 U/L (ref 10–35)
Albumin: 4.1 g/dL (ref 3.6–5.1)
Alkaline phosphatase (APISO): 76 U/L (ref 35–144)
BUN: 12 mg/dL (ref 7–25)
CO2: 29 mmol/L (ref 20–32)
Calcium: 9 mg/dL (ref 8.6–10.3)
Chloride: 104 mmol/L (ref 98–110)
Creat: 0.96 mg/dL (ref 0.70–1.33)
Globulin: 3.1 g/dL (calc) (ref 1.9–3.7)
Glucose, Bld: 84 mg/dL (ref 65–99)
Potassium: 3.8 mmol/L (ref 3.5–5.3)
Sodium: 141 mmol/L (ref 135–146)
Total Bilirubin: 0.3 mg/dL (ref 0.2–1.2)
Total Protein: 7.2 g/dL (ref 6.1–8.1)

## 2020-08-02 LAB — HIV-1 RNA QUANT-NO REFLEX-BLD
HIV 1 RNA Quant: 6280 Copies/mL — ABNORMAL HIGH
HIV-1 RNA Quant, Log: 3.8 Log cps/mL — ABNORMAL HIGH

## 2020-08-02 LAB — RPR: RPR Ser Ql: NONREACTIVE

## 2020-08-04 DIAGNOSIS — I10 Essential (primary) hypertension: Secondary | ICD-10-CM | POA: Diagnosis not present

## 2020-08-04 DIAGNOSIS — G4710 Hypersomnia, unspecified: Secondary | ICD-10-CM | POA: Diagnosis not present

## 2020-08-04 DIAGNOSIS — G4733 Obstructive sleep apnea (adult) (pediatric): Secondary | ICD-10-CM | POA: Diagnosis not present

## 2020-08-06 ENCOUNTER — Other Ambulatory Visit: Payer: Self-pay

## 2020-08-06 ENCOUNTER — Encounter: Payer: Self-pay | Admitting: Internal Medicine

## 2020-08-06 ENCOUNTER — Ambulatory Visit (INDEPENDENT_AMBULATORY_CARE_PROVIDER_SITE_OTHER): Payer: Medicaid Other | Admitting: Internal Medicine

## 2020-08-06 VITALS — BP 128/62 | HR 78 | Temp 96.4°F | Ht 73.0 in | Wt 240.0 lb

## 2020-08-06 DIAGNOSIS — I10 Essential (primary) hypertension: Secondary | ICD-10-CM

## 2020-08-06 DIAGNOSIS — I25119 Atherosclerotic heart disease of native coronary artery with unspecified angina pectoris: Secondary | ICD-10-CM

## 2020-08-06 DIAGNOSIS — E785 Hyperlipidemia, unspecified: Secondary | ICD-10-CM | POA: Diagnosis not present

## 2020-08-06 MED ORDER — EZETIMIBE 10 MG PO TABS: 10.0000 mg | ORAL_TABLET | Freq: Every day | ORAL | 3 refills | Status: DC

## 2020-08-06 NOTE — Patient Instructions (Signed)
Medication Instructions:  START zetia 10mg  daily for cholesterol  Continue all other current medications  *If you need a refill on your cardiac medications before your next appointment, please call your pharmacy*   Lab Work: FASTING lipid panel in 3 months  If you have labs (blood work) drawn today and your tests are completely normal, you will receive your results only by: Marland Kitchen MyChart Message (if you have MyChart) OR . A paper copy in the mail If you have any lab test that is abnormal or we need to change your treatment, we will call you to review the results.   Testing/Procedures: NONE   Follow-Up: At [facility], you and your health needs are our priority.  As part of our continuing mission to provide you with exceptional heart care, we have created designated Provider Care Teams.  These Care Teams include your primary Cardiologist (physician) and Advanced Practice Providers (APPs -  Physician Assistants and Nurse Practitioners) who all work together to provide you with the care you need, when you need it.  We recommend signing up for the patient portal called "MyChart".  Sign up information is provided on this After Visit Summary.  MyChart is used to connect with patients for Virtual Visits (Telemedicine).  Patients are able to view lab/test results, encounter notes, upcoming appointments, etc.  Non-urgent messages can be sent to your provider as well.   To learn more about what you can do with MyChart, go to [email or web address].    Your next appointment:   12 month(s)  The format for your next appointment:   In Person  Provider:   You may see Pixie Casino, MD or one of the following Advanced Practice Providers on your designated Care Team:    Almyra Deforest, PA-C  Fabian Sharp, PA-C or   Roby Lofts, [place]    Other Instructions

## 2020-08-06 NOTE — Progress Notes (Signed)
OFFICE NOTE  Chief Complaint:  Follow-up CAD  Primary Care Physician: Charlott Rakes, MD  HPI:  Benjamin Herrera is a 54 y.o. male with a past medial history significant for a number of cardiac risk factors, who presents with upper mid-epigastric and central chest pain. Some of the episodes were worse after smoking cigarettes. He has a history of cocaine use and achalasia as well as biliary colic in the past. Troponins have been negative. EKG shows some new T wave inversions. There is a family history of early onset CAD in his mother. Based on this history I recommended a stress test. The stress test indicated a reduced LVEF of 47% with some inferior changes which were read as attenuation but concerning for ischemia. Cardiac catheterization was then recommended. He underwent left heart catheterization on 10/11/2016. This demonstrated severe two-vessel coronary disease with 100% mid to distal RCA occlusion and left to right Collaterals, as well as a 90% OM1 lesion which was successfully stented with a resolute Onyx 3 0 x 15 mm DES. He was placed on aspirin and Plavix and rosuvastatin 20 was started because of his concomitant use of a protease inhibitor. Since discharge she is doing very well. He saw Rosaria Ferries, PA-C in follow-up in December and was not having any chest pain. Hypertension was well controlled. He was not on beta blocker. Today he reports that he continues to feel well and denies any chest pain or worsening shortness of breath. Blood pressure is well-controlled 118/64.  02/24/2017  Benjamin Herrera returns today for follow-up. We readjusted his medications last time and his blood pressure is much better today 120/72. Overall he feels well denies any chest pain or worsening shortness of breath. His stent was placed in December 2017 and would need to remain on dual antiplatelet therapy at least until December 2018. He is on Crestor which is not thought to interact with his protease  inhibitors.  10/06/2017  Benjamin Herrera returns today for follow-up.  He saw Bernerd Pho, PA-C in August 2018, at the time he was noted to have an elevated total cholesterol with LDL not at goal of less than 70.  His Crestor was increased to 40 mg daily.  Lead pressure was reportedly well controlled and appears to be well controlled today at 122/64.  He denies any recurrent chest pain.  It has been close to 1 year since his stent in the OM 1.  08/06/2020  Benjamin Herrera returns today for follow-up of his coronary disease.  He denies any chest pain or worsening shortness of breath.  He reports good control of her HIV disease on protease inhibitor therapy.  He has an undetectable viral load.  His EKG shows sinus rhythm.  His blood pressure is well controlled.  Unfortunately his medications are associated with increases in his lipids and most recently his lipid profile showed total cholesterol of 193, HDL 31, LDL 124 and triglycerides 249, this in the setting of rosuvastatin 40 mg daily.  His target LDL ideally should be probably less than 70 or hopefully less than 100 on his medications.  He remains asymptomatic from a cardiac standpoint.  He reports occasionally taking extra metoprolol for a nondescript chest twinging, no more than twice a week.  He has not used nitroglycerin.  PMHx:  Past Medical History:  Diagnosis Date  . Achalasia and cardiospasm   . Allergy   . Arthritis    back ,shoulders   . Asthma   . Boils  under arms hx of  . CAD (coronary artery disease)    a. 09/2016: cath showing 100% RCA occlusion with collaterals and 90% OM1 (treated with DES)  . Candidiasis of mouth   . Candidiasis of unspecified site   . Cholelithiasis   . Cocaine abuse ([place])    Quit 09/2016  . Condyloma acuminatum in male of scrotum & anal canal s/p laser ablation 07/03/2012  . Coronary artery disease   . Dysphagia   . Fracture, humerus   . GERD (gastroesophageal reflux disease)   . Heart attack ([place])     . Hemorrhoids   . Hepatitis 1993   A  . Herpes labialis   . HIV (human immunodeficiency virus infection) ([place]) [place]  . Hypertension Dx 2014   off all bp meds for last 9 or 10 months  . Pilonidal disease 01/10/2012  . Polysubstance abuse ([place])   . Psoriasis    hx of  . Sleep apnea    does  have CPAP  . 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 BALLOON DILATION N/A 11/11/2015   Procedure: BALLOON DILATION;  Surgeon: Mauri Pole, MD;  Location: [facility];  Service: Endoscopy;  Laterality: N/A;  . BOTOX INJECTION  10/11/2012   Procedure: BOTOX INJECTION;  Surgeon: Inda Castle, MD;  [facility];  Service: Endoscopy;  Laterality: N/A;  . BOTOX INJECTION N/A 01/25/2013   Procedure: BOTOX INJECTION;  Surgeon: Inda Castle, MD;  [facility];  Service: Endoscopy;  Laterality: N/A;  . BOTOX INJECTION N/A 06/21/2013   Procedure: BOTOX INJECTION;  Surgeon: Inda Castle, MD;  [facility];  Service: Endoscopy;  Laterality: N/A;  . BOTOX INJECTION N/A 10/08/2013   Procedure: BOTOX INJECTION;  Surgeon: Inda Castle, MD;  [facility];  Service: Endoscopy;  Laterality: N/A;  . BOTOX INJECTION N/A 06/16/2015   Procedure: BOTOX INJECTION;  Surgeon: Inda Castle, MD;  [facility];  Service: Endoscopy;  Laterality: N/A;  . CARDIAC CATHETERIZATION N/A 10/11/2016   Procedure: Left Heart Cath and Coronary Angiography;  Surgeon: Burnell Blanks, MD;  mRCA 100% w/ L>R collaterals, OM1 90%  . CARDIAC CATHETERIZATION N/A 10/11/2016   Procedure: Coronary Stent Intervention;  Surgeon: Burnell Blanks, MD;  Hinda Lenis 3.5T61 DES OM1  . COLONOSCOPY  05/19/2012   Procedure: COLONOSCOPY;  Surgeon: Inda Castle, MD;  [facility];   Service: Endoscopy;  Laterality: N/A;  jill trying to contact pt to come in 0830 for 930 case, phone not accepting messages  . COLONOSCOPY    . ESOPHAGEAL MANOMETRY  05/29/2012   Procedure: ESOPHAGEAL MANOMETRY (EM);  Surgeon: Inda Castle, MD;  [facility];  Service: Endoscopy;  Laterality: N/A;  . ESOPHAGEAL MANOMETRY N/A 10/06/2015   Procedure: ESOPHAGEAL MANOMETRY (EM);  Surgeon: Mauri Pole, MD;  [facility];  Service: Endoscopy;  Laterality: N/A;  . ESOPHAGOGASTRODUODENOSCOPY  11/11/2011   Procedure: ESOPHAGOGASTRODUODENOSCOPY (EGD);  Surgeon: Inda Castle, MD;  [facility];  Service: Endoscopy;  Laterality: N/A;  botox injection  called Pt to change time of procedure per Dr Deatra Ina  . ESOPHAGOGASTRODUODENOSCOPY  03/10/2012   Procedure: ESOPHAGOGASTRODUODENOSCOPY (EGD);  Surgeon: Inda Castle, MD;  Location: [facility];  Service: Endoscopy;  Laterality: N/A;  . ESOPHAGOGASTRODUODENOSCOPY  10/11/2012   Procedure: ESOPHAGOGASTRODUODENOSCOPY (EGD);  Surgeon: Inda Castle, MD;  Location: [facility];  Service: Endoscopy;  Laterality: N/A;  . ESOPHAGOGASTRODUODENOSCOPY N/A 01/25/2013   Procedure: ESOPHAGOGASTRODUODENOSCOPY (EGD);  Surgeon: Inda Castle, MD;  Location: [facility];  Service: Endoscopy;  Laterality: N/A;  . ESOPHAGOGASTRODUODENOSCOPY N/A 06/21/2013   Procedure: ESOPHAGOGASTRODUODENOSCOPY (EGD);  Surgeon: Inda Castle, MD;  Location[facility];  Service: Endoscopy;  Laterality: N/A;  . ESOPHAGOGASTRODUODENOSCOPY (EGD) WITH PROPOFOL N/A 10/08/2013   Procedure: ESOPHAGOGASTRODUODENOSCOPY (EGD) WITH PROPOFOL;  Surgeon: Inda Castle, MD;  [facility];  Service: Endoscopy;  Laterality: N/A;  . ESOPHAGOGASTRODUODENOSCOPY (EGD) WITH PROPOFOL N/A 06/16/2015   Procedure: ESOPHAGOGASTRODUODENOSCOPY (EGD) WITH PROPOFOL;  Surgeon: Inda Castle, MD;  [facility];  Service: Endoscopy;  Laterality: N/A;  .  ESOPHAGOGASTRODUODENOSCOPY (EGD) WITH PROPOFOL N/A 11/11/2015   Procedure: ESOPHAGOGASTRODUODENOSCOPY (EGD) WITH PROPOFOL;  Surgeon: Mauri Pole, MD;  [facility];  Service: Endoscopy;  Laterality: N/A;  . EXAMINATION UNDER ANESTHESIA  07/24/2012   Procedure: EXAM UNDER ANESTHESIA;  Surgeon: Adin Hector, MD;  [facility];  Service: General;  Laterality: N/A;  . humeral fracture surgery Right yrs ago  . PILONIDAL CYST EXCISION  07/24/2012   Procedure: CYST EXCISION PILONIDAL SIMPLE;  Surgeon: Adin Hector, MD;  [facility];  Service: General;  Laterality: N/A;  Exam Under Anesthesia,, Excision Pilonidal Disease,   . right shoulder replacement  2008   x 2  . UPPER GASTROINTESTINAL ENDOSCOPY    . WART FULGURATION  07/24/2012   Procedure: FULGURATION ANAL WART;  Surgeon: Adin Hector, MD;  [facility];  Service: General;  Laterality: N/A;  excision of raphe mass    FAMHx:  Family History  Problem Relation Age of Onset  . Arthritis Mother   . Hypertension Mother   . Heart disease Mother        MI age 72  . Diabetes Maternal Grandmother   . Stomach cancer Paternal Grandmother   . Colon cancer Neg Hx   . Colon polyps Neg Hx   . Esophageal cancer Neg Hx   . Rectal cancer Neg Hx     SOCHx:   reports that he quit smoking about 4 years ago. His smoking use included cigarettes. He has a 12.50 pack-year smoking history. He has never used smokeless tobacco. He reports current drug use. Frequency: 3.00 times per week. Drugs: Marijuana and Cocaine. He reports that he does not drink alcohol.  ALLERGIES:  Allergies  Allergen Reactions  . Citrus Swelling    Mouth itching and swelling     ROS: Pertinent items noted in HPI and remainder of comprehensive ROS otherwise negative.  HOME MEDS: Current Outpatient Medications on File Prior to Visit  Medication Sig Dispense Refill  . albuterol (PROVENTIL HFA;VENTOLIN HFA) 108 (90 Base) MCG/ACT inhaler Inhale 2  puffs into the lungs every 6 (six) hours as needed for wheezing or shortness of breath. 1 Inhaler 2  . amLODipine (NORVASC) 2.5 MG tablet Take 1 tablet (2.5 mg total) by mouth daily. 90 tablet 3  . aspirin 81 MG chewable tablet Chew 1 tablet (81 mg total) by mouth daily. 90 tablet 3  . darunavir (PREZISTA) 800 MG tablet TAKE 1 TABLET BY MOUTH DAILY WITH BREAKFAST. CALL FOR JANUARY APPT. 30 tablet 0  . diclofenac sodium (VOLTAREN) 1 % GEL APPLY 2 GRAMS TOPICALLY 4 TIMES A DAY    . elvitegravir-cobicistat-emtricitabine-tenofovir (GENVOYA) 150-150-200-10 MG TABS tablet TAKE 1  TABLET BY MOUTH EVERY DAY WITH BREAKFAST 30 tablet 0  . gabapentin (NEURONTIN) 600 MG tablet Take 1 tablet (600 mg total) by mouth at bedtime. 30 tablet 3  . ibuprofen (ADVIL) 800 MG tablet Take 1 tablet (800 mg total) by mouth every 8 (eight) hours as needed for moderate pain (of shoulder. TAKE WITH FOOD). 30 tablet 0  . metoprolol succinate (TOPROL-XL) 25 MG 24 hr tablet Take 0.5 tablets (12.5 mg total) by mouth daily. 45 tablet 0  . Naphazoline-Pheniramine (OPCON-A OP) Apply 1-2 drops to eye 2 (two) times daily as needed (dry eyes.).     Marland Kitchen nitroGLYCERIN (NITROSTAT) 0.4 MG SL tablet Place 0.4 mg under the tongue every 5 (five) minutes as needed for chest pain.    . pantoprazole (PROTONIX) 40 MG tablet Take 1 tablet (40 mg total) by mouth 2 (two) times daily before a meal. (Patient taking differently: Take 40 mg by mouth daily. ) 60 tablet 6  . rosuvastatin (CRESTOR) 40 MG tablet TAKE 1 TABLET BY MOUTH EVERY DAY *PT NEEDS APPT 30 tablet 3   No current facility-administered medications on file prior to visit.    LABS/IMAGING: No results found for this or any previous visit (from the past 48 hour(s)). No results found.  WEIGHTS: Wt Readings from Last 3 Encounters:  08/06/20 240 lb (108.9 kg)  01/03/20 250 lb (113.4 kg)  01/23/18 262 lb (118.8 kg)    VITALS: BP 128/62 (BP Location: Left Arm, Patient Position: Sitting,  Cuff Size: Large)   Pulse 78   Temp (!) 96.4 F (35.8 C)   Ht 6\' 1"  (1.854 m)   Wt 240 lb (108.9 kg)   BMI 31.66 kg/m   EXAM: General appearance: alert and no distress Neck: no carotid bruit, no JVD and thyroid not enlarged, symmetric, no tenderness/mass/nodules Lungs: clear to auscultation bilaterally Heart: regular rate and rhythm, S1, S2 normal, no murmur, click, rub or gallop Abdomen: soft, non-tender; bowel sounds normal; no masses,  no organomegaly Extremities: extremities normal, atraumatic, no cyanosis or edema Pulses: 2+ and symmetric Skin: Skin color, texture, turgor normal. No rashes or lesions Neurologic: Grossly normal Psych: Pleasant  EKG: Normal sinus rhythm at 78-personally reviewed  ASSESSMENT: 1. CAD with occluded mid to distal RCA and left-to-right collaterals and status post PCI to OM1 with a 3.015 mm resolute onyx DES (09/2016) 2. Hypertension 3. Dyslipidemia 4. HIV- on protease inhibitors 5. OSA on CPAP  PLAN: 1.   Benjamin Herrera remains above target LDL on rosuvastatin max dose.  Would recommend additional therapy and add Zetia 10 mg daily.  We cannot decrease or alter his protease inhibitors.  Plan follow-up with me annually or sooner as necessary.  Pixie Casino, MD, [facility], [place] Director of the [facility]  [facility] Attending Cardiologist  Direct Dial: [phone]  Fax: [phone]  Website:  [email or web address] 08/06/2020, 1:57 PM

## 2020-08-12 ENCOUNTER — Encounter: Payer: Medicaid Other | Admitting: Infectious Diseases

## 2020-08-13 ENCOUNTER — Telehealth: Payer: Self-pay

## 2020-08-13 NOTE — Telephone Encounter (Signed)
My Chart Cabenuva Message  

## 2020-09-04 ENCOUNTER — Other Ambulatory Visit: Payer: Self-pay

## 2020-09-04 ENCOUNTER — Ambulatory Visit (INDEPENDENT_AMBULATORY_CARE_PROVIDER_SITE_OTHER): Payer: Medicaid Other | Admitting: Infectious Diseases

## 2020-09-04 ENCOUNTER — Encounter: Payer: Self-pay | Admitting: Infectious Diseases

## 2020-09-04 VITALS — BP 111/72 | HR 88 | Wt 230.0 lb

## 2020-09-04 DIAGNOSIS — Z79899 Other long term (current) drug therapy: Secondary | ICD-10-CM

## 2020-09-04 DIAGNOSIS — F1910 Other psychoactive substance abuse, uncomplicated: Secondary | ICD-10-CM

## 2020-09-04 DIAGNOSIS — A630 Anogenital (venereal) warts: Secondary | ICD-10-CM

## 2020-09-04 DIAGNOSIS — M25511 Pain in right shoulder: Secondary | ICD-10-CM | POA: Diagnosis not present

## 2020-09-04 DIAGNOSIS — B20 Human immunodeficiency virus [HIV] disease: Secondary | ICD-10-CM | POA: Diagnosis not present

## 2020-09-04 DIAGNOSIS — B370 Candidal stomatitis: Secondary | ICD-10-CM | POA: Diagnosis not present

## 2020-09-04 DIAGNOSIS — T8484XA Pain due to internal orthopedic prosthetic devices, implants and grafts, initial encounter: Secondary | ICD-10-CM | POA: Diagnosis not present

## 2020-09-04 DIAGNOSIS — Z113 Encounter for screening for infections with a predominantly sexual mode of transmission: Secondary | ICD-10-CM

## 2020-09-04 DIAGNOSIS — Z96611 Presence of right artificial shoulder joint: Secondary | ICD-10-CM

## 2020-09-04 MED ORDER — FLUCONAZOLE 100 MG PO TABS: 100.0000 mg | ORAL_TABLET | Freq: Every day | ORAL | 0 refills | Status: DC

## 2020-09-04 MED ORDER — LIDOCAINE 5 % EX OINT: 1.0000 "application " | TOPICAL_OINTMENT | CUTANEOUS | 0 refills | Status: DC | PRN

## 2020-09-04 NOTE — Progress Notes (Signed)
   Subjective:    Patient ID: Benjamin Herrera, male    DOB: 1966/05/01, 54 y.o.   MRN: [id number]  HPI 54yo M with HIV+, anal condyloma, achalasia, HTN. R shoulder pain (previous shoulder prosthesis). He still has pain from this and "I gotta get this rod out".  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 Was taking TRV/ISN, had DRVr added at his visit 06-09-12. Then changed to tivicay and complera. At his f/u 09-2015 was changed to genvoya/darunavir due to need for ppi. He underwent stress test (positive) then cath and PTCA of OM1 (09-2016). 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 He was started on beta-blocker and crestor at his CV f/u (01-21-17).  Quit taking ART for several weeks, "I'm just tired of taking pills". Got yeast infection and dysphagia. Has difficulty taking pills due to size.  Would like cabaneuva.  Has started covid vax (gotten 1).   HIV 1 RNA Quant  Date Value  07/30/2020 6,280 Copies/mL (H)  11/07/2019 366 copies/mL (H)  02/01/2019 <20 NOT DETECTED copies/mL   CD4 T Cell Abs (/uL)  Date Value  07/30/2020 291 (L)  11/07/2019 465  02/01/2019 310 (L)    Review of Systems  Constitutional: Positive for unexpected weight change. Negative for appetite change, chills and fever.  HENT: Positive for sore throat and trouble swallowing.   Respiratory: Negative for cough and shortness of breath.   Gastrointestinal: Negative for constipation and diarrhea.  Genitourinary: Negative for difficulty urinating.  Musculoskeletal: Positive for arthralgias.  Psychiatric/Behavioral: Negative for sleep disturbance.       Objective:   Physical Exam Vitals reviewed.  HENT:     Mouth/Throat:     Mouth: Mucous membranes are moist.     Pharynx: No oropharyngeal exudate or posterior oropharyngeal erythema.  Eyes:     Extraocular Movements: Extraocular movements intact.     Pupils: Pupils are equal, round, and reactive to light.  Cardiovascular:     Rate and Rhythm: Normal  rate and regular rhythm.  Pulmonary:     Effort: Pulmonary effort is normal.     Breath sounds: Normal breath sounds.  Abdominal:     General: Bowel sounds are normal. There is no distension.     Palpations: Abdomen is soft.     Tenderness: There is no abdominal tenderness.  Musculoskeletal:        General: Normal range of motion.       Arms:     Cervical back: Normal range of motion and neck supple.     Right lower leg: No edema.     Left lower leg: No edema.  Neurological:     General: No focal deficit present.     Mental Status: He is alert.  Psychiatric:        Mood and Affect: Mood normal.           Assessment & Plan:

## 2020-09-04 NOTE — Assessment & Plan Note (Signed)
States he is clean except for occas marijuana

## 2020-09-04 NOTE — Assessment & Plan Note (Signed)
I encouraged him to f/u with Dr Johney Maine.  He said the irritation has resolved and that he wouldn't.

## 2020-09-04 NOTE — Assessment & Plan Note (Addendum)
He is detectable now after being off ART.  Will give him info on cabaneuva study.  Encouraged him to take art Offered/refused condoms.  Refuses flu vax Is being sched for COVID #2. rtc in 4 months.  Will give him diflucan for 7 days.

## 2020-09-04 NOTE — Assessment & Plan Note (Signed)
States he would take out the prosthesis himself if he could.  rx for topical Encouraged him to f/u with his surgeon.

## 2020-10-25 ENCOUNTER — Other Ambulatory Visit: Payer: Self-pay | Admitting: Internal Medicine

## 2020-12-12 ENCOUNTER — Telehealth: Payer: Self-pay | Admitting: Family Medicine

## 2020-12-12 NOTE — Telephone Encounter (Signed)
Copied from [facility] [id number]. Topic: General - Other >> Dec 12, 2020  3:41 PM Alanda Slim E wrote: Reason for CRM: [employer] received a RX for cpap supplies but they sent it back to the office/provider because the signature date shows 1.19.2021/ they need the date to be on or after the order date of 8.4.2021/ please advise

## 2020-12-15 NOTE — Telephone Encounter (Signed)
Please schedule pt an appointment to discuss matter.

## 2020-12-22 NOTE — Telephone Encounter (Signed)
Patient scheduled 01/19/21 at 10:30.

## 2020-12-29 ENCOUNTER — Other Ambulatory Visit: Payer: Self-pay | Admitting: Infectious Diseases

## 2020-12-29 DIAGNOSIS — T8484XA Pain due to internal orthopedic prosthetic devices, implants and grafts, initial encounter: Secondary | ICD-10-CM

## 2020-12-29 NOTE — Telephone Encounter (Signed)
Do you want to continue refilling the lidocaine ointment for the patient?

## 2021-01-06 ENCOUNTER — Ambulatory Visit: Payer: Medicaid Other | Admitting: Infectious Diseases

## 2021-01-19 ENCOUNTER — Ambulatory Visit: Payer: Medicaid Other | Admitting: Family Medicine

## 2021-02-05 ENCOUNTER — Ambulatory Visit (INDEPENDENT_AMBULATORY_CARE_PROVIDER_SITE_OTHER): Payer: Medicaid Other | Admitting: Infectious Diseases

## 2021-02-05 ENCOUNTER — Encounter: Payer: Self-pay | Admitting: Infectious Diseases

## 2021-02-05 ENCOUNTER — Other Ambulatory Visit: Payer: Self-pay | Admitting: Infectious Diseases

## 2021-02-05 ENCOUNTER — Other Ambulatory Visit: Payer: Self-pay

## 2021-02-05 VITALS — BP 120/75 | HR 75 | Resp 16 | Ht 73.0 in | Wt 209.0 lb

## 2021-02-05 DIAGNOSIS — D0761 Carcinoma in situ of scrotum: Secondary | ICD-10-CM | POA: Diagnosis not present

## 2021-02-05 DIAGNOSIS — A630 Anogenital (venereal) warts: Secondary | ICD-10-CM | POA: Diagnosis not present

## 2021-02-05 DIAGNOSIS — B20 Human immunodeficiency virus [HIV] disease: Secondary | ICD-10-CM

## 2021-02-05 DIAGNOSIS — B370 Candidal stomatitis: Secondary | ICD-10-CM

## 2021-02-05 MED ORDER — LIDOCAINE-HYDROCORTISONE ACE 2-2 % RE KIT: 1.0000 mL | PACK | Freq: Every day | RECTAL | 3 refills | Status: DC

## 2021-02-05 MED ORDER — BIKTARVY 30-120-15 MG PO TABS: 1.0000 | ORAL_TABLET | Freq: Every day | ORAL | 3 refills | Status: AC

## 2021-02-05 MED ORDER — FLUCONAZOLE 100 MG PO TABS: 100.0000 mg | ORAL_TABLET | Freq: Every day | ORAL | 0 refills | Status: DC

## 2021-02-05 NOTE — Assessment & Plan Note (Addendum)
Will change him to [facility] with hopes that INI will overide his M184V and TFV will hyper-susceptible to 3TC and overide M184V. He would like cabaneuva but needs to get to [facility] first.   Offered/refused condoms.  Not sexually acitve Will see back in 2 months to f/u med change.

## 2021-02-05 NOTE — Telephone Encounter (Signed)
The Lidocaine-Hydrocortisone cream is not covered by the patient's insurance. Pharmacy is requesting an alternative. Please advise.  Paone T Brooks Sailors

## 2021-02-05 NOTE — Assessment & Plan Note (Signed)
He is going to f/u with Dr Johney Maine.

## 2021-02-05 NOTE — Assessment & Plan Note (Signed)
Will refill his diflucan at his request.  None seen on exam.

## 2021-02-05 NOTE — Progress Notes (Signed)
   Subjective:    Patient ID: Benjamin Herrera, male  DOB: [birth year], 55 y.o.        MRN: [id number]   HPI 54yo M with HIV+, anal condyloma, achalasia, HTN. R shoulder pain (previous shoulder prosthesis). He still has pain from this and "I gotta get this rod out".  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 Was taking TRV/ISN, had DRVr added at his visit 06-09-12. Then changed to tivicay and complera. At his f/u 09-2015 was changed to genvoya/darunavir due to need for ppi. He underwent stress test (positive) then cath and PTCA of OM1(09-2016). 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 He was started on beta-blocker and crestor at his CV f/u (01-21-17).  Today has been off ART for 3 months as his ART sticks in his throat- pills too big.  As lost ~ 40# Still having shoulder pain. Is planning to have repair in 2 months.  C/o irritation on his back.   HIV 1 RNA Quant  Date Value  07/30/2020 6,280 Copies/mL (H)  11/07/2019 366 copies/mL (H)  02/01/2019 <20 NOT DETECTED copies/mL   CD4 T Cell Abs (/uL)  Date Value  07/30/2020 291 (L)  11/07/2019 465  02/01/2019 310 (L)   Got COVID vax at [facility].   Health Maintenance  Topic Date Due  . COVID-19 Vaccine (3 - Pfizer risk 4-dose series) 07/23/2020  . TETANUS/TDAP  03/16/2021  . INFLUENZA VACCINE  05/25/2021  . COLONOSCOPY (Pts 45-84yrs Insurance coverage will need to be confirmed)  05/19/2022  . Hepatitis C Screening  Completed  . HIV Screening  Completed  . HPV VACCINES  Aged Out    Review of Systems  Constitutional: Positive for weight loss. Negative for chills and fever.  Respiratory: Negative for cough and shortness of breath.   Gastrointestinal: Positive for diarrhea. Negative for constipation.  Genitourinary: Negative for dysuria.  Psychiatric/Behavioral: The patient has insomnia.     Please see HPI. All other systems reviewed and negative.     Objective:  Physical Exam Vitals reviewed.   Constitutional:      Appearance: Normal appearance.  HENT:     Mouth/Throat:     Mouth: Mucous membranes are moist.     Pharynx: No oropharyngeal exudate.  Eyes:     Extraocular Movements: Extraocular movements intact.     Pupils: Pupils are equal, round, and reactive to light.  Cardiovascular:     Rate and Rhythm: Normal rate and regular rhythm.  Pulmonary:     Effort: Pulmonary effort is normal.     Breath sounds: Normal breath sounds.  Abdominal:     General: Abdomen is flat. Bowel sounds are normal. There is no distension.     Tenderness: There is no abdominal tenderness.  Musculoskeletal:        General: Normal range of motion.     Cervical back: Normal range of motion and neck supple.     Right lower leg: No edema.     Left lower leg: No edema.  Skin:    Findings: Lesion (wound over midline of sacrum from previous surgery. no fluctuance. ) present.  Neurological:     Mental Status: He is alert.            Assessment & Plan:

## 2021-02-06 ENCOUNTER — Other Ambulatory Visit: Payer: Self-pay | Admitting: Pharmacist Clinician (PhC)/ Clinical Pharmacy Specialist

## 2021-02-06 ENCOUNTER — Other Ambulatory Visit (HOSPITAL_COMMUNITY): Payer: Self-pay | Admitting: Pharmacist Clinician (PhC)/ Clinical Pharmacy Specialist

## 2021-02-06 MED ORDER — HYDROCORTISONE ACE-PRAMOXINE 1-1 % EX CREA: 1.0000 "application " | TOPICAL_CREAM | Freq: Two times a day (BID) | CUTANEOUS | 3 refills | Status: DC

## 2021-02-06 MED ORDER — HYDROCORTISONE 2.5 % EX OINT: TOPICAL_OINTMENT | Freq: Two times a day (BID) | CUTANEOUS | 2 refills | Status: DC

## 2021-02-06 NOTE — Addendum Note (Signed)
Addended by: Dinah Beers on: 02/06/2021 02:09 PM   Modules accepted: Orders

## 2021-03-10 DIAGNOSIS — M79642 Pain in left hand: Secondary | ICD-10-CM | POA: Diagnosis not present

## 2021-03-10 DIAGNOSIS — M1812 Unilateral primary osteoarthritis of first carpometacarpal joint, left hand: Secondary | ICD-10-CM | POA: Diagnosis not present

## 2021-04-01 ENCOUNTER — Other Ambulatory Visit: Payer: Self-pay | Admitting: Internal Medicine

## 2021-04-07 ENCOUNTER — Ambulatory Visit (INDEPENDENT_AMBULATORY_CARE_PROVIDER_SITE_OTHER): Payer: Medicaid Other | Admitting: Infectious Diseases

## 2021-04-07 ENCOUNTER — Other Ambulatory Visit: Payer: Self-pay

## 2021-04-07 VITALS — BP 141/78 | HR 78 | Temp 98.0°F | Resp 16 | Ht 73.0 in

## 2021-04-07 DIAGNOSIS — J4520 Mild intermittent asthma, uncomplicated: Secondary | ICD-10-CM

## 2021-04-07 DIAGNOSIS — Z96611 Presence of right artificial shoulder joint: Secondary | ICD-10-CM | POA: Diagnosis not present

## 2021-04-07 DIAGNOSIS — F5101 Primary insomnia: Secondary | ICD-10-CM | POA: Diagnosis not present

## 2021-04-07 DIAGNOSIS — M65312 Trigger thumb, left thumb: Secondary | ICD-10-CM | POA: Diagnosis not present

## 2021-04-07 DIAGNOSIS — B20 Human immunodeficiency virus [HIV] disease: Secondary | ICD-10-CM

## 2021-04-07 DIAGNOSIS — M79644 Pain in right finger(s): Secondary | ICD-10-CM | POA: Diagnosis not present

## 2021-04-07 DIAGNOSIS — M1812 Unilateral primary osteoarthritis of first carpometacarpal joint, left hand: Secondary | ICD-10-CM | POA: Diagnosis not present

## 2021-04-07 DIAGNOSIS — C469 Kaposi's sarcoma, unspecified: Secondary | ICD-10-CM

## 2021-04-07 DIAGNOSIS — I200 Unstable angina: Secondary | ICD-10-CM

## 2021-04-07 DIAGNOSIS — T8484XA Pain due to internal orthopedic prosthetic devices, implants and grafts, initial encounter: Secondary | ICD-10-CM | POA: Diagnosis not present

## 2021-04-07 DIAGNOSIS — M79642 Pain in left hand: Secondary | ICD-10-CM | POA: Diagnosis not present

## 2021-04-07 MED ORDER — ALBUTEROL SULFATE HFA 108 (90 BASE) MCG/ACT IN AERS: 2.0000 | INHALATION_SPRAY | Freq: Four times a day (QID) | RESPIRATORY_TRACT | 2 refills | Status: AC | PRN

## 2021-04-07 MED ORDER — NITROGLYCERIN 0.4 MG SL SUBL: 0.4000 mg | SUBLINGUAL_TABLET | SUBLINGUAL | 3 refills | Status: DC | PRN

## 2021-04-07 MED ORDER — PANTOPRAZOLE SODIUM 40 MG PO TBEC: 40.0000 mg | DELAYED_RELEASE_TABLET | Freq: Two times a day (BID) | ORAL | 6 refills | Status: DC

## 2021-04-07 NOTE — Assessment & Plan Note (Signed)
He is in sling today Will f/u with ortho.

## 2021-04-07 NOTE — Assessment & Plan Note (Signed)
Will have him f/u with PCP. He was prev given [place].

## 2021-04-07 NOTE — Assessment & Plan Note (Signed)
Will check HIV RNA and CD4 today.  Appears to be tolerating his bictegravir without issue.  Will see him back in 2-3 months.  Offered/refused condoms.

## 2021-04-07 NOTE — Assessment & Plan Note (Signed)
Albuterol inh refilled.

## 2021-04-07 NOTE — Assessment & Plan Note (Signed)
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

## 2021-04-07 NOTE — Progress Notes (Signed)
   Subjective:    Patient ID: Particia Herrera, male  DOB: 05/12/1966, 55 y.o.        MRN: [id number]   HPI 55 yo M with HIV+, anal condyloma, achalasia, HTN. R shoulder pain (previous shoulder prosthesis).  He still has pain from this and "I gotta get this rod out".  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 Was taking TRV/ISN, had DRVr added at his visit 06-09-12. Then changed to tivicay and complera. At his f/u 09-2015 was changed to genvoya/darunavir due to need for ppi.  He underwent stress test (positive) then cath and PTCA of OM1 (09-2016). 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 He was started on beta-blocker and crestor at his CV f/u (01-21-17). Today c/o pain in his R shoulder- I have to wait til September for repair of this.  He also c/o L hand pain- was seen by hand this AM and has splint on. Was told he has arthritis.  Medications reviewed with pt.  No problems with his ART (biktarvy).   Has appt 6-16 for "balloon dilation of my esophagus" because of n/v, wt loss.   HIV 1 RNA Quant  Date Value  07/30/2020 6,280 Copies/mL (H)  11/07/2019 366 copies/mL (H)  02/01/2019 <20 NOT DETECTED copies/mL   CD4 T Cell Abs (/uL)  Date Value  07/30/2020 291 (L)  11/07/2019 465  02/01/2019 310 (L)     Health Maintenance  Topic Date Due   Pneumococcal Vaccine 17-22 Years old (1 - PCV) Never done   Zoster Vaccines- Shingrix (1 of 2) Never done   COVID-19 Vaccine (3 - Pfizer risk series) 07/23/2020   TETANUS/TDAP  03/16/2021   INFLUENZA VACCINE  05/25/2021   COLONOSCOPY (Pts 45-33yrs Insurance coverage will need to be confirmed)  05/19/2022   Hepatitis C Screening  Completed   HIV Screening  Completed   HPV VACCINES  Aged Out      Review of Systems  Constitutional:  Positive for weight loss. Negative for malaise/fatigue.  Gastrointestinal:  Positive for abdominal pain and vomiting. Negative for constipation and diarrhea.  Genitourinary:  Negative for dysuria.   Musculoskeletal:  Positive for joint pain and myalgias.   Please see HPI. All other systems reviewed and negative.     Objective:  Physical Exam Vitals reviewed.  Constitutional:      Appearance: Normal appearance.  HENT:     Mouth/Throat:     Mouth: Mucous membranes are moist.     Pharynx: No oropharyngeal exudate.  Cardiovascular:     Rate and Rhythm: Normal rate and regular rhythm.  Pulmonary:     Effort: Pulmonary effort is normal.     Breath sounds: Normal breath sounds.  Abdominal:     General: Bowel sounds are normal. There is no distension.     Palpations: Abdomen is soft.     Tenderness: There is no abdominal tenderness.  Musculoskeletal:     Cervical back: Normal range of motion and neck supple.     Right lower leg: No edema.     Left lower leg: No edema.  Skin:    Findings: Lesion (lesions on L calf, raised, firm.) present.  Neurological:     General: No focal deficit present.     Mental Status: He is alert.         Assessment & Plan:

## 2021-04-07 NOTE — Assessment & Plan Note (Signed)
Has not used NTG recently but asks for refill.

## 2021-04-08 ENCOUNTER — Ambulatory Visit: Payer: Medicaid Other | Admitting: Gastroenterology

## 2021-04-08 LAB — T-HELPER CELL (CD4) - (RCID CLINIC ONLY)
CD4 % Helper T Cell: 10 % — ABNORMAL LOW (ref 33–65)
CD4 T Cell Abs: 318 /uL — ABNORMAL LOW (ref 400–1790)

## 2021-04-09 LAB — HIV-1 RNA QUANT-NO REFLEX-BLD
HIV 1 RNA Quant: 27 Copies/mL — ABNORMAL HIGH
HIV-1 RNA Quant, Log: 1.43 Log cps/mL — ABNORMAL HIGH

## 2021-06-01 ENCOUNTER — Telehealth: Payer: Self-pay | Admitting: Family Medicine

## 2021-06-01 NOTE — Telephone Encounter (Signed)
..  Patient declines further follow up and engagement by the Managed Medicaid Team. Appropriate care team members and provider have been notified via electronic communication. The Managed Medicaid Team is available to follow up with the patient after provider conversation with the patient regarding recommendation for engagement and subsequent re-referral to the Managed Medicaid Team.    Jennifer Alley Care Guide, High Risk Medicaid Managed Care Embedded Care Coordination [place]  [facility]   

## 2021-06-11 ENCOUNTER — Other Ambulatory Visit: Payer: Self-pay | Admitting: Internal Medicine

## 2021-07-09 ENCOUNTER — Ambulatory Visit: Payer: Medicaid Other | Admitting: Infectious Diseases

## 2021-10-20 ENCOUNTER — Ambulatory Visit: Payer: Self-pay

## 2021-10-20 NOTE — Telephone Encounter (Signed)
Office hours for [facility] were given to patient.  He is aware can arrive for an office visit.  Patient is aware.

## 2021-10-20 NOTE — Telephone Encounter (Signed)
°  Chief Complaint: Left pain Symptoms: Pain Frequency: Started 2 weeks ago Pertinent Negatives: Patient denies Swelling Disposition: [] ED /[] Urgent Care (no appt availability in office) / [] Appointment(In office/virtual)/ []  [facility]/ [] Home Care/ [] Refused Recommended Disposition  Additional Notes: Pt. Asking to be worked in. Please advise.    Answer Assessment - Initial Assessment Questions 1. LOCATION and RADIATION: "Where is the pain located?"      Left knee 2. QUALITY: "What does the pain feel like?"  (e.g., sharp, dull, aching, burning)     Sharp 3. SEVERITY: "How bad is the pain?" "What does it keep you from doing?"   (Scale 1-10; or mild, moderate, severe)   -  MILD (1-3): doesn't interfere with normal activities    -  MODERATE (4-7): interferes with normal activities (e.g., work or school) or awakens from sleep, limping    -  SEVERE (8-10): excruciating pain, unable to do any normal activities, unable to walk     Now - 10 with walking 4. ONSET: "When did the pain start?" "Does it come and go, or is it there all the time?"     2 weeks ago 5. RECURRENT: "Have you had this pain before?" If Yes, ask: "When, and what happened then?"     No 6. SETTING: "Has there been any recent work, exercise or other activity that involved that part of the body?"      No 7. AGGRAVATING FACTORS: "What makes the knee pain worse?" (e.g., walking, climbing stairs, running)     Walking 8. ASSOCIATED SYMPTOMS: "Is there any swelling or redness of the knee?"     No 9. OTHER SYMPTOMS: "Do you have any other symptoms?" (e.g., chest pain, difficulty breathing, fever, calf pain)     No 10. PREGNANCY: "Is there any chance you are pregnant?" "When was your last menstrual period?"       N/a  Protocols used: Knee Pain-A-AH

## 2021-10-21 ENCOUNTER — Other Ambulatory Visit: Payer: Self-pay

## 2021-10-21 ENCOUNTER — Ambulatory Visit: Payer: Medicaid Other | Admitting: Physician Assistant

## 2021-10-21 VITALS — BP 132/77 | HR 75 | Temp 98.2°F | Ht 73.5 in | Wt 226.0 lb

## 2021-10-21 DIAGNOSIS — M25562 Pain in left knee: Secondary | ICD-10-CM

## 2021-10-21 MED ORDER — MELOXICAM 7.5 MG PO TABS: 7.5000 mg | ORAL_TABLET | Freq: Every day | ORAL | 0 refills | Status: DC

## 2021-10-21 NOTE — Progress Notes (Signed)
Established Patient Office Visit  Subjective:  Patient ID: Benjamin Herrera, male    DOB: 10/13/1966  Age: 55 y.o. MRN: [id number]  CC:  Chief Complaint  Patient presents with   Knee Pain    HPI Benjamin Herrera states that he has been having pain in his left knee for the past 2 weeks.  Denies injury or trauma. States that he had left knee pain before about a year ago, but does endorse that it is much worse this time.  States it is hard to sit, stand and walk.  States it is not tender to the touch, denies swelling, numbness or tingling.  States that he had gabapentin, is unsure of dose, states that he tried that without any pain relief.  Reports that he has also used Tylenol and Aleve without relief.  Reports that he does not take Protonix on a daily basis, only uses it as needed.   Past Medical History:  Diagnosis Date   Achalasia and cardiospasm    Allergy    Arthritis    back ,shoulders    Asthma    Boils    under arms hx of   CAD (coronary artery disease)    a. 09/2016: cath showing 100% RCA occlusion with collaterals and 90% OM1 (treated with DES)   Candidiasis of mouth    Candidiasis of unspecified site    Cholelithiasis    Cocaine abuse ([place])    Quit 09/2016   Condyloma acuminatum in male of scrotum & anal canal s/p laser ablation 07/03/2012   Coronary artery disease    Dysphagia    Fracture, humerus    GERD (gastroesophageal reflux disease)    Heart attack ([place])    Hemorrhoids    Hepatitis 1993   A   Herpes labialis    HIV (human immunodeficiency virus infection) ([place]) [place]   Hypertension Dx 2014   off all bp meds for last 9 or 10 months   Pilonidal disease 01/10/2012   Polysubstance abuse ([place])    Psoriasis    hx of   Sleep apnea    does  have CPAP   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/03/2012   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 08/03/2012    Past Surgical History:  Procedure  Laterality Date   BALLOON DILATION N/A 11/11/2015   Procedure: BALLOON DILATION;  Surgeon: Mauri Pole, MD;  Location: [facility];  Service: Endoscopy;  Laterality: N/A;   BOTOX INJECTION  10/11/2012   Procedure: BOTOX INJECTION;  Surgeon: Inda Castle, MD;  [facility];  Service: Endoscopy;  Laterality: N/A;   BOTOX INJECTION N/A 01/25/2013   Procedure: BOTOX INJECTION;  Surgeon: Inda Castle, MD;  [facility];  Service: Endoscopy;  Laterality: N/A;   BOTOX INJECTION N/A 06/21/2013   Procedure: BOTOX INJECTION;  Surgeon: Inda Castle, MD;  [facility];  Service: Endoscopy;  Laterality: N/A;   BOTOX INJECTION N/A 10/08/2013   Procedure: BOTOX INJECTION;  Surgeon: Inda Castle, MD;  [facility];  Service: Endoscopy;  Laterality: N/A;   BOTOX INJECTION N/A 06/16/2015   Procedure: BOTOX INJECTION;  Surgeon: Inda Castle, MD;  [facility];  Service: Endoscopy;  Laterality: N/A;   CARDIAC CATHETERIZATION N/A 10/11/2016   Procedure: Left Heart Cath and Coronary Angiography;  Surgeon: Burnell Blanks, MD;  mRCA 100% w/ L>R collaterals, OM1 90%  CARDIAC CATHETERIZATION N/A 10/11/2016   Procedure: Coronary Stent Intervention;  Surgeon: Burnell Blanks, MD;  RESOLUTE ONYX 3.5X15 DES OM1   COLONOSCOPY  05/19/2012   Procedure: COLONOSCOPY;  Surgeon: Inda Castle, MD;  [facility];  Service: Endoscopy;  Laterality: N/A;  jill trying to contact pt to come in 0830 for 930 case, phone not accepting messages   COLONOSCOPY     ESOPHAGEAL MANOMETRY  05/29/2012   Procedure: ESOPHAGEAL MANOMETRY (EM);  Surgeon: Inda Castle, MD;  [facility];  Service: Endoscopy;  Laterality: N/A;   ESOPHAGEAL MANOMETRY N/A 10/06/2015   Procedure: ESOPHAGEAL MANOMETRY (EM);  Surgeon: Mauri Pole, MD;  [facility];  Service: Endoscopy;  Laterality: N/A;   ESOPHAGOGASTRODUODENOSCOPY  11/11/2011    Procedure: ESOPHAGOGASTRODUODENOSCOPY (EGD);  Surgeon: Inda Castle, MD;  [facility];  Service: Endoscopy;  Laterality: N/A;  botox injection  called Pt to change time of procedure per Dr Deatra Ina   ESOPHAGOGASTRODUODENOSCOPY  03/10/2012   Procedure: ESOPHAGOGASTRODUODENOSCOPY (EGD);  Surgeon: Inda Castle, MD;  Location: [facility];  Service: Endoscopy;  Laterality: N/A;   ESOPHAGOGASTRODUODENOSCOPY  10/11/2012   Procedure: ESOPHAGOGASTRODUODENOSCOPY (EGD);  Surgeon: Inda Castle, MD;  Location: [facility];  Service: Endoscopy;  Laterality: N/A;   ESOPHAGOGASTRODUODENOSCOPY N/A 01/25/2013   Procedure: ESOPHAGOGASTRODUODENOSCOPY (EGD);  Surgeon: Inda Castle, MD;  Location: [facility];  Service: Endoscopy;  Laterality: N/A;   ESOPHAGOGASTRODUODENOSCOPY N/A 06/21/2013   Procedure: ESOPHAGOGASTRODUODENOSCOPY (EGD);  Surgeon: Inda Castle, MD;  Location[facility];  Service: Endoscopy;  Laterality: N/A;   ESOPHAGOGASTRODUODENOSCOPY (EGD) WITH PROPOFOL N/A 10/08/2013   Procedure: ESOPHAGOGASTRODUODENOSCOPY (EGD) WITH PROPOFOL;  Surgeon: Inda Castle, MD;  [facility];  Service: Endoscopy;  Laterality: N/A;   ESOPHAGOGASTRODUODENOSCOPY (EGD) WITH PROPOFOL N/A 06/16/2015   Procedure: ESOPHAGOGASTRODUODENOSCOPY (EGD) WITH PROPOFOL;  Surgeon: Inda Castle, MD;  [facility];  Service: Endoscopy;  Laterality: N/A;   ESOPHAGOGASTRODUODENOSCOPY (EGD) WITH PROPOFOL N/A 11/11/2015   Procedure: ESOPHAGOGASTRODUODENOSCOPY (EGD) WITH PROPOFOL;  Surgeon: Mauri Pole, MD;  [facility];  Service: Endoscopy;  Laterality: N/A;   EXAMINATION UNDER ANESTHESIA  07/24/2012   Procedure: EXAM UNDER ANESTHESIA;  Surgeon: Adin Hector, MD;  [facility];  Service: General;  Laterality: N/A;   humeral fracture surgery Right yrs ago   PILONIDAL CYST EXCISION  07/24/2012   Procedure: CYST EXCISION PILONIDAL SIMPLE;  Surgeon: Adin Hector, MD;   [facility];  Service: General;  Laterality: N/A;  Exam Under Anesthesia,, Excision Pilonidal Disease,    right shoulder replacement  2008   x 2   UPPER GASTROINTESTINAL ENDOSCOPY     WART FULGURATION  07/24/2012   Procedure: FULGURATION ANAL WART;  Surgeon: Adin Hector, MD;  [facility]: [facility];  Service: General;  Laterality: N/A;  excision of raphe mass    Family History  Problem Relation Age of Onset   Arthritis Mother    Hypertension Mother    Heart disease Mother        MI age 98   Diabetes Maternal Grandmother    Stomach cancer Paternal Grandmother    Colon cancer Neg Hx    Colon polyps Neg Hx    Esophageal cancer Neg Hx    Rectal cancer Neg Hx     Social History   Socioeconomic History   Marital status: Single    Spouse name: Not on file   Number of children: Not on file   Years  of education: Not on file   Highest education level: Not on file  Occupational History   Occupation: Disabled  Tobacco Use   Smoking status: Every Day    Packs/day: 0.25    Years: 25.00    Pack years: 6.25    Types: Cigarettes   Smokeless tobacco: Never  Substance and Sexual Activity   Alcohol use: No    Alcohol/week: 0.0 standard drinks   Drug use: Yes    Frequency: 3.0 times per week    Types: Marijuana, Cocaine   Sexual activity: Not Currently    Partners: Male    Comment: pt. given condoms, used marijuana -3 days ago 05/14/16  Other Topics Concern   Not on file  Social History Narrative   Pt lives in a ground floor apartment in [place].   Social Determinants of Health   Financial Resource Strain: Not on file  Food Insecurity: Not on file  Transportation Needs: Not on file  Physical Activity: Not on file  Stress: Not on file  Social Connections: Not on file  Intimate Partner Violence: Not on file    Outpatient Medications Prior to Visit  Medication Sig Dispense Refill   albuterol (VENTOLIN HFA) 108 (90 Base) MCG/ACT inhaler Inhale 2 puffs into the lungs every 6  (six) hours as needed for wheezing or shortness of breath. 1 each 2   amLODipine (NORVASC) 2.5 MG tablet TAKE 1 TABLET BY MOUTH EVERY DAY 90 tablet 1   aspirin 81 MG chewable tablet Chew 1 tablet (81 mg total) by mouth daily. 90 tablet 3   darunavir (PREZISTA) 800 MG tablet Take by mouth.     hydrocortisone 2.5 % ointment Apply topically 2 (two) times daily. Apply around rectal area. 30 g 2   lidocaine (XYLOCAINE) 5 % ointment Apply 1 application topically as needed. 35.44 g 0   metoprolol succinate (TOPROL-XL) 25 MG 24 hr tablet TAKE 1/2 TABLET BY MOUTH EVERY DAY 45 tablet 2   Naphazoline-Pheniramine (OPCON-A OP) Apply 1-2 drops to eye 2 (two) times daily as needed (dry eyes.).      nitroGLYCERIN (NITROSTAT) 0.4 MG SL tablet Place 1 tablet (0.4 mg total) under the tongue every 5 (five) minutes as needed for chest pain. 3 tablet 3   pantoprazole (PROTONIX) 40 MG tablet Take 1 tablet (40 mg total) by mouth 2 (two) times daily before a meal. 60 tablet 6   rosuvastatin (CRESTOR) 40 MG tablet TAKE 1 TABLET BY MOUTH EVERY DAY *PT NEEDS APPT 30 tablet 3   BIKTARVY 30-120-15 MG TABS Take 1 tablet by mouth daily.     ezetimibe (ZETIA) 10 MG tablet Take 1 tablet (10 mg total) by mouth daily. 90 tablet 3   No facility-administered medications prior to visit.    Allergies  Allergen Reactions   Citrus Swelling    Mouth itching and swelling     ROS Review of Systems  Constitutional: Negative.   HENT: Negative.    Eyes: Negative.   Respiratory:  Negative for cough.   Cardiovascular:  Negative for chest pain.  Gastrointestinal: Negative.   Endocrine: Negative.   Genitourinary: Negative.   Musculoskeletal:  Positive for arthralgias. Negative for joint swelling.  Skin: Negative.   Allergic/Immunologic: Negative.   Neurological: Negative.   Hematological: Negative.   Psychiatric/Behavioral: Negative.       Objective:    Physical Exam Vitals and nursing note reviewed.  Constitutional:       Appearance: Normal appearance.  HENT:  Head: Normocephalic and atraumatic.     Right Ear: External ear normal.     Left Ear: External ear normal.     Nose: Nose normal.     Mouth/Throat:     Mouth: Mucous membranes are moist.     Pharynx: Oropharynx is clear.  Eyes:     Extraocular Movements: Extraocular movements intact.     Conjunctiva/sclera: Conjunctivae normal.     Pupils: Pupils are equal, round, and reactive to light.  Cardiovascular:     Rate and Rhythm: Normal rate and regular rhythm.     Pulses: Normal pulses.     Heart sounds: Normal heart sounds.  Pulmonary:     Effort: Pulmonary effort is normal.     Breath sounds: Normal breath sounds.  Musculoskeletal:     Cervical back: Normal range of motion and neck supple.     Right knee: Normal.     Left knee: No swelling, bony tenderness or crepitus. Normal range of motion. No tenderness.  Skin:    General: Skin is warm and dry.  Neurological:     General: No focal deficit present.     Mental Status: He is alert and oriented to person, place, and time.  Psychiatric:        Mood and Affect: Mood normal.        Behavior: Behavior normal.        Thought Content: Thought content normal.        Judgment: Judgment normal.    BP 132/77 (BP Location: Left Arm, Patient Position: Sitting, Cuff Size: Normal)    Pulse 75    Temp 98.2 F (36.8 C) (Oral)    Ht 6' 1.5" (1.867 m)    Wt 226 lb (102.5 kg)    BMI 29.41 kg/m  Wt Readings from Last 3 Encounters:  10/21/21 226 lb (102.5 kg)  02/05/21 209 lb (94.8 kg)  09/04/20 230 lb (104.3 kg)     Health Maintenance Due  Topic Date Due   Zoster Vaccines- Shingrix (1 of 2) Never done   Pneumococcal Vaccine 69-4 Years old (3 - PCV) 10/03/2012   COVID-19 Vaccine (3 - Pfizer risk series) 07/23/2020   TETANUS/TDAP  03/16/2021   INFLUENZA VACCINE  05/25/2021    There are no preventive care reminders to display for this patient.  No results found for: TSH Lab Results  Component  Value Date   WBC 5.7 07/30/2020   HGB 14.0 07/30/2020   HCT 43.5 07/30/2020   MCV 78.0 (L) 07/30/2020   PLT 212 07/30/2020   Lab Results  Component Value Date   NA 141 07/30/2020   K 3.8 07/30/2020   CO2 29 07/30/2020   GLUCOSE 84 07/30/2020   BUN 12 07/30/2020   CREATININE 0.96 07/30/2020   BILITOT 0.3 07/30/2020   ALKPHOS 88 10/24/2017   AST 17 07/30/2020   ALT 13 07/30/2020   PROT 7.2 07/30/2020   ALBUMIN 4.2 10/24/2017   CALCIUM 9.0 07/30/2020   ANIONGAP 10 05/02/2017   Lab Results  Component Value Date   CHOL 193 07/30/2020   Lab Results  Component Value Date   HDL 31 (L) 07/30/2020   Lab Results  Component Value Date   LDLCALC 124 (H) 07/30/2020   Lab Results  Component Value Date   TRIG 249 (H) 07/30/2020   Lab Results  Component Value Date   CHOLHDL 6.2 (H) 07/30/2020   Lab Results  Component Value Date   HGBA1C 5.9 (H) 10/08/2016  Assessment & Plan:   Problem List Items Addressed This Visit   None Visit Diagnoses     Acute pain of left knee    -  Primary   Relevant Medications   meloxicam (MOBIC) 7.5 MG tablet   Other Relevant Orders   DG Knee Complete 4 Views Left       Meds ordered this encounter  Medications   meloxicam (MOBIC) 7.5 MG tablet    Sig: Take 1 tablet (7.5 mg total) by mouth daily.    Dispense:  30 tablet    Refill:  0    Order Specific Question:   Supervising Provider    Answer:   Joya Gaskins, PATRICK E [[id number]]  1. Acute pain of left knee Trial Mobic, patient education given on supportive care, patient encouraged to use Protonix for stomach protection.  Red flags given for prompt reevaluation. - DG Knee Complete 4 Views Left; Future - meloxicam (MOBIC) 7.5 MG tablet; Take 1 tablet (7.5 mg total) by mouth daily.  Dispense: 30 tablet; Refill: 0    I have reviewed the patient's medical history (PMH, PSH, Social History, Family History, Medications, and allergies) , and have been updated if relevant. I spent 20 minutes  reviewing chart and  face to face time with patient.    Follow-up: Return if symptoms worsen or fail to improve.    Loraine Grip Mayers, PA-C

## 2021-10-21 NOTE — Progress Notes (Signed)
Patient has eaten and taken medication today. Patient reports L knee pain being present for the past 3 weeks with increase in the past week. Patient denies swelling, sharp pain and throbbing. Patient reports gabapentin has provided no relief.

## 2021-10-21 NOTE — Patient Instructions (Addendum)
You can use Mobic as needed to help with your knee pain, make sure that you are using pantoprazole if you start to experience any heartburn or acid reflux.  I also encourage you to use ice, and bracing to help with your knee pain.  We will call you with the results of your x-ray.  Benjamin Rad, PA-C Physician Assistant [facility] Medicine [email or web address]   Acute Knee Pain, Adult Acute knee pain is sudden and may be caused by damage, swelling, or irritation of the muscles and tissues that support the knee. Pain may result from: A fall. An injury to the knee from twisting motions. A hit to the knee. Infection. Acute knee pain may go away on its own with time and rest. If it does not, your health care provider may order tests to find the cause of the pain. These may include: Imaging tests, such as an X-ray, MRI, CT scan, or ultrasound. Joint aspiration. In this test, fluid is removed from the knee and evaluated. Arthroscopy. In this test, a lighted tube is inserted into the knee and an image is projected onto a TV screen. Biopsy. In this test, a sample of tissue is removed from the body and studied under a microscope. Follow these instructions at home: If you have a knee sleeve or brace:  Wear the knee sleeve or brace as told by your health care provider. Remove it only as told by your health care provider. Loosen it if your toes tingle, become numb, or turn cold and blue. Keep it clean. If the knee sleeve or brace is not waterproof: Do not let it get wet. Cover it with a watertight covering when you take a bath or shower. Activity Rest your knee. Do not do things that cause pain or make pain worse. Avoid high-impact activities or exercises, such as running, jumping rope, or doing jumping jacks. Work with a physical therapist to make a safe exercise program, as recommended by your health care provider. Do exercises as told by your  physical therapist. Managing pain, stiffness, and swelling  If directed, put ice on the affected knee. To do this: If you have a removable knee sleeve or brace, remove it as told by your health care provider. Put ice in a plastic bag. Place a towel between your skin and the bag. Leave the ice on for 20 minutes, 2-3 times a day. Remove the ice if your skin turns bright red. This is very important. If you cannot feel pain, heat, or cold, you have a greater risk of damage to the area. If directed, use an elastic bandage to put pressure (compression) on your injured knee. This may control swelling, give support, and help with discomfort. Raise (elevate) your knee above the level of your heart while you are sitting or lying down. Sleep with a pillow under your knee. General instructions Take over-the-counter and prescription medicines only as told by your health care provider. Do not use any products that contain nicotine or tobacco, such as cigarettes, e-cigarettes, and chewing tobacco. If you need help quitting, ask your health care provider. If you are overweight, work with your health care provider and a dietitian to set a weight-loss goal that is healthy and reasonable for you. Extra weight can put pressure on your knee. Pay attention to any changes in your symptoms. Keep all follow-up visits. This is important. Contact a health care provider if: Your knee pain continues, changes, or gets worse. You have a fever along  with knee pain. Your knee feels warm to the touch or is red. Your knee buckles or locks up. Get help right away if: Your knee swells, and the swelling becomes worse. You cannot move your knee. You have severe pain in your knee that cannot be managed with pain medicine. Summary Acute knee pain can be caused by a fall, an injury, an infection, or damage, swelling, or irritation of the tissues that support your knee. Your health care provider may perform tests to find out the  cause of the pain. Pay attention to any changes in your symptoms. Relieve your pain with rest, medicines, light activity, and the use of ice. Get help right away if your knee swells, you cannot move your knee, or you have severe pain that cannot be managed with medicine. This information is not intended to replace advice given to you by your health care provider. Make sure you discuss any questions you have with your health care provider. Document Revised: 03/26/2020 Document Reviewed: 03/26/2020 Elsevier Patient Education  2022 [employer].

## 2021-10-22 ENCOUNTER — Ambulatory Visit (HOSPITAL_COMMUNITY)
Admission: RE | Admit: 2021-10-22 | Discharge: 2021-10-22 | Disposition: A | Discharge status: Home / Self Care | Payer: Medicaid Other | Source: Ambulatory Visit | Attending: Physician Assistant | Admitting: Physician Assistant

## 2021-10-22 DIAGNOSIS — M25562 Pain in left knee: Secondary | ICD-10-CM | POA: Insufficient documentation

## 2021-10-27 ENCOUNTER — Other Ambulatory Visit: Payer: Self-pay | Admitting: Physician Assistant

## 2021-10-27 ENCOUNTER — Telehealth: Payer: Self-pay | Admitting: *Deleted

## 2021-10-27 DIAGNOSIS — M25562 Pain in left knee: Secondary | ICD-10-CM

## 2021-10-27 NOTE — Progress Notes (Signed)
Benjamin Rad, PA-C Physician Assistant [facility] [email or web address]

## 2021-10-27 NOTE — Telephone Encounter (Signed)
-----   Message from Kennieth Rad, [place] sent at 10/27/2021  8:46 AM EST ----- Please call patient and let him know that his x-ray of his knee did not show any abnormalities.  If he is continuing to have pain, we will start a referral to orthopedics for further evaluation.

## 2021-10-27 NOTE — Telephone Encounter (Signed)
Patient verified DOB Patient is aware of no xray concerns noted and advised of seeing ortho is pain persist

## 2021-10-27 NOTE — Telephone Encounter (Signed)
Medical Assistant left message on patient's home and cell voicemail. Voicemail states to give a call back to [place] wit [facility] at [phone].

## 2021-10-29 ENCOUNTER — Emergency Department (HOSPITAL_COMMUNITY): Payer: Medicaid Other

## 2021-10-29 ENCOUNTER — Encounter (HOSPITAL_COMMUNITY): Payer: Self-pay

## 2021-10-29 ENCOUNTER — Other Ambulatory Visit: Payer: Self-pay

## 2021-10-29 ENCOUNTER — Emergency Department (HOSPITAL_COMMUNITY)
Admission: EM | Admit: 2021-10-29 | Discharge: 2021-10-29 | Disposition: A | Discharge status: Home / Self Care | Payer: Medicaid Other | Attending: Emergency Medicine | Admitting: Emergency Medicine

## 2021-10-29 DIAGNOSIS — T405X1A Poisoning by cocaine, accidental (unintentional), initial encounter: Secondary | ICD-10-CM | POA: Insufficient documentation

## 2021-10-29 DIAGNOSIS — T50901A Poisoning by unspecified drugs, medicaments and biological substances, accidental (unintentional), initial encounter: Secondary | ICD-10-CM

## 2021-10-29 DIAGNOSIS — Z7982 Long term (current) use of aspirin: Secondary | ICD-10-CM | POA: Diagnosis not present

## 2021-10-29 DIAGNOSIS — Z79899 Other long term (current) drug therapy: Secondary | ICD-10-CM | POA: Diagnosis not present

## 2021-10-29 DIAGNOSIS — Y900 Blood alcohol level of less than 20 mg/100 ml: Secondary | ICD-10-CM | POA: Insufficient documentation

## 2021-10-29 DIAGNOSIS — I2510 Atherosclerotic heart disease of native coronary artery without angina pectoris: Secondary | ICD-10-CM | POA: Insufficient documentation

## 2021-10-29 DIAGNOSIS — R112 Nausea with vomiting, unspecified: Secondary | ICD-10-CM | POA: Diagnosis not present

## 2021-10-29 DIAGNOSIS — R4020 Unspecified coma: Secondary | ICD-10-CM | POA: Diagnosis not present

## 2021-10-29 DIAGNOSIS — I10 Essential (primary) hypertension: Secondary | ICD-10-CM | POA: Diagnosis not present

## 2021-10-29 DIAGNOSIS — R231 Pallor: Secondary | ICD-10-CM | POA: Diagnosis not present

## 2021-10-29 DIAGNOSIS — R0689 Other abnormalities of breathing: Secondary | ICD-10-CM | POA: Diagnosis not present

## 2021-10-29 DIAGNOSIS — R404 Transient alteration of awareness: Secondary | ICD-10-CM | POA: Diagnosis not present

## 2021-10-29 DIAGNOSIS — T887XXA Unspecified adverse effect of drug or medicament, initial encounter: Secondary | ICD-10-CM | POA: Diagnosis not present

## 2021-10-29 LAB — CBC WITH DIFFERENTIAL/PLATELET
Abs Immature Granulocytes: 0.05 10*3/uL (ref 0.00–0.07)
Basophils Absolute: 0 10*3/uL (ref 0.0–0.1)
Basophils Relative: 1 %
Eosinophils Absolute: 0.2 10*3/uL (ref 0.0–0.5)
Eosinophils Relative: 3 %
HCT: 47.3 % (ref 39.0–52.0)
Hemoglobin: 14.9 g/dL (ref 13.0–17.0)
Immature Granulocytes: 1 %
Lymphocytes Relative: 33 %
Lymphs Abs: 2.5 10*3/uL (ref 0.7–4.0)
MCH: 25.3 pg — ABNORMAL LOW (ref 26.0–34.0)
MCHC: 31.5 g/dL (ref 30.0–36.0)
MCV: 80.3 fL (ref 80.0–100.0)
Monocytes Absolute: 0.4 10*3/uL (ref 0.1–1.0)
Monocytes Relative: 5 %
Neutro Abs: 4.3 10*3/uL (ref 1.7–7.7)
Neutrophils Relative %: 57 %
Platelets: 189 10*3/uL (ref 150–400)
RBC: 5.89 MIL/uL — ABNORMAL HIGH (ref 4.22–5.81)
RDW: 15.6 % — ABNORMAL HIGH (ref 11.5–15.5)
WBC: 7.5 10*3/uL (ref 4.0–10.5)
nRBC: 0 % (ref 0.0–0.2)

## 2021-10-29 LAB — RAPID URINE DRUG SCREEN, HOSP PERFORMED
Amphetamines: NOT DETECTED
Barbiturates: NOT DETECTED
Benzodiazepines: NOT DETECTED
Cocaine: POSITIVE — AB
Opiates: NOT DETECTED
Tetrahydrocannabinol: POSITIVE — AB

## 2021-10-29 LAB — BASIC METABOLIC PANEL
Anion gap: 6 (ref 5–15)
BUN: 10 mg/dL (ref 6–20)
CO2: 24 mmol/L (ref 22–32)
Calcium: 9.2 mg/dL (ref 8.9–10.3)
Chloride: 108 mmol/L (ref 98–111)
Creatinine, Ser: 0.83 mg/dL (ref 0.61–1.24)
GFR, Estimated: >60 mL/min (ref >60)
Glucose, Bld: 170 mg/dL — ABNORMAL HIGH (ref 70–99)
Potassium: 3.7 mmol/L (ref 3.5–5.1)
Sodium: 138 mmol/L (ref 135–145)

## 2021-10-29 LAB — ETHANOL: Alcohol, Ethyl (B): <10 mg/dL (ref <10)

## 2021-10-29 LAB — ACETAMINOPHEN LEVEL: Acetaminophen (Tylenol), Serum: <10 ug/mL — ABNORMAL LOW (ref 10–30)

## 2021-10-29 LAB — SALICYLATE LEVEL: Salicylate Lvl: <7 mg/dL — ABNORMAL LOW (ref 7.0–30.0)

## 2021-10-29 MED ORDER — ONDANSETRON 4 MG PO TBDP
4.0000 mg | ORAL_TABLET | Freq: Once | ORAL | Status: AC
  Administered 2021-10-29: 4 mg via ORAL
  Filled 2021-10-29: qty 1

## 2021-10-29 MED ORDER — SODIUM CHLORIDE 0.9 % IV BOLUS (SEPSIS)
1000.0000 mL | Freq: Once | INTRAVENOUS | Status: AC
Start: 2021-10-29 — End: 2021-10-29
  Administered 2021-10-29: 1000 mL via INTRAVENOUS

## 2021-10-29 MED ORDER — ONDANSETRON HCL 4 MG/2ML IJ SOLN
4.0000 mg | Freq: Once | INTRAMUSCULAR | Status: AC
  Administered 2021-10-29: 4 mg via INTRAVENOUS
  Filled 2021-10-29: qty 2

## 2021-10-29 MED ORDER — ONDANSETRON 8 MG PO TBDP: 8.0000 mg | ORAL_TABLET | Freq: Three times a day (TID) | ORAL | 0 refills | Status: DC | PRN

## 2021-10-29 MED ORDER — SODIUM CHLORIDE 0.9 % IV SOLN: 1000.0000 mL | INTRAVENOUS | Status: DC

## 2021-10-29 NOTE — ED Triage Notes (Signed)
Pt presents to the ED from home via [facility] with complaints of overdose. Pt was found unresponsive, respirations 6, pinpoint pupils. Pt given Narcan 2mg  IN by EMS and pt woke up after 6-7 minutes. Pt complaining of nausea, vomited x1 per EMS. Pt states I think I got Fentanyl.

## 2021-10-29 NOTE — Discharge Instructions (Addendum)
Take the medications as needed for nausea and vomiting.  Please review the discharge instructions for outpatient resources.

## 2021-10-29 NOTE — ED Provider Notes (Signed)
[facility] EMERGENCY DEPARTMENT Provider Note   CSN: [id number] Arrival date & time: 10/29/21  0231     History  Chief Complaint  Patient presents with   Drug Overdose    Benjamin Herrera is a 56 y.o. male.   Drug Overdose  Patient has a history of hypertension, gastroesophageal reflux disease, coronary artery disease, polysubstance abuse, HIV disease as well as other medical problems Patient presented to the ER for evaluation after an accidental overdose.  Patient thought she was using cocaine but wonders if he got some fentanyl.  Patient did not have any intention to harm himself.  Bystanders found him unresponsive and had to call EMS.  Patient was given 2 mg of Narcan intranasally by EMS and then he woke up after several minutes.  Patient has had some issues with nausea and vomiting since then.  He denies any fevers.  He is not having chest pain.  No abdominal pain.  Home Medications Prior to Admission medications   Medication Sig Start Date End Date Taking? Authorizing Provider  ondansetron (ZOFRAN-ODT) 8 MG disintegrating tablet Take 1 tablet (8 mg total) by mouth every 8 (eight) hours as needed for nausea or vomiting. 10/29/21  Yes Dorie Rank, MD  albuterol (VENTOLIN HFA) 108 (90 Base) MCG/ACT inhaler Inhale 2 puffs into the lungs every 6 (six) hours as needed for wheezing or shortness of breath. 04/07/21   Campbell Riches, MD  amLODipine (NORVASC) 2.5 MG tablet TAKE 1 TABLET BY MOUTH EVERY DAY 06/11/21   Hilty, Nadean Corwin, MD  aspirin 81 MG chewable tablet Chew 1 tablet (81 mg total) by mouth daily. 10/28/16   Funches, Adriana Mccallum, MD  BIKTARVY 30-120-15 MG TABS Take 1 tablet by mouth daily. 06/25/21   [provider]  darunavir (PREZISTA) 800 MG tablet Take by mouth. 11/09/16   [provider]  ezetimibe (ZETIA) 10 MG tablet Take 1 tablet (10 mg total) by mouth daily. 08/06/20 04/07/21  Pixie Casino, MD  hydrocortisone 2.5 % ointment Apply topically  2 (two) times daily. Apply around rectal area. 02/06/21   Pham, Minh Q, RPH-CPP  lidocaine (XYLOCAINE) 5 % ointment Apply 1 application topically as needed. 09/04/20   Campbell Riches, MD  meloxicam (MOBIC) 7.5 MG tablet Take 1 tablet (7.5 mg total) by mouth daily. 10/21/21   Mayers, Cari S, PA-C  metoprolol succinate (TOPROL-XL) 25 MG 24 hr tablet TAKE 1/2 TABLET BY MOUTH EVERY DAY 04/01/21   Hilty, Nadean Corwin, MD  Naphazoline-Pheniramine (OPCON-A OP) Apply 1-2 drops to eye 2 (two) times daily as needed (dry eyes.).     [provider]  nitroGLYCERIN (NITROSTAT) 0.4 MG SL tablet Place 1 tablet (0.4 mg total) under the tongue every 5 (five) minutes as needed for chest pain. 04/07/21   Campbell Riches, MD  pantoprazole (PROTONIX) 40 MG tablet Take 1 tablet (40 mg total) by mouth 2 (two) times daily before a meal. 04/07/21   Campbell Riches, MD  rosuvastatin (CRESTOR) 40 MG tablet TAKE 1 TABLET BY MOUTH EVERY DAY *PT NEEDS APPT 09/11/19   Hilty, Nadean Corwin, MD      Allergies    Citrus    Review of Systems   Review of Systems  All other systems reviewed and are negative.  Physical Exam Updated Vital Signs BP 138/76 (BP Location: Left Arm)    Pulse 65    Temp 97.7 F (36.5 C) (Oral)    Resp 16  Ht 1.854 m (6\' 1" )    Wt 102.5 kg    SpO2 100%    BMI 29.82 kg/m  Physical Exam Vitals and nursing note reviewed.  Constitutional:      General: He is not in acute distress.    Appearance: He is well-developed. He is not diaphoretic.  HENT:     Head: Normocephalic and atraumatic.     Right Ear: External ear normal.     Left Ear: External ear normal.  Eyes:     General: No scleral icterus.       Right eye: No discharge.        Left eye: No discharge.     Conjunctiva/sclera: Conjunctivae normal.  Neck:     Trachea: No tracheal deviation.  Cardiovascular:     Rate and Rhythm: Normal rate and regular rhythm.  Pulmonary:     Effort: Pulmonary effort is normal. No respiratory  distress.     Breath sounds: Normal breath sounds. No stridor. No wheezing or rales.  Abdominal:     General: Bowel sounds are normal. There is no distension.     Palpations: Abdomen is soft.     Tenderness: There is no abdominal tenderness. There is no guarding or rebound.  Musculoskeletal:        General: No tenderness or deformity.     Cervical back: Neck supple.  Skin:    General: Skin is warm and dry.     Findings: No rash.  Neurological:     General: No focal deficit present.     Cranial Nerves: No cranial nerve deficit (no facial droop, extraocular movements intact, no slurred speech).     Sensory: No sensory deficit.     Motor: No abnormal muscle tone or seizure activity.     Coordination: Coordination normal.  Psychiatric:        Mood and Affect: Mood normal.    ED Results / Procedures / Treatments   Labs (all labs ordered are listed, but only abnormal results are displayed) Labs Reviewed  BASIC METABOLIC PANEL - Abnormal; Notable for the following components:      Result Value   Glucose, Bld 170 (*)    All other components within normal limits  CBC WITH DIFFERENTIAL/PLATELET - Abnormal; Notable for the following components:   RBC 5.89 (*)    MCH 25.3 (*)    RDW 15.6 (*)    All other components within normal limits  SALICYLATE LEVEL - Abnormal; Notable for the following components:   Salicylate Lvl <0.8 (*)    All other components within normal limits  ACETAMINOPHEN LEVEL - Abnormal; Notable for the following components:   Acetaminophen (Tylenol), Serum <10 (*)    All other components within normal limits  ETHANOL  RAPID URINE DRUG SCREEN, HOSP PERFORMED    EKG None  Radiology DG Chest 2 View  Result Date: 10/29/2021 CLINICAL DATA:  Drug overdose, rule out aspiration. EXAM: CHEST - 2 VIEW COMPARISON:  05/02/2017. FINDINGS: The heart size and mediastinal contours are within normal limits. There is mild atherosclerotic calcification of the aorta. Patchy  airspace disease is present at the lung bases bilaterally. No effusion or pneumothorax. Right shoulder arthroplasty changes are noted. IMPRESSION: Patchy airspace disease at the lung bases, possible atelectasis or infiltrate. Electronically Signed   By: Brett Fairy M.D.   On: 10/29/2021 03:32    Procedures Procedures    Medications Ordered in ED Medications  sodium chloride 0.9 % bolus 1,000 mL (0 mLs  Intravenous Stopped 10/29/21 1905)    Followed by  0.9 %  sodium chloride infusion (has no administration in time range)  ondansetron (ZOFRAN-ODT) disintegrating tablet 4 mg (4 mg Oral Given 10/29/21 0240)  ondansetron (ZOFRAN) injection 4 mg (4 mg Intravenous Given 10/29/21 1745)    ED Course/ Medical Decision Making/ A&P                           Medical Decision Making  Patient presented after an accidental drug overdose.  Did not he has been in the ED now for over 17 hours.  He has remained stable.  No further vomiting.  Labs were unremarkable.  No signs of toxicity from his overdose.  Salicylate and acetaminophen level were negative.  Alcohol level negative.  CBC metabolic panel unremarkable.  At this point patient appears stable for discharge and recommend close outpatient monitoring evaluation.        Final Clinical Impression(s) / ED Diagnoses Final diagnoses:  Accidental drug overdose, initial encounter    Rx / DC Orders ED Discharge Orders          Ordered    ondansetron (ZOFRAN-ODT) 8 MG disintegrating tablet  Every 8 hours PRN        10/29/21 1928              Dorie Rank, MD 10/29/21 1939

## 2021-10-29 NOTE — ED Provider Triage Note (Signed)
Emergency Medicine Provider Triage Evaluation Note  Benjamin Herrera , a 56 y.o. male  was evaluated in triage.  Patient presenting to the ED via EMS for drug overdose.  EMS reports potential fentanyl overdose.  When they arrived he was breathing about 6 times per minute.  They provided 2 mg Narcan intranasally, 1 mg per nostril.  They report after about 5 to 6 minutes patient became more alert and oriented.  He started complaining of some nausea afterwards.  He was not provided any Zofran.  Patient has no other complaints at this time.  He denies any intentional overdose.  He states that he snorted something however is unsure what it was.  He denies any alcohol use.  Review of Systems  Positive: + nausea, abd pain  Negative: - SI, HI, AVH  Physical Exam  BP (!) 148/91 (BP Location: Left Arm)    Pulse 70    Temp (!) 97.5 F (36.4 C) (Oral)    Resp 15    SpO2 95%  Gen:   Awake, no distress   Resp:  Normal effort  MSK:   Moves extremities without difficulty  Other:    Medical Decision Making  Medically screening exam initiated at 2:35 AM.  Appropriate orders placed.  [name] was informed that the remainder of the evaluation will be completed by another provider, this initial triage assessment does not replace that evaluation, and the importance of remaining in the ED until their evaluation is complete.     Eustaquio Maize, PA-C 10/29/21 [phone]

## 2021-10-30 ENCOUNTER — Telehealth: Payer: Self-pay

## 2021-10-30 NOTE — Telephone Encounter (Signed)
Transition Care Management Unsuccessful Follow-up Telephone Call  Date of discharge and from where:  10/29/2021-[place]   Attempts:  1st Attempt  Reason for unsuccessful TCM follow-up call:  Left voice message

## 2021-11-02 NOTE — Telephone Encounter (Signed)
Transition Care Management Unsuccessful Follow-up Telephone Call  Date of discharge and from where:  10/29/2021-[place]  Attempts:  2nd Attempt  Reason for unsuccessful TCM follow-up call:  Left voice message

## 2021-11-03 ENCOUNTER — Other Ambulatory Visit: Payer: Self-pay | Admitting: Physician Assistant

## 2021-11-03 DIAGNOSIS — M25562 Pain in left knee: Secondary | ICD-10-CM

## 2021-11-03 NOTE — Telephone Encounter (Signed)
Transition Care Management Unsuccessful Follow-up Telephone Call  Date of discharge and from where:  10/29/2021-[place]   Attempts:  3rd Attempt  Reason for unsuccessful TCM follow-up call:  Left voice message

## 2021-11-05 ENCOUNTER — Ambulatory Visit (HOSPITAL_BASED_OUTPATIENT_CLINIC_OR_DEPARTMENT_OTHER): Payer: Medicaid Other | Admitting: Orthopaedic Surgery

## 2021-11-08 NOTE — Progress Notes (Addendum)
Office Visit    Patient Name: Benjamin Herrera Date of Encounter: 11/09/2021  PCP:  Charlott Rakes, MD   [place]  Cardiologist:  Pixie Casino, MD  Advanced Practice Provider:  No care team member to display Electrophysiologist:  None   Chief Complaint    Benjamin Herrera is a 56 y.o. male with a hx of upper mid epigastric and central chest pain, history of CAD with stent to OM1, polysubstance abuse, hypertension, hyperlipidemia, and GERD presents today for annual follow-up appointment.  Past Medical History    Past Medical History:  Diagnosis Date   Achalasia and cardiospasm    Allergy    Arthritis    back ,shoulders    Asthma    Boils    under arms hx of   CAD (coronary artery disease)    a. 09/2016: cath showing 100% RCA occlusion with collaterals and 90% OM1 (treated with DES)   Candidiasis of mouth    Candidiasis of unspecified site    Cholelithiasis    Cocaine abuse ([place])    Quit 09/2016   Condyloma acuminatum in male of scrotum & anal canal s/p laser ablation 07/03/2012   Coronary artery disease    Dysphagia    Fracture, humerus    GERD (gastroesophageal reflux disease)    Heart attack ([place])    Hemorrhoids    Hepatitis 1993   A   Herpes labialis    HIV (human immunodeficiency virus infection) ([place]) [place]   Hypertension Dx 2014   off all bp meds for last 9 or 10 months   Pilonidal disease 01/10/2012   Polysubstance abuse ([place])    Psoriasis    hx of   Sleep apnea    does  have CPAP   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/03/2012   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 08/03/2012   Past Surgical History:  Procedure Laterality Date   BALLOON DILATION N/A 11/11/2015   Procedure: BALLOON DILATION;  Surgeon: Mauri Pole, MD;  Location: [facility];  Service: Endoscopy;  Laterality: N/A;   BOTOX INJECTION  10/11/2012   Procedure: BOTOX INJECTION;  Surgeon:  Inda Castle, MD;  Location: [facility];  Service: Endoscopy;  Laterality: N/A;   BOTOX INJECTION N/A 01/25/2013   Procedure: BOTOX INJECTION;  Surgeon: Inda Castle, MD;  Location: [facility];  Service: Endoscopy;  Laterality: N/A;   BOTOX INJECTION N/A 06/21/2013   Procedure: BOTOX INJECTION;  Surgeon: Inda Castle, MD;  Location: [facility];  Service: Endoscopy;  Laterality: N/A;   BOTOX INJECTION N/A 10/08/2013   Procedure: BOTOX INJECTION;  Surgeon: Inda Castle, MD;  Location: [facility];  Service: Endoscopy;  Laterality: N/A;   BOTOX INJECTION N/A 06/16/2015   Procedure: BOTOX INJECTION;  Surgeon: Inda Castle, MD;  Location: [facility];  Service: Endoscopy;  Laterality: N/A;   CARDIAC CATHETERIZATION N/A 10/11/2016   Procedure: Left Heart Cath and Coronary Angiography;  Surgeon: Burnell Blanks, MD;  mRCA 100% w/ L>R collaterals, OM1 90%   CARDIAC CATHETERIZATION N/A 10/11/2016   Procedure: Coronary Stent Intervention;  Surgeon: Burnell Blanks, MD;  RESOLUTE ONYX 3.5X15 DES OM1   COLONOSCOPY  05/19/2012   Procedure: COLONOSCOPY;  Surgeon: Inda Castle, MD;  [facility]: [facility];  Service: Endoscopy;  Laterality: N/A;  jill trying to contact pt to come in 0830 for 930 case, phone not  accepting messages   COLONOSCOPY     ESOPHAGEAL MANOMETRY  05/29/2012   Procedure: ESOPHAGEAL MANOMETRY (EM);  Surgeon: Inda Castle, MD;  [facility]: [facility];  Service: Endoscopy;  Laterality: N/A;   ESOPHAGEAL MANOMETRY N/A 10/06/2015   Procedure: ESOPHAGEAL MANOMETRY (EM);  Surgeon: Mauri Pole, MD;  [facility]: [facility];  Service: Endoscopy;  Laterality: N/A;   ESOPHAGOGASTRODUODENOSCOPY  11/11/2011   Procedure: ESOPHAGOGASTRODUODENOSCOPY (EGD);  Surgeon: Inda Castle, MD;  [facility];  Service: Endoscopy;  Laterality: N/A;  botox injection  called Pt to change time of procedure per Dr Deatra Ina   ESOPHAGOGASTRODUODENOSCOPY  03/10/2012    Procedure: ESOPHAGOGASTRODUODENOSCOPY (EGD);  Surgeon: Inda Castle, MD;  Location: [facility];  Service: Endoscopy;  Laterality: N/A;   ESOPHAGOGASTRODUODENOSCOPY  10/11/2012   Procedure: ESOPHAGOGASTRODUODENOSCOPY (EGD);  Surgeon: Inda Castle, MD;  Location: [facility];  Service: Endoscopy;  Laterality: N/A;   ESOPHAGOGASTRODUODENOSCOPY N/A 01/25/2013   Procedure: ESOPHAGOGASTRODUODENOSCOPY (EGD);  Surgeon: Inda Castle, MD;  Location: [facility];  Service: Endoscopy;  Laterality: N/A;   ESOPHAGOGASTRODUODENOSCOPY N/A 06/21/2013   Procedure: ESOPHAGOGASTRODUODENOSCOPY (EGD);  Surgeon: Inda Castle, MD;  Location: [facility];  Service: Endoscopy;  Laterality: N/A;   ESOPHAGOGASTRODUODENOSCOPY (EGD) WITH PROPOFOL N/A 10/08/2013   Procedure: ESOPHAGOGASTRODUODENOSCOPY (EGD) WITH PROPOFOL;  Surgeon: Inda Castle, MD;  [facility];  Service: Endoscopy;  Laterality: N/A;   ESOPHAGOGASTRODUODENOSCOPY (EGD) WITH PROPOFOL N/A 06/16/2015   Procedure: ESOPHAGOGASTRODUODENOSCOPY (EGD) WITH PROPOFOL;  Surgeon: Inda Castle, MD;  [facility];  Service: Endoscopy;  Laterality: N/A;   ESOPHAGOGASTRODUODENOSCOPY (EGD) WITH PROPOFOL N/A 11/11/2015   Procedure: ESOPHAGOGASTRODUODENOSCOPY (EGD) WITH PROPOFOL;  Surgeon: Mauri Pole, MD;  [facility];  Service: Endoscopy;  Laterality: N/A;   EXAMINATION UNDER ANESTHESIA  07/24/2012   Procedure: EXAM UNDER ANESTHESIA;  Surgeon: Adin Hector, MD;  Location: [facility];  Service: General;  Laterality: N/A;   humeral fracture surgery Right yrs ago   PILONIDAL CYST EXCISION  07/24/2012   Procedure: CYST EXCISION PILONIDAL SIMPLE;  Surgeon: Adin Hector, MD;  Location: [facility];  Service: General;  Laterality: N/A;  Exam Under Anesthesia,, Excision Pilonidal Disease,    right shoulder replacement  2008   x 2   UPPER GASTROINTESTINAL ENDOSCOPY     WART FULGURATION  07/24/2012   Procedure: FULGURATION ANAL WART;   Surgeon: Adin Hector, MD;  Location: [facility];  Service: General;  Laterality: N/A;  excision of raphe mass    Allergies  Allergies  Allergen Reactions   Citrus Swelling    Mouth itching and swelling     History of Present Illness    Benjamin Herrera is a 56 y.o. male with a hx of upper mid epigastric and central chest pain, history of CAD with stent to OM1, polysubstance abuse, hypertension, hyperlipidemia, and GERD presents today for annual follow-up appointment last seen 08/06/2020 by Dr. Debara Pickett.  He was originally seen upper mid epigastric chest pain.  Significant fiber.  Cocaine use and achalasia as well.  Colic in the past.  EKG shows new T wave inversions.  Is a family history of CAD in his mother.  Based on his history and symptoms a stress test was performed which showed LVEF of 47% with some inferior changes which were concerning for ischemia.  He cardiac catheterization 09/2016 which demonstrated severe two-vessel CAD mid to distal RCA occlusion and left-to-right collaterals, as well as a 90% OM1 lesion which was successfully stented  with a DES.  He was placed on aspirin, Plavix, and statin.  Follow-up and did not have any recurrent chest pain.  He was hypertensive at this time and not on a beta-blocker.  07/2020 he continued to feel well without any chest pain or shortness of breath.  Blood pressure was well controlled at this time.  He was in the ED a few days ago for drug overdose.  The patient thought he was using cocaine but wonders if it was laced with fentanyl.  He did not have any intention to harm himself.  Bystanders found him unresponsive and had to call EMS.  He was given 2 mg of Narcan and he woke up after several minutes.  Patient has some issues with nausea and vomiting after that.  No chest pain.  Today, he is having some left sided chest pain that occurs intermittently at night. He states his pain is relieved by metoprolol but is causing his a lot of anxiety with his  history of stenting. He is wanting further work-up done to make sure his pain is not his heart. He recently had CPR done due to accidental overdose. He states his chest is still a little sore from his but no bruising present today on exam and no tenderness to palpation. He does not endorse any jaw pain (this occurred before his stent), left arm pain, SOB, palpitations, or lower extremity edema. He states he is done using cocaine.   Reports no shortness of breath nor dyspnea on exertion. No edema, orthopnea, PND. Reports no palpitations.     EKGs/Labs/Other Studies Reviewed:   The following studies were reviewed today:  Cardiac cath 09/2016  Prox RCA lesion, 20 %stenosed. Prox RCA to Mid RCA lesion, 99 %stenosed. Mid RCA to Dist RCA lesion, 100 %stenosed. 1st Mrg-2 lesion, 60 %stenosed. 1st Mrg-1 lesion, 90 %stenosed. Post intervention, there is a 0% residual stenosis. A STENT RESOLUTE ONYX 3.5X15 drug eluting stent was successfully placed. Ost LAD to Mid LAD lesion, 20 %stenosed.   1. Severe double vessel CAD 2. Unstable angina 3. Severe stenosis first obtuse marginal branch 4. Successful PTCA/DES x 1 OM1 5. Chronic total occlusion mid RCA. The distal vessel fills from left to right collaterals.    Recommendations: Will plan one year of DAPT with ASA and Plavix. Would start a statin. Likely discharge tomorrow. Follow up with Dr. Debara Pickett.  EKG:  EKG is  ordered today.  The ekg ordered today demonstrates normal sinus rhythm, rate 65 bpm.  Recent Labs: 10/29/2021: BUN 10; Creatinine, Ser 0.83; Hemoglobin 14.9; Platelets 189; Potassium 3.7; Sodium 138  Recent Lipid Panel    Component Value Date/Time   CHOL 193 07/30/2020 1437   CHOL 127 10/24/2017 1118   TRIG 249 (H) 07/30/2020 1437   HDL 31 (L) 07/30/2020 1437   HDL 35 (L) 10/24/2017 1118   CHOLHDL 6.2 (H) 07/30/2020 1437   VLDL 21 01/26/2017 1526   LDLCALC 124 (H) 07/30/2020 1437    Home Medications   Current Meds   Medication Sig   albuterol (VENTOLIN HFA) 108 (90 Base) MCG/ACT inhaler Inhale 2 puffs into the lungs every 6 (six) hours as needed for wheezing or shortness of breath.   amLODipine (NORVASC) 2.5 MG tablet TAKE 1 TABLET BY MOUTH EVERY DAY   aspirin 81 MG chewable tablet Chew 1 tablet (81 mg total) by mouth daily.   BIKTARVY 30-120-15 MG TABS Take 1 tablet by mouth daily.   hydrocortisone 2.5 % ointment Apply topically  2 (two) times daily. Apply around rectal area.   lidocaine (XYLOCAINE) 5 % ointment Apply 1 application topically as needed.   meloxicam (MOBIC) 7.5 MG tablet Take 1 tablet (7.5 mg total) by mouth daily.   metoprolol succinate (TOPROL-XL) 25 MG 24 hr tablet TAKE 1/2 TABLET BY MOUTH EVERY DAY   Naphazoline-Pheniramine (OPCON-A OP) Apply 1-2 drops to eye 2 (two) times daily as needed (dry eyes.).    nitroGLYCERIN (NITROSTAT) 0.4 MG SL tablet Place 1 tablet (0.4 mg total) under the tongue every 5 (five) minutes as needed for chest pain.   ondansetron (ZOFRAN-ODT) 8 MG disintegrating tablet Take 1 tablet (8 mg total) by mouth every 8 (eight) hours as needed for nausea or vomiting.   pantoprazole (PROTONIX) 40 MG tablet Take 1 tablet (40 mg total) by mouth 2 (two) times daily before a meal.   rosuvastatin (CRESTOR) 40 MG tablet TAKE 1 TABLET BY MOUTH EVERY DAY *PT NEEDS APPT     Review of Systems      All other systems reviewed and are otherwise negative except as noted above.  Physical Exam    VS:  BP (!) 144/78    Pulse 65    Ht 6' 1.5" (1.867 m)    Wt 236 lb 9.6 oz (107.3 kg)    BMI 30.79 kg/m  , BMI Body mass index is 30.79 kg/m.  Wt Readings from Last 3 Encounters:  11/09/21 236 lb 9.6 oz (107.3 kg)  10/29/21 226 lb (102.5 kg)  10/21/21 226 lb (102.5 kg)     GEN: Well nourished, well developed, in no acute distress. HEENT: normal. Neck: Supple, no JVD, carotid bruits, or masses. Cardiac: RRR, no murmurs, rubs, or gallops. No clubbing, cyanosis, edema.  Radials/PT 2+  and equal bilaterally.  Respiratory:  Respirations regular and unlabored, clear to auscultation bilaterally. GI: Soft, nontender, nondistended. MS: No deformity or atrophy. Skin: Warm and dry, no rash. Neuro:  Strength and sensation are intact. Psych: Normal affect.  Assessment & Plan    CAD s/p DES to OM1 (09/2016) -He has had chest pain on the left side which is relieved by metoprolol -He notices the chest pain more at night and it happens randomly -Continue GDMT: Toprol-XL, Norvasc, ASA81mg ,  Crestor, and PRN nitro sublingual  -Will order Myoview Lexiscan for further work-up of chest pain with prior stenting  Hypertension -Continue current medication regimen, 144/78 today in the clinic -Continue to take your BP at home a few times a week and bring these values to his next appointment   Hyperlipidemia -Continue Crestor, not taking Zetia. Plan to resume if LDL not at goal. -Recent lipid panel 10/21 showed LDL 124, HDL 31, total cholesterol 193, and Triglycerides 249 -Goal LDL < 70 -Will need an updated lipid panel which we ordered today  HIV- on protease inhibitors  Polysubstance abuse with recent accidental overdose of cocaine -cessation advised  OSA on CPAP -remains compliant    Disposition: Follow up one month with Pixie Casino, MD or APP.  Signed, Elgie Collard, PA-C 11/09/2021, 10:51 AM [place]

## 2021-11-09 ENCOUNTER — Encounter (HOSPITAL_BASED_OUTPATIENT_CLINIC_OR_DEPARTMENT_OTHER): Payer: Self-pay | Admitting: Physician Assistant

## 2021-11-09 ENCOUNTER — Other Ambulatory Visit: Payer: Self-pay

## 2021-11-09 ENCOUNTER — Ambulatory Visit (HOSPITAL_BASED_OUTPATIENT_CLINIC_OR_DEPARTMENT_OTHER): Payer: Medicaid Other | Admitting: Physician Assistant

## 2021-11-09 VITALS — BP 144/78 | HR 65 | Ht 73.5 in | Wt 236.6 lb

## 2021-11-09 DIAGNOSIS — E785 Hyperlipidemia, unspecified: Secondary | ICD-10-CM

## 2021-11-09 DIAGNOSIS — I2510 Atherosclerotic heart disease of native coronary artery without angina pectoris: Secondary | ICD-10-CM | POA: Diagnosis not present

## 2021-11-09 DIAGNOSIS — I10 Essential (primary) hypertension: Secondary | ICD-10-CM

## 2021-11-09 DIAGNOSIS — B20 Human immunodeficiency virus [HIV] disease: Secondary | ICD-10-CM | POA: Diagnosis not present

## 2021-11-09 DIAGNOSIS — F1910 Other psychoactive substance abuse, uncomplicated: Secondary | ICD-10-CM

## 2021-11-09 DIAGNOSIS — Z9989 Dependence on other enabling machines and devices: Secondary | ICD-10-CM

## 2021-11-09 DIAGNOSIS — G4733 Obstructive sleep apnea (adult) (pediatric): Secondary | ICD-10-CM

## 2021-11-09 NOTE — Addendum Note (Signed)
Addended by: Gerald Stabs on: 11/09/2021 01:01 PM   Modules accepted: Orders

## 2021-11-09 NOTE — Patient Instructions (Signed)
Medication Instructions:  Your Physician recommend you continue on your current medication as directed.    *If you need a refill on your cardiac medications before your next appointment, please call your pharmacy*   Lab Work: Your physician recommends that you return for lab work Tomorrow- Fasting Lipid Panel and Liver Function Tests   If you have labs (blood work) drawn today and your tests are completely normal, you will receive your results only by: MyChart Message (if you have MyChart) OR A paper copy in the mail If you have any lab test that is abnormal or we need to change your treatment, we will call you to review the results.   Testing/Procedures: Lexiscan Stress Test  .  Please arrive 15 minutes prior to your appointment time for registration and insurance purposes.  The test will take approximately 3 to 4 hours to complete; you may bring reading material.  If someone comes with you to your appointment, they will need to remain in the main lobby due to limited space in the testing area. **If you are pregnant or breastfeeding, please notify the nuclear lab prior to your appointment**  How to prepare for your Myocardial Perfusion Test: Do not eat or drink 3 hours prior to your test, except you may have water. Do not consume products containing caffeine (regular or decaffeinated) 12 hours prior to your test. (ex: coffee, chocolate, sodas, tea). Do bring a list of your current medications with you.  If not listed below, you may take your medications as normal. Do wear comfortable clothes (no dresses or overalls) and walking shoes, tennis shoes preferred (No heels or open toe shoes are allowed). Do NOT wear cologne, perfume, aftershave, or lotions (deodorant is allowed). If these instructions are not followed, your test will have to be rescheduled.  Please report to [address] for your test.  If you have questions or concerns about your appointment, you can call  the Nuclear Lab at [phone].  If you cannot keep your appointment, please provide 24 hours notification to the Nuclear Lab, to avoid a possible $50 charge to your account.    Follow-Up: At [facility], you and your health needs are our priority.  As part of our continuing mission to provide you with exceptional heart care, we have created designated Provider Care Teams.  These Care Teams include your primary Cardiologist (physician) and Advanced Practice Providers (APPs -  Physician Assistants and Nurse Practitioners) who all work together to provide you with the care you need, when you need it.  We recommend signing up for the patient portal called "MyChart".  Sign up information is provided on this After Visit Summary.  MyChart is used to connect with patients for Virtual Visits (Telemedicine).  Patients are able to view lab/test results, encounter notes, upcoming appointments, etc.  Non-urgent messages can be sent to your provider as well.   To learn more about what you can do with MyChart, go to [email or web address].    Your next appointment:   1 month(s)  The format for your next appointment:   In Person  Provider:   Pixie Casino, MD  or APP    Other Instructions Please check your blood pressure every other day and keep a log. If your blood pressure is consistently greater than 130/80 please send [place] a mychart message or call our office at [phone]

## 2021-11-10 DIAGNOSIS — I2510 Atherosclerotic heart disease of native coronary artery without angina pectoris: Secondary | ICD-10-CM | POA: Diagnosis not present

## 2021-11-10 DIAGNOSIS — E785 Hyperlipidemia, unspecified: Secondary | ICD-10-CM | POA: Diagnosis not present

## 2021-11-10 LAB — HEPATIC FUNCTION PANEL
ALT: 13 IU/L (ref 0–44)
AST: 13 IU/L (ref 0–40)
Albumin: 4.2 g/dL (ref 3.8–4.9)
Alkaline Phosphatase: 76 IU/L (ref 44–121)
Bilirubin Total: 0.3 mg/dL (ref 0.0–1.2)
Bilirubin, Direct: 0.11 mg/dL (ref 0.00–0.40)
Total Protein: 7 g/dL (ref 6.0–8.5)

## 2021-11-10 LAB — LIPID PANEL
Chol/HDL Ratio: 4.4 ratio (ref 0.0–5.0)
Cholesterol, Total: 158 mg/dL (ref 100–199)
HDL: 36 mg/dL — ABNORMAL LOW (ref >39)
LDL Chol Calc (NIH): 107 mg/dL — ABNORMAL HIGH (ref 0–99)
Triglycerides: 77 mg/dL (ref 0–149)
VLDL Cholesterol Cal: 15 mg/dL (ref 5–40)

## 2021-11-11 ENCOUNTER — Other Ambulatory Visit: Payer: Self-pay

## 2021-11-11 ENCOUNTER — Ambulatory Visit (HOSPITAL_BASED_OUTPATIENT_CLINIC_OR_DEPARTMENT_OTHER): Payer: Medicaid Other | Admitting: Orthopaedic Surgery

## 2021-11-11 ENCOUNTER — Ambulatory Visit (HOSPITAL_BASED_OUTPATIENT_CLINIC_OR_DEPARTMENT_OTHER)
Admission: RE | Admit: 2021-11-11 | Discharge: 2021-11-11 | Disposition: A | Discharge status: Home / Self Care | Payer: Medicaid Other | Source: Ambulatory Visit | Attending: Orthopaedic Surgery | Admitting: Orthopaedic Surgery

## 2021-11-11 DIAGNOSIS — G8929 Other chronic pain: Secondary | ICD-10-CM

## 2021-11-11 DIAGNOSIS — Z96611 Presence of right artificial shoulder joint: Secondary | ICD-10-CM | POA: Diagnosis not present

## 2021-11-11 DIAGNOSIS — M25511 Pain in right shoulder: Secondary | ICD-10-CM

## 2021-11-11 DIAGNOSIS — M25562 Pain in left knee: Secondary | ICD-10-CM

## 2021-11-11 NOTE — Progress Notes (Signed)
Chief Complaint: left knee pain and left shoulder pain     History of Present Illness:    Benjamin Herrera is a 56 y.o. male presents with left knee pain now going on for 1 month.  This bothers him most when he is getting up and out of a chair.  He states the pain is centrally located.  Feels like the knee is giving out.  He is taking Mobic which does not help.  He also states that with regard to the right shoulder he previously was treated for fracture in 2008.  He subsequently underwent total shoulder arthroplasty.  This was done in 2008 at [facility].  Since that time he has had limited function of the shoulder.  He does have chronic pain from this.  He states that his shoulder function is quite minimal at this time.  He is right-hand dominant.  He has a history of HIV with an undetectable viral load    Surgical History:   None  PMH/PSH/Family History/Social History/Meds/Allergies:    Past Medical History:  Diagnosis Date   Achalasia and cardiospasm    Allergy    Arthritis    back ,shoulders    Asthma    Boils    under arms hx of   CAD (coronary artery disease)    a. 09/2016: cath showing 100% RCA occlusion with collaterals and 90% OM1 (treated with DES)   Candidiasis of mouth    Candidiasis of unspecified site    Cholelithiasis    Cocaine abuse ([place])    Quit 09/2016   Condyloma acuminatum in male of scrotum & anal canal s/p laser ablation 07/03/2012   Coronary artery disease    Dysphagia    Fracture, humerus    GERD (gastroesophageal reflux disease)    Heart attack ([place])    Hemorrhoids    Hepatitis 1993   A   Herpes labialis    HIV (human immunodeficiency virus infection) ([place]) [place]   Hypertension Dx 2014   off all bp meds for last 9 or 10 months   Pilonidal disease 01/10/2012   Polysubstance abuse ([place])    Psoriasis    hx of   Sleep apnea    does  have CPAP   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 08/03/2012   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 08/03/2012   Past Surgical History:  Procedure Laterality Date   BALLOON DILATION N/A 11/11/2015   Procedure: BALLOON DILATION;  Surgeon: Mauri Pole, MD;  Location: [facility];  Service: Endoscopy;  Laterality: N/A;   BOTOX INJECTION  10/11/2012   Procedure: BOTOX INJECTION;  Surgeon: Inda Castle, MD;  Location: [facility];  Service: Endoscopy;  Laterality: N/A;   BOTOX INJECTION N/A 01/25/2013   Procedure: BOTOX INJECTION;  Surgeon: Inda Castle, MD;  [facility];  Service: Endoscopy;  Laterality: N/A;   BOTOX INJECTION N/A 06/21/2013   Procedure: BOTOX INJECTION;  Surgeon: Inda Castle, MD;  [facility];  Service: Endoscopy;  Laterality: N/A;   BOTOX INJECTION N/A 10/08/2013   Procedure: BOTOX INJECTION;  Surgeon: Inda Castle, MD;  [facility];  Service: Endoscopy;  Laterality: N/A;   BOTOX INJECTION N/A 06/16/2015   Procedure: BOTOX  INJECTION;  Surgeon: Inda Castle, MD;  [facility];  Service: Endoscopy;  Laterality: N/A;   CARDIAC CATHETERIZATION N/A 10/11/2016   Procedure: Left Heart Cath and Coronary Angiography;  Surgeon: Burnell Blanks, MD;  mRCA 100% w/ L>R collaterals, OM1 90%   CARDIAC CATHETERIZATION N/A 10/11/2016   Procedure: Coronary Stent Intervention;  Surgeon: Burnell Blanks, MD;  RESOLUTE ONYX 3.5X15 DES OM1   COLONOSCOPY  05/19/2012   Procedure: COLONOSCOPY;  Surgeon: Inda Castle, MD;  [facility];  Service: Endoscopy;  Laterality: N/A;  jill trying to contact pt to come in 0830 for 930 case, phone not accepting messages   COLONOSCOPY     ESOPHAGEAL MANOMETRY  05/29/2012   Procedure: ESOPHAGEAL MANOMETRY (EM);  Surgeon: Inda Castle, MD;  [facility];  Service: Endoscopy;  Laterality: N/A;   ESOPHAGEAL MANOMETRY N/A 10/06/2015   Procedure: ESOPHAGEAL MANOMETRY (EM);  Surgeon:  Mauri Pole, MD;  [facility];  Service: Endoscopy;  Laterality: N/A;   ESOPHAGOGASTRODUODENOSCOPY  11/11/2011   Procedure: ESOPHAGOGASTRODUODENOSCOPY (EGD);  Surgeon: Inda Castle, MD;  [facility];  Service: Endoscopy;  Laterality: N/A;  botox injection  called Pt to change time of procedure per Dr Deatra Ina   ESOPHAGOGASTRODUODENOSCOPY  03/10/2012   Procedure: ESOPHAGOGASTRODUODENOSCOPY (EGD);  Surgeon: Inda Castle, MD;  [facility];  Service: Endoscopy;  Laterality: N/A;   ESOPHAGOGASTRODUODENOSCOPY  10/11/2012   Procedure: ESOPHAGOGASTRODUODENOSCOPY (EGD);  Surgeon: Inda Castle, MD;  [facility];  Service: Endoscopy;  Laterality: N/A;   ESOPHAGOGASTRODUODENOSCOPY N/A 01/25/2013   Procedure: ESOPHAGOGASTRODUODENOSCOPY (EGD);  Surgeon: Inda Castle, MD;  Location: [facility];  Service: Endoscopy;  Laterality: N/A;   ESOPHAGOGASTRODUODENOSCOPY N/A 06/21/2013   Procedure: ESOPHAGOGASTRODUODENOSCOPY (EGD);  Surgeon: Inda Castle, MD;  Location[facility];  Service: Endoscopy;  Laterality: N/A;   ESOPHAGOGASTRODUODENOSCOPY (EGD) WITH PROPOFOL N/A 10/08/2013   Procedure: ESOPHAGOGASTRODUODENOSCOPY (EGD) WITH PROPOFOL;  Surgeon: Inda Castle, MD;  [facility];  Service: Endoscopy;  Laterality: N/A;   ESOPHAGOGASTRODUODENOSCOPY (EGD) WITH PROPOFOL N/A 06/16/2015   Procedure: ESOPHAGOGASTRODUODENOSCOPY (EGD) WITH PROPOFOL;  Surgeon: Inda Castle, MD;  [facility];  Service: Endoscopy;  Laterality: N/A;   ESOPHAGOGASTRODUODENOSCOPY (EGD) WITH PROPOFOL N/A 11/11/2015   Procedure: ESOPHAGOGASTRODUODENOSCOPY (EGD) WITH PROPOFOL;  Surgeon: Mauri Pole, MD;  [facility];  Service: Endoscopy;  Laterality: N/A;   EXAMINATION UNDER ANESTHESIA  07/24/2012   Procedure: EXAM UNDER ANESTHESIA;  Surgeon: Adin Hector, MD;  [facility]: [facility];  Service: General;  Laterality: N/A;   humeral fracture surgery  Right yrs ago   PILONIDAL CYST EXCISION  07/24/2012   Procedure: CYST EXCISION PILONIDAL SIMPLE;  Surgeon: Adin Hector, MD;  Location: [facility];  Service: General;  Laterality: N/A;  Exam Under Anesthesia,, Excision Pilonidal Disease,    right shoulder replacement  2008   x 2   UPPER GASTROINTESTINAL ENDOSCOPY     WART FULGURATION  07/24/2012   Procedure: FULGURATION ANAL WART;  Surgeon: Adin Hector, MD;  Location: [facility];  Service: General;  Laterality: N/A;  excision of raphe mass   Social History   Socioeconomic History   Marital status: Single    Spouse name: Not on file   Number of children: Not on file   Years of education: Not on file   Highest education level: Not on file  Occupational History   Occupation: Disabled  Tobacco Use   Smoking status: Every Day  Packs/day: 0.25    Years: 25.00    Pack years: 6.25    Types: Cigarettes   Smokeless tobacco: Never  Substance and Sexual Activity   Alcohol use: No    Alcohol/week: 0.0 standard drinks   Drug use: Yes    Frequency: 3.0 times per week    Types: Marijuana, Cocaine   Sexual activity: Not Currently    Partners: Male    Comment: pt. given condoms, used marijuana -3 days ago 05/14/16  Other Topics Concern   Not on file  Social History Narrative   Pt lives in a ground floor apartment in [place].   Social Determinants of Health   Financial Resource Strain: Not on file  Food Insecurity: Not on file  Transportation Needs: Not on file  Physical Activity: Not on file  Stress: Not on file  Social Connections: Not on file   Family History  Problem Relation Age of Onset   Arthritis Mother    Hypertension Mother    Heart disease Mother        MI age 65   Diabetes Maternal Grandmother    Stomach cancer Paternal Grandmother    Colon cancer Neg Hx    Colon polyps Neg Hx    Esophageal cancer Neg Hx    Rectal cancer Neg Hx    Allergies  Allergen Reactions   Citrus Swelling    Mouth itching and swelling     Current Outpatient Medications  Medication Sig Dispense Refill   albuterol (VENTOLIN HFA) 108 (90 Base) MCG/ACT inhaler Inhale 2 puffs into the lungs every 6 (six) hours as needed for wheezing or shortness of breath. 1 each 2   amLODipine (NORVASC) 2.5 MG tablet TAKE 1 TABLET BY MOUTH EVERY DAY 90 tablet 1   aspirin 81 MG chewable tablet Chew 1 tablet (81 mg total) by mouth daily. 90 tablet 3   BIKTARVY 30-120-15 MG TABS Take 1 tablet by mouth daily.     ezetimibe (ZETIA) 10 MG tablet Take 1 tablet (10 mg total) by mouth daily. 90 tablet 3   hydrocortisone 2.5 % ointment Apply topically 2 (two) times daily. Apply around rectal area. 30 g 2   lidocaine (XYLOCAINE) 5 % ointment Apply 1 application topically as needed. 35.44 g 0   meloxicam (MOBIC) 7.5 MG tablet Take 1 tablet (7.5 mg total) by mouth daily. 30 tablet 0   metoprolol succinate (TOPROL-XL) 25 MG 24 hr tablet TAKE 1/2 TABLET BY MOUTH EVERY DAY 45 tablet 2   Naphazoline-Pheniramine (OPCON-A OP) Apply 1-2 drops to eye 2 (two) times daily as needed (dry eyes.).      nitroGLYCERIN (NITROSTAT) 0.4 MG SL tablet Place 1 tablet (0.4 mg total) under the tongue every 5 (five) minutes as needed for chest pain. 3 tablet 3   ondansetron (ZOFRAN-ODT) 8 MG disintegrating tablet Take 1 tablet (8 mg total) by mouth every 8 (eight) hours as needed for nausea or vomiting. 12 tablet 0   pantoprazole (PROTONIX) 40 MG tablet Take 1 tablet (40 mg total) by mouth 2 (two) times daily before a meal. 60 tablet 6   rosuvastatin (CRESTOR) 40 MG tablet TAKE 1 TABLET BY MOUTH EVERY DAY *PT NEEDS APPT 30 tablet 3   No current facility-administered medications for this visit.   No results found.  Review of Systems:   A ROS was performed including pertinent positives and negatives as documented in the HPI.  Physical Exam :   Constitutional: NAD and appears stated age Neurological:  Alert and oriented Psych: Appropriate affect and cooperative There were no  vitals taken for this visit.   Comprehensive Musculoskeletal Exam:    Right incision is well-healed.  There is no erythema or drainage.  Active forward elevation on the right is to 90 degrees external rotation at the side is to 45 degrees internal rotation is to sacrum.  Of the left knee he has tenderness to palpation centrally and deep in the knee.  Range of motion about the left knee is from 0-135 without difficulty.  He has no crepitation.  He is got no medial or lateral joint line tenderness.  Negative Lachman.  No varus or valgus laxity  Imaging:   Xray (4 views left knee and 3 views right shoulder): Normal left knee, status post right shoulder arthroplasty with loosening of the humeral stem  I personally reviewed and interpreted the radiographs.   Assessment:   56 year old male with left patellofemoral type pain in the knee.  I have advised him to specifically outside of hip and quad exercises to help with this.  I have also advised him on an over-the-counter patella sleeve which she will obtain.  I have also offered him an injection which she would like to defer at today's visit.  With regard to the right shoulder while he does have a lucent humeral stem I have advised that this type of revision would ultimately be a very large procedure.  He does not have any overt signs of infection at this time.  Would ultimately given the fact that he has chronic pain and not necessarily believe that a revision would dramatically improve his pain.  It is possible that conversion to reverse arthroplasty would improve his range of motion although this would be a very large procedure that can potentially give him more harm.  As result of advised that he defer revision surgery until range of motion is more limited at this time.  Plan :    -Return to clinic as needed     I personally saw and evaluated the patient, and participated in the management and treatment plan.  Vanetta Mulders, MD Attending  Physician, Orthopedic Surgery  This document was dictated using Dragon voice recognition software. A reasonable attempt at proof reading has been made to minimize errors.

## 2021-11-17 ENCOUNTER — Telehealth (HOSPITAL_BASED_OUTPATIENT_CLINIC_OR_DEPARTMENT_OTHER): Payer: Self-pay

## 2021-11-17 NOTE — Telephone Encounter (Addendum)
Called results to patient. Patient endorses understanding.    ----- Message from Elgie Collard, PA-C sent at 11/12/2021 10:09 AM EST ----- Please share with the patient:   Benjamin Herrera,   Your triglycerides are much improved. Your bad cholesterol is still slightly above goal. Please try and make some dietary modifications. Liver function is normal.  Thanks!  Elgie Collard, PA-C

## 2021-11-20 ENCOUNTER — Telehealth (HOSPITAL_COMMUNITY): Payer: Self-pay | Admitting: *Deleted

## 2021-11-20 NOTE — Telephone Encounter (Signed)
Patient given detailed instructions per Myocardial Perfusion Study Information Sheet for the test on 11/27/21 at 10:00. Patient notified to arrive 15 minutes early and that it is imperative to arrive on time for appointment to keep from having the test rescheduled.  If you need to cancel or reschedule your appointment, please call the office within 24 hours of your appointment. . Patient verbalized understanding.Benjamin Herrera

## 2021-11-27 ENCOUNTER — Encounter (HOSPITAL_COMMUNITY): Payer: Medicaid Other

## 2021-11-30 ENCOUNTER — Telehealth (HOSPITAL_COMMUNITY): Payer: Self-pay | Admitting: *Deleted

## 2021-11-30 NOTE — Telephone Encounter (Signed)
Left message on voicemail in reference to upcoming appointment scheduled for 12/07/2021. Phone number given for a call back so details instructions can be given. Benjamin Herrera

## 2021-12-03 DIAGNOSIS — G4710 Hypersomnia, unspecified: Secondary | ICD-10-CM | POA: Diagnosis not present

## 2021-12-03 DIAGNOSIS — G4733 Obstructive sleep apnea (adult) (pediatric): Secondary | ICD-10-CM | POA: Diagnosis not present

## 2021-12-03 DIAGNOSIS — I10 Essential (primary) hypertension: Secondary | ICD-10-CM | POA: Diagnosis not present

## 2021-12-07 ENCOUNTER — Ambulatory Visit (HOSPITAL_COMMUNITY): Payer: Medicaid Other | Attending: Cardiovascular Disease

## 2021-12-07 ENCOUNTER — Other Ambulatory Visit: Payer: Self-pay

## 2021-12-07 DIAGNOSIS — I2510 Atherosclerotic heart disease of native coronary artery without angina pectoris: Secondary | ICD-10-CM | POA: Insufficient documentation

## 2021-12-07 LAB — MYOCARDIAL PERFUSION IMAGING
Base ST Depression (mm): 0 mm
LV dias vol: 136 mL (ref 62–150)
LV sys vol: 59 mL
Nuc Stress EF: 56 %
Peak HR: 96 {beats}/min
Rest HR: 68 {beats}/min
Rest Nuclear Isotope Dose: 10.2 mCi
SDS: 0
SRS: 0
SSS: 0
ST Depression (mm): 0 mm
Stress Nuclear Isotope Dose: 30.9 mCi
TID: 0.94

## 2021-12-07 MED ORDER — REGADENOSON 0.4 MG/5ML IV SOLN
0.4000 mg | Freq: Once | INTRAVENOUS | Status: AC
  Administered 2021-12-07: 0.4 mg via INTRAVENOUS

## 2021-12-07 MED ORDER — TECHNETIUM TC 99M TETROFOSMIN IV KIT
10.2000 | PACK | Freq: Once | INTRAVENOUS | Status: AC | PRN
  Administered 2021-12-07: 10.2 via INTRAVENOUS
  Filled 2021-12-07: qty 11

## 2021-12-07 MED ORDER — TECHNETIUM TC 99M TETROFOSMIN IV KIT
30.9000 | PACK | Freq: Once | INTRAVENOUS | Status: AC | PRN
  Administered 2021-12-07: 30.9 via INTRAVENOUS
  Filled 2021-12-07: qty 31

## 2021-12-09 ENCOUNTER — Other Ambulatory Visit: Payer: Self-pay

## 2021-12-09 ENCOUNTER — Encounter (HOSPITAL_BASED_OUTPATIENT_CLINIC_OR_DEPARTMENT_OTHER): Payer: Self-pay | Admitting: Family

## 2021-12-09 ENCOUNTER — Telehealth (HOSPITAL_BASED_OUTPATIENT_CLINIC_OR_DEPARTMENT_OTHER): Payer: Self-pay

## 2021-12-09 ENCOUNTER — Ambulatory Visit (HOSPITAL_BASED_OUTPATIENT_CLINIC_OR_DEPARTMENT_OTHER): Payer: Medicaid Other | Admitting: Family

## 2021-12-09 ENCOUNTER — Encounter (HOSPITAL_BASED_OUTPATIENT_CLINIC_OR_DEPARTMENT_OTHER): Payer: Self-pay

## 2021-12-09 VITALS — BP 124/70 | HR 74 | Ht 73.5 in | Wt 239.9 lb

## 2021-12-09 DIAGNOSIS — I25118 Atherosclerotic heart disease of native coronary artery with other forms of angina pectoris: Secondary | ICD-10-CM

## 2021-12-09 DIAGNOSIS — Z9989 Dependence on other enabling machines and devices: Secondary | ICD-10-CM | POA: Diagnosis not present

## 2021-12-09 DIAGNOSIS — E785 Hyperlipidemia, unspecified: Secondary | ICD-10-CM

## 2021-12-09 DIAGNOSIS — I10 Essential (primary) hypertension: Secondary | ICD-10-CM

## 2021-12-09 DIAGNOSIS — G4733 Obstructive sleep apnea (adult) (pediatric): Secondary | ICD-10-CM

## 2021-12-09 MED ORDER — AMLODIPINE BESYLATE 2.5 MG PO TABS: 2.5000 mg | ORAL_TABLET | Freq: Every day | ORAL | 3 refills | Status: DC

## 2021-12-09 MED ORDER — EZETIMIBE 10 MG PO TABS: 10.0000 mg | ORAL_TABLET | Freq: Every day | ORAL | 3 refills | Status: DC

## 2021-12-09 MED ORDER — METOPROLOL SUCCINATE ER 25 MG PO TB24: 12.5000 mg | ORAL_TABLET | Freq: Every day | ORAL | 2 refills | Status: DC

## 2021-12-09 MED ORDER — ROSUVASTATIN CALCIUM 40 MG PO TABS: 40.0000 mg | ORAL_TABLET | Freq: Every day | ORAL | 3 refills | Status: DC

## 2021-12-09 NOTE — Progress Notes (Addendum)
Office Visit    Patient Name: Benjamin Herrera Date of Encounter: 12/09/2021  PCP:  Charlott Rakes, MD   [place]  Cardiologist:  Pixie Casino, MD  Advanced Practice Provider:  No care team member to display Electrophysiologist:  None      Chief Complaint    Benjamin Herrera is a 56 y.o. male with a hx of CAD s/p stent to [place] in 2017, polysubstance abuse, hypertension, anemia, GERD, HIV presents today for follow up after myoview.    Past Medical History    Past Medical History:  Diagnosis Date   Achalasia and cardiospasm    Allergy    Arthritis    back ,shoulders    Asthma    Boils    under arms hx of   CAD (coronary artery disease)    a. 09/2016: cath showing 100% RCA occlusion with collaterals and 90% OM1 (treated with DES)   Candidiasis of mouth    Candidiasis of unspecified site    Cholelithiasis    Cocaine abuse ([place])    Quit 09/2016   Condyloma acuminatum in male of scrotum & anal canal s/p laser ablation 07/03/2012   Coronary artery disease    Dysphagia    Fracture, humerus    GERD (gastroesophageal reflux disease)    Heart attack ([place])    Hemorrhoids    Hepatitis 1993   A   Herpes labialis    HIV (human immunodeficiency virus infection) ([place]) [place]   Hypertension Dx 2014   off all bp meds for last 9 or 10 months   Pilonidal disease 01/10/2012   Polysubstance abuse ([place])    Psoriasis    hx of   Sleep apnea    does  have CPAP   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/03/2012   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 08/03/2012   Past Surgical History:  Procedure Laterality Date   BALLOON DILATION N/A 11/11/2015   Procedure: BALLOON DILATION;  Surgeon: Mauri Pole, MD;  Location: [facility];  Service: Endoscopy;  Laterality: N/A;   BOTOX INJECTION  10/11/2012   Procedure: BOTOX INJECTION;  Surgeon: Inda Castle, MD;  [facility];  Service:  Endoscopy;  Laterality: N/A;   BOTOX INJECTION N/A 01/25/2013   Procedure: BOTOX INJECTION;  Surgeon: Inda Castle, MD;  [facility];  Service: Endoscopy;  Laterality: N/A;   BOTOX INJECTION N/A 06/21/2013   Procedure: BOTOX INJECTION;  Surgeon: Inda Castle, MD;  [facility];  Service: Endoscopy;  Laterality: N/A;   BOTOX INJECTION N/A 10/08/2013   Procedure: BOTOX INJECTION;  Surgeon: Inda Castle, MD;  [facility];  Service: Endoscopy;  Laterality: N/A;   BOTOX INJECTION N/A 06/16/2015   Procedure: BOTOX INJECTION;  Surgeon: Inda Castle, MD;  [facility];  Service: Endoscopy;  Laterality: N/A;   CARDIAC CATHETERIZATION N/A 10/11/2016   Procedure: Left Heart Cath and Coronary Angiography;  Surgeon: Burnell Blanks, MD;  mRCA 100% w/ L>R collaterals, OM1 90%   CARDIAC CATHETERIZATION N/A 10/11/2016   Procedure: Coronary Stent Intervention;  Surgeon: Burnell Blanks, MD;  RESOLUTE ONYX 3.5X15 DES OM1   COLONOSCOPY  05/19/2012   Procedure: COLONOSCOPY;  Surgeon: Inda Castle, MD;  [facility];  Service: Endoscopy;  Laterality: N/A;  jill trying to contact pt to come in 0830 for 930 case, phone not accepting  messages   COLONOSCOPY     ESOPHAGEAL MANOMETRY  05/29/2012   Procedure: ESOPHAGEAL MANOMETRY (EM);  Surgeon: Inda Castle, MD;  [facility];  Service: Endoscopy;  Laterality: N/A;   ESOPHAGEAL MANOMETRY N/A 10/06/2015   Procedure: ESOPHAGEAL MANOMETRY (EM);  Surgeon: Mauri Pole, MD;  [facility];  Service: Endoscopy;  Laterality: N/A;   ESOPHAGOGASTRODUODENOSCOPY  11/11/2011   Procedure: ESOPHAGOGASTRODUODENOSCOPY (EGD);  Surgeon: Inda Castle, MD;  [facility];  Service: Endoscopy;  Laterality: N/A;  botox injection  called Pt to change time of procedure per Dr Deatra Ina   ESOPHAGOGASTRODUODENOSCOPY  03/10/2012   Procedure: ESOPHAGOGASTRODUODENOSCOPY (EGD);  Surgeon:  Inda Castle, MD;  Location: [facility];  Service: Endoscopy;  Laterality: N/A;   ESOPHAGOGASTRODUODENOSCOPY  10/11/2012   Procedure: ESOPHAGOGASTRODUODENOSCOPY (EGD);  Surgeon: Inda Castle, MD;  Location: [facility];  Service: Endoscopy;  Laterality: N/A;   ESOPHAGOGASTRODUODENOSCOPY N/A 01/25/2013   Procedure: ESOPHAGOGASTRODUODENOSCOPY (EGD);  Surgeon: Inda Castle, MD;  Location: [facility];  Service: Endoscopy;  Laterality: N/A;   ESOPHAGOGASTRODUODENOSCOPY N/A 06/21/2013   Procedure: ESOPHAGOGASTRODUODENOSCOPY (EGD);  Surgeon: Inda Castle, MD;  Location: [facility];  Service: Endoscopy;  Laterality: N/A;   ESOPHAGOGASTRODUODENOSCOPY (EGD) WITH PROPOFOL N/A 10/08/2013   Procedure: ESOPHAGOGASTRODUODENOSCOPY (EGD) WITH PROPOFOL;  Surgeon: Inda Castle, MD;  [facility];  Service: Endoscopy;  Laterality: N/A;   ESOPHAGOGASTRODUODENOSCOPY (EGD) WITH PROPOFOL N/A 06/16/2015   Procedure: ESOPHAGOGASTRODUODENOSCOPY (EGD) WITH PROPOFOL;  Surgeon: Inda Castle, MD;  [facility];  Service: Endoscopy;  Laterality: N/A;   ESOPHAGOGASTRODUODENOSCOPY (EGD) WITH PROPOFOL N/A 11/11/2015   Procedure: ESOPHAGOGASTRODUODENOSCOPY (EGD) WITH PROPOFOL;  Surgeon: Mauri Pole, MD;  [facility];  Service: Endoscopy;  Laterality: N/A;   EXAMINATION UNDER ANESTHESIA  07/24/2012   Procedure: EXAM UNDER ANESTHESIA;  Surgeon: Adin Hector, MD;  [facility];  Service: General;  Laterality: N/A;   humeral fracture surgery Right yrs ago   PILONIDAL CYST EXCISION  07/24/2012   Procedure: CYST EXCISION PILONIDAL SIMPLE;  Surgeon: Adin Hector, MD;  [facility];  Service: General;  Laterality: N/A;  Exam Under Anesthesia,, Excision Pilonidal Disease,    right shoulder replacement  2008   x 2   UPPER GASTROINTESTINAL ENDOSCOPY     WART FULGURATION  07/24/2012   Procedure: FULGURATION ANAL WART;  Surgeon: Adin Hector, MD;  [facility];   Service: General;  Laterality: N/A;  excision of raphe mass    Allergies  Allergies  Allergen Reactions   Citrus Swelling    Mouth itching and swelling     History of Present Illness    Benjamin Herrera is a 56 y.o. male with a hx of CAD s/p stent to [place] in 2017, polysubstance abuse, hypertension, anemia, GERD, HIV  last seen 11/09/2021.  Prior cardiac cath 09/2016 due to chest pain showing severe two-vessel coronary disease in the mid to distal RCA occlusion and left-to-right collaterals as well as 90% OM1 lesion which was treated with DES.  He was seen in the ED 10/29/2021 due to drug overdose.  He did not have intention to harm to himself wonders if his cocaine was laced with fentanyl.  Bystanders found her unresponsive and EMS called.  He was seen in follow-up with left-sided chest pain predominantly at night.  Chest pain was thought to be due to recent CPR the Myoview ordered which was low risk study.  He presents today for follow-up. We reviewed  his myoview which was low risk. Reports no shortness of breath nor dyspnea on exertion. Reports no chest pain, pressure, or tightness. No edema, orthopnea, PND. Reports no palpitations.  Still smoking cigarettes but denies cocaine use. He eats at home and makes his own food. He uses his air fryer. Often snacking all through the night and drinking predominantly soda. No formal exercise routine.   EKGs/Labs/Other Studies Reviewed:   The following studies were reviewed today:  [facility] 11/2021   The study is normal. The study is low risk.   No ST deviation was noted.   LV perfusion is normal. There is no evidence of ischemia. There is no evidence of infarction.   Left ventricular function is normal. Nuclear stress EF: 56 %. The left ventricular ejection fraction is normal (55-65%). End diastolic cavity size is normal. End systolic cavity size is normal.   Prior study available for comparison from 10/09/2016.  Cardiac cath 09/2016   Prox RCA  lesion, 20 %stenosed. Prox RCA to Mid RCA lesion, 99 %stenosed. Mid RCA to Dist RCA lesion, 100 %stenosed. 1st Mrg-2 lesion, 60 %stenosed. 1st Mrg-1 lesion, 90 %stenosed. Post intervention, there is a 0% residual stenosis. A STENT RESOLUTE ONYX 3.5X15 drug eluting stent was successfully placed. Ost LAD to Mid LAD lesion, 20 %stenosed.   1. Severe double vessel CAD 2. Unstable angina 3. Severe stenosis first obtuse marginal branch 4. Successful PTCA/DES x 1 OM1 5. Chronic total occlusion mid RCA. The distal vessel fills from left to right collaterals.    Recommendations: Will plan one year of DAPT with ASA and Plavix. Would start a statin. Likely discharge tomorrow. Follow up with Dr. Debara Pickett.    EKG:  No EKG today.  Recent Labs: 10/29/2021: BUN 10; Creatinine, Ser 0.83; Hemoglobin 14.9; Platelets 189; Potassium 3.7; Sodium 138 11/10/2021: ALT 13  Recent Lipid Panel    Component Value Date/Time   CHOL 158 11/10/2021 0816   TRIG 77 11/10/2021 0816   HDL 36 (L) 11/10/2021 0816   CHOLHDL 4.4 11/10/2021 0816   CHOLHDL 6.2 (H) 07/30/2020 1437   VLDL 21 01/26/2017 1526   LDLCALC 107 (H) 11/10/2021 0816   LDLCALC 124 (H) 07/30/2020 1437    Home Medications   Current Meds  Medication Sig   albuterol (VENTOLIN HFA) 108 (90 Base) MCG/ACT inhaler Inhale 2 puffs into the lungs every 6 (six) hours as needed for wheezing or shortness of breath.   aspirin 81 MG chewable tablet Chew 1 tablet (81 mg total) by mouth daily.   BIKTARVY 30-120-15 MG TABS Take 1 tablet by mouth daily.   hydrocortisone 2.5 % ointment Apply topically 2 (two) times daily. Apply around rectal area. (Patient taking differently: Apply topically as needed. Apply around rectal area.)   lidocaine (XYLOCAINE) 5 % ointment Apply 1 application topically as needed.   Naphazoline-Pheniramine (OPCON-A OP) Apply 1-2 drops to eye 2 (two) times daily as needed (dry eyes.).    nitroGLYCERIN (NITROSTAT) 0.4 MG SL tablet Place 1 tablet  (0.4 mg total) under the tongue every 5 (five) minutes as needed for chest pain.   ondansetron (ZOFRAN-ODT) 8 MG disintegrating tablet Take 1 tablet (8 mg total) by mouth every 8 (eight) hours as needed for nausea or vomiting.   pantoprazole (PROTONIX) 40 MG tablet Take 1 tablet (40 mg total) by mouth 2 (two) times daily before a meal. (Patient taking differently: Take 40 mg by mouth as needed.)   [DISCONTINUED] amLODipine (NORVASC) 2.5 MG tablet TAKE 1 TABLET  BY MOUTH EVERY DAY   [DISCONTINUED] metoprolol succinate (TOPROL-XL) 25 MG 24 hr tablet TAKE 1/2 TABLET BY MOUTH EVERY DAY   [DISCONTINUED] rosuvastatin (CRESTOR) 40 MG tablet TAKE 1 TABLET BY MOUTH EVERY DAY *PT NEEDS APPT     Review of Systems      All other systems reviewed and are otherwise negative except as noted above.  Physical Exam    VS:  BP 124/70    Pulse 74    Ht 6' 1.5" (1.867 m)    Wt 239 lb 14.4 oz (108.8 kg)    SpO2 97%    BMI 31.22 kg/m  , BMI Body mass index is 31.22 kg/m.  Wt Readings from Last 3 Encounters:  12/09/21 239 lb 14.4 oz (108.8 kg)  11/09/21 236 lb 9.6 oz (107.3 kg)  10/29/21 226 lb (102.5 kg)     GEN: Well nourished, overweight, well developed, in no acute distress. HEENT: normal. Neck: Supple, no JVD, carotid bruits, or masses. Cardiac: RRR, no murmurs, rubs, or gallops. No clubbing, cyanosis, edema.  Radials/PT 2+ and equal bilaterally.  Respiratory:  Respirations regular and unlabored, clear to auscultation bilaterally. GI: Soft, nontender, nondistended. MS: No deformity or atrophy. Skin: Warm and dry, no rash. Neuro:  Strength and sensation are intact. Psych: Normal affect.  Assessment & Plan    CAD - 2017 DES to OM1. Myoview 11/2021 low risk study. Stable with no anginal symptoms. No indication for ischemic evaluation.  GDMT includes aspirin, rosuvastatin, metoprolol. Add Zetia as LDL not at goal. Heart healthy diet and regular cardiovascular exercise encouraged.    Hypertension - BP  well controlled. Continue current antihypertensive regimen.  Refill provided. Unable to afford BP cuff, will request SW assistance.  Hyperlipidemia - 10/2020 total cholesterol 158, triglycerides 77, HDL 36, LDL 107. Continue Crestor 40mg  QD, endorses missed doses and encouraged to take regularly. Add Zetia 10mg  QD. Repeat CMP, lipid panel in 2-3 months  Polysubstance use -complete cessation advised. Still smoking cigarettes but denies cocaine use.   OSA on CPAP - CPAP compliance encouraged. Not using routinely. Discussed implications on cardiovascular health.   Obesity - Weight loss via diet and exercise encouraged. Discussed the impact being overweight would have on cardiovascular risk. Discussed referral to PREP exercise program, he wishes to think about it and will contact us if interested.   Disposition: Follow up in 6 month(s) with Pixie Casino, MD or APP.  Signed, Loel Dubonnet, NP 12/09/2021, 11:41 AM [place]

## 2021-12-09 NOTE — Telephone Encounter (Addendum)
Attempted to call patient, number is no longer in service at this time, will mail results to patient!    ----- Message from Elgie Collard, PA-C sent at 12/08/2021  1:40 PM EST ----- Please have the patient know:   Mr. Grays your stress test is normal.  No EKG changes were noted.  This is a good result!  If you have any questions please let me know.  Take care,  Elgie Collard, PA-C

## 2021-12-09 NOTE — Patient Instructions (Signed)
Medication Instructions:  Your physician has recommended you make the following change in your medication:   START Zetia (Ezetimibe) one 10mg  tablet daily  *If you need a refill on your cardiac medications before your next appointment, please call your pharmacy*  Lab Work: Your physician recommends that you return for lab work in 2-3 months for fasting lipid panel and CMET.  If you have labs (blood work) drawn today and your tests are completely normal, you will receive your results only by: [place] (if you have MyChart) OR A paper copy in the mail If you have any lab test that is abnormal or we need to change your treatment, we will call you to review the results.  Testing/Procedures: Your stress test was low risk study. It showed no blockage and normal heart pumping function which is a good result.   Follow-Up: At [facility], you and your health needs are our priority.  As part of our continuing mission to provide you with exceptional heart care, we have created designated Provider Care Teams.  These Care Teams include your primary Cardiologist (physician) and Advanced Practice Providers (APPs -  Physician Assistants and Nurse Practitioners) who all work together to provide you with the care you need, when you need it.  We recommend signing up for the patient portal called "MyChart".  Sign up information is provided on this After Visit Summary.  MyChart is used to connect with patients for Virtual Visits (Telemedicine).  Patients are able to view lab/test results, encounter notes, upcoming appointments, etc.  Non-urgent messages can be sent to your provider as well.   To learn more about what you can do with MyChart, go to [email or web address].    Your next appointment:   6 month(s)  The format for your next appointment:   In Person  Provider:   Pixie Casino, MD or Advanced Practice Provider    Other Instructions  Heart Healthy Diet Recommendations: A low-salt  diet is recommended. Meats should be grilled, baked, or boiled. Avoid fried foods. Focus on lean protein sources like fish or chicken with vegetables and fruits. The American Heart Association is a Microbiologist!  American Heart Association Diet and Lifeystyle Recommendations    Exercise recommendations: The American Heart Association recommends 150 minutes of moderate intensity exercise weekly. Try 30 minutes of moderate intensity exercise 4-5 times per week. This could include walking, jogging, or swimming.  Mediterranean Diet A Mediterranean diet refers to food and lifestyle choices that are based on the traditions of countries located on the [employer]. It focuses on eating more fruits, vegetables, whole grains, beans, nuts, seeds, and heart-healthy fats, and eating less dairy, meat, eggs, and processed foods with added sugar, salt, and fat. This way of eating has been shown to help prevent certain conditions and improve outcomes for people who have chronic diseases, like kidney disease and heart disease. What are tips for following this plan? Reading food labels Check the serving size of packaged foods. For foods such as rice and pasta, the serving size refers to the amount of cooked product, not dry. Check the total fat in packaged foods. Avoid foods that have saturated fat or trans fats. Check the ingredient list for added sugars, such as corn syrup. Shopping  Buy a variety of foods that offer a balanced diet, including: Fresh fruits and vegetables (produce). Grains, beans, nuts, and seeds. Some of these may be available in unpackaged forms or large amounts (in bulk). Fresh seafood. Poultry and eggs.  Low-fat dairy products. Buy whole ingredients instead of prepackaged foods. Buy fresh fruits and vegetables in-season from local farmers markets. Buy plain frozen fruits and vegetables. If you do not have access to quality fresh seafood, buy precooked frozen shrimp or canned fish,  such as tuna, salmon, or sardines. Stock your pantry so you always have certain foods on hand, such as olive oil, canned tuna, canned tomatoes, rice, pasta, and beans. Cooking Cook foods with extra-virgin olive oil instead of using butter or other vegetable oils. Have meat as a side dish, and have vegetables or grains as your main dish. This means having meat in small portions or adding small amounts of meat to foods like pasta or stew. Use beans or vegetables instead of meat in common dishes like chili or lasagna. Experiment with different cooking methods. Try roasting, broiling, steaming, and sauting vegetables. Add frozen vegetables to soups, stews, pasta, or rice. Add nuts or seeds for added healthy fats and plant protein at each meal. You can add these to yogurt, salads, or vegetable dishes. Marinate fish or vegetables using olive oil, lemon juice, garlic, and fresh herbs. Meal planning Plan to eat one vegetarian meal one day each week. Try to work up to two vegetarian meals, if possible. Eat seafood two or more times a week. Have healthy snacks readily available, such as: Vegetable sticks with hummus. Greek yogurt. Fruit and nut trail mix. Eat balanced meals throughout the week. This includes: Fruit: 2-3 servings a day. Vegetables: 4-5 servings a day. Low-fat dairy: 2 servings a day. Fish, poultry, or lean meat: 1 serving a day. Beans and legumes: 2 or more servings a week. Nuts and seeds: 1-2 servings a day. Whole grains: 6-8 servings a day. Extra-virgin olive oil: 3-4 servings a day. Limit red meat and sweets to only a few servings a month. Lifestyle  Cook and eat meals together with your family, when possible. Drink enough fluid to keep your urine pale yellow. Be physically active every day. This includes: Aerobic exercise like running or swimming. Leisure activities like gardening, walking, or housework. Get 7-8 hours of sleep each night. If recommended by your health  care provider, drink red wine in moderation. This means 1 glass a day for nonpregnant women and 2 glasses a day for men. A glass of wine equals 5 oz (150 mL). What foods should I eat? Fruits Apples. Apricots. Avocado. Berries. Bananas. Cherries. Dates. Figs. Grapes. Lemons. Melon. Oranges. Peaches. Plums. Pomegranate. Vegetables Artichokes. Beets. Broccoli. Cabbage. Carrots. Eggplant. Green beans. Chard. Kale. Spinach. Onions. Leeks. Peas. Squash. Tomatoes. Peppers. Radishes. Grains Whole-grain pasta. Brown rice. Bulgur wheat. Polenta. Couscous. Whole-wheat bread. [place]. Meats and other proteins Beans. Almonds. Sunflower seeds. Pine nuts. Peanuts. [place]. Salmon. Scallops. Shrimp. [place]. Tilapia. Clams. Oysters. Eggs. Poultry without skin. Dairy Low-fat milk. Cheese. Greek yogurt. Fats and oils Extra-virgin olive oil. Avocado oil. Grapeseed oil. Beverages Water. Red wine. Herbal tea. Sweets and desserts Greek yogurt with honey. Baked apples. Poached pears. Trail mix. Seasonings and condiments Basil. Cilantro. Coriander. Cumin. Mint. Parsley. Sage. Rosemary. Tarragon. Garlic. Oregano. Thyme. Pepper. Balsamic vinegar. Tahini. Hummus. Tomato sauce. Olives. Mushrooms. The items listed above may not be a complete list of foods and beverages you can eat. Contact a dietitian for more information. What foods should I limit? This is a list of foods that should be eaten rarely or only on special occasions. Fruits Fruit canned in syrup. Vegetables Deep-fried potatoes (french fries). Grains Prepackaged pasta or rice dishes. Prepackaged cereal with  added sugar. Prepackaged snacks with added sugar. Meats and other proteins Beef. Pork. Lamb. Poultry with skin. Hot dogs. Berniece Salines. Dairy Ice cream. Sour cream. Whole milk. Fats and oils Butter. Canola oil. Vegetable oil. Beef fat (tallow). Lard. Beverages Juice. Sugar-sweetened soft drinks. Beer. Liquor and spirits. Sweets and  desserts Cookies. Cakes. Pies. Candy. Seasonings and condiments Mayonnaise. Pre-made sauces and marinades. The items listed above may not be a complete list of foods and beverages you should limit. Contact a dietitian for more information. Summary The Mediterranean diet includes both food and lifestyle choices. Eat a variety of fresh fruits and vegetables, beans, nuts, seeds, and whole grains. Limit the amount of red meat and sweets that you eat. If recommended by your health care provider, drink red wine in moderation. This means 1 glass a day for nonpregnant women and 2 glasses a day for men. A glass of wine equals 5 oz (150 mL). This information is not intended to replace advice given to you by your health care provider. Make sure you discuss any questions you have with your health care provider. Document Revised: 11/16/2019 Document Reviewed: 09/13/2019 Elsevier Patient Education  2022 [employer].

## 2021-12-31 DIAGNOSIS — I10 Essential (primary) hypertension: Secondary | ICD-10-CM | POA: Diagnosis not present

## 2021-12-31 DIAGNOSIS — G4733 Obstructive sleep apnea (adult) (pediatric): Secondary | ICD-10-CM | POA: Diagnosis not present

## 2021-12-31 DIAGNOSIS — G4710 Hypersomnia, unspecified: Secondary | ICD-10-CM | POA: Diagnosis not present

## 2022-01-21 ENCOUNTER — Other Ambulatory Visit: Payer: Self-pay

## 2022-01-21 ENCOUNTER — Encounter: Payer: Self-pay | Admitting: Infectious Diseases

## 2022-01-21 ENCOUNTER — Ambulatory Visit (INDEPENDENT_AMBULATORY_CARE_PROVIDER_SITE_OTHER): Payer: Medicaid Other | Admitting: Infectious Diseases

## 2022-01-21 DIAGNOSIS — D0761 Carcinoma in situ of scrotum: Secondary | ICD-10-CM | POA: Diagnosis not present

## 2022-01-21 DIAGNOSIS — B20 Human immunodeficiency virus [HIV] disease: Secondary | ICD-10-CM

## 2022-01-21 DIAGNOSIS — F5101 Primary insomnia: Secondary | ICD-10-CM | POA: Diagnosis not present

## 2022-01-21 MED ORDER — TRAMADOL HCL 50 MG PO TABS: 50.0000 mg | ORAL_TABLET | Freq: Four times a day (QID) | ORAL | 0 refills | Status: DC | PRN

## 2022-01-21 MED ORDER — ZOLPIDEM TARTRATE 10 MG PO TABS: 10.0000 mg | ORAL_TABLET | Freq: Every evening | ORAL | 0 refills | Status: DC | PRN

## 2022-01-21 NOTE — Assessment & Plan Note (Signed)
He is taking his ART well.  ?And his labs are good.  ?He is given condoms ?Will see him back in 2 months.  ?

## 2022-01-21 NOTE — Assessment & Plan Note (Signed)
Will refill his ambien.  ?

## 2022-01-21 NOTE — Assessment & Plan Note (Addendum)
Will get him back in with [facility] (has seen Dr Johney Maine prior).  ?Will give him 5 tramadol to tide him over. He has not received narcotics in the last year.  ?PDMP checked.  ?This is a one time rx to keep him from going to ED.  ?

## 2022-01-21 NOTE — Progress Notes (Signed)
? ?Subjective:  ? ? Patient ID: Benjamin Herrera, male  DOB: 08-24-1966, 56 y.o.        MRN: [id number] ? ? ?HPI ?56 yo M with HIV+, anal condyloma, achalasia, HTN. R shoulder pain (previous shoulder prosthesis).  He still has pain from this and has been told they will not remove. ?Prev had genotype on 05-29-12 showing M184V. Was taking TRV/ISN, had DRVr added at his visit 06-09-12. Then changed to tivicay and complera. At his f/u 09-2015 was changed to genvoya/darunavir due to need for ppi.  ?He underwent stress test (positive) then cath and PTCA of OM1 (09-2016). 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 He was started on beta-blocker and crestor at his CV f/u (01-21-17). ?  ?No problems with his ART (biktarvy).  ?  ?Had appt "balloon dilation of my esophagus" because of n/v, wt loss.  ?He feels like his swallowing is worse as this has not been done yet and his "swallowing is not right". "Just sits in my throat".  ? ?Was seen in ED 10-29-21 for accidental drug o/d after being found by bystanders. He thought this was due to cocaine.  ?He was given narcan and woke up.  ?States he has been staying clean since then. "I don' have a problem it was just something stupid I did at a party".  ? ?C/o continued pain in his R shoulder, L knee and bottom. He states area on his bottom where the skin cancer was removed hurts and itches.  ? ?HIV 1 RNA Quant  ?Date Value  ?04/07/2021 27 Copies/mL (H)  ?07/30/2020 6,280 Copies/mL (H)  ?11/07/2019 366 copies/mL (H)  ? ?CD4 T Cell Abs (/uL)  ?Date Value  ?04/07/2021 318 (L)  ?07/30/2020 291 (L)  ?11/07/2019 465  ? ? ? ?Health Maintenance  ?Topic Date Due  ?? Zoster Vaccines- Shingrix (1 of 2) Never done  ?? COVID-19 Vaccine (3 - Pfizer risk series) 07/23/2020  ?? TETANUS/TDAP  03/16/2021  ?? INFLUENZA VACCINE  05/25/2021  ?? COLONOSCOPY (Pts 45-62yr Insurance coverage will need to be confirmed)  05/19/2022  ?? Hepatitis C Screening  Completed  ?? HIV Screening  Completed  ?? HPV  VACCINES  Aged Out  ? ? ? ? ?Review of Systems  ?Constitutional:  Negative for chills, fever and weight loss.  ?Respiratory:  Negative for cough and shortness of breath.   ?Gastrointestinal:  Negative for blood in stool, constipation and diarrhea.  ?Musculoskeletal:  Positive for joint pain.  ? ?Please see HPI. All other systems reviewed and negative. ? ?   ?Objective:  ?Physical Exam ?Vitals reviewed.  ?Constitutional:   ?   General: He is not in acute distress. ?   Appearance: He is not toxic-appearing.  ?HENT:  ?   Mouth/Throat:  ?   Mouth: Mucous membranes are moist.  ?   Pharynx: No oropharyngeal exudate.  ?Eyes:  ?   Extraocular Movements: Extraocular movements intact.  ?   Pupils: Pupils are equal, round, and reactive to light.  ?Cardiovascular:  ?   Rate and Rhythm: Normal rate and regular rhythm.  ?Pulmonary:  ?   Effort: Pulmonary effort is normal.  ?   Breath sounds: Normal breath sounds.  ?Abdominal:  ?   General: Bowel sounds are normal. There is no distension.  ?   Palpations: Abdomen is soft.  ?   Tenderness: There is no abdominal tenderness.  ?Genitourinary: ? ? ?   Comments: Warts, scar, mild erythema.  ?  Musculoskeletal:  ?   Cervical back: Normal range of motion and neck supple.  ?   Right lower leg: No edema.  ?   Left lower leg: No edema.  ?Lymphadenopathy:  ?   Cervical: No cervical adenopathy.  ?Neurological:  ?   Mental Status: He is alert.  ? ? ? ? ? ? ?   ?Assessment & Plan:  ? ?

## 2022-01-31 DIAGNOSIS — I10 Essential (primary) hypertension: Secondary | ICD-10-CM | POA: Diagnosis not present

## 2022-01-31 DIAGNOSIS — G4710 Hypersomnia, unspecified: Secondary | ICD-10-CM | POA: Diagnosis not present

## 2022-01-31 DIAGNOSIS — G4733 Obstructive sleep apnea (adult) (pediatric): Secondary | ICD-10-CM | POA: Diagnosis not present

## 2022-02-01 ENCOUNTER — Telehealth: Payer: Self-pay

## 2022-02-01 ENCOUNTER — Ambulatory Visit: Payer: Self-pay

## 2022-02-01 NOTE — Telephone Encounter (Signed)
Patient called stating that he is in a lot of pain and his appointment to see General Surgery is on May 8th and he can't wait that long due to the amount of pain he is having. Advised patient I would send a message back to Dr.Hatcher and referral coordinator. Patient also stated that Tramadol wasn't helping with pain - advised him to call his PCP office.  ? ? ?Denese Mentink P Emanii Bugbee, CMA ? ?

## 2022-02-01 NOTE — Telephone Encounter (Addendum)
?  Chief Complaint: back pain ?Symptoms: back pain in lower buttock region, constant, better when sitting ?Frequency: 3 weeks ?Pertinent Negatives: Patient denies radiation ?Disposition: '[]'$ ED /'[]'$ Urgent Care (no appt availability in office) / '[x]'$ Appointment(In office/virtual)/ '[]'$  [place]/ '[]'$ Home Care/ '[]'$ Refused Recommended Disposition /'[]'$ [place]/ '[]'$  Follow-up with PCP ?Additional Notes: pt states he has tried the Tramadol and Ibuprofen and nothing helps. Pt states he is unable to use heat/ice d/t location of pain. Advised appt would be appropriate to see what's going on. Pt was already scheduled for 02/03/22 at 1320 with Cari. PA. Pt verbalized he will be at appt.  ? ?Summary: Back pain x3weeks  ? Pt called and stated that he has had back pain foe x3 weeks with no relief. Unable to get patient in with PCP. Please advise on what the patient should do.   ?  ? ?Reason for Disposition ? [1] SEVERE back pain (e.g., excruciating, unable to do any normal activities) AND [2] not improved 2 hours after pain medicine ? ?Answer Assessment - Initial Assessment Questions ?1. ONSET: "When did the pain begin?"  ?    3 weeks  ?2. LOCATION: "Where does it hurt?" (upper, mid or lower back) ?    Lower back buttock region ?3. SEVERITY: "How bad is the pain?"  (e.g., Scale 1-10; mild, moderate, or severe) ?  - MILD (1-3): doesn't interfere with normal activities  ?  - MODERATE (4-7): interferes with normal activities or awakens from sleep  ?  - SEVERE (8-10): excruciating pain, unable to do any normal activities  ?    Sitting at 4, 12/10 when moving ?4. PATTERN: "Is the pain constant?" (e.g., yes, no; constant, intermittent)  ?    Constant  ?5. RADIATION: "Does the pain shoot into your legs or elsewhere?" ?    No ?8. MEDICATIONS: "What have you taken so far for the pain?" (e.g., nothing, acetaminophen, NSAIDS) ?    Tramadol, ibuprofen but not helping ?9. NEUROLOGIC SYMPTOMS: "Do you have any weakness,  numbness, or problems with bowel/bladder control?" ?    No ?10. OTHER SYMPTOMS: "Do you have any other symptoms?" (e.g., fever, abdominal pain, burning with urination, blood in urine) ?      No ? ?Protocols used: Back Pain-A-AH ? ?

## 2022-02-02 NOTE — Telephone Encounter (Signed)
Patient has appointment with PCP tomorrow to discuss alternative pain medication. Patient was put on wait list to see if he can get sooner appointment with Surgeon.  ? ? ?Benjamin Herrera P Blue Ruggerio, CMA ? ?

## 2022-02-03 ENCOUNTER — Encounter: Payer: Self-pay | Admitting: Physician Assistant

## 2022-02-03 ENCOUNTER — Ambulatory Visit (INDEPENDENT_AMBULATORY_CARE_PROVIDER_SITE_OTHER): Payer: Medicaid Other | Admitting: Physician Assistant

## 2022-02-03 VITALS — BP 157/83 | HR 88 | Temp 98.2°F | Resp 18 | Ht 73.0 in | Wt 228.0 lb

## 2022-02-03 DIAGNOSIS — D0761 Carcinoma in situ of scrotum: Secondary | ICD-10-CM | POA: Diagnosis not present

## 2022-02-03 DIAGNOSIS — R102 Pelvic and perineal pain: Secondary | ICD-10-CM

## 2022-02-03 MED ORDER — OXYCODONE-ACETAMINOPHEN 5-325 MG PO TABS: 1.0000 | ORAL_TABLET | ORAL | 0 refills | Status: AC | PRN

## 2022-02-03 NOTE — Progress Notes (Signed)
Patient took medication yesterday and patient has eaten today. ?Patient reports back pain for the past 3 weeks. Patient reports tramadol has not provided relief. ?

## 2022-02-03 NOTE — Patient Instructions (Addendum)
You are going to start using oxycodone every 4 hours as needed.  Make sure that you are staying very well-hydrated and eating plenty of fiber, this medication can cause constipation. ? ?I strongly encourage you to follow-up with the surgeon as soon as you are able ? ?Kennieth Rad, PA-C ?Physician Assistant ?[place] ?[email or web address] ? ? ?Oxycodone; Aspirin Tablets ?What is this medication? ?OXYCODONE; ASPIRIN (ox i KOE done; AS pir in) treats severe pain. It is prescribed when other pain medications have not worked or cannot be tolerated. It works by blocking pain signals in the brain. It also decreases inflammation. It is a combination of an opioid and an NSAID. ?This medicine may be used for other purposes; ask your health care provider or pharmacist if you have questions. ?COMMON BRAND NAME(S): Endodan, Percodan, Percodan-Demi ?What should I tell my care team before I take this medication? ?They need to know if you have any of these conditions: ?Bleeding disorder ?Brain tumor ?Constipation ?Frequently drink alcohol ?Head injury ?Heart disease ?Low adrenal gland function ?Lung disease, asthma, or breathing problem ?Seizures ?Stomach or intestine problems ?Substance use disorder ?Taken an MAOI like Marplan, Nardil, or Parnate in the last 14 days ?An unusual or allergic reaction to aspirin, oxycodone, other medications, foods, dyes, or preservative ?Pregnant or trying to get pregnant ?Breast-feeding ?How should I use this medication? ?Take this medication by mouth with a full glass of water. Take it as directed on the prescription label at the same time every day. You can take this medication with or without food. If it upsets your stomach, take it with food. Keep taking it unless your care team tells you to stop. ?A special MedGuide will be given to you by the pharmacist with each prescription and refill. Be sure to read this information carefully each  time. ?Talk to your care team about the use of this medication in children. This medication is not approved for use in children. ?People over the age of 15 years may have a stronger reaction and need a smaller dose. ?Overdosage: If you think you have taken too much of this medicine contact a poison control center or emergency room at once. ?NOTE: This medicine is only for you. Do not share this medicine with others. ?What if I miss a dose? ?If you miss a dose, take it as soon as you can. If it is almost time for your next dose, take only that dose. Do not take double or extra doses. ?What may interact with this medication? ?Do not take this medication with any of the following: ?Cidofovir ?Defibrotide ?Ketorolac ?Methotrexate ?Probenecid ?This medication may interact with the following: ?Alcohol ?Antihistamines for allergy, cough and cold ?Antiviral medications for HIV or AIDS ?Atropine ?Certain antibiotics like clarithromycin, erythromycin, linezolid, rifampin ?Certain medications for anxiety or sleep ?Certain medications for bladder problems like oxybutynin, tolterodine ?Certain medications for blood pressure, heart disease, irregular heart beat ?Certain medications for depression like amitriptyline, fluoxetine, sertraline ?Certain medications for fungal infections like ketoconazole, itraconazole, voriconazole ?Certain medications for migraine headache like almotriptan, eletriptan, frovatriptan, naratriptan, rizatriptan, sumatriptan, zolmitriptan ?Certain medications for nausea or vomiting like dolasetron, ondansetron, palonosetron ?Certain medications for Parkinson's disease like benztropine, trihexyphenidyl ?Certain medications for seizures like phenobarbital, primidone ?Certain medications for stomach problems like dicyclomine, hyoscyamine ?Certain medications for travel sickness like scopolamine ?Certain medications that treat or prevent blood clots like warfarin, enoxaparin, dalteparin, apixaban, dabigatran,  and rivaroxaban ?Diuretics ?General anesthetics like halothane, isoflurane, methoxyflurane, propofol ?Ipratropium ?Local anesthetics like  lidocaine, pramoxine, tetracaine ?MAOIs like Carbex, Eldepryl, Marplan, Nardil, and Parnate ?Medications that relax muscles for surgery ?Methylene blue ?Nilotinib ?NSAIDs, medications for pain and inflammation, like ibuprofen or naproxen ?Other opioid medications for pain or cough ?Phenothiazines like chlorpromazine, mesoridazine, prochlorperazine, thioridazine ?Sulfinpyrazone ?This list may not describe all possible interactions. Give your health care provider a list of all the medicines, herbs, non-prescription drugs, or dietary supplements you use. Also tell them if you smoke, drink alcohol, or use illegal drugs. Some items may interact with your medicine. ?What should I watch for while using this medication? ?Tell your care team if your pain does not go away, if it gets worse, or if you have new or a different type of pain. You may develop tolerance to this medication. Tolerance means that you will need a higher dose of the medication for pain relief. Tolerance is normal and is expected if you take this medication for a long time. ?Do not suddenly stop taking your medication because you may develop a severe reaction. Your body becomes used to the medication. This does NOT mean you are addicted. Addiction is a behavior related to getting and using a medication for a nonmedical reason. If you have pain, you have a medical reason to take pain medication. Your care team will tell you how much medication to take. If your care team wants you to stop the medication, the dose will be slowly lowered over time to avoid any side effects. ?If you take other medications that also cause drowsiness like other opioid pain medications, benzodiazepines, or other medications for sleep, you may have more side effects. Give your care team a list of all medications you use. They will tell you how  much medication to take. Do not take more medication than directed. Get emergency help right away if you have trouble breathing or are unusually tired or sleepy. ?Naloxone is an emergency medication used for an opioid overdose. An overdose can happen if you take too much opioid. It can also happen if an opioid is taken with some other medications or substances such as alcohol. Know the symptoms of an overdose, like trouble breathing, unusually tired or sleepy, or not being able to respond or wake up. Make sure to tell caregivers and close contacts where your naloxone stored. Make sure they know how to use it. After naloxone is given, the person giving it must call emergency services. Naloxone is a temporary treatment. Repeat doses may be needed. ?This medication will cause constipation. If you do not have a bowel movement for 3 days, call your care team. ?Check with your care team if you have severe diarrhea, nausea, and vomiting, or if you sweat a lot. The loss of too much body fluid may make it dangerous for you to take this medication. ?Do not take other medications that contain aspirin, ibuprofen, or naproxen with this medication. Side effects such as stomach upset, nausea, or ulcers may be more likely to occur. Many non-prescription medications contain aspirin, ibuprofen, or naproxen. Always read labels carefully. ?This medication can cause serious ulcers and bleeding in the stomach. It can happen with no warning. Tobacco, alcohol, older age, and poor health can also increase risks. Call your care team right away if you have stomach pain or blood in your vomit or stool. ?This medication may cause serious skin reactions. They can happen weeks to months after starting the medication. Contact your care team right away if you notice fevers or flu-like symptoms with a rash.  The rash may be red or purple and then turn into blisters or peeling of the skin. Or, you might notice a red rash with swelling of the face,  lips or lymph nodes in your neck or under your arms. ?Talk to your care team if you wish to become pregnant or think you might be pregnant. This medication can cause serious birth defects. ?This medicati

## 2022-02-03 NOTE — Progress Notes (Signed)
? ?Established Patient Office Visit ? ?Subjective:  ?Patient ID: Benjamin Herrera, male    DOB: 10/17/1966  Age: 56 y.o. MRN: [id number] ? ?CC:  ?Chief Complaint  ?Patient presents with  ? Back Pain  ? ? ?HPI ?[name] reports that he was treated for skin cancer of the skin of his perineum near his scrotum in October 2013.  Unfortunately the skin cancer has returned.  States that he was seen by infectious disease and this was confirmed.  States that the site is very painful, states that he was given 5 tramadol by infectious disease.  States that the tramadol is not offering relief. ? ?States that he does have an appointment with surgeon but unfortunately is not until Mar 01, 2022. ? ?Note from infectious disease ? ?   ?  ? ? Assessment & Plan Note by Campbell Riches, MD at 01/21/2022 4:01 PM ? 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 Campbell Riches, MD (Physician) at 01/21/2022  4:01 PM - Written  ?Will get him back in with [facility] (has seen Dr Johney Maine prior).  ?Will give him 5 tramadol to tide him over. He has not received narcotics in the last year.  ?PDMP checked.  ?This is a one time rx to keep him from going to ED.  ?  ?  ? ? ? ? ?Past Medical History:  ?Diagnosis Date  ? Achalasia and cardiospasm   ? Allergy   ? Arthritis   ? back ,shoulders   ? Asthma   ? Boils   ? under arms hx of  ? CAD (coronary artery disease)   ? a. 09/2016: cath showing 100% RCA occlusion with collaterals and 90% OM1 (treated with DES)  ? Candidiasis of mouth   ? Candidiasis of unspecified site   ? Cholelithiasis   ? Cocaine abuse ([place])   ? Quit 09/2016  ? Condyloma acuminatum in male of scrotum & anal canal s/p laser ablation 07/03/2012  ? Coronary artery  disease   ? Dysphagia   ? Fracture, humerus   ? GERD (gastroesophageal reflux disease)   ? Heart attack ([place])   ? Hemorrhoids   ? Hepatitis 1993  ? A  ? Herpes labialis   ? HIV (human immunodeficiency virus infection) ([place]) [place]  ? Hypertension Dx 2014  ? off all bp meds for last 9 or 10 months  ? Pilonidal disease 01/10/2012  ? Polysubstance abuse ([place])   ? Psoriasis   ? hx of  ? Sleep apnea   ? does  have CPAP  ? 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/03/2012  ? 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 ? ?Past Surgical History:  ?Procedure Laterality Date  ? BALLOON DILATION N/A 11/11/2015  ? Procedure: BALLOON DILATION;  Surgeon: Mauri Pole, MD;  Location: [facility];  Service: Endoscopy;  Laterality: N/A;  ? BOTOX INJECTION  10/11/2012  ? Procedure: BOTOX INJECTION;  Surgeon: Inda Castle, MD;  [facility]: [facility];  Service: Endoscopy;  Laterality: N/A;  ? BOTOX INJECTION N/A 01/25/2013  ? Procedure: BOTOX INJECTION;  Surgeon: Inda Castle, MD;  [facility]:  [facility];  Service: Endoscopy;  Laterality: N/A;  ? BOTOX INJECTION N/A 06/21/2013  ? Procedure: BOTOX INJECTION;  Surgeon: Inda Castle, MD;  [facility];  Service: Endoscopy;  Laterality: N/A;  ? BOTOX INJECTION N/A 10/08/2013  ? Procedure: BOTOX INJECTION;  Surgeon: Inda Castle, MD;  [facility];  Service: Endoscopy;  Laterality: N/A;  ? BOTOX INJECTION N/A 06/16/2015  ? Procedure: BOTOX INJECTION;  Surgeon: Inda Castle, MD;  [facility];  Service: Endoscopy;  Laterality: N/A;  ? CARDIAC CATHETERIZATION N/A 10/11/2016  ? Procedure: Left Heart Cath and Coronary Angiography;  Surgeon: Burnell Blanks, MD;  mRCA 100% w/ L>R collaterals, OM1 90%  ? CARDIAC CATHETERIZATION N/A 10/11/2016  ? Procedure: Coronary Stent Intervention;  Surgeon: Burnell Blanks, MD;  RESOLUTE ONYX 3.5X15 DES OM1  ? COLONOSCOPY  05/19/2012   ? Procedure: COLONOSCOPY;  Surgeon: Inda Castle, MD;  [facility];  Service: Endoscopy;  Laterality: N/A;  jill trying to contact pt to come in 0830 for 930 case, phone not accepting messages  ? COLONOSCOPY    ? ESOPHAGEAL MANOMETRY  05/29/2012  ? Procedure: ESOPHAGEAL MANOMETRY (EM);  Surgeon: Inda Castle, MD;  [facility];  Service: Endoscopy;  Laterality: N/A;  ? ESOPHAGEAL MANOMETRY N/A 10/06/2015  ? Procedure: ESOPHAGEAL MANOMETRY (EM);  Surgeon: Mauri Pole, MD;  [facility];  Service: Endoscopy;  Laterality: N/A;  ? ESOPHAGOGASTRODUODENOSCOPY  11/11/2011  ? Procedure: ESOPHAGOGASTRODUODENOSCOPY (EGD);  Surgeon: Inda Castle, MD;  [facility]: [facility];  Service: Endoscopy;  Laterality: N/A;  botox injection ? called Pt to change time of procedure per Dr Deatra Ina  ? ESOPHAGOGASTRODUODENOSCOPY  03/10/2012  ? Procedure: ESOPHAGOGASTRODUODENOSCOPY (EGD);  Surgeon: Inda Castle, MD;  Location: [facility];  Service: Endoscopy;  Laterality: N/A;  ? ESOPHAGOGASTRODUODENOSCOPY  10/11/2012  ? Procedure: ESOPHAGOGASTRODUODENOSCOPY (EGD);  Surgeon: Inda Castle, MD;  Location: [facility];  Service: Endoscopy;  Laterality: N/A;  ? ESOPHAGOGASTRODUODENOSCOPY N/A 01/25/2013  ? Procedure: ESOPHAGOGASTRODUODENOSCOPY (EGD);  Surgeon: Inda Castle, MD;  Location: [facility];  Service: Endoscopy;  Laterality: N/A;  ? ESOPHAGOGASTRODUODENOSCOPY N/A 06/21/2013  ? Procedure: ESOPHAGOGASTRODUODENOSCOPY (EGD);  Surgeon: Inda Castle, MD;  Location: [facility];  Service: Endoscopy;  Laterality: N/A;  ? ESOPHAGOGASTRODUODENOSCOPY (EGD) WITH PROPOFOL N/A 10/08/2013  ? Procedure: ESOPHAGOGASTRODUODENOSCOPY (EGD) WITH PROPOFOL;  Surgeon: Inda Castle, MD;  [facility];  Service: Endoscopy;  Laterality: N/A;  ? ESOPHAGOGASTRODUODENOSCOPY (EGD) WITH PROPOFOL N/A 06/16/2015  ? Procedure: ESOPHAGOGASTRODUODENOSCOPY (EGD) WITH PROPOFOL;  Surgeon: Inda Castle, MD;   [facility];  Service: Endoscopy;  Laterality: N/A;  ? ESOPHAGOGASTRODUODENOSCOPY (EGD) WITH PROPOFOL N/A 11/11/2015  ? Procedure: ESOPHAGOGASTRODUODENOSCOPY (EGD) WITH PROPOFOL;  Surgeon: Mauri Pole, MD;  [facility];  Service: Endoscopy;  Laterality: N/A;  ? EXAMINATION UNDER ANESTHESIA  07/24/2012  ? Procedure: EXAM UNDER ANESTHESIA;  Surgeon: Adin Hector, MD;  [facility]: [facility];  Service: General;  Laterality: N/A;  ? humeral fracture surgery Right yrs ago  ? PILONIDAL CYST EXCISION  07/24/2012  ? Procedure: CYST EXCISION PILONIDAL SIMPLE;  Surgeon: Adin Hector, MD;  [facility]: [facility];  Service: General;  Laterality: N/A;  Exam Under Anesthesia,, Excision Pilonidal Disease,   ? right shoulder replacement  2008  ? x 2  ? UPPER GASTROINTESTINAL ENDOSCOPY    ? WART FULGURATION  07/24/2012  ? Procedure: FULGURATION ANAL WART;  Surgeon: Adin Hector, MD;  [facility]: [facility];  Service: General;  Laterality: N/A;  excision of raphe mass  ? ? ?Family History  ?Problem Relation Age of Onset  ? Arthritis Mother   ? Hypertension Mother   ? Heart disease Mother   ?     MI age 21  ? Diabetes Maternal Grandmother   ? Stomach cancer Paternal Grandmother   ? Colon cancer Neg Hx   ? Colon polyps Neg Hx   ? Esophageal cancer Neg Hx   ? Rectal cancer Neg Hx   ? ? ?Social History  ? ?Socioeconomic History  ? Marital status: Single  ?  Spouse name: Not on file  ? Number of children: Not on file  ? Years of education: Not on file  ? Highest education level: Not on file  ?Occupational History  ? Occupation: Disabled  ?Tobacco Use  ? Smoking status: Every Day  ?  Packs/day: 0.25  ?  Years: 25.00  ?  Pack years: 6.25  ?  Types: Cigarettes  ? Smokeless tobacco: Never  ? Tobacco comments:  ?  4 or 5 cigarettes a day   ?Substance and Sexual Activity  ? Alcohol use: No  ?  Alcohol/week: 0.0 standard drinks  ? Drug use: Yes  ?  Frequency: 3.0 times per week  ?  Types: Marijuana, Cocaine  ? Sexual activity:  Not Currently  ?  Partners: Male  ?  Comment: pt. given condoms, used marijuana -3 days ago 05/14/16  ?Other Topics Concern  ? Not on file  ?Social History Narrative  ? Pt lives in a ground floor apartmen

## 2022-02-04 ENCOUNTER — Other Ambulatory Visit: Payer: Self-pay | Admitting: Physician Assistant

## 2022-02-04 ENCOUNTER — Telehealth: Payer: Self-pay | Admitting: Family Medicine

## 2022-02-04 DIAGNOSIS — D0761 Carcinoma in situ of scrotum: Secondary | ICD-10-CM

## 2022-02-04 MED ORDER — OXYCODONE HCL ER 10 MG PO T12A: 10.0000 mg | EXTENDED_RELEASE_TABLET | Freq: Two times a day (BID) | ORAL | 0 refills | Status: AC

## 2022-02-04 NOTE — Telephone Encounter (Signed)
The [facility] on [address] is requesting a mediation alternative to be called in for the patient. The pharmacy is out of oxyCODONE-acetaminophen (PERCOCET/ROXICET) 5-325 MG tablet [[id number]]  ?Please advise  ?

## 2022-02-04 NOTE — Progress Notes (Signed)
Change script due to pharmacy shortage per [facility] ? ?Kennieth Rad, PA-C ?Physician Assistant ?[place] ?[email or web address] ? ?

## 2022-02-08 NOTE — Telephone Encounter (Signed)
I have not seen this patient in 5 years.  Message has been forwarded to prescriber. ?

## 2022-02-08 NOTE — Telephone Encounter (Signed)
Patient is aware of alternate medication being sent in.

## 2022-02-08 NOTE — Telephone Encounter (Addendum)
Pt called in to follow up on request for an alternative medication. Pt says that he has been expecting a call back for further assistance.  ? ? ?Please assist pt further.  ?

## 2022-02-22 ENCOUNTER — Encounter (HOSPITAL_BASED_OUTPATIENT_CLINIC_OR_DEPARTMENT_OTHER): Payer: Self-pay

## 2022-02-25 ENCOUNTER — Ambulatory Visit: Payer: Medicaid Other | Admitting: Infectious Diseases

## 2022-03-08 ENCOUNTER — Other Ambulatory Visit: Payer: Self-pay | Admitting: Surgery

## 2022-03-08 DIAGNOSIS — M533 Sacrococcygeal disorders, not elsewhere classified: Secondary | ICD-10-CM

## 2022-03-08 DIAGNOSIS — A630 Anogenital (venereal) warts: Secondary | ICD-10-CM

## 2022-03-08 DIAGNOSIS — Z872 Personal history of diseases of the skin and subcutaneous tissue: Secondary | ICD-10-CM

## 2022-03-28 ENCOUNTER — Encounter: Payer: Self-pay | Admitting: Infectious Diseases

## 2022-04-01 ENCOUNTER — Inpatient Hospital Stay: Admission: RE | Admit: 2022-04-01 | Payer: Medicaid Other | Source: Ambulatory Visit

## 2022-04-08 ENCOUNTER — Other Ambulatory Visit: Payer: Medicaid Other

## 2022-05-04 ENCOUNTER — Ambulatory Visit
Admission: RE | Admit: 2022-05-04 | Discharge: 2022-05-04 | Disposition: A | Discharge status: Home / Self Care | Payer: Medicaid Other | Source: Ambulatory Visit | Attending: Surgery | Admitting: Surgery

## 2022-05-04 DIAGNOSIS — M533 Sacrococcygeal disorders, not elsewhere classified: Secondary | ICD-10-CM

## 2022-05-04 DIAGNOSIS — M5137 Other intervertebral disc degeneration, lumbosacral region: Secondary | ICD-10-CM | POA: Diagnosis not present

## 2022-05-04 DIAGNOSIS — Z872 Personal history of diseases of the skin and subcutaneous tissue: Secondary | ICD-10-CM

## 2022-05-04 DIAGNOSIS — R102 Pelvic and perineal pain: Secondary | ICD-10-CM | POA: Diagnosis not present

## 2022-05-04 DIAGNOSIS — A630 Anogenital (venereal) warts: Secondary | ICD-10-CM

## 2022-05-04 DIAGNOSIS — M5136 Other intervertebral disc degeneration, lumbar region: Secondary | ICD-10-CM | POA: Diagnosis not present

## 2022-05-04 MED ORDER — IOPAMIDOL (ISOVUE-300) INJECTION 61%
100.0000 mL | Freq: Once | INTRAVENOUS | Status: AC | PRN
  Administered 2022-05-04: 100 mL via INTRAVENOUS

## 2022-05-20 ENCOUNTER — Encounter: Payer: Self-pay | Admitting: Gastroenterology

## 2022-06-01 ENCOUNTER — Other Ambulatory Visit: Payer: Self-pay | Admitting: Infectious Diseases

## 2022-06-01 DIAGNOSIS — B20 Human immunodeficiency virus [HIV] disease: Secondary | ICD-10-CM

## 2022-06-03 DIAGNOSIS — G4733 Obstructive sleep apnea (adult) (pediatric): Secondary | ICD-10-CM | POA: Diagnosis not present

## 2022-06-03 DIAGNOSIS — G4710 Hypersomnia, unspecified: Secondary | ICD-10-CM | POA: Diagnosis not present

## 2022-06-03 DIAGNOSIS — I10 Essential (primary) hypertension: Secondary | ICD-10-CM | POA: Diagnosis not present

## 2022-10-15 ENCOUNTER — Other Ambulatory Visit: Payer: Self-pay | Admitting: Physician Assistant

## 2022-10-15 DIAGNOSIS — M25562 Pain in left knee: Secondary | ICD-10-CM

## 2022-10-15 NOTE — Telephone Encounter (Signed)
Requested medication (s) are due for refill today:   Yes  Requested medication (s) are on the active medication list:   Yes  Future visit scheduled:   No   Last ordered: 10/21/2021 #30, 0 refills  Returned because [employer] last prescribed this on the mobile unit.   Pt. Of Dr. Smitty Pluck.     Requested Prescriptions  Pending Prescriptions Disp Refills   meloxicam (MOBIC) 7.5 MG tablet [Pharmacy Med Name: MELOXICAM 7.5 MG TABLET] 30 tablet 0    Sig: TAKE 1 TABLET BY MOUTH EVERY DAY     There is no refill protocol information for this order

## 2022-10-19 NOTE — Telephone Encounter (Signed)
Routing to PCP for review.

## 2022-12-03 DIAGNOSIS — I10 Essential (primary) hypertension: Secondary | ICD-10-CM | POA: Diagnosis not present

## 2022-12-03 DIAGNOSIS — G4710 Hypersomnia, unspecified: Secondary | ICD-10-CM | POA: Diagnosis not present

## 2022-12-03 DIAGNOSIS — G4733 Obstructive sleep apnea (adult) (pediatric): Secondary | ICD-10-CM | POA: Diagnosis not present

## 2022-12-09 ENCOUNTER — Other Ambulatory Visit (HOSPITAL_BASED_OUTPATIENT_CLINIC_OR_DEPARTMENT_OTHER): Payer: Self-pay | Admitting: Family

## 2022-12-09 DIAGNOSIS — I25118 Atherosclerotic heart disease of native coronary artery with other forms of angina pectoris: Secondary | ICD-10-CM

## 2022-12-09 DIAGNOSIS — I10 Essential (primary) hypertension: Secondary | ICD-10-CM

## 2022-12-09 NOTE — Telephone Encounter (Signed)
Pt of Dr. Debara Pickett. Last seen 11/2021. Please review for refill/schedule appointment for overdue annual follow-up. Thank you!

## 2023-01-31 ENCOUNTER — Other Ambulatory Visit (HOSPITAL_BASED_OUTPATIENT_CLINIC_OR_DEPARTMENT_OTHER): Payer: Self-pay | Admitting: Family

## 2023-01-31 ENCOUNTER — Telehealth: Payer: Self-pay | Admitting: Family

## 2023-01-31 DIAGNOSIS — I10 Essential (primary) hypertension: Secondary | ICD-10-CM

## 2023-01-31 DIAGNOSIS — E785 Hyperlipidemia, unspecified: Secondary | ICD-10-CM

## 2023-01-31 NOTE — Telephone Encounter (Signed)
*  STAT* If patient is at the pharmacy, call can be transferred to refill team.   1. Which medications need to be refilled? (please list name of each medication and dose if known)   ezetimibe (ZETIA) 10 MG tablet    2. Which pharmacy/location (including street and city if local pharmacy) is medication to be sent to?   [id number] - [place], [place] - [address]    3. Do they need a 30 day or 90 day supply? 90  Pt scheduled for 5/29 with Dan Humphreys, NP

## 2023-01-31 NOTE — Telephone Encounter (Signed)
Patient of Dr. Hilty. Please review for refill. Thank you!  

## 2023-02-03 NOTE — Telephone Encounter (Signed)
Medication has been refilled by another CMA in the office 02/01/2023

## 2023-02-15 ENCOUNTER — Other Ambulatory Visit (HOSPITAL_BASED_OUTPATIENT_CLINIC_OR_DEPARTMENT_OTHER): Payer: Self-pay | Admitting: Internal Medicine

## 2023-02-15 DIAGNOSIS — I10 Essential (primary) hypertension: Secondary | ICD-10-CM

## 2023-02-15 DIAGNOSIS — E785 Hyperlipidemia, unspecified: Secondary | ICD-10-CM

## 2023-03-23 ENCOUNTER — Encounter (HOSPITAL_BASED_OUTPATIENT_CLINIC_OR_DEPARTMENT_OTHER): Payer: Self-pay | Admitting: Family

## 2023-03-23 ENCOUNTER — Ambulatory Visit (HOSPITAL_BASED_OUTPATIENT_CLINIC_OR_DEPARTMENT_OTHER): Payer: Medicaid Other | Admitting: Family

## 2023-03-23 VITALS — BP 142/88 | HR 57 | Ht 73.0 in | Wt 215.0 lb

## 2023-03-23 DIAGNOSIS — I10 Essential (primary) hypertension: Secondary | ICD-10-CM | POA: Diagnosis not present

## 2023-03-23 DIAGNOSIS — E785 Hyperlipidemia, unspecified: Secondary | ICD-10-CM | POA: Diagnosis not present

## 2023-03-23 DIAGNOSIS — I25118 Atherosclerotic heart disease of native coronary artery with other forms of angina pectoris: Secondary | ICD-10-CM

## 2023-03-23 DIAGNOSIS — G4733 Obstructive sleep apnea (adult) (pediatric): Secondary | ICD-10-CM | POA: Diagnosis not present

## 2023-03-23 MED ORDER — BLOOD PRESSURE KIT
1.0000 | PACK | Freq: Every day | 0 refills | Status: DC
Start: 2023-03-23 — End: 2023-03-23

## 2023-03-23 MED ORDER — BLOOD PRESSURE KIT
1.0000 | PACK | Freq: Every day | 0 refills | Status: AC
Start: 2023-03-23 — End: ?

## 2023-03-23 MED ORDER — EZETIMIBE 10 MG PO TABS
10.0000 mg | ORAL_TABLET | Freq: Every day | ORAL | 2 refills | Status: DC
Start: 2023-03-23 — End: 2023-03-25

## 2023-03-23 MED ORDER — ROSUVASTATIN CALCIUM 40 MG PO TABS
40.0000 mg | ORAL_TABLET | Freq: Every day | ORAL | 3 refills | Status: DC
Start: 2023-03-23 — End: 2024-04-03

## 2023-03-23 MED ORDER — AMLODIPINE BESYLATE 5 MG PO TABS: 5.0000 mg | ORAL_TABLET | Freq: Every day | ORAL | 3 refills | Status: DC

## 2023-03-23 NOTE — Patient Instructions (Signed)
Medication Instructions:  Your physician has recommended you make the following change in your medication:   Increase: Amlodipine 5mg  daily   We have refilled Crestor and Zetia today  *If you need a refill on your cardiac medications before your next appointment, please call your pharmacy*   Lab Work: Your physician recommends that you return for lab work today- CMP, CBC,Lipid Panel and Direct LDL   If you have labs (blood work) drawn today and your tests are completely normal, you will receive your results only by: MyChart Message (if you have MyChart) OR A paper copy in the mail If you have any lab test that is abnormal or we need to change your treatment, we will call you to review the results.  Follow-Up: At [facility], you and your health needs are our priority.  As part of our continuing mission to provide you with exceptional heart care, we have created designated Provider Care Teams.  These Care Teams include your primary Cardiologist (physician) and Advanced Practice Providers (APPs -  Physician Assistants and Nurse Practitioners) who all work together to provide you with the care you need, when you need it.  We recommend signing up for the patient portal called "MyChart".  Sign up information is provided on this After Visit Summary.  MyChart is used to connect with patients for Virtual Visits (Telemedicine).  Patients are able to view lab/test results, encounter notes, upcoming appointments, etc.  Non-urgent messages can be sent to your provider as well.   To learn more about what you can do with MyChart, go to [email or web address].    Your next appointment:   4-6 months with Dr. Rennis Golden or Gillian Shields, NP

## 2023-03-23 NOTE — Progress Notes (Signed)
Office Visit    Patient Name: Benjamin Herrera Date of Encounter: 03/23/2023  PCP:  Hoy Register, MD   [facility]  Cardiologist:  Chrystie Nose, MD  Advanced Practice Provider:  No care team member to display Electrophysiologist:  None      Chief Complaint    Benjamin Herrera is a 57 y.o. male with a hx of CAD s/p stent to OM1 in 2017, polysubstance abuse, hypertension, anemia, GERD, HIV presents today for follow up of CAD  Past Medical History    Past Medical History:  Diagnosis Date   Achalasia and cardiospasm    Allergy    Arthritis    back ,shoulders    Asthma    Boils    under arms hx of   CAD (coronary artery disease)    a. 09/2016: cath showing 100% RCA occlusion with collaterals and 90% OM1 (treated with DES)   Candidiasis of mouth    Candidiasis of unspecified site    Cholelithiasis    Cocaine abuse (HCC)    Quit 09/2016   Condyloma acuminatum in male of scrotum & anal canal s/p laser ablation 07/03/2012   Coronary artery disease    Dysphagia    Fracture, humerus    GERD (gastroesophageal reflux disease)    Heart attack (HCC)    Hemorrhoids    Hepatitis 1993   A   Herpes labialis    HIV (human immunodeficiency virus infection) (HCC) DX 1993   Hypertension Dx 2014   off all bp meds for last 9 or 10 months   Pilonidal disease 01/10/2012   Polysubstance abuse (HCC)    Psoriasis    hx of   Sleep apnea    does  have CPAP   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/03/2012   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 08/03/2012   Past Surgical History:  Procedure Laterality Date   BALLOON DILATION N/A 11/11/2015   Procedure: BALLOON DILATION;  Surgeon: [name], MD;  Location: [facility];  Service: Endoscopy;  Laterality: N/A;   BOTOX INJECTION  10/11/2012   Procedure: BOTOX INJECTION;  Surgeon: [name], MD;  [facility]: [facility];  Service: Endoscopy;   Laterality: N/A;   BOTOX INJECTION N/A 01/25/2013   Procedure: BOTOX INJECTION;  Surgeon: [name], MD;  [facility]: [facility];  Service: Endoscopy;  Laterality: N/A;   BOTOX INJECTION N/A 06/21/2013   Procedure: BOTOX INJECTION;  Surgeon: [name], MD;  [facility];  Service: Endoscopy;  Laterality: N/A;   BOTOX INJECTION N/A 10/08/2013   Procedure: BOTOX INJECTION;  Surgeon: [name], MD;  [facility];  Service: Endoscopy;  Laterality: N/A;   BOTOX INJECTION N/A 06/16/2015   Procedure: BOTOX INJECTION;  Surgeon: [name], MD;  [facility];  Service: Endoscopy;  Laterality: N/A;   CARDIAC CATHETERIZATION N/A 10/11/2016   Procedure: Left Heart Cath and Coronary Angiography;  Surgeon: Kathleene Hazel, MD;  mRCA 100% w/ L>R collaterals, OM1 90%   CARDIAC CATHETERIZATION N/A 10/11/2016   Procedure: Coronary Stent Intervention;  Surgeon: Kathleene Hazel, MD;  RESOLUTE ONYX 3.5X15 DES OM1   COLONOSCOPY  05/19/2012   Procedure: COLONOSCOPY;  Surgeon: [name], MD;  [facility];  Service: Endoscopy;  Laterality: N/A;  jill trying to contact pt to come in 0830 for 930 case, phone not accepting messages  COLONOSCOPY     ESOPHAGEAL MANOMETRY  05/29/2012   Procedure: ESOPHAGEAL MANOMETRY (EM);  Surgeon: [name], MD;  [facility];  Service: Endoscopy;  Laterality: N/A;   ESOPHAGEAL MANOMETRY N/A 10/06/2015   Procedure: ESOPHAGEAL MANOMETRY (EM);  Surgeon: [name], MD;  [facility];  Service: Endoscopy;  Laterality: N/A;   ESOPHAGOGASTRODUODENOSCOPY  11/11/2011   Procedure: ESOPHAGOGASTRODUODENOSCOPY (EGD);  Surgeon: [name], MD;  Location: [facility];  Service: Endoscopy;  Laterality: N/A;  botox injection  called Pt to change time of procedure per Dr Arlyce Dice   ESOPHAGOGASTRODUODENOSCOPY  03/10/2012   Procedure: ESOPHAGOGASTRODUODENOSCOPY (EGD);  Surgeon: [name], MD;  Location: [facility];  Service: Endoscopy;  Laterality: N/A;   ESOPHAGOGASTRODUODENOSCOPY  10/11/2012   Procedure: ESOPHAGOGASTRODUODENOSCOPY (EGD);  Surgeon: [name], MD;  Location: [facility];  Service: Endoscopy;  Laterality: N/A;   ESOPHAGOGASTRODUODENOSCOPY N/A 01/25/2013   Procedure: ESOPHAGOGASTRODUODENOSCOPY (EGD);  Surgeon: [name], MD;  Location: [facility];  Service: Endoscopy;  Laterality: N/A;   ESOPHAGOGASTRODUODENOSCOPY N/A 06/21/2013   Procedure: ESOPHAGOGASTRODUODENOSCOPY (EGD);  Surgeon: [name], MD;  Location: [facility];  Service: Endoscopy;  Laterality: N/A;   ESOPHAGOGASTRODUODENOSCOPY (EGD) WITH PROPOFOL N/A 10/08/2013   Procedure: ESOPHAGOGASTRODUODENOSCOPY (EGD) WITH PROPOFOL;  Surgeon: [name], MD;  [facility];  Service: Endoscopy;  Laterality: N/A;   ESOPHAGOGASTRODUODENOSCOPY (EGD) WITH PROPOFOL N/A 06/16/2015   Procedure: ESOPHAGOGASTRODUODENOSCOPY (EGD) WITH PROPOFOL;  Surgeon: [name], MD;  [facility];  Service: Endoscopy;  Laterality: N/A;   ESOPHAGOGASTRODUODENOSCOPY (EGD) WITH PROPOFOL N/A 11/11/2015   Procedure: ESOPHAGOGASTRODUODENOSCOPY (EGD) WITH PROPOFOL;  Surgeon: [name], MD;  [facility];  Service: Endoscopy;  Laterality: N/A;   EXAMINATION UNDER ANESTHESIA  07/24/2012   Procedure: EXAM UNDER ANESTHESIA;  Surgeon: [name], MD;  [facility]: [facility];  Service: General;  Laterality: N/A;   humeral fracture surgery Right yrs ago   PILONIDAL CYST EXCISION  07/24/2012   Procedure: CYST EXCISION PILONIDAL SIMPLE;  Surgeon: [name], MD;  [facility]: [facility];  Service: General;  Laterality: N/A;  Exam Under Anesthesia,, Excision Pilonidal Disease,    right shoulder replacement  2008   x 2   UPPER GASTROINTESTINAL ENDOSCOPY     WART FULGURATION  07/24/2012   Procedure: FULGURATION ANAL WART;  Surgeon: [name], MD;  [facility]: [facility];  Service:  General;  Laterality: N/A;  excision of raphe mass    Allergies  Allergies  Allergen Reactions   Citrus Swelling    Mouth itching and swelling     History of Present Illness    Benjamin Herrera is a 57 y.o. male with a hx of CAD s/p stent to OM1 in 2017, polysubstance abuse, hypertension, anemia, GERD, HIV  last seen 12/09/21  Prior cardiac cath 09/2016 due to chest pain showing severe two-vessel coronary disease in the mid to distal RCA occlusion and left-to-right collaterals as well as 90% OM1 lesion which was treated with DES.  He was seen in the ED 10/29/2021 due to drug overdose.  He did not have intention to harm to himself wonders if his cocaine was laced with fentanyl.  Bystanders found her unresponsive and EMS called.  Myoview due to chest pain 11/2021 low risk. At follow up 12/09/21 Zetia added.  He presents today for follow-up. Enjoys watching movies in his spare time. Still smoking 3-4 cigarettes/day but denies cocaine use. He eats at home and makes his  own food. He uses his air fryer. No formal exercise routine. Reports occasional left sided heart flutter  but no chest pain. Notes working with GI regarding achalasia and likely will need balloon dilation of esophagus per his report.   EKGs/Labs/Other Studies Reviewed:   The following studies were reviewed today:  Cardiac Studies & Procedures   CARDIAC CATHETERIZATION  CARDIAC CATHETERIZATION 10/11/2016  Narrative  Prox RCA lesion, 20 %stenosed.  Prox RCA to Mid RCA lesion, 99 %stenosed.  Mid RCA to Dist RCA lesion, 100 %stenosed.  1st Mrg-2 lesion, 60 %stenosed.  1st Mrg-1 lesion, 90 %stenosed.  Post intervention, there is a 0% residual stenosis.  A STENT RESOLUTE ONYX 3.5X15 drug eluting stent was successfully placed.  Ost LAD to Mid LAD lesion, 20 %stenosed.  1. Severe double vessel CAD 2. Unstable angina 3. Severe stenosis first obtuse marginal branch 4. Successful PTCA/DES x 1 OM1 5. Chronic total  occlusion mid RCA. The distal vessel fills from left to right collaterals.  Recommendations: Will plan one year of DAPT with ASA and Plavix. Would start a statin. Likely discharge tomorrow. Follow up with Dr. Rennis Golden.  Findings Coronary Findings Diagnostic  Dominance: Right  Left Anterior Descending Vessel is large.  First Diagonal Branch Vessel is moderate in size.  Second Diagonal Branch Vessel is small in size.  Third Diagonal Branch Vessel is small in size.  Ramus Intermedius Vessel is small.  Left Circumflex Vessel is large.  First Obtuse Marginal Branch Vessel is large in size.  Third Obtuse Marginal Branch Vessel is large in size.  Right Coronary Artery  The lesion is chronically occluded.  Inferior Septal Collaterals Inf Sept filled by collaterals from 3rd Mrg.  Intervention  1st Mrg-1 lesion Angioplasty Lesion crossed with guidewire. Pre-stent angioplasty was performed using a BALLOON EMERGE MR 2.5X12. A STENT RESOLUTE ONYX 3.5X15 drug eluting stent was successfully placed. Stent strut is well apposed. Post-stent angioplasty was performed using a BALLOON [place] EMERGE MR [id number]. The pre-interventional distal flow is normal (TIMI 3).  The post-interventional distal flow is normal (TIMI 3). The intervention was successful . No complications occurred at this lesion. There is a 0% residual stenosis post intervention.   STRESS TESTS  MYOCARDIAL PERFUSION IMAGING 12/07/2021  Narrative   The study is normal. The study is low risk.   No ST deviation was noted.   LV perfusion is normal. There is no evidence of ischemia. There is no evidence of infarction.   Left ventricular function is normal. Nuclear stress EF: 56 %. The left ventricular ejection fraction is normal (55-65%). End diastolic cavity size is normal. End systolic cavity size is normal.   Prior study available for comparison from 10/09/2016.   ECHOCARDIOGRAM  ECHOCARDIOGRAM COMPLETE  10/09/2016  Narrative *[place]* *[facility]* [address] [place], [place] [address] [phone]  ------------------------------------------------------------------- Transthoracic Echocardiography  Patient:    Dehaven, Merrell MR #:       [id number] Study Date: 10/09/2016 Gender:     M Age:        50 Height:     185.4 cm Weight:     105.6 kg BSA:        2.36 m^2 Pt. Status: Room:       3W23C  ORDERING     Marcos Eke REFERRING    Marlowe Kays E SONOGRAPHER  Arvil Chaco PERFORMING   [facility], Inpatient ATTENDING    Little, Ambrose [place] ADMITTING    Hugelmeyer, Alexis  cc:  -------------------------------------------------------------------  LV EF: 60% -   65%  ------------------------------------------------------------------- Indications:      Chest pain 786.51.  ------------------------------------------------------------------- History:   PMH:  Hypertension. Polysubstance abuse. Smoker. HIV. Hyperlipidemia.  ------------------------------------------------------------------- Study Conclusions  - Left ventricle: The cavity size was normal. Systolic function was normal. The estimated ejection fraction was in the range of 60% to 65%. Wall motion was normal; there were no regional wall motion abnormalities. Left ventricular diastolic function parameters were normal. - Atrial septum: No defect or patent foramen ovale was identified.  ------------------------------------------------------------------- Study data:  No prior study was available for comparison.  Study status:  Routine.  Procedure:  The patient reported no pain pre or post test. Transthoracic echocardiography. Image quality was adequate.  Study completion:  There were no complications. Transthoracic echocardiography.  M-mode, complete 2D, spectral Doppler, and color Doppler.  Birthdate:  Patient birthdate: 07/03/1966.  Age:  Patient is 57 yr old.  Sex:  Gender: male. BMI:  30.7 kg/m^2.  Blood pressure:     170/72  Patient status: Inpatient.  Study date:  Study date: 10/09/2016. Study time: 11:57 AM.  Location:  Bedside.  -------------------------------------------------------------------  ------------------------------------------------------------------- Left ventricle:  The cavity size was normal. Systolic function was normal. The estimated ejection fraction was in the range of 60% to 65%. Wall motion was normal; there were no regional wall motion abnormalities. The transmitral flow pattern was normal. The deceleration time of the early transmitral flow velocity was normal. The pulmonary vein flow pattern was normal. The tissue Doppler parameters were normal. Left ventricular diastolic function parameters were normal.  ------------------------------------------------------------------- Aortic valve:   Trileaflet; normal thickness leaflets. Mobility was not restricted.  Doppler:  Transvalvular velocity was within the normal range. There was no stenosis. There was no regurgitation.  ------------------------------------------------------------------- Aorta:  The aorta was normal, not dilated, and non-diseased. Aortic root: The aortic root was normal in size.  ------------------------------------------------------------------- Mitral valve:   Structurally normal valve.   Mobility was not restricted.  Doppler:  Transvalvular velocity was within the normal range. There was no evidence for stenosis. There was no regurgitation.  ------------------------------------------------------------------- Left atrium:  The atrium was normal in size.  ------------------------------------------------------------------- Atrial septum:  No defect or patent foramen ovale was identified.  ------------------------------------------------------------------- Right ventricle:  The cavity size was normal. Wall thickness was normal. Systolic function was  normal.  ------------------------------------------------------------------- Pulmonic valve:    Doppler:  Transvalvular velocity was within the normal range. There was no evidence for stenosis. There was trivial regurgitation.  ------------------------------------------------------------------- Tricuspid valve:   Structurally normal valve.    Doppler: Transvalvular velocity was within the normal range. There was mild regurgitation.  ------------------------------------------------------------------- Pulmonary artery:   The main pulmonary artery was normal-sized. Systolic pressure was within the normal range.  ------------------------------------------------------------------- Right atrium:  The atrium was normal in size.  ------------------------------------------------------------------- Pericardium:  The pericardium was normal in appearance. There was no pericardial effusion.  ------------------------------------------------------------------- Systemic veins: Inferior vena cava: The vessel was normal in size. The respirophasic diameter changes were in the normal range (>= 50%), consistent with normal central venous pressure.  ------------------------------------------------------------------- Post procedure conclusions Ascending Aorta:  - The aorta was normal, not dilated, and non-diseased.  ------------------------------------------------------------------- Measurements  Left ventricle                         Value        Reference LV ID, ED, PLAX chordal        (H)     52.4  mm     43 - 52 LV ID, ES, PLAX chordal                35.1  mm     23 - 38 LV fx shortening, PLAX chordal         33    %      >=29 LV PW thickness, ED                    12.1  mm     ---------- IVS/LV PW ratio, ED                    0.98         <=1.3 Stroke volume, 2D                      79    ml     ---------- Stroke volume/bsa, 2D                  34    ml/m^2 ---------- LV e&', lateral                          9.03  cm/s   ---------- LV E/e&', lateral                       7.49         ---------- LV e&', medial                          9.57  cm/s   ---------- LV E/e&', medial                        7.06         ---------- LV e&', average                         9.3   cm/s   ---------- LV E/e&', average                       7.27         ----------  Ventricular septum                     Value        Reference IVS thickness, ED                      11.8  mm     ----------  LVOT                                   Value        Reference LVOT ID, S                             21    mm     ---------- LVOT area                              3.46  cm^2   ---------- LVOT peak velocity, S  116   cm/s   ---------- LVOT mean velocity, S                  74.6  cm/s   ---------- LVOT VTI, S                            22.7  cm     ---------- LVOT peak gradient, S                  5     mm Hg  ----------  Aorta                                  Value        Reference Aortic root ID, ED                     35    mm     ----------  Left atrium                            Value        Reference LA ID, A-P, ES                         38    mm     ---------- LA ID/bsa, A-P                         1.61  cm/m^2 <=2.2 LA volume, S                           76.5  ml     ---------- LA volume/bsa, S                       32.5  ml/m^2 ---------- LA volume, ES, 1-p A4C                 69.4  ml     ---------- LA volume/bsa, ES, 1-p A4C             29.4  ml/m^2 ---------- LA volume, ES, 1-p A2C                 84.8  ml     ---------- LA volume/bsa, ES, 1-p A2C             36    ml/m^2 ----------  Mitral valve                           Value        Reference Mitral E-wave peak velocity            67.6  cm/s   ---------- Mitral A-wave peak velocity            53.6  cm/s   ---------- Mitral deceleration time       (H)     235   ms     150 - 230 Mitral E/A ratio, peak                 1.3           ----------  Right atrium  Value        Reference RA ID, S-I, ES, A4C            (H)     50.4  mm     34 - 49 RA area, ES, A4C               (H)     19.6  cm^2   8.3 - 19.5 RA volume, ES, A/L                     61    ml     ---------- RA volume/bsa, ES, A/L                 25.9  ml/m^2 ----------  Systemic veins                         Value        Reference Estimated CVP                          3     mm Hg  ----------  Right ventricle                        Value        Reference TAPSE                                  29.6  mm     ---------- RV s&', lateral, S                      13.9  cm/s   ----------  Legend: (L)  and  (H)  mark values outside specified reference range.  ------------------------------------------------------------------- Prepared and Electronically Authenticated by  Charlton Haws, M.D. 2017-12-16T12:49:34              EKG:  No EKG today.  Recent Labs: No results found for requested labs within last 365 days.  Recent Lipid Panel    Component Value Date/Time   CHOL 158 11/10/2021 0816   TRIG 77 11/10/2021 0816   HDL 36 (L) 11/10/2021 0816   CHOLHDL 4.4 11/10/2021 0816   CHOLHDL 6.2 (H) 07/30/2020 1437   VLDL 21 01/26/2017 1526   LDLCALC 107 (H) 11/10/2021 0816   LDLCALC 124 (H) 07/30/2020 1437    Home Medications   Current Meds  Medication Sig   albuterol (VENTOLIN HFA) 108 (90 Base) MCG/ACT inhaler Inhale 2 puffs into the lungs every 6 (six) hours as needed for wheezing or shortness of breath.   amLODipine (NORVASC) 2.5 MG tablet TAKE 1 TABLET BY MOUTH EVERY DAY   aspirin 81 MG chewable tablet Chew 1 tablet (81 mg total) by mouth daily.   BIKTARVY 30-120-15 MG TABS TAKE 1 TABLET BY MOUTH EVERY DAY   ezetimibe (ZETIA) 10 MG tablet TAKE 1 TABLET BY MOUTH EVERY DAY   hydrocortisone 2.5 % ointment Apply topically 2 (two) times daily. Apply around rectal area. (Patient taking differently: Apply topically as needed.  Apply around rectal area.)   meloxicam (MOBIC) 7.5 MG tablet Take 1 tablet (7.5 mg total) by mouth daily.   metoprolol succinate (TOPROL-XL) 25 MG 24 hr tablet TAKE 1/2 TABLET BY MOUTH EVERY DAY   Naphazoline-Pheniramine (OPCON-A OP) Apply 1-2 drops to eye 2 (two) times daily  as needed (dry eyes.).    nitroGLYCERIN (NITROSTAT) 0.4 MG SL tablet Place 1 tablet (0.4 mg total) under the tongue every 5 (five) minutes as needed for chest pain.   rosuvastatin (CRESTOR) 40 MG tablet Take 1 tablet (40 mg total) by mouth daily.     Review of Systems      All other systems reviewed and are otherwise negative except as noted above.  Physical Exam    VS:  BP 138/72   Pulse (!) 57   Ht 6\' 1"  (1.854 m)   Wt 215 lb (97.5 kg)   BMI 28.37 kg/m  , BMI Body mass index is 28.37 kg/m.  Wt Readings from Last 3 Encounters:  03/23/23 215 lb (97.5 kg)  02/03/22 228 lb (103.4 kg)  01/21/22 225 lb (102.1 kg)     GEN: Well nourished, overweight, well developed, in no acute distress. HEENT: normal. Neck: Supple, no JVD, carotid bruits, or masses. Cardiac: RRR, no murmurs, rubs, or gallops. No clubbing, cyanosis, edema.  Radials/PT 2+ and equal bilaterally.  Respiratory:  Respirations regular and unlabored, clear to auscultation bilaterally. GI: Soft, nontender, nondistended. MS: No deformity or atrophy. Skin: Warm and dry, no rash. Neuro:  Strength and sensation are intact. Psych: Normal affect.  Assessment & Plan    CAD - 2017 DES to OM1. Myoview 11/2021 low risk study. Stable with no anginal symptoms. No indication for ischemic evaluation.  GDMT includes aspirin, rosuvastatin, metoprolol, zetia.Heart healthy diet and regular cardiovascular exercise encouraged.  Direct LDL, CMP, CBC, lipid panel today.   Hypertension - BP well controlled. Continue current antihypertensive regimen.  Refill provided. Rx BP cuff to [facility] to be delivered to his home.   Hyperlipidemia - Continue Crestor 40mg  QD,   Zetia 10mg  QD. Repeat CMP, lipid panel, direct LDL today.  Prefers to avoid injections. If LDL not at goal consider NExlizet pending insurance coverage.   Polysubstance use -complete cessation advised. Still smoking cigarettes but denies cocaine use.   OSA on CPAP - CPAP compliance encouraged. Endorses using regularly.   Obesity - Weight loss via diet and exercise encouraged. Discussed the impact being overweight would have on cardiovascular risk.   Disposition: Follow up  4-6 months  with Chrystie Nose, MD or APP.  Signed, Alver Sorrow, NP 03/23/2023, 3:40 PM [facility]

## 2023-03-24 LAB — COMPREHENSIVE METABOLIC PANEL
ALT: 8 IU/L (ref 0–44)
AST: 14 IU/L (ref 0–40)
Albumin/Globulin Ratio: 1.4 (ref 1.2–2.2)
Albumin: 4.2 g/dL (ref 3.8–4.9)
Alkaline Phosphatase: 84 IU/L (ref 44–121)
BUN/Creatinine Ratio: 8 — ABNORMAL LOW (ref 9–20)
BUN: 9 mg/dL (ref 6–24)
Bilirubin Total: 0.4 mg/dL (ref 0.0–1.2)
CO2: 30 mmol/L — ABNORMAL HIGH (ref 20–29)
Calcium: 9.4 mg/dL (ref 8.7–10.2)
Chloride: 101 mmol/L (ref 96–106)
Creatinine, Ser: 1.13 mg/dL (ref 0.76–1.27)
Globulin, Total: 2.9 g/dL (ref 1.5–4.5)
Glucose: 82 mg/dL (ref 70–99)
Potassium: 3 mmol/L — ABNORMAL LOW (ref 3.5–5.2)
Sodium: 143 mmol/L (ref 134–144)
Total Protein: 7.1 g/dL (ref 6.0–8.5)
eGFR: 76 mL/min/{1.73_m2} (ref >59)

## 2023-03-24 LAB — CBC
Hematocrit: 42.8 % (ref 37.5–51.0)
Hemoglobin: 13.9 g/dL (ref 13.0–17.7)
MCH: 25.4 pg — ABNORMAL LOW (ref 26.6–33.0)
MCHC: 32.5 g/dL (ref 31.5–35.7)
MCV: 78 fL — ABNORMAL LOW (ref 79–97)
Platelets: 194 10*3/uL (ref 150–450)
RBC: 5.47 x10E6/uL (ref 4.14–5.80)
RDW: 14.5 % (ref 11.6–15.4)
WBC: 4.5 10*3/uL (ref 3.4–10.8)

## 2023-03-24 LAB — LIPID PANEL
Chol/HDL Ratio: 4.1 ratio (ref 0.0–5.0)
Cholesterol, Total: 138 mg/dL (ref 100–199)
HDL: 34 mg/dL — ABNORMAL LOW (ref >39)
LDL Chol Calc (NIH): 87 mg/dL (ref 0–99)
Triglycerides: 87 mg/dL (ref 0–149)
VLDL Cholesterol Cal: 17 mg/dL (ref 5–40)

## 2023-03-24 LAB — LDL CHOLESTEROL, DIRECT: LDL Direct: 81 mg/dL (ref 0–99)

## 2023-03-25 ENCOUNTER — Telehealth (HOSPITAL_BASED_OUTPATIENT_CLINIC_OR_DEPARTMENT_OTHER): Payer: Self-pay

## 2023-03-25 DIAGNOSIS — I10 Essential (primary) hypertension: Secondary | ICD-10-CM

## 2023-03-25 MED ORDER — POTASSIUM CHLORIDE CRYS ER 20 MEQ PO TBCR: 40.0000 meq | EXTENDED_RELEASE_TABLET | Freq: Every day | ORAL | 0 refills | Status: DC

## 2023-03-25 MED ORDER — NEXLIZET 180-10 MG PO TABS: 1.0000 | ORAL_TABLET | Freq: Every day | ORAL | 3 refills | Status: DC

## 2023-03-25 NOTE — Telephone Encounter (Signed)
Returned call to [facility], they are able to deliver patient on Monday.    Reviewed labs while on the phone with patient,   "Cholesterol improved from previous. LDL not at goal <70. Stop Zetia and start Nexlizet. Will require prior authorization. Normal kidneys, liver. Potassium is low. Recommend Potassium daily x 3 days. (Can take two of the tablets) Repeat BMP in 1 week for monitoring. CBC with no evidence of anemia nor infection. "   Meds and labs ordered, labs mailed to patient. He understands to not pick up nexlizet until we tell him too.

## 2023-03-25 NOTE — Addendum Note (Signed)
Addended by: Marlene Lard on: 03/25/2023 01:46 PM   Modules accepted: Orders

## 2023-03-25 NOTE — Telephone Encounter (Addendum)
Called [facility] to verify if patient can be sent a blood pressure cuff, no answer left message to call back   ----- Message from Marlene Lard, RN sent at 03/24/2023  7:43 AM EDT ----- Call [facility] to check on BP cuff

## 2023-03-27 ENCOUNTER — Encounter (HOSPITAL_BASED_OUTPATIENT_CLINIC_OR_DEPARTMENT_OTHER): Payer: Self-pay | Admitting: Family

## 2023-04-04 ENCOUNTER — Telehealth: Payer: Self-pay

## 2023-04-04 ENCOUNTER — Other Ambulatory Visit (HOSPITAL_COMMUNITY): Payer: Self-pay

## 2023-04-04 NOTE — Telephone Encounter (Signed)
Pharmacy Patient Advocate Encounter  Prior Authorization for NEXLIZET has been approved.    Effective dates: 04/04/23 through 10/01/23  Haze Rushing, CPhT Pharmacy Patient Advocate Specialist Direct Number: [phone] Fax: [phone]

## 2023-04-04 NOTE — Telephone Encounter (Signed)
PA initiated, please see separate encounter for updates on determination. (I will route you back in once a decision has been made)  Elmus Mathes, CPhT Pharmacy Patient Advocate Specialist Direct Number: [phone] Fax: [phone]  

## 2023-04-04 NOTE — Telephone Encounter (Signed)
Called patient, patient advised to that medication approved by insurance and to coordinate pick up with pharmacy. Patient verbalizes understanding.

## 2023-04-04 NOTE — Telephone Encounter (Signed)
Pharmacy Patient Advocate Encounter   Received notification from [employer] that prior authorization for NEXLIZET is needed.    PA submitted on 04/04/23 Key [id number] Status is pending  Haze Rushing, CPhT Pharmacy Patient Advocate Specialist Direct Number: [phone] Fax: [phone]

## 2023-06-15 DIAGNOSIS — G4733 Obstructive sleep apnea (adult) (pediatric): Secondary | ICD-10-CM | POA: Diagnosis not present

## 2023-06-15 DIAGNOSIS — G4710 Hypersomnia, unspecified: Secondary | ICD-10-CM | POA: Diagnosis not present

## 2023-06-15 DIAGNOSIS — I10 Essential (primary) hypertension: Secondary | ICD-10-CM | POA: Diagnosis not present

## 2023-07-12 ENCOUNTER — Encounter: Payer: Self-pay | Admitting: Nurse Practitioner

## 2023-07-29 ENCOUNTER — Ambulatory Visit: Payer: Medicaid Other | Attending: Internal Medicine | Admitting: Internal Medicine

## 2023-08-01 ENCOUNTER — Encounter: Payer: Self-pay | Admitting: Internal Medicine

## 2023-09-12 ENCOUNTER — Other Ambulatory Visit: Payer: Self-pay | Admitting: Infectious Diseases

## 2023-09-12 DIAGNOSIS — B20 Human immunodeficiency virus [HIV] disease: Secondary | ICD-10-CM

## 2023-09-29 ENCOUNTER — Other Ambulatory Visit: Payer: Self-pay | Admitting: Infectious Diseases

## 2023-09-29 DIAGNOSIS — B20 Human immunodeficiency virus [HIV] disease: Secondary | ICD-10-CM

## 2023-09-29 NOTE — Telephone Encounter (Signed)
Received refill request from pharmacy for Biktarvy Pt has not been seen by [facility]/Dr Ninetta Lights 01/21/2022

## 2023-10-03 ENCOUNTER — Encounter: Payer: Self-pay | Admitting: Nurse Practitioner

## 2023-10-03 ENCOUNTER — Ambulatory Visit: Payer: Medicaid Other | Admitting: Nurse Practitioner

## 2023-10-03 VITALS — BP 128/70 | HR 76 | Ht 72.0 in | Wt 195.4 lb

## 2023-10-03 DIAGNOSIS — K224 Dyskinesia of esophagus: Secondary | ICD-10-CM

## 2023-10-03 DIAGNOSIS — R634 Abnormal weight loss: Secondary | ICD-10-CM | POA: Diagnosis not present

## 2023-10-03 DIAGNOSIS — R1310 Dysphagia, unspecified: Secondary | ICD-10-CM | POA: Diagnosis not present

## 2023-10-03 DIAGNOSIS — Z1211 Encounter for screening for malignant neoplasm of colon: Secondary | ICD-10-CM

## 2023-10-03 NOTE — Progress Notes (Signed)
Brief Narrative 57 y.o. yo male with known remotely to Dr. [name] Paganini (2018)  with a past medical history not limited to HIV, remote illicit drug use , esophageal strictures/esophageal dysmotility, CAD status post stent placement 2017, anal condyloma with high grade dysplasia  ASSESSMENT    Worsening of chronic dysphagia / involuntary weight loss of 20 pounds . Symptoms 2/2 to stricture, ? Achalasia.  Remote history of  distal esophageal stricture, EG junction outflow obstruction.  He is s/p remote multiple esophageal dilations and Botox injections of LES, last intervention in 2017.   Screening colonoscopy. Due for 10 year screening colonoscopy   PLAN   --Schedule for EGD with dilation. The risks and benefits of EGD with possible biopsies and dilation were discussed with the patient who agrees to proceed.  --If dysphagia better after EGD patient will call to get scheduled for 10 year screening colonoscopy.  Right now it is unlikely that he would tolerate a prep since he is struggling to keep liquids down.    HPI   Chief complaint : Swallowing problems and due for colonoscopy  Luverne is back with recurrent dysphagia.  His last esophageal dilation was back in 2017.  He got several months of relief before symptoms recurred.  He has lived with the symptoms now for the last several years.  Dysphagia has gotten worse but he cannot say for sure over what period of time things have worsened.  He has lost about 40 pounds in the last several months.  Nearly everything he eats gets stuck in his esophagus.  It is challenging for him to even tolerate liquids.  He gives a history of a yeast infection in his throat but does not feel like that is his current problem . No other GI complaints.  No bowel changes or blood in stool.    GI History / Studies   **May not be a complete list of studies  July 2017 EGD - Esophageal stenosis. Dilated. - Normal stomach. - Normal examined duodenum. - No specimens  collected  Jan 2017 EGD Squamocolumnar junction at 42cm, LA gade b erosive esophagitis. mild resistance in passsing the sope from esophagus into the stomach at the GE junction. gastric antral erosions noted, no biopsies were taken from the gastric mucosa to rule out H. pylori. mild duodenitis in the duodenal bulb, second part off the duodenum appeared normal. CRE balloon dilation performed from 18-19-20mm with minimal heme on the ballon   Dec 2016 Esophageal Manometry Incomplete relaxation of the EG junction suggestive of EG junction outflow obstruction  July 2013 colonoscopy -Normal   Labs      Latest Ref Rng & Units 03/23/2023    4:17 PM 10/29/2021    2:45 AM 07/30/2020    2:37 PM  CBC  WBC 3.4 - 10.8 x10E3/uL 4.5  7.5  5.7   Hemoglobin 13.0 - 17.7 g/dL 16.1  09.6  04.5   Hematocrit 37.5 - 51.0 % 42.8  47.3  43.5   Platelets 150 - 450 x10E3/uL 194  189  212     Lab Results  Component Value Date   LIPASE 34 05/06/2016      Latest Ref Rng & Units 03/23/2023    4:17 PM 11/10/2021    8:16 AM 10/29/2021    2:45 AM  CMP  Glucose 70 - 99 mg/dL 82   409   BUN 6 - 24 mg/dL 9   10   Creatinine 8.11 - 1.27 mg/dL 9.14  0.83   Sodium 134 - 144 mmol/L 143   138   Potassium 3.5 - 5.2 mmol/L 3.0   3.7   Chloride 96 - 106 mmol/L 101   108   CO2 20 - 29 mmol/L 30   24   Calcium 8.7 - 10.2 mg/dL 9.4   9.2   Total Protein 6.0 - 8.5 g/dL 7.1  7.0    Total Bilirubin 0.0 - 1.2 mg/dL 0.4  0.3    Alkaline Phos 44 - 121 IU/L 84  76    AST 0 - 40 IU/L 14  13    ALT 0 - 44 IU/L 8  13       Past Medical History:  Diagnosis Date   Achalasia and cardiospasm    Allergy    Arthritis    back ,shoulders    Asthma    Boils    under arms hx of   CAD (coronary artery disease)    a. 09/2016: cath showing 100% RCA occlusion with collaterals and 90% OM1 (treated with DES)   Candidiasis of mouth    Candidiasis of unspecified site    Cholelithiasis    Cocaine abuse (HCC)    Quit 09/2016    Condyloma acuminatum in male of scrotum & anal canal s/p laser ablation 07/03/2012   Coronary artery disease    Dysphagia    Fracture, humerus    GERD (gastroesophageal reflux disease)    Heart attack (HCC)    Hemorrhoids    Hepatitis 1993   A   Herpes labialis    HIV (human immunodeficiency virus infection) (HCC) DX 1993   Hypertension Dx 2014   off all bp meds for last 9 or 10 months   Pilonidal disease 01/10/2012   Polysubstance abuse (HCC)    Psoriasis    hx of   Sleep apnea    does  have CPAP   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/03/2012   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 08/03/2012   Past Surgical History:  Procedure Laterality Date   BALLOON DILATION N/A 11/11/2015   Procedure: BALLOON DILATION;  Surgeon: [name], MD;  Location: [facility];  Service: Endoscopy;  Laterality: N/A;   BOTOX INJECTION  10/11/2012   Procedure: BOTOX INJECTION;  Surgeon: [name], MD;  Location: [facility];  Service: Endoscopy;  Laterality: N/A;   BOTOX INJECTION N/A 01/25/2013   Procedure: BOTOX INJECTION;  Surgeon: [name], MD;  Location: [facility];  Service: Endoscopy;  Laterality: N/A;   BOTOX INJECTION N/A 06/21/2013   Procedure: BOTOX INJECTION;  Surgeon: [name], MD;  Location: [facility];  Service: Endoscopy;  Laterality: N/A;   BOTOX INJECTION N/A 10/08/2013   Procedure: BOTOX INJECTION;  Surgeon: [name], MD;  Location: [facility];  Service: Endoscopy;  Laterality: N/A;   BOTOX INJECTION N/A 06/16/2015   Procedure: BOTOX INJECTION;  Surgeon: [name], MD;  Location: [facility];  Service: Endoscopy;  Laterality: N/A;   CARDIAC CATHETERIZATION N/A 10/11/2016   Procedure: Left Heart Cath and Coronary Angiography;  Surgeon: Kathleene Hazel, MD;  mRCA 100% w/ L>R collaterals, OM1 90%   CARDIAC CATHETERIZATION N/A 10/11/2016   Procedure: Coronary Stent  Intervention;  Surgeon: Kathleene Hazel, MD;  RESOLUTE ONYX 3.5X15 DES OM1   COLONOSCOPY  05/19/2012   Procedure: COLONOSCOPY;  Surgeon: [name], MD;  Location: [facility];  Service:  Endoscopy;  Laterality: N/A;  jill trying to contact pt to come in 0830 for 930 case, phone not accepting messages   COLONOSCOPY     ESOPHAGEAL MANOMETRY  05/29/2012   Procedure: ESOPHAGEAL MANOMETRY (EM);  Surgeon: [name], MD;  [facility]: [facility];  Service: Endoscopy;  Laterality: N/A;   ESOPHAGEAL MANOMETRY N/A 10/06/2015   Procedure: ESOPHAGEAL MANOMETRY (EM);  Surgeon: [name], MD;  [facility]: [facility];  Service: Endoscopy;  Laterality: N/A;   ESOPHAGOGASTRODUODENOSCOPY  11/11/2011   Procedure: ESOPHAGOGASTRODUODENOSCOPY (EGD);  Surgeon: [name], MD;  Location: [facility];  Service: Endoscopy;  Laterality: N/A;  botox injection  called Pt to change time of procedure per Dr Arlyce Dice   ESOPHAGOGASTRODUODENOSCOPY  03/10/2012   Procedure: ESOPHAGOGASTRODUODENOSCOPY (EGD);  Surgeon: [name], MD;  Location: [facility];  Service: Endoscopy;  Laterality: N/A;   ESOPHAGOGASTRODUODENOSCOPY  10/11/2012   Procedure: ESOPHAGOGASTRODUODENOSCOPY (EGD);  Surgeon: [name], MD;  Location: [facility];  Service: Endoscopy;  Laterality: N/A;   ESOPHAGOGASTRODUODENOSCOPY N/A 01/25/2013   Procedure: ESOPHAGOGASTRODUODENOSCOPY (EGD);  Surgeon: [name], MD;  Location: [facility];  Service: Endoscopy;  Laterality: N/A;   ESOPHAGOGASTRODUODENOSCOPY N/A 06/21/2013   Procedure: ESOPHAGOGASTRODUODENOSCOPY (EGD);  Surgeon: [name], MD;  Location: [facility];  Service: Endoscopy;  Laterality: N/A;   ESOPHAGOGASTRODUODENOSCOPY (EGD) WITH PROPOFOL N/A 10/08/2013   Procedure: ESOPHAGOGASTRODUODENOSCOPY (EGD) WITH PROPOFOL;  Surgeon: [name], MD;  [facility]: [facility];  Service: Endoscopy;  Laterality: N/A;   ESOPHAGOGASTRODUODENOSCOPY (EGD) WITH PROPOFOL  N/A 06/16/2015   Procedure: ESOPHAGOGASTRODUODENOSCOPY (EGD) WITH PROPOFOL;  Surgeon: [name], MD;  [facility]: [facility];  Service: Endoscopy;  Laterality: N/A;   ESOPHAGOGASTRODUODENOSCOPY (EGD) WITH PROPOFOL N/A 11/11/2015   Procedure: ESOPHAGOGASTRODUODENOSCOPY (EGD) WITH PROPOFOL;  Surgeon: [name], MD;  Location: [facility];  Service: Endoscopy;  Laterality: N/A;   EXAMINATION UNDER ANESTHESIA  07/24/2012   Procedure: EXAM UNDER ANESTHESIA;  Surgeon: [name], MD;  Location: [facility];  Service: General;  Laterality: N/A;   humeral fracture surgery Right yrs ago   PILONIDAL CYST EXCISION  07/24/2012   Procedure: CYST EXCISION PILONIDAL SIMPLE;  Surgeon: [name], MD;  Location: [facility];  Service: General;  Laterality: N/A;  Exam Under Anesthesia,, Excision Pilonidal Disease,    right shoulder replacement  2008   x 2   UPPER GASTROINTESTINAL ENDOSCOPY     WART FULGURATION  07/24/2012   Procedure: FULGURATION ANAL WART;  Surgeon: [name], MD;  Location: [facility];  Service: General;  Laterality: N/A;  excision of raphe mass   Family History  Problem Relation Age of Onset   Arthritis Mother    Hypertension Mother    Heart disease Mother        MI age 76   Heart attack Sister    Diabetes Maternal Grandmother    Stomach cancer Paternal Grandmother    Colon cancer Neg Hx    Colon polyps Neg Hx    Esophageal cancer Neg Hx    Rectal cancer Neg Hx    Social History   Tobacco Use   Smoking status: Every Day    Current packs/day: 0.25    Average packs/day: 0.3 packs/day for 25.0 years (6.3 ttl pk-yrs)    Types: Cigarettes   Smokeless tobacco: Never   Tobacco comments:    4 or 5 cigarettes a day   Vaping Use   Vaping status: Never Used  Substance Use Topics  Alcohol use: No    Alcohol/week: 0.0 standard drinks of alcohol   Drug use: Yes    Frequency: 3.0 times per week    Types: Marijuana, Cocaine   Current Outpatient Medications  Medication  Sig Dispense Refill   albuterol (VENTOLIN HFA) 108 (90 Base) MCG/ACT inhaler Inhale 2 puffs into the lungs every 6 (six) hours as needed for wheezing or shortness of breath. 1 each 2   amLODipine (NORVASC) 5 MG tablet Take 1 tablet (5 mg total) by mouth daily. 90 tablet 3   aspirin 81 MG chewable tablet Chew 1 tablet (81 mg total) by mouth daily. 90 tablet 3   BIKTARVY 30-120-15 MG TABS TAKE 1 TABLET BY MOUTH EVERY DAY 30 tablet 11   Blood Pressure KIT 1 kit by Does not apply route daily. 1 kit 0   metoprolol succinate (TOPROL-XL) 25 MG 24 hr tablet TAKE 1/2 TABLET BY MOUTH EVERY DAY 45 tablet 2   nitroGLYCERIN (NITROSTAT) 0.4 MG SL tablet Place 1 tablet (0.4 mg total) under the tongue every 5 (five) minutes as needed for chest pain. 3 tablet 3   rosuvastatin (CRESTOR) 40 MG tablet Take 1 tablet (40 mg total) by mouth daily. 90 tablet 3   hydrocortisone 2.5 % ointment Apply topically 2 (two) times daily. Apply around rectal area. (Patient not taking: Reported on 10/03/2023) 30 g 2   No current facility-administered medications for this visit.   Allergies  Allergen Reactions   Citrus Swelling    Mouth itching and swelling     Review of Systems: Positive for arthritis, back pain, allergy and sinus problems.  All other systems reviewed and negative except where noted in HPI.   Wt Readings from Last 3 Encounters:  10/03/23 195 lb 6 oz (88.6 kg)  03/23/23 215 lb (97.5 kg)  02/03/22 228 lb (103.4 kg)    Physical Exam:  BP 128/70 (BP Location: Left Arm, Patient Position: Sitting, Cuff Size: Normal)   Pulse 76   Ht 6' (1.829 m)   Wt 195 lb 6 oz (88.6 kg)   BMI 26.50 kg/m  Constitutional:  Pleasant, generally well appearing male in no acute distress. Psychiatric:  Normal mood and affect. Behavior is normal. EENT: Pupils normal.  Conjunctivae are normal. No scleral icterus.  No obvious oral Candida Neck supple.  Cardiovascular: Normal rate, regular rhythm.  Pulmonary/chest: Effort normal  and breath sounds normal. No wheezing, rales or rhonchi. Abdominal: Soft, nondistended, nontender. Bowel sounds active throughout. There are no masses palpable. No hepatomegaly. Neurological: Alert and oriented to person place and time.  Willette Cluster, NP  10/03/2023, 2:37 PM  Cc:  Referring Provider Hoy Register, MD

## 2023-10-03 NOTE — Patient Instructions (Signed)
You have been scheduled for an endoscopy. Please follow written instructions given to you at your visit today.  If you use inhalers (even only as needed), please bring them with you on the day of your procedure.  If you take any of the following medications, they will need to be adjusted prior to your procedure:   DO NOT TAKE 7 DAYS PRIOR TO TEST- Trulicity (dulaglutide) Ozempic, Wegovy (semaglutide) Mounjaro (tirzepatide) Bydureon Bcise (exanatide extended release)  DO NOT TAKE 1 DAY PRIOR TO YOUR TEST Rybelsus (semaglutide) Adlyxin (lixisenatide) Victoza (liraglutide) Byetta (exanatide)   Call after EGD if able to swallow liquids to schedule colonoscopy.   _______________________________________________________  If your blood pressure at your visit was 140/90 or greater, please contact your primary care physician to follow up on this.  _______________________________________________________  If you are age 17 or older, your body mass index should be between 23-30. Your Body mass index is 26.5 kg/m. If this is out of the aforementioned range listed, please consider follow up with your Primary Care Provider.  If you are age 36 or younger, your body mass index should be between 19-25. Your Body mass index is 26.5 kg/m. If this is out of the aformentioned range listed, please consider follow up with your Primary Care Provider.   ________________________________________________________  The [place] [facility] providers would like to encourage you to use [facility] to communicate with providers for non-urgent requests or questions.  Due to long hold times on the telephone, sending your provider a message by [facility] may be a faster and more efficient way to get a response.  Please allow 48 business hours for a response.  Please remember that this is for non-urgent requests.  _______________________________________________________

## 2023-10-05 ENCOUNTER — Ambulatory Visit: Payer: Medicaid Other | Admitting: Infectious Diseases

## 2023-10-05 ENCOUNTER — Encounter: Payer: Self-pay | Admitting: Infectious Diseases

## 2023-10-05 VITALS — BP 120/67 | HR 63 | Temp 98.1°F | Ht 72.0 in | Wt 202.7 lb

## 2023-10-05 DIAGNOSIS — K219 Gastro-esophageal reflux disease without esophagitis: Secondary | ICD-10-CM | POA: Diagnosis not present

## 2023-10-05 DIAGNOSIS — A630 Anogenital (venereal) warts: Secondary | ICD-10-CM | POA: Diagnosis not present

## 2023-10-05 DIAGNOSIS — C469 Kaposi's sarcoma, unspecified: Secondary | ICD-10-CM | POA: Diagnosis not present

## 2023-10-05 DIAGNOSIS — B20 Human immunodeficiency virus [HIV] disease: Secondary | ICD-10-CM | POA: Diagnosis not present

## 2023-10-05 DIAGNOSIS — I10 Essential (primary) hypertension: Secondary | ICD-10-CM | POA: Diagnosis not present

## 2023-10-05 NOTE — Assessment & Plan Note (Addendum)
He has been taking his pills crushed until recently.  Will defer labs today He does not want flu shot.  He will resume his ART after his procedure 10-10-23.  Await bx.  Will see him back in 1 month

## 2023-10-05 NOTE — Assessment & Plan Note (Signed)
Will get him back in with Dr Thomas.  

## 2023-10-05 NOTE — Assessment & Plan Note (Addendum)
Appreciate GI f/u (endoscopy 12-16) Has difficulty with solids.

## 2023-10-05 NOTE — Assessment & Plan Note (Signed)
Well controlled today Possibly due to his poor nutrition.

## 2023-10-05 NOTE — Progress Notes (Signed)
Subjective:    Patient ID: Benjamin Herrera, male  DOB: [birth year], 57 y.o.        MRN: [id number]   HPI 57 yo M with HIV+, anal condyloma, achalasia, HTN. R shoulder pain (previous shoulder prosthesis).  He still has pain from this and has been told they will not remove. 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 Was taking TRV/ISN, had DRVr added at his visit 06-09-12. Then changed to tivicay and complera. At his f/u 09-2015 was changed to genvoya/darunavir due to need for ppi.  He underwent stress test (positive) then cath and PTCA of OM1 (09-2016). 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 He was started on beta-blocker and crestor at his CV f/u (01-21-17).   Has been off ART (biktarvy) for a month. Has trouble swallowing pills.  Has noted a a papular rash for over a year- no bleeding, occas itching, occas pruritus. He was supposed to be sent to derm with his last appt.    Was seen in ED 10-29-21 for accidental drug o/d after being found by bystanders. He thought this was due to cocaine.    Had GI appt because of n/v, wt loss (40# 6-7 months) and dysphagia- 10-04-23.   Has not gotten flu shot this year. Or covid.    HIV 1 RNA Quant  Date Value  04/07/2021 27 Copies/mL (H)  07/30/2020 6,280 Copies/mL (H)  11/07/2019 366 copies/mL (H)   CD4 T Cell Abs (/uL)  Date Value  04/07/2021 318 (L)  07/30/2020 291 (L)  11/07/2019 465     Health Maintenance  Topic Date Due   DTaP/Tdap/Td (1 - Tdap) Never done   Zoster Vaccines- Shingrix (1 of 2) Never done   COVID-19 Vaccine (3 - Pfizer risk series) 07/23/2020   Colonoscopy  05/19/2022   INFLUENZA VACCINE  01/23/2024 (Originally 05/26/2023)   Hepatitis C Screening  Completed   HIV Screening  Completed   HPV VACCINES  Aged Out      Review of Systems  Constitutional:  Positive for weight loss. Negative for chills and fever.  Respiratory:  Positive for cough. Negative for shortness of breath.   Gastrointestinal:  Positive for  nausea and vomiting. Negative for constipation and diarrhea.  Genitourinary:  Negative for dysuria.       Hesitancy    Please see HPI. All other systems reviewed and negative.     Objective:  Physical Exam Vitals reviewed.  Constitutional:      General: He is not in acute distress.    Appearance: Normal appearance. He is not toxic-appearing.  HENT:     Right Ear: Tympanic membrane normal. There is no impacted cerumen.     Left Ear: There is no impacted cerumen. Tympanic membrane is retracted.     Mouth/Throat:     Mouth: Mucous membranes are moist.     Pharynx: No oropharyngeal exudate.  Eyes:     Extraocular Movements: Extraocular movements intact.     Pupils: Pupils are equal, round, and reactive to light.  Cardiovascular:     Rate and Rhythm: Normal rate and regular rhythm.  Pulmonary:     Effort: Pulmonary effort is normal.     Breath sounds: Normal breath sounds.  Abdominal:     General: Bowel sounds are normal. There is no distension.     Palpations: Abdomen is soft.     Tenderness: There is no abdominal tenderness.  Musculoskeletal:     Cervical back: Normal range  of motion and neck supple.     Right lower leg: No edema.     Left lower leg: No edema.  Neurological:     General: No focal deficit present.     Mental Status: He is alert.        Assessment & Plan:

## 2023-10-05 NOTE — Assessment & Plan Note (Signed)
I am not sure the lesions on his leg, which have been present for many years, are KS. He needs bx, I offered to do this today, he deffered and wants to be seen by derm.

## 2023-10-08 ENCOUNTER — Encounter: Payer: Self-pay | Admitting: Certified Registered Nurse Anesthetist

## 2023-10-10 ENCOUNTER — Encounter: Payer: Self-pay | Admitting: Gastroenterology

## 2023-10-10 ENCOUNTER — Other Ambulatory Visit: Payer: Self-pay | Admitting: Gastroenterology

## 2023-10-10 ENCOUNTER — Ambulatory Visit: Payer: Medicaid Other | Admitting: Gastroenterology

## 2023-10-10 VITALS — BP 133/82 | HR 74 | Temp 98.2°F | Resp 14 | Ht 72.0 in | Wt 195.0 lb

## 2023-10-10 DIAGNOSIS — K222 Esophageal obstruction: Secondary | ICD-10-CM

## 2023-10-10 DIAGNOSIS — R1310 Dysphagia, unspecified: Secondary | ICD-10-CM

## 2023-10-10 DIAGNOSIS — K209 Esophagitis, unspecified without bleeding: Secondary | ICD-10-CM

## 2023-10-10 DIAGNOSIS — K2970 Gastritis, unspecified, without bleeding: Secondary | ICD-10-CM

## 2023-10-10 DIAGNOSIS — I252 Old myocardial infarction: Secondary | ICD-10-CM | POA: Diagnosis not present

## 2023-10-10 DIAGNOSIS — G4730 Sleep apnea, unspecified: Secondary | ICD-10-CM | POA: Diagnosis not present

## 2023-10-10 DIAGNOSIS — K319 Disease of stomach and duodenum, unspecified: Secondary | ICD-10-CM | POA: Diagnosis not present

## 2023-10-10 DIAGNOSIS — K3189 Other diseases of stomach and duodenum: Secondary | ICD-10-CM

## 2023-10-10 DIAGNOSIS — K2100 Gastro-esophageal reflux disease with esophagitis, without bleeding: Secondary | ICD-10-CM

## 2023-10-10 DIAGNOSIS — I2510 Atherosclerotic heart disease of native coronary artery without angina pectoris: Secondary | ICD-10-CM | POA: Diagnosis not present

## 2023-10-10 MED ORDER — SODIUM CHLORIDE 0.9 % IV SOLN: 500.0000 mL | INTRAVENOUS | Status: DC

## 2023-10-10 MED ORDER — FLUCONAZOLE 100 MG PO TABS
100.0000 mg | ORAL_TABLET | Freq: Every day | ORAL | 0 refills | Status: AC
Start: 2023-10-10 — End: 2023-10-31

## 2023-10-10 NOTE — Progress Notes (Unsigned)
Called to room to assist during endoscopic procedure.  Patient ID and intended procedure confirmed with present staff. Received instructions for my participation in the procedure from the performing physician.  

## 2023-10-10 NOTE — Patient Instructions (Addendum)
Resume previous diet Continue present medications Await pathology results  Handouts/information given for gastritis, candida infection and esophagitis  YOU HAD AN ENDOSCOPIC PROCEDURE TODAY AT THE [facility]:   Refer to the procedure report that was given to you for any specific questions about what was found during the examination.  If the procedure report does not answer your questions, please call your gastroenterologist to clarify.  If you requested that your care partner not be given the details of your procedure findings, then the procedure report has been included in a sealed envelope for you to review at your convenience later.  YOU SHOULD EXPECT: Some feelings of bloating in the abdomen. Passage of more gas than usual.  Walking can help get rid of the air that was put into your GI tract during the procedure and reduce the bloating. If you had a lower endoscopy (such as a colonoscopy or flexible sigmoidoscopy) you may notice spotting of blood in your stool or on the toilet paper. If you underwent a bowel prep for your procedure, you may not have a normal bowel movement for a few days.  Please Note:  You might notice some irritation and congestion in your nose or some drainage.  This is from the oxygen used during your procedure.  There is no need for concern and it should clear up in a day or so.  SYMPTOMS TO REPORT IMMEDIATELY:  Following upper endoscopy (EGD)  Vomiting of blood or coffee ground material  New chest pain or pain under the shoulder blades  Painful or persistently difficult swallowing  New shortness of breath  Fever of 100F or higher  Black, tarry-looking stools  For urgent or emergent issues, a gastroenterologist can be reached at any hour by calling [phone]. Do not use MyChart messaging for urgent concerns.   DIET:  We do recommend a small meal at first, but then you may proceed to your regular diet.  Drink plenty of fluids but you should avoid  alcoholic beverages for 24 hours.  ACTIVITY:  You should plan to take it easy for the rest of today and you should NOT DRIVE or use heavy machinery until tomorrow (because of the sedation medicines used during the test).    FOLLOW UP: Our staff will call the number listed on your records the next business day following your procedure.  We will call around 7:15- 8:00 am to check on you and address any questions or concerns that you may have regarding the information given to you following your procedure. If we do not reach you, we will leave a message.     If any biopsies were taken you will be contacted by phone or by letter within the next 1-3 weeks.  Please call us at [phone] if you have not heard about the biopsies in 3 weeks.   SIGNATURES/CONFIDENTIALITY: You and/or your care partner have signed paperwork which will be entered into your electronic medical record.  These signatures attest to the fact that that the information above on your After Visit Summary has been reviewed and is understood.  Full responsibility of the confidentiality of this discharge information lies with you and/or your care-partner.

## 2023-10-10 NOTE — Progress Notes (Unsigned)
1320 Robinul 0.1 mg IV given due large amount of secretions upon assessment.  MD made aware, vss  ?

## 2023-10-10 NOTE — Progress Notes (Unsigned)
Pt's states no medical or surgical changes since previsit or office visit. 

## 2023-10-10 NOTE — Op Note (Signed)
[facility] Patient Name: Benjamin Herrera Procedure Date: 10/10/2023 1:34 PM MRN: [id number] Endoscopist: [name] , MD, [id number] Age: 57 Referring MD:  Date of Birth: [birth year] Gender: Male Account #: [id number] Procedure:                Upper GI endoscopy Indications:              Dysphagia Medicines:                Monitored Anesthesia Care Procedure:                Pre-Anesthesia Assessment:                           - Prior to the procedure, a History and Physical                            was performed, and patient medications and                            allergies were reviewed. The patient's tolerance of                            previous anesthesia was also reviewed. The risks                            and benefits of the procedure and the sedation                            options and risks were discussed with the patient.                            All questions were answered, and informed consent                            was obtained. Prior Anticoagulants: The patient has                            taken no anticoagulant or antiplatelet agents. ASA                            Grade Assessment: III - A patient with severe                            systemic disease. After reviewing the risks and                            benefits, the patient was deemed in satisfactory                            condition to undergo the procedure.                           After obtaining informed consent, the endoscope was  passed under direct vision. Throughout the                            procedure, the patient's blood pressure, pulse, and                            oxygen saturations were monitored continuously. The                            GIF [id number] #[id number] was introduced through the                            mouth, and advanced to the second part of duodenum.                            The upper GI endoscopy was  accomplished without                            difficulty. The patient tolerated the procedure                            well. Scope In: Scope Out: Findings:                 LA Grade C (one or more mucosal breaks continuous                            between tops of 2 or more mucosal folds, less than                            75% circumference) esophagitis with no bleeding was                            found 30 to 40 cm from the incisors. Biopsies were                            taken with a cold forceps for histology.                           One benign-appearing, intrinsic mild stenosis was                            found 39 to 40 cm from the incisors. This stenosis                            measured less than one cm (in length). The stenosis                            was traversed. A guidewire was placed and the scope                            was withdrawn. Dilation was performed with a Savary  dilator with mild resistance at 18 mm. The dilation                            site was examined following endoscope reinsertion                            and showed mild mucosal disruption.                           Patchy mild inflammation characterized by                            congestion (edema), erosions, erythema and                            friability was found in the gastric antrum and in                            the prepyloric region of the stomach. Biopsies were                            taken with a cold forceps for Helicobacter pylori                            testing.                           The cardia and gastric fundus were normal on                            retroflexion.                           The examined duodenum was normal. Complications:            No immediate complications. Estimated Blood Loss:     Estimated blood loss was minimal. Impression:               - LA Grade C candidiasis esophagitis with no                             bleeding. Biopsied.                           - Benign-appearing esophageal stenosis. Dilated.                           - Gastritis. Biopsied.                           - Normal examined duodenum. Recommendation:           - Patient has a contact number available for                            emergencies. The signs and symptoms of potential  delayed complications were discussed with the                            patient. Return to normal activities tomorrow.                            Written discharge instructions were provided to the                            patient.                           - Resume previous diet.                           - Continue present medications.                           - Await pathology results.                           - Diflucan (fluconazole) 100 mg PO daily for 3                            weeks. [name], MD 10/10/2023 2:12:44 PM This report has been signed electronically.

## 2023-10-10 NOTE — Progress Notes (Unsigned)
Report given to PACU, vss 

## 2023-10-10 NOTE — Progress Notes (Unsigned)
[name] History and Physical   Primary Care Physician:  Hoy Register, MD   Reason for Procedure:  Dysphagia  Plan:    EGD with possible interventions as needed     HPI: Benjamin Herrera is a very pleasant 57 y.o. male here for dysphagia.  The risks and benefits as well as alternatives of endoscopic procedure(s) have been discussed and reviewed. All questions answered. The patient agrees to proceed.    Past Medical History:  Diagnosis Date   Achalasia and cardiospasm    Allergy    Arthritis    back ,shoulders    Asthma    Boils    under arms hx of   CAD (coronary artery disease)    a. 09/2016: cath showing 100% RCA occlusion with collaterals and 90% OM1 (treated with DES)   Candidiasis of mouth    Candidiasis of unspecified site    Cholelithiasis    Cocaine abuse (HCC)    Quit 09/2016   Condyloma acuminatum in male of scrotum & anal canal s/p laser ablation 07/03/2012   Coronary artery disease    Dysphagia    Fracture, humerus    GERD (gastroesophageal reflux disease)    Heart attack (HCC)    Hemorrhoids    Hepatitis 1993   A   Herpes labialis    HIV (human immunodeficiency virus infection) (HCC) DX 1993   Hypertension Dx 2014   off all bp meds for last 9 or 10 months   Pilonidal disease 01/10/2012   Polysubstance abuse (HCC)    Psoriasis    hx of   Sleep apnea    does  have CPAP   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/03/2012   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 08/03/2012    Past Surgical History:  Procedure Laterality Date   BALLOON DILATION N/A 11/11/2015   Procedure: BALLOON DILATION;  Surgeon: [name], MD;  Location: [facility];  Service: Endoscopy;  Laterality: N/A;   BOTOX INJECTION  10/11/2012   Procedure: BOTOX INJECTION;  Surgeon: [name], MD;  Location: [facility];  Service: Endoscopy;  Laterality: N/A;   BOTOX INJECTION N/A 01/25/2013    Procedure: BOTOX INJECTION;  Surgeon: [name], MD;  Location: [facility];  Service: Endoscopy;  Laterality: N/A;   BOTOX INJECTION N/A 06/21/2013   Procedure: BOTOX INJECTION;  Surgeon: [name], MD;  Location: [facility];  Service: Endoscopy;  Laterality: N/A;   BOTOX INJECTION N/A 10/08/2013   Procedure: BOTOX INJECTION;  Surgeon: [name], MD;  Location: [facility];  Service: Endoscopy;  Laterality: N/A;   BOTOX INJECTION N/A 06/16/2015   Procedure: BOTOX INJECTION;  Surgeon: [name], MD;  Location: [facility];  Service: Endoscopy;  Laterality: N/A;   CARDIAC CATHETERIZATION N/A 10/11/2016   Procedure: Left Heart Cath and Coronary Angiography;  Surgeon: Kathleene Hazel, MD;  mRCA 100% w/ L>R collaterals, OM1 90%   CARDIAC CATHETERIZATION N/A 10/11/2016   Procedure: Coronary Stent Intervention;  Surgeon: Kathleene Hazel, MD;  RESOLUTE ONYX 3.5X15 DES OM1   COLONOSCOPY  05/19/2012   Procedure: COLONOSCOPY;  Surgeon: [name], MD;  Location: [facility];  Service: Endoscopy;  Laterality: N/A;  jill trying to contact pt to come in 0830 for 930 case, phone not accepting messages   COLONOSCOPY     ESOPHAGEAL MANOMETRY  05/29/2012   Procedure: ESOPHAGEAL MANOMETRY (EM);  Surgeon: Molly Maduro  Rosalio Macadamia, MD;  Location: [facility];  Service: Endoscopy;  Laterality: N/A;   ESOPHAGEAL MANOMETRY N/A 10/06/2015   Procedure: ESOPHAGEAL MANOMETRY (EM);  Surgeon: [name], MD;  Location: [facility];  Service: Endoscopy;  Laterality: N/A;   ESOPHAGOGASTRODUODENOSCOPY  11/11/2011   Procedure: ESOPHAGOGASTRODUODENOSCOPY (EGD);  Surgeon: [name], MD;  Location: [facility];  Service: Endoscopy;  Laterality: N/A;  botox injection  called Pt to change time of procedure per Dr Arlyce Dice   ESOPHAGOGASTRODUODENOSCOPY  03/10/2012   Procedure: ESOPHAGOGASTRODUODENOSCOPY (EGD);  Surgeon: [name], MD;  Location: [facility];  Service: Endoscopy;   Laterality: N/A;   ESOPHAGOGASTRODUODENOSCOPY  10/11/2012   Procedure: ESOPHAGOGASTRODUODENOSCOPY (EGD);  Surgeon: [name], MD;  Location: [facility];  Service: Endoscopy;  Laterality: N/A;   ESOPHAGOGASTRODUODENOSCOPY N/A 01/25/2013   Procedure: ESOPHAGOGASTRODUODENOSCOPY (EGD);  Surgeon: [name], MD;  Location: [facility];  Service: Endoscopy;  Laterality: N/A;   ESOPHAGOGASTRODUODENOSCOPY N/A 06/21/2013   Procedure: ESOPHAGOGASTRODUODENOSCOPY (EGD);  Surgeon: [name], MD;  Location: [facility];  Service: Endoscopy;  Laterality: N/A;   ESOPHAGOGASTRODUODENOSCOPY (EGD) WITH PROPOFOL N/A 10/08/2013   Procedure: ESOPHAGOGASTRODUODENOSCOPY (EGD) WITH PROPOFOL;  Surgeon: [name], MD;  Location: [facility];  Service: Endoscopy;  Laterality: N/A;   ESOPHAGOGASTRODUODENOSCOPY (EGD) WITH PROPOFOL N/A 06/16/2015   Procedure: ESOPHAGOGASTRODUODENOSCOPY (EGD) WITH PROPOFOL;  Surgeon: [name], MD;  Location: [facility];  Service: Endoscopy;  Laterality: N/A;   ESOPHAGOGASTRODUODENOSCOPY (EGD) WITH PROPOFOL N/A 11/11/2015   Procedure: ESOPHAGOGASTRODUODENOSCOPY (EGD) WITH PROPOFOL;  Surgeon: [name], MD;  Location: [facility];  Service: Endoscopy;  Laterality: N/A;   EXAMINATION UNDER ANESTHESIA  07/24/2012   Procedure: EXAM UNDER ANESTHESIA;  Surgeon: [name], MD;  Location: [facility];  Service: General;  Laterality: N/A;   humeral fracture surgery Right yrs ago   PILONIDAL CYST EXCISION  07/24/2012   Procedure: CYST EXCISION PILONIDAL SIMPLE;  Surgeon: [name], MD;  Location: [facility];  Service: General;  Laterality: N/A;  Exam Under Anesthesia,, Excision Pilonidal Disease,    right shoulder replacement  2008   x 2   UPPER GASTROINTESTINAL ENDOSCOPY     WART FULGURATION  07/24/2012   Procedure: FULGURATION ANAL WART;  Surgeon: [name], MD;  Location: [facility];  Service: General;  Laterality: N/A;  excision of raphe mass    Prior  to Admission medications   Medication Sig Start Date End Date Taking? Authorizing Provider  amLODipine (NORVASC) 5 MG tablet Take 1 tablet (5 mg total) by mouth daily. 03/23/23 10/10/23 Yes Alver Sorrow, NP  aspirin 81 MG chewable tablet Chew 1 tablet (81 mg total) by mouth daily. 10/28/16  Yes Dessa Phi, MD  BIKTARVY 30-120-15 MG TABS TAKE 1 TABLET BY MOUTH EVERY DAY 09/29/23  Yes [name], MD  Blood Pressure KIT 1 kit by Does not apply route daily. 03/23/23  Yes Alver Sorrow, NP  metoprolol succinate (TOPROL-XL) 25 MG 24 hr tablet TAKE 1/2 TABLET BY MOUTH EVERY DAY 12/10/22  Yes Alver Sorrow, NP  rosuvastatin (CRESTOR) 40 MG tablet Take 1 tablet (40 mg total) by mouth daily. 03/23/23  Yes Alver Sorrow, NP  albuterol (VENTOLIN HFA) 108 (90 Base) MCG/ACT inhaler Inhale 2 puffs into the lungs every 6 (six) hours as needed for wheezing or shortness of breath. 04/07/21   [name], MD  hydrocortisone 2.5 % ointment Apply topically 2 (two) times daily. Apply around rectal area. 02/06/21  Pham, Minh Q, RPH-CPP  nitroGLYCERIN (NITROSTAT) 0.4 MG SL tablet Place 1 tablet (0.4 mg total) under the tongue every 5 (five) minutes as needed for chest pain. 04/07/21   [name], MD    Current Outpatient Medications  Medication Sig Dispense Refill   amLODipine (NORVASC) 5 MG tablet Take 1 tablet (5 mg total) by mouth daily. 90 tablet 3   aspirin 81 MG chewable tablet Chew 1 tablet (81 mg total) by mouth daily. 90 tablet 3   BIKTARVY 30-120-15 MG TABS TAKE 1 TABLET BY MOUTH EVERY DAY 30 tablet 11   Blood Pressure KIT 1 kit by Does not apply route daily. 1 kit 0   metoprolol succinate (TOPROL-XL) 25 MG 24 hr tablet TAKE 1/2 TABLET BY MOUTH EVERY DAY 45 tablet 2   rosuvastatin (CRESTOR) 40 MG tablet Take 1 tablet (40 mg total) by mouth daily. 90 tablet 3   albuterol (VENTOLIN HFA) 108 (90 Base) MCG/ACT inhaler Inhale 2 puffs into the lungs every 6 (six) hours as needed  for wheezing or shortness of breath. 1 each 2   hydrocortisone 2.5 % ointment Apply topically 2 (two) times daily. Apply around rectal area. 30 g 2   nitroGLYCERIN (NITROSTAT) 0.4 MG SL tablet Place 1 tablet (0.4 mg total) under the tongue every 5 (five) minutes as needed for chest pain. 3 tablet 3   Current Facility-Administered Medications  Medication Dose Route Frequency Provider Last Rate Last Admin   0.9 %  sodium chloride infusion  500 mL Intravenous Continuous Asiah Browder, Eleonore Chiquito, MD        Allergies as of 10/10/2023 - Review Complete 10/10/2023  Allergen Reaction Noted   Citrus Swelling 11/04/2016    Family History  Problem Relation Age of Onset   Arthritis Mother    Hypertension Mother    Heart disease Mother        MI age 13   Heart attack Sister    Diabetes Maternal Grandmother    Stomach cancer Paternal Grandmother    Colon cancer Neg Hx    Colon polyps Neg Hx    Esophageal cancer Neg Hx    Rectal cancer Neg Hx     Social History   Socioeconomic History   Marital status: Single    Spouse name: Not on file   Number of children: 0   Years of education: Not on file   Highest education level: Not on file  Occupational History   Occupation: Disabled  Tobacco Use   Smoking status: Every Day    Current packs/day: 0.50    Average packs/day: 0.4 packs/day for 50.0 years (18.8 ttl pk-yrs)    Types: Cigarettes    Start date: 10/04/1998   Smokeless tobacco: Never   Tobacco comments:    4 or 5 cigarettes a day   Vaping Use   Vaping status: Never Used  Substance and Sexual Activity   Alcohol use: No    Alcohol/week: 0.0 standard drinks of alcohol   Drug use: Not Currently    Frequency: 3.0 times per week    Types: Marijuana, Cocaine   Sexual activity: Not Currently    Partners: Male    Comment: pt. given condoms, used marijuana -3 days ago 05/14/16  Other Topics Concern   Not on file  Social History Narrative   Pt lives in a ground floor apartment in [place].    Social Drivers of Corporate investment banker Strain: Not on file  Food Insecurity: Not on  file  Transportation Needs: Not on file  Physical Activity: Not on file  Stress: Not on file  Social Connections: Not on file  Intimate Partner Violence: Not on file    Review of Systems:  All other review of systems negative except as mentioned in the HPI.  Physical Exam: Vital signs in last 24 hours: BP 128/74   Pulse (!) 59   Temp 98.2 F (36.8 C)   Resp 16   Ht 6' (1.829 m)   Wt 195 lb (88.5 kg)   SpO2 100%   BMI 26.45 kg/m  General:   Alert, NAD Lungs:  Clear .   Heart:  Regular rate and rhythm Abdomen:  Soft, nontender and nondistended. Neuro/Psych:  Alert and cooperative. Normal mood and affect. A and O x 3  Reviewed labs, radiology imaging, old records and pertinent past GI work up  Patient is appropriate for planned procedure(s) and anesthesia in an ambulatory setting   K. Scherry Ran , MD [phone]

## 2023-10-11 ENCOUNTER — Telehealth: Payer: Self-pay | Admitting: *Deleted

## 2023-10-11 NOTE — Telephone Encounter (Signed)
Attempted f/u phone call. No answer. Left message. °

## 2023-10-14 LAB — SURGICAL PATHOLOGY

## 2023-10-17 ENCOUNTER — Telehealth: Payer: Self-pay | Admitting: Gastroenterology

## 2023-10-17 NOTE — Telephone Encounter (Signed)
Spoke with the patient. He reports that he has not had any improvement in his dysphagia. He is not worse. He does not have pain. "Same as before the procedure." He is taking the medication fluconazole as ordered. He can keep done some fluids. "Sometimes they go down and sometimes they do not." "I do not need to go to the emergency room. It's just not better."

## 2023-10-17 NOTE — Telephone Encounter (Signed)
Patient said he had an endo done last Monday and still having issues swallowing. Would like to discuss

## 2023-10-20 NOTE — Telephone Encounter (Signed)
He does have significant esophageal dysmotility dilated esophagus, he will need to follow antireflux measures, complete the course of Diflucan.  Will obtain esophageal manometry to further evaluate, please schedule next available office visit with me.  Thank you.

## 2023-10-21 NOTE — Telephone Encounter (Signed)
Spoke with the patient and scheduled a follow up appointment.

## 2023-11-06 ENCOUNTER — Encounter: Payer: Self-pay | Admitting: Gastroenterology

## 2023-11-08 ENCOUNTER — Ambulatory Visit: Payer: Medicaid Other | Attending: Family Medicine | Admitting: Family Medicine

## 2023-11-08 ENCOUNTER — Encounter: Payer: Self-pay | Admitting: Family Medicine

## 2023-11-08 ENCOUNTER — Ambulatory Visit
Admission: RE | Admit: 2023-11-08 | Discharge: 2023-11-08 | Disposition: A | Discharge status: Home / Self Care | Payer: Medicaid Other | Source: Ambulatory Visit | Attending: Family Medicine | Admitting: Family Medicine

## 2023-11-08 VITALS — BP 118/65 | HR 74 | Ht 72.0 in | Wt 209.8 lb

## 2023-11-08 DIAGNOSIS — M25511 Pain in right shoulder: Secondary | ICD-10-CM

## 2023-11-08 DIAGNOSIS — G8929 Other chronic pain: Secondary | ICD-10-CM

## 2023-11-08 DIAGNOSIS — M25512 Pain in left shoulder: Secondary | ICD-10-CM

## 2023-11-08 DIAGNOSIS — Z139 Encounter for screening, unspecified: Secondary | ICD-10-CM | POA: Diagnosis not present

## 2023-11-08 DIAGNOSIS — I25119 Atherosclerotic heart disease of native coronary artery with unspecified angina pectoris: Secondary | ICD-10-CM

## 2023-11-08 DIAGNOSIS — B20 Human immunodeficiency virus [HIV] disease: Secondary | ICD-10-CM | POA: Diagnosis not present

## 2023-11-08 DIAGNOSIS — I10 Essential (primary) hypertension: Secondary | ICD-10-CM | POA: Diagnosis not present

## 2023-11-08 MED ORDER — MELOXICAM 7.5 MG PO TABS: 7.5000 mg | ORAL_TABLET | Freq: Every day | ORAL | 1 refills | Status: DC

## 2023-11-08 MED ORDER — DULOXETINE HCL 60 MG PO CPEP: 60.0000 mg | ORAL_CAPSULE | Freq: Every day | ORAL | 1 refills | Status: DC

## 2023-11-08 NOTE — Progress Notes (Signed)
 Subjective:  Patient ID: Benjamin Herrera, male    DOB: [birth year]  Age: 58 y.o. MRN: [id number]  CC: Shoulder Pain (Pain in both shoulders)   HPI Benjamin Herrera is a 58 y.o. year old male with a history of HIV, hypertension, coronary artery disease (status post DES to OM1 in 2017), chronic low back pain.  Interval History: Discussed the use of AI scribe software for clinical note transcription with the patient, who gave verbal consent to proceed.  History of Present Illness   The patient presents with bilateral shoulder pain that has been ongoing for two weeks. He reports that the pain is worse in the left shoulder, despite having a rod in the right arm. The patient describes the pain as severe enough to limit his ability to lift objects, such as a two-liter soda or large cup. He denies any associated neck pain or numbness/tingling in the arms. The patient has a history of chronic pain in the right shoulder due to the rod, which has been present for almost twenty years. He reports that the pain is constant and does not vary with time of day, and that over-the-counter Tylenol  provides only temporary relief. The patient also mentions difficulty swallowing and a recent esophageal dilation procedure that did not improve his symptoms.   For CAD and hypertension he recently had a visit with the cardiology nurse practitioner.  He continues to follow-up with infectious disease for management of HIV and remains on his antiretroviral therapy.  He has an upcoming appointment for labs ordered by infectious disease.       Past Medical History:  Diagnosis Date   Achalasia and cardiospasm    Allergy    Arthritis    back ,shoulders    Asthma    Boils    under arms hx of   CAD (coronary artery disease)    a. 09/2016: cath showing 100% RCA occlusion with collaterals and 90% OM1 (treated with DES)   Candidiasis of mouth    Candidiasis of unspecified site    Cholelithiasis    Cocaine abuse (HCC)     Quit 09/2016   Condyloma acuminatum in male of scrotum & anal canal s/p laser ablation 07/03/2012   Coronary artery disease    Dysphagia    Fracture, humerus    GERD (gastroesophageal reflux disease)    Heart attack (HCC)    Hemorrhoids    Hepatitis 1993   A   Herpes labialis    HIV (human immunodeficiency virus infection) (HCC) DX 1993   Hypertension Dx 2014   off all bp meds for last 9 or 10 months   Pilonidal disease 01/10/2012   Polysubstance abuse (HCC)    Psoriasis    hx of   Sleep apnea    does  have CPAP   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/03/2012   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 08/03/2012    Past Surgical History:  Procedure Laterality Date   BALLOON DILATION N/A 11/11/2015   Procedure: BALLOON DILATION;  Surgeon: Benjamin Shila GAILS, MD;  [facility]: [facility];  Service: Endoscopy;  Laterality: N/A;   BOTOX  INJECTION  10/11/2012   Procedure: BOTOX  INJECTION;  Surgeon: Benjamin JONETTA Aho, MD;  [facility];  Service: Endoscopy;  Laterality: N/A;   BOTOX  INJECTION N/A 01/25/2013   Procedure: BOTOX  INJECTION;  Surgeon: Benjamin JONETTA Aho, MD;  [facility];  Service: Endoscopy;  Laterality:  N/A;   BOTOX  INJECTION N/A 06/21/2013   Procedure: BOTOX  INJECTION;  Surgeon: Benjamin JONETTA Aho, MD;  [facility];  Service: Endoscopy;  Laterality: N/A;   BOTOX  INJECTION N/A 10/08/2013   Procedure: BOTOX  INJECTION;  Surgeon: Benjamin JONETTA Aho, MD;  [facility];  Service: Endoscopy;  Laterality: N/A;   BOTOX  INJECTION N/A 06/16/2015   Procedure: BOTOX  INJECTION;  Surgeon: Benjamin JONETTA Aho, MD;  [facility];  Service: Endoscopy;  Laterality: N/A;   CARDIAC CATHETERIZATION N/A 10/11/2016   Procedure: Left Heart Cath and Coronary Angiography;  Surgeon: Benjamin JONETTA Cash, MD;  mRCA 100% w/ L>R collaterals, OM1 90%   CARDIAC CATHETERIZATION N/A 10/11/2016   Procedure:  Coronary Stent Intervention;  Surgeon: Benjamin JONETTA Cash, MD;  RESOLUTE ONYX 3.5X15 DES OM1   COLONOSCOPY  05/19/2012   Procedure: COLONOSCOPY;  Surgeon: Benjamin JONETTA Aho, MD;  [facility];  Service: Endoscopy;  Laterality: N/A;  jill trying to contact pt to come in 0830 for 930 case, phone not accepting messages   COLONOSCOPY     ESOPHAGEAL MANOMETRY  05/29/2012   Procedure: ESOPHAGEAL MANOMETRY (EM);  Surgeon: Benjamin JONETTA Aho, MD;  [facility];  Service: Endoscopy;  Laterality: N/A;   ESOPHAGEAL MANOMETRY N/A 10/06/2015   Procedure: ESOPHAGEAL MANOMETRY (EM);  Surgeon: Benjamin Shila GAILS, MD;  [facility];  Service: Endoscopy;  Laterality: N/A;   ESOPHAGOGASTRODUODENOSCOPY  11/11/2011   Procedure: ESOPHAGOGASTRODUODENOSCOPY (EGD);  Surgeon: Benjamin JONETTA Aho, MD;  Location: [facility];  Service: Endoscopy;  Laterality: N/A;  botox  injection  called Pt to change time of procedure per Dr Herrera   ESOPHAGOGASTRODUODENOSCOPY  03/10/2012   Procedure: ESOPHAGOGASTRODUODENOSCOPY (EGD);  Surgeon: Benjamin JONETTA Aho, MD;  Location: [facility];  Service: Endoscopy;  Laterality: N/A;   ESOPHAGOGASTRODUODENOSCOPY  10/11/2012   Procedure: ESOPHAGOGASTRODUODENOSCOPY (EGD);  Surgeon: Benjamin JONETTA Aho, MD;  Location: [facility];  Service: Endoscopy;  Laterality: N/A;   ESOPHAGOGASTRODUODENOSCOPY N/A 01/25/2013   Procedure: ESOPHAGOGASTRODUODENOSCOPY (EGD);  Surgeon: Benjamin JONETTA Aho, MD;  Location: [facility];  Service: Endoscopy;  Laterality: N/A;   ESOPHAGOGASTRODUODENOSCOPY N/A 06/21/2013   Procedure: ESOPHAGOGASTRODUODENOSCOPY (EGD);  Surgeon: Benjamin JONETTA Aho, MD;  Location: [facility];  Service: Endoscopy;  Laterality: N/A;   ESOPHAGOGASTRODUODENOSCOPY (EGD) WITH PROPOFOL  N/A 10/08/2013   Procedure: ESOPHAGOGASTRODUODENOSCOPY (EGD) WITH PROPOFOL ;  Surgeon: Benjamin JONETTA Aho, MD;  [facility];  Service: Endoscopy;  Laterality: N/A;   ESOPHAGOGASTRODUODENOSCOPY  (EGD) WITH PROPOFOL  N/A 06/16/2015   Procedure: ESOPHAGOGASTRODUODENOSCOPY (EGD) WITH PROPOFOL ;  Surgeon: Benjamin JONETTA Aho, MD;  [facility];  Service: Endoscopy;  Laterality: N/A;   ESOPHAGOGASTRODUODENOSCOPY (EGD) WITH PROPOFOL  N/A 11/11/2015   Procedure: ESOPHAGOGASTRODUODENOSCOPY (EGD) WITH PROPOFOL ;  Surgeon: Benjamin Shila GAILS, MD;  [facility];  Service: Endoscopy;  Laterality: N/A;   EXAMINATION UNDER ANESTHESIA  07/24/2012   Procedure: EXAM UNDER ANESTHESIA;  Surgeon: Elspeth KYM Schultze, MD;  [facility];  Service: General;  Laterality: N/A;   humeral fracture surgery Right yrs ago   PILONIDAL CYST EXCISION  07/24/2012   Procedure: CYST EXCISION PILONIDAL SIMPLE;  Surgeon: Elspeth KYM Schultze, MD;  [facility];  Service: General;  Laterality: N/A;  Exam Under Anesthesia,, Excision Pilonidal Disease,    right shoulder replacement  2008   x 2   UPPER GASTROINTESTINAL ENDOSCOPY     WART FULGURATION  07/24/2012   Procedure: FULGURATION ANAL WART;  Surgeon: Elspeth KYM Schultze, MD;  [facility];  Service: General;  Laterality: N/A;  excision  of raphe mass    Family History  Problem Relation Age of Onset   Arthritis Mother    Hypertension Mother    Heart disease Mother        MI age 73   Heart attack Sister    Diabetes Maternal Grandmother    Stomach cancer Paternal Grandmother    Colon cancer Neg Hx    Colon polyps Neg Hx    Esophageal cancer Neg Hx    Rectal cancer Neg Hx     Social History   Socioeconomic History   Marital status: Single    Spouse name: Not on file   Number of children: 0   Years of education: Not on file   Highest education level: Not on file  Occupational History   Occupation: Disabled  Tobacco Use   Smoking status: Every Day    Current packs/day: 0.50    Average packs/day: 0.4 packs/day for 50.1 years (18.8 ttl pk-yrs)    Types: Cigarettes    Start date: 10/04/1998   Smokeless tobacco: Never   Tobacco comments:    4 or 5  cigarettes a day   Vaping Use   Vaping status: Never Used  Substance and Sexual Activity   Alcohol use: No    Alcohol/week: 0.0 standard drinks of alcohol   Drug use: Not Currently    Frequency: 3.0 times per week    Types: Marijuana, Cocaine   Sexual activity: Not Currently    Partners: Male    Comment: pt. given condoms, used marijuana -3 days ago 05/14/16  Other Topics Concern   Not on file  Social History Narrative   Pt lives in a ground floor apartment in [place].   Social Drivers of Corporate Investment Banker Strain: Low Risk  (11/08/2023)   Overall Financial Resource Strain (CARDIA)    Difficulty of Paying Living Expenses: Not very hard  Food Insecurity: Food Insecurity Present (11/08/2023)   Hunger Vital Sign    Worried About Running Out of Food in the Last Year: Sometimes true    Ran Out of Food in the Last Year: Often true  Transportation Needs: No Transportation Needs (11/08/2023)   PRAPARE - Administrator, Civil Service (Medical): No    Lack of Transportation (Non-Medical): No  Physical Activity: Inactive (11/08/2023)   Exercise Vital Sign    Days of Exercise per Week: 0 days    Minutes of Exercise per Session: 0 min  Stress: No Stress Concern Present (11/08/2023)   [employer] of Occupational Health - Occupational Stress Questionnaire    Feeling of Stress : Not at all  Social Connections: Socially Isolated (11/08/2023)   Social Connection and Isolation Panel [NHANES]    Frequency of Communication with Friends and Family: Three times a week    Frequency of Social Gatherings with Friends and Family: Three times a week    Attends Religious Services: Never    Active Member of Clubs or Organizations: No    Attends Banker Meetings: Never    Marital Status: Never married    Allergies  Allergen Reactions   Citrus Swelling    Mouth itching and swelling     Outpatient Medications Prior to Visit  Medication Sig Dispense Refill   albuterol   (VENTOLIN  HFA) 108 (90 Base) MCG/ACT inhaler Inhale 2 puffs into the lungs every 6 (six) hours as needed for wheezing or shortness of breath. 1 each 2   aspirin  81 MG chewable tablet Chew 1  tablet (81 mg total) by mouth daily. 90 tablet 3   BIKTARVY  30-120-15 MG TABS TAKE 1 TABLET BY MOUTH EVERY DAY 30 tablet 11   Blood Pressure KIT 1 kit by Does not apply route daily. 1 kit 0   hydrocortisone  2.5 % ointment Apply topically 2 (two) times daily. Apply around rectal area. 30 g 2   metoprolol  succinate (TOPROL -XL) 25 MG 24 hr tablet TAKE 1/2 TABLET BY MOUTH EVERY DAY 45 tablet 2   nitroGLYCERIN  (NITROSTAT ) 0.4 MG SL tablet Place 1 tablet (0.4 mg total) under the tongue every 5 (five) minutes as needed for chest pain. 3 tablet 3   rosuvastatin  (CRESTOR ) 40 MG tablet Take 1 tablet (40 mg total) by mouth daily. 90 tablet 3   amLODipine  (NORVASC ) 5 MG tablet Take 1 tablet (5 mg total) by mouth daily. 90 tablet 3   No facility-administered medications prior to visit.     ROS Review of Systems  Constitutional:  Negative for activity change and appetite change.  HENT:  Negative for sinus pressure and sore throat.   Respiratory:  Negative for chest tightness, shortness of breath and wheezing.   Cardiovascular:  Negative for chest pain and palpitations.  Gastrointestinal:  Negative for abdominal distention, abdominal pain and constipation.  Genitourinary: Negative.   Musculoskeletal:        See HPI  Psychiatric/Behavioral:  Negative for behavioral problems and dysphoric mood.     Objective:  BP 118/65   Pulse 74   Ht 6' (1.829 m)   Wt 209 lb 12.8 oz (95.2 kg)   SpO2 98%   BMI 28.45 kg/m      11/08/2023   11:07 AM 10/10/2023    2:24 PM 10/10/2023    2:14 PM  BP/Weight  Systolic BP 118 133 97  Diastolic BP 65 82 55  Wt. (Lbs) 209.8    BMI 28.45 kg/m2        Physical Exam Constitutional:      Appearance: He is well-developed.  Cardiovascular:     Rate and Rhythm: Normal rate.      Heart sounds: Normal heart sounds. No murmur heard. Pulmonary:     Effort: Pulmonary effort is normal.     Breath sounds: Normal breath sounds. No wheezing or rales.  Chest:     Chest wall: No tenderness.  Abdominal:     General: Bowel sounds are normal. There is no distension.     Palpations: Abdomen is soft. There is no mass.     Tenderness: There is no abdominal tenderness.  Musculoskeletal:        General: Normal range of motion.     Cervical back: Tenderness (b/l trapezius) present.     Right lower leg: No edema.     Left lower leg: No edema.     Comments: Abduction : Right - 70 degrees Left - 160 degrees with tenderness on ROM Normal handgrip bilaterally  Neurological:     Mental Status: He is alert and oriented to person, place, and time.  Psychiatric:        Mood and Affect: Mood normal.        Latest Ref Rng & Units 03/23/2023    4:17 PM 11/10/2021    8:16 AM 10/29/2021    2:45 AM  CMP  Glucose 70 - 99 mg/dL 82   829   BUN 6 - 24 mg/dL 9   10   Creatinine 9.23 - 1.27 mg/dL 8.86   9.16  Sodium 134 - 144 mmol/L 143   138   Potassium 3.5 - 5.2 mmol/L 3.0   3.7   Chloride 96 - 106 mmol/L 101   108   CO2 20 - 29 mmol/L 30   24   Calcium  8.7 - 10.2 mg/dL 9.4   9.2   Total Protein 6.0 - 8.5 g/dL 7.1  7.0    Total Bilirubin 0.0 - 1.2 mg/dL 0.4  0.3    Alkaline Phos 44 - 121 IU/L 84  76    AST 0 - 40 IU/L 14  13    ALT 0 - 44 IU/L 8  13      Lipid Panel     Component Value Date/Time   CHOL 138 03/23/2023 1617   TRIG 87 03/23/2023 1617   HDL 34 (L) 03/23/2023 1617   CHOLHDL 4.1 03/23/2023 1617   CHOLHDL 6.2 (H) 07/30/2020 1437   VLDL 21 01/26/2017 1526   LDLCALC 87 03/23/2023 1617   LDLCALC 124 (H) 07/30/2020 1437   LDLDIRECT 81 03/23/2023 1617    CBC    Component Value Date/Time   WBC 4.5 03/23/2023 1617   WBC 7.5 10/29/2021 0245   RBC 5.47 03/23/2023 1617   RBC 5.89 (H) 10/29/2021 0245   HGB 13.9 03/23/2023 1617   HCT 42.8 03/23/2023 1617   PLT  194 03/23/2023 1617   MCV 78 (L) 03/23/2023 1617   MCH 25.4 (L) 03/23/2023 1617   MCH 25.3 (L) 10/29/2021 0245   MCHC 32.5 03/23/2023 1617   MCHC 31.5 10/29/2021 0245   RDW 14.5 03/23/2023 1617   LYMPHSABS 2.5 10/29/2021 0245   MONOABS 0.4 10/29/2021 0245   EOSABS 0.2 10/29/2021 0245   EOSABS 0.1 K/UL 08/22/2006 1054   BASOSABS 0.0 10/29/2021 0245    Lab Results  Component Value Date   HGBA1C 5.9 (H) 10/08/2016    Assessment & Plan:      Bilateral Shoulder Pain Chronic pain in right shoulder due to rod placement, new onset of pain in left shoulder for the past two weeks. Limited range of motion in both shoulders, more severe in the right. No neck pain or numbness/tingling. Normal hand grip strength. -Order left shoulder x-ray. -Start physical therapy. -Prescribe Meloxicam  for inflammation. -Prescribe Duloxetine  for chronic pain management. -Consider referral to orthopedics depending on x-ray results and response to physical therapy.  CAD Patient has a history of stent placement and continues to see a cardiologist. -Continue statin -Risk factor modification including smoking cessation is imperative -Continue current management plan.   Hypertension -Controlled -Continue antihypertensive -Counseled on blood pressure goal of less than 130/80, low-sodium, DASH diet, medication compliance, 150 minutes of moderate intensity exercise per week. Discussed medication compliance, adverse effects.   HIV Patient has a history of HIV and continues to see an infectious disease specialist. -Continue current management plan.  Follow-up with Internal Medicine Patient has an upcoming appointment for routine blood work. -Continue current management plan.          Meds ordered this encounter  Medications   DULoxetine  (CYMBALTA ) 60 MG capsule    Sig: Take 1 capsule (60 mg total) by mouth daily. For chronic pain    Dispense:  90 capsule    Refill:  1   meloxicam  (MOBIC ) 7.5 MG  tablet    Sig: Take 1 tablet (7.5 mg total) by mouth daily.    Dispense:  30 tablet    Refill:  1    Follow-up: Return in about 3  months (around 02/06/2024) for f/u shoulder pain.       Corrina Sabin, MD, FAAFP. [facility] [place], [place] [phone]   11/08/2023, 12:06 PM

## 2023-11-08 NOTE — Patient Instructions (Signed)
 VISIT SUMMARY:  You came in today due to severe pain in both shoulders, which has been particularly bad in your left shoulder for the past two weeks. You also mentioned difficulty swallowing and a recent esophageal dilation procedure that did not help. We discussed your chronic right shoulder pain due to a rod that has been in place for almost twenty years. You have no neck pain or numbness/tingling in your arms, and your hand grip strength is normal.  YOUR PLAN:  -BILATERAL SHOULDER PAIN: Bilateral shoulder pain means you are experiencing pain in both shoulders. This can be due to various reasons, including inflammation or chronic conditions. We will start by getting an x-ray of your left shoulder and beginning physical therapy. You have been prescribed Meloxicam  to help with inflammation and Duloxetine  for chronic pain management. Depending on the x-ray results and how you respond to physical therapy, we may refer you to an orthopedic specialist.  -FOLLOW-UP WITH CARDIOLOGY: You have a history of stent placement and are continuing to follow up with your cardiologist. Please continue with your current management plan as advised by your cardiologist.  -FOLLOW-UP WITH INFECTIOUS DISEASE: You have a history of HIV and are continuing to follow up with your infectious disease specialist. Please continue with your current management plan as advised by your specialist.  -FOLLOW-UP WITH INTERNAL MEDICINE: You have an upcoming appointment for routine blood work. Please continue with your current management plan as advised by your internal medicine doctor.  INSTRUCTIONS:  Please follow up with the x-ray of your left shoulder as soon as possible. Begin physical therapy as prescribed. Take Meloxicam  and Duloxetine  as directed. Depending on the results of the x-ray and your progress with physical therapy, we may refer you to an orthopedic specialist. Continue your follow-up appointments with your cardiologist,  infectious disease specialist, and internal medicine doctor as planned.

## 2023-11-22 ENCOUNTER — Encounter: Payer: Medicaid Other | Admitting: Infectious Diseases

## 2023-11-22 ENCOUNTER — Other Ambulatory Visit: Payer: Self-pay

## 2023-11-22 NOTE — Patient Outreach (Signed)
Medicaid Managed Care Social Work Note  11/22/2023 Name:  Benjamin Herrera MRN:  [id number] DOB:  08/04/1966  Benjamin Herrera is an 58 y.o. year old male who is a primary patient of Hoy Register, MD.  The Medicaid Managed Care Coordination team was consulted for assistance with:  Food Insecurity  Mr. Bursch was given information about Medicaid Managed Care Coordination team services today. Benjamin Herrera Patient agreed to services and verbal consent obtained.  Engaged with patient  for by telephone forinitial visit in response to referral for case management and/or care coordination services.   Patient is participating in a Managed Medicaid Plan:  Yes  Assessments/Interventions:  Review of past medical history, allergies, medications, health status, including review of consultants reports, laboratory and other test data, was performed as part of comprehensive evaluation and provision of chronic care management services.  SDOH: (Social Drivers of Health) assessments and interventions performed: SDOH Interventions    Flowsheet Row Office Visit from 11/08/2023 in [facility] Of [place]. [facility] Office Visit from 01[facility]21/2022 in [facility] - A Dept Of [place]. [facility]  SDOH Interventions    Food Insecurity Interventions AMB Referral --  Housing Interventions Intervention Not Indicated --  Transportation Interventions Intervention Not Indicated --  Utilities Interventions Intervention Not Indicated --  Alcohol Usage Interventions Intervention Not Indicated (Score <7) --  Depression Interventions/Treatment  Currently on Treatment Patient refuses Treatment  Financial Strain Interventions Intervention Not Indicated --  Stress Interventions Intervention Not Indicated --  Social Connections Interventions Intervention Not Indicated --  Health Literacy Interventions Intervention Not Indicated --     BSW completed a  telephone outreach with patient, he states he does not need assistance with food, he does receive foodstamps and goes to [employer] for assistance. Patient is requesting PCS for a family member to be paid. BSW explained the difference between PCS and CAP. BSW and patient agreed for BSW to send PCP a message about PCS. No other resources are needed at this time.   Advanced Directives Status:  Not addressed in this encounter.  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) 09/04/2020   Heart palpitations 05/02/2017   Hyperlipidemia LDL goal <70 01/24/2017   Anxiety and depression 11/30/2016   Coronary artery disease involving native coronary artery of native heart with angina pectoris (HCC) 10/28/2016   Primary insomnia 10/28/2016   Bilateral wrist pain 10/28/2016   Polysubstance abuse (HCC)    Unstable angina (HCC)    Chest pain with low risk for cardiac etiology 10/08/2016   Dysphagia    GERD (gastroesophageal reflux disease) 01/10/2015   Bulging lumbar disc 10/07/2014   Syncope and collapse 10/07/2014   Mild sleep apnea 10/07/2014   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   Numbness and tingling in left hand 02/12/2013   Myalgia 02/12/2013   Internal hemorrhoids with bleeding 01/30/2013   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 s/p excision Oct2013 08/03/2012  Condyloma acuminatum in male of scrotum & anal canal s/p laser ablation 07/03/2012   Essential hypertension 08/31/2010   PSORIASIS 11/20/2009   OTHER CANDIDIASIS OF OTHER SPECIFIED SITES 06/26/2009   Marijuana use 05/16/2008   Asthma 05/16/2008   Closed fracture of part  of humerus 09/03/2006    Conditions to be addressed/monitored per PCP order:   PCS  There are no care plans that you recently modified to display for this patient.   Follow up:  Patient agrees to Care Plan and Follow-up.  Plan: The Managed Medicaid care management team will reach out to the patient again over the next 30 days.  Date/time of next scheduled Social Work care management/care coordination outreach:  12/23/23  Gus Puma, Kenard Gower, [facility]  Managed [facility] Social Worker [phone]

## 2023-11-22 NOTE — Patient Instructions (Signed)
Visit Information  Mr. Kneale was given information about Medicaid Managed Care team care coordination services as a part of their Healthy [facility] Medicaid benefit. [name] verbally consented to engagement with the [facility] Managed Care team.   If you are experiencing a medical emergency, please call 911 or report to your local emergency department or urgent care.   If you have a non-emergency medical problem during routine business hours, please contact your provider's office and ask to speak with a nurse.   For questions related to your Healthy [facility] health plan, please call: [phone] or visit the homepage here: [email or web address]  If you would like to schedule transportation through your Healthy [facility] plan, please call the following number at least 2 days in advance of your appointment: [phone]  For information about your ride after you set it up, call Ride Assist at [phone]. Use this number to activate a Will Call pickup, or if your transportation is late for a scheduled pickup. Use this number, too, if you need to make a change or cancel a previously scheduled reservation.  If you need transportation services right away, call [phone]. The after-hours call center is staffed 24 hours to handle ride assistance and urgent reservation requests (including discharges) 365 days a year. Urgent trips include sick visits, hospital discharge requests and life-sustaining treatment.  Call the [facility] Line at [phone], at any time, 24 hours a day, 7 days a week. If you are in danger or need immediate medical attention call 911.  If you would like help to quit smoking, call 1-800-QUIT-NOW ((778)875-8307) OR Espaol: 1-855-Djelo-Ya (4-742-595-6387) o para ms informacin haga clic aqu or Text READY to 564-332 to register via text  Mr. Memoli - following are the goals we discussed in your visit today:   Goals  Addressed   None      Social Worker will follow up in 30 days.   Gus Puma, Kenard Gower, MHA [facility]  Managed Medicaid Social Worker [phone]   Following is a copy of your plan of care:  There are no care plans that you recently modified to display for this patient.

## 2023-11-24 ENCOUNTER — Ambulatory Visit: Payer: Medicaid Other | Admitting: Gastroenterology

## 2023-11-28 ENCOUNTER — Ambulatory Visit: Payer: Medicaid Other | Attending: Family Medicine | Admitting: Physical Therapy

## 2023-11-28 NOTE — Therapy (Incomplete)
OUTPATIENT PHYSICAL THERAPY UPPER EXTREMITY EVALUATION   Patient Name: Benjamin Herrera MRN: [id number] DOB:[birth year], 58 y.o., male Today's Date: 11/28/2023  END OF SESSION:   Past Medical History:  Diagnosis Date   Achalasia and cardiospasm    Allergy    Arthritis    back ,shoulders    Asthma    Boils    under arms hx of   CAD (coronary artery disease)    a. 09/2016: cath showing 100% RCA occlusion with collaterals and 90% OM1 (treated with DES)   Candidiasis of mouth    Candidiasis of unspecified site    Cholelithiasis    Cocaine abuse (HCC)    Quit 09/2016   Condyloma acuminatum in male of scrotum & anal canal s/p laser ablation 07/03/2012   Coronary artery disease    Dysphagia    Fracture, humerus    GERD (gastroesophageal reflux disease)    Heart attack (HCC)    Hemorrhoids    Hepatitis 1993   A   Herpes labialis    HIV (human immunodeficiency virus infection) (HCC) DX 1993   Hypertension Dx 2014   off all bp meds for last 9 or 10 months   Pilonidal disease 01/10/2012   Polysubstance abuse (HCC)    Psoriasis    hx of   Sleep apnea    does  have CPAP   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/03/2012   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 08/03/2012   Past Surgical History:  Procedure Laterality Date   BALLOON DILATION N/A 11/11/2015   Procedure: BALLOON DILATION;  Surgeon: [name], MD;  Location: [facility];  Service: Endoscopy;  Laterality: N/A;   BOTOX INJECTION  10/11/2012   Procedure: BOTOX INJECTION;  Surgeon: [name], MD;  [facility];  Service: Endoscopy;  Laterality: N/A;   BOTOX INJECTION N/A 01/25/2013   Procedure: BOTOX INJECTION;  Surgeon: [name], MD;  [facility];  Service: Endoscopy;  Laterality: N/A;   BOTOX INJECTION N/A 06/21/2013   Procedure: BOTOX INJECTION;  Surgeon: [name], MD;  [facility];  Service: Endoscopy;   Laterality: N/A;   BOTOX INJECTION N/A 10/08/2013   Procedure: BOTOX INJECTION;  Surgeon: [name], MD;  [facility];  Service: Endoscopy;  Laterality: N/A;   BOTOX INJECTION N/A 06/16/2015   Procedure: BOTOX INJECTION;  Surgeon: [name], MD;  [facility];  Service: Endoscopy;  Laterality: N/A;   CARDIAC CATHETERIZATION N/A 10/11/2016   Procedure: Left Heart Cath and Coronary Angiography;  Surgeon: Kathleene Hazel, MD;  mRCA 100% w/ L>R collaterals, OM1 90%   CARDIAC CATHETERIZATION N/A 10/11/2016   Procedure: Coronary Stent Intervention;  Surgeon: Kathleene Hazel, MD;  RESOLUTE ONYX 3.5X15 DES OM1   COLONOSCOPY  05/19/2012   Procedure: COLONOSCOPY;  Surgeon: [name], MD;  [facility];  Service: Endoscopy;  Laterality: N/A;  jill trying to contact pt to come in 0830 for 930 case, phone not accepting messages   COLONOSCOPY     ESOPHAGEAL MANOMETRY  05/29/2012   Procedure: ESOPHAGEAL MANOMETRY (EM);  Surgeon: [name], MD;  [facility];  Service: Endoscopy;  Laterality: N/A;   ESOPHAGEAL MANOMETRY N/A 10/06/2015   Procedure: ESOPHAGEAL MANOMETRY (EM);  Surgeon: [name], MD;  [facility];  Service: Endoscopy;  Laterality: N/A;   ESOPHAGOGASTRODUODENOSCOPY  11/11/2011   Procedure: ESOPHAGOGASTRODUODENOSCOPY (EGD);  Surgeon: [name], MD;  Location: [facility];  Service: Endoscopy;  Laterality: N/A;  botox injection  called Pt to change time of procedure per Dr Arlyce Dice   ESOPHAGOGASTRODUODENOSCOPY  03/10/2012   Procedure: ESOPHAGOGASTRODUODENOSCOPY (EGD);  Surgeon: [name], MD;  Location: [facility];  Service: Endoscopy;  Laterality: N/A;   ESOPHAGOGASTRODUODENOSCOPY  10/11/2012   Procedure: ESOPHAGOGASTRODUODENOSCOPY (EGD);  Surgeon: [name], MD;  Location: [facility];  Service: Endoscopy;  Laterality: N/A;   ESOPHAGOGASTRODUODENOSCOPY N/A 01/25/2013   Procedure:  ESOPHAGOGASTRODUODENOSCOPY (EGD);  Surgeon: [name], MD;  Location: [facility];  Service: Endoscopy;  Laterality: N/A;   ESOPHAGOGASTRODUODENOSCOPY N/A 06/21/2013   Procedure: ESOPHAGOGASTRODUODENOSCOPY (EGD);  Surgeon: [name], MD;  Location: [facility];  Service: Endoscopy;  Laterality: N/A;   ESOPHAGOGASTRODUODENOSCOPY (EGD) WITH PROPOFOL N/A 10/08/2013   Procedure: ESOPHAGOGASTRODUODENOSCOPY (EGD) WITH PROPOFOL;  Surgeon: [name], MD;  [facility];  Service: Endoscopy;  Laterality: N/A;   ESOPHAGOGASTRODUODENOSCOPY (EGD) WITH PROPOFOL N/A 06/16/2015   Procedure: ESOPHAGOGASTRODUODENOSCOPY (EGD) WITH PROPOFOL;  Surgeon: [name], MD;  [facility];  Service: Endoscopy;  Laterality: N/A;   ESOPHAGOGASTRODUODENOSCOPY (EGD) WITH PROPOFOL N/A 11/11/2015   Procedure: ESOPHAGOGASTRODUODENOSCOPY (EGD) WITH PROPOFOL;  Surgeon: [name], MD;  [facility];  Service: Endoscopy;  Laterality: N/A;   EXAMINATION UNDER ANESTHESIA  07/24/2012   Procedure: EXAM UNDER ANESTHESIA;  Surgeon: [name], MD;  [facility];  Service: General;  Laterality: N/A;   humeral fracture surgery Right yrs ago   PILONIDAL CYST EXCISION  07/24/2012   Procedure: CYST EXCISION PILONIDAL SIMPLE;  Surgeon: [name], MD;  [facility];  Service: General;  Laterality: N/A;  Exam Under Anesthesia,, Excision Pilonidal Disease,    right shoulder replacement  2008   x 2   UPPER GASTROINTESTINAL ENDOSCOPY     WART FULGURATION  07/24/2012   Procedure: FULGURATION ANAL WART;  Surgeon: [name], MD;  [facility]: [facility];  Service: General;  Laterality: N/A;  excision of raphe mass   Patient Active Problem List   Diagnosis Date Noted   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   HIV disease (HCC) 02/05/2021   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) 09/04/2020   Heart palpitations 05/02/2017   Hyperlipidemia LDL goal <70 01/24/2017   Anxiety  and depression 11/30/2016   Coronary artery disease involving native coronary artery of native heart with angina pectoris (HCC) 10/28/2016   Primary insomnia 10/28/2016   Bilateral wrist pain 10/28/2016   Polysubstance abuse (HCC)    Unstable angina (HCC)    Chest pain with low risk for cardiac etiology 10/08/2016   Dysphagia    GERD (gastroesophageal reflux disease) 01/10/2015   Bulging lumbar disc 10/07/2014   Syncope and collapse 10/07/2014   Mild sleep apnea 10/07/2014   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   Numbness and tingling in left hand 02/12/2013   Myalgia 02/12/2013   Internal hemorrhoids with bleeding 01/30/2013   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 s/p excision Oct2013 08/03/2012   Condyloma acuminatum in male of scrotum & anal canal s/p laser ablation 07/03/2012   Essential hypertension 08/31/2010   PSORIASIS 11/20/2009   OTHER CANDIDIASIS OF OTHER SPECIFIED SITES 06/26/2009   Marijuana use 05/16/2008   Asthma 05/16/2008   Closed fracture of part of humerus 09/03/2006    PCP: Hoy Register, MD  REFERRING PROVIDER: Hoy Register, MD  REFERRING DIAG: Chronic pain  of both shoulders [M25.511, G89.29, M25.512]   THERAPY DIAG:  No diagnosis found.  Rationale for Evaluation and Treatment: Rehabilitation  ONSET DATE: ***  SUBJECTIVE:                                                                                                                                                                                      SUBJECTIVE STATEMENT: Eval statement 11/28/2023: *** Hand dominance: {MISC; OT HAND DOMINANCE:[phone]}  PERTINENT HISTORY: CAD, HTN, HIV, Kaposi sarcoma (cancer of blood/lymph vessel lining)  PAIN:  Are you having pain? {OPRCPAIN:27236}  PRECAUTIONS: Other: Cancer: Kaposi sarcoma  RED FLAGS: {PT Red Flags:29287}   WEIGHT BEARING RESTRICTIONS: {Yes ***/No:24003}  FALLS:  Has patient fallen in last 6  months? {fallsyesno:27318}  LIVING ENVIRONMENT: Lives with: {OPRC lives with:25569::"lives with their family"} Lives in: {Lives in:25570} Stairs: {opstairs:27293} Has following equipment at home: {Assistive devices:23999}  OCCUPATION: ***  PLOF: {PLOF:24004}  PATIENT GOALS: ***  NEXT MD VISIT: ***  OBJECTIVE:  Note: Objective measures were completed at Evaluation unless otherwise noted.  DIAGNOSTIC FINDINGS:  ***  PATIENT SURVEYS :  {rehab surveys:24030:a}  COGNITION: Overall cognitive status: {cognition:24006}     SENSATION: {sensation:27233}  POSTURE: ***  UPPER EXTREMITY ROM:   {AROM/PROM:27142} ROM Right eval Left eval  Shoulder flexion    Shoulder extension    Shoulder abduction    Shoulder adduction    Shoulder internal rotation    Shoulder external rotation    Elbow flexion    Elbow extension    Wrist flexion    Wrist extension    Wrist ulnar deviation    Wrist radial deviation    Wrist pronation    Wrist supination    (Blank rows = not tested)  UPPER EXTREMITY MMT:  MMT Right eval Left eval  Shoulder flexion    Shoulder extension    Shoulder abduction    Shoulder adduction    Shoulder internal rotation    Shoulder external rotation    Middle trapezius    Lower trapezius    Elbow flexion    Elbow extension    Wrist flexion    Wrist extension    Wrist ulnar deviation    Wrist radial deviation    Wrist pronation    Wrist supination    Grip strength (lbs)    (Blank rows = not tested)  SHOULDER SPECIAL TESTS: Impingement tests: {shoulder impingement test:25231:a} SLAP lesions: {SLAP lesions:25232} Instability tests: {shoulder instability test:25233} Rotator cuff assessment: {rotator cuff assessment:25234} Biceps assessment: {biceps assessment:25235}  JOINT MOBILITY TESTING:  ***  PALPATION:  ***  [facility] Adult PT Treatment:                                                DATE:  11/28/2023  Therapeutic Exercise: *** Manual Therapy: *** Neuromuscular re-ed: *** Therapeutic Activity: *** Modalities: *** Self Care: ***   PATIENT EDUCATION: Education details: Pt received education regarding HEP performance, ADL performance, functional activity tolerance, impairment education, appropriate performance of therapeutic activities. Person educated: {Person educated:25204} Education method: {Education Method:25205} Education comprehension: {Education Comprehension:25206}  HOME EXERCISE PROGRAM: ***  ASSESSMENT:  CLINICAL IMPRESSION: Eval impression (11/28/2023): Pt. attended today's physical therapy session for evaluation of ***. Pt has complaints of ***. Pt has notable deficits with ***.  Signs and symptoms are concurrent with ***. Pt would benefit from therapeutic focus on ***.  Treatment performed today focused on *** Pt demonstrated *** understanding of education provided. required *** cues and *** assistance for appropriate performance with today's activities.  Pt requires the intervention of skilled outpatient physical therapy to address the aforementioned deficits and progress towards a functional level in line with therapeutic goals.    OBJECTIVE IMPAIRMENTS: {opptimpairments:25111}.   ACTIVITY LIMITATIONS: {activitylimitations:27494}  PARTICIPATION LIMITATIONS: {participationrestrictions:25113}  PERSONAL FACTORS: {Personal factors:25162} are also affecting patient's functional outcome.   REHAB POTENTIAL: {rehabpotential:25112}  CLINICAL DECISION MAKING: {clinical decision making:25114}  EVALUATION COMPLEXITY: {Evaluation complexity:25115}  GOALS: Goals reviewed with patient? Yes  SHORT TERM GOALS: Target date: ***  Pt will be independent with administered HEP to demonstrate the competency necessary for long term managemnet of symptoms at home.  Baseline: Goal status: {GOALSTATUS:25110}  2.  *** Baseline:  Goal status:  {GOALSTATUS:25110}  3.  *** Baseline:  Goal status: {GOALSTATUS:25110}  4.  *** Baseline:  Goal status: {GOALSTATUS:25110}  5.  *** Baseline:  Goal status: {GOALSTATUS:25110}  6.  *** Baseline:  Goal status: {GOALSTATUS:25110}  LONG TERM GOALS: Target date: ***  Pt. Will achieve a DASH score of *** as to demonstrate improvement in self-perceived functional ability with daily activities.  Baseline:  Goal status: {GOALSTATUS:25110}  2.  Pt will report pain levels improving during ADLs to be less than or equal to ***/10 as to demonstrate improved tolerance with daily functional activities such as ***.  Baseline:  Goal status: {GOALSTATUS:25110}  3.  Pt will improve MMT score for *** to a ***/5 to demonstrate improvement in strength for quality of motion and activity performance.  Baseline:  Goal status: {GOALSTATUS:25110}  4.  *** Baseline:  Goal status: {GOALSTATUS:25110}  5.  *** Baseline:  Goal status: {GOALSTATUS:25110}  6.  *** Baseline:  Goal status: {GOALSTATUS:25110}  PLAN: PT FREQUENCY: {rehab frequency:25116}  PT DURATION: {rehab duration:25117}  PLANNED INTERVENTIONS: {rehab planned interventions:25118::"97110-Therapeutic exercises","97530- Therapeutic [id number]- Neuromuscular re-education","97535- Self FAOZ","30865- Manual therapy"}  PLAN FOR NEXT SESSION: Review HEP, Begin POC as detailed in assessment   Luis Abed, PT 11/28/2023, 11:08 AM

## 2023-12-15 DIAGNOSIS — G4733 Obstructive sleep apnea (adult) (pediatric): Secondary | ICD-10-CM | POA: Diagnosis not present

## 2023-12-15 DIAGNOSIS — G4710 Hypersomnia, unspecified: Secondary | ICD-10-CM | POA: Diagnosis not present

## 2023-12-15 DIAGNOSIS — I10 Essential (primary) hypertension: Secondary | ICD-10-CM | POA: Diagnosis not present

## 2023-12-23 ENCOUNTER — Other Ambulatory Visit: Payer: Self-pay

## 2023-12-23 NOTE — Patient Instructions (Signed)
 Visit Information  Mr. Cozort was given information about Medicaid Managed Care team care coordination services as a part of their Healthy [facility] Medicaid benefit. [name] verbally consented to engagement with the [facility] Managed Care team.   If you are experiencing a medical emergency, please call 911 or report to your local emergency department or urgent care.   If you have a non-emergency medical problem during routine business hours, please contact your provider's office and ask to speak with a nurse.   For questions related to your Healthy [facility] health plan, please call: [phone] or visit the homepage here: [email or web address]  If you would like to schedule transportation through your Healthy [facility] plan, please call the following number at least 2 days in advance of your appointment: [phone]  For information about your ride after you set it up, call Ride Assist at [phone]. Use this number to activate a Will Call pickup, or if your transportation is late for a scheduled pickup. Use this number, too, if you need to make a change or cancel a previously scheduled reservation.  If you need transportation services right away, call [phone]. The after-hours call center is staffed 24 hours to handle ride assistance and urgent reservation requests (including discharges) 365 days a year. Urgent trips include sick visits, hospital discharge requests and life-sustaining treatment.  Call the [facility] Line at [phone], at any time, 24 hours a day, 7 days a week. If you are in danger or need immediate medical attention call 911.  If you would like help to quit smoking, call 1-800-QUIT-NOW (938-772-4798) OR Espaol: 1-855-Djelo-Ya (7-564-332-9518) o para ms informacin haga clic aqu or Text READY to 841-660 to register via text  Mr. Bredeson - following are the goals we discussed in your visit today:   Goals  Addressed   None      Social Worker will follow up .   Gus Puma, Kenard Gower, MHA [facility]  Managed Medicaid Social Worker [phone]   Following is a copy of your plan of care:  There are no care plans that you recently modified to display for this patient.

## 2023-12-23 NOTE — Patient Outreach (Signed)
 Medicaid Managed Care Social Work Note  12/23/2023 Name:  Benjamin Herrera MRN:  [id number] DOB:  1966-01-05  Benjamin Herrera is an 58 y.o. year old male who is a primary patient of Hoy Register, MD.  The Medicaid Managed Care Coordination team was consulted for assistance with:   PCS   Mr. Ruggiero was given information about Medicaid Managed Care Coordination team services today. Benjamin Herrera Patient agreed to services and verbal consent obtained.  Engaged with patient  for by telephone forfollow up visit in response to referral for case management and/or care coordination services.   Patient is participating in a Managed Medicaid Plan:  Yes  Assessments/Interventions:  Review of past medical history, allergies, medications, health status, including review of consultants reports, laboratory and other test data, was performed as part of comprehensive evaluation and provision of chronic care management services.  SDOH: (Social Drivers of Health) assessments and interventions performed: SDOH Interventions    Flowsheet Row Office Visit from 11/08/2023 in [facility] [place] - A Dept Of [place]. [facility] Office Visit from 01/21/2022 in [facility] - A Dept Of [place]. [facility]  SDOH Interventions    Food Insecurity Interventions AMB Referral --  Housing Interventions Intervention Not Indicated --  Transportation Interventions Intervention Not Indicated --  Utilities Interventions Intervention Not Indicated --  Alcohol Usage Interventions Intervention Not Indicated (Score <7) --  Depression Interventions/Treatment  Currently on Treatment Patient refuses Treatment  Financial Strain Interventions Intervention Not Indicated --  Stress Interventions Intervention Not Indicated --  Social Connections Interventions Intervention Not Indicated --  Health Literacy Interventions Intervention Not Indicated --     BSW completed a telephone  outreach with patient, he states he has not heard anything from PCP regarding PCS. Patient states no other resources are needed at this time. BSW will send a message to PCP.  Advanced Directives Status:  Not addressed in this encounter.  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) 09/04/2020   Heart palpitations 05/02/2017   Hyperlipidemia LDL goal <70 01/24/2017   Anxiety and depression 11/30/2016   Coronary artery disease involving native coronary artery of native heart with angina pectoris (HCC) 10/28/2016   Primary insomnia 10/28/2016   Bilateral wrist pain 10/28/2016   Polysubstance abuse (HCC)    Unstable angina (HCC)    Chest pain with low risk for cardiac etiology 10/08/2016   Dysphagia    GERD (gastroesophageal reflux disease) 01/10/2015   Bulging lumbar disc 10/07/2014   Syncope and collapse 10/07/2014   Mild sleep apnea 10/07/2014   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   Numbness and tingling in left hand 02/12/2013   Myalgia 02/12/2013   Internal hemorrhoids with bleeding 01/30/2013   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 s/p excision Oct2013 08/03/2012   Condyloma acuminatum in male of scrotum & anal canal s/p laser ablation 07/03/2012   Essential hypertension 08/31/2010   PSORIASIS 11/20/2009   OTHER CANDIDIASIS OF OTHER SPECIFIED  SITES 06/26/2009   Marijuana use 05/16/2008   Asthma 05/16/2008   Closed fracture of part of humerus 09/03/2006    Conditions to be addressed/monitored per PCP order:   PCS  There are no care plans that you recently modified to display for this patient.   Follow up:  Patient  agrees to Care Plan and Follow-up.  Plan: The Managed Medicaid care management team will reach out to the patient again over the next 30 days.  Date/time of next scheduled Social Work care management/care coordination outreach:  01/25/24  Gus Puma, Kenard Gower, [facility]  Managed [facility] Social Worker [phone]

## 2023-12-27 ENCOUNTER — Ambulatory Visit: Payer: Self-pay | Admitting: Family Medicine

## 2023-12-27 NOTE — Telephone Encounter (Signed)
 Copied from CRM [id number]. Topic: Clinical - Red Word Triage >> Dec 27, 2023  3:00 PM Archie Patten S wrote: Red Word that prompted transfer to Nurse Triage: Pain in left arm is excruciating. DULoxetine (CYMBALTA) 60 MG capsule is not working. Callback number is [phone]   Chief Complaint: left shoulder pain x 2 months Symptoms: Moderate pain Frequency: intermittent Pertinent Negatives: Patient denies falls or injuries Disposition: [] ED /[] Urgent Care (no appt availability in office) / [x] Appointment(In office/virtual)/ []  [place] [facility]/ [] Home Care/ [] Refused Recommended Disposition /[]  Mobile Bus/ []  Follow-up with PCP Additional Notes: arm pain x 2 months, currently taking Cymbalat, but sts its not helping just making him sleep. Appt scheduled for 3/13  Reason for Disposition  Arm pain is a chronic symptom (recurrent or ongoing AND present > 4 weeks)  Answer Assessment - Initial Assessment Questions 1. ONSET: "When did the pain start?"    2months ago  2. LOCATION: "Where is the pain located?"     Left shoulder to left elbow  3. PAIN: "How bad is the pain?" (Scale 1-10; or mild, moderate, severe)   - MILD (1-3): Doesn't interfere with normal activities.   - MODERATE (4-7): Interferes with normal activities (e.g., work or school) or awakens from sleep.   - SEVERE (8-10): Excruciating pain, unable to do any normal activities, unable to hold a cup of water.     Moderate  4. WORK OR EXERCISE: "Has there been any recent work or exercise that involved this part of the body?"     No  5. CAUSE: "What do you think is causing the arm pain?"     Unsure, possibly arthritis  6. OTHER SYMPTOMS: "Do you have any other symptoms?" (e.g., neck pain, swelling, rash, fever, numbness, weakness)     No  Protocols used: Arm Pain-A-AH

## 2023-12-28 ENCOUNTER — Other Ambulatory Visit (HOSPITAL_BASED_OUTPATIENT_CLINIC_OR_DEPARTMENT_OTHER): Payer: Self-pay | Admitting: Family

## 2023-12-28 DIAGNOSIS — I10 Essential (primary) hypertension: Secondary | ICD-10-CM

## 2023-12-28 DIAGNOSIS — I25118 Atherosclerotic heart disease of native coronary artery with other forms of angina pectoris: Secondary | ICD-10-CM

## 2024-01-02 ENCOUNTER — Ambulatory Visit: Payer: Medicaid Other | Admitting: Physician Assistant

## 2024-01-02 ENCOUNTER — Encounter: Payer: Self-pay | Admitting: Physician Assistant

## 2024-01-02 ENCOUNTER — Other Ambulatory Visit: Payer: Self-pay | Admitting: Physician Assistant

## 2024-01-02 VITALS — BP 112/70 | HR 97 | Ht 72.0 in | Wt 197.0 lb

## 2024-01-02 DIAGNOSIS — I200 Unstable angina: Secondary | ICD-10-CM

## 2024-01-02 DIAGNOSIS — R1319 Other dysphagia: Secondary | ICD-10-CM | POA: Diagnosis not present

## 2024-01-02 DIAGNOSIS — K209 Esophagitis, unspecified without bleeding: Secondary | ICD-10-CM | POA: Diagnosis not present

## 2024-01-02 DIAGNOSIS — B20 Human immunodeficiency virus [HIV] disease: Secondary | ICD-10-CM | POA: Diagnosis not present

## 2024-01-02 DIAGNOSIS — Z1211 Encounter for screening for malignant neoplasm of colon: Secondary | ICD-10-CM

## 2024-01-02 DIAGNOSIS — K224 Dyskinesia of esophagus: Secondary | ICD-10-CM

## 2024-01-02 DIAGNOSIS — R1310 Dysphagia, unspecified: Secondary | ICD-10-CM

## 2024-01-02 MED ORDER — NITROGLYCERIN 0.4 MG SL SUBL: 0.4000 mg | SUBLINGUAL_TABLET | SUBLINGUAL | 1 refills | Status: DC | PRN

## 2024-01-02 NOTE — Progress Notes (Signed)
 01/02/2024 Benjamin Herrera [id number] 1966-03-08  Referring provider: Hoy Register, MD Primary GI doctor: Dr. [name] Paganini  ASSESSMENT AND PLAN:     Significant dysphagia history with previous dilation and Botox last in 2017 recent endoscopy 10/10/2023 with Dr. [name] Paganini showed candidal esophagitis treated with 3 weeks of Diflucan daily status post dilation, grade C esophagitis Patient had esophageal manometry 2016 with incomplete relaxin of the EG junction - completed diflucan, no painful swallowing - having dysphagia still with liquids and solids despite dilation, with weight loss.  -Suspicion for achalasia from EGD status post dilatation which did not appear to help the patient We will plan for esophageal manometry . If confirmed can consider Botox versus surgical referral Can consider as needed sublingual nitroglycerin.  Previous colonoscopy July 2013 completely unremarkable Personal history of anal condyloma with dysplasia Due for repeat colonoscopy Denies blood in stool  History of HIV On medications, follows with infectious disease  History of coronary artery disease  follows with Dr. Rennis Golden   Patient Care Team: Hoy Register, MD as PCP - General (Family Medicine) Ninetta Lights Lacretia Leigh, MD as PCP - Infectious Diseases (Infectious Diseases) Rennis Golden Lisette Abu, MD as PCP - Cardiology (Cardiology) [name], MD (Inactive) as Consulting Physician (Gastroenterology) Karie Soda, MD as Consulting Physician (General Surgery)  HISTORY OF PRESENT ILLNESS: Discussed the use of AI scribe software for clinical note transcription with the patient, who gave verbal consent to proceed.  History of Present Illness   The patient is a 58 year old with esophageal stricture and dysmotility who presents with worsening dysphagia.  He experiences significant difficulty swallowing both solids and liquids, with food and drinks getting 'caught' in his throat. This has led to  involuntary weight loss of about 40 pounds. He also has episodes of waking up choking, which have resulted in blackouts. No chest pain or shortness of breath. He has not undergone the planned screening colonoscopy due to issues with the preparation process. No blood in the stool.  His past medical history includes multiple esophageal dilations and Botox injections, with the last intervention occurring in 2017. An endoscopy on October 10, 2023, revealed LA grade C candidiasis, esophagitis without bleeding, benign esophageal stenosis, and gastritis. He was treated with Diflucan 100 mg PO daily for three weeks, which did not alleviate his swallowing difficulties. He has a history of esophageal manometry showing poor relaxation at the gastroesophageal junction, contributing to his dysphagia.  His current medications include meloxicam, which he takes infrequently due to side effects, and he is not on any reflux medication. He has a history of coronary artery disease post-stent placement in 2017 and a past heart attack, for which he has nitroglycerin available but rarely uses it.  He has a history of smoking, which he acknowledges may exacerbate his esophageal issues.        He  reports that he has been smoking cigarettes. He started smoking about 25 years ago. He has a 18.9 pack-year smoking history. He has never used smokeless tobacco. He reports that he does not currently use drugs after having used the following drugs: Marijuana and Cocaine. Frequency: 3.00 times per week. He reports that he does not drink alcohol.  RELEVANT GI HISTORY, IMAGING AND LABS: Results   LABS H. pylori: negative (10/10/2023)  DIAGNOSTIC Endoscopy: LA grade C esophagitis, esophagitis with no bleeding, benign appearing esophageal stenosis, gastritis biopsy, normal examined duodenum (10/10/2023) Esophageal manometry: abnormal motility, increased pressure at esophagogastric junction  July 2017 EGD - Esophageal  stenosis. Dilated. - Normal stomach. - Normal examined duodenum. - No specimens collected   Jan 2017 EGD Squamocolumnar junction at 42cm, LA gade b erosive esophagitis. mild resistance in passsing the sope from esophagus into the stomach at the GE junction. gastric antral erosions noted, no biopsies were taken from the gastric mucosa to rule out H. pylori. mild duodenitis in the duodenal bulb, second part off the duodenum appeared normal. CRE balloon dilation performed from 18-19-20mm with minimal heme on the ballon    Dec 2016 Esophageal Manometry Incomplete relaxation of the EG junction suggestive of EG junction outflow obstruction   July 2013 colonoscopy -Normal  CBC    Component Value Date/Time   WBC 4.5 03/23/2023 1617   WBC 7.5 10/29/2021 0245   RBC 5.47 03/23/2023 1617   RBC 5.89 (H) 10/29/2021 0245   HGB 13.9 03/23/2023 1617   HCT 42.8 03/23/2023 1617   PLT 194 03/23/2023 1617   MCV 78 (L) 03/23/2023 1617   MCH 25.4 (L) 03/23/2023 1617   MCH 25.3 (L) 10/29/2021 0245   MCHC 32.5 03/23/2023 1617   MCHC 31.5 10/29/2021 0245   RDW 14.5 03/23/2023 1617   LYMPHSABS 2.5 10/29/2021 0245   MONOABS 0.4 10/29/2021 0245   EOSABS 0.2 10/29/2021 0245   EOSABS 0.1 K/UL 08/22/2006 1054   BASOSABS 0.0 10/29/2021 0245   Recent Labs    03/23/23 1617  HGB 13.9    CMP     Component Value Date/Time   NA 143 03/23/2023 1617   K 3.0 (L) 03/23/2023 1617   CL 101 03/23/2023 1617   CO2 30 (H) 03/23/2023 1617   GLUCOSE 82 03/23/2023 1617   GLUCOSE 170 (H) 10/29/2021 0245   BUN 9 03/23/2023 1617   CREATININE 1.13 03/23/2023 1617   CREATININE 0.96 07/30/2020 1437   CALCIUM 9.4 03/23/2023 1617   PROT 7.1 03/23/2023 1617   ALBUMIN 4.2 03/23/2023 1617   AST 14 03/23/2023 1617   ALT 8 03/23/2023 1617   ALKPHOS 84 03/23/2023 1617   BILITOT 0.4 03/23/2023 1617   GFRNONAA >60 10/29/2021 0245   GFRNONAA 74 10/28/2016 1040   GFRAA >60 05/02/2017 0754   GFRAA 85 10/28/2016 1040       Latest Ref Rng & Units 03/23/2023    4:17 PM 11/10/2021    8:16 AM 07/30/2020    2:37 PM  Hepatic Function  Total Protein 6.0 - 8.5 g/dL 7.1  7.0  7.2   Albumin 3.8 - 4.9 g/dL 4.2  4.2    AST 0 - 40 IU/L 14  13  17    ALT 0 - 44 IU/L 8  13  13    Alk Phosphatase 44 - 121 IU/L 84  76    Total Bilirubin 0.0 - 1.2 mg/dL 0.4  0.3  0.3   Bilirubin, Direct 0.00 - 0.40 mg/dL  6.96        Latest Ref Rng & Units 08/15/2008   12:00 AM  Hepatitis C  HCV Quanitative <43 intl units/mL See Comment IU/mL     Current Medications:    Current Outpatient Medications (Cardiovascular):    metoprolol succinate (TOPROL-XL) 25 MG 24 hr tablet, TAKE 1/2 TABLET BY MOUTH DAILY   rosuvastatin (CRESTOR) 40 MG tablet, Take 1 tablet (40 mg total) by mouth daily.   amLODipine (NORVASC) 5 MG tablet, Take 1 tablet (5 mg total) by mouth daily.   nitroGLYCERIN (NITROSTAT) 0.4 MG SL tablet, Place 1 tablet (0.4 mg  total) under the tongue every 5 (five) minutes as needed for chest pain (trouble swallowing).  Current Outpatient Medications (Respiratory):    albuterol (VENTOLIN HFA) 108 (90 Base) MCG/ACT inhaler, Inhale 2 puffs into the lungs every 6 (six) hours as needed for wheezing or shortness of breath.  Current Outpatient Medications (Analgesics):    aspirin 81 MG chewable tablet, Chew 1 tablet (81 mg total) by mouth daily.   meloxicam (MOBIC) 7.5 MG tablet, Take 1 tablet (7.5 mg total) by mouth daily.   Current Outpatient Medications (Other):    BIKTARVY 30-120-15 MG TABS, TAKE 1 TABLET BY MOUTH EVERY DAY   Blood Pressure KIT, 1 kit by Does not apply route daily.   DULoxetine (CYMBALTA) 60 MG capsule, Take 1 capsule (60 mg total) by mouth daily. For chronic pain   hydrocortisone 2.5 % ointment, Apply topically 2 (two) times daily. Apply around rectal area.  Medical History:  Past Medical History:  Diagnosis Date   Achalasia and cardiospasm    Allergy    Arthritis    back ,shoulders    Asthma    Boils     under arms hx of   CAD (coronary artery disease)    a. 09/2016: cath showing 100% RCA occlusion with collaterals and 90% OM1 (treated with DES)   Candidiasis of mouth    Candidiasis of unspecified site    Cholelithiasis    Cocaine abuse (HCC)    Quit 09/2016   Condyloma acuminatum in male of scrotum & anal canal s/p laser ablation 07/03/2012   Coronary artery disease    Dysphagia    Fracture, humerus    GERD (gastroesophageal reflux disease)    Heart attack (HCC)    Hemorrhoids    Hepatitis 1993   A   Herpes labialis    HIV (human immunodeficiency virus infection) (HCC) DX 1993   Hypertension Dx 2014   off all bp meds for last 9 or 10 months   Pilonidal disease 01/10/2012   Polysubstance abuse (HCC)    Psoriasis    hx of   Sleep apnea    does  have CPAP   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/03/2012   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 08/03/2012   Allergies:  Allergies  Allergen Reactions   Citrus Swelling    Mouth itching and swelling      Surgical History:  He  has a past surgical history that includes humeral fracture surgery (Right, yrs ago); right shoulder replacement (2008); Esophagogastroduodenoscopy (11/11/2011); Esophagogastroduodenoscopy (03/10/2012); Colonoscopy (05/19/2012); Esophageal manometry (05/29/2012); Pilonidal cyst excision (07/24/2012); Examination under anesthesia (07/24/2012); Wart fulguration (07/24/2012); Esophagogastroduodenoscopy (10/11/2012); Botox injection (10/11/2012); Esophagogastroduodenoscopy (N/A, 01/25/2013); Botox injection (N/A, 01/25/2013); Esophagogastroduodenoscopy (N/A, 06/21/2013); Botox injection (N/A, 06/21/2013); Esophagogastroduodenoscopy (egd) with propofol (N/A, 10/08/2013); Botox injection (N/A, 10/08/2013); Esophagogastroduodenoscopy (egd) with propofol (N/A, 06/16/2015); Botox injection (N/A, 06/16/2015); Esophageal manometry (N/A, 10/06/2015); Esophagogastroduodenoscopy (egd)  with propofol (N/A, 11/11/2015); Balloon dilation (N/A, 11/11/2015); Upper gastrointestinal endoscopy; Colonoscopy; Cardiac catheterization (N/A, 10/11/2016); and Cardiac catheterization (N/A, 10/11/2016). Family History:  His family history includes Arthritis in his mother; Diabetes in his maternal grandmother; Heart attack in his sister; Heart disease in his mother; Hypertension in his mother; Stomach cancer in his paternal grandmother.  REVIEW OF SYSTEMS  : All other systems reviewed and negative except where noted in the History of Present Illness.  PHYSICAL EXAM: BP 112/70   Pulse 97   Ht 6' (1.829 m)  Wt 197 lb (89.4 kg)   BMI 26.72 kg/m  Physical Exam   GENERAL APPEARANCE: Well nourished, in no apparent distress. HEENT: No cervical lymphadenopathy, unremarkable thyroid, sclerae anicteric, conjunctiva pink. RESPIRATORY: Respiratory effort normal, breath sounds equal bilaterally without rales, rhonchi, or wheezing. CARDIO: Regular rate and rhythm with no murmurs, rubs, or gallops, peripheral pulses intact. ABDOMEN: Soft, non-distended, active bowel sounds in all four quadrants, no tenderness to palpation, no rebound, no mass appreciated. RECTAL: Declines. MUSCULOSKELETAL: Full range of motion, normal gait, without edema. SKIN: Dry, intact without rashes or lesions. No jaundice. NEURO: Alert, oriented, no focal deficits. PSYCH: Cooperative, normal mood and affect.      Doree Albee, PA-C 4:11 PM

## 2024-01-02 NOTE — Patient Instructions (Signed)
 You have been scheduled for an Esophageal Manometry at [facility] on 05/23/24 at 10:30 am. Please arrive 30 minutes prior to your procedure for registration. You will need to go to outpatient registration (1st floor of the hospital) first. Make certain to bring your insurance cards as well as a complete list of medications.  Please remember the following:  1) Do not take any muscle relaxants, xanax (alprazolam) or ativan for 1 day prior to your test as well as the day of the test.  2) Nothing to eat or drink after 12:00 midnight on the night before your test.  3) Hold all diabetic medications/insulin the morning of the test. You may eat and take your medications after the test.  4) For 7 days prior to your test, do not take: Reglan, Tagamet, Zantac, Phenergan, Axid or Pepcid.  5) You MAY use an antacid such as Rolaids or Tums up to 12 hours prior to your test.  ------------------------------------------------------------------------------------------- ABOUT ESOPHAGEAL MANOMETRY Esophageal manometry (muh-NOM-uh-tree) is a test that gauges how well your esophagus works. Your esophagus is the long, muscular tube that connects your throat to your stomach. Esophageal manometry measures the rhythmic muscle contractions (peristalsis) that occur in your esophagus when you swallow. Esophageal manometry also measures the coordination and force exerted by the muscles of your esophagus.  During esophageal manometry, a thin, flexible tube (catheter) that contains sensors is passed through your nose, down your esophagus and into your stomach. Esophageal manometry can be helpful in diagnosing some mostly uncommon disorders that affect your esophagus.  Why it's done Esophageal manometry is used to evaluate the movement (motility) of food through the esophagus and into the stomach. The test measures how well the circular bands of muscle (sphincters) at the top and bottom of your esophagus open and  close, as well as the pressure, strength and pattern of the wave of esophageal muscle contractions that moves food along.  What you can expect Esophageal manometry is an outpatient procedure done without sedation. Most people tolerate it well. You may be asked to change into a hospital gown before the test starts.  During esophageal manometry  While you are sitting up, a member of your health care team sprays your throat with a numbing medication or puts numbing gel in your nose or both.  A catheter is guided through your nose into your esophagus. The catheter may be sheathed in a water-filled sleeve. It doesn't interfere with your breathing. However, your eyes may water, and you may gag. You may have a slight nosebleed from irritation.  After the catheter is in place, you may be asked to lie on your back on an exam table, or you may be asked to remain seated.  You then swallow small sips of water. As you do, a computer connected to the catheter records the pressure, strength and pattern of your esophageal muscle contractions.  During the test, you'll be asked to breathe slowly and smoothly, remain as still as possible, and swallow only when you're asked to do so.  A member of your health care team may move the catheter down into your stomach while the catheter continues its measurements.  The catheter then is slowly withdrawn.  This test typically takes 30-45 minutes to complete.  ---------------------------------------------------------------------------------------------- ABOUT 24 HOUR PH PROBE An esophageal pH test measures and records the pH in your esophagus to determine if you have gastroesophageal reflux disease (GERD). The test can also be done to determine the effectiveness of medications  or surgical treatment for GERD. What is esophageal reflux? Esophageal reflux is a condition in which stomach acid refluxes or moves back into the esophagus (the "food pipe" leading from the mouth to the  stomach). How does the esophageal pH test work? A thin, small tube with an acid sensing device on the tip is gently passed through your nose, down the esophagus ("food tube"), and positioned about 2 inches above the lower esophageal sphincter. The tube is secured to the side of your face with clear tape. The end of the tube exiting from your nose is attached to a portable recorder that is worn on your belt or over your shoulder. The recorder has several buttons on it that you will press to mark certain events. A nurse will review the monitoring instructions with you. Once the test has begun, what do I need to know and do? Activity: Follow your usual daily routine. Do not reduce or change your activities during the monitoring period. Doing so can make the monitoring results less useful.  Note: do not take a tub bath or shower; the equipment can't get wet.  Eating: Eat your regular meals at the usual times. If you do not eat during the monitoring period, your stomach will not produce acid as usual, and the test results will not be accurate. Eat at least 2 meals a day. Eat foods that tend to increase your symptoms (without making yourself miserable). Avoid snacking. Do not suck on hard candy or lozenges and do not chew gum during the monitoring period.  Lying down: Remain upright throughout the day. Do not lie down until you go to bed (unless napping or lying down during the day is part of your daily routine).  Medications: Continue to follow your doctor's advice regarding medications to avoid during the monitoring period.  Recording symptoms: Press the appropriate button on your recorder when symptoms occur (as discussed with the nurse).  Recording events: Record the time you start and stop eating and drinking (anything other than plain water). Record the time you lie down (even if just resting) and when you get back up. The nurse will explain this.  Unusual symptoms or side effects. If you think you may be  experiencing any unusual symptoms or side effects, call your doctor.  You will return the next day to have the tube removed. The information on the recorder will be downloaded to a computer and the results will be analyzed.  After completion of the study Resume your normal diet and medications. Lozenges or hard candy may help ease any sore throat caused by the tube.   It will take at least 2 weeks to receive the results of this test from your physician. Behavioral and Dietary Strategies for Management of Esophageal Dysmotility/dysphagia 1. Take reflux medications 30+ minutes before food in the morning, protonix 40 mg daily 2. Begin meals with warm beverage 3. Eat smaller more frequent meals 4. Eat slowly, taking small bites and sips 5. Alternate solids and liquids 6. Avoid foods/liquids that increase acid production 7. Sit upright during and for 30+ minutes after meals to facilitate esophageal clearing 8. Can try altoid melting in mouth before food  Nitroglycerin can cause mild light-headedness or a brief headache may occur.  Other side effects include lightheadedness, flushing, dizziness, nervousness, nausea, and vomiting. If any of these side effects persist or worsen, notify us promptly. Stop using the NTG and notify us immediately if you develop the rare side effects of severe dizziness, fainting,  fast/pounding heartbeat, paleness, sweating, blurred vision, dry mouth, dark urine, bluish lips/skin/nails, unusual tiredness, severe weakness, irregular heartbeat, seizures, or chest pain. Serious allergic reactions are unusual, but seek immediate medical attention if you develop a rash, swelling, dizziness, or trouble breathing.  Tell us if you are allergic to nitrates, have severe anemia, low blood pressure, dehydration, chronic heart failure, cardiomyopathy, recent heart attack, increased pressure in the brain, or exposure to nitrates while on the job. Do not use NTG while driving or working  around machinery if you are drowsy, dizzy, have lightheadedness, or blurred vision. Limit alcoholic beverages. To minimize dizziness and lightheadedness, get up slowly when rising from a sitting or lying position. The elderly may be more prone to dizziness and falling. While there are not adequate studies to confirm the safety of NTG in pregnant or breast feeding women, it has been used without incident so far. We recommend waiting at least one hour after applying the NTG ointment before breast feeding.  Store the NTG at room temperature and keep away from light and moisture. Close the container tightly after each use. Do not store in the bathroom. Keep away from children and pets.   Achalasia  Your esophagus is the part of your body that moves food from your mouth to your stomach. When you swallow, muscles in a part of your esophagus called the lower esophageal sphincter (LES) relax to let food into your stomach.  If the muscles don't relax like they should, you may have a condition called achalasia. This can cause there to be more pressure near the LES. It can also make it hard for you to swallow. What are the causes? The cause of this condition is not known. What increases the risk? You may be more likely to get achalasia if: You're 42-48 years old. Someone else in your family has the condition. You have a disease that affects your body's defense system (immune system). This includes: Diabetes. Hypothyroidism. Sjgren's syndrome. Systemic lupus erythematosus (SLE). What are the signs or symptoms? Symptoms may include: Trouble swallowing or pain when you swallow. Coughing. Making high-pitched whistling sounds when you breathe, most often when you breathe out. This is also called wheezing. Heartburn or trouble burping. Chest pain. Vomiting. Weight loss. Bad breath. How is this diagnosed? Achalasia may be diagnosed based on your symptoms, your medical history, and an exam. You may also  need tests. These may include: X-rays. A barium swallow test. During this test, you swallow a chalky liquid that makes your esophagus easier to see. Then X-rays are taken. An endoscopy. This is when a tube called an endoscope is used to check your esophagus. Esophageal manometry. This checks if there's more pressure on your LES. How is this treated? There's no cure. But treatment can help with symptoms. It may include: Medicines. An endoscopy procedure. A balloon may be used to stretch your esophagus. Surgery. This may involve cutting the muscles that won't relax. A shot of botulinum toxin. This helps your muscles relax. Follow these instructions at home: Eating and drinking Try to eat soft foods that are easy to swallow. When you eat: Sit upright. Cut your food into small pieces. Eat slowly. General instructions Take over-the-counter and prescription medicines only as told by your health care provider. Do not use any products that contain nicotine or tobacco. These products include cigarettes, chewing tobacco, and vaping devices, such as e-cigarettes. If you need help quitting, ask your provider. Contact a health care provider if: Your symptoms don't  go away with treatment. You have new symptoms. You have a fever. Your pain gets worse. Your belly swells. You vomit every time you eat or drink. Get help right away if: You vomit blood. You have chest pain. You have trouble breathing. These symptoms may be an emergency. Get help right away. Call 911. Do not wait to see if the symptoms will go away. Do not drive yourself to the hospital. This information is not intended to replace advice given to you by your health care provider. Make sure you discuss any questions you have with your health care provider. Document Revised: 06/17/2023 Document Reviewed: 01/10/2023 Elsevier Patient Education  2024 [employer].

## 2024-01-04 ENCOUNTER — Telehealth: Payer: Self-pay

## 2024-01-04 DIAGNOSIS — Z1211 Encounter for screening for malignant neoplasm of colon: Secondary | ICD-10-CM

## 2024-01-04 MED ORDER — SUFLAVE 178.7 G PO SOLR: 1.0000 | Freq: Once | ORAL | 0 refills | Status: AC

## 2024-01-04 NOTE — Telephone Encounter (Signed)
 Called and spoke with patient regarding recommendations as outlined by [place], [place]. Pt has been scheduled for screening colonoscopy in the [facility] with Dr. [name] Paganini on Tuesday, 01/31/24 at 3 pm. Patient has been advised to expect a text/call from [facility] to set up shipment/payment of his prep. Patient is doing a 2-day prep and has been advised that he will need to purchase OTC supplies (Miralax, Dulcolax, and Gatorade) to complete first part of prep. Patient confirmed access to MyChart, he will received instructions via MyChart today and a hard copy in the mail over the next couple of weeks. Patient verbalized understanding and had no concerns at the end of the call.  Ambulatory referral to GI. [facility] RX sent to [facility]. 2-day prep colonoscopy instructions mailed and sent to patient via MyChart.

## 2024-01-04 NOTE — Telephone Encounter (Signed)
-----   Message from Doree Albee sent at 01/04/2024  7:39 AM EDT ----- Regarding: FW: Botox? Referral? Please schedule for 2 day colonoscopy with Dr. [name] Paganini for screening purposes. She does not recommend botox at this time, continue with nitroglycerin as needed for swallowing, chewing food well, starting on the protonix given and Dr. [name] Paganini will have further recommendations after the esophageal manometry scheduled in July.  Thanks, Marchelle Folks ----- Message ----- From: [name], MD Sent: 01/03/2024   2:46 PM EDT To: Doree Albee, PA-C Subject: RE: Botox? Referral?                           Agree with esophageal manometry and proceeding with colonoscopy, he will need likely 2-day bowel prep.  Do not recommend repeat EGD with Botox, there is minimal benefit.  Even for achalasia the current recommendation is to avoid Botox unless the patient is elderly and is not a surgical candidate.  Based on mano, we can decide further management of dysphagia ----- Message ----- From: Doree Albee, PA-C Sent: 01/02/2024   4:15 PM EDT To: [name], MD Subject: Botox? Referral?                               Well-known to 59 year old male with history of HIV, coronary artery disease not on blood thinner had recent EGD with dilation for dysphagia with solids and liquids which did not improve his symptoms.  He was treated with Diflucan for esophageal candidiasis denies odynophagia but continues to have dysphagia with liquids and solids with weight loss.  Had esophageal manometry 2016 that did show abnormal relaxation of GE junction has had Botox in the past with help.  I am repeating the manometry I did give him nitroglycerin which she has had in the past for his coronary artery disease given information started him on Protonix for possible reflux dysmotility. Is a something where he would like to do Botox in the hospital or does he need a referral to a tertiary center for evaluation for  potential procedure if this is achalasia? Also patient is due for colonoscopy this was not done with the endoscopy I think to not delay it would you be able to do the colon with the Botox EGD or should I just set him up for screening colonoscopy is not having any symptoms?  Thanks

## 2024-01-05 ENCOUNTER — Ambulatory Visit: Attending: Physician Assistant | Admitting: Physician Assistant

## 2024-01-05 ENCOUNTER — Encounter: Payer: Self-pay | Admitting: Physician Assistant

## 2024-01-05 VITALS — BP 106/58 | HR 82 | Ht 72.0 in | Wt 196.2 lb

## 2024-01-05 DIAGNOSIS — E876 Hypokalemia: Secondary | ICD-10-CM

## 2024-01-05 DIAGNOSIS — M7502 Adhesive capsulitis of left shoulder: Secondary | ICD-10-CM | POA: Diagnosis not present

## 2024-01-05 DIAGNOSIS — I10 Essential (primary) hypertension: Secondary | ICD-10-CM

## 2024-01-05 MED ORDER — DICLOFENAC SODIUM 1 % EX GEL: 2.0000 g | Freq: Four times a day (QID) | CUTANEOUS | 3 refills | Status: AC

## 2024-01-05 MED ORDER — METHOCARBAMOL 500 MG PO TABS: 1000.0000 mg | ORAL_TABLET | Freq: Three times a day (TID) | ORAL | 1 refills | Status: AC | PRN

## 2024-01-05 NOTE — Patient Instructions (Addendum)
 Please drink 80 ounces water daily  Adhesive Capsulitis  Adhesive capsulitis, also called frozen shoulder, causes the shoulder to become stiff and painful to move. This condition happens when there is inflammation of the tendons and ligaments that surround the shoulder joint (shoulder capsule). Tendons are tissues that connect muscle to bone. Ligaments are tissues that connect bones to each other. What are the causes? This condition may be caused by: An injury to your shoulder joint. Straining your shoulder. Not moving your shoulder for a period of time. This can happen if your arm was injured or in a sling. In some cases, the cause is not known. What increases the risk? You are more likely to develop this condition if: You are male. You are older than 58 years of age. You have certain other conditions, such as: Diabetes. Thyroid problems. Stroke. You recently had surgery, especially shoulder or neck surgery. What are the signs or symptoms? Symptoms of this condition include: Pain in your shoulder when you move your arm. There may also be pain when parts of your shoulder are touched. The pain may be worse at night or when you are resting. Not being able to move your shoulder normally. Sudden muscle tightening (muscle spasms). How is this diagnosed? This condition is diagnosed with a physical exam and imaging tests, such as an X-ray or MRI. How is this treated? This condition may be treated with: Treatment of the injury or condition that caused the adhesive capsulitis. Medicines for pain, inflammation, or muscle spasms. Injections of medicine (steroids) into the shoulder joint. Physical therapy. This involves doing exercises to get the shoulder moving again. Shoulder manipulation. This is a procedure to move the shoulder into another position. It is done after you are given a medicine to make you fall asleep (general anesthesia). The joint may also be injected with salt water at  high pressure to break down scarring. Surgery. This may be done in severe cases when other treatments do not work. Some less common treatments include: Injection of hyaluronic acid into the shoulder joint. This substance is a normal part of the fluid inside joints. It helps lubricate the joint and can lower inflammation. Injection of platelet-rich plasma into the shoulder joint. This uses a type of blood cell from your own blood that may speed up the healing process. Sending energy waves to the affected area (extracorporeal shock wave therapy). Most people recover completely from adhesive capsulitis, but some may not get back full shoulder movement. Follow these instructions at home: Managing pain, stiffness, and swelling     If told, put ice on the injured area. Put ice in a plastic bag. Place a towel between your skin and the bag. Leave the ice on for 20 minutes, 2-3 times a day. If told, apply heat to the affected area before you exercise. Use the heat source that your health care provider recommends, such as a moist heat pack or a heating pad. Place a towel between your skin and the heat source. Leave the heat on for 20-30 minutes. If your skin turns bright red, remove the ice or heat right away to prevent skin damage. The risk of damage is higher if you cannot feel pain, heat, or cold. General instructions Take over-the-counter and prescription medicines only as told by your provider. If you are being treated with physical therapy, do exercises as told. Avoid activities or exercises that put a lot of demand on your shoulder, such as throwing. These can make pain worse. Contact  a health care provider if: You have new symptoms. Your symptoms get worse. This information is not intended to replace advice given to you by your health care provider. Make sure you discuss any questions you have with your health care provider. Document Revised: 07/26/2022 Document Reviewed:  07/26/2022 Elsevier Patient Education  2024 [employer].

## 2024-01-05 NOTE — Progress Notes (Signed)
 Patient ID: Benjamin Herrera, male   DOB: 1966-05-03, 58 y.o.   MRN: [id number]   Eutimio Gharibian, is a 58 y.o. male  UVO:[id number]  [id number]  DOB - 1966/09/14  Chief Complaint  Patient presents with   Arm Pain       Subjective:   Benjamin Herrera is a 58 y.o. male here today for recheck L shoulder.  He was seen in January for shoulder pain and started on cymbalta and given meloxicam.  He is undergoing a workup currently for dysphagia and has colonoscopy scheduled in about 2 weeks(some history from chart/some from patient).  His shoulder pain has worsened and he is barely able to use his L arm for anything.  He did not f=go to PT as recommended by Dr Alvis Lemmings bc he felt it wouldn't help.  NKI.  No CP/SOB  No problems updated.  ALLERGIES: Allergies  Allergen Reactions   Citrus Swelling    Mouth itching and swelling     PAST MEDICAL HISTORY: Past Medical History:  Diagnosis Date   Achalasia and cardiospasm    Allergy    Arthritis    back ,shoulders    Asthma    Boils    under arms hx of   CAD (coronary artery disease)    a. 09/2016: cath showing 100% RCA occlusion with collaterals and 90% OM1 (treated with DES)   Candidiasis of mouth    Candidiasis of unspecified site    Cholelithiasis    Cocaine abuse (HCC)    Quit 09/2016   Condyloma acuminatum in male of scrotum & anal canal s/p laser ablation 07/03/2012   Coronary artery disease    Dysphagia    Fracture, humerus    GERD (gastroesophageal reflux disease)    Heart attack (HCC)    Hemorrhoids    Hepatitis 1993   A   Herpes labialis    HIV (human immunodeficiency virus infection) (HCC) DX 1993   Hypertension Dx 2014   off all bp meds for last 9 or 10 months   Pilonidal disease 01/10/2012   Polysubstance abuse (HCC)    Psoriasis    hx of   Sleep apnea    does  have CPAP   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 Authorizing Provider  albuterol (VENTOLIN HFA) 108 (90 Base) MCG/ACT inhaler Inhale 2 puffs into the lungs every 6 (six) hours as needed for wheezing or shortness of breath. 04/07/21  Yes [name], MD  aspirin 81 MG chewable tablet Chew 1 tablet (81 mg total) by mouth daily. 10/28/16  Yes Dessa Phi, MD  BIKTARVY 30-120-15 MG TABS TAKE 1 TABLET BY MOUTH EVERY DAY 09/29/23  Yes [name], MD  Blood Pressure KIT 1 kit by Does not apply route daily. 03/23/23  Yes Alver Sorrow, NP  diclofenac Sodium (VOLTAREN ARTHRITIS PAIN) 1 % GEL Apply 2 g topically 4 (four) times daily. 01/05/24  Yes Anders Simmonds, PA-C  DULoxetine (CYMBALTA) 60 MG capsule Take 1 capsule (60 mg total) by mouth daily. For chronic pain 11/08/23  Yes Hoy Register, MD  hydrocortisone 2.5 % ointment Apply topically 2 (two) times daily. Apply around rectal area. 02/06/21  Yes Pham, Minh Q, RPH-CPP  methocarbamol (ROBAXIN) 500 MG tablet Take 2  tablets (1,000 mg total) by mouth every 8 (eight) hours as needed. 01/05/24  Yes Georgian Co M, PA-C  metoprolol succinate (TOPROL-XL) 25 MG 24 hr tablet TAKE 1/2 TABLET BY MOUTH DAILY 12/28/23  Yes Alver Sorrow, NP  nitroGLYCERIN (NITROSTAT) 0.4 MG SL tablet Place 1 tablet (0.4 mg total) under the tongue every 5 (five) minutes as needed for chest pain (trouble swallowing). 01/02/24  Yes Quentin Mulling R, PA-C  rosuvastatin (CRESTOR) 40 MG tablet Take 1 tablet (40 mg total) by mouth daily. 03/23/23  Yes Alver Sorrow, NP  amLODipine (NORVASC) 5 MG tablet Take 1 tablet (5 mg total) by mouth daily. 03/23/23 10/10/23  Alver Sorrow, NP    ROS: Neg HEENT Neg resp Neg cardiac Neg GI Neg GU Neg psych Neg neuro  Objective:   Vitals:   01/05/24 1347  BP: (!) 106/58  Pulse: 82  SpO2: 100%  Weight: 196 lb 3.2 oz (89 kg)  Height: 6'  (1.829 m)   Exam General appearance : Awake, alert, not in any distress. Speech Clear but mumbles some. Not toxic looking.  Poor eye contact.  Poor historian.   HEENT: Atraumatic and Normocephalic.  Mucus membranes are DRY! Neck: Supple, no JVD. No cervical lymphadenopathy.  Chest: Good air entry bilaterally, CTAB.  No rales/rhonchi/wheezing CVS: S1 S2 regular, no murmurs.  Extremities: B/L Lower Ext shows no edema, both legs are warm to touch.  BUE-he does not want me to touch his arm at all.  There is no erythema or obvious effusion.  He will flex and extend from elbow.  He will not move/cannot move in any direction from shoulder.   Neurology: Awake alert, and oriented X 3, CN II-XII intact, Non focal Skin: No Rash  Data Review Lab Results  Component Value Date   HGBA1C 5.9 (H) 10/08/2016   HGBA1C 5.9 (H) 12/27/2011    Assessment & Plan   1. Adhesive capsulitis of left shoulder (Primary) Unable to give adequate exam.  Topical NSAIDS since colonoscopy coming up... - Ambulatory referral to Orthopedic Surgery - methocarbamol (ROBAXIN) 500 MG tablet; Take 2 tablets (1,000 mg total) by mouth every 8 (eight) hours as needed.  Dispense: 90 tablet; Refill: 1 - diclofenac Sodium (VOLTAREN ARTHRITIS PAIN) 1 % GEL; Apply 2 g topically 4 (four) times daily.  Dispense: 100 g; Refill: 3  2. Essential hypertension Controlled.  Increase water intake(appears dehydrated.) - Basic Metabolic Panel  3. Hypokalemia - Basic Metabolic Panel    Return in about 3 months (around 04/06/2024) for PCP for chronic conditions-[place].  The patient was given clear instructions to go to ER or return to medical center if symptoms don't improve, worsen or new problems develop. The patient verbalized understanding. The patient was told to call to get lab results if they haven't heard anything in the next week.      Georgian Co, PA-C [facility] [place],  [place] [phone]   01/05/2024, 2:16 PM

## 2024-01-06 ENCOUNTER — Encounter: Payer: Self-pay | Admitting: Physician Assistant

## 2024-01-06 LAB — BASIC METABOLIC PANEL
BUN/Creatinine Ratio: 16 (ref 9–20)
BUN: 16 mg/dL (ref 6–24)
CO2: 23 mmol/L (ref 20–29)
Calcium: 9 mg/dL (ref 8.7–10.2)
Chloride: 103 mmol/L (ref 96–106)
Creatinine, Ser: 1 mg/dL (ref 0.76–1.27)
Glucose: 64 mg/dL — ABNORMAL LOW (ref 70–99)
Potassium: 3.7 mmol/L (ref 3.5–5.2)
Sodium: 139 mmol/L (ref 134–144)
eGFR: 88 mL/min/{1.73_m2} (ref >59)

## 2024-01-13 ENCOUNTER — Ambulatory Visit (HOSPITAL_BASED_OUTPATIENT_CLINIC_OR_DEPARTMENT_OTHER): Admitting: Orthopaedic Surgery

## 2024-01-13 DIAGNOSIS — M7502 Adhesive capsulitis of left shoulder: Secondary | ICD-10-CM

## 2024-01-13 DIAGNOSIS — M25512 Pain in left shoulder: Secondary | ICD-10-CM | POA: Diagnosis not present

## 2024-01-13 MED ORDER — LIDOCAINE HCL 1 % IJ SOLN
4.0000 mL | INTRAMUSCULAR | Status: AC | PRN
  Administered 2024-01-13: 4 mL

## 2024-01-13 MED ORDER — TRIAMCINOLONE ACETONIDE 40 MG/ML IJ SUSP
80.0000 mg | INTRAMUSCULAR | Status: AC | PRN
Start: 2024-01-13 — End: 2024-01-13
  Administered 2024-01-13: 80 mg via INTRA_ARTICULAR

## 2024-01-13 NOTE — Progress Notes (Signed)
 Chief Complaint: Left shoulder pain     History of Present Illness:    Benjamin Herrera is a 58 y.o. male presents today with ongoing left shoulder pain.  He states that he woke up 1 day with significant pain and inability to move the shoulder.  Denies any history of thyroid disease or diabetes.  The pain is located the anterior posterior aspect of the shoulder.  He cannot lay on the shoulder    PMH/PSH/Family History/Social History/Meds/Allergies:    Past Medical History:  Diagnosis Date  . Achalasia and cardiospasm   . Allergy   . Arthritis    back ,shoulders   . Asthma   . Boils    under arms hx of  . CAD (coronary artery disease)    a. 09/2016: cath showing 100% RCA occlusion with collaterals and 90% OM1 (treated with DES)  . Candidiasis of mouth   . Candidiasis of unspecified site   . Cholelithiasis   . Cocaine abuse (HCC)    Quit 09/2016  . Condyloma acuminatum in male of scrotum & anal canal s/p laser ablation 07/03/2012  . Coronary artery disease   . Dysphagia   . Fracture, humerus   . GERD (gastroesophageal reflux disease)   . Heart attack (HCC)   . Hemorrhoids   . Hepatitis 1993   A  . Herpes labialis   . HIV (human immunodeficiency virus infection) (HCC) DX 1993  . Hypertension Dx 2014   off all bp meds for last 9 or 10 months  . Pilonidal disease 01/10/2012  . Polysubstance abuse (HCC)   . Psoriasis    hx of  . Sleep apnea    does  have CPAP  . 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 BALLOON DILATION N/A 11/11/2015   Procedure: BALLOON DILATION;  Surgeon: [name], MD;  Location: [facility];  Service: Endoscopy;  Laterality: N/A;  . BOTOX INJECTION  10/11/2012   Procedure: BOTOX INJECTION;  Surgeon: [name], MD;  Location: [facility];  Service: Endoscopy;   Laterality: N/A;  . BOTOX INJECTION N/A 01/25/2013   Procedure: BOTOX INJECTION;  Surgeon: [name], MD;  Location: [facility];  Service: Endoscopy;  Laterality: N/A;  . BOTOX INJECTION N/A 06/21/2013   Procedure: BOTOX INJECTION;  Surgeon: [name], MD;  Location: [facility];  Service: Endoscopy;  Laterality: N/A;  . BOTOX INJECTION N/A 10/08/2013   Procedure: BOTOX INJECTION;  Surgeon: [name], MD;  Location: [facility];  Service: Endoscopy;  Laterality: N/A;  . BOTOX INJECTION N/A 06/16/2015   Procedure: BOTOX INJECTION;  Surgeon: [name], MD;  Location: [facility];  Service: Endoscopy;  Laterality: N/A;  . CARDIAC CATHETERIZATION N/A 10/11/2016   Procedure: Left Heart Cath and Coronary Angiography;  Surgeon: Kathleene Hazel, MD;  mRCA 100% w/ L>R collaterals, OM1 90%  . CARDIAC CATHETERIZATION N/A 10/11/2016   Procedure: Coronary Stent Intervention;  Surgeon: Kathleene Hazel, MD;  Toma Aran 1.6X09 DES OM1  . COLONOSCOPY  05/19/2012   Procedure: COLONOSCOPY;  Surgeon: [name], MD;  Location: [facility];  Service: Endoscopy;  Laterality: N/A;  jill trying to contact pt to come in 0830 for 930 case, phone not accepting messages  . COLONOSCOPY    . ESOPHAGEAL MANOMETRY  05/29/2012  Procedure: ESOPHAGEAL MANOMETRY (EM);  Surgeon: [name], MD;  [facility]: [facility];  Service: Endoscopy;  Laterality: N/A;  . ESOPHAGEAL MANOMETRY N/A 10/06/2015   Procedure: ESOPHAGEAL MANOMETRY (EM);  Surgeon: [name], MD;  Location: [facility];  Service: Endoscopy;  Laterality: N/A;  . ESOPHAGOGASTRODUODENOSCOPY  11/11/2011   Procedure: ESOPHAGOGASTRODUODENOSCOPY (EGD);  Surgeon: [name], MD;  Location: [facility];  Service: Endoscopy;  Laterality: N/A;  botox injection  called Pt to change time of procedure per Dr Arlyce Dice  . ESOPHAGOGASTRODUODENOSCOPY  03/10/2012   Procedure: ESOPHAGOGASTRODUODENOSCOPY (EGD);  Surgeon:  [name], MD;  Location: [facility];  Service: Endoscopy;  Laterality: N/A;  . ESOPHAGOGASTRODUODENOSCOPY  10/11/2012   Procedure: ESOPHAGOGASTRODUODENOSCOPY (EGD);  Surgeon: [name], MD;  Location: [facility];  Service: Endoscopy;  Laterality: N/A;  . ESOPHAGOGASTRODUODENOSCOPY N/A 01/25/2013   Procedure: ESOPHAGOGASTRODUODENOSCOPY (EGD);  Surgeon: [name], MD;  Location: [facility];  Service: Endoscopy;  Laterality: N/A;  . ESOPHAGOGASTRODUODENOSCOPY N/A 06/21/2013   Procedure: ESOPHAGOGASTRODUODENOSCOPY (EGD);  Surgeon: [name], MD;  Location: [facility];  Service: Endoscopy;  Laterality: N/A;  . ESOPHAGOGASTRODUODENOSCOPY (EGD) WITH PROPOFOL N/A 10/08/2013   Procedure: ESOPHAGOGASTRODUODENOSCOPY (EGD) WITH PROPOFOL;  Surgeon: [name], MD;  [facility]: [facility];  Service: Endoscopy;  Laterality: N/A;  . ESOPHAGOGASTRODUODENOSCOPY (EGD) WITH PROPOFOL N/A 06/16/2015   Procedure: ESOPHAGOGASTRODUODENOSCOPY (EGD) WITH PROPOFOL;  Surgeon: [name], MD;  [facility];  Service: Endoscopy;  Laterality: N/A;  . ESOPHAGOGASTRODUODENOSCOPY (EGD) WITH PROPOFOL N/A 11/11/2015   Procedure: ESOPHAGOGASTRODUODENOSCOPY (EGD) WITH PROPOFOL;  Surgeon: [name], MD;  Location: [facility];  Service: Endoscopy;  Laterality: N/A;  . EXAMINATION UNDER ANESTHESIA  07/24/2012   Procedure: EXAM UNDER ANESTHESIA;  Surgeon: [name], MD;  Location: [facility];  Service: General;  Laterality: N/A;  . humeral fracture surgery Right yrs ago  . PILONIDAL CYST EXCISION  07/24/2012   Procedure: CYST EXCISION PILONIDAL SIMPLE;  Surgeon: [name], MD;  Location: [facility];  Service: General;  Laterality: N/A;  Exam Under Anesthesia,, Excision Pilonidal Disease,   . right shoulder replacement  2008   x 2  . UPPER GASTROINTESTINAL ENDOSCOPY    . WART FULGURATION  07/24/2012   Procedure: FULGURATION ANAL WART;  Surgeon: [name], MD;  Location: [facility];  Service: General;  Laterality: N/A;  excision of raphe mass   Social History   Socioeconomic History  . Marital status: Single    Spouse name: Not on file  . Number of children: 0  . Years of education: Not on file  . Highest education level: Not on file  Occupational History  . Occupation: Disabled  Tobacco Use  . Smoking status: Every Day    Current packs/day: 0.50    Average packs/day: 0.4 packs/day for 50.3 years (18.9 ttl pk-yrs)    Types: Cigarettes    Start date: 10/04/1998  . Smokeless tobacco: Never  . Tobacco comments:    4 or 5 cigarettes a day   Vaping Use  . Vaping status: Never Used  Substance and Sexual Activity  . Alcohol use: No    Alcohol/week: 0.0 standard drinks of alcohol  . Drug use: Not Currently    Frequency: 3.0 times per week    Types: Marijuana, Cocaine  . Sexual activity: Not Currently    Partners: Male    Comment: pt. given condoms, used marijuana -3 days ago 05/14/16  Other Topics Concern  . Not  on file  Social History Narrative   Pt lives in a ground floor apartment in [place].   Social Drivers of Health   Financial Resource Strain: Low Risk  (11/08/2023)   Overall Financial Resource Strain (CARDIA)   . Difficulty of Paying Living Expenses: Not very hard  Food Insecurity: Food Insecurity Present (11/08/2023)   Hunger Vital Sign   . Worried About Programme researcher, broadcasting/film/video in the Last Year: Sometimes true   . Ran Out of Food in the Last Year: Often true  Transportation Needs: No Transportation Needs (11/08/2023)   PRAPARE - Transportation   . Lack of Transportation (Medical): No   . Lack of Transportation (Non-Medical): No  Physical Activity: Inactive (11/08/2023)   Exercise Vital Sign   . Days of Exercise per Week: 0 days   . Minutes of Exercise per Session: 0 min  Stress: No Stress Concern Present (11/08/2023)   [employer] of Occupational Health - Occupational Stress Questionnaire   . Feeling of Stress : Not at all  Social  Connections: Socially Isolated (11/08/2023)   Social Connection and Isolation Panel [NHANES]   . Frequency of Communication with Friends and Family: Three times a week   . Frequency of Social Gatherings with Friends and Family: Three times a week   . Attends Religious Services: Never   . Active Member of Clubs or Organizations: No   . Attends Banker Meetings: Never   . Marital Status: Never married   Family History  Problem Relation Age of Onset  . Arthritis Mother   . Hypertension Mother   . Heart disease Mother        MI age 21  . Heart attack Sister   . Diabetes Maternal Grandmother   . Stomach cancer Paternal Grandmother   . Colon cancer Neg Hx   . Colon polyps Neg Hx   . Esophageal cancer Neg Hx   . Rectal cancer Neg Hx    Allergies  Allergen Reactions  . Citrus Swelling    Mouth itching and swelling    Current Outpatient Medications  Medication Sig Dispense Refill  . albuterol (VENTOLIN HFA) 108 (90 Base) MCG/ACT inhaler Inhale 2 puffs into the lungs every 6 (six) hours as needed for wheezing or shortness of breath. 1 each 2  . amLODipine (NORVASC) 5 MG tablet Take 1 tablet (5 mg total) by mouth daily. 90 tablet 3  . aspirin 81 MG chewable tablet Chew 1 tablet (81 mg total) by mouth daily. 90 tablet 3  . BIKTARVY 30-120-15 MG TABS TAKE 1 TABLET BY MOUTH EVERY DAY 30 tablet 11  . Blood Pressure KIT 1 kit by Does not apply route daily. 1 kit 0  . diclofenac Sodium (VOLTAREN ARTHRITIS PAIN) 1 % GEL Apply 2 g topically 4 (four) times daily. 100 g 3  . DULoxetine (CYMBALTA) 60 MG capsule Take 1 capsule (60 mg total) by mouth daily. For chronic pain 90 capsule 1  . hydrocortisone 2.5 % ointment Apply topically 2 (two) times daily. Apply around rectal area. 30 g 2  . methocarbamol (ROBAXIN) 500 MG tablet Take 2 tablets (1,000 mg total) by mouth every 8 (eight) hours as needed. 90 tablet 1  . metoprolol succinate (TOPROL-XL) 25 MG 24 hr tablet TAKE 1/2 TABLET BY  MOUTH DAILY 45 tablet 0  . nitroGLYCERIN (NITROSTAT) 0.4 MG SL tablet Place 1 tablet (0.4 mg total) under the tongue every 5 (five) minutes as needed for chest pain (trouble swallowing). 25 tablet  1  . rosuvastatin (CRESTOR) 40 MG tablet Take 1 tablet (40 mg total) by mouth daily. 90 tablet 3   No current facility-administered medications for this visit.   No results found.  Review of Systems:   A ROS was performed including pertinent positives and negatives as documented in the HPI.  Physical Exam :   Constitutional: NAD and appears stated age Neurological: Alert and oriented Psych: Appropriate affect and cooperative There were no vitals taken for this visit.   Comprehensive Musculoskeletal Exam:    Left shoulder with essentially no active range of motion due to pain.  Distal neurosensory exam is intact.  Passive range of motion deferred due to extreme pain   Imaging:   Xray (3 views left shoulder): Normal    I personally reviewed and interpreted the radiographs.   Assessment and Plan:   58 y.o. male with left shoulder pain consistent with adhesive capsulitis.  At today's visit I did recommend initial ultrasound-guided injection.  I will plan to see him back in 2 weeks for reassessment  -Shoulder injection provided after verbal consent obtained    Procedure Note  Patient: Benjamin Herrera             Date of Birth: 1965-12-11           MRN: [id number]             Visit Date: 01/13/2024  Procedures: Visit Diagnoses: No diagnosis found.  Large Joint Inj: L glenohumeral on 01/13/2024 3:26 PM Indications: pain Details: 22 G 1.5 in needle, ultrasound-guided anterior approach  Arthrogram: No  Medications: 4 mL lidocaine 1 %; 80 mg triamcinolone acetonide 40 MG/ML Outcome: tolerated well, no immediate complications Procedure, treatment alternatives, risks and benefits explained, specific risks discussed. Consent was given by the patient. Immediately prior to procedure a  time out was called to verify the correct patient, procedure, equipment, support staff and site/side marked as required. Patient was prepped and draped in the usual sterile fashion.      I personally saw and evaluated the patient, and participated in the management and treatment plan.  Huel Cote, MD Attending Physician, Orthopedic Surgery  This document was dictated using Dragon voice recognition software. A reasonable attempt at proof reading has been made to minimize errors.

## 2024-01-25 ENCOUNTER — Other Ambulatory Visit: Payer: Self-pay

## 2024-01-25 NOTE — Patient Outreach (Signed)
  Medicaid Managed Care   Unsuccessful Outreach Note  01/25/2024 Name: Benjamin Herrera MRN: [id number] DOB: 04-24-1966  Referred by: Hoy Register, MD Reason for referral : High Risk Managed Medicaid (MM social work unsuccessful telephone outreach )   An unsuccessful telephone outreach was attempted today. The patient was referred to the case management team for assistance with care management and care coordination.   Follow Up Plan: The patient has been provided with contact information for the care management team and has been advised to call with any health related questions or concerns.   [name], MHA [facility]  Managed [facility] Social Worker [phone]

## 2024-01-25 NOTE — Patient Instructions (Signed)
  Medicaid Managed Care   Unsuccessful Outreach Note  01/25/2024 Name: Benjamin Herrera MRN: [id number] DOB: 04-24-1966  Referred by: Hoy Register, MD Reason for referral : High Risk Managed Medicaid (MM social work unsuccessful telephone outreach )   An unsuccessful telephone outreach was attempted today. The patient was referred to the case management team for assistance with care management and care coordination.   Follow Up Plan: The patient has been provided with contact information for the care management team and has been advised to call with any health related questions or concerns.   [name], MHA [facility]  Managed [facility] Social Worker [phone]

## 2024-01-27 ENCOUNTER — Ambulatory Visit (HOSPITAL_BASED_OUTPATIENT_CLINIC_OR_DEPARTMENT_OTHER): Admitting: Orthopaedic Surgery

## 2024-01-31 ENCOUNTER — Ambulatory Visit: Admitting: Gastroenterology

## 2024-01-31 ENCOUNTER — Encounter: Payer: Self-pay | Admitting: Gastroenterology

## 2024-01-31 VITALS — BP 110/63 | HR 84 | Temp 98.6°F | Resp 18 | Ht 72.0 in | Wt 197.0 lb

## 2024-01-31 DIAGNOSIS — K644 Residual hemorrhoidal skin tags: Secondary | ICD-10-CM

## 2024-01-31 DIAGNOSIS — D123 Benign neoplasm of transverse colon: Secondary | ICD-10-CM

## 2024-01-31 DIAGNOSIS — K621 Rectal polyp: Secondary | ICD-10-CM | POA: Diagnosis not present

## 2024-01-31 DIAGNOSIS — K635 Polyp of colon: Secondary | ICD-10-CM | POA: Diagnosis not present

## 2024-01-31 DIAGNOSIS — D128 Benign neoplasm of rectum: Secondary | ICD-10-CM

## 2024-01-31 DIAGNOSIS — D122 Benign neoplasm of ascending colon: Secondary | ICD-10-CM

## 2024-01-31 DIAGNOSIS — I10 Essential (primary) hypertension: Secondary | ICD-10-CM | POA: Diagnosis not present

## 2024-01-31 DIAGNOSIS — B3781 Candidal esophagitis: Secondary | ICD-10-CM

## 2024-01-31 DIAGNOSIS — K5140 Inflammatory polyps of colon without complications: Secondary | ICD-10-CM

## 2024-01-31 DIAGNOSIS — Z1211 Encounter for screening for malignant neoplasm of colon: Secondary | ICD-10-CM | POA: Diagnosis not present

## 2024-01-31 DIAGNOSIS — K648 Other hemorrhoids: Secondary | ICD-10-CM

## 2024-01-31 DIAGNOSIS — I2510 Atherosclerotic heart disease of native coronary artery without angina pectoris: Secondary | ICD-10-CM | POA: Diagnosis not present

## 2024-01-31 DIAGNOSIS — G4730 Sleep apnea, unspecified: Secondary | ICD-10-CM | POA: Diagnosis not present

## 2024-01-31 MED ORDER — SODIUM CHLORIDE 0.9 % IV SOLN: 500.0000 mL | INTRAVENOUS | Status: DC

## 2024-01-31 MED ORDER — FLUCONAZOLE 200 MG PO TABS: 200.0000 mg | ORAL_TABLET | Freq: Every day | ORAL | 0 refills | Status: DC

## 2024-01-31 NOTE — Op Note (Signed)
 [facility] Patient Name: Benjamin Herrera Procedure Date: 01/31/2024 12:37 PM MRN: [id number] Endoscopist: [name] , MD, [id number] Age: 58 Referring MD:  Date of Birth: April 21, 1966 Gender: Male Account #: [id number] Procedure:                Colonoscopy Indications:              Screening for colorectal malignant neoplasm Medicines:                Monitored Anesthesia Care Procedure:                Pre-Anesthesia Assessment:                           - Prior to the procedure, a History and Physical                            was performed, and patient medications and                            allergies were reviewed. The patient's tolerance of                            previous anesthesia was also reviewed. The risks                            and benefits of the procedure and the sedation                            options and risks were discussed with the patient.                            All questions were answered, and informed consent                            was obtained. Prior Anticoagulants: The patient has                            taken no anticoagulant or antiplatelet agents. ASA                            Grade Assessment: III - A patient with severe                            systemic disease. After reviewing the risks and                            benefits, the patient was deemed in satisfactory                            condition to undergo the procedure.                           After obtaining informed consent, the colonoscope  was passed under direct vision. Throughout the                            procedure, the patient's blood pressure, pulse, and                            oxygen saturations were monitored continuously. The                            Olympus Scope [id number] was introduced through the                            anus and advanced to the the cecum, identified by                             appendiceal orifice and ileocecal valve. The                            colonoscopy was performed without difficulty. The                            patient tolerated the procedure well. The quality                            of the bowel preparation was adequate. The                            ileocecal valve, appendiceal orifice, and rectum                            were photographed. Scope In: 12:57:40 PM Scope Out: 1:24:43 PM Scope Withdrawal Time: 0 hours 15 minutes 51 seconds  Total Procedure Duration: 0 hours 27 minutes 3 seconds  Findings:                 The perianal and digital rectal examinations were                            normal.                           A 4 mm polyp was found in the ascending colon. The                            polyp was sessile. The polyp was removed with a                            cold snare. Resection and retrieval were complete.                           A 1 mm polyp was found in the transverse colon. The                            polyp was sessile. The polyp was removed with  a                            cold biopsy forceps. Resection and retrieval were                            complete.                           Five sessile polyps were found in the rectum. The                            polyps were 2 to 3 mm in size. These polyps were                            removed with a cold snare. Resection and retrieval                            were complete.                           Non-bleeding external and internal hemorrhoids were                            found during retroflexion. The hemorrhoids were                            medium-sized. Complications:            No immediate complications. Estimated Blood Loss:     Estimated blood loss was minimal. Impression:               - One 4 mm polyp in the ascending colon, removed                            with a cold snare. Resected and retrieved.                           - One 1 mm  polyp in the transverse colon, removed                            with a cold biopsy forceps. Resected and retrieved.                           - Five 2 to 3 mm polyps in the rectum, removed with                            a cold snare. Resected and retrieved.                           - Non-bleeding external and internal hemorrhoids. Recommendation:           - Patient has a contact number available for  emergencies. The signs and symptoms of potential                            delayed complications were discussed with the                            patient. Return to normal activities tomorrow.                            Written discharge instructions were provided to the                            patient.                           - Resume previous diet.                           - Continue present medications.                           - Await pathology results.                           - Repeat colonoscopy in 3 - 5 years for                            surveillance based on pathology results.                           - For future colonoscopy the patient will require                            an extended preparation. If there are any                            questions, please contact the gastroenterologist.                           - Diflucan 200mg  daily X 4 weeks for esophageal                            candidiasis [name], MD 01/31/2024 1:33:54 PM This report has been signed electronically.

## 2024-01-31 NOTE — Progress Notes (Unsigned)
 [facility] History and Physical   Primary Care Physician:  Hoy Register, MD   Reason for Procedure:  Colorectal cancer screening  Plan:    Screening colonoscopy with possible interventions as needed     HPI: Benjamin Herrera is a very pleasant 58 y.o. male here for screening colonoscopy. Denies any nausea, vomiting, abdominal pain, melena or bright red blood per rectum  The risks and benefits as well as alternatives of endoscopic procedure(s) have been discussed and reviewed. All questions answered. The patient agrees to proceed.    Past Medical History:  Diagnosis Date   Achalasia and cardiospasm    Allergy    Arthritis    back ,shoulders    Asthma    Boils    under arms hx of   CAD (coronary artery disease)    a. 09/2016: cath showing 100% RCA occlusion with collaterals and 90% OM1 (treated with DES)   Candidiasis of mouth    Candidiasis of unspecified site    Cholelithiasis    Cocaine abuse (HCC)    Quit 09/2016   Condyloma acuminatum in male of scrotum & anal canal s/p laser ablation 07/03/2012   Coronary artery disease    Dysphagia    Fracture, humerus    Frozen shoulder    left   GERD (gastroesophageal reflux disease)    Heart attack (HCC)    Hemorrhoids    Hepatitis 1993   A   Herpes labialis    HIV (human immunodeficiency virus infection) (HCC) DX 1993   Hypertension Dx 2014   off all bp meds for last 9 or 10 months   Pilonidal disease 01/10/2012   Polysubstance abuse (HCC)    Psoriasis    hx of   Sleep apnea    does  have CPAP   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/03/2012   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 08/03/2012    Past Surgical History:  Procedure Laterality Date   BALLOON DILATION N/A 11/11/2015   Procedure: BALLOON DILATION;  Surgeon: [name], MD;  Location: [facility];  Service: Endoscopy;  Laterality: N/A;   BOTOX INJECTION  10/11/2012    Procedure: BOTOX INJECTION;  Surgeon: [name], MD;  Location: [facility];  Service: Endoscopy;  Laterality: N/A;   BOTOX INJECTION N/A 01/25/2013   Procedure: BOTOX INJECTION;  Surgeon: [name], MD;  Location: [facility];  Service: Endoscopy;  Laterality: N/A;   BOTOX INJECTION N/A 06/21/2013   Procedure: BOTOX INJECTION;  Surgeon: [name], MD;  Location: [facility];  Service: Endoscopy;  Laterality: N/A;   BOTOX INJECTION N/A 10/08/2013   Procedure: BOTOX INJECTION;  Surgeon: [name], MD;  Location: [facility];  Service: Endoscopy;  Laterality: N/A;   BOTOX INJECTION N/A 06/16/2015   Procedure: BOTOX INJECTION;  Surgeon: [name], MD;  Location: [facility];  Service: Endoscopy;  Laterality: N/A;   CARDIAC CATHETERIZATION N/A 10/11/2016   Procedure: Left Heart Cath and Coronary Angiography;  Surgeon: Kathleene Hazel, MD;  mRCA 100% w/ L>R collaterals, OM1 90%   CARDIAC CATHETERIZATION N/A 10/11/2016   Procedure: Coronary Stent Intervention;  Surgeon: Kathleene Hazel, MD;  RESOLUTE ONYX 3.5X15 DES OM1   COLONOSCOPY  05/19/2012   Procedure: COLONOSCOPY;  Surgeon: [name], MD;  Location: [facility];  Service: Endoscopy;  Laterality: N/A;  jill trying to contact pt to come in 0830 for 930  case, phone not accepting messages   COLONOSCOPY     ESOPHAGEAL MANOMETRY  05/29/2012   Procedure: ESOPHAGEAL MANOMETRY (EM);  Surgeon: [name], MD;  Location: [facility];  Service: Endoscopy;  Laterality: N/A;   ESOPHAGEAL MANOMETRY N/A 10/06/2015   Procedure: ESOPHAGEAL MANOMETRY (EM);  Surgeon: [name], MD;  [facility]: [facility];  Service: Endoscopy;  Laterality: N/A;   ESOPHAGOGASTRODUODENOSCOPY  11/11/2011   Procedure: ESOPHAGOGASTRODUODENOSCOPY (EGD);  Surgeon: [name], MD;  Location: [facility];  Service: Endoscopy;  Laterality: N/A;  botox injection  called Pt to change time of procedure per Dr Arlyce Dice    ESOPHAGOGASTRODUODENOSCOPY  03/10/2012   Procedure: ESOPHAGOGASTRODUODENOSCOPY (EGD);  Surgeon: [name], MD;  Location: [facility];  Service: Endoscopy;  Laterality: N/A;   ESOPHAGOGASTRODUODENOSCOPY  10/11/2012   Procedure: ESOPHAGOGASTRODUODENOSCOPY (EGD);  Surgeon: [name], MD;  Location: [facility];  Service: Endoscopy;  Laterality: N/A;   ESOPHAGOGASTRODUODENOSCOPY N/A 01/25/2013   Procedure: ESOPHAGOGASTRODUODENOSCOPY (EGD);  Surgeon: [name], MD;  Location: [facility];  Service: Endoscopy;  Laterality: N/A;   ESOPHAGOGASTRODUODENOSCOPY N/A 06/21/2013   Procedure: ESOPHAGOGASTRODUODENOSCOPY (EGD);  Surgeon: [name], MD;  Location: [facility];  Service: Endoscopy;  Laterality: N/A;   ESOPHAGOGASTRODUODENOSCOPY (EGD) WITH PROPOFOL N/A 10/08/2013   Procedure: ESOPHAGOGASTRODUODENOSCOPY (EGD) WITH PROPOFOL;  Surgeon: [name], MD;  [facility]: [facility];  Service: Endoscopy;  Laterality: N/A;   ESOPHAGOGASTRODUODENOSCOPY (EGD) WITH PROPOFOL N/A 06/16/2015   Procedure: ESOPHAGOGASTRODUODENOSCOPY (EGD) WITH PROPOFOL;  Surgeon: [name], MD;  [facility]: [facility];  Service: Endoscopy;  Laterality: N/A;   ESOPHAGOGASTRODUODENOSCOPY (EGD) WITH PROPOFOL N/A 11/11/2015   Procedure: ESOPHAGOGASTRODUODENOSCOPY (EGD) WITH PROPOFOL;  Surgeon: [name], MD;  Location: [facility];  Service: Endoscopy;  Laterality: N/A;   EXAMINATION UNDER ANESTHESIA  07/24/2012   Procedure: EXAM UNDER ANESTHESIA;  Surgeon: [name], MD;  Location: [facility];  Service: General;  Laterality: N/A;   humeral fracture surgery Right yrs ago   PILONIDAL CYST EXCISION  07/24/2012   Procedure: CYST EXCISION PILONIDAL SIMPLE;  Surgeon: [name], MD;  Location: [facility];  Service: General;  Laterality: N/A;  Exam Under Anesthesia,, Excision Pilonidal Disease,    right shoulder replacement  2008   x 2   UPPER GASTROINTESTINAL ENDOSCOPY     WART FULGURATION   07/24/2012   Procedure: FULGURATION ANAL WART;  Surgeon: [name], MD;  Location: [facility];  Service: General;  Laterality: N/A;  excision of raphe mass    Prior to Admission medications   Medication Sig Start Date End Date Taking? Authorizing Provider  aspirin 81 MG chewable tablet Chew 1 tablet (81 mg total) by mouth daily. 10/28/16  Yes Funches, Gerilyn Nestle, MD  BIKTARVY 30-120-15 MG TABS TAKE 1 TABLET BY MOUTH EVERY DAY 09/29/23  Yes [name], MD  diclofenac Sodium (VOLTAREN ARTHRITIS PAIN) 1 % GEL Apply 2 g topically 4 (four) times daily. 01/05/24  Yes Anders Simmonds, PA-C  DULoxetine (CYMBALTA) 60 MG capsule Take 1 capsule (60 mg total) by mouth daily. For chronic pain 11/08/23  Yes Hoy Register, MD  methocarbamol (ROBAXIN) 500 MG tablet Take 2 tablets (1,000 mg total) by mouth every 8 (eight) hours as needed. 01/05/24  Yes Georgian Co M, PA-C  metoprolol succinate (TOPROL-XL) 25 MG 24 hr tablet TAKE 1/2 TABLET BY MOUTH DAILY 12/28/23  Yes Alver Sorrow, NP  rosuvastatin (CRESTOR) 40 MG tablet Take 1 tablet (40 mg total) by mouth  daily. 03/23/23  Yes Alver Sorrow, NP  albuterol (VENTOLIN HFA) 108 (90 Base) MCG/ACT inhaler Inhale 2 puffs into the lungs every 6 (six) hours as needed for wheezing or shortness of breath. 04/07/21   [name], MD  amLODipine (NORVASC) 5 MG tablet Take 1 tablet (5 mg total) by mouth daily. 03/23/23 10/10/23  Alver Sorrow, NP  Blood Pressure KIT 1 kit by Does not apply route daily. 03/23/23   Alver Sorrow, NP  hydrocortisone 2.5 % ointment Apply topically 2 (two) times daily. Apply around rectal area. 02/06/21   Pham, Minh Q, RPH-CPP  nitroGLYCERIN (NITROSTAT) 0.4 MG SL tablet Place 1 tablet (0.4 mg total) under the tongue every 5 (five) minutes as needed for chest pain (trouble swallowing). Patient not taking: Reported on 01/31/2024 01/02/24   Doree Albee, PA-C    Current Outpatient Medications  Medication Sig Dispense  Refill   aspirin 81 MG chewable tablet Chew 1 tablet (81 mg total) by mouth daily. 90 tablet 3   BIKTARVY 30-120-15 MG TABS TAKE 1 TABLET BY MOUTH EVERY DAY 30 tablet 11   diclofenac Sodium (VOLTAREN ARTHRITIS PAIN) 1 % GEL Apply 2 g topically 4 (four) times daily. 100 g 3   DULoxetine (CYMBALTA) 60 MG capsule Take 1 capsule (60 mg total) by mouth daily. For chronic pain 90 capsule 1   methocarbamol (ROBAXIN) 500 MG tablet Take 2 tablets (1,000 mg total) by mouth every 8 (eight) hours as needed. 90 tablet 1   metoprolol succinate (TOPROL-XL) 25 MG 24 hr tablet TAKE 1/2 TABLET BY MOUTH DAILY 45 tablet 0   rosuvastatin (CRESTOR) 40 MG tablet Take 1 tablet (40 mg total) by mouth daily. 90 tablet 3   albuterol (VENTOLIN HFA) 108 (90 Base) MCG/ACT inhaler Inhale 2 puffs into the lungs every 6 (six) hours as needed for wheezing or shortness of breath. 1 each 2   amLODipine (NORVASC) 5 MG tablet Take 1 tablet (5 mg total) by mouth daily. 90 tablet 3   Blood Pressure KIT 1 kit by Does not apply route daily. 1 kit 0   hydrocortisone 2.5 % ointment Apply topically 2 (two) times daily. Apply around rectal area. 30 g 2   nitroGLYCERIN (NITROSTAT) 0.4 MG SL tablet Place 1 tablet (0.4 mg total) under the tongue every 5 (five) minutes as needed for chest pain (trouble swallowing). (Patient not taking: Reported on 01/31/2024) 25 tablet 1   Current Facility-Administered Medications  Medication Dose Route Frequency Provider Last Rate Last Admin   0.9 %  sodium chloride infusion  500 mL Intravenous Continuous Avry Roedl, Eleonore Chiquito, MD        Allergies as of 01/31/2024 - Review Complete 01/31/2024  Allergen Reaction Noted   Citrus Swelling 11/04/2016    Family History  Problem Relation Age of Onset   Arthritis Mother    Hypertension Mother    Heart disease Mother        MI age 7   Heart attack Sister    Diabetes Maternal Grandmother    Stomach cancer Paternal Grandmother    Colon cancer Neg Hx    Colon  polyps Neg Hx    Esophageal cancer Neg Hx    Rectal cancer Neg Hx     Social History   Socioeconomic History   Marital status: Single    Spouse name: Not on file   Number of children: 0   Years of education: Not on file   Highest education level:  Not on file  Occupational History   Occupation: Disabled  Tobacco Use   Smoking status: Every Day    Current packs/day: 0.50    Average packs/day: 0.4 packs/day for 50.3 years (18.9 ttl pk-yrs)    Types: Cigarettes    Start date: 10/04/1998   Smokeless tobacco: Never   Tobacco comments:    4 or 5 cigarettes a day   Vaping Use   Vaping status: Never Used  Substance and Sexual Activity   Alcohol use: No    Alcohol/week: 0.0 standard drinks of alcohol   Drug use: Not Currently    Frequency: 3.0 times per week    Types: Marijuana, Cocaine   Sexual activity: Not Currently    Partners: Male    Comment: pt. given condoms, used marijuana -3 days ago 05/14/16  Other Topics Concern   Not on file  Social History Narrative   Pt lives in a ground floor apartment in [place].   Social Drivers of Corporate investment banker Strain: Low Risk  (11/08/2023)   Overall Financial Resource Strain (CARDIA)    Difficulty of Paying Living Expenses: Not very hard  Food Insecurity: Food Insecurity Present (11/08/2023)   Hunger Vital Sign    Worried About Running Out of Food in the Last Year: Sometimes true    Ran Out of Food in the Last Year: Often true  Transportation Needs: No Transportation Needs (11/08/2023)   PRAPARE - Administrator, Civil Service (Medical): No    Lack of Transportation (Non-Medical): No  Physical Activity: Inactive (11/08/2023)   Exercise Vital Sign    Days of Exercise per Week: 0 days    Minutes of Exercise per Session: 0 min  Stress: No Stress Concern Present (11/08/2023)   [employer] of Occupational Health - Occupational Stress Questionnaire    Feeling of Stress : Not at all  Social Connections: Socially  Isolated (11/08/2023)   Social Connection and Isolation Panel [NHANES]    Frequency of Communication with Friends and Family: Three times a week    Frequency of Social Gatherings with Friends and Family: Three times a week    Attends Religious Services: Never    Active Member of Clubs or Organizations: No    Attends Banker Meetings: Never    Marital Status: Never married  Intimate Partner Violence: Not At Risk (11/08/2023)   Humiliation, Afraid, Rape, and Kick questionnaire    Fear of Current or Ex-Partner: No    Emotionally Abused: No    Physically Abused: No    Sexually Abused: No    Review of Systems:  All other review of systems negative except as mentioned in the HPI.  Physical Exam: Vital signs in last 24 hours: BP 135/82   Pulse (!) 102   Temp 98.6 F (37 C)   Ht 6' (1.829 m)   Wt 197 lb (89.4 kg)   SpO2 97%   BMI 26.72 kg/m  General:   Alert, NAD Lungs:  Clear .   Heart:  Regular rate and rhythm Abdomen:  Soft, nontender and nondistended. Neuro/Psych:  Alert and cooperative. Normal mood and affect. A and O x 3  Reviewed labs, radiology imaging, old records and pertinent past GI work up  Patient is appropriate for planned procedure(s) and anesthesia in an ambulatory setting   K. Scherry Ran , MD [phone]

## 2024-01-31 NOTE — Patient Instructions (Signed)

## 2024-01-31 NOTE — Progress Notes (Signed)
 Called to room to assist during endoscopic procedure.  Patient ID and intended procedure confirmed with present staff. Received instructions for my participation in the procedure from the performing physician.

## 2024-01-31 NOTE — Progress Notes (Unsigned)
 Sedate, gd SR, tolerated procedure well, VSS, report to RN

## 2024-01-31 NOTE — Progress Notes (Unsigned)
 Pt's states no medical or surgical changes since previsit or office visit.

## 2024-02-01 ENCOUNTER — Encounter (HOSPITAL_BASED_OUTPATIENT_CLINIC_OR_DEPARTMENT_OTHER): Payer: Self-pay

## 2024-02-01 ENCOUNTER — Telehealth: Payer: Self-pay

## 2024-02-01 NOTE — Telephone Encounter (Signed)
 No answer after follow up call. Voice message left.

## 2024-02-03 ENCOUNTER — Ambulatory Visit (HOSPITAL_BASED_OUTPATIENT_CLINIC_OR_DEPARTMENT_OTHER): Admitting: Student

## 2024-02-03 LAB — SURGICAL PATHOLOGY

## 2024-02-07 ENCOUNTER — Other Ambulatory Visit (HOSPITAL_BASED_OUTPATIENT_CLINIC_OR_DEPARTMENT_OTHER): Payer: Self-pay

## 2024-02-07 ENCOUNTER — Encounter (HOSPITAL_BASED_OUTPATIENT_CLINIC_OR_DEPARTMENT_OTHER): Payer: Self-pay | Admitting: Student

## 2024-02-07 ENCOUNTER — Ambulatory Visit (INDEPENDENT_AMBULATORY_CARE_PROVIDER_SITE_OTHER): Admitting: Student

## 2024-02-07 DIAGNOSIS — M7502 Adhesive capsulitis of left shoulder: Secondary | ICD-10-CM

## 2024-02-07 MED ORDER — METHYLPREDNISOLONE 4 MG PO TBPK
ORAL_TABLET | ORAL | 0 refills | Status: DC
Start: 2024-02-07 — End: 2024-04-03
  Filled 2024-02-07: qty 21, 6d supply, fill #0

## 2024-02-07 NOTE — Progress Notes (Signed)
 Chief Complaint: Left shoulder pain     History of Present Illness:    Benjamin Herrera is a 58 y.o. male presenting to clinic today for follow-up of left shoulder pain.  Patient reports that glenohumeral cortisone injection performed at last visit on 01/13/2024 gave him about 1 week of relief.  His pain has been worsening and today he reports that pain levels are higher than prior to injection.  He does state that he has pain radiating down into the right arm ending in the forearm.  He has not gotten any relief with a heating pad and topicals.  Denies any neck pain.  PMH/PSH/Family History/Social History/Meds/Allergies:    Past Medical History:  Diagnosis Date   Achalasia and cardiospasm    Allergy    Arthritis    back ,shoulders    Asthma    Boils    under arms hx of   CAD (coronary artery disease)    a. 09/2016: cath showing 100% RCA occlusion with collaterals and 90% OM1 (treated with DES)   Candidiasis of mouth    Candidiasis of unspecified site    Cholelithiasis    Cocaine abuse (HCC)    Quit 09/2016   Condyloma acuminatum in male of scrotum & anal canal s/p laser ablation 07/03/2012   Coronary artery disease    Dysphagia    Fracture, humerus    Frozen shoulder    left   GERD (gastroesophageal reflux disease)    Heart attack (HCC)    Hemorrhoids    Hepatitis 1993   A   Herpes labialis    HIV (human immunodeficiency virus infection) (HCC) DX 1993   Hypertension Dx 2014   off all bp meds for last 9 or 10 months   Pilonidal disease 01/10/2012   Polysubstance abuse (HCC)    Psoriasis    hx of   Sleep apnea    does  have CPAP   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/03/2012   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 08/03/2012   Past Surgical History:  Procedure Laterality Date   BALLOON DILATION N/A 11/11/2015   Procedure: BALLOON DILATION;  Surgeon: [name], MD;  Location: [facility];   Service: Endoscopy;  Laterality: N/A;   BOTOX INJECTION  10/11/2012   Procedure: BOTOX INJECTION;  Surgeon: [name], MD;  Location: [facility];  Service: Endoscopy;  Laterality: N/A;   BOTOX INJECTION N/A 01/25/2013   Procedure: BOTOX INJECTION;  Surgeon: [name], MD;  Location: [facility];  Service: Endoscopy;  Laterality: N/A;   BOTOX INJECTION N/A 06/21/2013   Procedure: BOTOX INJECTION;  Surgeon: [name], MD;  Location: [facility];  Service: Endoscopy;  Laterality: N/A;   BOTOX INJECTION N/A 10/08/2013   Procedure: BOTOX INJECTION;  Surgeon: [name], MD;  Location: [facility];  Service: Endoscopy;  Laterality: N/A;   BOTOX INJECTION N/A 06/16/2015   Procedure: BOTOX INJECTION;  Surgeon: [name], MD;  Location: [facility];  Service: Endoscopy;  Laterality: N/A;   CARDIAC CATHETERIZATION N/A 10/11/2016   Procedure: Left Heart Cath and Coronary Angiography;  Surgeon: Kathleene Hazel, MD;  mRCA 100% w/ L>R collaterals, OM1 90%   CARDIAC CATHETERIZATION N/A 10/11/2016   Procedure: Coronary Stent Intervention;  Surgeon: Kathleene Hazel, MD;  RESOLUTE ONYX 3.5X15 DES OM1   COLONOSCOPY  05/19/2012   Procedure: COLONOSCOPY;  Surgeon: [name], MD;  Location: [facility];  Service: Endoscopy;  Laterality: N/A;  jill trying to contact pt to come in 0830 for 930 case, phone not accepting messages   COLONOSCOPY     ESOPHAGEAL MANOMETRY  05/29/2012   Procedure: ESOPHAGEAL MANOMETRY (EM);  Surgeon: Claudette Cue, MD;  Location: [facility];  Service: Endoscopy;  Laterality: N/A;   ESOPHAGEAL MANOMETRY N/A 10/06/2015   Procedure: ESOPHAGEAL MANOMETRY (EM);  Surgeon: Sergio Dandy, MD;  Location: [facility];  Service: Endoscopy;  Laterality: N/A;   ESOPHAGOGASTRODUODENOSCOPY  11/11/2011   Procedure: ESOPHAGOGASTRODUODENOSCOPY (EGD);  Surgeon: Claudette Cue, MD;  Location: [facility];  Service: Endoscopy;  Laterality: N/A;  botox  injection  called Pt to change time of procedure per Dr Arvie Latus   ESOPHAGOGASTRODUODENOSCOPY  03/10/2012   Procedure: ESOPHAGOGASTRODUODENOSCOPY (EGD);  Surgeon: Claudette Cue, MD;  Location: [facility];  Service: Endoscopy;  Laterality: N/A;   ESOPHAGOGASTRODUODENOSCOPY  10/11/2012   Procedure: ESOPHAGOGASTRODUODENOSCOPY (EGD);  Surgeon: Claudette Cue, MD;  Location: [facility];  Service: Endoscopy;  Laterality: N/A;   ESOPHAGOGASTRODUODENOSCOPY N/A 01/25/2013   Procedure: ESOPHAGOGASTRODUODENOSCOPY (EGD);  Surgeon: Claudette Cue, MD;  Location: [facility];  Service: Endoscopy;  Laterality: N/A;   ESOPHAGOGASTRODUODENOSCOPY N/A 06/21/2013   Procedure: ESOPHAGOGASTRODUODENOSCOPY (EGD);  Surgeon: Claudette Cue, MD;  Location: [facility];  Service: Endoscopy;  Laterality: N/A;   ESOPHAGOGASTRODUODENOSCOPY (EGD) WITH PROPOFOL N/A 10/08/2013   Procedure: ESOPHAGOGASTRODUODENOSCOPY (EGD) WITH PROPOFOL;  Surgeon: Claudette Cue, MD;  Location: [facility];  Service: Endoscopy;  Laterality: N/A;   ESOPHAGOGASTRODUODENOSCOPY (EGD) WITH PROPOFOL N/A 06/16/2015   Procedure: ESOPHAGOGASTRODUODENOSCOPY (EGD) WITH PROPOFOL;  Surgeon: Claudette Cue, MD;  Location: [facility];  Service: Endoscopy;  Laterality: N/A;   ESOPHAGOGASTRODUODENOSCOPY (EGD) WITH PROPOFOL N/A 11/11/2015   Procedure: ESOPHAGOGASTRODUODENOSCOPY (EGD) WITH PROPOFOL;  Surgeon: Sergio Dandy, MD;  Location: [facility];  Service: Endoscopy;  Laterality: N/A;   EXAMINATION UNDER ANESTHESIA  07/24/2012   Procedure: EXAM UNDER ANESTHESIA;  Surgeon: Eddye Goodie, MD;  Location: [facility];  Service: General;  Laterality: N/A;   humeral fracture surgery Right yrs ago   PILONIDAL CYST EXCISION  07/24/2012   Procedure: CYST EXCISION PILONIDAL SIMPLE;  Surgeon: Eddye Goodie, MD;  Location: [facility];  Service: General;  Laterality: N/A;  Exam Under Anesthesia,, Excision Pilonidal Disease,    right shoulder replacement  2008   x 2    UPPER GASTROINTESTINAL ENDOSCOPY     WART FULGURATION  07/24/2012   Procedure: FULGURATION ANAL WART;  Surgeon: Eddye Goodie, MD;  Location: [facility];  Service: General;  Laterality: N/A;  excision of raphe mass   Social History   Socioeconomic History   Marital status: Single    Spouse name: Not on file   Number of children: 0   Years of education: Not on file   Highest education level: Not on file  Occupational History   Occupation: Disabled  Tobacco Use   Smoking status: Every Day    Current packs/day: 0.50    Average packs/day: 0.4 packs/day for 50.3 years (18.9 ttl pk-yrs)    Types: Cigarettes    Start date: 10/04/1998   Smokeless tobacco: Never   Tobacco comments:    4 or 5 cigarettes a day   Vaping Use   Vaping status: Never Used  Substance and Sexual Activity   Alcohol use: No    Alcohol/week: 0.0 standard drinks of alcohol   Drug use: Not Currently    Frequency: 3.0 times  per week    Types: Marijuana, Cocaine   Sexual activity: Not Currently    Partners: Male    Comment: pt. given condoms, used marijuana -3 days ago 05/14/16  Other Topics Concern   Not on file  Social History Narrative   Pt lives in a ground floor apartment in [place].   Social Drivers of Corporate investment banker Strain: Low Risk  (11/08/2023)   Overall Financial Resource Strain (CARDIA)    Difficulty of Paying Living Expenses: Not very hard  Food Insecurity: Food Insecurity Present (11/08/2023)   Hunger Vital Sign    Worried About Running Out of Food in the Last Year: Sometimes true    Ran Out of Food in the Last Year: Often true  Transportation Needs: No Transportation Needs (11/08/2023)   PRAPARE - Administrator, Civil Service (Medical): No    Lack of Transportation (Non-Medical): No  Physical Activity: Inactive (11/08/2023)   Exercise Vital Sign    Days of Exercise per Week: 0 days    Minutes of Exercise per Session: 0 min  Stress: No Stress Concern Present (11/08/2023)    [employer] of Occupational Health - Occupational Stress Questionnaire    Feeling of Stress : Not at all  Social Connections: Socially Isolated (11/08/2023)   Social Connection and Isolation Panel [NHANES]    Frequency of Communication with Friends and Family: Three times a week    Frequency of Social Gatherings with Friends and Family: Three times a week    Attends Religious Services: Never    Active Member of Clubs or Organizations: No    Attends Banker Meetings: Never    Marital Status: Never married   Family History  Problem Relation Age of Onset   Arthritis Mother    Hypertension Mother    Heart disease Mother        MI age 60   Heart attack Sister    Diabetes Maternal Grandmother    Stomach cancer Paternal Grandmother    Colon cancer Neg Hx    Colon polyps Neg Hx    Esophageal cancer Neg Hx    Rectal cancer Neg Hx    Allergies  Allergen Reactions   Citrus Swelling    Mouth itching and swelling    Current Outpatient Medications  Medication Sig Dispense Refill   methylPREDNISolone (MEDROL DOSEPAK) 4 MG TBPK tablet Take per packet instructions 1 each 0   albuterol (VENTOLIN HFA) 108 (90 Base) MCG/ACT inhaler Inhale 2 puffs into the lungs every 6 (six) hours as needed for wheezing or shortness of breath. 1 each 2   amLODipine (NORVASC) 5 MG tablet Take 1 tablet (5 mg total) by mouth daily. 90 tablet 3   aspirin 81 MG chewable tablet Chew 1 tablet (81 mg total) by mouth daily. 90 tablet 3   BIKTARVY 30-120-15 MG TABS TAKE 1 TABLET BY MOUTH EVERY DAY 30 tablet 11   Blood Pressure KIT 1 kit by Does not apply route daily. 1 kit 0   diclofenac Sodium (VOLTAREN ARTHRITIS PAIN) 1 % GEL Apply 2 g topically 4 (four) times daily. 100 g 3   DULoxetine (CYMBALTA) 60 MG capsule Take 1 capsule (60 mg total) by mouth daily. For chronic pain 90 capsule 1   fluconazole (DIFLUCAN) 200 MG tablet Take 1 tablet (200 mg total) by mouth daily. 30 tablet 0   hydrocortisone 2.5 %  ointment Apply topically 2 (two) times daily. Apply around rectal area. 30 g  2   methocarbamol (ROBAXIN) 500 MG tablet Take 2 tablets (1,000 mg total) by mouth every 8 (eight) hours as needed. 90 tablet 1   metoprolol succinate (TOPROL-XL) 25 MG 24 hr tablet TAKE 1/2 TABLET BY MOUTH DAILY 45 tablet 0   nitroGLYCERIN (NITROSTAT) 0.4 MG SL tablet Place 1 tablet (0.4 mg total) under the tongue every 5 (five) minutes as needed for chest pain (trouble swallowing). (Patient not taking: Reported on 01/31/2024) 25 tablet 1   rosuvastatin (CRESTOR) 40 MG tablet Take 1 tablet (40 mg total) by mouth daily. 90 tablet 3   No current facility-administered medications for this visit.   No results found.  Review of Systems:   A ROS was performed including pertinent positives and negatives as documented in the HPI.  Physical Exam :   Constitutional: NAD and appears stated age Neurological: Alert and oriented Psych: Appropriate affect and cooperative There were no vitals taken for this visit.   Comprehensive Musculoskeletal Exam:    Tenderness diffusely through left shoulder extending down into the left upper arm and forearm.  Minimal active range of motion with flexion and internal/external rotation.  Passive forward flexion to 90 degrees.  Grip strength 5/5 bilaterally.  No tenderness in the cervical spine with full cervical ROM in all planes.   Imaging:    Assessment and Plan:   58 y.o. male with chronic left shoulder pain.  Due to sudden onset with extremely limited range of motion and location of pain, this has appeared consistent with adhesive capsulitis.  He did receive a cortisone injection approximately 3 weeks ago which only gave 1 week of relief.  Today he is experiencing some pain radiating down the right arm into the forearm, raising some concern for involvement of a cervical radiculopathy.  I discussed that rather than repeating injection today, I would like to start him on a Medrol Dosepak and  monitor the amount of relief he gets with this.  Should symptoms continue to worsen, may consider further imaging of the shoulder to confirm diagnosis.     I personally saw and evaluated the patient, and participated in the management and treatment plan.   Sharrell Deck, PA-C Orthopedics

## 2024-02-14 ENCOUNTER — Telehealth (HOSPITAL_BASED_OUTPATIENT_CLINIC_OR_DEPARTMENT_OTHER): Payer: Self-pay | Admitting: Student

## 2024-02-14 NOTE — Telephone Encounter (Signed)
 Patient states that he needs something for pain prnazone is not helping him

## 2024-02-16 ENCOUNTER — Encounter (HOSPITAL_BASED_OUTPATIENT_CLINIC_OR_DEPARTMENT_OTHER): Payer: Self-pay | Admitting: Student

## 2024-02-16 ENCOUNTER — Ambulatory Visit (HOSPITAL_BASED_OUTPATIENT_CLINIC_OR_DEPARTMENT_OTHER): Admitting: Student

## 2024-02-16 ENCOUNTER — Ambulatory Visit (HOSPITAL_BASED_OUTPATIENT_CLINIC_OR_DEPARTMENT_OTHER)

## 2024-02-16 ENCOUNTER — Other Ambulatory Visit (HOSPITAL_BASED_OUTPATIENT_CLINIC_OR_DEPARTMENT_OTHER): Payer: Self-pay

## 2024-02-16 DIAGNOSIS — M7502 Adhesive capsulitis of left shoulder: Secondary | ICD-10-CM | POA: Diagnosis not present

## 2024-02-16 DIAGNOSIS — M47812 Spondylosis without myelopathy or radiculopathy, cervical region: Secondary | ICD-10-CM | POA: Diagnosis not present

## 2024-02-16 DIAGNOSIS — M5412 Radiculopathy, cervical region: Secondary | ICD-10-CM

## 2024-02-16 DIAGNOSIS — M542 Cervicalgia: Secondary | ICD-10-CM | POA: Diagnosis not present

## 2024-02-16 DIAGNOSIS — M40202 Unspecified kyphosis, cervical region: Secondary | ICD-10-CM | POA: Diagnosis not present

## 2024-02-16 MED ORDER — TRAMADOL HCL 50 MG PO TABS
50.0000 mg | ORAL_TABLET | Freq: Four times a day (QID) | ORAL | 0 refills | Status: AC | PRN
  Filled 2024-02-16: qty 20, 5d supply, fill #0

## 2024-02-16 NOTE — Progress Notes (Signed)
 Chief Complaint: Left shoulder pain     History of Present Illness:   02/16/24: Patient returns today for follow-up of left shoulder pain.  Today he reports that symptoms have continued to worsen, particularly despite finishing entire course of Medrol  Dosepak provided at last visit.  Pain is located mainly in the left shoulder, particularly laterally, but does radiate down the left arm into the forearm.  He has his arm immobilized in a sling, as movement causes significant discomfort.  He has been unable to use his left arm for ADLs.   02/07/24: Benjamin Herrera is a 58 y.o. male presenting to clinic today for follow-up of left shoulder pain.  Patient reports that glenohumeral cortisone injection performed at last visit on 01/13/2024 gave him about 1 week of relief.  His pain has been worsening and today he reports that pain levels are higher than prior to injection.  He does state that he has pain radiating down into the right arm ending in the forearm.  He has not gotten any relief with a heating pad and topicals.  Denies any neck pain.  PMH/PSH/Family History/Social History/Meds/Allergies:    Past Medical History:  Diagnosis Date   Achalasia and cardiospasm    Allergy    Arthritis    back ,shoulders    Asthma    Boils    under arms hx of   CAD (coronary artery disease)    a. 09/2016: cath showing 100% RCA occlusion with collaterals and 90% OM1 (treated with DES)   Candidiasis of mouth    Candidiasis of unspecified site    Cholelithiasis    Cocaine abuse (HCC)    Quit 09/2016   Condyloma acuminatum in male of scrotum & anal canal s/p laser ablation 07/03/2012   Coronary artery disease    Dysphagia    Fracture, humerus    Frozen shoulder    left   GERD (gastroesophageal reflux disease)    Heart attack (HCC)    Hemorrhoids    Hepatitis 1993   A   Herpes labialis    HIV (human immunodeficiency virus infection) (HCC) DX 1993   Hypertension Dx 2014   off all bp meds for last  9 or 10 months   Pilonidal disease 01/10/2012   Polysubstance abuse (HCC)    Psoriasis    hx of   Sleep apnea    does  have CPAP   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/03/2012   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 08/03/2012   Past Surgical History:  Procedure Laterality Date   BALLOON DILATION N/A 11/11/2015   Procedure: BALLOON DILATION;  Surgeon: Sergio Dandy, MD;  Location: [facility];  Service: Endoscopy;  Laterality: N/A;   BOTOX  INJECTION  10/11/2012   Procedure: BOTOX  INJECTION;  Surgeon: Claudette Cue, MD;  Location: [facility];  Service: Endoscopy;  Laterality: N/A;   BOTOX  INJECTION N/A 01/25/2013   Procedure: BOTOX  INJECTION;  Surgeon: Claudette Cue, MD;  [facility]: [facility];  Service: Endoscopy;  Laterality: N/A;   BOTOX  INJECTION N/A 06/21/2013   Procedure: BOTOX  INJECTION;  Surgeon: Claudette Cue, MD;  [facility]: [facility];  Service: Endoscopy;  Laterality: N/A;   BOTOX  INJECTION N/A 10/08/2013   Procedure: BOTOX  INJECTION;  Surgeon: Claudette Cue, MD;  [facility]: [facility];  Service: Endoscopy;  Laterality: N/A;   BOTOX  INJECTION N/A 06/16/2015   Procedure: BOTOX  INJECTION;  Surgeon: Porfirio Bristol  Wynell Heath, MD;  Location: [facility];  Service: Endoscopy;  Laterality: N/A;   CARDIAC CATHETERIZATION N/A 10/11/2016   Procedure: Left Heart Cath and Coronary Angiography;  Surgeon: Odie Benne, MD;  mRCA 100% w/ L>R collaterals, OM1 90%   CARDIAC CATHETERIZATION N/A 10/11/2016   Procedure: Coronary Stent Intervention;  Surgeon: Odie Benne, MD;  RESOLUTE ONYX 3.5X15 DES OM1   COLONOSCOPY  05/19/2012   Procedure: COLONOSCOPY;  Surgeon: Claudette Cue, MD;  [facility]: [facility];  Service: Endoscopy;  Laterality: N/A;  jill trying to contact pt to come in 0830 for 930 case, phone not accepting messages   COLONOSCOPY     ESOPHAGEAL MANOMETRY  05/29/2012   Procedure: ESOPHAGEAL  MANOMETRY (EM);  Surgeon: Claudette Cue, MD;  [facility]: [facility];  Service: Endoscopy;  Laterality: N/A;   ESOPHAGEAL MANOMETRY N/A 10/06/2015   Procedure: ESOPHAGEAL MANOMETRY (EM);  Surgeon: Sergio Dandy, MD;  [facility]: [facility];  Service: Endoscopy;  Laterality: N/A;   ESOPHAGOGASTRODUODENOSCOPY  11/11/2011   Procedure: ESOPHAGOGASTRODUODENOSCOPY (EGD);  Surgeon: Claudette Cue, MD;  Location: [facility];  Service: Endoscopy;  Laterality: N/A;  botox  injection  called Pt to change time of procedure per Dr Arvie Latus   ESOPHAGOGASTRODUODENOSCOPY  03/10/2012   Procedure: ESOPHAGOGASTRODUODENOSCOPY (EGD);  Surgeon: Claudette Cue, MD;  Location: [facility];  Service: Endoscopy;  Laterality: N/A;   ESOPHAGOGASTRODUODENOSCOPY  10/11/2012   Procedure: ESOPHAGOGASTRODUODENOSCOPY (EGD);  Surgeon: Claudette Cue, MD;  Location: [facility];  Service: Endoscopy;  Laterality: N/A;   ESOPHAGOGASTRODUODENOSCOPY N/A 01/25/2013   Procedure: ESOPHAGOGASTRODUODENOSCOPY (EGD);  Surgeon: Claudette Cue, MD;  Location: [facility];  Service: Endoscopy;  Laterality: N/A;   ESOPHAGOGASTRODUODENOSCOPY N/A 06/21/2013   Procedure: ESOPHAGOGASTRODUODENOSCOPY (EGD);  Surgeon: Claudette Cue, MD;  Location: [facility];  Service: Endoscopy;  Laterality: N/A;   ESOPHAGOGASTRODUODENOSCOPY (EGD) WITH PROPOFOL  N/A 10/08/2013   Procedure: ESOPHAGOGASTRODUODENOSCOPY (EGD) WITH PROPOFOL ;  Surgeon: Claudette Cue, MD;  [facility];  Service: Endoscopy;  Laterality: N/A;   ESOPHAGOGASTRODUODENOSCOPY (EGD) WITH PROPOFOL  N/A 06/16/2015   Procedure: ESOPHAGOGASTRODUODENOSCOPY (EGD) WITH PROPOFOL ;  Surgeon: Claudette Cue, MD;  [facility];  Service: Endoscopy;  Laterality: N/A;   ESOPHAGOGASTRODUODENOSCOPY (EGD) WITH PROPOFOL  N/A 11/11/2015   Procedure: ESOPHAGOGASTRODUODENOSCOPY (EGD) WITH PROPOFOL ;  Surgeon: Sergio Dandy, MD;  [facility];  Service: Endoscopy;  Laterality:  N/A;   EXAMINATION UNDER ANESTHESIA  07/24/2012   Procedure: EXAM UNDER ANESTHESIA;  Surgeon: Eddye Goodie, MD;  Location: [facility];  Service: General;  Laterality: N/A;   humeral fracture surgery Right yrs ago   PILONIDAL CYST EXCISION  07/24/2012   Procedure: CYST EXCISION PILONIDAL SIMPLE;  Surgeon: Eddye Goodie, MD;  Location: [facility];  Service: General;  Laterality: N/A;  Exam Under Anesthesia,, Excision Pilonidal Disease,    right shoulder replacement  2008   x 2   UPPER GASTROINTESTINAL ENDOSCOPY     WART FULGURATION  07/24/2012   Procedure: FULGURATION ANAL WART;  Surgeon: Eddye Goodie, MD;  Location: [facility];  Service: General;  Laterality: N/A;  excision of raphe mass   Social History   Socioeconomic History   Marital status: Single    Spouse name: Not on file   Number of children: 0   Years of education: Not on file   Highest education level: Not on file  Occupational History   Occupation: Disabled  Tobacco Use   Smoking status: Every Day    Current packs/day: 0.50  Average packs/day: 0.4 packs/day for 50.4 years (18.9 ttl pk-yrs)    Types: Cigarettes    Start date: 10/04/1998   Smokeless tobacco: Never   Tobacco comments:    4 or 5 cigarettes a day   Vaping Use   Vaping status: Never Used  Substance and Sexual Activity   Alcohol use: No    Alcohol/week: 0.0 standard drinks of alcohol   Drug use: Not Currently    Frequency: 3.0 times per week    Types: Marijuana, Cocaine   Sexual activity: Not Currently    Partners: Male    Comment: pt. given condoms, used marijuana -3 days ago 05/14/16  Other Topics Concern   Not on file  Social History Narrative   Pt lives in a ground floor apartment in [place].   Social Drivers of Corporate investment banker Strain: Low Risk  (11/08/2023)   Overall Financial Resource Strain (CARDIA)    Difficulty of Paying Living Expenses: Not very hard  Food Insecurity: Food Insecurity Present (11/08/2023)   Hunger Vital Sign     Worried About Running Out of Food in the Last Year: Sometimes true    Ran Out of Food in the Last Year: Often true  Transportation Needs: No Transportation Needs (11/08/2023)   PRAPARE - Administrator, Civil Service (Medical): No    Lack of Transportation (Non-Medical): No  Physical Activity: Inactive (11/08/2023)   Exercise Vital Sign    Days of Exercise per Week: 0 days    Minutes of Exercise per Session: 0 min  Stress: No Stress Concern Present (11/08/2023)   [employer] of Occupational Health - Occupational Stress Questionnaire    Feeling of Stress : Not at all  Social Connections: Socially Isolated (11/08/2023)   Social Connection and Isolation Panel [NHANES]    Frequency of Communication with Friends and Family: Three times a week    Frequency of Social Gatherings with Friends and Family: Three times a week    Attends Religious Services: Never    Active Member of Clubs or Organizations: No    Attends Engineer, structural: Never    Marital Status: Never married   Family History  Problem Relation Age of Onset   Arthritis Mother    Hypertension Mother    Heart disease Mother        MI age 34   Heart attack Sister    Diabetes Maternal Grandmother    Stomach cancer Paternal Grandmother    Colon cancer Neg Hx    Colon polyps Neg Hx    Esophageal cancer Neg Hx    Rectal cancer Neg Hx    Allergies  Allergen Reactions   Citrus Swelling    Mouth itching and swelling    Current Outpatient Medications  Medication Sig Dispense Refill   albuterol  (VENTOLIN  HFA) 108 (90 Base) MCG/ACT inhaler Inhale 2 puffs into the lungs every 6 (six) hours as needed for wheezing or shortness of breath. 1 each 2   amLODipine  (NORVASC ) 5 MG tablet Take 1 tablet (5 mg total) by mouth daily. 90 tablet 3   aspirin  81 MG chewable tablet Chew 1 tablet (81 mg total) by mouth daily. 90 tablet 3   BIKTARVY  30-120-15 MG TABS TAKE 1 TABLET BY MOUTH EVERY DAY 30 tablet 11   Blood  Pressure KIT 1 kit by Does not apply route daily. 1 kit 0   diclofenac  Sodium (VOLTAREN  ARTHRITIS PAIN) 1 % GEL Apply 2 g topically 4 (four)  times daily. 100 g 3   DULoxetine  (CYMBALTA ) 60 MG capsule Take 1 capsule (60 mg total) by mouth daily. For chronic pain 90 capsule 1   fluconazole  (DIFLUCAN ) 200 MG tablet Take 1 tablet (200 mg total) by mouth daily. 30 tablet 0   hydrocortisone  2.5 % ointment Apply topically 2 (two) times daily. Apply around rectal area. 30 g 2   methocarbamol  (ROBAXIN ) 500 MG tablet Take 2 tablets (1,000 mg total) by mouth every 8 (eight) hours as needed. 90 tablet 1   methylPREDNISolone  (MEDROL  DOSEPAK) 4 MG TBPK tablet Take per packet instructions 21 tablet 0   metoprolol  succinate (TOPROL -XL) 25 MG 24 hr tablet TAKE 1/2 TABLET BY MOUTH DAILY 45 tablet 0   nitroGLYCERIN  (NITROSTAT ) 0.4 MG SL tablet Place 1 tablet (0.4 mg total) under the tongue every 5 (five) minutes as needed for chest pain (trouble swallowing). (Patient not taking: Reported on 01/31/2024) 25 tablet 1   rosuvastatin  (CRESTOR ) 40 MG tablet Take 1 tablet (40 mg total) by mouth daily. 90 tablet 3   No current facility-administered medications for this visit.   No results found.  Review of Systems:   A ROS was performed including pertinent positives and negatives as documented in the HPI.  Physical Exam :   Constitutional: NAD and appears stated age Neurological: Alert and oriented Psych: Appropriate affect and cooperative There were no vitals taken for this visit.   Comprehensive Musculoskeletal Exam:    Tenderness in the left upper trapezius musculature from the base of the neck to the left shoulder.  Cervical range of motion slightly limited in all planes but nonpainful.  Positive Spurling's.  Tenderness in the anterior and lateral shoulder with limited ROM due to pain.  Radial pulse 2+ and grip strength 5/5 bilaterally.  Imaging:   Xray (cervical spine 4 views): Cervical kyphosis is noted.   Degenerative changes most notable at C5-C6 with anterior spurring of the C5 vertebral body.   I personally reviewed and interpreted the radiographs.  Assessment and Plan:   58 y.o. male with continued, worsening left shoulder pain that has become severe.  At this point it is affecting his ability to use his left arm even for ADLs.  Patient has not gotten significant relief with prior glenohumeral cortisone injection and Medrol  Dosepak.  Given his presentation today, I do have some concern for an underlying cervical pathology with radiculopathy in addition to potential adhesive capsulitis of the shoulder.  Will also consider [place] given his acute onset and worsening pain.  Will plan to order MRIs of both the cervical spine and left shoulder today for further evaluation.  Would like him to follow-up with Dr. Hermina Loosen shortly after for review and treatment discussion.   -Obtain cervical and left shoulder MRIs and follow-up for review    I personally saw and evaluated the patient, and participated in the management and treatment plan.   Sharrell Deck, PA-C Orthopedics

## 2024-02-20 ENCOUNTER — Telehealth (HOSPITAL_BASED_OUTPATIENT_CLINIC_OR_DEPARTMENT_OTHER): Payer: Self-pay | Admitting: Orthopaedic Surgery

## 2024-02-20 ENCOUNTER — Other Ambulatory Visit: Payer: Self-pay | Admitting: Orthopaedic Surgery

## 2024-02-20 MED ORDER — DIAZEPAM 5 MG PO TABS: 5.0000 mg | ORAL_TABLET | Freq: Four times a day (QID) | ORAL | 0 refills | Status: DC | PRN

## 2024-02-20 NOTE — Telephone Encounter (Signed)
 Can you advise or hold for [facility] on return?

## 2024-02-20 NOTE — Telephone Encounter (Signed)
 Patient states he needs something stronger than Tramadol  for his pain  and he needs something called in for his anxiety for his MRI

## 2024-02-20 NOTE — Telephone Encounter (Signed)
 Called and advised pt, he stated understanding

## 2024-02-27 ENCOUNTER — Other Ambulatory Visit: Payer: Self-pay | Admitting: Orthopaedic Surgery

## 2024-02-27 ENCOUNTER — Telehealth (HOSPITAL_BASED_OUTPATIENT_CLINIC_OR_DEPARTMENT_OTHER): Payer: Self-pay | Admitting: Orthopaedic Surgery

## 2024-02-27 MED ORDER — MELOXICAM 15 MG PO TABS: 15.0000 mg | ORAL_TABLET | Freq: Every day | ORAL | 1 refills | Status: DC

## 2024-02-27 NOTE — Telephone Encounter (Signed)
 Patient states his out of meds he does not know what the name of the meds are but he needs a refill he only has 2 pills left

## 2024-02-27 NOTE — Telephone Encounter (Signed)
 I called pt. He wants more tramadol . He stated we gave this to him on 4/24. I advised that we dont continue to give pain meds unless he has had surgery but pt asked I ask you because per pt he is in a lot of pain. He has appt with you on 5/14 Please advise

## 2024-02-27 NOTE — Telephone Encounter (Signed)
 Lvm advising

## 2024-02-27 NOTE — Telephone Encounter (Signed)
 Lvm advising pt, see previous message

## 2024-02-27 NOTE — Telephone Encounter (Signed)
 Patient states that he needs tramadol  50mg . The maloxicam  only puts him to sleep

## 2024-02-29 ENCOUNTER — Ambulatory Visit (HOSPITAL_BASED_OUTPATIENT_CLINIC_OR_DEPARTMENT_OTHER): Admitting: Orthopaedic Surgery

## 2024-03-01 ENCOUNTER — Encounter (HOSPITAL_BASED_OUTPATIENT_CLINIC_OR_DEPARTMENT_OTHER): Payer: Self-pay | Admitting: Student

## 2024-03-03 ENCOUNTER — Other Ambulatory Visit

## 2024-03-07 ENCOUNTER — Ambulatory Visit (HOSPITAL_BASED_OUTPATIENT_CLINIC_OR_DEPARTMENT_OTHER): Admitting: Orthopaedic Surgery

## 2024-03-07 DIAGNOSIS — M7592 Shoulder lesion, unspecified, left shoulder: Secondary | ICD-10-CM | POA: Diagnosis not present

## 2024-03-08 ENCOUNTER — Ambulatory Visit: Payer: Self-pay | Admitting: Gastroenterology

## 2024-03-12 ENCOUNTER — Telehealth: Payer: Self-pay | Admitting: Family Medicine

## 2024-03-12 NOTE — Telephone Encounter (Signed)
 FYI

## 2024-03-12 NOTE — Telephone Encounter (Signed)
 Copied from CRM [id number]. Topic: General - Other >> Mar 12, 2024 10:01 AM Benjamin Herrera wrote:  Reason for CRM: Patient is calling in requesting to speak with the clinic social worker. 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 Please follow up with patient.

## 2024-03-13 NOTE — Telephone Encounter (Addendum)
 I called patient and he explained that he needs help with bathing and dressing and is interested in personal care assistance. I explained the role of the PCS aide and their focusing on assisting  with ADLs.  He said he needs that but wants someone that he knows providing the care, not a stranger.    I then explained about the [facility] Program (CAP) services and how that program provides more hours than PCS and the patient can have a family member/ friend provide that care.  He then said that is what he wants. I went on to explain that to qualify for CAP the provider needs to attest to the fact that he would require a nursing home or ALF level of care without the CAP services.  He then said he is bedbound and really doesn't want to have a stranger providing the care.  I told him that there can be a 6 month + wait list for CAP.  He said again that he doesn't  want a stranger providing care, he would rather have someone he knows. I told him that I can give him the number for CAP/ [facility] and he can call and put himself on the wait list but ultimately his provider will need to attest to his level of care needs. He took the number and said he will call.  In the meantime he would like PCS and I told him that he will need to be seen by a provider. It has been more than 90 days since he saw Dr Adan Holms and Dulce Gibbs, PA is no longer working at [facility] on a regular basis to sign a PCS request. There are currently no appointments available at [facility] so I explained that he could go to the [facility] and tomorrow ,they will be at [facility]. He said he will try to go in the afternoon when his sister can take him I gave him the address and times the MMU is available tomorrow, 03/14/2024 and told him to call me if he has any questions.

## 2024-03-19 ENCOUNTER — Other Ambulatory Visit: Payer: Self-pay

## 2024-03-19 ENCOUNTER — Encounter (HOSPITAL_COMMUNITY): Payer: Self-pay | Admitting: *Deleted

## 2024-03-19 ENCOUNTER — Emergency Department (HOSPITAL_COMMUNITY)
Admission: EM | Admit: 2024-03-19 | Discharge: 2024-03-19 | Disposition: A | Discharge status: Home / Self Care | Attending: Emergency Medicine | Admitting: Emergency Medicine

## 2024-03-19 ENCOUNTER — Emergency Department (HOSPITAL_COMMUNITY)

## 2024-03-19 DIAGNOSIS — Z21 Asymptomatic human immunodeficiency virus [HIV] infection status: Secondary | ICD-10-CM | POA: Insufficient documentation

## 2024-03-19 DIAGNOSIS — M898X8 Other specified disorders of bone, other site: Secondary | ICD-10-CM | POA: Diagnosis not present

## 2024-03-19 DIAGNOSIS — Z7951 Long term (current) use of inhaled steroids: Secondary | ICD-10-CM | POA: Diagnosis not present

## 2024-03-19 DIAGNOSIS — J45909 Unspecified asthma, uncomplicated: Secondary | ICD-10-CM | POA: Insufficient documentation

## 2024-03-19 DIAGNOSIS — Z7982 Long term (current) use of aspirin: Secondary | ICD-10-CM | POA: Insufficient documentation

## 2024-03-19 DIAGNOSIS — M899 Disorder of bone, unspecified: Secondary | ICD-10-CM | POA: Diagnosis not present

## 2024-03-19 DIAGNOSIS — M898X2 Other specified disorders of bone, upper arm: Secondary | ICD-10-CM | POA: Diagnosis not present

## 2024-03-19 DIAGNOSIS — Z7952 Long term (current) use of systemic steroids: Secondary | ICD-10-CM | POA: Diagnosis not present

## 2024-03-19 DIAGNOSIS — M25512 Pain in left shoulder: Secondary | ICD-10-CM | POA: Diagnosis not present

## 2024-03-19 DIAGNOSIS — M898X9 Other specified disorders of bone, unspecified site: Secondary | ICD-10-CM

## 2024-03-19 DIAGNOSIS — I2510 Atherosclerotic heart disease of native coronary artery without angina pectoris: Secondary | ICD-10-CM | POA: Insufficient documentation

## 2024-03-19 MED ORDER — LIDOCAINE 5 % EX PTCH
1.0000 | MEDICATED_PATCH | CUTANEOUS | Status: DC
  Administered 2024-03-19: 1 via TRANSDERMAL
  Filled 2024-03-19: qty 1

## 2024-03-19 MED ORDER — OXYCODONE HCL 5 MG PO TABS: 5.0000 mg | ORAL_TABLET | ORAL | 0 refills | Status: DC | PRN

## 2024-03-19 MED ORDER — ACETAMINOPHEN 500 MG PO TABS
1000.0000 mg | ORAL_TABLET | Freq: Once | ORAL | Status: AC
  Administered 2024-03-19: 1000 mg via ORAL
  Filled 2024-03-19: qty 2

## 2024-03-19 MED ORDER — LIDOCAINE 5 % EX OINT: 1.0000 | TOPICAL_OINTMENT | Freq: Four times a day (QID) | CUTANEOUS | 0 refills | Status: AC | PRN

## 2024-03-19 MED ORDER — OXYCODONE HCL 5 MG PO TABS
5.0000 mg | ORAL_TABLET | Freq: Once | ORAL | Status: AC
  Administered 2024-03-19: 5 mg via ORAL
  Filled 2024-03-19: qty 1

## 2024-03-19 NOTE — Discharge Instructions (Addendum)
 As discussed, you will need follow-up with Dr. Guyann Leitz and your [facility] provider.  Seek emergency care if experiencing any new or worsening symptoms.  Alternating between 650 mg Tylenol  and 400 mg Advil : The best way to alternate taking Acetaminophen  (example Tylenol ) and Ibuprofen  (example Advil /Motrin ) is to take them 3 hours apart. For example, if you take ibuprofen  at 6 am you can then take Tylenol  at 9 am. You can continue this regimen throughout the day, making sure you do not exceed the recommended maximum dose for each drug.

## 2024-03-19 NOTE — ED Triage Notes (Signed)
 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 States the pain shoots from his shoulder down his arm. No numbness positive left radial pulse with good color.

## 2024-03-19 NOTE — ED Provider Notes (Signed)
 [facility] Provider Note   CSN: [id number] Arrival date & time: 03/19/24  1029     History  Chief Complaint  Patient presents with   Arm Pain    Benjamin Herrera is a 58 y.o. male with PMHx OA, asthma, CAD, adhesive capitus, GERD, HIV, polysubstance abuse who presents to ED concerned for his chronic left shoulder pain. Patient stating that he met with Dr. 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 Patient stating that his pain has been worsening since then. Patient denies any other new symptoms today.   Arm Pain       Home Medications Prior to Admission medications   Medication Sig Start Date End Date Taking? Authorizing Provider  lidocaine  (XYLOCAINE ) 5 % ointment Apply 1 Application topically 4 (four) times daily as needed for up to 5 days. 03/19/24 03/24/24 Yes Jaziel Bennett, Twila Gale F, PA-C  albuterol  (VENTOLIN  HFA) 108 (90 Base) MCG/ACT inhaler Inhale 2 puffs into the lungs every 6 (six) hours as needed for wheezing or shortness of breath. 04/07/21   Sandie Cross, MD  amLODipine  (NORVASC ) 5 MG tablet Take 1 tablet (5 mg total) by mouth daily. 03/23/23 10/10/23  Clearnce Curia, NP  aspirin  81 MG chewable tablet Chew 1 tablet (81 mg total) by mouth daily. 10/28/16   Funches, Josalyn, MD  BIKTARVY  30-120-15 MG TABS TAKE 1 TABLET BY MOUTH EVERY DAY 09/29/23   Sandie Cross, MD  Blood Pressure KIT 1 kit by Does not apply route daily. 03/23/23   Clearnce Curia, NP  diazepam  (VALIUM ) 5 MG tablet Take 1 tablet (5 mg total) by mouth every 6 (six) hours as needed for anxiety. 02/20/24   Wilhelmenia Harada, MD  diclofenac  Sodium (VOLTAREN  ARTHRITIS PAIN) 1 % GEL Apply 2 g topically 4 (four) times daily. 01/05/24   Hassie Lint, PA-C  DULoxetine  (CYMBALTA ) 60 MG capsule Take 1 capsule (60 mg total) by mouth daily. For chronic pain 11/08/23   Joaquin Mulberry, MD  fluconazole  (DIFLUCAN ) 200 MG tablet Take 1 tablet (200 mg  total) by mouth daily. 01/31/24   Nandigam, Kavitha V, MD  hydrocortisone  2.5 % ointment Apply topically 2 (two) times daily. Apply around rectal area. 02/06/21   Pham, Minh Q, RPH-CPP  meloxicam  (MOBIC ) 15 MG tablet Take 1 tablet (15 mg total) by mouth daily. 02/27/24   Wilhelmenia Harada, MD  methocarbamol  (ROBAXIN ) 500 MG tablet Take 2 tablets (1,000 mg total) by mouth every 8 (eight) hours as needed. 01/05/24   Hassie Lint, PA-C  methylPREDNISolone  (MEDROL  DOSEPAK) 4 MG TBPK tablet Take per packet instructions 02/07/24   Overturf, Jackson L, PA-C  metoprolol  succinate (TOPROL -XL) 25 MG 24 hr tablet TAKE 1/2 TABLET BY MOUTH DAILY 12/28/23   Walker, Caitlin S, NP  nitroGLYCERIN  (NITROSTAT ) 0.4 MG SL tablet Place 1 tablet (0.4 mg total) under the tongue every 5 (five) minutes as needed for chest pain (trouble swallowing). Patient not taking: Reported on 01/31/2024 01/02/24   Edmonia Gottron, PA-C  oxyCODONE  (ROXICODONE ) 5 MG immediate release tablet Take 1 tablet (5 mg total) by mouth every 4 (four) hours as needed for up to 5 doses for severe pain (pain score 7-10). 03/19/24   [place], PA-C  rosuvastatin  (CRESTOR ) 40 MG tablet Take 1 tablet (40 mg total) by mouth daily. 03/23/23   Walker, Caitlin S, NP      Allergies    Citrus  Review of Systems   Review of Systems  Musculoskeletal:        Arm pain    Physical Exam Updated Vital Signs BP 122/68   Pulse 85   Temp 98.1 F (36.7 C) (Oral)   Resp 20   Ht 6' (1.829 m)   Wt 93 kg   SpO2 100%   BMI 27.80 kg/m  Physical Exam Vitals and nursing note reviewed.  Constitutional:      General: He is not in acute distress.    Appearance: He is not ill-appearing or toxic-appearing.  HENT:     Head: Normocephalic and atraumatic.  Eyes:     General: No scleral icterus.       Right eye: No discharge.        Left eye: No discharge.     Conjunctiva/sclera: Conjunctivae normal.  Cardiovascular:     Rate and Rhythm: Normal rate.   Pulmonary:     Effort: Pulmonary effort is normal.  Abdominal:     General: Abdomen is flat.  Musculoskeletal:     Comments: Tenderness to palpation of left lateral humeral head. No erythema or swelling appreciated. Area non-tense. +2 radial pulse. Sensation to light touch intact. ROM restricted d/t pain.   Skin:    General: Skin is warm and dry.  Neurological:     General: No focal deficit present.     Mental Status: He is alert and oriented to person, place, and time. Mental status is at baseline.  Psychiatric:        Mood and Affect: Mood normal.        Behavior: Behavior normal.     ED Results / Procedures / Treatments   Labs (all labs ordered are listed, but only abnormal results are displayed) Labs Reviewed - No data to display  EKG None  Radiology DG Shoulder Left Result Date: 03/19/2024 CLINICAL DATA:  Pain. EXAM: LEFT SHOULDER - 3 VIEW COMPARISON:  11/08/2023. FINDINGS: No acute fracture, dislocation or subluxation. There is an osteolytic lesion with ill-defined margins consistent with a neoplastic process involving the proximal humerus. 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 No acute fracture. Electronically Signed   By: Sydell Eva M.D.   On: 03/19/2024 12:09    Procedures Procedures    Medications Ordered in ED Medications  lidocaine  (LIDODERM ) 5 % 1 patch (1 patch Transdermal Patch Applied 03/19/24 1336)  acetaminophen  (TYLENOL ) tablet 1,000 mg (1,000 mg Oral Given 03/19/24 1338)  oxyCODONE  (Oxy IR/ROXICODONE ) immediate release tablet 5 mg (5 mg Oral Given 03/19/24 1338)    ED Course/ Medical Decision Making/ A&P                                 Medical Decision Making Amount and/or Complexity of Data Reviewed Radiology: ordered.  Risk OTC drugs. Prescription drug management.   This patient presents to the ED for concern of shoulder pain, this involves an extensive number of treatment options, and is a complaint that carries  with it a high risk of complications and morbidity.  The differential diagnosis includes hemarthrosis, gout, septic joint, fracture, tendonitis, carpal tunnel syndrome, muscle strain, bursitis, compartment syndrome   Co morbidities that complicate the patient evaluation  OA, asthma, CAD, adhesive capitus, GERD, HIV, polysubstance abuse   Additional history obtained:  Dr.Newlin PCP   Problem List / ED Course / Critical interventions / Medication management  Patient presents to ED concern for acute  on chronic left shoulder pain. Physical exam with tenderness to palpation of lateral proximal humeral head. Rest of physical exam reassuring. Patient stating that lidocaine  patch helped his pain feel better. I ordered imaging studies including left shoulder xray. I independently visualized and interpreted imaging. I agree with the radiologist interpretation of proximal humeral lytic lesion.  Shared results with patient.  Answered all questions. I requested consultation with the orthopedic on-call provider Dr. Guyann Leitz,  and discussed lab and imaging findings as well as pertinent plan - they recommend: outpatient follow up. Dr. Guyann Leitz has already referred patient to specialty ortho/oncology provider with [facility]. Dr. Guyann Leitz will be following up with patient to make sure referral happens.  Talked with patient about this.  Patient stating that he has not yet heard back from the oncologist to establish an appointment.  I suspect that this is due to the recent holiday.  Patient agrees to follow-up with Dr. Guyann Leitz and possibly reach out to the oncologist to establish an appointment. Patient asking for prescription for the lidocaine  patches since they help with this pain. I have sent this prescription in.  Educated patient on alternating Advil  and Tylenol  for pain control.  Will prescribe 5mg  oxycodone  for breakthrough pain. I have reviewed the patients home medicines and have made adjustments as needed The  patient has been appropriately medically screened and/or stabilized in the ED. I have low suspicion for any other emergent medical condition which would require further screening, evaluation or treatment in the ED or require inpatient management. At time of discharge the patient is hemodynamically stable and in no acute distress. I have discussed work-up results and diagnosis with patient and answered all questions. Patient is agreeable with discharge plan. We discussed strict return precautions for returning to the emergency department and they verbalized understanding.     Ddx these are considered less likely due to history of present illness and physical exam -gout: no warmth or erythema;  -septic joint: afebrile; no warmth or erythema; no skin changes -fracture: xray without concern  -compartment syndrome: area not tense; neurovascularly intact   Social Determinants of Health:  none         Final Clinical Impression(s) / ED Diagnoses Final diagnoses:  Lytic lesion of bone on x-ray    Rx / DC Orders ED Discharge Orders          Ordered    oxyCODONE  (ROXICODONE ) 5 MG immediate release tablet  Every 4 hours PRN,   Status:  Discontinued        03/19/24 1458    lidocaine  (XYLOCAINE ) 5 % ointment  4 times daily PRN        03/19/24 1500    oxyCODONE  (ROXICODONE ) 5 MG immediate release tablet  Every 4 hours PRN        03/19/24 1500              [place], [place] 03/19/24 1504    Nicklas Barns, MD 03/21/24 [phone]

## 2024-03-20 ENCOUNTER — Ambulatory Visit: Admitting: Physician Assistant

## 2024-03-20 ENCOUNTER — Encounter: Payer: Self-pay | Admitting: Physician Assistant

## 2024-03-20 ENCOUNTER — Other Ambulatory Visit: Payer: Self-pay | Admitting: Orthopedic Surgery

## 2024-03-20 ENCOUNTER — Encounter: Payer: Self-pay | Admitting: Oncology

## 2024-03-20 ENCOUNTER — Encounter: Payer: Self-pay | Admitting: Orthopedic Surgery

## 2024-03-20 VITALS — BP 144/80 | HR 73 | Ht 72.0 in | Wt 201.0 lb

## 2024-03-20 DIAGNOSIS — M899 Disorder of bone, unspecified: Secondary | ICD-10-CM | POA: Diagnosis not present

## 2024-03-20 DIAGNOSIS — M7592 Shoulder lesion, unspecified, left shoulder: Secondary | ICD-10-CM

## 2024-03-20 DIAGNOSIS — Z029 Encounter for administrative examinations, unspecified: Secondary | ICD-10-CM

## 2024-03-20 DIAGNOSIS — M89512 Osteolysis, left shoulder: Secondary | ICD-10-CM

## 2024-03-20 NOTE — Progress Notes (Signed)
 Referral Review:   Patient referred by Dr. Belvia Boyer of [facility].  Brief HPI:  On chart review starting with scanned media image for new patient referral:  September 2005 RIGHT shoulder hemiarthroplasty for comminuted 4-part proximal humerus fracture and revision by Dr. Azalea Lento at [facility] in 2008.  Patient presented to Dr. Guyann Leitz on 03/07/2024 due to worsening LEFT shoulder pain x 3-4 months, not felt to have been caused by a known injury or action.  In office, x-rays were performed of his left shoulder noting what appears to be an invasive lesion of his left proximal humerus with extensive erosive changes of his lateral cortex of his proximal humerus immediately distal to his surgical neck.    Eval'd in ED yesterday, 03/19/2024, for left shoulder pain.  Plan films performed.   Addt'l Referrals Placed:  Yes: "Musculoskeletal oncologist at [facility]" placed by Dr. Guyann Leitz.  Addt'l Imaging or Procedures Placed:  Yes: MRI and note stated a CT ABD Pelvis too, though order not seen in Epic.   Imaging Supporting Referral:   11/08/2023 DG SHOULDER LEFT  IMPRESSION:  1. No acute fracture or dislocation.  2. Mild-to-moderate atherosclerotic calcifications within the aortic  arch.   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 No acute fracture.    Age Related Cancer Screenings:   Colonoscopy or Cologuard (M/F > 45):  01/31/2024 colonoscopy and multiple Upper Endo's  PSA (M > 45):  Not found in EHR     Patient contacted by phone, verifying identify with 2 patient identifiers, and scheduled to Diagnostic Clinic appointment.  What to expect discussed with patient, no questions from patient at this point.  Welcome letter emailed to patient at his request.  My call-back number provided to patient, as well as he had it on his caller ID.

## 2024-03-20 NOTE — Progress Notes (Unsigned)
 [facility] [phone]   [phone]  INITIAL CONSULTATION:  Patient Care Team: Joaquin Mulberry, MD as PCP - General (Family Medicine) Alwin Baars Aretha Kubas, MD as PCP - Infectious Diseases (Infectious Diseases) Maximo Spar Aviva Lemmings, MD as PCP - Cardiology (Cardiology) Claudette Cue, MD (Inactive) as Consulting Physician (Gastroenterology) Candyce Champagne, MD as Consulting Physician (General Surgery)  CHIEF COMPLAINTS/PURPOSE OF CONSULTATION:  "Lytic lesion"  HISTORY OF PRESENTING ILLNESS:  Benjamin Herrera 58 y.o. 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  On review of the previous records patient was first evaluated for acute onset of left shoulder pain limiting range of motion on 11/08/23 by PCP.  X-ray from same day showed no acute fracture or dislocation.  Patient was prescribed physical therapy and was evaluated by orthopedist Dr. Hermina Loosen and on 01/13/2024.  At that time symptoms were concerning for adhesive capsulitis and he was treated with a steroid injection. Pain persisted and patient presented to Dr. Guyann Leitz on 03/07/2024. X-rays were performed of his left shoulder noting what appears to be an invasive lesion of his left proximal humerus with extensive erosive changes of his lateral cortex of his proximal humerus immediately distal to his surgical neck and plan was for referral to ortho oncology at OSH. Patient presented to the ED on 03/19/24 for worsening left shoulder pain. Dr. Guyann Leitz was consulted and still recommended the need for oncology referral therefore referral placed to [facility]. Dr. Guyann Leitz also ordered MRI of left shoulder and CT CAP.  On exam today patient reports his pain is uncontrolled. He  is having difficulty performing ADLs because pain has decreased the ROM of his shoulder. He describes pain as burning and stinging with radiation from shoulder to down his arm. He is wearing a sling from orthopedics. He denies any numbness or tingling. He has been prescribed oxycodone  IR 5 mg and it only provides minimal relief. He is having difficulty sleeping due to pain.  Social history reveals he smokes about half a pack of cigarettes a day for the past thirty to forty years. He denies alcohol use but has a history of marijuana and cocaine use, with cocaine use ceasing a few months ago. Family history includes maternal uncle having prostate cancer and maternal grandmother with stomach cancer. Patient recently had a colonoscopy, in 01/2024. He does not recall having his PSA level checked in the past.    MEDICAL HISTORY:  Past Medical History:  Diagnosis Date   Achalasia and cardiospasm    Allergy    Arthritis    back ,shoulders    Asthma    Boils    under arms hx of   CAD (coronary artery disease)    a. 09/2016: cath showing 100% RCA occlusion with collaterals and 90% OM1 (treated with DES)   Candidiasis of mouth    Candidiasis of unspecified site    Cholelithiasis    Cocaine abuse (HCC)    Quit 09/2016   Condyloma acuminatum in male of scrotum & anal canal s/p laser ablation 07/03/2012   Coronary artery disease    Dysphagia    Fracture, humerus    Frozen shoulder    left   GERD (gastroesophageal reflux disease)    Heart attack (HCC)    Hemorrhoids    Hepatitis 1993  A   Herpes labialis    HIV (human immunodeficiency virus infection) (HCC) DX 1993   Hypertension Dx 2014   off all bp meds for last 9 or 10 months   Pilonidal disease 01/10/2012   Polysubstance abuse (HCC)    Psoriasis    hx of   Sleep apnea    does  have CPAP   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/03/2012   Squamous cell carcinoma in situ of skin of perineum near  scrotum 08/03/2012    SURGICAL HISTORY: Past Surgical History:  Procedure Laterality Date   BALLOON DILATION N/A 11/11/2015   Procedure: BALLOON DILATION;  Surgeon: Sergio Dandy, MD;  Location: [facility];  Service: Endoscopy;  Laterality: N/A;   BOTOX  INJECTION  10/11/2012   Procedure: BOTOX  INJECTION;  Surgeon: Claudette Cue, MD;  [facility]: [facility];  Service: Endoscopy;  Laterality: N/A;   BOTOX  INJECTION N/A 01/25/2013   Procedure: BOTOX  INJECTION;  Surgeon: Claudette Cue, MD;  [facility]: [facility];  Service: Endoscopy;  Laterality: N/A;   BOTOX  INJECTION N/A 06/21/2013   Procedure: BOTOX  INJECTION;  Surgeon: Claudette Cue, MD;  [facility]: [facility];  Service: Endoscopy;  Laterality: N/A;   BOTOX  INJECTION N/A 10/08/2013   Procedure: BOTOX  INJECTION;  Surgeon: Claudette Cue, MD;  [facility]: [facility];  Service: Endoscopy;  Laterality: N/A;   BOTOX  INJECTION N/A 06/16/2015   Procedure: BOTOX  INJECTION;  Surgeon: Claudette Cue, MD;  [facility]: [facility];  Service: Endoscopy;  Laterality: N/A;   CARDIAC CATHETERIZATION N/A 10/11/2016   Procedure: Left Heart Cath and Coronary Angiography;  Surgeon: Odie Benne, MD;  mRCA 100% w/ L>R collaterals, OM1 90%   CARDIAC CATHETERIZATION N/A 10/11/2016   Procedure: Coronary Stent Intervention;  Surgeon: Odie Benne, MD;  RESOLUTE ONYX 3.5X15 DES OM1   COLONOSCOPY  05/19/2012   Procedure: COLONOSCOPY;  Surgeon: Claudette Cue, MD;  [facility]: [facility];  Service: Endoscopy;  Laterality: N/A;  jill trying to contact pt to come in 0830 for 930 case, phone not accepting messages   COLONOSCOPY     ESOPHAGEAL MANOMETRY  05/29/2012   Procedure: ESOPHAGEAL MANOMETRY (EM);  Surgeon: Claudette Cue, MD;  [facility]: [facility];  Service: Endoscopy;  Laterality: N/A;   ESOPHAGEAL MANOMETRY N/A 10/06/2015   Procedure: ESOPHAGEAL MANOMETRY (EM);  Surgeon: Sergio Dandy, MD;  [facility];  Service:  Endoscopy;  Laterality: N/A;   ESOPHAGOGASTRODUODENOSCOPY  11/11/2011   Procedure: ESOPHAGOGASTRODUODENOSCOPY (EGD);  Surgeon: Claudette Cue, MD;  Location: [facility];  Service: Endoscopy;  Laterality: N/A;  botox  injection  called Pt to change time of procedure per Dr Arvie Latus   ESOPHAGOGASTRODUODENOSCOPY  03/10/2012   Procedure: ESOPHAGOGASTRODUODENOSCOPY (EGD);  Surgeon: Claudette Cue, MD;  Location: [facility];  Service: Endoscopy;  Laterality: N/A;   ESOPHAGOGASTRODUODENOSCOPY  10/11/2012   Procedure: ESOPHAGOGASTRODUODENOSCOPY (EGD);  Surgeon: Claudette Cue, MD;  Location: [facility];  Service: Endoscopy;  Laterality: N/A;   ESOPHAGOGASTRODUODENOSCOPY N/A 01/25/2013   Procedure: ESOPHAGOGASTRODUODENOSCOPY (EGD);  Surgeon: Claudette Cue, MD;  Location: [facility];  Service: Endoscopy;  Laterality: N/A;   ESOPHAGOGASTRODUODENOSCOPY N/A 06/21/2013   Procedure: ESOPHAGOGASTRODUODENOSCOPY (EGD);  Surgeon: Claudette Cue, MD;  Location: [facility];  Service: Endoscopy;  Laterality: N/A;   ESOPHAGOGASTRODUODENOSCOPY (EGD) WITH PROPOFOL  N/A 10/08/2013   Procedure: ESOPHAGOGASTRODUODENOSCOPY (EGD) WITH PROPOFOL ;  Surgeon: Claudette Cue, MD;  [facility];  Service: Endoscopy;  Laterality: N/A;  ESOPHAGOGASTRODUODENOSCOPY (EGD) WITH PROPOFOL  N/A 06/16/2015   Procedure: ESOPHAGOGASTRODUODENOSCOPY (EGD) WITH PROPOFOL ;  Surgeon: Claudette Cue, MD;  [facility];  Service: Endoscopy;  Laterality: N/A;   ESOPHAGOGASTRODUODENOSCOPY (EGD) WITH PROPOFOL  N/A 11/11/2015   Procedure: ESOPHAGOGASTRODUODENOSCOPY (EGD) WITH PROPOFOL ;  Surgeon: Sergio Dandy, MD;  [facility];  Service: Endoscopy;  Laterality: N/A;   EXAMINATION UNDER ANESTHESIA  07/24/2012   Procedure: EXAM UNDER ANESTHESIA;  Surgeon: Eddye Goodie, MD;  Location: [facility];  Service: General;  Laterality: N/A;   humeral fracture surgery Right yrs ago   PILONIDAL CYST EXCISION  07/24/2012    Procedure: CYST EXCISION PILONIDAL SIMPLE;  Surgeon: Eddye Goodie, MD;  Location: [facility];  Service: General;  Laterality: N/A;  Exam Under Anesthesia,, Excision Pilonidal Disease,    right shoulder replacement  2008   x 2   UPPER GASTROINTESTINAL ENDOSCOPY     WART FULGURATION  07/24/2012   Procedure: FULGURATION ANAL WART;  Surgeon: Eddye Goodie, MD;  Location: [facility];  Service: General;  Laterality: N/A;  excision of raphe mass    SOCIAL HISTORY: Social History   Socioeconomic History   Marital status: Single    Spouse name: Not on file   Number of children: 0   Years of education: Not on file   Highest education level: Not on file  Occupational History   Occupation: Disabled  Tobacco Use   Smoking status: Every Day    Current packs/day: 0.50    Average packs/day: 0.4 packs/day for 50.5 years (19.0 ttl pk-yrs)    Types: Cigarettes    Start date: 10/04/1998   Smokeless tobacco: Never   Tobacco comments:    4 or 5 cigarettes a day   Vaping Use   Vaping status: Never Used  Substance and Sexual Activity   Alcohol use: No    Alcohol/week: 0.0 standard drinks of alcohol   Drug use: Yes    Frequency: 3.0 times per week    Types: Marijuana, Cocaine   Sexual activity: Not Currently    Partners: Male    Comment: pt. given condoms, used marijuana -3 days ago 05/14/16  Other Topics Concern   Not on file  Social History Narrative   Pt lives in a ground floor apartment in [place].   Social Drivers of Corporate investment banker Strain: Low Risk  (11/08/2023)   Overall Financial Resource Strain (CARDIA)    Difficulty of Paying Living Expenses: Not very hard  Food Insecurity: Food Insecurity Present (11/08/2023)   Hunger Vital Sign    Worried About Running Out of Food in the Last Year: Sometimes true    Ran Out of Food in the Last Year: Often true  Transportation Needs: No Transportation Needs (11/08/2023)   PRAPARE - Administrator, Civil Service (Medical): No     Lack of Transportation (Non-Medical): No  Physical Activity: Inactive (11/08/2023)   Exercise Vital Sign    Days of Exercise per Week: 0 days    Minutes of Exercise per Session: 0 min  Stress: No Stress Concern Present (11/08/2023)   [employer] of Occupational Health - Occupational Stress Questionnaire    Feeling of Stress : Not at all  Social Connections: Socially Isolated (11/08/2023)   Social Connection and Isolation Panel [NHANES]    Frequency of Communication with Friends and Family: Three times a week    Frequency of Social Gatherings with Friends and Family: Three times a week    Attends  Religious Services: Never    Active Member of Clubs or Organizations: No    Attends Banker Meetings: Never    Marital Status: Never married  Intimate Partner Violence: Not At Risk (11/08/2023)   Humiliation, Afraid, Rape, and Kick questionnaire    Fear of Current or Ex-Partner: No    Emotionally Abused: No    Physically Abused: No    Sexually Abused: No    FAMILY HISTORY: Family History  Problem Relation Age of Onset   Arthritis Mother    Hypertension Mother    Heart disease Mother        MI age 71   Heart attack Sister    Diabetes Maternal Grandmother    Stomach cancer Paternal Grandmother    Colon cancer Neg Hx    Colon polyps Neg Hx    Esophageal cancer Neg Hx    Rectal cancer Neg Hx     ALLERGIES:  is allergic to citrus.  MEDICATIONS:  Current Outpatient Medications  Medication Sig Dispense Refill   oxyCODONE  10 MG TABS Take 1 tablet (10 mg total) by mouth every 4 (four) hours for 14 days. 84 tablet 0   albuterol  (VENTOLIN  HFA) 108 (90 Base) MCG/ACT inhaler Inhale 2 puffs into the lungs every 6 (six) hours as needed for wheezing or shortness of breath. 1 each 2   amLODipine  (NORVASC ) 5 MG tablet Take 1 tablet (5 mg total) by mouth daily. 90 tablet 3   aspirin  81 MG chewable tablet Chew 1 tablet (81 mg total) by mouth daily. 90 tablet 3   BIKTARVY  30-120-15  MG TABS TAKE 1 TABLET BY MOUTH EVERY DAY 30 tablet 11   Blood Pressure KIT 1 kit by Does not apply route daily. 1 kit 0   diazepam  (VALIUM ) 5 MG tablet Take 1 tablet (5 mg total) by mouth every 6 (six) hours as needed for anxiety. (Patient not taking: Reported on 03/20/2024) 2 tablet 0   diclofenac  Sodium (VOLTAREN  ARTHRITIS PAIN) 1 % GEL Apply 2 g topically 4 (four) times daily. 100 g 3   DULoxetine  (CYMBALTA ) 60 MG capsule Take 1 capsule (60 mg total) by mouth daily. For chronic pain 90 capsule 1   fluconazole  (DIFLUCAN ) 200 MG tablet Take 1 tablet (200 mg total) by mouth daily. (Patient not taking: Reported on 03/20/2024) 30 tablet 0   hydrocortisone  2.5 % ointment Apply topically 2 (two) times daily. Apply around rectal area. 30 g 2   lidocaine  (XYLOCAINE ) 5 % ointment Apply 1 Application topically 4 (four) times daily as needed for up to 5 days. 20 g 0   meloxicam  (MOBIC ) 15 MG tablet Take 1 tablet (15 mg total) by mouth daily. 30 tablet 1   methocarbamol  (ROBAXIN ) 500 MG tablet Take 2 tablets (1,000 mg total) by mouth every 8 (eight) hours as needed. 90 tablet 1   methylPREDNISolone  (MEDROL  DOSEPAK) 4 MG TBPK tablet Take per packet instructions 21 tablet 0   metoprolol  succinate (TOPROL -XL) 25 MG 24 hr tablet TAKE 1/2 TABLET BY MOUTH DAILY 45 tablet 0   nitroGLYCERIN  (NITROSTAT ) 0.4 MG SL tablet Place 1 tablet (0.4 mg total) under the tongue every 5 (five) minutes as needed for chest pain (trouble swallowing). 25 tablet 1   rosuvastatin  (CRESTOR ) 40 MG tablet Take 1 tablet (40 mg total) by mouth daily. 90 tablet 3   No current facility-administered medications for this visit.    REVIEW OF SYSTEMS:   All other systems are reviewed and are negative  for acute change except as noted in the HPI.  PHYSICAL EXAMINATION: ECOG PERFORMANCE STATUS: 1 - Symptomatic but completely ambulatory  Vitals:   03/21/24 1200  BP: 137/72  Pulse: 78  Resp: 19  Temp: 97.9 F (36.6 C)  SpO2: 100%   Filed  Weights   03/21/24 1200  Weight: 202 lb (91.6 kg)    Physical Exam Vitals reviewed.  Constitutional:      Appearance: He is not ill-appearing or toxic-appearing.  HENT:     Head: Normocephalic.     Nose: Nose normal.  Eyes:     General: No scleral icterus.    Conjunctiva/sclera: Conjunctivae normal.  Cardiovascular:     Rate and Rhythm: Normal rate and regular rhythm.     Pulses: Normal pulses.          Radial pulses are 2+ on the left side.     Heart sounds: Normal heart sounds.  Pulmonary:     Effort: Pulmonary effort is normal.     Breath sounds: Normal breath sounds.  Abdominal:     General: There is no distension.  Musculoskeletal:     Cervical back: Normal range of motion. No rigidity or tenderness.     Comments: Decreased ROM of left upper extremity secondary to pain. No gross deformity. Bony tenderness of left shoulder and elbow.  Skin:    General: Skin is warm.     Capillary Refill: Capillary refill takes less than 2 seconds.     Comments: Equal tactile temperature of bilateral upper extremities  Psychiatric:        Mood and Affect: Mood normal.       LABORATORY DATA:  I have reviewed the data as listed    Latest Ref Rng & Units 03/21/2024    2:04 PM 03/23/2023    4:17 PM 10/29/2021    2:45 AM  CBC  WBC 4.0 - 10.5 K/uL 3.9  4.5  7.5   Hemoglobin 13.0 - 17.0 g/dL 16.1  09.6  04.5   Hematocrit 39.0 - 52.0 % 42.3  42.8  47.3   Platelets 150 - 400 K/uL 185  194  189        Latest Ref Rng & Units 03/21/2024    2:04 PM 01/05/2024    2:21 PM 03/23/2023    4:17 PM  CMP  Glucose 70 - 99 mg/dL 70  64  82   BUN 6 - 20 mg/dL 9  16  9    Creatinine 0.61 - 1.24 mg/dL 4.09  8.11  9.14   Sodium 135 - 145 mmol/L 139  139  143   Potassium 3.5 - 5.1 mmol/L 3.6  3.7  3.0   Chloride 98 - 111 mmol/L 102  103  101   CO2 22 - 32 mmol/L 31  23  30    Calcium  8.9 - 10.3 mg/dL 9.7  9.0  9.4   Total Protein 6.5 - 8.1 g/dL 7.8   7.1   Total Bilirubin 0.0 - 1.2 mg/dL 0.5   0.4    Alkaline Phos 38 - 126 U/L 83   84   AST 15 - 41 U/L 14   14   ALT 0 - 44 U/L 9   8      RADIOGRAPHIC STUDIES: I have personally reviewed the radiological images as listed and agreed with the findings in the report. DG Shoulder Left Result Date: 03/19/2024 CLINICAL DATA:  Pain. EXAM: LEFT SHOULDER - 3 VIEW COMPARISON:  11/08/2023. FINDINGS: No  acute fracture, dislocation or subluxation. There is an osteolytic lesion with ill-defined margins consistent with a neoplastic process involving the proximal humerus. 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 No acute fracture. Electronically Signed   By: Sydell Eva M.D.   On: 03/19/2024 12:09    ASSESSMENT & PLAN Benjamin Herrera is a 58 y.o. male presenting [facility] for consultation regarding bone lesion. Patient will proceed with laboratory workup today.   #Bone lesion - Reviewed ED x-ray with patient. He plans to call and schedule MRI of left shoulder as ordered by Dr. Guyann Leitz. - Pain is uncontrolled, significant affecting ADLs. Will send patient a 2 week prescription for oxycodone  10 mg in attempt to bette control pain as 5 mg has been ineffective. I have reviewed the PDMP during this encounter. Patient also given a dose of PO pain medicine in clinic. - CT CAP ordered for further evaluation. CT results will help determine appropriate biopsy site. Patient prefers for work up to be completed locally.  - Labs collected today include CBC, CMP, LDH, multiple myeloma panel, serum free light chains.  #Age related screenings - Will check PSA today. - Colorectal screening UTD.  -Patient will RTC when work up is complete.  Patient expressed understanding of the recommended workup and is agreeable to move forward.   All questions were answered. The patient knows to call the clinic with any problems, questions or concerns.  Shared visit with Dr. Rosaline Coma  Orders Placed This Encounter  Procedures   CT  CHEST ABDOMEN PELVIS W CONTRAST    Standing Status:   Future    Expected Date:   03/22/2024    Expiration Date:   03/21/2025    If indicated for the ordered procedure, I authorize the administration of contrast media per Radiology protocol:   Yes    Does the patient have a contrast media/X-ray dye allergy?:   No    Preferred imaging location?:   [facility]    If indicated for the ordered procedure, I authorize the administration of oral contrast media per Radiology protocol:   Yes   CBC with Differential (Cancer Center Only)   CMP (Cancer Center only)   Kappa/lambda light chains   Lactate dehydrogenase   Multiple Myeloma Panel (SPEP&IFE w/QIG)   Prostate-Specific AG, Serum    Standing Status:   Future    Number of Occurrences:   1    Expected Date:   03/21/2024    Expiration Date:   03/21/2025      I have spent a total of 60 minutes minutes of face-to-face and non-face-to-face time, preparing to see the patient, obtaining and/or reviewing separately obtained history, performing a medically appropriate examination, counseling and educating the patient, ordering medications/tests/procedures, referring and communicating with other health care professionals, documenting clinical information in the electronic health record, independently interpreting results and communicating results to the patient, and care coordination.   Dayvon Dax Walisiewicz PA-C Department of Hematology/Oncology [facility] Phone: [phone]  I have read the above note and personally examined the patient. I agree with the assessment and plan as noted above.  Briefly Mr. Gearold Wainer is a 58 year old male with medical history significant for polysubstance abuse and HIV who presents for evaluation of a lytic lesion of his humerus.  The patient had been having shoulder pain for approximately 3 months time.  He recently had imaging done on 03/19/2024 which showed a proximal humeral  lytic lesion,  concern for neoplasm.  At this time differential is broad and includes multiple myeloma versus prostate cancer.  Other solid malignancies would certainly be a consideration.  Will order full multiple myeloma blood labs as well as a PSA.  Would also recommend full CT chest abdomen pelvis to determine the extent of metastatic disease to the bones and possible sources of biopsy for primary lesion.  The patient voiced understanding of our findings and plan moving forward.  We will assist him with pain control until we are able to reach the diagnosis.  It is likely he will need to receive radiation therapy regardless of the type of malignancy causing the humeral lesion.   Rogerio Clay, MD Department of Hematology/Oncology [facility] Phone: [phone] Pager: [phone] Email: [email or web address]

## 2024-03-20 NOTE — Progress Notes (Unsigned)
 Established Patient Office Visit  Subjective   Patient ID: Benjamin Herrera, male    DOB: Jul 16, 1966  Age: 58 y.o. MRN: [id number]  Chief Complaint  Patient presents with  . Home Health Aid needed, paperwork completion    Discussed the use of AI scribe software for clinical note transcription with the patient, who gave verbal consent to proceed.  History of Present Illness   Benjamin Herrera is a 58 year old male who presents for assistance with obtaining a home health aide.  He experiences severe shoulder pain, initially attributed to arthritis but later identified as a tumor. The pain is debilitating, affecting his ability to perform daily activities such as bathing and dressing. He wears a sling for support. He seeks a home health aide to assist with personal care tasks due to these limitations. He is scheduled to visit the cancer center tomorrow to discuss management options, including surgery, and wants to ensure home health aide services are in place for post-hospitalization support.  ED visit 03/19/24   Problem List / ED Course / Critical interventions / Medication management   Patient presents to ED concern for acute on chronic left shoulder pain. Physical exam with tenderness to palpation of lateral proximal humeral head. Rest of physical exam reassuring. Patient stating that lidocaine  patch helped his pain feel better. I ordered imaging studies including left shoulder xray. I independently visualized and interpreted imaging. I agree with the radiologist interpretation of proximal humeral lytic lesion.  Shared results with patient.  Answered all questions. I requested consultation with the orthopedic on-call provider Dr. Guyann Leitz,  and discussed lab and imaging findings as well as pertinent plan - they recommend: outpatient follow up. Dr. Guyann Leitz has already referred patient to specialty ortho/oncology provider with [facility]. Dr. Guyann Leitz will be following up with patient to make sure  referral happens.  Talked with patient about this.  Patient stating that he has not yet heard back from the oncologist to establish an appointment.  I suspect that this is due to the recent holiday.  Patient agrees to follow-up with Dr. Guyann Leitz and possibly reach out to the oncologist to establish an appointment. Patient asking for prescription for the lidocaine  patches since they help with this pain. I have sent this prescription in.  Educated patient on alternating Advil  and Tylenol  for pain control.  Will prescribe 5mg  oxycodone  for breakthrough pain. I have reviewed the patients home medicines and have made adjustments as needed The patient has been appropriately medically screened and/or stabilized in the ED. I have low suspicion for any other emergent medical condition which would require further screening, evaluation or treatment in the ED or require inpatient management. At time of discharge the patient is hemodynamically stable and in no acute distress. I have discussed work-up results and diagnosis with patient and answered all questions. Patient is agreeable with discharge plan. We discussed strict return precautions for returning to the emergency department and they verbalized understanding.        HPI  Past Medical History:  Diagnosis Date  . Achalasia and cardiospasm   . Allergy   . Arthritis    back ,shoulders   . Asthma   . Boils    under arms hx of  . CAD (coronary artery disease)    a. 09/2016: cath showing 100% RCA occlusion with collaterals and 90% OM1 (treated with DES)  . Candidiasis of mouth   . Candidiasis of unspecified site   . Cholelithiasis   .  Cocaine abuse (HCC)    Quit 09/2016  . Condyloma acuminatum in male of scrotum & anal canal s/p laser ablation 07/03/2012  . Coronary artery disease   . Dysphagia   . Fracture, humerus   . Frozen shoulder    left  . GERD (gastroesophageal reflux disease)   . Heart attack (HCC)   . Hemorrhoids   . Hepatitis 1993   A   . Herpes labialis   . HIV (human immunodeficiency virus infection) (HCC) DX 1993  . Hypertension Dx 2014   off all bp meds for last 9 or 10 months  . Pilonidal disease 01/10/2012  . Polysubstance abuse (HCC)   . Psoriasis    hx of  . Sleep apnea    does  have CPAP  . 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 Marital status: Single    Spouse name: Not on file  . Number of children: 0  . Years of education: Not on file  . Highest education level: Not on file  Occupational History  . Occupation: Disabled  Tobacco Use  . Smoking status: Every Day    Current packs/day: 0.50    Average packs/day: 0.4 packs/day for 50.5 years (19.0 ttl pk-yrs)    Types: Cigarettes    Start date: 10/04/1998  . Smokeless tobacco: Never  . Tobacco comments:    4 or 5 cigarettes a day   Vaping Use  . Vaping status: Never Used  Substance and Sexual Activity  . Alcohol use: No    Alcohol/week: 0.0 standard drinks of alcohol  . Drug use: Yes    Frequency: 3.0 times per week    Types: Marijuana, Cocaine  . Sexual activity: Not Currently    Partners: Male    Comment: pt. given condoms, used marijuana -3 days ago 05/14/16  Other Topics Concern  . Not on file  Social History Narrative   Pt lives in a ground floor apartment in [place].   Social Drivers of Health   Financial Resource Strain: Low Risk  (11/08/2023)   Overall Financial Resource Strain (CARDIA)   . Difficulty of Paying Living Expenses: Not very hard  Food Insecurity: Food Insecurity Present (11/08/2023)   Hunger Vital Sign   . Worried About Programme researcher, broadcasting/film/video in the Last Year: Sometimes true   . Ran Out of Food in the Last Year: Often true  Transportation Needs: No Transportation Needs (11/08/2023)   PRAPARE - Transportation   . Lack of Transportation (Medical): No   . Lack  of Transportation (Non-Medical): No  Physical Activity: Inactive (11/08/2023)   Exercise Vital Sign   . Days of Exercise per Week: 0 days   . Minutes of Exercise per Session: 0 min  Stress: No Stress Concern Present (11/08/2023)   [employer] of Occupational Health - Occupational Stress Questionnaire   . Feeling of Stress : Not at all  Social Connections: Socially Isolated (11/08/2023)   Social Connection and Isolation Panel [NHANES]   . Frequency of Communication with Friends and Family: Three times a week   . Frequency of Social Gatherings with Friends and Family: Three times a week   . Attends Religious Services: Never   . Active Member of Clubs or Organizations: No   . Attends Banker Meetings: Never   . Marital Status: Never married  Intimate Partner Violence: Not At Risk (11/08/2023)   Humiliation, Afraid, Rape, and Kick questionnaire   . Fear of Current or Ex-Partner: No   . Emotionally Abused: No   . Physically Abused: No   . Sexually Abused: No   Family History  Problem Relation Age of Onset  . Arthritis Mother   . Hypertension Mother   . Heart disease Mother        MI age 32  . Heart attack Sister   . Diabetes Maternal Grandmother   . Stomach cancer Paternal Grandmother   . Colon cancer Neg Hx   . Colon polyps Neg Hx   . Esophageal cancer Neg Hx   . Rectal cancer Neg Hx    Allergies  Allergen Reactions  . Citrus Swelling    Mouth itching and swelling     ROS    Objective:     BP (!) 144/80 (BP Location: Right Arm, Patient Position: Sitting, Cuff Size: Large)   Pulse 73   Ht 6' (1.829 m)   Wt 201 lb (91.2 kg)   BMI 27.26 kg/m  BP Readings from Last 3 Encounters:  03/20/24 (!) 144/80  03/19/24 123/65  01/31/24 110/63   Wt Readings from Last 3 Encounters:  03/20/24 201 lb (91.2 kg)  03/19/24 205 lb (93 kg)  01/31/24 197 lb (89.4 kg)    Physical Exam     Assessment & Plan:   Problem List Items Addressed This Visit    None   No follow-ups on file.    Etter Hermann Mayers, PA-C

## 2024-03-21 ENCOUNTER — Encounter: Payer: Self-pay | Admitting: Physician Assistant

## 2024-03-21 ENCOUNTER — Encounter: Payer: Self-pay | Admitting: Medical Oncology

## 2024-03-21 ENCOUNTER — Inpatient Hospital Stay

## 2024-03-21 ENCOUNTER — Inpatient Hospital Stay: Attending: Physician Assistant

## 2024-03-21 ENCOUNTER — Inpatient Hospital Stay (HOSPITAL_BASED_OUTPATIENT_CLINIC_OR_DEPARTMENT_OTHER): Admitting: Physician Assistant

## 2024-03-21 VITALS — BP 137/72 | HR 78 | Temp 97.9°F | Resp 19 | Ht 72.0 in | Wt 202.0 lb

## 2024-03-21 DIAGNOSIS — M899 Disorder of bone, unspecified: Secondary | ICD-10-CM

## 2024-03-21 DIAGNOSIS — I10 Essential (primary) hypertension: Secondary | ICD-10-CM | POA: Insufficient documentation

## 2024-03-21 DIAGNOSIS — Z8042 Family history of malignant neoplasm of prostate: Secondary | ICD-10-CM | POA: Diagnosis not present

## 2024-03-21 DIAGNOSIS — M79603 Pain in arm, unspecified: Secondary | ICD-10-CM | POA: Diagnosis not present

## 2024-03-21 DIAGNOSIS — Z125 Encounter for screening for malignant neoplasm of prostate: Secondary | ICD-10-CM | POA: Insufficient documentation

## 2024-03-21 DIAGNOSIS — I2510 Atherosclerotic heart disease of native coronary artery without angina pectoris: Secondary | ICD-10-CM | POA: Insufficient documentation

## 2024-03-21 DIAGNOSIS — G4730 Sleep apnea, unspecified: Secondary | ICD-10-CM | POA: Diagnosis not present

## 2024-03-21 DIAGNOSIS — Z86007 Personal history of in-situ neoplasm of skin: Secondary | ICD-10-CM | POA: Diagnosis not present

## 2024-03-21 DIAGNOSIS — F17210 Nicotine dependence, cigarettes, uncomplicated: Secondary | ICD-10-CM | POA: Diagnosis not present

## 2024-03-21 DIAGNOSIS — Z800 Family history of malignant neoplasm of digestive organs: Secondary | ICD-10-CM | POA: Insufficient documentation

## 2024-03-21 LAB — CMP (CANCER CENTER ONLY)
ALT: 9 U/L (ref 0–44)
AST: 14 U/L — ABNORMAL LOW (ref 15–41)
Albumin: 4.2 g/dL (ref 3.5–5.0)
Alkaline Phosphatase: 83 U/L (ref 38–126)
Anion gap: 6 (ref 5–15)
BUN: 9 mg/dL (ref 6–20)
CO2: 31 mmol/L (ref 22–32)
Calcium: 9.7 mg/dL (ref 8.9–10.3)
Chloride: 102 mmol/L (ref 98–111)
Creatinine: 0.91 mg/dL (ref 0.61–1.24)
GFR, Estimated: >60 mL/min (ref >60)
Glucose, Bld: 70 mg/dL (ref 70–99)
Potassium: 3.6 mmol/L (ref 3.5–5.1)
Sodium: 139 mmol/L (ref 135–145)
Total Bilirubin: 0.5 mg/dL (ref 0.0–1.2)
Total Protein: 7.8 g/dL (ref 6.5–8.1)

## 2024-03-21 LAB — CBC WITH DIFFERENTIAL (CANCER CENTER ONLY)
Abs Immature Granulocytes: 0.01 10*3/uL (ref 0.00–0.07)
Basophils Absolute: 0 10*3/uL (ref 0.0–0.1)
Basophils Relative: 1 %
Eosinophils Absolute: 0.1 10*3/uL (ref 0.0–0.5)
Eosinophils Relative: 2 %
HCT: 42.3 % (ref 39.0–52.0)
Hemoglobin: 13.6 g/dL (ref 13.0–17.0)
Immature Granulocytes: 0 %
Lymphocytes Relative: 37 %
Lymphs Abs: 1.5 10*3/uL (ref 0.7–4.0)
MCH: 25.1 pg — ABNORMAL LOW (ref 26.0–34.0)
MCHC: 32.2 g/dL (ref 30.0–36.0)
MCV: 78 fL — ABNORMAL LOW (ref 80.0–100.0)
Monocytes Absolute: 0.3 10*3/uL (ref 0.1–1.0)
Monocytes Relative: 6 %
Neutro Abs: 2.1 10*3/uL (ref 1.7–7.7)
Neutrophils Relative %: 54 %
Platelet Count: 185 10*3/uL (ref 150–400)
RBC: 5.42 MIL/uL (ref 4.22–5.81)
RDW: 15.8 % — ABNORMAL HIGH (ref 11.5–15.5)
WBC Count: 3.9 10*3/uL — ABNORMAL LOW (ref 4.0–10.5)
nRBC: 0 % (ref 0.0–0.2)

## 2024-03-21 LAB — LACTATE DEHYDROGENASE: LDH: 162 U/L (ref 98–192)

## 2024-03-21 MED ORDER — OXYCODONE HCL 10 MG PO TABS: 10.0000 mg | ORAL_TABLET | ORAL | 0 refills | Status: AC

## 2024-03-21 MED ORDER — OXYCODONE HCL 5 MG PO TABS
10.0000 mg | ORAL_TABLET | Freq: Once | ORAL | Status: AC
  Administered 2024-03-21: 10 mg via ORAL
  Filled 2024-03-21: qty 2

## 2024-03-21 MED ORDER — ACETAMINOPHEN 325 MG PO TABS
325.0000 mg | ORAL_TABLET | Freq: Once | ORAL | Status: AC
  Administered 2024-03-21: 325 mg via ORAL
  Filled 2024-03-21: qty 1

## 2024-03-21 NOTE — Progress Notes (Signed)
 [facility]   Patient presented to clinic by himself for his scheduled appointment with Devonda Folds I introduced myself and provided him with my direct contact information. Patient was also provided with the phone number for scheduling his MRI, ordered by Dr. Tommy Frames office. Patient confirmed that he wall schedule his MRI. Patient was encouraged to call me with any questions/concerns he may have.  Esperanza Hedges, RN, BSN, [facility] Oncology Nurse Navigator, [facility] 03/21/2024 2:54 PM

## 2024-03-21 NOTE — Patient Instructions (Addendum)
 Diagnostic Clinic Office Visit Discharge Information and Instructions  Thank you for choosing [facility] for your healthcare needs.  Below is a summary of today's discussion, along with our contact information and an outline of what to expect next.  Reason for Visit:  bone lesion  Proposed Diagnostic Care Plan: Labs collected today CT CAP ordered. Waiting on authorization. Once approved we will call to let you know. The number to schedule the CT once it is approved is [phone]. Pain medication sent to the pharmacy. My nurse is working on Scientist, water quality authorization so you have won't have any issues picking it up.   What to Expect: - Generally, when lab tests are ordered the results can take up to 1 week for results to be available.  At that point, we will contact you to discuss your results with you.  Unless there is a critical result, we will typically wait for all of your lab results to be available before contacting you. - If a biopsy is part of your Care Plan, those results can take on average 7-10 days to result.  Once results are available, we will contact you to discuss your pathology results and any next steps. - If you have additional imaging ordered, such as a CT Scan, MRI, Ultrasound, Bone Scan, or PET scan, your imaging will need to be authorized then scheduled with the earliest available appointment.  You may be asked to travel to another hospital within [facility] who has a sooner availability, please consider doing so if asked. - If you use MyChart, your results will be available to you in the MyChart portal.  Your provider will be in touch with you as soon as all of your results are available to be discussed.  Your Diagnostic Clinic Provider:  Malavika Lira Walisiewicz PA-C and Dr. Rosaline Coma Your Diagnostic Navigator:  Jetta Morrow RN, office number [phone]  If you or your caregiver have number blocking on your cell phones, please ensure the cancer center's numbers  are not blocked.  If you are not a registered MyChart user, please consider enrolling in MyChart to receive your test results and visit notes.  You can also access your discharge instructions electronically.  MyChart also gives you an electronic means to communicate with your Care Team instead of needing to call in to the cancer center.  We appreciate you trusting us  with your healthcare and look forward to partnering with you as we work to uncover what your potential diagnosis may be.  Please do not hesitate to reach out at any point with questions or concerns.

## 2024-03-22 LAB — KAPPA/LAMBDA LIGHT CHAINS
Kappa free light chain: 63.9 mg/L — ABNORMAL HIGH (ref 3.3–19.4)
Kappa, lambda light chain ratio: 1.15 (ref 0.26–1.65)
Lambda free light chains: 55.8 mg/L — ABNORMAL HIGH (ref 5.7–26.3)

## 2024-03-22 LAB — PROSTATE-SPECIFIC AG, SERUM (LABCORP): Prostate Specific Ag, Serum: 0.5 ng/mL (ref 0.0–4.0)

## 2024-03-22 NOTE — Patient Instructions (Signed)
 Paperwork was submitted on your behalf.  Please let us  know if there is anything else we can do for you.

## 2024-03-22 NOTE — Telephone Encounter (Signed)
 Signed PCS request and visit notes efaxed to [employer]

## 2024-03-23 LAB — MULTIPLE MYELOMA PANEL, SERUM
Albumin SerPl Elph-Mcnc: 3.5 g/dL (ref 2.9–4.4)
Albumin/Glob SerPl: 1 (ref 0.7–1.7)
Alpha 1: 0.3 g/dL (ref 0.0–0.4)
Alpha2 Glob SerPl Elph-Mcnc: 0.9 g/dL (ref 0.4–1.0)
B-Globulin SerPl Elph-Mcnc: 1.1 g/dL (ref 0.7–1.3)
Gamma Glob SerPl Elph-Mcnc: 1.4 g/dL (ref 0.4–1.8)
Globulin, Total: 3.7 g/dL (ref 2.2–3.9)
IgA: 319 mg/dL (ref 90–386)
IgG (Immunoglobin G), Serum: 1500 mg/dL (ref 603–1613)
IgM (Immunoglobulin M), Srm: 98 mg/dL (ref 20–172)
Total Protein ELP: 7.2 g/dL (ref 6.0–8.5)

## 2024-03-26 ENCOUNTER — Telehealth: Payer: Self-pay | Admitting: Physician Assistant

## 2024-03-26 ENCOUNTER — Ambulatory Visit (HOSPITAL_COMMUNITY)
Admission: RE | Admit: 2024-03-26 | Discharge: 2024-03-26 | Disposition: A | Discharge status: Home / Self Care | Source: Ambulatory Visit | Attending: Physician Assistant | Admitting: Physician Assistant

## 2024-03-26 ENCOUNTER — Encounter: Payer: Self-pay | Admitting: Medical Oncology

## 2024-03-26 DIAGNOSIS — M899 Disorder of bone, unspecified: Secondary | ICD-10-CM | POA: Insufficient documentation

## 2024-03-26 DIAGNOSIS — R590 Localized enlarged lymph nodes: Secondary | ICD-10-CM | POA: Diagnosis not present

## 2024-03-26 DIAGNOSIS — J439 Emphysema, unspecified: Secondary | ICD-10-CM | POA: Diagnosis not present

## 2024-03-26 DIAGNOSIS — R918 Other nonspecific abnormal finding of lung field: Secondary | ICD-10-CM

## 2024-03-26 DIAGNOSIS — K8020 Calculus of gallbladder without cholecystitis without obstruction: Secondary | ICD-10-CM | POA: Diagnosis not present

## 2024-03-26 MED ORDER — IOHEXOL 300 MG/ML  SOLN
100.0000 mL | Freq: Once | INTRAMUSCULAR | Status: AC | PRN
  Administered 2024-03-26: 100 mL via INTRAVENOUS

## 2024-03-26 NOTE — Telephone Encounter (Signed)
 I notified Benjamin Herrera by phone regarding CT CAP and lab results. Labs are not suggestive of multiple myeloma. CT CAP shows a spiculated mass of the medial anterior right pulmonary apex with a large interface to the adjacent pleura. There is also enlarged pretracheal and right hilar lymph nodes in addition to the lytic lesion of his left proximal humerus concerning for metastatic disease. Discussed results with Dr. Rosaline Coma who agrees with plan for urgent pulmonology referral for biopsy.Patient is scheduled for an MRI of his left shoulder ordered by orthopedist Dr. Guyann Leitz. With the new suspicion for lung cancer the MRI is not indicated from an oncology standpoint. I will reach out orthopedist to confirm.  All of patient's questions were answered and he expressed understanding of the plan provided.

## 2024-03-28 ENCOUNTER — Telehealth: Payer: Self-pay

## 2024-03-28 NOTE — Telephone Encounter (Signed)
 Copied from CRM [id number]. Topic: General - Other >> Mar 28, 2024  8:43 AM Essie A wrote: Reason for CRM: Patient visited the [facility] on 03/20/24.  He submitted some paperwork and is now wanting to know who has copies and if it was submitted to whomever it was supposed to go to.  Please return his call at [phone].

## 2024-03-30 ENCOUNTER — Ambulatory Visit
Admission: RE | Admit: 2024-03-30 | Discharge: 2024-03-30 | Disposition: A | Discharge status: Home / Self Care | Source: Ambulatory Visit | Attending: Orthopedic Surgery | Admitting: Orthopedic Surgery

## 2024-03-30 DIAGNOSIS — M7592 Shoulder lesion, unspecified, left shoulder: Secondary | ICD-10-CM

## 2024-03-30 DIAGNOSIS — M89512 Osteolysis, left shoulder: Secondary | ICD-10-CM

## 2024-03-30 MED ORDER — GADOPICLENOL 0.5 MMOL/ML IV SOLN
9.0000 mL | Freq: Once | INTRAVENOUS | Status: AC | PRN
  Administered 2024-03-30: 9 mL via INTRAVENOUS

## 2024-04-01 ENCOUNTER — Other Ambulatory Visit (HOSPITAL_BASED_OUTPATIENT_CLINIC_OR_DEPARTMENT_OTHER): Payer: Self-pay | Admitting: Family

## 2024-04-01 DIAGNOSIS — I10 Essential (primary) hypertension: Secondary | ICD-10-CM

## 2024-04-01 DIAGNOSIS — I25118 Atherosclerotic heart disease of native coronary artery with other forms of angina pectoris: Secondary | ICD-10-CM

## 2024-04-02 ENCOUNTER — Ambulatory Visit: Admitting: Acute Care

## 2024-04-02 ENCOUNTER — Encounter (HOSPITAL_COMMUNITY): Payer: Self-pay | Admitting: Emergency Medicine

## 2024-04-02 ENCOUNTER — Other Ambulatory Visit: Payer: Self-pay

## 2024-04-02 ENCOUNTER — Encounter: Payer: Self-pay | Admitting: Acute Care

## 2024-04-02 ENCOUNTER — Encounter: Payer: Self-pay | Admitting: Emergency Medicine

## 2024-04-02 VITALS — BP 130/87 | HR 100 | Ht 73.0 in | Wt 191.2 lb

## 2024-04-02 DIAGNOSIS — M899 Disorder of bone, unspecified: Secondary | ICD-10-CM

## 2024-04-02 DIAGNOSIS — R591 Generalized enlarged lymph nodes: Secondary | ICD-10-CM | POA: Diagnosis not present

## 2024-04-02 DIAGNOSIS — R918 Other nonspecific abnormal finding of lung field: Secondary | ICD-10-CM | POA: Diagnosis not present

## 2024-04-02 DIAGNOSIS — R911 Solitary pulmonary nodule: Secondary | ICD-10-CM | POA: Diagnosis not present

## 2024-04-02 NOTE — H&P (View-Only) (Signed)
 History of Present Illness Benjamin Herrera is a 58 y.o. male current every day smoker referred 03/2024 for evaluation of a lung nodule and lymphadenopathy. He will be followed by Dr. Baldwin Levee.   PMH includes OA, non compliant with CPAP, asthma, CAD, adhesive capitus, GERD, HIV, polysubstance abuse , left shoulder pain  Pt. Has consented to use of [employer] soft wear to help capture the content of this OV .  04/02/2024 Pt. Presents for consult after CT imaging revealed a Large mass replacing the proximal humeral metaphysis diaphysis with destruction of the cortex, consistent with metastatic disease in patient with history of lung mass.  He complains of  severe, debilitating pain in his left arm.A CT scan revealed a lesion on his left arm, and he is scheduled to see an orthopedic specialist on June 29. An MRI of his shoulder has also been performed.  During a recent emergency room visit at [facility], a CT scan of the chest, abdomen, and pelvis revealed a lung nodule and a lesion on his left arm. There is concern that this may be a primary lung cancer that has metastasized to his arm. There are enlarged lymph nodes concerning for nodal metastasis. He has not had a PET scan.   He was referred to pulmonary for further evaluation of his lung nodule.  He denies and hemoptysis, but does endorse  weight loss. He has lost 6 lbs since April.   He has a significant smoking history, having smoked for 41 years since the age of 89, with a pack-year history of approximately 42 packs. He recently quit smoking about a week ago after the results of the CT Chest.   His family history includes stomach cancer in his grandmother and prostate cancer in his uncle. He has a history of high blood pressure and sleep apnea, for which he owns a CPAP machine but does not use it. No current use of blood thinners or latex allergy.  We have reviewed his CT. I am expediting his bronchoscopy for tomorrow. He will need PET and MRI  brain if biopsies are positive for malignancy. He will need urgent referral for oncology and should see rad onc to see if we can get some pain control for his shoulder. Of note, he was prescribed Oxycodone  on 03/21/2024. 1 tablet (10 mg total) by mouth every 4 (four) hours for 14 days . A total of 84 tablets was dispensed. He should,have had enough medication to get him through 04/04/2024.   I am very concerned about getting this patient diagnosed and moved on to treatment. I am going to get him on the schedule for a bronchoscopy tomorrow 04/03/2024 , and then get PET and MRI brain ordered for staging if PET scan is positive. He will most likely need referral to rad onc for the humerus lesion for pain management as well as treatment. We will work to get him added to the schedule for tomorrow.   Test Results: 03/26/2024 CT Chest , Abdomen, Pelvis Mediastinum/Nodes: Enlarged pretracheal and right hilar lymph nodes, right hilar nodes measuring up to 2.7 x 2.0 cm (series 2, image 27). The esophagus is filled with fluid and radiopaque ingested material (series 2, image 34). Thyroid and trachea without significant findings.   Lungs/Pleura: Mild, predominantly paraseptal emphysema. Spiculated mass of the medial anterior right apex with a large interface to the adjacent pleura measuring 4.1 x 3.0 cm (series 6, image 32). No pleural effusion or pneumothorax.   Musculoskeletal: No chest wall  abnormality. Lytic lesion of the proximal left humerus (series 2, image 70).  Spiculated mass of the medial anterior right pulmonary apex with a large interface to the adjacent pleura measuring 4.1 x 3.0 cm, consistent with primary lung malignancy. 2. Enlarged pretracheal and right hilar lymph nodes, consistent with nodal metastatic disease. 3. Lytic lesion of the proximal left humerus, consistent with osseous metastatic disease. 4. No evidence of lymphadenopathy or metastatic disease in the abdomen or pelvis. 5.  Emphysema. 6. Coronary artery disease. 7. Cholelithiasis.     Latest Ref Rng & Units 03/21/2024    2:04 PM 03/23/2023    4:17 PM 10/29/2021    2:45 AM  CBC  WBC 4.0 - 10.5 K/uL 3.9  4.5  7.5   Hemoglobin 13.0 - 17.0 g/dL 09.8  11.9  14.7   Hematocrit 39.0 - 52.0 % 42.3  42.8  47.3   Platelets 150 - 400 K/uL 185  194  189        Latest Ref Rng & Units 03/21/2024    2:04 PM 01/05/2024    2:21 PM 03/23/2023    4:17 PM  BMP  Glucose 70 - 99 mg/dL 70  64  82   BUN 6 - 20 mg/dL 9  16  9    Creatinine 0.61 - 1.24 mg/dL 8.29  5.62  1.30   BUN/Creat Ratio 9 - 20  16  8    Sodium 135 - 145 mmol/L 139  139  143   Potassium 3.5 - 5.1 mmol/L 3.6  3.7  3.0   Chloride 98 - 111 mmol/L 102  103  101   CO2 22 - 32 mmol/L 31  23  30    Calcium  8.9 - 10.3 mg/dL 9.7  9.0  9.4     BNP No results found for: "BNP"  ProBNP No results found for: "PROBNP"  PFT No results found for: "FEV1PRE", "FEV1POST", "FVCPRE", "FVCPOST", "TLC", "DLCOUNC", "PREFEV1FVCRT", "PSTFEV1FVCRT"  MR SHOULDER LEFT W WO CONTRAST Result Date: 03/30/2024 MR SHOULDER WITHOUT THEN WITH IV CONTRAST LEFT COMPARISON: X-ray 03/19/2024 CLINICAL HISTORY: History of cancer with left shoulder pain. PULSE SEQUENCES: Ax PD FS, Sag T2 FS, Cor T1 & COR T2 FS with and without 9 mL of IV contrast. FINDINGS: There is a large destructive mass replacing the proximal humeral metaphysis and diaphysis measuring approximately 4.4 x 4.9 x 6.6 cm in size. There is significant edema and destruction of the cortex and medullary bone. Findings are consistent with a large metastatic lesion in patient with history of lung mass. There is likely a pathologic fracture due to the degree of destruction of bone. Otherwise, limited evaluation of the bony structures are unremarkable. Rotator cuff, biceps tendon and labrum are grossly intact. IMPRESSION: Large mass replacing the proximal humeral metaphysis diaphysis with destruction of the cortex. There is a soft tissue  component surrounding the proximal humerus. Finding is consistent with metastatic disease in patient with history of lung mass. There is either a pathologic fracture or pending pathologic fracture present. Electronically signed by: Adrien Alberta MD 03/30/2024 09:49 AM EDT RP Workstation: [id number]   CT CHEST ABDOMEN PELVIS W CONTRAST Result Date: 03/26/2024 CLINICAL DATA:  Malignancy evaluation, lytic lesion of the left humerus identified by radiographs * Tracking Code: BO * EXAM: CT CHEST, ABDOMEN, AND PELVIS WITH CONTRAST TECHNIQUE: Multidetector CT imaging of the chest, abdomen and pelvis was performed following the standard protocol during bolus administration of intravenous contrast. RADIATION DOSE REDUCTION: This exam was performed according to the  departmental dose-optimization program which includes automated exposure control, adjustment of the mA and/or kV according to patient size and/or use of iterative reconstruction technique. CONTRAST:  OMNIPAQUE  IOHEXOL  300 MG/ML  SOLN COMPARISON:  None Available. FINDINGS: CT CHEST FINDINGS Cardiovascular: Aortic atherosclerosis. Normal heart size. Left coronary artery calcifications. No pericardial effusion. Mediastinum/Nodes: Enlarged pretracheal and right hilar lymph nodes, right hilar nodes measuring up to 2.7 x 2.0 cm (series 2, image 27). The esophagus is filled with fluid and radiopaque ingested material (series 2, image 34). Thyroid and trachea without significant findings. Lungs/Pleura: Mild, predominantly paraseptal emphysema. Spiculated mass of the medial anterior right apex with a large interface to the adjacent pleura measuring 4.1 x 3.0 cm (series 6, image 32). No pleural effusion or pneumothorax. Musculoskeletal: No chest wall abnormality. Lytic lesion of the proximal left humerus (series 2, image 70). CT ABDOMEN PELVIS FINDINGS Hepatobiliary: No solid liver abnormality is seen. Small gallstone. No gallbladder wall thickening, or biliary  dilatation. Pancreas: Unremarkable. No pancreatic ductal dilatation or surrounding inflammatory changes. Spleen: Normal in size without significant abnormality. Adrenals/Urinary Tract: Adrenal glands are unremarkable. Kidneys are normal, without renal calculi, solid lesion, or hydronephrosis. Small diverticulum of the posterior right aspect of the urinary bladder. Stomach/Bowel: Stomach is within normal limits. Appendix appears normal. No evidence of bowel wall thickening, distention, or inflammatory changes. Vascular/Lymphatic: Severe aortic atherosclerosis. No enlarged abdominal or pelvic lymph nodes. Reproductive: No mass or other abnormality. Other: No abdominal wall hernia or abnormality. No ascites. Musculoskeletal: No acute osseous findings. IMPRESSION: 1. Spiculated mass of the medial anterior right pulmonary apex with a large interface to the adjacent pleura measuring 4.1 x 3.0 cm, consistent with primary lung malignancy. 2. Enlarged pretracheal and right hilar lymph nodes, consistent with nodal metastatic disease. 3. Lytic lesion of the proximal left humerus, consistent with osseous metastatic disease. 4. No evidence of lymphadenopathy or metastatic disease in the abdomen or pelvis. 5. Emphysema. 6. Coronary artery disease. 7. Cholelithiasis. These results will be called to the ordering clinician or representative by the Radiologist Assistant, and communication documented in the PACS or [employer]. Aortic Atherosclerosis (ICD10-I70.0) and Emphysema (ICD10-J43.9). Electronically Signed   By: Fredricka Jenny M.D.   On: 03/26/2024 12:41   DG Shoulder Left Result Date: 03/19/2024 CLINICAL DATA:  Pain. EXAM: LEFT SHOULDER - 3 VIEW COMPARISON:  11/08/2023. FINDINGS: No acute fracture, dislocation or subluxation. There is an osteolytic lesion with ill-defined margins consistent with a neoplastic process involving the proximal humerus. 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 No acute  fracture. Electronically Signed   By: Sydell Eva M.D.   On: 03/19/2024 12:09     Past medical hx Past Medical History:  Diagnosis Date   Achalasia and cardiospasm    Allergy    Arthritis    back ,shoulders    Asthma    Boils    under arms hx of   CAD (coronary artery disease)    a. 09/2016: cath showing 100% RCA occlusion with collaterals and 90% OM1 (treated with DES)   Candidiasis of mouth    Candidiasis of unspecified site    Cholelithiasis    Cocaine abuse (HCC)    Quit 09/2016   Condyloma acuminatum in male of scrotum & anal canal s/p laser ablation 07/03/2012   Coronary artery disease    Dysphagia    Fracture, humerus    Frozen shoulder    left   GERD (gastroesophageal reflux disease)    Heart attack (HCC)  Hemorrhoids    Hepatitis 1993   A   Herpes labialis    HIV (human immunodeficiency virus infection) (HCC) DX 1993   Hypertension Dx 2014   off all bp meds for last 9 or 10 months   Pilonidal disease 01/10/2012   Polysubstance abuse (HCC)    Psoriasis    hx of   Sleep apnea    does  have CPAP   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/03/2012   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 His smoking use included cigarettes. He has never used smokeless tobacco. He reports current drug use. Frequency: 3.00 times per week. Drugs: Marijuana and Cocaine.  He reports that he does not drink alcohol.  Tobacco Cessation: Counseling given: Not Answered Tobacco comments: 4 or 5 cigarettes a day  States quit one week ago 04/02/2024 Recently quit smoker with a 42 pack year smoking history Quit 1 week ago on 03/26/2024  Past surgical hx, Family hx, Social hx all reviewed.  Current Outpatient Medications on File Prior to Visit  Medication Sig   albuterol  (VENTOLIN  HFA) 108 (90 Base) MCG/ACT inhaler Inhale 2 puffs into the lungs every 6 (six) hours as needed for wheezing or shortness of breath.   aspirin  81 MG chewable tablet Chew 1 tablet (81 mg total) by mouth daily.   BIKTARVY  30-120-15 MG TABS TAKE 1 TABLET BY MOUTH EVERY DAY   Blood Pressure KIT 1 kit by Does not apply route daily.   diazepam  (VALIUM ) 5 MG tablet Take 1 tablet (5 mg total) by mouth every 6 (six) hours as needed for anxiety.   diclofenac  Sodium (VOLTAREN  ARTHRITIS PAIN) 1 % GEL Apply 2 g topically 4 (four) times daily.   DULoxetine  (CYMBALTA ) 60 MG capsule Take 1 capsule (60 mg total) by mouth daily. For chronic pain   fluconazole  (DIFLUCAN ) 200 MG tablet Take 1 tablet (200 mg total) by mouth daily.   hydrocortisone  2.5 % ointment Apply topically 2 (two) times daily. Apply around rectal area.   meloxicam  (MOBIC ) 15 MG tablet Take 1 tablet (15 mg total) by mouth daily.   methocarbamol  (ROBAXIN ) 500 MG tablet Take 2 tablets (1,000 mg total) by mouth every 8 (eight) hours as needed.   metoprolol  succinate (TOPROL -XL) 25 MG 24 hr tablet TAKE 1/2 TABLET BY MOUTH DAILY   nitroGLYCERIN  (NITROSTAT ) 0.4 MG SL tablet Place 1 tablet (0.4 mg total) under the tongue every 5 (five) minutes as needed for chest pain (trouble swallowing).   oxyCODONE  10 MG TABS Take 1 tablet (10 mg total) by mouth every 4 (four) hours for 14 days.   rosuvastatin  (CRESTOR ) 40 MG tablet Take 1 tablet (40 mg total) by mouth daily.  amLODipine  (NORVASC ) 5 MG tablet Take 1 tablet (5 mg total) by mouth daily.    methylPREDNISolone  (MEDROL  DOSEPAK) 4 MG TBPK tablet Take per packet instructions   No current facility-administered medications on file prior to visit.     Allergies  Allergen Reactions   Citrus Swelling    Mouth itching and swelling     Review Of Systems:  Constitutional:   + weight loss, night sweats,  Fevers, chills, fatigue, or  lassitude.  HEENT:   No headaches,  Difficulty swallowing,  Tooth/dental problems, or  Sore throat,                No sneezing, itching, ear ache, nasal congestion, post nasal drip,   CV:  No chest pain,  Orthopnea, PND, swelling in lower extremities, anasarca, dizziness, palpitations, syncope.   GI  No heartburn, indigestion, abdominal pain, nausea, vomiting, diarrhea, change in bowel habits, loss of appetite, bloody stools.   Resp: No shortness of breath with exertion or at rest.  No excess mucus, no productive cough,  No non-productive cough,  No coughing up of blood.  No change in color of mucus.  No wheezing.  No chest wall deformity  Skin: no rash or lesions.  GU: no dysuria, change in color of urine, no urgency or frequency.  No flank pain, no hematuria   MS:  No joint pain or swelling.  No decreased range of motion.  No back pain., + Shoulder and arm pain  Psych:  No change in mood or affect. No depression or anxiety.  No memory loss, anxious   Vital Signs BP 130/87 (BP Location: Right Arm, Patient Position: Sitting, Cuff Size: Normal)   Pulse 100   Ht 6\' 1"  (1.854 m)   Wt 191 lb 3.2 oz (86.7 kg)   SpO2 96%   BMI 25.23 kg/m    Physical Exam:  General- No distress,  A&Ox3, appropriate ENT: No sinus tenderness, TM clear, pale nasal mucosa, no oral exudate,no post nasal drip, no LAN Cardiac: S1, S2, regular rate and rhythm, no murmur Chest: No wheeze/ rales/ dullness; no accessory muscle use, no nasal flaring, no sternal retractions Abd.: Soft Non-tender, ND, BS +, Body mass index is 25.23 kg/m.  Ext: No clubbing cyanosis, edema,  no obvious deformities Neuro:  normal strength, MAE x 4, A&O x 3, appropriate Skin: No rashes, warm and dry, no obvious skin lesions Psych: normal mood and behavior, anxious and appropriately concerned.    Assessment/Plan Spiculated mass of the medial anterior right pulmonary apex Enlarged pretracheal and right hilar lymph nodes Left Humerus oseous lesion concerning for metastatic lung cancer Recently quit smoker ( 1 week) with 42 pack year smoking history. Plan  I have placed an order for a bronchoscopy with biopsies.  We have discussed the procedure in detail.  We have reviewed the risks and benefits of the procedure. These include bleeding, infection, puncture of the lung, and adverse reaction to anesthesia. You have agreed to proceed with biopsy to evaluate the right lung mass and lymph nodes. Your procedure will be done by Dr. Racheal Buddle. You will receive a letter today with date time and information pertaining to the procedure. You will need someone to drive you to the procedure, stay with you during the procedure, and stay with you after the procedure. You will also need someone to stay with you for 24 hours after anesthesia to ensure you have cleared and are doing well. You will follow-up with me 1 week  after the procedure to review the results and to ensure you are doing well. We will order a PET scan and MR brain at that time if your biopsies are positive for cancer. This will help to complete staging . Call if you need us  prior to the procedure or if you have any questions at all. Please contact office for sooner follow up if symptoms do not improve or worsen or seek emergency care      I spent 35 minutes dedicated to the care of this patient on the date of this encounter to include pre-visit review of records, face-to-face time with the patient discussing conditions above, post visit ordering of testing, clinical documentation with the electronic health record, making  appropriate referrals as documented, and communicating necessary information to the patient's healthcare team.    Raejean Bullock, NP 04/02/2024  9:47 AM

## 2024-04-02 NOTE — Anesthesia Preprocedure Evaluation (Signed)
 Anesthesia Evaluation  Patient identified by MRN, date of birth, ID band Patient awake    Reviewed: Allergy & Precautions, H&P , NPO status , Patient's Chart, lab work & pertinent test results  Airway Mallampati: II  TM Distance: >3 FB Neck ROM: Full    Dental no notable dental hx. (+) Edentulous Upper, Edentulous Lower, Dental Advisory Given   Pulmonary asthma , sleep apnea , Patient abstained from smoking., former smoker   Pulmonary exam normal breath sounds clear to auscultation       Cardiovascular hypertension, Pt. on medications and Pt. on home beta blockers + angina  + CAD, + Past MI and + Cardiac Stents   Rhythm:Regular Rate:Normal     Neuro/Psych   Anxiety Depression    negative neurological ROS     GI/Hepatic Neg liver ROS,GERD  ,,  Endo/Other  negative endocrine ROS    Renal/GU negative Renal ROS  negative genitourinary   Musculoskeletal  (+) Arthritis ,    Abdominal   Peds  Hematology  (+) HIV  Anesthesia Other Findings   Reproductive/Obstetrics negative OB ROS                             Anesthesia Physical Anesthesia Plan  ASA: 3  Anesthesia Plan: General   Post-op Pain Management: Tylenol  PO (pre-op)*   Induction: Intravenous  PONV Risk Score and Plan: 3 and Ondansetron , Dexamethasone, Propofol  infusion and TIVA  Airway Management Planned: Oral ETT  Additional Equipment:   Intra-op Plan:   Post-operative Plan: Extubation in OR  Informed Consent: I have reviewed the patients History and Physical, chart, labs and discussed the procedure including the risks, benefits and alternatives for the proposed anesthesia with the patient or authorized representative who has indicated his/her understanding and acceptance.     Dental advisory given  Plan Discussed with: CRNA  Anesthesia Plan Comments: (PAT note by Rudy Costain, PA-C: 58 y.o. 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   Patient has undergone recent evaluation for acute onset pain and limited range of motion of the left shoulder.  Recent x-ray showing lytic lesions of the left proximal humerus.  CT of the chest abdomen and pelvis 03/26/24 showed a spiculated mass of the medial anterior right pulmonary apex with a large interface to the adjacent pleura as well as enlarged pretracheal and right hilar lymph nodes in addition to the lytic lesion of his left proximal humerus concerning for metastatic disease.  Patient was urgently referred to pulmonology for bronchoscopy and biopsy.  CMP and CBC 03/21/2024 reviewed, unremarkable.  Patient will need to have surgery evaluation.  EKG 03/23/2023: Sinus bradycardia.  Rate 57.  CT chest abdomen pelvis 03/26/2024: IMPRESSION: 1. Spiculated mass of the medial anterior right pulmonary apex with a large interface to the adjacent pleura measuring 4.1 x 3.0 cm, consistent with primary lung malignancy. 2. Enlarged pretracheal and right hilar lymph nodes, consistent with nodal metastatic disease. 3. Lytic lesion of the proximal left humerus, consistent with osseous metastatic disease. 4. No evidence of lymphadenopathy or metastatic disease in the abdomen or pelvis. 5. Emphysema. 6. Coronary artery disease. 7. Cholelithiasis.  Nuclear stress 12/07/2021:   The study  is normal. The study is low risk.   No ST deviation was noted.   LV perfusion is normal. There is no evidence of ischemia. There is no evidence of infarction.   Left ventricular function is normal. Nuclear stress EF: 56 %. The left ventricular ejection fraction is normal (55-65%). End diastolic  cavity size is normal. End systolic cavity size is normal.   Prior study available for comparison from 10/09/2016.  )        Anesthesia Quick Evaluation

## 2024-04-02 NOTE — Progress Notes (Addendum)
 History of Present Illness Benjamin Herrera is a 58 y.o. male current every day smoker referred 03/2024 for evaluation of a lung nodule and lymphadenopathy. He will be followed by Dr. Baldwin Levee.   PMH includes OA, non compliant with CPAP, asthma, CAD, adhesive capitus, GERD, HIV, polysubstance abuse , left shoulder pain  Pt. Has consented to use of [employer] soft wear to help capture the content of this OV .  04/02/2024 Pt. Presents for consult after CT imaging revealed a Large mass replacing the proximal humeral metaphysis diaphysis with destruction of the cortex, consistent with metastatic disease in patient with history of lung mass.  He complains of  severe, debilitating pain in his left arm.A CT scan revealed a lesion on his left arm, and he is scheduled to see an orthopedic specialist on June 29. An MRI of his shoulder has also been performed.  During a recent emergency room visit at [facility], a CT scan of the chest, abdomen, and pelvis revealed a lung nodule and a lesion on his left arm. There is concern that this may be a primary lung cancer that has metastasized to his arm. There are enlarged lymph nodes concerning for nodal metastasis. He has not had a PET scan.   He was referred to pulmonary for further evaluation of his lung nodule.  He denies and hemoptysis, but does endorse  weight loss. He has lost 6 lbs since April.   He has a significant smoking history, having smoked for 41 years since the age of 89, with a pack-year history of approximately 42 packs. He recently quit smoking about a week ago after the results of the CT Chest.   His family history includes stomach cancer in his grandmother and prostate cancer in his uncle. He has a history of high blood pressure and sleep apnea, for which he owns a CPAP machine but does not use it. No current use of blood thinners or latex allergy.  We have reviewed his CT. I am expediting his bronchoscopy for tomorrow. He will need PET and MRI  brain if biopsies are positive for malignancy. He will need urgent referral for oncology and should see rad onc to see if we can get some pain control for his shoulder. Of note, he was prescribed Oxycodone  on 03/21/2024. 1 tablet (10 mg total) by mouth every 4 (four) hours for 14 days . A total of 84 tablets was dispensed. He should,have had enough medication to get him through 04/04/2024.   I am very concerned about getting this patient diagnosed and moved on to treatment. I am going to get him on the schedule for a bronchoscopy tomorrow 04/03/2024 , and then get PET and MRI brain ordered for staging if PET scan is positive. He will most likely need referral to rad onc for the humerus lesion for pain management as well as treatment. We will work to get him added to the schedule for tomorrow.   Test Results: 03/26/2024 CT Chest , Abdomen, Pelvis Mediastinum/Nodes: Enlarged pretracheal and right hilar lymph nodes, right hilar nodes measuring up to 2.7 x 2.0 cm (series 2, image 27). The esophagus is filled with fluid and radiopaque ingested material (series 2, image 34). Thyroid and trachea without significant findings.   Lungs/Pleura: Mild, predominantly paraseptal emphysema. Spiculated mass of the medial anterior right apex with a large interface to the adjacent pleura measuring 4.1 x 3.0 cm (series 6, image 32). No pleural effusion or pneumothorax.   Musculoskeletal: No chest wall  abnormality. Lytic lesion of the proximal left humerus (series 2, image 70).  Spiculated mass of the medial anterior right pulmonary apex with a large interface to the adjacent pleura measuring 4.1 x 3.0 cm, consistent with primary lung malignancy. 2. Enlarged pretracheal and right hilar lymph nodes, consistent with nodal metastatic disease. 3. Lytic lesion of the proximal left humerus, consistent with osseous metastatic disease. 4. No evidence of lymphadenopathy or metastatic disease in the abdomen or pelvis. 5.  Emphysema. 6. Coronary artery disease. 7. Cholelithiasis.     Latest Ref Rng & Units 03/21/2024    2:04 PM 03/23/2023    4:17 PM 10/29/2021    2:45 AM  CBC  WBC 4.0 - 10.5 K/uL 3.9  4.5  7.5   Hemoglobin 13.0 - 17.0 g/dL 09.8  11.9  14.7   Hematocrit 39.0 - 52.0 % 42.3  42.8  47.3   Platelets 150 - 400 K/uL 185  194  189        Latest Ref Rng & Units 03/21/2024    2:04 PM 01/05/2024    2:21 PM 03/23/2023    4:17 PM  BMP  Glucose 70 - 99 mg/dL 70  64  82   BUN 6 - 20 mg/dL 9  16  9    Creatinine 0.61 - 1.24 mg/dL 8.29  5.62  1.30   BUN/Creat Ratio 9 - 20  16  8    Sodium 135 - 145 mmol/L 139  139  143   Potassium 3.5 - 5.1 mmol/L 3.6  3.7  3.0   Chloride 98 - 111 mmol/L 102  103  101   CO2 22 - 32 mmol/L 31  23  30    Calcium  8.9 - 10.3 mg/dL 9.7  9.0  9.4     BNP No results found for: "BNP"  ProBNP No results found for: "PROBNP"  PFT No results found for: "FEV1PRE", "FEV1POST", "FVCPRE", "FVCPOST", "TLC", "DLCOUNC", "PREFEV1FVCRT", "PSTFEV1FVCRT"  MR SHOULDER LEFT W WO CONTRAST Result Date: 03/30/2024 MR SHOULDER WITHOUT THEN WITH IV CONTRAST LEFT COMPARISON: X-ray 03/19/2024 CLINICAL HISTORY: History of cancer with left shoulder pain. PULSE SEQUENCES: Ax PD FS, Sag T2 FS, Cor T1 & COR T2 FS with and without 9 mL of IV contrast. FINDINGS: There is a large destructive mass replacing the proximal humeral metaphysis and diaphysis measuring approximately 4.4 x 4.9 x 6.6 cm in size. There is significant edema and destruction of the cortex and medullary bone. Findings are consistent with a large metastatic lesion in patient with history of lung mass. There is likely a pathologic fracture due to the degree of destruction of bone. Otherwise, limited evaluation of the bony structures are unremarkable. Rotator cuff, biceps tendon and labrum are grossly intact. IMPRESSION: Large mass replacing the proximal humeral metaphysis diaphysis with destruction of the cortex. There is a soft tissue  component surrounding the proximal humerus. Finding is consistent with metastatic disease in patient with history of lung mass. There is either a pathologic fracture or pending pathologic fracture present. Electronically signed by: Adrien Alberta MD 03/30/2024 09:49 AM EDT RP Workstation: [id number]   CT CHEST ABDOMEN PELVIS W CONTRAST Result Date: 03/26/2024 CLINICAL DATA:  Malignancy evaluation, lytic lesion of the left humerus identified by radiographs * Tracking Code: BO * EXAM: CT CHEST, ABDOMEN, AND PELVIS WITH CONTRAST TECHNIQUE: Multidetector CT imaging of the chest, abdomen and pelvis was performed following the standard protocol during bolus administration of intravenous contrast. RADIATION DOSE REDUCTION: This exam was performed according to the  departmental dose-optimization program which includes automated exposure control, adjustment of the mA and/or kV according to patient size and/or use of iterative reconstruction technique. CONTRAST:  OMNIPAQUE  IOHEXOL  300 MG/ML  SOLN COMPARISON:  None Available. FINDINGS: CT CHEST FINDINGS Cardiovascular: Aortic atherosclerosis. Normal heart size. Left coronary artery calcifications. No pericardial effusion. Mediastinum/Nodes: Enlarged pretracheal and right hilar lymph nodes, right hilar nodes measuring up to 2.7 x 2.0 cm (series 2, image 27). The esophagus is filled with fluid and radiopaque ingested material (series 2, image 34). Thyroid and trachea without significant findings. Lungs/Pleura: Mild, predominantly paraseptal emphysema. Spiculated mass of the medial anterior right apex with a large interface to the adjacent pleura measuring 4.1 x 3.0 cm (series 6, image 32). No pleural effusion or pneumothorax. Musculoskeletal: No chest wall abnormality. Lytic lesion of the proximal left humerus (series 2, image 70). CT ABDOMEN PELVIS FINDINGS Hepatobiliary: No solid liver abnormality is seen. Small gallstone. No gallbladder wall thickening, or biliary  dilatation. Pancreas: Unremarkable. No pancreatic ductal dilatation or surrounding inflammatory changes. Spleen: Normal in size without significant abnormality. Adrenals/Urinary Tract: Adrenal glands are unremarkable. Kidneys are normal, without renal calculi, solid lesion, or hydronephrosis. Small diverticulum of the posterior right aspect of the urinary bladder. Stomach/Bowel: Stomach is within normal limits. Appendix appears normal. No evidence of bowel wall thickening, distention, or inflammatory changes. Vascular/Lymphatic: Severe aortic atherosclerosis. No enlarged abdominal or pelvic lymph nodes. Reproductive: No mass or other abnormality. Other: No abdominal wall hernia or abnormality. No ascites. Musculoskeletal: No acute osseous findings. IMPRESSION: 1. Spiculated mass of the medial anterior right pulmonary apex with a large interface to the adjacent pleura measuring 4.1 x 3.0 cm, consistent with primary lung malignancy. 2. Enlarged pretracheal and right hilar lymph nodes, consistent with nodal metastatic disease. 3. Lytic lesion of the proximal left humerus, consistent with osseous metastatic disease. 4. No evidence of lymphadenopathy or metastatic disease in the abdomen or pelvis. 5. Emphysema. 6. Coronary artery disease. 7. Cholelithiasis. These results will be called to the ordering clinician or representative by the Radiologist Assistant, and communication documented in the PACS or [employer]. Aortic Atherosclerosis (ICD10-I70.0) and Emphysema (ICD10-J43.9). Electronically Signed   By: Fredricka Jenny M.D.   On: 03/26/2024 12:41   DG Shoulder Left Result Date: 03/19/2024 CLINICAL DATA:  Pain. EXAM: LEFT SHOULDER - 3 VIEW COMPARISON:  11/08/2023. FINDINGS: No acute fracture, dislocation or subluxation. There is an osteolytic lesion with ill-defined margins consistent with a neoplastic process involving the proximal humerus. 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 No acute  fracture. Electronically Signed   By: Sydell Eva M.D.   On: 03/19/2024 12:09     Past medical hx Past Medical History:  Diagnosis Date   Achalasia and cardiospasm    Allergy    Arthritis    back ,shoulders    Asthma    Boils    under arms hx of   CAD (coronary artery disease)    a. 09/2016: cath showing 100% RCA occlusion with collaterals and 90% OM1 (treated with DES)   Candidiasis of mouth    Candidiasis of unspecified site    Cholelithiasis    Cocaine abuse (HCC)    Quit 09/2016   Condyloma acuminatum in male of scrotum & anal canal s/p laser ablation 07/03/2012   Coronary artery disease    Dysphagia    Fracture, humerus    Frozen shoulder    left   GERD (gastroesophageal reflux disease)    Heart attack (HCC)  Hemorrhoids    Hepatitis 1993   A   Herpes labialis    HIV (human immunodeficiency virus infection) (HCC) DX 1993   Hypertension Dx 2014   off all bp meds for last 9 or 10 months   Pilonidal disease 01/10/2012   Polysubstance abuse (HCC)    Psoriasis    hx of   Sleep apnea    does  have CPAP   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/03/2012   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 His smoking use included cigarettes. He has never used smokeless tobacco. He reports current drug use. Frequency: 3.00 times per week. Drugs: Marijuana and Cocaine.  He reports that he does not drink alcohol.  Tobacco Cessation: Counseling given: Not Answered Tobacco comments: 4 or 5 cigarettes a day  States quit one week ago 04/02/2024 Recently quit smoker with a 42 pack year smoking history Quit 1 week ago on 03/26/2024  Past surgical hx, Family hx, Social hx all reviewed.  Current Outpatient Medications on File Prior to Visit  Medication Sig   albuterol  (VENTOLIN  HFA) 108 (90 Base) MCG/ACT inhaler Inhale 2 puffs into the lungs every 6 (six) hours as needed for wheezing or shortness of breath.   aspirin  81 MG chewable tablet Chew 1 tablet (81 mg total) by mouth daily.   BIKTARVY  30-120-15 MG TABS TAKE 1 TABLET BY MOUTH EVERY DAY   Blood Pressure KIT 1 kit by Does not apply route daily.   diazepam  (VALIUM ) 5 MG tablet Take 1 tablet (5 mg total) by mouth every 6 (six) hours as needed for anxiety.   diclofenac  Sodium (VOLTAREN  ARTHRITIS PAIN) 1 % GEL Apply 2 g topically 4 (four) times daily.   DULoxetine  (CYMBALTA ) 60 MG capsule Take 1 capsule (60 mg total) by mouth daily. For chronic pain   fluconazole  (DIFLUCAN ) 200 MG tablet Take 1 tablet (200 mg total) by mouth daily.   hydrocortisone  2.5 % ointment Apply topically 2 (two) times daily. Apply around rectal area.   meloxicam  (MOBIC ) 15 MG tablet Take 1 tablet (15 mg total) by mouth daily.   methocarbamol  (ROBAXIN ) 500 MG tablet Take 2 tablets (1,000 mg total) by mouth every 8 (eight) hours as needed.   metoprolol  succinate (TOPROL -XL) 25 MG 24 hr tablet TAKE 1/2 TABLET BY MOUTH DAILY   nitroGLYCERIN  (NITROSTAT ) 0.4 MG SL tablet Place 1 tablet (0.4 mg total) under the tongue every 5 (five) minutes as needed for chest pain (trouble swallowing).   oxyCODONE  10 MG TABS Take 1 tablet (10 mg total) by mouth every 4 (four) hours for 14 days.   rosuvastatin  (CRESTOR ) 40 MG tablet Take 1 tablet (40 mg total) by mouth daily.  amLODipine  (NORVASC ) 5 MG tablet Take 1 tablet (5 mg total) by mouth daily.    methylPREDNISolone  (MEDROL  DOSEPAK) 4 MG TBPK tablet Take per packet instructions   No current facility-administered medications on file prior to visit.     Allergies  Allergen Reactions   Citrus Swelling    Mouth itching and swelling     Review Of Systems:  Constitutional:   + weight loss, night sweats,  Fevers, chills, fatigue, or  lassitude.  HEENT:   No headaches,  Difficulty swallowing,  Tooth/dental problems, or  Sore throat,                No sneezing, itching, ear ache, nasal congestion, post nasal drip,   CV:  No chest pain,  Orthopnea, PND, swelling in lower extremities, anasarca, dizziness, palpitations, syncope.   GI  No heartburn, indigestion, abdominal pain, nausea, vomiting, diarrhea, change in bowel habits, loss of appetite, bloody stools.   Resp: No shortness of breath with exertion or at rest.  No excess mucus, no productive cough,  No non-productive cough,  No coughing up of blood.  No change in color of mucus.  No wheezing.  No chest wall deformity  Skin: no rash or lesions.  GU: no dysuria, change in color of urine, no urgency or frequency.  No flank pain, no hematuria   MS:  No joint pain or swelling.  No decreased range of motion.  No back pain., + Shoulder and arm pain  Psych:  No change in mood or affect. No depression or anxiety.  No memory loss, anxious   Vital Signs BP 130/87 (BP Location: Right Arm, Patient Position: Sitting, Cuff Size: Normal)   Pulse 100   Ht 6\' 1"  (1.854 m)   Wt 191 lb 3.2 oz (86.7 kg)   SpO2 96%   BMI 25.23 kg/m    Physical Exam:  General- No distress,  A&Ox3, appropriate ENT: No sinus tenderness, TM clear, pale nasal mucosa, no oral exudate,no post nasal drip, no LAN Cardiac: S1, S2, regular rate and rhythm, no murmur Chest: No wheeze/ rales/ dullness; no accessory muscle use, no nasal flaring, no sternal retractions Abd.: Soft Non-tender, ND, BS +, Body mass index is 25.23 kg/m.  Ext: No clubbing cyanosis, edema,  no obvious deformities Neuro:  normal strength, MAE x 4, A&O x 3, appropriate Skin: No rashes, warm and dry, no obvious skin lesions Psych: normal mood and behavior, anxious and appropriately concerned.    Assessment/Plan Spiculated mass of the medial anterior right pulmonary apex Enlarged pretracheal and right hilar lymph nodes Left Humerus oseous lesion concerning for metastatic lung cancer Recently quit smoker ( 1 week) with 42 pack year smoking history. Plan  I have placed an order for a bronchoscopy with biopsies.  We have discussed the procedure in detail.  We have reviewed the risks and benefits of the procedure. These include bleeding, infection, puncture of the lung, and adverse reaction to anesthesia. You have agreed to proceed with biopsy to evaluate the right lung mass and lymph nodes. Your procedure will be done by Dr. Racheal Buddle. You will receive a letter today with date time and information pertaining to the procedure. You will need someone to drive you to the procedure, stay with you during the procedure, and stay with you after the procedure. You will also need someone to stay with you for 24 hours after anesthesia to ensure you have cleared and are doing well. You will follow-up with me 1 week  after the procedure to review the results and to ensure you are doing well. We will order a PET scan and MR brain at that time if your biopsies are positive for cancer. This will help to complete staging . Call if you need us  prior to the procedure or if you have any questions at all. Please contact office for sooner follow up if symptoms do not improve or worsen or seek emergency care      I spent 35 minutes dedicated to the care of this patient on the date of this encounter to include pre-visit review of records, face-to-face time with the patient discussing conditions above, post visit ordering of testing, clinical documentation with the electronic health record, making  appropriate referrals as documented, and communicating necessary information to the patient's healthcare team.    Raejean Bullock, NP 04/02/2024  9:47 AM

## 2024-04-02 NOTE — Progress Notes (Signed)
 Anesthesia Chart Review: Same day workup  58 y.o. 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   Patient has undergone recent evaluation for acute onset pain and limited range of motion of the left shoulder.  Recent x-ray showing lytic lesions of the left proximal humerus.  CT of the chest abdomen and pelvis 03/26/24 showed a spiculated mass of the medial anterior right pulmonary apex with a large interface to the adjacent pleura as well as enlarged pretracheal and right hilar lymph nodes in addition to the lytic lesion of his left proximal humerus concerning for metastatic disease.  Patient was urgently referred to pulmonology for bronchoscopy and biopsy.  CMP and CBC 03/21/2024 reviewed, unremarkable.  Patient will need to have surgery evaluation.  EKG 03/23/2023: Sinus bradycardia.  Rate 57.  CT chest abdomen pelvis 03/26/2024: IMPRESSION: 1. Spiculated mass of the medial anterior right pulmonary apex with a large interface to the adjacent pleura measuring 4.1 x 3.0 cm, consistent with primary lung malignancy. 2. Enlarged pretracheal and right hilar lymph nodes, consistent with nodal metastatic disease. 3. Lytic lesion of the proximal left humerus, consistent with osseous metastatic disease. 4. No evidence of lymphadenopathy or metastatic disease in the abdomen or pelvis. 5. Emphysema. 6. Coronary artery disease. 7. Cholelithiasis.  Nuclear stress 12/07/2021:   The study is normal. The study is low risk.   No ST deviation was noted.   LV perfusion is normal. There is no evidence of ischemia. There is no evidence of infarction.   Left ventricular function is normal. Nuclear stress EF: 56 %.  The left ventricular ejection fraction is normal (55-65%). End diastolic cavity size is normal. End systolic cavity size is normal.   Prior study available for comparison from 10/09/2016.    Edilia Gordon [facility]/Anesthesiology Phone [phone] 04/02/2024 3:23 PM

## 2024-04-02 NOTE — Patient Instructions (Addendum)
 It is good to see you today I have placed an order for a bronchoscopy with biopsies.  We have discussed the procedure in detail.  We have reviewed the risks and benefits of the procedure. These include bleeding, infection, puncture of the lung, and adverse reaction to anesthesia. You have agreed to proceed with biopsy to evaluate the right lung mass and lymph nodes. Your procedure will be done by Dr. Racheal Buddle. You will receive a letter today with date time and information pertaining to the procedure. You will need someone to drive you to the procedure, stay with you during the procedure, and stay with you after the procedure. You will also need someone to stay with you for 24 hours after anesthesia to ensure you have cleared and are doing well. You will follow-up with me 1 week after the procedure to review the results and to ensure you are doing well. Call if you need us  prior to the procedure or if you have any questions at all. Please contact office for sooner follow up if symptoms do not improve or worsen or seek emergency care

## 2024-04-02 NOTE — Progress Notes (Signed)
 SDW call  Patient was given pre-op instructions over the phone. Patient verbalized understanding of instructions provided.     PCP - Dr. Joaquin Mulberry Cardiologist - Dr. Dinah Franco Pulmonary:    PPM/ICD - denies Device Orders - na Rep Notified - na   Chest x-ray -  EKG -  DOS, 04/03/2024 Stress Test - 12/07/2021 ECHO - 10/09/2016 Cardiac Cath - 10/11/2016  Sleep Study/sleep apnea/CPAP: OSA, wears CPAP sometimes  Non-diabetic  Blood Thinner Instructions: denies Aspirin  Instructions:Hold 3 days, states last dose 03/30/2024   ERAS Protcol - NPO   Anesthesia review: Yes.  CAD, HTN, MI, OSA with CPAP, HIV +   Patient denies shortness of breath, fever, cough and chest pain over the phone call  Your procedure is scheduled on Tuesday April 03, 2024  Report to [facility] Main Entrance "A" at  1200 Noon , then check in with the Admitting office.  Call this number if you have problems the morning of surgery:  [phone]   If you have any questions prior to your surgery date call [phone]: Open Monday-Friday 8am-4pm If you experience any cold or flu symptoms such as cough, fever, chills, shortness of breath, etc. between now and your scheduled surgery, please notify us  at the above number    Remember:  Do not eat or drink after midnight the night before your surgery  Take these medicines the morning of surgery with A SIP OF WATER:  Amlodipine , biktarvy , metoprolol   As needed: Albuterol , robaxin , oxycodone   As of today, STOP taking any  Aleve, Naproxen, Ibuprofen , Motrin , Advil , Goody's, BC's, all herbal medications, fish oil, and all vitamins. This includes your voltaren  gel

## 2024-04-03 ENCOUNTER — Encounter (HOSPITAL_COMMUNITY): Payer: Self-pay | Admitting: Emergency Medicine

## 2024-04-03 ENCOUNTER — Ambulatory Visit (HOSPITAL_BASED_OUTPATIENT_CLINIC_OR_DEPARTMENT_OTHER): Payer: Self-pay | Admitting: Physician Assistant

## 2024-04-03 ENCOUNTER — Ambulatory Visit (HOSPITAL_COMMUNITY)
Admission: RE | Admit: 2024-04-03 | Discharge: 2024-04-03 | Disposition: A | Discharge status: Home / Self Care | Attending: Emergency Medicine | Admitting: Emergency Medicine

## 2024-04-03 ENCOUNTER — Other Ambulatory Visit: Payer: Self-pay

## 2024-04-03 ENCOUNTER — Ambulatory Visit (HOSPITAL_COMMUNITY): Payer: Self-pay | Admitting: Physician Assistant

## 2024-04-03 ENCOUNTER — Encounter (HOSPITAL_COMMUNITY)
Admission: RE | Disposition: A | Discharge status: Home / Self Care | Payer: Self-pay | Source: Home / Self Care | Attending: Emergency Medicine

## 2024-04-03 ENCOUNTER — Ambulatory Visit (HOSPITAL_COMMUNITY)

## 2024-04-03 DIAGNOSIS — R590 Localized enlarged lymph nodes: Secondary | ICD-10-CM

## 2024-04-03 DIAGNOSIS — G4730 Sleep apnea, unspecified: Secondary | ICD-10-CM | POA: Insufficient documentation

## 2024-04-03 DIAGNOSIS — F418 Other specified anxiety disorders: Secondary | ICD-10-CM | POA: Diagnosis not present

## 2024-04-03 DIAGNOSIS — R918 Other nonspecific abnormal finding of lung field: Secondary | ICD-10-CM

## 2024-04-03 DIAGNOSIS — Z87891 Personal history of nicotine dependence: Secondary | ICD-10-CM | POA: Diagnosis not present

## 2024-04-03 DIAGNOSIS — Z8042 Family history of malignant neoplasm of prostate: Secondary | ICD-10-CM | POA: Diagnosis not present

## 2024-04-03 DIAGNOSIS — C3411 Malignant neoplasm of upper lobe, right bronchus or lung: Secondary | ICD-10-CM | POA: Insufficient documentation

## 2024-04-03 DIAGNOSIS — Z800 Family history of malignant neoplasm of digestive organs: Secondary | ICD-10-CM | POA: Diagnosis not present

## 2024-04-03 DIAGNOSIS — I10 Essential (primary) hypertension: Secondary | ICD-10-CM | POA: Diagnosis not present

## 2024-04-03 DIAGNOSIS — I25119 Atherosclerotic heart disease of native coronary artery with unspecified angina pectoris: Secondary | ICD-10-CM

## 2024-04-03 DIAGNOSIS — Z955 Presence of coronary angioplasty implant and graft: Secondary | ICD-10-CM | POA: Insufficient documentation

## 2024-04-03 DIAGNOSIS — I2510 Atherosclerotic heart disease of native coronary artery without angina pectoris: Secondary | ICD-10-CM | POA: Diagnosis not present

## 2024-04-03 DIAGNOSIS — Z85828 Personal history of other malignant neoplasm of skin: Secondary | ICD-10-CM | POA: Diagnosis not present

## 2024-04-03 DIAGNOSIS — Z48813 Encounter for surgical aftercare following surgery on the respiratory system: Secondary | ICD-10-CM | POA: Diagnosis not present

## 2024-04-03 DIAGNOSIS — Z21 Asymptomatic human immunodeficiency virus [HIV] infection status: Secondary | ICD-10-CM | POA: Insufficient documentation

## 2024-04-03 DIAGNOSIS — I252 Old myocardial infarction: Secondary | ICD-10-CM | POA: Diagnosis not present

## 2024-04-03 HISTORY — PX: BRONCHOSCOPY, WITH BIOPSY USING ELECTROMAGNETIC NAVIGATION: SHX7536

## 2024-04-03 HISTORY — PX: BRONCHIAL NEEDLE ASPIRATION BIOPSY: SHX5106

## 2024-04-03 HISTORY — PX: BRONCHIAL BRUSHINGS: SHX5108

## 2024-04-03 HISTORY — PX: BRONCHIAL BIOPSY: SHX5109

## 2024-04-03 HISTORY — DX: Anxiety disorder, unspecified: F41.9

## 2024-04-03 SURGERY — BRONCHOSCOPY, WITH BIOPSY USING ELECTROMAGNETIC NAVIGATION
Anesthesia: General | Laterality: Right

## 2024-04-03 MED ORDER — PHENYLEPHRINE 80 MCG/ML (10ML) SYRINGE FOR IV PUSH (FOR BLOOD PRESSURE SUPPORT)
PREFILLED_SYRINGE | INTRAVENOUS | Status: DC | PRN
  Administered 2024-04-03 (×2): 80 ug via INTRAVENOUS
  Administered 2024-04-03: 160 ug via INTRAVENOUS

## 2024-04-03 MED ORDER — SODIUM CHLORIDE (PF) 0.9 % IJ SOLN
PREFILLED_SYRINGE | INTRAMUSCULAR | Status: DC | PRN
  Administered 2024-04-03: 6 mL

## 2024-04-03 MED ORDER — CHLORHEXIDINE GLUCONATE 0.12 % MT SOLN: 15.0000 mL | Freq: Once | OROMUCOSAL | Status: AC

## 2024-04-03 MED ORDER — SODIUM CHLORIDE (PF) 0.9 % IJ SOLN
PREFILLED_SYRINGE | INTRAMUSCULAR | Status: DC | PRN
  Administered 2024-04-03: 2 mL

## 2024-04-03 MED ORDER — PHENYLEPHRINE HCL-NACL 20-0.9 MG/250ML-% IV SOLN
INTRAVENOUS | Status: DC | PRN
  Administered 2024-04-03: 50 ug/min via INTRAVENOUS

## 2024-04-03 MED ORDER — PROPOFOL 10 MG/ML IV BOLUS
INTRAVENOUS | Status: DC | PRN
  Administered 2024-04-03: 140 mg via INTRAVENOUS

## 2024-04-03 MED ORDER — ONDANSETRON HCL 4 MG/2ML IJ SOLN
INTRAMUSCULAR | Status: DC | PRN
  Administered 2024-04-03: 4 mg via INTRAVENOUS

## 2024-04-03 MED ORDER — CHLORHEXIDINE GLUCONATE 0.12 % MT SOLN
OROMUCOSAL | Status: AC
  Administered 2024-04-03: 15 mL via OROMUCOSAL
  Filled 2024-04-03: qty 15

## 2024-04-03 MED ORDER — ACETAMINOPHEN 500 MG PO TABS
1000.0000 mg | ORAL_TABLET | Freq: Once | ORAL | Status: DC
  Filled 2024-04-03: qty 2

## 2024-04-03 MED ORDER — PROPOFOL 500 MG/50ML IV EMUL
INTRAVENOUS | Status: DC | PRN
Start: 2024-04-03 — End: 2024-04-03
  Administered 2024-04-03: 150 ug/kg/min via INTRAVENOUS

## 2024-04-03 MED ORDER — METOPROLOL SUCCINATE ER 25 MG PO TB24
25.0000 mg | ORAL_TABLET | Freq: Every day | ORAL | Status: DC
  Administered 2024-04-03: 25 mg via ORAL

## 2024-04-03 MED ORDER — DEXMEDETOMIDINE HCL IN NACL 80 MCG/20ML IV SOLN
INTRAVENOUS | Status: DC | PRN
  Administered 2024-04-03: 8 ug via INTRAVENOUS
  Administered 2024-04-03: 4 ug via INTRAVENOUS

## 2024-04-03 MED ORDER — OXYCODONE HCL 5 MG PO TABS
5.0000 mg | ORAL_TABLET | Freq: Once | ORAL | Status: AC
  Administered 2024-04-03: 5 mg via ORAL

## 2024-04-03 MED ORDER — DEXAMETHASONE SODIUM PHOSPHATE 10 MG/ML IJ SOLN
INTRAMUSCULAR | Status: DC | PRN
  Administered 2024-04-03: 10 mg via INTRAVENOUS

## 2024-04-03 MED ORDER — ROCURONIUM BROMIDE 10 MG/ML (PF) SYRINGE
PREFILLED_SYRINGE | INTRAVENOUS | Status: DC | PRN
  Administered 2024-04-03 (×2): 20 mg via INTRAVENOUS
  Administered 2024-04-03: 50 mg via INTRAVENOUS

## 2024-04-03 MED ORDER — FENTANYL CITRATE (PF) 100 MCG/2ML IJ SOLN: 25.0000 ug | INTRAMUSCULAR | Status: DC | PRN

## 2024-04-03 MED ORDER — EPINEPHRINE 1 MG/10ML IJ SOSY
PREFILLED_SYRINGE | INTRAMUSCULAR | Status: AC
  Filled 2024-04-03: qty 10

## 2024-04-03 MED ORDER — ACETAMINOPHEN 10 MG/ML IV SOLN
INTRAVENOUS | Status: DC | PRN
  Administered 2024-04-03: 1000 mg via INTRAVENOUS

## 2024-04-03 MED ORDER — FENTANYL CITRATE (PF) 250 MCG/5ML IJ SOLN
INTRAMUSCULAR | Status: DC | PRN
  Administered 2024-04-03 (×2): 50 ug via INTRAVENOUS

## 2024-04-03 MED ORDER — METOPROLOL SUCCINATE ER 25 MG PO TB24
ORAL_TABLET | ORAL | Status: AC
  Filled 2024-04-03: qty 1

## 2024-04-03 MED ORDER — LACTATED RINGERS IV SOLN: INTRAVENOUS | Status: DC

## 2024-04-03 MED ORDER — FENTANYL CITRATE (PF) 100 MCG/2ML IJ SOLN
INTRAMUSCULAR | Status: AC
Start: 2024-04-03 — End: ?
  Filled 2024-04-03: qty 2

## 2024-04-03 MED ORDER — OXYCODONE HCL 5 MG PO TABS
ORAL_TABLET | ORAL | Status: AC
  Filled 2024-04-03: qty 1

## 2024-04-03 MED ORDER — LIDOCAINE 2% (20 MG/ML) 5 ML SYRINGE
INTRAMUSCULAR | Status: DC | PRN
  Administered 2024-04-03: 80 mg via INTRAVENOUS

## 2024-04-03 MED ORDER — SUGAMMADEX SODIUM 200 MG/2ML IV SOLN
INTRAVENOUS | Status: DC | PRN
  Administered 2024-04-03: 200 mg via INTRAVENOUS

## 2024-04-03 NOTE — Discharge Instructions (Addendum)
 Flexible Bronchoscopy, Care After This sheet gives you information about how to care for yourself after your test. Your doctor may also give you more specific instructions. If you have problems or questions, contact your doctor. Follow these instructions at home: Eating and drinking When you are wide awake, your numbness is gone and your cough and gag reflexes have come back, you may: Start eating only soft foods. Slowly drink liquids. Six hours after the test, go back to your normal diet. Driving Do not drive for 24 hours if you were given a medicine to help you relax (sedative). Do not drive or use heavy machinery while taking prescription pain medicine. General instructions Take over-the-counter and prescription medicines only as told by your doctor. Return to your normal activities as told. Ask what activities are safe for you. Do not use any products that have nicotine or tobacco in them. This includes cigarettes and e-cigarettes. If you need help quitting, ask your doctor. Keep all follow-up visits as told by your doctor. This is important. It is very important if you had a tissue sample (biopsy) taken. Get help right away if: You have shortness of breath that gets worse. You get light-headed. You feel like you are going to pass out (faint). You have chest pain. You cough up: More than a little blood. More blood than before. Summary Do not use cigarettes. Do not use e-cigarettes. Seek care in the Emergency Department right away if you have chest pain or shortness of breath. Call or MyChart Message our office for any questions or problems at [phone].  Okay to restart your aspirin  on 04/04/2024.   This information is not intended to replace advice given to you by your health care provider. Make sure you discuss any questions you have with your health care provider.

## 2024-04-03 NOTE — Anesthesia Procedure Notes (Signed)
 Procedure Name: Intubation Date/Time: 04/03/2024 1:44 PM  Performed by: Hershall Lory, CRNAPre-anesthesia Checklist: Patient identified, Emergency Drugs available, Suction available and Patient being monitored Patient Re-evaluated:Patient Re-evaluated prior to induction Oxygen Delivery Method: Circle System Utilized Preoxygenation: Pre-oxygenation with 100% oxygen Induction Type: IV induction Ventilation: Mask ventilation without difficulty Laryngoscope Size: McGrath Grade View: Grade II Tube type: Oral Tube size: 8.5 mm Number of attempts: 1 Airway Equipment and Method: Stylet and Oral airway Placement Confirmation: ETT inserted through vocal cords under direct vision, positive ETCO2 and breath sounds checked- equal and bilateral Tube secured with: Tape Dental Injury: Teeth and Oropharynx as per pre-operative assessment

## 2024-04-03 NOTE — Op Note (Signed)
 Video Bronchoscopy with Robotic Assisted Bronchoscopic Navigation   Date of Operation: 04/03/2024   Pre-op Diagnosis: Right upper lobe mass, right hilar mass with mediastinal adenopathy  Post-op Diagnosis: Same  Surgeon: Racheal Buddle  Assistants: None  Anesthesia: General endotracheal anesthesia  Operation: Flexible video fiberoptic bronchoscopy with robotic assistance and biopsies.  Estimated Blood Loss: Minimal  Complications: None  Indications and History: Benjamin Herrera is a 58 y.o. male with history of tobacco use, HIV, polysubstance use, asthma, CAD.  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  Further imaging revealed an apical right upper lobe mass, right hilar mass with mediastinal adenopathy.  Recommendation made to achieve a tissue diagnosis via robotic assisted navigational bronchoscopy.  The risks, benefits, complications, treatment options and expected outcomes were discussed with the patient.  The possibilities of pneumothorax, pneumonia, reaction to medication, pulmonary aspiration, perforation of a viscus, bleeding, failure to diagnose a condition and creating a complication requiring transfusion or operation were discussed with the patient who freely signed the consent.    Description of Procedure: The patient was seen in the Preoperative Area, was examined and was deemed appropriate to proceed.  The patient was taken to [facility] Endoscopy room 3, identified as Benjamin Herrera and the procedure verified as Flexible Video Fiberoptic Bronchoscopy.  A Time Out was held and the above information confirmed.   Prior to the date of the procedure a high-resolution CT scan of the chest was performed. Utilizing ION software program a virtual tracheobronchial tree was generated to allow the creation of distinct navigation pathways to the patient's parenchymal abnormalities.  After being taken to the operating room general anesthesia was initiated and the patient  was orally intubated. The video fiberoptic bronchoscope was introduced via the endotracheal tube and a general inspection was performed which showed normal left lung anatomy.  There was an exophytic endobronchial lesion along the orifice of the right upper lobe airway and extending into the right upper lobe airway along the medial surface and impacting the medial subsegment.  Endobronchial brushings and endobronchial forceps biopsies were performed on this lesion to go for cytology.  Aspiration of the bilateral mainstems was completed to remove any remaining secretions. Robotic catheter inserted into patient's endotracheal tube.   Target #1 right upper lobe mass: The distinct navigation pathways prepared prior to this procedure were then utilized to navigate to patient's lesion identified on CT scan. The robotic catheter was secured into place and the vision probe was withdrawn.  Lesion location was approximated using fluoroscopy.  Local registration and targeting was performed using [employer] Cios mobile C-arm three-dimensional imaging. Under fluoroscopic guidance transbronchial needle brushings, transbronchial needle biopsies, and transbronchial forceps biopsies were performed to be sent for cytology and pathology.  Needle-in-lesion was confirmed using Cios mobile C-arm.     At the end of the procedure a general airway inspection was performed and there was no evidence of active bleeding. The bronchoscope was removed.  The patient tolerated the procedure well. There was no significant blood loss and there were no obvious complications. A post-procedural chest x-ray is pending.  Samples Target #1: 1. Transbronchial needle brushings from right upper lobe mass 2. Transbronchial Wang needle biopsies from right upper lobe mass 3. Transbronchial forceps biopsies from right upper lobe mass  Other samples: 1.  Endobronchial biopsies from proximal right upper lobe mass 2.  Endobronchial brushings from proximal  right upper lobe mass   Plans:  The patient will be discharged from the PACU to home when recovered from anesthesia and after chest x-ray is reviewed. We will review the cytology, pathology and microbiology results with the patient when they become available. Outpatient followup will be with Braden Caddy, NP.   Racheal Buddle, MD, PhD 04/03/2024, 2:43 PM [facility] [phone] or if no answer before 7:00PM call [phone] For any issues after 7:00PM please call eLink [phone]

## 2024-04-03 NOTE — Interval H&P Note (Signed)
 History and Physical Interval Note:  04/03/2024 12:32 PM  Benjamin Herrera  has presented today for surgery, with the diagnosis of Lung mass with lymphadenopathy.  The various methods of treatment have been discussed with the patient and family. After consideration of risks, benefits and other options for treatment, the patient has consented to   ROBOTIC ASSISTED NAVIGATIONAL BRONCHOSCOPY ENDOBRONCHIAL ULTRASOUND as a surgical intervention.  The patient's history has been reviewed, patient examined, no change in status, stable for surgery.  I have reviewed the patient's chart and labs.  Questions were answered to the patient's satisfaction.     Denson Flake

## 2024-04-03 NOTE — Anesthesia Postprocedure Evaluation (Signed)
 Anesthesia Post Note  Patient: Benjamin Herrera  Procedure(s) Performed: BRONCHOSCOPY, WITH BIOPSY USING ELECTROMAGNETIC NAVIGATION (Right) BRONCHOSCOPY, WITH BRUSH BIOPSY BRONCHOSCOPY, WITH NEEDLE ASPIRATION BIOPSY BRONCHOSCOPY, WITH BIOPSY     Patient location during evaluation: PACU Anesthesia Type: General Level of consciousness: awake and alert Pain management: pain level controlled Vital Signs Assessment: post-procedure vital signs reviewed and stable Respiratory status: spontaneous breathing, nonlabored ventilation and respiratory function stable Cardiovascular status: blood pressure returned to baseline and stable Postop Assessment: no apparent nausea or vomiting Anesthetic complications: no  No notable events documented.  Last Vitals:  Vitals:   04/03/24 1515 04/03/24 1530  BP: 128/76 130/75  Pulse: 79 77  Resp: 19 (!) 24  Temp:    SpO2: 97% 100%    Last Pain:  Vitals:   04/03/24 1451  TempSrc:   PainSc: 0-No pain                 Autumnrose Yore,W. EDMOND

## 2024-04-03 NOTE — Transfer of Care (Signed)
 Immediate Anesthesia Transfer of Care Note  Patient: Benjamin Herrera  Procedure(s) Performed: BRONCHOSCOPY, WITH BIOPSY USING ELECTROMAGNETIC NAVIGATION (Right) BRONCHOSCOPY, WITH BRUSH BIOPSY BRONCHOSCOPY, WITH NEEDLE ASPIRATION BIOPSY BRONCHOSCOPY, WITH BIOPSY  Patient Location: PACU  Anesthesia Type:General  Level of Consciousness: awake and alert   Airway & Oxygen Therapy: Patient Spontanous Breathing  Post-op Assessment: Report given to RN  Post vital signs: Reviewed and stable  Last Vitals:  Vitals Value Taken Time  BP 133/66 04/03/24 1451  Temp    Pulse 87 04/03/24 1452  Resp 24 04/03/24 1452  SpO2 99 % 04/03/24 1452  Vitals shown include unfiled device data.  Last Pain:  Vitals:   04/03/24 1304  TempSrc:   PainSc: 0-No pain      Patients Stated Pain Goal: 3 (04/03/24 1248)  Complications: No notable events documented.

## 2024-04-05 ENCOUNTER — Ambulatory Visit: Admitting: Dermatology

## 2024-04-05 LAB — CYTOLOGY - NON PAP

## 2024-04-06 ENCOUNTER — Telehealth: Payer: Self-pay | Admitting: Physician Assistant

## 2024-04-06 ENCOUNTER — Ambulatory Visit (HOSPITAL_COMMUNITY)
Admission: RE | Admit: 2024-04-06 | Discharge: 2024-04-06 | Disposition: A | Discharge status: Home / Self Care | Source: Ambulatory Visit | Attending: Physician Assistant

## 2024-04-06 ENCOUNTER — Encounter (HOSPITAL_COMMUNITY): Payer: Self-pay | Admitting: Emergency Medicine

## 2024-04-06 ENCOUNTER — Other Ambulatory Visit: Payer: Self-pay | Admitting: Physician Assistant

## 2024-04-06 DIAGNOSIS — K118 Other diseases of salivary glands: Secondary | ICD-10-CM | POA: Diagnosis not present

## 2024-04-06 DIAGNOSIS — G939 Disorder of brain, unspecified: Secondary | ICD-10-CM | POA: Diagnosis not present

## 2024-04-06 DIAGNOSIS — R918 Other nonspecific abnormal finding of lung field: Secondary | ICD-10-CM | POA: Insufficient documentation

## 2024-04-06 DIAGNOSIS — C3490 Malignant neoplasm of unspecified part of unspecified bronchus or lung: Secondary | ICD-10-CM | POA: Diagnosis not present

## 2024-04-06 DIAGNOSIS — I6782 Cerebral ischemia: Secondary | ICD-10-CM | POA: Diagnosis not present

## 2024-04-06 MED ORDER — GADOBUTROL 1 MMOL/ML IV SOLN
9.0000 mL | Freq: Once | INTRAVENOUS | Status: AC | PRN
Start: 2024-04-06 — End: 2024-04-06
  Administered 2024-04-06: 9 mL via INTRAVENOUS

## 2024-04-06 NOTE — Telephone Encounter (Addendum)
 I notified Avel Bock by phone regarding lung mass biopsy results.  Results were discussed with oncologist Dr. Rosaline Coma and Dr. Marguerita Shih. 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 Patient will need PET scan and MRI brain to complete staging work up. Patient is scheduled to follow up with Dr. Marguerita Shih to discuss scan results and finalize treatment recommendations on 04/16/24. All of patient's questions were answered and he expressed understanding of the plan provided.

## 2024-04-09 ENCOUNTER — Encounter: Payer: Self-pay | Admitting: Family Medicine

## 2024-04-09 ENCOUNTER — Telehealth: Payer: Self-pay | Admitting: Family Medicine

## 2024-04-09 ENCOUNTER — Ambulatory Visit (HOSPITAL_COMMUNITY)
Admission: RE | Admit: 2024-04-09 | Discharge: 2024-04-09 | Disposition: A | Discharge status: Home / Self Care | Source: Ambulatory Visit | Attending: Physician Assistant | Admitting: Physician Assistant

## 2024-04-09 ENCOUNTER — Ambulatory Visit: Attending: Family Medicine | Admitting: Family Medicine

## 2024-04-09 VITALS — BP 143/75 | HR 74 | Ht 73.0 in | Wt 195.8 lb

## 2024-04-09 DIAGNOSIS — I10 Essential (primary) hypertension: Secondary | ICD-10-CM

## 2024-04-09 DIAGNOSIS — B20 Human immunodeficiency virus [HIV] disease: Secondary | ICD-10-CM | POA: Diagnosis not present

## 2024-04-09 DIAGNOSIS — R918 Other nonspecific abnormal finding of lung field: Secondary | ICD-10-CM | POA: Diagnosis not present

## 2024-04-09 DIAGNOSIS — C7951 Secondary malignant neoplasm of bone: Secondary | ICD-10-CM | POA: Diagnosis not present

## 2024-04-09 DIAGNOSIS — M25512 Pain in left shoulder: Secondary | ICD-10-CM

## 2024-04-09 DIAGNOSIS — C3490 Malignant neoplasm of unspecified part of unspecified bronchus or lung: Secondary | ICD-10-CM | POA: Diagnosis not present

## 2024-04-09 DIAGNOSIS — G8929 Other chronic pain: Secondary | ICD-10-CM

## 2024-04-09 DIAGNOSIS — I25119 Atherosclerotic heart disease of native coronary artery with unspecified angina pectoris: Secondary | ICD-10-CM

## 2024-04-09 LAB — GLUCOSE, CAPILLARY: Glucose-Capillary: 90 mg/dL (ref 70–99)

## 2024-04-09 MED ORDER — FLUDEOXYGLUCOSE F - 18 (FDG) INJECTION
9.5000 | Freq: Once | INTRAVENOUS | Status: AC
  Administered 2024-04-09: 9.5 via INTRAVENOUS

## 2024-04-09 NOTE — Progress Notes (Signed)
 Subjective:  Patient ID: Benjamin Herrera, male    DOB: Aug 16, 1966  Age: 58 y.o. MRN: [id number]  CC: Medical Management of Chronic Issues (Chronic pain/Discuss home health)     Discussed the use of AI scribe software for clinical note transcription with the patient, who gave verbal consent to proceed.  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  He initially experienced shoulder pain in January, with an x-ray showing no acute fracture or dislocation.  He was seen by orthopedic and received a cortisone injection in his left shoulder with no relief and he sought a second opinion with another orthopedist.  A subsequent x-ray revealed a proximal humeral lytic lesion, likely neoplastic.  The pain is now debilitating, requiring assistance for mobility.  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  He is undergoing diagnostic workups, including MRIs, CT scans, and a PET scan. He is under the care of a pulmonologist and oncologist. Oxycodone  10 mg is used for pain management and he denies planes of constipation.  He quit smoking after his lung cancer diagnosis. He is disabled and relies on his sister for care, seeking home care services through the CAP program. He takes blood pressure medication and has not experienced recent constipation despite oxycodone  use. His appetite is adequate, and he resides at his mother's house due to his inability to perform daily activities independently. No abdominal pain is present.    Past Medical History:  Diagnosis Date   Achalasia and cardiospasm    Allergy    Anxiety    Arthritis    back ,shoulders    Asthma    Boils    under arms hx of   CAD  (coronary artery disease)    a. 09/2016: cath showing 100% RCA occlusion with collaterals and 90% OM1 (treated with DES)   Candidiasis of mouth    Candidiasis of unspecified site    Cholelithiasis    Cocaine abuse (HCC)    Quit 09/2016   Condyloma acuminatum in male of scrotum & anal canal s/p laser ablation 07/03/2012   Coronary artery disease    Dysphagia    Fracture, humerus    Frozen shoulder    left   GERD (gastroesophageal reflux disease)    Heart attack (HCC)    Hemorrhoids    Hepatitis 1993   A   Herpes labialis    HIV (human immunodeficiency virus infection) (HCC) DX 1993   Hypertension Dx 2014   off all bp meds for last 9 or 10 months   Pilonidal disease 01/10/2012   Polysubstance abuse (HCC)    Psoriasis    hx of   Sleep apnea    does  have CPAP   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/03/2012   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 08/03/2012    Past Surgical History:  Procedure Laterality Date   BALLOON DILATION N/A 11/11/2015   Procedure: BALLOON DILATION;  Surgeon: Sergio Dandy, MD;  Location: [facility];  Service: Endoscopy;  Laterality: N/A;   BOTOX  INJECTION  10/11/2012   Procedure: BOTOX  INJECTION;  Surgeon: Claudette Cue, MD;  Location:  [facility];  Service: Endoscopy;  Laterality: N/A;   BOTOX  INJECTION N/A 01/25/2013   Procedure: BOTOX  INJECTION;  Surgeon: Claudette Cue, MD;  [facility]: [facility];  Service: Endoscopy;  Laterality: N/A;   BOTOX  INJECTION N/A 06/21/2013   Procedure: BOTOX  INJECTION;  Surgeon: Claudette Cue, MD;  [facility]: [facility];  Service: Endoscopy;  Laterality: N/A;   BOTOX  INJECTION N/A 10/08/2013   Procedure: BOTOX  INJECTION;  Surgeon: Claudette Cue, MD;  [facility]: [facility];  Service: Endoscopy;  Laterality: N/A;   BOTOX  INJECTION N/A 06/16/2015   Procedure: BOTOX  INJECTION;  Surgeon: Claudette Cue, MD;  [facility];  Service:  Endoscopy;  Laterality: N/A;   BRONCHIAL BIOPSY  04/03/2024   Procedure: BRONCHOSCOPY, WITH BIOPSY;  Surgeon: Denson Flake, MD;  Location: [facility] END[facility]COPY;  Service: Pulmonary;;   BRONCHIAL BRUSHINGS  04/03/2024   Procedure: BRONCHOSCOPY, WITH BRUSH BIOPSY;  Surgeon: Denson Flake, MD;  Location: [facility];  Service: Pulmonary;;   BRONCHIAL NEEDLE ASPIRATION BIOPSY  04/03/2024   Procedure: BRONCHOSCOPY, WITH NEEDLE ASPIRATION BIOPSY;  Surgeon: Denson Flake, MD;  Location: [facility];  Service: Pulmonary;;   BRONCHOSCOPY, WITH BIOPSY USING ELECTROMAGNETIC NAVIGATION Right 04/03/2024   Procedure: BRONCHOSCOPY, WITH BIOPSY USING ELECTROMAGNETIC NAVIGATION;  Surgeon: Denson Flake, MD;  Location: [facility];  Service: Pulmonary;  Laterality: Right;   CARDIAC CATHETERIZATION N/A 10/11/2016   Procedure: Left Heart Cath and Coronary Angiography;  Surgeon: Odie Benne, MD;  mRCA 100% w/ L>R collaterals, OM1 90%   CARDIAC CATHETERIZATION N/A 10/11/2016   Procedure: Coronary Stent Intervention;  Surgeon: Odie Benne, MD;  RESOLUTE ONYX 3.5X15 DES OM1   COLONOSCOPY  05/19/2012   Procedure: COLONOSCOPY;  Surgeon: Claudette Cue, MD;  [facility]: [facility];  Service: Endoscopy;  Laterality: N/A;  jill trying to contact pt to come in 0830 for 930 case, phone not accepting messages   COLONOSCOPY     ESOPHAGEAL MANOMETRY  05/29/2012   Procedure: ESOPHAGEAL MANOMETRY (EM);  Surgeon: Claudette Cue, MD;  [facility]: [facility];  Service: Endoscopy;  Laterality: N/A;   ESOPHAGEAL MANOMETRY N/A 10/06/2015   Procedure: ESOPHAGEAL MANOMETRY (EM);  Surgeon: Sergio Dandy, MD;  [facility]: [facility];  Service: Endoscopy;  Laterality: N/A;   ESOPHAGOGASTRODUODENOSCOPY  11/11/2011   Procedure: ESOPHAGOGASTRODUODENOSCOPY (EGD);  Surgeon: Claudette Cue, MD;  Location: [facility];  Service: Endoscopy;  Laterality: N/A;  botox  injection  called Pt to change time of procedure per Dr  Arvie Latus   ESOPHAGOGASTRODUODENOSCOPY  03/10/2012   Procedure: ESOPHAGOGASTRODUODENOSCOPY (EGD);  Surgeon: Claudette Cue, MD;  Location: [facility];  Service: Endoscopy;  Laterality: N/A;   ESOPHAGOGASTRODUODENOSCOPY  10/11/2012   Procedure: ESOPHAGOGASTRODUODENOSCOPY (EGD);  Surgeon: Claudette Cue, MD;  Location: [facility];  Service: Endoscopy;  Laterality: N/A;   ESOPHAGOGASTRODUODENOSCOPY N/A 01/25/2013   Procedure: ESOPHAGOGASTRODUODENOSCOPY (EGD);  Surgeon: Claudette Cue, MD;  Location: [facility];  Service: Endoscopy;  Laterality: N/A;   ESOPHAGOGASTRODUODENOSCOPY N/A 06/21/2013   Procedure: ESOPHAGOGASTRODUODENOSCOPY (EGD);  Surgeon: Claudette Cue, MD;  Location: [facility];  Service: Endoscopy;  Laterality: N/A;   ESOPHAGOGASTRODUODENOSCOPY (EGD) WITH PROPOFOL  N/A 10/08/2013   Procedure: ESOPHAGOGASTRODUODENOSCOPY (EGD) WITH PROPOFOL ;  Surgeon: Claudette Cue, MD;  [facility];  Service: Endoscopy;  Laterality: N/A;   ESOPHAGOGASTRODUODENOSCOPY (EGD) WITH PROPOFOL  N/A 06/16/2015   Procedure: ESOPHAGOGASTRODUODENOSCOPY (EGD) WITH PROPOFOL ;  Surgeon: Claudette Cue, MD;  [facility]: [facility];  Service: Endoscopy;  Laterality: N/A;   ESOPHAGOGASTRODUODENOSCOPY (EGD) WITH  PROPOFOL  N/A 11/11/2015   Procedure: ESOPHAGOGASTRODUODENOSCOPY (EGD) WITH PROPOFOL ;  Surgeon: Sergio Dandy, MD;  [facility]: [facility];  Service: Endoscopy;  Laterality: N/A;   EXAMINATION UNDER ANESTHESIA  07/24/2012   Procedure: EXAM UNDER ANESTHESIA;  Surgeon: Eddye Goodie, MD;  Location: [facility];  Service: General;  Laterality: N/A;   humeral fracture surgery Right yrs ago   PILONIDAL CYST EXCISION  07/24/2012   Procedure: CYST EXCISION PILONIDAL SIMPLE;  Surgeon: Eddye Goodie, MD;  Location: [facility];  Service: General;  Laterality: N/A;  Exam Under Anesthesia,, Excision Pilonidal Disease,    right shoulder replacement  2008   x 2   UPPER GASTROINTESTINAL ENDOSCOPY     WART  FULGURATION  07/24/2012   Procedure: FULGURATION ANAL WART;  Surgeon: Eddye Goodie, MD;  Location: [facility];  Service: General;  Laterality: N/A;  excision of raphe mass    Family History  Problem Relation Age of Onset   Arthritis Mother    Hypertension Mother    Heart disease Mother        MI age 70   Heart attack Sister    Diabetes Maternal Grandmother    Stomach cancer Paternal Grandmother    Colon cancer Neg Hx    Colon polyps Neg Hx    Esophageal cancer Neg Hx    Rectal cancer Neg Hx     Social History   Socioeconomic History   Marital status: Single    Spouse name: Not on file   Number of children: 0   Years of education: Not on file   Highest education level: Not on file  Occupational History   Occupation: Disabled  Tobacco Use   Smoking status: Former    Current packs/day: 1.00    Average packs/day: 1 pack/day for 42.5 years (42.5 ttl pk-yrs)    Types: Cigarettes    Start date: 1983   Smokeless tobacco: Never   Tobacco comments:    4 or 5 cigarettes a day     States quit one week ago 04/02/2024  Vaping Use   Vaping status: Never Used  Substance and Sexual Activity   Alcohol use: No    Alcohol/week: 0.0 standard drinks of alcohol   Drug use: Yes    Frequency: 3.0 times per week    Types: Marijuana   Sexual activity: Not Currently    Partners: Male    Comment: pt. given condoms, used marijuana -3 days ago 05/14/16  Other Topics Concern   Not on file  Social History Narrative   Pt lives in a ground floor apartment in [place].   Social Drivers of Corporate investment banker Strain: Low Risk  (11/08/2023)   Overall Financial Resource Strain (CARDIA)    Difficulty of Paying Living Expenses: Not very hard  Food Insecurity: Food Insecurity Present (11/08/2023)   Hunger Vital Sign    Worried About Running Out of Food in the Last Year: Sometimes true    Ran Out of Food in the Last Year: Often true  Transportation Needs: No Transportation Needs (11/08/2023)    PRAPARE - Administrator, Civil Service (Medical): No    Lack of Transportation (Non-Medical): No  Physical Activity: Inactive (11/08/2023)   Exercise Vital Sign    Days of Exercise per Week: 0 days    Minutes of Exercise per Session: 0 min  Stress: No Stress Concern Present (11/08/2023)   [employer] of Occupational Health - Occupational Stress Questionnaire  Feeling of Stress : Not at all  Social Connections: Socially Isolated (11/08/2023)   Social Connection and Isolation Panel    Frequency of Communication with Friends and Family: Three times a week    Frequency of Social Gatherings with Friends and Family: Three times a week    Attends Religious Services: Never    Active Member of Clubs or Organizations: No    Attends Banker Meetings: Never    Marital Status: Never married    Allergies  Allergen Reactions   Citrus Swelling    Mouth itching and swelling     Outpatient Medications Prior to Visit  Medication Sig Dispense Refill   albuterol  (VENTOLIN  HFA) 108 (90 Base) MCG/ACT inhaler Inhale 2 puffs into the lungs every 6 (six) hours as needed for wheezing or shortness of breath. 1 each 2   amLODipine  (NORVASC ) 5 MG tablet Take 1 tablet (5 mg total) by mouth daily. 90 tablet 3   aspirin  81 MG chewable tablet Chew 1 tablet (81 mg total) by mouth daily. 90 tablet 3   BIKTARVY  30-120-15 MG TABS TAKE 1 TABLET BY MOUTH EVERY DAY 30 tablet 11   Blood Pressure KIT 1 kit by Does not apply route daily. 1 kit 0   diclofenac  Sodium (VOLTAREN  ARTHRITIS PAIN) 1 % GEL Apply 2 g topically 4 (four) times daily. 100 g 3   methocarbamol  (ROBAXIN ) 500 MG tablet Take 2 tablets (1,000 mg total) by mouth every 8 (eight) hours as needed. 90 tablet 1   metoprolol  succinate (TOPROL -XL) 25 MG 24 hr tablet TAKE 1/2 TABLET BY MOUTH DAILY 45 tablet 0   nitroGLYCERIN  (NITROSTAT ) 0.4 MG SL tablet Place 1 tablet (0.4 mg total) under the tongue every 5 (five) minutes as needed for  chest pain (trouble swallowing). 25 tablet 1   meloxicam  (MOBIC ) 15 MG tablet Take 1 tablet (15 mg total) by mouth daily. (Patient not taking: Reported on 04/09/2024) 30 tablet 1   No facility-administered medications prior to visit.     ROS Review of Systems  Constitutional:  Negative for activity change and appetite change.  HENT:  Negative for sinus pressure and sore throat.   Respiratory:  Negative for chest tightness, shortness of breath and wheezing.   Cardiovascular:  Negative for chest pain and palpitations.  Gastrointestinal:  Negative for abdominal distention, abdominal pain and constipation.  Genitourinary: Negative.   Musculoskeletal:        See HPI  Psychiatric/Behavioral:  Negative for behavioral problems and dysphoric mood.     Objective:  BP (!) 143/75   Pulse 74   Ht 6' 1 (1.854 m)   Wt 195 lb 12.8 oz (88.8 kg)   SpO2 99%   BMI 25.83 kg/m      04/09/2024    3:32 PM 04/09/2024    3:00 PM 04/03/2024    3:30 PM  BP/Weight  Systolic BP 143 142 130  Diastolic BP 75 77 75  Wt. (Lbs)  195.8   BMI  25.83 kg/m2       Physical Exam Constitutional:      Appearance: He is well-developed.   Cardiovascular:     Rate and Rhythm: Normal rate.     Heart sounds: Normal heart sounds. No murmur heard. Pulmonary:     Effort: Pulmonary effort is normal.     Breath sounds: Normal breath sounds. No wheezing or rales.  Chest:     Chest wall: No tenderness.  Abdominal:     General: Bowel  sounds are normal. There is no distension.     Palpations: Abdomen is soft. There is no mass.     Tenderness: There is no abdominal tenderness.   Musculoskeletal:        General: Normal range of motion.     Right lower leg: No edema.     Left lower leg: No edema.     Comments: Left arm in a sling   Neurological:     Mental Status: He is alert and oriented to person, place, and time.   Psychiatric:        Mood and Affect: Mood normal.        Latest Ref Rng & Units  03/21/2024    2:04 PM 01/05/2024    2:21 PM 03/23/2023    4:17 PM  CMP  Glucose 70 - 99 mg/dL 70  64  82   BUN 6 - 20 mg/dL 9  16  9    Creatinine 0.61 - 1.24 mg/dL 4.01  0.27  2.53   Sodium 135 - 145 mmol/L 139  139  143   Potassium 3.5 - 5.1 mmol/L 3.6  3.7  3.0   Chloride 98 - 111 mmol/L 102  103  101   CO2 22 - 32 mmol/L 31  23  30    Calcium  8.9 - 10.3 mg/dL 9.7  9.0  9.4   Total Protein 6.5 - 8.1 g/dL 7.8   7.1   Total Bilirubin 0.0 - 1.2 mg/dL 0.5   0.4   Alkaline Phos 38 - 126 U/L 83   84   AST 15 - 41 U/L 14   14   ALT 0 - 44 U/L 9   8     Lipid Panel     Component Value Date/Time   CHOL 138 03/23/2023 1617   TRIG 87 03/23/2023 1617   HDL 34 (L) 03/23/2023 1617   CHOLHDL 4.1 03/23/2023 1617   CHOLHDL 6.2 (H) 07/30/2020 1437   VLDL 21 01/26/2017 1526   LDLCALC 87 03/23/2023 1617   LDLCALC 124 (H) 07/30/2020 1437   LDLDIRECT 81 03/23/2023 1617    CBC    Component Value Date/Time   WBC 3.9 (L) 03/21/2024 1404   WBC 7.5 10/29/2021 0245   RBC 5.42 03/21/2024 1404   HGB 13.6 03/21/2024 1404   HGB 13.9 03/23/2023 1617   HCT 42.3 03/21/2024 1404   HCT 42.8 03/23/2023 1617   PLT 185 03/21/2024 1404   PLT 194 03/23/2023 1617   MCV 78.0 (L) 03/21/2024 1404   MCV 78 (L) 03/23/2023 1617   MCH 25.1 (L) 03/21/2024 1404   MCHC 32.2 03/21/2024 1404   RDW 15.8 (H) 03/21/2024 1404   RDW 14.5 03/23/2023 1617   LYMPHSABS 1.5 03/21/2024 1404   MONOABS 0.3 03/21/2024 1404   EOSABS 0.1 03/21/2024 1404   EOSABS 0.1 K/UL 08/22/2006 1054   BASOSABS 0.0 03/21/2024 1404    Lab Results  Component Value Date   HGBA1C 5.9 (H) 10/08/2016      1. Essential hypertension (Primary) Slightly above goal Will make no regimen changes as he is currently in pain Counseled on blood pressure goal of less than 130/80, low-sodium, DASH diet, medication compliance, 150 minutes of moderate intensity exercise per week. Discussed medication compliance, adverse effects.   2. Lung cancer  metastatic to bone [facility]) Currently under the care of pulmonary and oncology Scan completed today Management per oncology  3. HIV disease (HCC) Currently on antiretroviral therapy  4. Chronic left  shoulder pain Currently on oxycodone  for pain Continue follow-up with orthopedic  5.  CAD Status post DES Secondary risk factor modification Continue statin Continue to follow-up with cardiology  No orders of the defined types were placed in this encounter.   Follow-up: Return in about 6 months (around 10/09/2024) for Chronic medical conditions.       Joaquin Mulberry, MD, FAAFP. [facility] [place], [place] [phone]   04/09/2024, 5:25 PM

## 2024-04-09 NOTE — Telephone Encounter (Signed)
 Please reach out to him as he informed me he is interested in CAP Services.  Nursing notes initially said [facility] services but he informed me of CAP.  Thank you.

## 2024-04-09 NOTE — Patient Instructions (Signed)
 VISIT SUMMARY:  Today, we discussed your severe shoulder pain and functional impairment due to metastatic lung cancer. We reviewed your current treatments and medications, including pain management and blood pressure medication. We also talked about your need for home care services and your ongoing care with your oncologist, pulmonologist, and cardiology clinic.  YOUR PLAN:  -LUNG CANCER: Your lung cancer has spread to your shoulder, causing significant pain and difficulty with movement. You are currently under the care of an oncologist and pulmonologist, and you prefer radiation therapy. We will continue to follow up with your specialists and ensure that home care services are in place. We will also discuss the CAP program for home care with your case manager.  -CHRONIC PAIN: You are experiencing severe pain in your shoulder due to the cancer spread. This pain is being managed with oxycodone , which you should continue taking as prescribed. We will monitor you for any constipation, a common side effect of this medication.  -HYPERTENSION: Your high blood pressure is being managed with medication prescribed by your cardiology clinic. Please continue taking your blood pressure medication as directed.  -HIV: You are on antiretroviral therapy for HIV. Although you have not recently seen your infectious disease specialist, you should continue your current therapy and inform your specialist about your current health status.  -GENERAL HEALTH MAINTENANCE: You have recently quit smoking, which is excellent for your overall health. Please continue to avoid smoking.  INSTRUCTIONS:  Please continue to follow up with your oncologist and pulmonologist for your lung cancer treatment. Ensure that home care services are in place and discuss the CAP program for home care with your case manager. Continue taking your prescribed medications for pain, blood pressure, and HIV. Monitor for any constipation due to oxycodone   use. Inform your infectious disease specialist about your current health status. Keep up the great work with smoking cessation.

## 2024-04-10 NOTE — Telephone Encounter (Signed)
 Call returned to patient and he explained that he has not heard from anyone about the personal care help. I explained that the [facility] request was sent to [facility] on 03/22/2024 and he can call [employer] to check the status of the request.  I provided him with the number for the LTSS Team: [phone] and he said he will call.   He also asked about CAP services and I explained that he can call CAP to start the referral process and put himself on a wait list . I also provided him with the number for CAP/ [facility]: [phone].  I also explained that to qualify for CAP the provider needs to attest to the fact that he would require a nursing home or ALF level of care without the CAP services. He said he understood and will call CAP also

## 2024-04-11 ENCOUNTER — Encounter: Payer: Self-pay | Admitting: Acute Care

## 2024-04-11 ENCOUNTER — Ambulatory Visit: Admitting: Acute Care

## 2024-04-11 ENCOUNTER — Telehealth: Payer: Self-pay | Admitting: Acute Care

## 2024-04-11 ENCOUNTER — Encounter: Payer: Self-pay | Admitting: Medical Oncology

## 2024-04-11 VITALS — BP 166/90 | HR 92 | Ht 73.0 in | Wt 195.2 lb

## 2024-04-11 DIAGNOSIS — Z87891 Personal history of nicotine dependence: Secondary | ICD-10-CM

## 2024-04-11 DIAGNOSIS — M898X9 Other specified disorders of bone, unspecified site: Secondary | ICD-10-CM | POA: Diagnosis not present

## 2024-04-11 DIAGNOSIS — C3491 Malignant neoplasm of unspecified part of right bronchus or lung: Secondary | ICD-10-CM

## 2024-04-11 DIAGNOSIS — K118 Other diseases of salivary glands: Secondary | ICD-10-CM | POA: Diagnosis not present

## 2024-04-11 DIAGNOSIS — Z9889 Other specified postprocedural states: Secondary | ICD-10-CM

## 2024-04-11 NOTE — Progress Notes (Signed)
  [facility]  Diagnostic Nurse Navigator Treatment Team Hand-Off Note  04/11/24  Patient Name:  Benjamin Herrera Patient MRN:  [id number] Patient DOB:  [birth year]   Patient Care Team: Joaquin Mulberry, MD as PCP - General (Family Medicine) Alwin Baars Aretha Kubas, MD as PCP - Infectious Diseases (Infectious Diseases) Maximo Spar Aviva Lemmings, MD as PCP - Cardiology (Cardiology) Claudette Cue, MD (Inactive) as Consulting Physician (Gastroenterology) Candyce Champagne, MD as Consulting Physician (General Surgery)  Chief Complaint Malignant neoplasm of right lung, unspecified part of lung   Oncology History   No history exists.    Cancer Staging  No matching staging information was found for the patient.   SDOH Screening and Interventions Updated:  No  SDOH Screenings   Food Insecurity: Food Insecurity Present (11/08/2023)  Housing: Unknown (11/08/2023)  Transportation Needs: No Transportation Needs (11/08/2023)  Utilities: Not At Risk (11/08/2023)  Alcohol Screen: Low Risk  (11/08/2023)  Depression (PHQ2-9): High Risk (04/09/2024)  Financial Resource Strain: Low Risk  (11/08/2023)  Physical Activity: Inactive (11/08/2023)  Social Connections: Socially Isolated (11/08/2023)  Stress: No Stress Concern Present (11/08/2023)  Tobacco Use: Medium Risk (04/11/2024)  Health Literacy: Adequate Health Literacy (11/08/2023)     Genetics Assessment Completed:  No Genetics Referral Made:  no  Care Team Updated:  Yes   Esperanza Hedges, RN, BSN, [facility] Oncology Nurse Navigator, [facility] 04/11/2024 3:46 PM

## 2024-04-11 NOTE — Progress Notes (Signed)
 History of Present Illness Benjamin Herrera is a 58 y.o. male current every day smoker referred 03/2024 for evaluation of a lung nodule and lymphadenopathy. He will be followed by Dr. Baldwin Levee.    PMH includes OA, non compliant with CPAP, asthma, CAD, adhesive capitus, GERD, HIV, polysubstance abuse , left shoulder pain which upon CT Chest abdomen pelvis work up revealed a lesion on his left arm, lung nodule , and enlarged lymph nodes concerning for nodal metastasis. He underwent bronchoscopy with biopsies 04/02/2024, and is here today to review cytology and  ensure he is doing well after the procedure.    Pt. Has consented to use of [employer] soft wear to help capture the content of this OV .    04/11/2024 Pt. Referred for biopsy of a lung mass in setting of a large mass in the proximal humerous concerning for metastasis to the lung.  He underwent bronchoscopy with biopsies 04/03/2024. He states he did well after the procedure, he denies bleeding, fever, infection, worsening shortness of breath, or adverse reaction to anesthesia.  We have reviewed the results of his cytology. They were positive for  non-small cell lung cancer located in the right upper lobe of the lung. A PET scan was done which showed the right upper lobe mass was hypermetabolic, there was extensive hypermetabolic patho logic adenopathy in the chest, right lower neck also compatible with metastatic lymph adenopathy.  There were scattered lytic osseous metastatic lesions, and faintly accentuated metabolic activity in the left adrenal gland.  Additionally the right adrenal gland had an SUV of 3, the anterior right parotid gland had a nodule measuring 7 mm with an SUV of 4.9.  As the patient is an active smoker this could also potentially be a Warthin's tumor.  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  Per radiology potential considerations include metastatic disease, Pineocytoma, or other pineal parenchymal tumor.  There was notation of the 1.1 cm nonenhancing lesion in a left parotid gland and a 1.8 cm nonenhancing lesion in the right parotid tail which could potentially reflect parotid neoplasm.  Patient has an appointment with Dr. Liam Redhead 04/16/2024.  I have referred him to radiation oncology as I feel treatment will include combination therapy.  I have also referred to interventional radiology to have the parotid gland, which is enlarged on the right and palpable, biopsied under ultrasound.  Patient and his family member verbalized understanding of the above.  The patient states he has quit smoking for the last 2 weeks which is very encouraging.  He wants to do everything he can to live as long as he can.  We discussed today that he will need family support to help get him to appointments and support him through the process.  He states he has a large family network to help him.    Test Results: Cytology 04/03/2024 A. LUNG, RUL, FINE NEEDLE ASPIRATION:  - Adenocarcinoma   B. LUNG, RUL, BRUSHING:  - Suspicious for malignancy   C. LUNG, RUL, ENDOBRONCHIAL BRUSHING:  - Adenocarcinoma   D. LUNG, RUL, ENDOBROCHIAL BIOPSY:  - Adenocarcinoma   PET scan 04/09/2024 Hypermetabolic medial right upper lobe mass extending along the mediastinal margin, compatible with primary bronchogenic carcinoma. 2. Extensive hypermetabolic pathologic adenopathy in the chest and right lower neck, compatible with metastatic adenopathy. 3. Scattered lytic osseous metastatic disease  including the posterior right clavicle, the proximal left humerus (with pathologic fracture), the left sacrum, and the left L2 vertebral body. 4. Faintly accentuated metabolic activity in the left adrenal gland with maximum SUV of 4.3 but no well-defined adrenal mass. Right adrenal gland maximum SUV 3.0.  Surveillance suggested. 5. Anterior right parotid soft tissue density nodule measuring about 7 mm in diameter has maximum SUV of 4.9. This is nonspecific and could be a primary parotid neoplasm or a metastatic lymph node. If the patient is an active smoker, Warthin's tumor is likely; otherwise, otolaryngology referral for potential biopsy may be indicated. 6. Cholelithiasis. 7. Aortic Atherosclerosis (ICD10-I70.0) and Emphysema (ICD10-J43.9).   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 Differential considerations include metastatic disease, pineocytoma, or other pineal parenchymal tumor.   No abnormal enhancement within the brain parenchyma.   1.1 cm nonenhancing lesion within the superficial lobe of the left parotid gland. Additional 1.8 cm nonenhancing lesion in the right parotid tail which is partially visualized. Findings could reflect parotid neoplasms. Recommend correlation with tissue sampling.   Mild chronic microvascular ischemic changes.      Latest Ref Rng & Units 03/21/2024    2:04 PM 03/23/2023    4:17 PM 10/29/2021    2:45 AM  CBC  WBC 4.0 - 10.5 K/uL 3.9  4.5  7.5   Hemoglobin 13.0 - 17.0 g/dL 16.1  09.6  04.5   Hematocrit 39.0 - 52.0 % 42.3  42.8  47.3   Platelets 150 - 400 K/uL 185  194  189        Latest Ref Rng & Units 03/21/2024    2:04 PM 01/05/2024    2:21 PM 03/23/2023    4:17 PM  BMP  Glucose 70 - 99 mg/dL 70  64  82   BUN 6 - 20 mg/dL 9  16  9    Creatinine 0.61 - 1.24 mg/dL 4.09  8.11  9.14   BUN/Creat Ratio 9 - 20  16  8    Sodium 135 - 145 mmol/L 139  139  143   Potassium 3.5 - 5.1 mmol/L 3.6  3.7  3.0   Chloride 98 - 111 mmol/L 102  103  101   CO2 22 - 32 mmol/L 31  23  30    Calcium  8.9 - 10.3 mg/dL 9.7  9.0  9.4     BNP No results found for: BNP  ProBNP No results found for: PROBNP  PFT No  results found for: FEV1PRE, FEV1POST, FVCPRE, FVCPOST, TLC, DLCOUNC, PREFEV1FVCRT, PSTFEV1FVCRT  NM PET Image Initial (PI) Skull Base To Thigh Result Date: 04/09/2024 CLINICAL DATA:  Initial treatment strategy for lung cancer. EXAM: NUCLEAR MEDICINE PET SKULL BASE TO THIGH TECHNIQUE: 9.5 mCi F-18 FDG was injected intravenously. Full-ring PET imaging was performed from the skull base to thigh after the radiotracer. CT data was obtained and used for attenuation correction and anatomic localization. Fasting blood glucose: 90 mg/dl COMPARISON:  78/29/5621 FINDINGS: Mediastinal blood pool activity: SUV max 2.1 Liver activity: SUV max NA NECK: Hypermetabolic right level IV lymph node adjacent to hypermetabolic supraclavicular nodes. The right level IV node measures 0.6 cm in short axis on image 45 series 4 with maximum SUV 8.6. Anterior right parotid soft tissue density nodule measuring about 7 mm in diameter has maximum SUV of 4.9. Incidental CT findings: Unremarkable CHEST: Hypermetabolic right supraclavicular, right paratracheal,  right hilar, left paratracheal, paraesophageal, subcarinal, bilateral axillary, and left subpectoral pathologic adenopathy along with some small subcutaneous lymph nodes along the left lateral chest. Index right hilar node 2.3 cm in short axis with maximum SUV 23.0. Index subcarinal node 1.5 cm in short axis on image 77 series 4 with maximum SUV 20.7. The medial right upper lobe mass extending along the mediastinal margin measures of bowel 3.8 by 2.7 cm with maximum SUV 19.7. Mild nodularity believed to probably be in the right upper lobe along the margin of the major fissure measuring 0.9 by 0.5 cm on image 34 series 7 has maximum SUV of 2.1; exact relationship of this nodularity with the minor fissure is difficult to discern. Incidental CT findings: Atheromatous vascular calcification of the aortic arch and coronary arteries. Emphysema. Dilated esophagus. ABDOMEN/PELVIS:  Faintly accentuated metabolic activity in the left adrenal gland with maximum SUV of 4.3 but no well-defined adrenal mass. Right adrenal gland maximum SUV 3.0. Small bilateral inguinal lymph nodes have low-grade activity. Incidental CT findings: Atherosclerosis is present, including aortoiliac atherosclerotic disease. Cholelithiasis. SKELETON: Scattered lytic osseous metastatic disease including the posterior right clavicle, the proximal left humerus (with pathologic fracture) the left sacrum, and the left L2 vertebral body. Index proximal left humeral lesion with maximum SUV 20.6. Index lesion in the left L2 vertebral body measures 2.6 cm in long axis with maximum SUV 20.2. Incidental CT findings: None. IMPRESSION: 1. Hypermetabolic medial right upper lobe mass extending along the mediastinal margin, compatible with primary bronchogenic carcinoma. 2. Extensive hypermetabolic pathologic adenopathy in the chest and right lower neck, compatible with metastatic adenopathy. 3. Scattered lytic osseous metastatic disease including the posterior right clavicle, the proximal left humerus (with pathologic fracture), the left sacrum, and the left L2 vertebral body. 4. Faintly accentuated metabolic activity in the left adrenal gland with maximum SUV of 4.3 but no well-defined adrenal mass. Right adrenal gland maximum SUV 3.0. Surveillance suggested. 5. Anterior right parotid soft tissue density nodule measuring about 7 mm in diameter has maximum SUV of 4.9. This is nonspecific and could be a primary parotid neoplasm or a metastatic lymph node. If the patient is an active smoker, Warthin's tumor is likely; otherwise, otolaryngology referral for potential biopsy may be indicated. 6. Cholelithiasis. 7. Aortic Atherosclerosis (ICD10-I70.0) and Emphysema (ICD10-J43.9). Electronically Signed   By: Freida Jes M.D.   On: 04/09/2024 14:12   MR Brain W Wo Contrast Result Date: 04/06/2024 CLINICAL DATA:  New lung cancer  diagnosis, staging workup. EXAM: MRI HEAD WITHOUT AND WITH CONTRAST TECHNIQUE: Multiplanar, multiecho pulse sequences of the brain and surrounding structures were obtained without and with intravenous contrast. CONTRAST:  9mL GADAVIST GADOBUTROL 1 MMOL/ML IV SOLN COMPARISON:  None Available. FINDINGS: Brain: No acute infarct. No evidence of intracranial hemorrhage. Nonspecific scattered punctate foci of T2/FLAIR hyperintensity in the periventricular and subcortical white matter. There is a 1.2 x 1.0 x 0.9 cm enhancing mass in the region of the pineal gland abutting the tectum of the midbrain. The superior and slightly posterior aspect of the mass abuts the internal cerebral veins. There are areas of susceptibility within the mass suggestive of calcification which appear to be primarily within the peripheral aspect of the lesion. No edema, mass effect, or midline shift. The basilar cisterns are patent. No extra-axial fluid collections. Ventricles: Normal size and configuration of the ventricles. Vascular: Skull base flow voids are visualized. Skull and upper cervical spine: Degenerative changes in the visualized upper cervical spine. Visualized calvarium is unremarkable.  Sinuses/Orbits: Orbits are symmetric. Mild mucosal thickening in the right maxillary sinus. Other: Mastoid air cells are clear. There is a 0 0.7 x 0.7 x 1.1 cm T2 hyperintense lesion within the superficial lobe of the left parotid gland. Additional 1.8 x 1.8 cm nonenhancing lesion in the right parotid tail is partially visualized (series 1100, image 58). 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 Differential considerations include metastatic disease, pineocytoma, or other pineal parenchymal tumor. No abnormal enhancement within the brain parenchyma. 1.1 cm nonenhancing lesion within the superficial lobe of the left parotid gland. Additional  1.8 cm nonenhancing lesion in the right parotid tail which is partially visualized. Findings could reflect parotid neoplasms. Recommend correlation with tissue sampling. Mild chronic microvascular ischemic changes. Electronically Signed   By: Denny Flack M.D.   On: 04/06/2024 15:47   DG Chest Port 1 View Result Date: 04/03/2024 CLINICAL DATA:  Status post bronchoscopy EXAM: PORTABLE CHEST 1 VIEW COMPARISON:  October 29, 2021 FINDINGS: Mild bilateral interstitial prominence of the bases of the lungs similar to prior examination without other infiltrates consolidations or pulmonary edema. No pneumothorax IMPRESSION: Mild bilateral interstitial prominence of the bases of the lungs similar to prior examination without other infiltrates consolidations or pulmonary edema. Post bronchoscopy without evidence of pneumothorax Electronically Signed   By: Fredrich Jefferson M.D.   On: 04/03/2024 15:22   DG C-ARM BRONCHOSCOPY Result Date: 04/03/2024 C-ARM BRONCHOSCOPY: Fluoroscopy was utilized by the requesting physician.  No radiographic interpretation.   MR SHOULDER LEFT W WO CONTRAST Result Date: 03/30/2024 MR SHOULDER WITHOUT THEN WITH IV CONTRAST LEFT COMPARISON: X-ray 03/19/2024 CLINICAL HISTORY: History of cancer with left shoulder pain. PULSE SEQUENCES: Ax PD FS, Sag T2 FS, Cor T1 & COR T2 FS with and without 9 mL of IV contrast. FINDINGS: There is a large destructive mass replacing the proximal humeral metaphysis and diaphysis measuring approximately 4.4 x 4.9 x 6.6 cm in size. There is significant edema and destruction of the cortex and medullary bone. Findings are consistent with a large metastatic lesion in patient with history of lung mass. There is likely a pathologic fracture due to the degree of destruction of bone. Otherwise, limited evaluation of the bony structures are unremarkable. Rotator cuff, biceps tendon and labrum are grossly intact. IMPRESSION: Large mass replacing the proximal humeral metaphysis  diaphysis with destruction of the cortex. There is a soft tissue component surrounding the proximal humerus. Finding is consistent with metastatic disease in patient with history of lung mass. There is either a pathologic fracture or pending pathologic fracture present. Electronically signed by: Adrien Alberta MD 03/30/2024 09:49 AM EDT RP Workstation: [id number]   CT CHEST ABDOMEN PELVIS W CONTRAST Result Date: 03/26/2024 CLINICAL DATA:  Malignancy evaluation, lytic lesion of the left humerus identified by radiographs * Tracking Code: BO * EXAM: CT CHEST, ABDOMEN, AND PELVIS WITH CONTRAST TECHNIQUE: Multidetector CT imaging of the chest, abdomen and pelvis was performed following the standard protocol during bolus administration of intravenous contrast. RADIATION DOSE REDUCTION: This exam was performed according to the departmental dose-optimization program which includes automated exposure control, adjustment of the mA and/or kV according to patient size and/or use of iterative reconstruction technique. CONTRAST:  OMNIPAQUE  IOHEXOL  300 MG/ML  SOLN COMPARISON:  None Available. FINDINGS: CT CHEST FINDINGS Cardiovascular: Aortic atherosclerosis. Normal heart size. Left coronary artery calcifications. No pericardial effusion. Mediastinum/Nodes: Enlarged pretracheal and right hilar lymph nodes, right hilar  nodes measuring up to 2.7 x 2.0 cm (series 2, image 27). The esophagus is filled with fluid and radiopaque ingested material (series 2, image 34). Thyroid and trachea without significant findings. Lungs/Pleura: Mild, predominantly paraseptal emphysema. Spiculated mass of the medial anterior right apex with a large interface to the adjacent pleura measuring 4.1 x 3.0 cm (series 6, image 32). No pleural effusion or pneumothorax. Musculoskeletal: No chest wall abnormality. Lytic lesion of the proximal left humerus (series 2, image 70). CT ABDOMEN PELVIS FINDINGS Hepatobiliary: No solid liver abnormality is seen.  Small gallstone. No gallbladder wall thickening, or biliary dilatation. Pancreas: Unremarkable. No pancreatic ductal dilatation or surrounding inflammatory changes. Spleen: Normal in size without significant abnormality. Adrenals/Urinary Tract: Adrenal glands are unremarkable. Kidneys are normal, without renal calculi, solid lesion, or hydronephrosis. Small diverticulum of the posterior right aspect of the urinary bladder. Stomach/Bowel: Stomach is within normal limits. Appendix appears normal. No evidence of bowel wall thickening, distention, or inflammatory changes. Vascular/Lymphatic: Severe aortic atherosclerosis. No enlarged abdominal or pelvic lymph nodes. Reproductive: No mass or other abnormality. Other: No abdominal wall hernia or abnormality. No ascites. Musculoskeletal: No acute osseous findings. IMPRESSION: 1. Spiculated mass of the medial anterior right pulmonary apex with a large interface to the adjacent pleura measuring 4.1 x 3.0 cm, consistent with primary lung malignancy. 2. Enlarged pretracheal and right hilar lymph nodes, consistent with nodal metastatic disease. 3. Lytic lesion of the proximal left humerus, consistent with osseous metastatic disease. 4. No evidence of lymphadenopathy or metastatic disease in the abdomen or pelvis. 5. Emphysema. 6. Coronary artery disease. 7. Cholelithiasis. These results will be called to the ordering clinician or representative by the Radiologist Assistant, and communication documented in the PACS or [employer]. Aortic Atherosclerosis (ICD10-I70.0) and Emphysema (ICD10-J43.9). Electronically Signed   By: Fredricka Jenny M.D.   On: 03/26/2024 12:41   DG Shoulder Left Result Date: 03/19/2024 CLINICAL DATA:  Pain. EXAM: LEFT SHOULDER - 3 VIEW COMPARISON:  11/08/2023. FINDINGS: No acute fracture, dislocation or subluxation. There is an osteolytic lesion with ill-defined margins consistent with a neoplastic process involving the proximal humerus. IMPRESSION:  Proximal humeral lytic lesion, likely neoplastic. No acute fracture. Electronically Signed   By: Sydell Eva M.D.   On: 03/19/2024 12:09     Past medical hx Past Medical History:  Diagnosis Date   Achalasia and cardiospasm    Allergy    Anxiety    Arthritis    back ,shoulders    Asthma    Boils    under arms hx of   CAD (coronary artery disease)    a. 09/2016: cath showing 100% RCA occlusion with collaterals and 90% OM1 (treated with DES)   Candidiasis of mouth    Candidiasis of unspecified site    Cholelithiasis    Cocaine abuse (HCC)    Quit 09/2016   Condyloma acuminatum in male of scrotum & anal canal s/p laser ablation 07/03/2012   Coronary artery disease    Dysphagia    Fracture, humerus    Frozen shoulder    left   GERD (gastroesophageal reflux disease)    Heart attack (HCC)    Hemorrhoids    Hepatitis 1993   A   Herpes labialis    HIV (human immunodeficiency virus infection) (HCC) DX 1993   Hypertension Dx 2014   off all bp meds for last 9 or 10 months   Pilonidal disease 01/10/2012   Polysubstance abuse (HCC)    Psoriasis  hx of   Sleep apnea    does  have CPAP   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/03/2012   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 His smoking use included cigarettes. He started smoking about 42 years ago.  He has a 42.5 pack-year smoking history. He has never used smokeless tobacco. He reports current drug use. Frequency: 3.00 times per week. Drug: Marijuana. He reports that he does not drink alcohol.  Tobacco Cessation: Counseling given: Not Answered Tobacco comments: 4 or 5 cigarettes a day  States quit one week ago 04/02/2024 42-pack-year smoking history. Patient quit smoking 2 weeks ago  Past surgical hx, Family hx, Social hx all reviewed.  Current Outpatient Medications on File Prior to Visit  Medication Sig   albuterol  (VENTOLIN  HFA) 108 (90 Base) MCG/ACT inhaler Inhale 2 puffs into the lungs every 6 (six) hours as needed for wheezing or shortness of breath.   amLODipine  (NORVASC ) 5 MG tablet Take 1 tablet (5 mg total) by mouth daily.   aspirin  81 MG chewable tablet Chew 1 tablet (81 mg total) by mouth daily.   BIKTARVY  30-120-15 MG TABS TAKE 1 TABLET BY MOUTH EVERY DAY   Blood Pressure KIT 1 kit by Does not apply route daily.   diclofenac  Sodium (VOLTAREN  ARTHRITIS PAIN) 1 % GEL Apply 2 g topically 4 (four) times daily.   methocarbamol  (ROBAXIN ) 500 MG tablet Take 2 tablets (1,000 mg total) by mouth every 8 (eight) hours as needed.   metoprolol  succinate (TOPROL -XL) 25 MG 24 hr tablet TAKE 1/2 TABLET BY MOUTH DAILY   nitroGLYCERIN  (NITROSTAT ) 0.4 MG SL tablet Place 1 tablet (0.4 mg total) under the tongue every 5 (five) minutes as needed for chest pain (trouble swallowing).   meloxicam  (MOBIC ) 15 MG tablet Take 1 tablet (15 mg total) by mouth daily. (Patient not taking: Reported on 04/11/2024)   No current facility-administered medications on file prior to visit.     Allergies  Allergen Reactions   Citrus Swelling    Mouth itching and swelling     Review Of Systems:  Constitutional:   Positive weight loss, no night sweats,  Fevers, chills, fatigue, or  lassitude.  HEENT:   No headaches,  Difficulty swallowing,  Tooth/dental problems, or  Sore throat,                No  sneezing, itching, ear ache, nasal congestion, post nasal drip,   CV:  No chest pain,  Orthopnea, PND, swelling in lower extremities, anasarca, dizziness, palpitations, syncope.   GI  No heartburn, indigestion, abdominal pain, nausea, vomiting, diarrhea, change in bowel habits, loss of appetite, bloody stools.   Resp: No shortness of  breath with exertion or at rest.  No excess mucus, no productive cough,  No non-productive cough,  No coughing up of blood.  No change in color of mucus.  No wheezing.  No chest wall deformity  Skin: no rash or lesions.  GU: no dysuria, change in color of urine, no urgency or frequency.  No flank pain, no hematuria   MS:  + joint pain or swelling.  No decreased range of motion.  No back pain.+ Left arm /shoulder pain  Psych:  No change in mood or affect. No depression or anxiety.  No memory loss.  Appropriately concerned   Vital Signs BP (!) 166/90 (BP Location: Right Arm, Cuff Size: Normal)   Pulse 92   Ht 6' 1 (1.854 m)   Wt 195 lb 3.2 oz (88.5 kg)   SpO2 99%   BMI 25.75 kg/m    Physical Exam:  General- No distress,  A&Ox3, pleasant ENT: No sinus tenderness, TM clear, pale nasal mucosa, no oral exudate,no post nasal drip,  + LAN, specifically a palpable enlarged anterior right parotid gland Cardiac: S1, S2, regular rate and rhythm, no murmur Chest: No wheeze/ rales/ dullness; no accessory muscle use, no nasal flaring, no sternal retractions Abd.: Soft Non-tender, nondistended, bowel sounds positive, Body mass index is 25.75 kg/m.  Ext: No clubbing cyanosis, edema, left arm is in sling. Neuro:  normal strength, moving all extremities x 4, alert and oriented x 3 Skin: No rashes, warm and dry, no obvious skin lesions Psych: normal mood and behavior   Assessment/Plan New diagnosis squamous cell carcinoma of the lung PET scan concerning for lymphogenic spread MRI brain concerning for lesion Enlarged parotid gland on the right Scattered lytic  osseous metastatic disease Former smoker quit 2 weeks ago with a 42-pack-year smoking history Post bronchoscopy with biopsies Plan I am glad you did well after the bronchoscopy with biopsies. 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 This is a non small cell lung cancer.  The PET scan we did does show that there is suspicion that lymph nodes are involved. The MR Brain does show a lesion that will need to be further evaluated.  You have an appointment with Dr. Marguerita Shih 04/16/2024. I have referred you to radiation oncology also. You will get a call to get this scheduled. You will most likely need a biopsy of the right parotid gland which was also positive on the PET scan.  If this gets ordered, you will get a call to schedule.  Please work on quitting smoking.  You can receive free nicotine replacement therapy (patches, gum or mints) by calling 1-800-QUIT NOW. Please call so we can get you on the path to becoming a non-smoker. I know it is hard, but you can do this!  Hypnosis for smoking cessation  [employer] [phone]  Acupuncture for smoking cessation  [employer] [phone]   You will get great care at the [facility]. Call us  if you need us . Please contact office for sooner follow up if symptoms do not improve or worsen or seek emergency care    I spent 45 minutes dedicated to the care of this patient on the date of this encounter to include pre-visit review of records, face-to-face time with the patient discussing conditions above, post visit ordering of testing, clinical documentation with the electronic health record, making appropriate referrals as documented, and communicating necessary information to the patient's healthcare team.   Raejean Bullock, NP 04/11/2024  7:33 PM

## 2024-04-11 NOTE — Telephone Encounter (Signed)
 I have called Benjamin Herrera to let him know that I have placed an order to interventional radiology for a biopsy of the right parotid gland.  I just wanted to make sure he was aware the order had been placed and that he would be getting a phone call to get this scheduled.  He verbalized understanding of the above and had no further questions at completion of the call.

## 2024-04-11 NOTE — Patient Instructions (Addendum)
 It is good to see you today. I am glad you did well after the bronchoscopy with biopsies. 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 This is a non small cell lung cancer.  The PET scan we did does show that there is suspicion that lymph nodes are involved. The MR Brain does show a lesion that will need to be further evaluated.  You have an appointment with Dr. Marguerita Shih 04/16/2024. I have referred you to radiation oncology also. You will get a call to get this scheduled. You will most likely need a biopsy of the right parotid gland which was also positive on the PET scan.  Please work on quitting smoking.  You can receive free nicotine replacement therapy (patches, gum, or mints) by calling 1-800-QUIT NOW. Please call so we can get you on the path to becoming a non-smoker. I know it is hard, but you can do this!  Hypnosis for smoking cessation  [employer] [phone]  Acupuncture for smoking cessation  [employer] [phone]   You will get great care at the [facility]. Call us  if you need us . Please contact office for sooner follow up if symptoms do not improve or worsen or seek emergency care

## 2024-04-12 ENCOUNTER — Encounter: Payer: Self-pay | Admitting: *Deleted

## 2024-04-12 ENCOUNTER — Other Ambulatory Visit: Payer: Self-pay

## 2024-04-12 NOTE — Progress Notes (Unsigned)
 Marland Silvas, MD  [facility] PROCEDURE / BIOPSY REVIEW Date: 04/11/24  Requested Biopsy site: R parotid nodule Reason for request: PET+ Imaging review: Best seen on PET 04/09/24  Decision: Approved Imaging modality to perform: Ultrasound Schedule with: No sedation / Local anesthetic Schedule for: Any VIR  Additional comments:   Please contact me with questions, concerns, or if issue pertaining to this request arise.  Dayne Marland Silvas, MD [facility]

## 2024-04-12 NOTE — Progress Notes (Incomplete)
 Radiation Oncology         [phone]) [phone] ________________________________  Name: Benjamin Herrera        MRN: [id number]  Date of Service: 04/13/2024 DOB: 1966/03/11  Initial Outpatient Consultation - Conducted via telephone at patient request.   I spoke with the patient to conduct this consult visit via telephone. The patient was notified in advance and was offered an in person or telemedicine meeting to allow for face to face communication but instead preferred to proceed with a telephone consult.    NU:UVOZDG, Benjamin Posey, MD  Benjamin Flake, MD     REFERRING PHYSICIAN: Denson Flake, MD  DIAGNOSIS: The primary encounter diagnosis was Malignant neoplasm of right upper lobe of lung (HCC). A diagnosis of Secondary malignant neoplasm of bone [facility]) was also pertinent to this visit.   Stage IVB (cT2b, N3, M1c) adenocarcinoma, NSCLC, of the RUL  HISTORY OF PRESENT ILLNESS: Benjamin Herrera is a 58 y.o. male with a history of HIV, polysubstance abuse, and current every day smoker, seen at the request of Dr. Baldwin Levee for a newly diagnosed lung cancer.  He originally presented to his PCP on 11/08/2023 with complaints of left shoulder pain. X-ray performed that day showed no acute fracture. Patient was prescribed PT and was evaluated by orthopedist, Dr. Hermina Loosen, on 01/13/2024. He was treated with a steroid injection due to concerns for adhesive capsulitis. His pain persisted and he presented to Dr. Guyann Leitz on 03/07/2024. X-rays performed at that time showed an invasive lesion of his left proximal humerus with extensive erosive changes of his lateral cortex of his proximal humerus immediately distal to his surgical neck. Patient was subsequently referred to orthopedic oncology. Prior to his consultation, patient presented to the ED on 03/19/2024 for worsening shoulder pain. Dr. Guyann Leitz ordered an MRI of the left shoulder and recommended follow-up with the [facility]. Patient met with Dr. Rosaline Coma and  Kaitlyn Walisiewicz PA-C on 03/21/2024. Per their recommendations, he proceeded with a CT CAP on 03/26/2024 to assist with work-up. Imaging showed a spiculated 4.1 cm mass in the apex of the right lung; enlarged pretracheal and right hilar lymph nodes; and a lytic lesion of the proximal left humerus. Accordingly, patient underwent biopsy of the right lung mass on 04/03/2024. Findings were consistent with adenocarcinoma of the lung. MRI of the shoulder ordered by Dr. Guyann Leitz performed on 03/30/2024 demonstrated a large mass replacing the proximal humeral metaphysis diaphysis with destruction of the cortex, consistent with metastatic disease. 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 PET on 04/09/2024 additionally demonstrated scattered lytic osseous metastatic disease, and an an anterior right parotid soft tissue nodule.   Patient was kindly referred to us  today to discuss radiation treatment options. He is scheduled to meet with Dr. Marguerita Shih on 04/16/2024.   Patient complains of 10 /10 left shoulder pain when not on pain medication.  He takes oxycodone  10 mg 3 times daily oxycodone  10 mg TID. He states the pain radiates down his left arm, but denies any radiating numbness or tingling. His keeps his arm in a sling and is having to sleep in a recliner to control his pain at night.   Patient quit smoking when he was originally diagnosed with lung cancer a couple weeks ago. Prior to this he smoked approximately 1/2 ppd for 35  years.    PREVIOUS RADIATION THERAPY: No   PAST MEDICAL HISTORY:  Past Medical History:  Diagnosis Date   Achalasia and cardiospasm    Allergy    Anxiety    Arthritis    back ,shoulders    Asthma    Boils    under arms hx of   CAD (coronary artery disease)    a. 09/2016: cath showing 100% RCA occlusion with collaterals and 90% OM1  (treated with DES)   Candidiasis of mouth    Candidiasis of unspecified site    Cholelithiasis    Cocaine abuse (HCC)    Quit 09/2016   Condyloma acuminatum in male of scrotum & anal canal s/p laser ablation 07/03/2012   Coronary artery disease    Dysphagia    Fracture, humerus    Frozen shoulder    left   GERD (gastroesophageal reflux disease)    Heart attack (HCC)    Hemorrhoids    Hepatitis 1993   A   Herpes labialis    HIV (human immunodeficiency virus infection) (HCC) DX 1993   Hypertension Dx 2014   off all bp meds for last 9 or 10 months   Pilonidal disease 01/10/2012   Polysubstance abuse (HCC)    Psoriasis    hx of   Sleep apnea    does  have CPAP   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/03/2012   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 08/03/2012       PAST SURGICAL HISTORY: Past Surgical History:  Procedure Laterality Date   BALLOON DILATION N/A 11/11/2015   Procedure: BALLOON DILATION;  Surgeon: Sergio Dandy, MD;  Location: [facility];  Service: Endoscopy;  Laterality: N/A;   BOTOX  INJECTION  10/11/2012   Procedure: BOTOX  INJECTION;  Surgeon: Claudette Cue, MD;  Location: [facility];  Service: Endoscopy;  Laterality: N/A;   BOTOX  INJECTION N/A 01/25/2013   Procedure: BOTOX  INJECTION;  Surgeon: Claudette Cue, MD;  Location: [facility];  Service: Endoscopy;  Laterality: N/A;   BOTOX  INJECTION N/A 06/21/2013   Procedure: BOTOX  INJECTION;  Surgeon: Claudette Cue, MD;  Location: [facility];  Service: Endoscopy;  Laterality: N/A;   BOTOX  INJECTION N/A 10/08/2013   Procedure: BOTOX  INJECTION;  Surgeon: Claudette Cue, MD;  Location: [facility];  Service: Endoscopy;  Laterality: N/A;   BOTOX  INJECTION N/A 06/16/2015   Procedure: BOTOX  INJECTION;  Surgeon: Claudette Cue, MD;  Location: [facility];  Service: Endoscopy;  Laterality: N/A;   BRONCHIAL BIOPSY  04/03/2024   Procedure:  BRONCHOSCOPY, WITH BIOPSY;  Surgeon: Benjamin Flake, MD;  Location: [facility] ENDOSCOPY;  Service: Pulmonary;;   BRONCHIAL BRUSHINGS  04/03/2024   Procedure: BRONCHOSCOPY, WITH BRUSH BIOPSY;  Surgeon: Benjamin Flake, MD;  Location: [facility]OSCOPY;  Service: Pulmonary;;   BRONCHIAL NEEDLE ASPIRATION BIOPSY  04/03/2024   Procedure: BRONCHOSCOPY, WITH NEEDLE ASPIRATION BIOPSY;  Surgeon: Benjamin Flake, MD;  Location: [facility];  Service: Pulmonary;;   BRONCHOSCOPY, WITH BIOPSY USING ELECTROMAGNETIC NAVIGATION Right 04/03/2024   Procedure: BRONCHOSCOPY, WITH BIOPSY USING ELECTROMAGNETIC NAVIGATION;  Surgeon: Benjamin Flake, MD;  Location: [facility];  Service: Pulmonary;  Laterality: Right;   CARDIAC CATHETERIZATION N/A 10/11/2016   Procedure: Left Heart Cath and Coronary Angiography;  Surgeon: Odie Benne, MD;  mRCA 100% w/ L>R collaterals, OM1 90%   CARDIAC CATHETERIZATION N/A 10/11/2016   Procedure: Coronary Stent Intervention;  Surgeon: Coy Ditty  Abel Hoe, MD;  Catalina Climes UEAV 4.0J81 DES OM1   COLONOSCOPY  05/19/2012   Procedure: COLONOSCOPY;  Surgeon: Claudette Cue, MD;  Location: [facility];  Service: Endoscopy;  Laterality: N/A;  jill trying to contact pt to come in 0830 for 930 case, phone not accepting messages   COLONOSCOPY     ESOPHAGEAL MANOMETRY  05/29/2012   Procedure: ESOPHAGEAL MANOMETRY (EM);  Surgeon: Claudette Cue, MD;  Location: [facility];  Service: Endoscopy;  Laterality: N/A;   ESOPHAGEAL MANOMETRY N/A 10/06/2015   Procedure: ESOPHAGEAL MANOMETRY (EM);  Surgeon: Sergio Dandy, MD;  Location: [facility];  Service: Endoscopy;  Laterality: N/A;   ESOPHAGOGASTRODUODENOSCOPY  11/11/2011   Procedure: ESOPHAGOGASTRODUODENOSCOPY (EGD);  Surgeon: Claudette Cue, MD;  Location: [facility];  Service: Endoscopy;  Laterality: N/A;  botox  injection  called Pt to change time of procedure per Dr Arvie Latus   ESOPHAGOGASTRODUODENOSCOPY  03/10/2012   Procedure:  ESOPHAGOGASTRODUODENOSCOPY (EGD);  Surgeon: Claudette Cue, MD;  Location: [facility];  Service: Endoscopy;  Laterality: N/A;   ESOPHAGOGASTRODUODENOSCOPY  10/11/2012   Procedure: ESOPHAGOGASTRODUODENOSCOPY (EGD);  Surgeon: Claudette Cue, MD;  Location: [facility];  Service: Endoscopy;  Laterality: N/A;   ESOPHAGOGASTRODUODENOSCOPY N/A 01/25/2013   Procedure: ESOPHAGOGASTRODUODENOSCOPY (EGD);  Surgeon: Claudette Cue, MD;  Location: [facility];  Service: Endoscopy;  Laterality: N/A;   ESOPHAGOGASTRODUODENOSCOPY N/A 06/21/2013   Procedure: ESOPHAGOGASTRODUODENOSCOPY (EGD);  Surgeon: Claudette Cue, MD;  Location: [facility];  Service: Endoscopy;  Laterality: N/A;   ESOPHAGOGASTRODUODENOSCOPY (EGD) WITH PROPOFOL  N/A 10/08/2013   Procedure: ESOPHAGOGASTRODUODENOSCOPY (EGD) WITH PROPOFOL ;  Surgeon: Claudette Cue, MD;  Location: [facility];  Service: Endoscopy;  Laterality: N/A;   ESOPHAGOGASTRODUODENOSCOPY (EGD) WITH PROPOFOL  N/A 06/16/2015   Procedure: ESOPHAGOGASTRODUODENOSCOPY (EGD) WITH PROPOFOL ;  Surgeon: Claudette Cue, MD;  Location: [facility];  Service: Endoscopy;  Laterality: N/A;   ESOPHAGOGASTRODUODENOSCOPY (EGD) WITH PROPOFOL  N/A 11/11/2015   Procedure: ESOPHAGOGASTRODUODENOSCOPY (EGD) WITH PROPOFOL ;  Surgeon: Sergio Dandy, MD;  Location: [facility];  Service: Endoscopy;  Laterality: N/A;   EXAMINATION UNDER ANESTHESIA  07/24/2012   Procedure: EXAM UNDER ANESTHESIA;  Surgeon: Eddye Goodie, MD;  Location: [facility];  Service: General;  Laterality: N/A;   humeral fracture surgery Right yrs ago   PILONIDAL CYST EXCISION  07/24/2012   Procedure: CYST EXCISION PILONIDAL SIMPLE;  Surgeon: Eddye Goodie, MD;  Location: [facility];  Service: General;  Laterality: N/A;  Exam Under Anesthesia,, Excision Pilonidal Disease,    right shoulder replacement  2008   x 2   UPPER GASTROINTESTINAL ENDOSCOPY     WART FULGURATION  07/24/2012   Procedure: FULGURATION ANAL WART;  Surgeon:  Eddye Goodie, MD;  Location: [facility];  Service: General;  Laterality: N/A;  excision of raphe mass     FAMILY HISTORY:  Family History  Problem Relation Age of Onset   Arthritis Mother    Hypertension Mother    Heart disease Mother        MI age 10   Heart attack Sister    Diabetes Maternal Grandmother    Stomach cancer Paternal Grandmother    Colon cancer Neg Hx    Colon polyps Neg Hx    Esophageal cancer Neg Hx    Rectal cancer Neg Hx      SOCIAL HISTORY:  reports that he has quit smoking. His smoking use included cigarettes. He started smoking about 42 years ago. He has a 42.5 pack-year smoking history. He has never used smokeless  tobacco. He reports current drug use. Frequency: 3.00 times per week. Drug: Marijuana. He reports that he does not drink alcohol.   ALLERGIES: Citrus   MEDICATIONS:  Current Outpatient Medications  Medication Sig Dispense Refill   acetaminophen  (TYLENOL ) 325 MG tablet Take 650 mg by mouth every 6 (six) hours as needed for mild pain (pain score 1-3), moderate pain (pain score 4-6) or headache.     albuterol  (VENTOLIN  HFA) 108 (90 Base) MCG/ACT inhaler Inhale 2 puffs into the lungs every 6 (six) hours as needed for wheezing or shortness of breath. 1 each 2   amLODipine  (NORVASC ) 5 MG tablet Take 1 tablet (5 mg total) by mouth daily. 90 tablet 3   aspirin  81 MG chewable tablet Chew 1 tablet (81 mg total) by mouth daily. 90 tablet 3   BIKTARVY  30-120-15 MG TABS TAKE 1 TABLET BY MOUTH EVERY DAY 30 tablet 11   Blood Pressure KIT 1 kit by Does not apply route daily. 1 kit 0   diclofenac  Sodium (VOLTAREN  ARTHRITIS PAIN) 1 % GEL Apply 2 g topically 4 (four) times daily. 100 g 3   methocarbamol  (ROBAXIN ) 500 MG tablet Take 2 tablets (1,000 mg total) by mouth every 8 (eight) hours as needed. 90 tablet 1   metoprolol  succinate (TOPROL -XL) 25 MG 24 hr tablet TAKE 1/2 TABLET BY MOUTH DAILY 45 tablet 0   nitroGLYCERIN  (NITROSTAT ) 0.4 MG SL tablet Place 1 tablet  (0.4 mg total) under the tongue every 5 (five) minutes as needed for chest pain (trouble swallowing). 25 tablet 1   meloxicam  (MOBIC ) 15 MG tablet Take 1 tablet (15 mg total) by mouth daily. (Patient not taking: Reported on 04/13/2024) 30 tablet 1   No current facility-administered medications for this encounter.     REVIEW OF SYSTEMS: Notable for that above.     PHYSICAL EXAM:  Wt Readings from Last 3 Encounters:  04/13/24 195 lb (88.5 kg)  04/11/24 195 lb 3.2 oz (88.5 kg)  04/09/24 195 lb 12.8 oz (88.8 kg)   Temp Readings from Last 3 Encounters:  04/03/24 98.5 F (36.9 C)  03/21/24 97.9 F (36.6 C) (Temporal)  03/19/24 98.2 F (36.8 C) (Oral)   BP Readings from Last 3 Encounters:  04/11/24 (!) 166/90  04/09/24 (!) 143/75  04/03/24 130/75   Pulse Readings from Last 3 Encounters:  04/11/24 92  04/09/24 74  04/03/24 77   Pain Assessment Pain Score: 5  Pain Loc: Shoulder (Left Shoulder)/10  Deferred, due to nature of the telephone visit.     ECOG = 2  0 - Asymptomatic (Fully active, able to carry on all predisease activities without restriction)  1 - Symptomatic but completely ambulatory (Restricted in physically strenuous activity but ambulatory and able to carry out work of a light or sedentary nature. For example, light housework, office work)  2 - Symptomatic, <50% in bed during the day (Ambulatory and capable of all self care but unable to carry out any work activities. Up and about more than 50% of waking hours)  3 - Symptomatic, >50% in bed, but not bedbound (Capable of only limited self-care, confined to bed or chair 50% or more of waking hours)  4 - Bedbound (Completely disabled. Cannot carry on any self-care. Totally confined to bed or chair)  5 - Death   Aurea Blossom MM, Creech RH, Tormey DC, et al. [phone]). Toxicity and response criteria of the [facility]. Am. Hillard Lowes. Oncol. 5 (6): 649-55    LABORATORY  DATA:  Lab Results   Component Value Date   WBC 3.9 (L) 03/21/2024   HGB 13.6 03/21/2024   HCT 42.3 03/21/2024   MCV 78.0 (L) 03/21/2024   PLT 185 03/21/2024   Lab Results  Component Value Date   NA 139 03/21/2024   K 3.6 03/21/2024   CL 102 03/21/2024   CO2 31 03/21/2024   Lab Results  Component Value Date   ALT 9 03/21/2024   AST 14 (L) 03/21/2024   ALKPHOS 83 03/21/2024   BILITOT 0.5 03/21/2024      RADIOGRAPHY: NM PET Image Initial (PI) Skull Base To Thigh Result Date: 04/09/2024 CLINICAL DATA:  Initial treatment strategy for lung cancer. EXAM: NUCLEAR MEDICINE PET SKULL BASE TO THIGH TECHNIQUE: 9.5 mCi F-18 FDG was injected intravenously. Full-ring PET imaging was performed from the skull base to thigh after the radiotracer. CT data was obtained and used for attenuation correction and anatomic localization. Fasting blood glucose: 90 mg/dl COMPARISON:  52/84/1324 FINDINGS: Mediastinal blood pool activity: SUV max 2.1 Liver activity: SUV max NA NECK: Hypermetabolic right level IV lymph node adjacent to hypermetabolic supraclavicular nodes. The right level IV node measures 0.6 cm in short axis on image 45 series 4 with maximum SUV 8.6. Anterior right parotid soft tissue density nodule measuring about 7 mm in diameter has maximum SUV of 4.9. Incidental CT findings: Unremarkable CHEST: Hypermetabolic right supraclavicular, right paratracheal, right hilar, left paratracheal, paraesophageal, subcarinal, bilateral axillary, and left subpectoral pathologic adenopathy along with some small subcutaneous lymph nodes along the left lateral chest. Index right hilar node 2.3 cm in short axis with maximum SUV 23.0. Index subcarinal node 1.5 cm in short axis on image 77 series 4 with maximum SUV 20.7. The medial right upper lobe mass extending along the mediastinal margin measures of bowel 3.8 by 2.7 cm with maximum SUV 19.7. Mild nodularity believed to probably be in the right upper lobe along the margin of the major  fissure measuring 0.9 by 0.5 cm on image 34 series 7 has maximum SUV of 2.1; exact relationship of this nodularity with the minor fissure is difficult to discern. Incidental CT findings: Atheromatous vascular calcification of the aortic arch and coronary arteries. Emphysema. Dilated esophagus. ABDOMEN/PELVIS: Faintly accentuated metabolic activity in the left adrenal gland with maximum SUV of 4.3 but no well-defined adrenal mass. Right adrenal gland maximum SUV 3.0. Small bilateral inguinal lymph nodes have low-grade activity. Incidental CT findings: Atherosclerosis is present, including aortoiliac atherosclerotic disease. Cholelithiasis. SKELETON: Scattered lytic osseous metastatic disease including the posterior right clavicle, the proximal left humerus (with pathologic fracture) the left sacrum, and the left L2 vertebral body. Index proximal left humeral lesion with maximum SUV 20.6. Index lesion in the left L2 vertebral body measures 2.6 cm in long axis with maximum SUV 20.2. Incidental CT findings: None. IMPRESSION: 1. Hypermetabolic medial right upper lobe mass extending along the mediastinal margin, compatible with primary bronchogenic carcinoma. 2. Extensive hypermetabolic pathologic adenopathy in the chest and right lower neck, compatible with metastatic adenopathy. 3. Scattered lytic osseous metastatic disease including the posterior right clavicle, the proximal left humerus (with pathologic fracture), the left sacrum, and the left L2 vertebral body. 4. Faintly accentuated metabolic activity in the left adrenal gland with maximum SUV of 4.3 but no well-defined adrenal mass. Right adrenal gland maximum SUV 3.0. Surveillance suggested. 5. Anterior right parotid soft tissue density nodule measuring about 7 mm in diameter has maximum SUV of 4.9. This is nonspecific and could be  a primary parotid neoplasm or a metastatic lymph node. If the patient is an active smoker, Warthin's tumor is likely; otherwise,  otolaryngology referral for potential biopsy may be indicated. 6. Cholelithiasis. 7. Aortic Atherosclerosis (ICD10-I70.0) and Emphysema (ICD10-J43.9). Electronically Signed   By: Freida Jes M.D.   On: 04/09/2024 14:12   MR Brain W Wo Contrast Result Date: 04/06/2024 CLINICAL DATA:  New lung cancer diagnosis, staging workup. EXAM: MRI HEAD WITHOUT AND WITH CONTRAST TECHNIQUE: Multiplanar, multiecho pulse sequences of the brain and surrounding structures were obtained without and with intravenous contrast. CONTRAST:  9mL GADAVIST GADOBUTROL 1 MMOL/ML IV SOLN COMPARISON:  None Available. FINDINGS: Brain: No acute infarct. No evidence of intracranial hemorrhage. Nonspecific scattered punctate foci of T2/FLAIR hyperintensity in the periventricular and subcortical white matter. There is a 1.2 x 1.0 x 0.9 cm enhancing mass in the region of the pineal gland abutting the tectum of the midbrain. The superior and slightly posterior aspect of the mass abuts the internal cerebral veins. There are areas of susceptibility within the mass suggestive of calcification which appear to be primarily within the peripheral aspect of the lesion. No edema, mass effect, or midline shift. The basilar cisterns are patent. No extra-axial fluid collections. Ventricles: Normal size and configuration of the ventricles. Vascular: Skull base flow voids are visualized. Skull and upper cervical spine: Degenerative changes in the visualized upper cervical spine. Visualized calvarium is unremarkable. Sinuses/Orbits: Orbits are symmetric. Mild mucosal thickening in the right maxillary sinus. Other: Mastoid air cells are clear. There is a 0 0.7 x 0.7 x 1.1 cm T2 hyperintense lesion within the superficial lobe of the left parotid gland. Additional 1.8 x 1.8 cm nonenhancing lesion in the right parotid tail is partially visualized (series 1100, image 58). 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 Differential considerations include metastatic disease, pineocytoma, or other pineal parenchymal tumor. No abnormal enhancement within the brain parenchyma. 1.1 cm nonenhancing lesion within the superficial lobe of the left parotid gland. Additional 1.8 cm nonenhancing lesion in the right parotid tail which is partially visualized. Findings could reflect parotid neoplasms. Recommend correlation with tissue sampling. Mild chronic microvascular ischemic changes. Electronically Signed   By: Denny Flack M.D.   On: 04/06/2024 15:47   DG Chest Port 1 View Result Date: 04/03/2024 CLINICAL DATA:  Status post bronchoscopy EXAM: PORTABLE CHEST 1 VIEW COMPARISON:  October 29, 2021 FINDINGS: Mild bilateral interstitial prominence of the bases of the lungs similar to prior examination without other infiltrates consolidations or pulmonary edema. No pneumothorax IMPRESSION: Mild bilateral interstitial prominence of the bases of the lungs similar to prior examination without other infiltrates consolidations or pulmonary edema. Post bronchoscopy without evidence of pneumothorax Electronically Signed   By: Fredrich Jefferson M.D.   On: 04/03/2024 15:22   DG C-ARM BRONCHOSCOPY Result Date: 04/03/2024 C-ARM BRONCHOSCOPY: Fluoroscopy was utilized by the requesting physician.  No radiographic interpretation.   MR SHOULDER LEFT W WO CONTRAST Result Date: 03/30/2024 MR SHOULDER WITHOUT THEN WITH IV CONTRAST LEFT COMPARISON: X-ray 03/19/2024 CLINICAL HISTORY: History of cancer with left shoulder pain. PULSE SEQUENCES: Ax PD FS, Sag T2 FS, Cor T1 & COR T2 FS with and without 9 mL of IV contrast. FINDINGS: There is a large destructive mass replacing the proximal humeral metaphysis and diaphysis measuring approximately 4.4 x 4.9 x 6.6 cm in size. There is significant edema and destruction of the cortex and medullary bone.  Findings are consistent with a large metastatic  lesion in patient with history of lung mass. There is likely a pathologic fracture due to the degree of destruction of bone. Otherwise, limited evaluation of the bony structures are unremarkable. Rotator cuff, biceps tendon and labrum are grossly intact. IMPRESSION: Large mass replacing the proximal humeral metaphysis diaphysis with destruction of the cortex. There is a soft tissue component surrounding the proximal humerus. Finding is consistent with metastatic disease in patient with history of lung mass. There is either a pathologic fracture or pending pathologic fracture present. Electronically signed by: Adrien Alberta MD 03/30/2024 09:49 AM EDT RP Workstation: [id number]   CT CHEST ABDOMEN PELVIS W CONTRAST Result Date: 03/26/2024 CLINICAL DATA:  Malignancy evaluation, lytic lesion of the left humerus identified by radiographs * Tracking Code: BO * EXAM: CT CHEST, ABDOMEN, AND PELVIS WITH CONTRAST TECHNIQUE: Multidetector CT imaging of the chest, abdomen and pelvis was performed following the standard protocol during bolus administration of intravenous contrast. RADIATION DOSE REDUCTION: This exam was performed according to the departmental dose-optimization program which includes automated exposure control, adjustment of the mA and/or kV according to patient size and/or use of iterative reconstruction technique. CONTRAST:  OMNIPAQUE  IOHEXOL  300 MG/ML  SOLN COMPARISON:  None Available. FINDINGS: CT CHEST FINDINGS Cardiovascular: Aortic atherosclerosis. Normal heart size. Left coronary artery calcifications. No pericardial effusion. Mediastinum/Nodes: Enlarged pretracheal and right hilar lymph nodes, right hilar nodes measuring up to 2.7 x 2.0 cm (series 2, image 27). The esophagus is filled with fluid and radiopaque ingested material (series 2, image 34). Thyroid and trachea without significant findings. Lungs/Pleura: Mild, predominantly paraseptal emphysema. Spiculated mass of the medial anterior right  apex with a large interface to the adjacent pleura measuring 4.1 x 3.0 cm (series 6, image 32). No pleural effusion or pneumothorax. Musculoskeletal: No chest wall abnormality. Lytic lesion of the proximal left humerus (series 2, image 70). CT ABDOMEN PELVIS FINDINGS Hepatobiliary: No solid liver abnormality is seen. Small gallstone. No gallbladder wall thickening, or biliary dilatation. Pancreas: Unremarkable. No pancreatic ductal dilatation or surrounding inflammatory changes. Spleen: Normal in size without significant abnormality. Adrenals/Urinary Tract: Adrenal glands are unremarkable. Kidneys are normal, without renal calculi, solid lesion, or hydronephrosis. Small diverticulum of the posterior right aspect of the urinary bladder. Stomach/Bowel: Stomach is within normal limits. Appendix appears normal. No evidence of bowel wall thickening, distention, or inflammatory changes. Vascular/Lymphatic: Severe aortic atherosclerosis. No enlarged abdominal or pelvic lymph nodes. Reproductive: No mass or other abnormality. Other: No abdominal wall hernia or abnormality. No ascites. Musculoskeletal: No acute osseous findings. IMPRESSION: 1. Spiculated mass of the medial anterior right pulmonary apex with a large interface to the adjacent pleura measuring 4.1 x 3.0 cm, consistent with primary lung malignancy. 2. Enlarged pretracheal and right hilar lymph nodes, consistent with nodal metastatic disease. 3. Lytic lesion of the proximal left humerus, consistent with osseous metastatic disease. 4. No evidence of lymphadenopathy or metastatic disease in the abdomen or pelvis. 5. Emphysema. 6. Coronary artery disease. 7. Cholelithiasis. These results will be called to the ordering clinician or representative by the Radiologist Assistant, and communication documented in the PACS or [employer]. Aortic Atherosclerosis (ICD10-I70.0) and Emphysema (ICD10-J43.9). Electronically Signed   By: Fredricka Jenny M.D.   On: 03/26/2024  12:41   DG Shoulder Left Result Date: 03/19/2024 CLINICAL DATA:  Pain. EXAM: LEFT SHOULDER - 3 VIEW COMPARISON:  11/08/2023. FINDINGS: No acute fracture, dislocation or subluxation. There is an osteolytic lesion with ill-defined margins consistent with  a neoplastic process involving the proximal humerus. 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 No acute fracture. Electronically Signed   By: Sydell Eva M.D.   On: 03/19/2024 12:09       IMPRESSION/PLAN: 1. Stage IVB (cT2b, N3, M1c) adenocarcinoma, NSCLC, of the RUL with osseous metastatic disease  We have reviewed this patient's current workup.  He presents today with a 4.1 cm right upper lung mass, extensive hypermetabolic adenopathy in the chest and right lower neck, and scattered osseous metastatic disease.  He is currently experiencing 10/10 pain in his left shoulder. Dr. Jeryl Moris recommends palliative radiation to the left shoulder metastasis and RUL mass.    Today, we talked to the patient and family about the findings and work-up thus far.  We discussed the natural history of stage IV lung cancer and general treatment, highlighting the role of radiotherapy in the management.  We discussed the available radiation techniques, and focused on the details of logistics and delivery.  We reviewed the anticipated acute and late sequelae associated with radiation in this setting.  The patient was encouraged to ask questions that I answered to the best of my ability.   Patient is scheduled for CT simulation on Wednesday, 04/18/2024. We will review a consent form at that time. Dr. Jeryl Moris anticipates 30 Gy in 10 fractions to the left shoulder metastasis and RUL mass. We look forward to participating in this patient's care.  He was encouraged to call with any questions or concerns in the meantime.  Patient will meet with Dr. Liam Redhead on 04/16/2024 to discuss systemic treatment options and orthopedic oncology on 04/26/2024 to consider  surgical stabilization for the left shoulder.    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  It is unclear if this is metastatic disease versus benign tissue. His case was discussed at our lung tumor board and the consensus was to proceed with close follow-up imaging.  MRI of the brain ordered to be completed in 6-8 weeks. We will follow-up with the patient after imaging to review results and discuss plan moving forward. We will also share his case at our CNS tumor board.    3. Right parotid nodule  Anterior right parotid soft tissue density nodule measuring 7 mm in greatest dimension was noted to be hypermetabolic on PET imaging. Unclear if this is metastatic disease versus parotid neoplasm. Patient is scheduled for biopsy on 05/03/2024 for further evaluation.    This encounter was conducted via telephone.  The patient has provided two factor identification and has given verbal consent for this type of encounter and has been advised to only accept a meeting of this type in a secure network environment.  The time spent during this encounter was 60 minutes including preparation, discussion, and coordination of the patient's care  The attendants for this meeting include Johna Myers MD, Julio Ohm PA-C, patient, and patient's mother. During the encounter, Johna Myers MD and Julio Ohm PA-C were located at [facility] Radiation Oncology Department.  Patient and family were located at home.   The above documentation reflects my direct findings during this shared patient visit. Please see the separate note by Dr. Jeryl Moris on this date for the remainder of the patient's plan of care.    Julio Ohm, PA-C    **Disclaimer: This note was dictated with voice recognition software. Similar sounding words can inadvertently be transcribed and this note may contain transcription errors which may not have  been corrected upon publication of note.**

## 2024-04-12 NOTE — Progress Notes (Signed)
 Request for tissue to be sent to [facility] for molecular testing emailed to Jerome Moores, [facility] path tech.

## 2024-04-12 NOTE — Progress Notes (Signed)
 Thoracic Location of Tumor / Histology: Right Upper Lobe Lung with multiple mets.    Salivary Gland Parotid Gland Biopsy 05/03/2024  PET 04/09/2024:  Hypermetabolic medial right upper lobe mass extending along the mediastinal margin, compatible with primary bronchogenic carcinoma.  Extensive hypermetabolic pathologic adenopathy in the chest and right lower neck, compatible with metastatic adenopathy.  Scattered lytic osseous metastatic disease including the posterior right clavicle, the proximal left humerus (with pathologic fracture), the left sacrum, and the left L2 vertebral body.  Faintly accentuated metabolic activity in the left adrenal gland with maximum SUV of 4.3 but no well-defined adrenal mass. Right adrenal gland maximum SUV 3.0. Surveillance suggested.  Anterior right parotid soft tissue density nodule measuring about 7 mm in diameter has maximum SUV of 4.9. This is nonspecific and could be a primary parotid neoplasm or a metastatic lymph node. If the patient is an active smoker, Warthin's tumor is likely; otherwise, otolaryngology referral for potential biopsy may be indicated.   Bronchoscopy 04/02/2024:   Biopsies of Right Lung Nodule 04/03/2024    Past/Anticipated interventions by pulmonary, if any:  Dara Ear NP 04/11/2024 -We have reviewed the results of his cytology. They were positive for  non-small cell lung cancer located in the right upper lobe of the lung. 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  -Patient has an appointment with Dr. Liam Redhead 04/16/2024. I have referred him to radiation oncology as I feel treatment will include combination therapy. I have also referred to interventional radiology to have the parotid gland, which is enlarged on the right and palpable, biopsied.     Past/Anticipated interventions by  cardiothoracic surgery, if any:   Past/Anticipated interventions by medical oncology, if any:  Dr. Marguerita Shih 04/16/2024   Tobacco/Marijuana/Snuff/ETOH use: Current Smoker  Signs/Symptoms Weight changes, if any: Fluctuates a few pounds. Respiratory complaints, if any: Denies SOB. Hemoptysis, if any: Denies cough. Pain issues, if any:  Left Shoulder 5/10 at rest, 25/10 pain when in use.  SAFETY ISSUES: Prior radiation? No Pacemaker/ICD? No  Possible current pregnancy? N/a Is the patient on methotrexate? No  Current Complaints / other details:   Heart Stent

## 2024-04-13 ENCOUNTER — Ambulatory Visit
Admission: RE | Admit: 2024-04-13 | Discharge: 2024-04-13 | Disposition: A | Discharge status: Home / Self Care | Source: Ambulatory Visit | Attending: Radiation Oncology | Admitting: Radiation Oncology

## 2024-04-13 ENCOUNTER — Other Ambulatory Visit: Payer: Self-pay

## 2024-04-13 ENCOUNTER — Other Ambulatory Visit: Payer: Self-pay | Admitting: Radiation Therapy

## 2024-04-13 VITALS — Ht 73.0 in | Wt 195.0 lb

## 2024-04-13 DIAGNOSIS — C3490 Malignant neoplasm of unspecified part of unspecified bronchus or lung: Secondary | ICD-10-CM

## 2024-04-13 DIAGNOSIS — C7951 Secondary malignant neoplasm of bone: Secondary | ICD-10-CM

## 2024-04-13 DIAGNOSIS — Z87891 Personal history of nicotine dependence: Secondary | ICD-10-CM | POA: Diagnosis not present

## 2024-04-13 DIAGNOSIS — C3411 Malignant neoplasm of upper lobe, right bronchus or lung: Secondary | ICD-10-CM

## 2024-04-13 NOTE — Addendum Note (Signed)
 Encounter addended by: Pearlene Bouchard, PA-C on: 04/13/2024 3:11 PM  Actions taken: Clinical Note Signed

## 2024-04-14 NOTE — Progress Notes (Signed)
 The proposed treatment discussed in conference is for discussion purpose only and is not a binding recommendation.  The patients have not been physically examined, or presented with their treatment options.  Therefore, final treatment plans cannot be decided.

## 2024-04-16 ENCOUNTER — Other Ambulatory Visit: Payer: Self-pay | Admitting: Medical Oncology

## 2024-04-16 ENCOUNTER — Inpatient Hospital Stay

## 2024-04-16 ENCOUNTER — Inpatient Hospital Stay: Attending: Physician Assistant | Admitting: Internal Medicine

## 2024-04-16 ENCOUNTER — Other Ambulatory Visit: Payer: Self-pay

## 2024-04-16 ENCOUNTER — Inpatient Hospital Stay: Attending: Physician Assistant

## 2024-04-16 ENCOUNTER — Encounter

## 2024-04-16 DIAGNOSIS — Z85828 Personal history of other malignant neoplasm of skin: Secondary | ICD-10-CM | POA: Diagnosis not present

## 2024-04-16 DIAGNOSIS — M1990 Unspecified osteoarthritis, unspecified site: Secondary | ICD-10-CM

## 2024-04-16 DIAGNOSIS — C3490 Malignant neoplasm of unspecified part of unspecified bronchus or lung: Secondary | ICD-10-CM

## 2024-04-16 DIAGNOSIS — C7951 Secondary malignant neoplasm of bone: Secondary | ICD-10-CM

## 2024-04-16 DIAGNOSIS — I519 Heart disease, unspecified: Secondary | ICD-10-CM

## 2024-04-16 DIAGNOSIS — Z87891 Personal history of nicotine dependence: Secondary | ICD-10-CM | POA: Diagnosis not present

## 2024-04-16 DIAGNOSIS — I252 Old myocardial infarction: Secondary | ICD-10-CM | POA: Diagnosis not present

## 2024-04-16 DIAGNOSIS — R918 Other nonspecific abnormal finding of lung field: Secondary | ICD-10-CM

## 2024-04-16 DIAGNOSIS — Z21 Asymptomatic human immunodeficiency virus [HIV] infection status: Secondary | ICD-10-CM

## 2024-04-16 DIAGNOSIS — C3411 Malignant neoplasm of upper lobe, right bronchus or lung: Secondary | ICD-10-CM | POA: Insufficient documentation

## 2024-04-16 DIAGNOSIS — L409 Psoriasis, unspecified: Secondary | ICD-10-CM | POA: Diagnosis not present

## 2024-04-16 DIAGNOSIS — C3491 Malignant neoplasm of unspecified part of right bronchus or lung: Secondary | ICD-10-CM | POA: Insufficient documentation

## 2024-04-16 DIAGNOSIS — C7972 Secondary malignant neoplasm of left adrenal gland: Secondary | ICD-10-CM

## 2024-04-16 DIAGNOSIS — C778 Secondary and unspecified malignant neoplasm of lymph nodes of multiple regions: Secondary | ICD-10-CM

## 2024-04-16 DIAGNOSIS — K219 Gastro-esophageal reflux disease without esophagitis: Secondary | ICD-10-CM

## 2024-04-16 DIAGNOSIS — G4730 Sleep apnea, unspecified: Secondary | ICD-10-CM | POA: Diagnosis not present

## 2024-04-16 LAB — CMP (CANCER CENTER ONLY)
ALT: 9 U/L (ref 0–44)
AST: 13 U/L — ABNORMAL LOW (ref 15–41)
Albumin: 4 g/dL (ref 3.5–5.0)
Alkaline Phosphatase: 79 U/L (ref 38–126)
Anion gap: 6 (ref 5–15)
BUN: 11 mg/dL (ref 6–20)
CO2: 26 mmol/L (ref 22–32)
Calcium: 9.5 mg/dL (ref 8.9–10.3)
Chloride: 109 mmol/L (ref 98–111)
Creatinine: 0.92 mg/dL (ref 0.61–1.24)
GFR, Estimated: >60 mL/min (ref >60)
Glucose, Bld: 84 mg/dL (ref 70–99)
Potassium: 3.7 mmol/L (ref 3.5–5.1)
Sodium: 141 mmol/L (ref 135–145)
Total Bilirubin: 0.4 mg/dL (ref 0.0–1.2)
Total Protein: 7.6 g/dL (ref 6.5–8.1)

## 2024-04-16 LAB — CBC WITH DIFFERENTIAL (CANCER CENTER ONLY)
Abs Immature Granulocytes: 0.01 10*3/uL (ref 0.00–0.07)
Basophils Absolute: 0 10*3/uL (ref 0.0–0.1)
Basophils Relative: 1 %
Eosinophils Absolute: 0.1 10*3/uL (ref 0.0–0.5)
Eosinophils Relative: 3 %
HCT: 40.7 % (ref 39.0–52.0)
Hemoglobin: 13.2 g/dL (ref 13.0–17.0)
Immature Granulocytes: 0 %
Lymphocytes Relative: 44 %
Lymphs Abs: 2.1 10*3/uL (ref 0.7–4.0)
MCH: 25.1 pg — ABNORMAL LOW (ref 26.0–34.0)
MCHC: 32.4 g/dL (ref 30.0–36.0)
MCV: 77.4 fL — ABNORMAL LOW (ref 80.0–100.0)
Monocytes Absolute: 0.4 10*3/uL (ref 0.1–1.0)
Monocytes Relative: 8 %
Neutro Abs: 2.1 10*3/uL (ref 1.7–7.7)
Neutrophils Relative %: 44 %
Platelet Count: 163 10*3/uL (ref 150–400)
RBC: 5.26 MIL/uL (ref 4.22–5.81)
RDW: 15 % (ref 11.5–15.5)
WBC Count: 4.7 10*3/uL (ref 4.0–10.5)
nRBC: 0 % (ref 0.0–0.2)

## 2024-04-16 LAB — MISCELLANEOUS TEST

## 2024-04-16 MED ORDER — FOLIC ACID 1 MG PO TABS: 1.0000 mg | ORAL_TABLET | Freq: Every day | ORAL | 3 refills | Status: DC

## 2024-04-16 MED ORDER — CYANOCOBALAMIN 1000 MCG/ML IJ SOLN: 1000.0000 ug | Freq: Once | INTRAMUSCULAR | Status: DC

## 2024-04-16 MED ORDER — CYANOCOBALAMIN 1000 MCG/ML IJ SOLN
1000.0000 ug | Freq: Once | INTRAMUSCULAR | Status: AC
  Administered 2024-04-16: 1000 ug via INTRAMUSCULAR

## 2024-04-16 MED ORDER — PROCHLORPERAZINE MALEATE 10 MG PO TABS: 10.0000 mg | ORAL_TABLET | Freq: Four times a day (QID) | ORAL | 1 refills | Status: DC | PRN

## 2024-04-16 MED ORDER — ONDANSETRON HCL 8 MG PO TABS: 8.0000 mg | ORAL_TABLET | Freq: Three times a day (TID) | ORAL | 1 refills | Status: DC | PRN

## 2024-04-16 MED ORDER — LIDOCAINE-PRILOCAINE 2.5-2.5 % EX CREA: TOPICAL_CREAM | CUTANEOUS | 3 refills | Status: DC

## 2024-04-16 NOTE — Progress Notes (Signed)
 SABRA

## 2024-04-16 NOTE — Progress Notes (Signed)
 [name] [phone]) [phone]   [phone]  CONSULT NOTE  REFERRING PHYSICIAN: Dr. Lamar Chris  REASON FOR CONSULTATION:  58 years old African-American male recently diagnosed with lung cancer  HPI Benjamin Herrera is a 58 y.o. male.  Discussed the use of AI scribe software for clinical note transcription with the patient, who gave verbal consent to proceed.  History of Present Illness   Benjamin Herrera is a 58 year old male with lung cancer who presents for evaluation and management of his condition. He is accompanied by his sister.  In January, he began experiencing pain in his left shoulder, which led to a visit to his primary care physician. 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 This provided minimal relief, and subsequent treatment with prednisone was ineffective. Further investigation revealed a lytic lesion in the proximal left humerus.  Following the discovery of the lesion, he was hospitalized due to severe pain. A comprehensive scan revealed a mass in the right lung, hilar and mediastinal lymph nodes, and additional spots in the left humerus, sacral area, L1, and left adrenal gland. A bronchoscopy and biopsy confirmed adenocarcinoma of the lung. 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  He experiences significant pain in his left arm, impairing his ability to move and sleep, necessitating the use of a lift chair. He is currently taking Oxycontin  for pain management and requires a refill. Pain increases when standing or when his arm is not supported. No chest pain, coughing, nausea, vomiting, or headaches, but he reports a recent episode of diarrhea. His  weight has fluctuated between 190 and 201 pounds.  He has a history of HIV, managed with stable numbers and no recent modifications to his medication regimen. His past medical history includes heart disease, allergies, anxiety, arthritis, GERD, high blood pressure, psoriasis, sleep apnea, and a previous diagnosis of squamous cell carcinoma of the skin. He has had a heart attack in the past but no strokes.  His family history includes arthritis, high blood pressure, and heart disease in his sister, and cancer on his father's side, including prostate cancer in an uncle and stomach cancer in his grandmother. 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 He has a significant smoking history of about 40 years, having started at age 53, and a past history of cocaine abuse. He occasionally used marijuana but has stopped since his cancer diagnosis. He does not drink alcohol regularly.       Past Medical History:  Diagnosis Date   Achalasia and cardiospasm    Allergy    Anxiety    Arthritis    back ,shoulders    Asthma    Boils    under arms hx of   CAD (coronary artery disease)    a. 09/2016: cath showing 100% RCA occlusion with collaterals and 90% OM1 (treated with DES)   Candidiasis of mouth    Candidiasis of unspecified site    Cholelithiasis    Cocaine abuse (HCC)    Quit 09/2016   Condyloma acuminatum in male of scrotum & anal canal s/p laser ablation 07/03/2012  Coronary artery disease    Dysphagia    Fracture, humerus    Frozen shoulder    left   GERD (gastroesophageal reflux disease)    Heart attack (HCC)    Hemorrhoids    Hepatitis 1993   A   Herpes labialis    HIV (human immunodeficiency virus infection) (HCC) DX 1993   Hypertension Dx 2014   off all bp meds for last 9 or 10 months   Pilonidal disease 01/10/2012   Polysubstance abuse (HCC)    Psoriasis    hx of   Sleep apnea    does  have CPAP   Squamous  cell cancer of skin of intergluteal cleft / pilonidal disease 08/03/2012   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 08/03/2012    Past Surgical History:  Procedure Laterality Date   BALLOON DILATION N/A 11/11/2015   Procedure: BALLOON DILATION;  Surgeon: Gustav Shila GAILS, MD;  Location: [facility];  Service: Endoscopy;  Laterality: N/A;   BOTOX  INJECTION  10/11/2012   Procedure: BOTOX  INJECTION;  Surgeon: Lamar JONETTA Aho, MD;  Location: [facility];  Service: Endoscopy;  Laterality: N/A;   BOTOX  INJECTION N/A 01/25/2013   Procedure: BOTOX  INJECTION;  Surgeon: Lamar JONETTA Aho, MD;  Location: [facility];  Service: Endoscopy;  Laterality: N/A;   BOTOX  INJECTION N/A 06/21/2013   Procedure: BOTOX  INJECTION;  Surgeon: Lamar JONETTA Aho, MD;  Location: [facility];  Service: Endoscopy;  Laterality: N/A;   BOTOX  INJECTION N/A 10/08/2013   Procedure: BOTOX  INJECTION;  Surgeon: Lamar JONETTA Aho, MD;  Location: [facility];  Service: Endoscopy;  Laterality: N/A;   BOTOX  INJECTION N/A 06/16/2015   Procedure: BOTOX  INJECTION;  Surgeon: Lamar JONETTA Aho, MD;  Location: [facility];  Service: Endoscopy;  Laterality: N/A;   BRONCHIAL BIOPSY  04/03/2024   Procedure: BRONCHOSCOPY, WITH BIOPSY;  Surgeon: Shelah Lamar RAMAN, MD;  Location: [facility];  Service: Pulmonary;;   BRONCHIAL BRUSHINGS  04/03/2024   Procedure: BRONCHOSCOPY, WITH BRUSH BIOPSY;  Surgeon: Shelah Lamar RAMAN, MD;  Location: [facility];  Service: Pulmonary;;   BRONCHIAL NEEDLE ASPIRATION BIOPSY  04/03/2024   Procedure: BRONCHOSCOPY, WITH NEEDLE ASPIRATION BIOPSY;  Surgeon: Shelah Lamar RAMAN, MD;  Location: [facility];  Service: Pulmonary;;   BRONCHOSCOPY, WITH BIOPSY USING ELECTROMAGNETIC NAVIGATION Right 04/03/2024   Procedure: BRONCHOSCOPY, WITH BIOPSY USING ELECTROMAGNETIC NAVIGATION;  Surgeon: Shelah Lamar RAMAN, MD;  Location: [facility];  Service: Pulmonary;  Laterality: Right;   CARDIAC CATHETERIZATION N/A  10/11/2016   Procedure: Left Heart Cath and Coronary Angiography;  Surgeon: Lonni JONETTA Cash, MD;  mRCA 100% w/ L>R collaterals, OM1 90%   CARDIAC CATHETERIZATION N/A 10/11/2016   Procedure: Coronary Stent Intervention;  Surgeon: Lonni JONETTA Cash, MD;  RESOLUTE ONYX 3.5X15 DES OM1   COLONOSCOPY  05/19/2012   Procedure: COLONOSCOPY;  Surgeon: Lamar JONETTA Aho, MD;  Location: [facility];  Service: Endoscopy;  Laterality: N/A;  jill trying to contact pt to come in 0830 for 930 case, phone not accepting messages   COLONOSCOPY     ESOPHAGEAL MANOMETRY  05/29/2012   Procedure: ESOPHAGEAL MANOMETRY (EM);  Surgeon: Lamar JONETTA Aho, MD;  Location: [facility];  Service: Endoscopy;  Laterality: N/A;   ESOPHAGEAL MANOMETRY N/A 10/06/2015   Procedure: ESOPHAGEAL MANOMETRY (EM);  Surgeon: Gustav Shila GAILS, MD;  Location: [facility];  Service: Endoscopy;  Laterality: N/A;   ESOPHAGOGASTRODUODENOSCOPY  11/11/2011   Procedure: ESOPHAGOGASTRODUODENOSCOPY (EGD);  Surgeon: Lamar JONETTA Aho, MD;  Location: [facility];  Service: Endoscopy;  Laterality: N/A;  botox  injection  called Pt to change time of procedure per Dr Debrah   ESOPHAGOGASTRODUODENOSCOPY  03/10/2012   Procedure: ESOPHAGOGASTRODUODENOSCOPY (EGD);  Surgeon: Lamar JONETTA Debrah, MD;  Location: [facility];  Service: Endoscopy;  Laterality: N/A;   ESOPHAGOGASTRODUODENOSCOPY  10/11/2012   Procedure: ESOPHAGOGASTRODUODENOSCOPY (EGD);  Surgeon: Lamar JONETTA Debrah, MD;  Location: [facility];  Service: Endoscopy;  Laterality: N/A;   ESOPHAGOGASTRODUODENOSCOPY N/A 01/25/2013   Procedure: ESOPHAGOGASTRODUODENOSCOPY (EGD);  Surgeon: Lamar JONETTA Debrah, MD;  Location: [facility];  Service: Endoscopy;  Laterality: N/A;   ESOPHAGOGASTRODUODENOSCOPY N/A 06/21/2013   Procedure: ESOPHAGOGASTRODUODENOSCOPY (EGD);  Surgeon: Lamar JONETTA Debrah, MD;  Location: [facility];  Service: Endoscopy;  Laterality: N/A;   ESOPHAGOGASTRODUODENOSCOPY (EGD) WITH PROPOFOL  N/A  10/08/2013   Procedure: ESOPHAGOGASTRODUODENOSCOPY (EGD) WITH PROPOFOL ;  Surgeon: Lamar JONETTA Debrah, MD;  Location: [facility];  Service: Endoscopy;  Laterality: N/A;   ESOPHAGOGASTRODUODENOSCOPY (EGD) WITH PROPOFOL  N/A 06/16/2015   Procedure: ESOPHAGOGASTRODUODENOSCOPY (EGD) WITH PROPOFOL ;  Surgeon: Lamar JONETTA Debrah, MD;  Location: [facility];  Service: Endoscopy;  Laterality: N/A;   ESOPHAGOGASTRODUODENOSCOPY (EGD) WITH PROPOFOL  N/A 11/11/2015   Procedure: ESOPHAGOGASTRODUODENOSCOPY (EGD) WITH PROPOFOL ;  Surgeon: Gustav Shila GAILS, MD;  Location: [facility];  Service: Endoscopy;  Laterality: N/A;   EXAMINATION UNDER ANESTHESIA  07/24/2012   Procedure: EXAM UNDER ANESTHESIA;  Surgeon: Elspeth KYM Schultze, MD;  Location: [facility];  Service: General;  Laterality: N/A;   humeral fracture surgery Right yrs ago   PILONIDAL CYST EXCISION  07/24/2012   Procedure: CYST EXCISION PILONIDAL SIMPLE;  Surgeon: Elspeth KYM Schultze, MD;  Location: [facility];  Service: General;  Laterality: N/A;  Exam Under Anesthesia,, Excision Pilonidal Disease,    right shoulder replacement  2008   x 2   UPPER GASTROINTESTINAL ENDOSCOPY     WART FULGURATION  07/24/2012   Procedure: FULGURATION ANAL WART;  Surgeon: Elspeth KYM Schultze, MD;  Location: [facility];  Service: General;  Laterality: N/A;  excision of raphe mass    Family History  Problem Relation Age of Onset   Arthritis Mother    Hypertension Mother    Heart disease Mother        MI age 64   Heart attack Sister    Diabetes Maternal Grandmother    Stomach cancer Paternal Grandmother    Colon cancer Neg Hx    Colon polyps Neg Hx    Esophageal cancer Neg Hx    Rectal cancer Neg Hx     Social History Social History   Tobacco Use   Smoking status: Former    Current packs/day: 1.00    Average packs/day: 1 pack/day for 42.5 years (42.5 ttl pk-yrs)    Types: Cigarettes    Start date: 1983   Smokeless tobacco: Never   Tobacco comments:    4 or 5 cigarettes a day      States quit one week ago 04/02/2024  Vaping Use   Vaping status: Never Used  Substance Use Topics   Alcohol use: No    Alcohol/week: 0.0 standard drinks of alcohol   Drug use: Yes    Frequency: 3.0 times per week    Types: Marijuana    Allergies  Allergen Reactions   Citrus Swelling    Mouth itching and swelling     Current Outpatient Medications  Medication Sig Dispense Refill   acetaminophen  (TYLENOL ) 325 MG tablet Take 650 mg by mouth every 6 (six) hours as needed for mild pain (pain score 1-3),  moderate pain (pain score 4-6) or headache.     albuterol  (VENTOLIN  HFA) 108 (90 Base) MCG/ACT inhaler Inhale 2 puffs into the lungs every 6 (six) hours as needed for wheezing or shortness of breath. 1 each 2   amLODipine  (NORVASC ) 5 MG tablet Take 1 tablet (5 mg total) by mouth daily. 90 tablet 3   aspirin  81 MG chewable tablet Chew 1 tablet (81 mg total) by mouth daily. 90 tablet 3   BIKTARVY  30-120-15 MG TABS TAKE 1 TABLET BY MOUTH EVERY DAY 30 tablet 11   Blood Pressure KIT 1 kit by Does not apply route daily. 1 kit 0   diclofenac  Sodium (VOLTAREN  ARTHRITIS PAIN) 1 % GEL Apply 2 g topically 4 (four) times daily. 100 g 3   meloxicam  (MOBIC ) 15 MG tablet Take 1 tablet (15 mg total) by mouth daily. (Patient not taking: Reported on 04/13/2024) 30 tablet 1   methocarbamol  (ROBAXIN ) 500 MG tablet Take 2 tablets (1,000 mg total) by mouth every 8 (eight) hours as needed. 90 tablet 1   metoprolol  succinate (TOPROL -XL) 25 MG 24 hr tablet TAKE 1/2 TABLET BY MOUTH DAILY 45 tablet 0   nitroGLYCERIN  (NITROSTAT ) 0.4 MG SL tablet Place 1 tablet (0.4 mg total) under the tongue every 5 (five) minutes as needed for chest pain (trouble swallowing). 25 tablet 1   No current facility-administered medications for this visit.    Review of Systems  Constitutional: positive for fatigue and weight loss Eyes: negative Ears, nose, mouth, throat, and face: negative Respiratory: negative Cardiovascular:  negative Gastrointestinal: negative Genitourinary:negative Integument/breast: negative Hematologic/lymphatic: negative Musculoskeletal:positive for bone pain Neurological: negative Behavioral/Psych: negative Endocrine: negative Allergic/Immunologic: negative  Physical Exam  MJO:jozmu, healthy, no distress, well nourished, well developed, and anxious SKIN: skin color, texture, turgor are normal, no rashes or significant lesions HEAD: Normocephalic, No masses, lesions, tenderness or abnormalities EYES: normal, PERRLA, Conjunctiva are pink and non-injected EARS: External ears normal, Canals clear OROPHARYNX:no exudate, no erythema, and lips, buccal mucosa, and tongue normal  NECK: supple, no adenopathy, no JVD LYMPH:  no palpable lymphadenopathy, no hepatosplenomegaly LUNGS: clear to auscultation , and palpation HEART: regular rate & rhythm, no murmurs, and no gallops ABDOMEN:abdomen soft, non-tender, normal bowel sounds, and no masses or organomegaly BACK: Back symmetric, no curvature., No CVA tenderness EXTREMITIES:no joint deformities, effusion, or inflammation, no edema  NEURO: alert & oriented x 3 with fluent speech, no focal motor/sensory deficits  PERFORMANCE STATUS: ECOG 1  LABORATORY DATA: Lab Results  Component Value Date   WBC 4.7 04/16/2024   HGB 13.2 04/16/2024   HCT 40.7 04/16/2024   MCV 77.4 (L) 04/16/2024   PLT 163 04/16/2024      Chemistry      Component Value Date/Time   NA 141 04/16/2024 1343   NA 139 01/05/2024 1421   K 3.7 04/16/2024 1343   CL 109 04/16/2024 1343   CO2 26 04/16/2024 1343   BUN 11 04/16/2024 1343   BUN 16 01/05/2024 1421   CREATININE 0.92 04/16/2024 1343   CREATININE 0.96 07/30/2020 1437      Component Value Date/Time   CALCIUM  9.5 04/16/2024 1343   ALKPHOS 79 04/16/2024 1343   AST 13 (L) 04/16/2024 1343   ALT 9 04/16/2024 1343   BILITOT 0.4 04/16/2024 1343       RADIOGRAPHIC STUDIES: NM PET Image Initial (PI) Skull  Base To Thigh Result Date: 04/09/2024 CLINICAL DATA:  Initial treatment strategy for lung cancer. EXAM: NUCLEAR MEDICINE PET SKULL BASE  TO THIGH TECHNIQUE: 9.5 mCi F-18 FDG was injected intravenously. Full-ring PET imaging was performed from the skull base to thigh after the radiotracer. CT data was obtained and used for attenuation correction and anatomic localization. Fasting blood glucose: 90 mg/dl COMPARISON:  93/97/7974 FINDINGS: Mediastinal blood pool activity: SUV max 2.1 Liver activity: SUV max NA NECK: Hypermetabolic right level IV lymph node adjacent to hypermetabolic supraclavicular nodes. The right level IV node measures 0.6 cm in short axis on image 45 series 4 with maximum SUV 8.6. Anterior right parotid soft tissue density nodule measuring about 7 mm in diameter has maximum SUV of 4.9. Incidental CT findings: Unremarkable CHEST: Hypermetabolic right supraclavicular, right paratracheal, right hilar, left paratracheal, paraesophageal, subcarinal, bilateral axillary, and left subpectoral pathologic adenopathy along with some small subcutaneous lymph nodes along the left lateral chest. Index right hilar node 2.3 cm in short axis with maximum SUV 23.0. Index subcarinal node 1.5 cm in short axis on image 77 series 4 with maximum SUV 20.7. The medial right upper lobe mass extending along the mediastinal margin measures of bowel 3.8 by 2.7 cm with maximum SUV 19.7. Mild nodularity believed to probably be in the right upper lobe along the margin of the major fissure measuring 0.9 by 0.5 cm on image 34 series 7 has maximum SUV of 2.1; exact relationship of this nodularity with the minor fissure is difficult to discern. Incidental CT findings: Atheromatous vascular calcification of the aortic arch and coronary arteries. Emphysema. Dilated esophagus. ABDOMEN/PELVIS: Faintly accentuated metabolic activity in the left adrenal gland with maximum SUV of 4.3 but no well-defined adrenal mass. Right adrenal gland  maximum SUV 3.0. Small bilateral inguinal lymph nodes have low-grade activity. Incidental CT findings: Atherosclerosis is present, including aortoiliac atherosclerotic disease. Cholelithiasis. SKELETON: Scattered lytic osseous metastatic disease including the posterior right clavicle, the proximal left humerus (with pathologic fracture) the left sacrum, and the left L2 vertebral body. Index proximal left humeral lesion with maximum SUV 20.6. Index lesion in the left L2 vertebral body measures 2.6 cm in long axis with maximum SUV 20.2. Incidental CT findings: None. IMPRESSION: 1. Hypermetabolic medial right upper lobe mass extending along the mediastinal margin, compatible with primary bronchogenic carcinoma. 2. Extensive hypermetabolic pathologic adenopathy in the chest and right lower neck, compatible with metastatic adenopathy. 3. Scattered lytic osseous metastatic disease including the posterior right clavicle, the proximal left humerus (with pathologic fracture), the left sacrum, and the left L2 vertebral body. 4. Faintly accentuated metabolic activity in the left adrenal gland with maximum SUV of 4.3 but no well-defined adrenal mass. Right adrenal gland maximum SUV 3.0. Surveillance suggested. 5. Anterior right parotid soft tissue density nodule measuring about 7 mm in diameter has maximum SUV of 4.9. This is nonspecific and could be a primary parotid neoplasm or a metastatic lymph node. If the patient is an active smoker, Warthin's tumor is likely; otherwise, otolaryngology referral for potential biopsy may be indicated. 6. Cholelithiasis. 7. Aortic Atherosclerosis (ICD10-I70.0) and Emphysema (ICD10-J43.9). Electronically Signed   By: Ryan Salvage M.D.   On: 04/09/2024 14:12   MR Brain W Wo Contrast Result Date: 04/06/2024 CLINICAL DATA:  New lung cancer diagnosis, staging workup. EXAM: MRI HEAD WITHOUT AND WITH CONTRAST TECHNIQUE: Multiplanar, multiecho pulse sequences of the brain and surrounding  structures were obtained without and with intravenous contrast. CONTRAST:  9mL GADAVIST  GADOBUTROL  1 MMOL/ML IV SOLN COMPARISON:  None Available. FINDINGS: Brain: No acute infarct. No evidence of intracranial hemorrhage. Nonspecific scattered punctate foci of T2/FLAIR  hyperintensity in the periventricular and subcortical white matter. There is a 1.2 x 1.0 x 0.9 cm enhancing mass in the region of the pineal gland abutting the tectum of the midbrain. The superior and slightly posterior aspect of the mass abuts the internal cerebral veins. There are areas of susceptibility within the mass suggestive of calcification which appear to be primarily within the peripheral aspect of the lesion. No edema, mass effect, or midline shift. The basilar cisterns are patent. No extra-axial fluid collections. Ventricles: Normal size and configuration of the ventricles. Vascular: Skull base flow voids are visualized. Skull and upper cervical spine: Degenerative changes in the visualized upper cervical spine. Visualized calvarium is unremarkable. Sinuses/Orbits: Orbits are symmetric. Mild mucosal thickening in the right maxillary sinus. Other: Mastoid air cells are clear. There is a 0 0.7 x 0.7 x 1.1 cm T2 hyperintense lesion within the superficial lobe of the left parotid gland. Additional 1.8 x 1.8 cm nonenhancing lesion in the right parotid tail is partially visualized (series 1100, image 58). 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 Differential considerations include metastatic disease, pineocytoma, or other pineal parenchymal tumor. No abnormal enhancement within the brain parenchyma. 1.1 cm nonenhancing lesion within the superficial lobe of the left parotid gland. Additional 1.8 cm nonenhancing lesion in the right parotid tail which is partially visualized. Findings could reflect parotid neoplasms. Recommend correlation  with tissue sampling. Mild chronic microvascular ischemic changes. Electronically Signed   By: Donnice Mania M.D.   On: 04/06/2024 15:47   DG Chest Port 1 View Result Date: 04/03/2024 CLINICAL DATA:  Status post bronchoscopy EXAM: PORTABLE CHEST 1 VIEW COMPARISON:  October 29, 2021 FINDINGS: Mild bilateral interstitial prominence of the bases of the lungs similar to prior examination without other infiltrates consolidations or pulmonary edema. No pneumothorax IMPRESSION: Mild bilateral interstitial prominence of the bases of the lungs similar to prior examination without other infiltrates consolidations or pulmonary edema. Post bronchoscopy without evidence of pneumothorax Electronically Signed   By: Franky Chard M.D.   On: 04/03/2024 15:22   DG C-ARM BRONCHOSCOPY Result Date: 04/03/2024 C-ARM BRONCHOSCOPY: Fluoroscopy was utilized by the requesting physician.  No radiographic interpretation.   MR SHOULDER LEFT W WO CONTRAST Result Date: 03/30/2024 MR SHOULDER WITHOUT THEN WITH IV CONTRAST LEFT COMPARISON: X-ray 03/19/2024 CLINICAL HISTORY: History of cancer with left shoulder pain. PULSE SEQUENCES: Ax PD FS, Sag T2 FS, Cor T1 & COR T2 FS with and without 9 mL of IV contrast. FINDINGS: There is a large destructive mass replacing the proximal humeral metaphysis and diaphysis measuring approximately 4.4 x 4.9 x 6.6 cm in size. There is significant edema and destruction of the cortex and medullary bone. Findings are consistent with a large metastatic lesion in patient with history of lung mass. There is likely a pathologic fracture due to the degree of destruction of bone. Otherwise, limited evaluation of the bony structures are unremarkable. Rotator cuff, biceps tendon and labrum are grossly intact. IMPRESSION: Large mass replacing the proximal humeral metaphysis diaphysis with destruction of the cortex. There is a soft tissue component surrounding the proximal humerus. Finding is consistent with metastatic  disease in patient with history of lung mass. There is either a pathologic fracture or pending pathologic fracture present. Electronically signed by: Norleen Satchel MD 03/30/2024 09:49 AM EDT RP Workstation: [id number]   CT CHEST ABDOMEN PELVIS W CONTRAST Result Date: 03/26/2024 CLINICAL DATA:  Malignancy evaluation, lytic  lesion of the left humerus identified by radiographs * Tracking Code: BO * EXAM: CT CHEST, ABDOMEN, AND PELVIS WITH CONTRAST TECHNIQUE: Multidetector CT imaging of the chest, abdomen and pelvis was performed following the standard protocol during bolus administration of intravenous contrast. RADIATION DOSE REDUCTION: This exam was performed according to the departmental dose-optimization program which includes automated exposure control, adjustment of the mA and/or kV according to patient size and/or use of iterative reconstruction technique. CONTRAST:  OMNIPAQUE  IOHEXOL  300 MG/ML  SOLN COMPARISON:  None Available. FINDINGS: CT CHEST FINDINGS Cardiovascular: Aortic atherosclerosis. Normal heart size. Left coronary artery calcifications. No pericardial effusion. Mediastinum/Nodes: Enlarged pretracheal and right hilar lymph nodes, right hilar nodes measuring up to 2.7 x 2.0 cm (series 2, image 27). The esophagus is filled with fluid and radiopaque ingested material (series 2, image 34). Thyroid and trachea without significant findings. Lungs/Pleura: Mild, predominantly paraseptal emphysema. Spiculated mass of the medial anterior right apex with a large interface to the adjacent pleura measuring 4.1 x 3.0 cm (series 6, image 32). No pleural effusion or pneumothorax. Musculoskeletal: No chest wall abnormality. Lytic lesion of the proximal left humerus (series 2, image 70). CT ABDOMEN PELVIS FINDINGS Hepatobiliary: No solid liver abnormality is seen. Small gallstone. No gallbladder wall thickening, or biliary dilatation. Pancreas: Unremarkable. No pancreatic ductal dilatation or surrounding  inflammatory changes. Spleen: Normal in size without significant abnormality. Adrenals/Urinary Tract: Adrenal glands are unremarkable. Kidneys are normal, without renal calculi, solid lesion, or hydronephrosis. Small diverticulum of the posterior right aspect of the urinary bladder. Stomach/Bowel: Stomach is within normal limits. Appendix appears normal. No evidence of bowel wall thickening, distention, or inflammatory changes. Vascular/Lymphatic: Severe aortic atherosclerosis. No enlarged abdominal or pelvic lymph nodes. Reproductive: No mass or other abnormality. Other: No abdominal wall hernia or abnormality. No ascites. Musculoskeletal: No acute osseous findings. IMPRESSION: 1. Spiculated mass of the medial anterior right pulmonary apex with a large interface to the adjacent pleura measuring 4.1 x 3.0 cm, consistent with primary lung malignancy. 2. Enlarged pretracheal and right hilar lymph nodes, consistent with nodal metastatic disease. 3. Lytic lesion of the proximal left humerus, consistent with osseous metastatic disease. 4. No evidence of lymphadenopathy or metastatic disease in the abdomen or pelvis. 5. Emphysema. 6. Coronary artery disease. 7. Cholelithiasis. These results will be called to the ordering clinician or representative by the Radiologist Assistant, and communication documented in the PACS or [employer]. Aortic Atherosclerosis (ICD10-I70.0) and Emphysema (ICD10-J43.9). Electronically Signed   By: Marolyn JONETTA Jaksch M.D.   On: 03/26/2024 12:41   DG Shoulder Left Result Date: 03/19/2024 CLINICAL DATA:  Pain. EXAM: LEFT SHOULDER - 3 VIEW COMPARISON:  11/08/2023. FINDINGS: No acute fracture, dislocation or subluxation. There is an osteolytic lesion with ill-defined margins consistent with a neoplastic process involving the proximal humerus. 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 No acute fracture. Electronically Signed   By: Fonda Field M.D.   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 Molecular studies are still pending and he has negative PD-L1 expression.   PLAN: I had a lengthy discussion with the patient  and his sister today about his current disease stage, prognosis and treatment options.  I personally independently reviewed the scan images and discussed the result with the patient and his sister. He understand that the goal of care is palliative in nature with no cure for his condition.  The treatment will help prolong his survival and manage his symptoms but not curative in nature. Assessment and Plan    Stage 4 non-small cell lung cancer Stage 4 non-small cell lung cancer, adenocarcinoma subtype, with metastasis to the right lung, hilar and mediastinal lymph nodes, right neck lymph node, left humerus, sacral area, L1, left adrenal gland, and possibly the brain. The condition is terminal but treatment can extend life and improve quality of life. Options include targeted therapy if molecular markers are present, or chemotherapy with immunotherapy if not. Radiation is planned for the left humerus to alleviate pain. He experiences significant pain and functional impairment in the left arm due to bone metastasis. Informed consent was obtained, discussing that without treatment, life expectancy is 3-6 months, while treatment could extend life to an average of 2 years. Risks of chemotherapy include immunosuppression, nausea, vomiting, alopecia, and potential renal and hepatic effects. Alternatives include hospice care. - Order molecular  marker testing from blood and tissue samples - Schedule radiation therapy for left humerus - Refer to palliative care for pain management - Prescribe Percocet for pain management - Schedule chemotherapy with carboplatin, Alimta, and Libtayo if no targetable mutation is found - Administer B12 injection and prescribe folic acid for chemotherapy preparation - Arrange dental clearance for Xgeva administration - Administer Xgeva for bone metastasis after dental clearance - Provide antiemetic medication and arrange chemotherapy education  HIV infection HIV infection is well-managed with medication under the care of Dr. Eben. No recent viral load or CD4 count available, but he reports adherence to treatment.  Heart disease History of myocardial infarction. No current symptoms or acute issues discussed.  Hypertension Hypertension. No current symptoms or acute issues discussed.  Psoriasis Psoriasis. No current symptoms or acute issues discussed.  Sleep apnea Sleep apnea. No current symptoms or acute issues discussed.  GERD GERD. No current symptoms or acute issues discussed.  Anxiety Anxiety. No current symptoms or acute issues discussed.  Arthritis Arthritis. No current symptoms or acute issues discussed.  History of squamous cell carcinoma of the skin History of squamous cell carcinoma of the skin. No current symptoms or acute issues discussed.  Goals of Care He expressed a desire to pursue treatment to extend life and improve quality of life, despite the terminal nature of the condition. He is not ready to die and prefers treatment over hospice care.  Follow-up Follow-up plans for ongoing management and treatment of lung cancer. - Schedule follow-up appointment in two weeks for potential chemotherapy initiation - Ensure dental appointment for clearance before Xgeva administration - Monitor molecular marker test results to determine treatment plan   The patient was advised to  call immediately if he has any concerning symptoms in the interval.    The patient voices understanding of current disease status and treatment options and is in agreement with the current care plan.  All questions were answered. The patient knows to call the clinic with any problems, questions or concerns. We can certainly see the patient much sooner if necessary.  Thank you so much for allowing me to participate in the care of Benjamin Herrera. I will continue to follow up the patient with you and assist in his care.  The total time spent in the appointment was 90 minutes including review of chart and various tests results, discussions about plan of care and coordination of care plan .  Disclaimer: This note was dictated with voice recognition software. Similar sounding words can inadvertently be transcribed and may not be corrected upon review.   Benjamin Herrera Benjamin April 16, 2024, 2:25 PM

## 2024-04-16 NOTE — Progress Notes (Signed)
 I met the pt today at his medical oncology consult with Dr. Sherrod. Pt was accompanied by his friend, Dorothe. Pt recently diagnosed with stage IV metastatic adenocarcinoma. Pt was understandably upset by this news. I provided emotional support and the lung cancer journey book. Pt's tissue is currently being tested for biomarkers at [facility]. The plan for the pt is to wait for the results of the biomarker testing before treatment with chemoimmunotherapy with carboplatin, alimta, and libtayo. Pt will receive a B12 injection today in anticipation of starting chemotherapy.  We will also be sending the pt back to the lab for a [facility] CDx blood test.  Pt will also be receiving palliative radiation to his left shoulder and R lung while the results of his biomarker tests are pending.  I provided my card to the pt and encouraged him to call me with any questions or concerns.

## 2024-04-16 NOTE — Progress Notes (Signed)
 START OFF PATHWAY REGIMEN - Non-Small Cell Lung   [id number]:Cemiplimab 350 mg IV D1 + Carboplatin AUC=5 IV D1 + Pemetrexed 500 mg/m2 IV D1 q21 Days x 4 Cycles:   A cycle is every 21 days:     Pemetrexed      Carboplatin      Cemiplimab-rwlc   **Always confirm dose/schedule in your pharmacy ordering system**  Patient Characteristics: Stage IV Metastatic, Nonsquamous, Awaiting Molecular Test Results and Need to Start Chemotherapy, PS = 0, 1 Therapeutic Status: Stage IV Metastatic Histology: Nonsquamous Cell Broad Molecular Profiling Status: Awaiting Molecular Test Results and Need to Start Chemotherapy ECOG Performance Status: 1 Intent of Therapy: Non-Curative / Palliative Intent, Discussed with Patient

## 2024-04-17 ENCOUNTER — Telehealth: Payer: Self-pay

## 2024-04-17 ENCOUNTER — Other Ambulatory Visit: Payer: Self-pay | Admitting: Internal Medicine

## 2024-04-17 ENCOUNTER — Other Ambulatory Visit: Payer: Self-pay

## 2024-04-17 MED ORDER — OXYCODONE-ACETAMINOPHEN 5-325 MG PO TABS: 1.0000 | ORAL_TABLET | Freq: Three times a day (TID) | ORAL | 0 refills | Status: DC | PRN

## 2024-04-17 NOTE — Telephone Encounter (Signed)
 Spoke with patient this morning regarding pain medication refill. Patient stated he had an appointment with Dr. Sherrod yesterday and requested a refill of Percocet. Informed patient that a referral to palliative care has been made for pain management, but that I will relay his request to Dr. Sherrod for review. Patient voiced understanding.

## 2024-04-18 ENCOUNTER — Ambulatory Visit
Admission: RE | Admit: 2024-04-18 | Discharge: 2024-04-18 | Disposition: A | Discharge status: Home / Self Care | Source: Ambulatory Visit | Attending: Radiation Oncology | Admitting: Radiation Oncology

## 2024-04-18 ENCOUNTER — Other Ambulatory Visit: Payer: Self-pay | Admitting: Radiology

## 2024-04-18 ENCOUNTER — Encounter: Payer: Self-pay | Admitting: Internal Medicine

## 2024-04-18 DIAGNOSIS — Z87891 Personal history of nicotine dependence: Secondary | ICD-10-CM | POA: Diagnosis not present

## 2024-04-18 DIAGNOSIS — C3491 Malignant neoplasm of unspecified part of right bronchus or lung: Secondary | ICD-10-CM | POA: Diagnosis not present

## 2024-04-18 DIAGNOSIS — C7951 Secondary malignant neoplasm of bone: Secondary | ICD-10-CM

## 2024-04-18 DIAGNOSIS — C3411 Malignant neoplasm of upper lobe, right bronchus or lung: Secondary | ICD-10-CM | POA: Diagnosis not present

## 2024-04-18 DIAGNOSIS — Z510 Encounter for antineoplastic radiation therapy: Secondary | ICD-10-CM | POA: Diagnosis not present

## 2024-04-19 ENCOUNTER — Ambulatory Visit
Admission: RE | Admit: 2024-04-19 | Discharge: 2024-04-19 | Disposition: A | Discharge status: Home / Self Care | Source: Ambulatory Visit | Attending: Radiation Oncology | Admitting: Radiation Oncology

## 2024-04-19 ENCOUNTER — Other Ambulatory Visit: Payer: Self-pay | Admitting: Radiation Therapy

## 2024-04-19 ENCOUNTER — Telehealth: Payer: Self-pay | Admitting: Internal Medicine

## 2024-04-19 ENCOUNTER — Other Ambulatory Visit: Payer: Self-pay

## 2024-04-19 DIAGNOSIS — C3411 Malignant neoplasm of upper lobe, right bronchus or lung: Secondary | ICD-10-CM | POA: Diagnosis not present

## 2024-04-19 DIAGNOSIS — Z87891 Personal history of nicotine dependence: Secondary | ICD-10-CM | POA: Diagnosis not present

## 2024-04-19 DIAGNOSIS — Z510 Encounter for antineoplastic radiation therapy: Secondary | ICD-10-CM | POA: Diagnosis not present

## 2024-04-19 DIAGNOSIS — C7951 Secondary malignant neoplasm of bone: Secondary | ICD-10-CM | POA: Diagnosis not present

## 2024-04-19 LAB — RAD ONC ARIA SESSION SUMMARY

## 2024-04-19 NOTE — Telephone Encounter (Signed)
 Left a voicemail with the patient education and first appointment details.

## 2024-04-20 ENCOUNTER — Other Ambulatory Visit: Payer: Self-pay

## 2024-04-20 ENCOUNTER — Encounter: Payer: Self-pay | Admitting: Internal Medicine

## 2024-04-20 ENCOUNTER — Ambulatory Visit
Admission: RE | Admit: 2024-04-20 | Discharge: 2024-04-20 | Disposition: A | Discharge status: Home / Self Care | Source: Ambulatory Visit | Attending: Radiation Oncology

## 2024-04-20 ENCOUNTER — Ambulatory Visit
Admission: RE | Admit: 2024-04-20 | Discharge: 2024-04-20 | Disposition: A | Discharge status: Home / Self Care | Source: Ambulatory Visit | Attending: Radiation Oncology | Admitting: Radiation Oncology

## 2024-04-20 DIAGNOSIS — Z87891 Personal history of nicotine dependence: Secondary | ICD-10-CM | POA: Diagnosis not present

## 2024-04-20 DIAGNOSIS — C3411 Malignant neoplasm of upper lobe, right bronchus or lung: Secondary | ICD-10-CM | POA: Diagnosis not present

## 2024-04-20 DIAGNOSIS — C7951 Secondary malignant neoplasm of bone: Secondary | ICD-10-CM | POA: Diagnosis not present

## 2024-04-20 DIAGNOSIS — Z510 Encounter for antineoplastic radiation therapy: Secondary | ICD-10-CM | POA: Diagnosis not present

## 2024-04-20 LAB — RAD ONC ARIA SESSION SUMMARY

## 2024-04-23 ENCOUNTER — Ambulatory Visit
Admission: RE | Admit: 2024-04-23 | Discharge: 2024-04-23 | Disposition: A | Discharge status: Home / Self Care | Source: Ambulatory Visit | Attending: Radiation Oncology

## 2024-04-23 ENCOUNTER — Other Ambulatory Visit: Payer: Self-pay

## 2024-04-23 DIAGNOSIS — C7951 Secondary malignant neoplasm of bone: Secondary | ICD-10-CM | POA: Diagnosis not present

## 2024-04-23 DIAGNOSIS — Z510 Encounter for antineoplastic radiation therapy: Secondary | ICD-10-CM | POA: Diagnosis not present

## 2024-04-23 DIAGNOSIS — C3411 Malignant neoplasm of upper lobe, right bronchus or lung: Secondary | ICD-10-CM | POA: Diagnosis not present

## 2024-04-23 DIAGNOSIS — Z87891 Personal history of nicotine dependence: Secondary | ICD-10-CM | POA: Diagnosis not present

## 2024-04-23 LAB — RAD ONC ARIA SESSION SUMMARY
Course Elapsed Days: 4
Plan Fractions Treated to Date: 3
Plan Fractions Treated to Date: 3
Plan Prescribed Dose Per Fraction: 3 Gy
Plan Prescribed Dose Per Fraction: 3 Gy
Plan Total Fractions Prescribed: 10
Plan Total Fractions Prescribed: 10
Plan Total Prescribed Dose: 30 Gy
Plan Total Prescribed Dose: 30 Gy
Reference Point Dosage Given to Date: 9 Gy
Reference Point Dosage Given to Date: 9 Gy
Reference Point Session Dosage Given: 3 Gy
Reference Point Session Dosage Given: 3 Gy
Session Number: 3

## 2024-04-24 ENCOUNTER — Other Ambulatory Visit: Payer: Self-pay

## 2024-04-24 ENCOUNTER — Ambulatory Visit
Admission: RE | Admit: 2024-04-24 | Discharge: 2024-04-24 | Disposition: A | Discharge status: Home / Self Care | Source: Ambulatory Visit | Attending: Radiation Oncology | Admitting: Radiation Oncology

## 2024-04-24 DIAGNOSIS — C3411 Malignant neoplasm of upper lobe, right bronchus or lung: Secondary | ICD-10-CM | POA: Diagnosis not present

## 2024-04-24 DIAGNOSIS — Z87891 Personal history of nicotine dependence: Secondary | ICD-10-CM | POA: Diagnosis not present

## 2024-04-24 DIAGNOSIS — C7951 Secondary malignant neoplasm of bone: Secondary | ICD-10-CM | POA: Diagnosis not present

## 2024-04-24 DIAGNOSIS — Z510 Encounter for antineoplastic radiation therapy: Secondary | ICD-10-CM | POA: Insufficient documentation

## 2024-04-24 LAB — RAD ONC ARIA SESSION SUMMARY
Course Elapsed Days: 5
Plan Fractions Treated to Date: 4
Plan Fractions Treated to Date: 4
Plan Prescribed Dose Per Fraction: 3 Gy
Plan Prescribed Dose Per Fraction: 3 Gy
Plan Total Fractions Prescribed: 10
Plan Total Fractions Prescribed: 10
Plan Total Prescribed Dose: 30 Gy
Plan Total Prescribed Dose: 30 Gy
Reference Point Dosage Given to Date: 12 Gy
Reference Point Dosage Given to Date: 12 Gy
Reference Point Session Dosage Given: 3 Gy
Reference Point Session Dosage Given: 3 Gy
Session Number: 4

## 2024-04-24 NOTE — Progress Notes (Signed)
 Pharmacist Chemotherapy Monitoring - Initial Assessment    Anticipated start date: 05/02/24   The following has been reviewed per standard work regarding the patient's treatment regimen: The patient's diagnosis, treatment plan and drug doses, and organ/hematologic function Lab orders and baseline tests specific to treatment regimen  The treatment plan start date, drug sequencing, and pre-medications Prior authorization status  Patient's documented medication list, including drug-drug interaction screen and prescriptions for anti-emetics and supportive care specific to the treatment regimen The drug concentrations, fluid compatibility, administration routes, and timing of the medications to be used The patient's access for treatment and lifetime cumulative dose history, if applicable  The patient's medication allergies and previous infusion related reactions, if applicable   Changes made to treatment plan:  N/A  Follow up needed:  N/A  Beverely Suen, PharmD, MBA

## 2024-04-25 ENCOUNTER — Other Ambulatory Visit: Payer: Self-pay

## 2024-04-25 ENCOUNTER — Ambulatory Visit
Admission: RE | Admit: 2024-04-25 | Discharge: 2024-04-25 | Disposition: A | Discharge status: Home / Self Care | Source: Ambulatory Visit | Attending: Radiation Oncology

## 2024-04-25 ENCOUNTER — Telehealth: Payer: Self-pay | Admitting: Infectious Diseases

## 2024-04-25 DIAGNOSIS — C3411 Malignant neoplasm of upper lobe, right bronchus or lung: Secondary | ICD-10-CM | POA: Diagnosis not present

## 2024-04-25 DIAGNOSIS — Z510 Encounter for antineoplastic radiation therapy: Secondary | ICD-10-CM | POA: Diagnosis not present

## 2024-04-25 DIAGNOSIS — Z87891 Personal history of nicotine dependence: Secondary | ICD-10-CM | POA: Diagnosis not present

## 2024-04-25 DIAGNOSIS — C7951 Secondary malignant neoplasm of bone: Secondary | ICD-10-CM | POA: Diagnosis not present

## 2024-04-25 LAB — RAD ONC ARIA SESSION SUMMARY
Course Elapsed Days: 6
Plan Fractions Treated to Date: 5
Plan Fractions Treated to Date: 5
Plan Prescribed Dose Per Fraction: 3 Gy
Plan Prescribed Dose Per Fraction: 3 Gy
Plan Total Fractions Prescribed: 10
Plan Total Fractions Prescribed: 10
Plan Total Prescribed Dose: 30 Gy
Plan Total Prescribed Dose: 30 Gy
Reference Point Dosage Given to Date: 15 Gy
Reference Point Dosage Given to Date: 15 Gy
Reference Point Session Dosage Given: 3 Gy
Reference Point Session Dosage Given: 3 Gy
Session Number: 5

## 2024-04-25 NOTE — Telephone Encounter (Signed)
 Patient is here today wanted to make Dr. Eben aware he was recently diagnosed with lung cancer.  If possible would like him to review his chart and give him a phone call. Telephone number on file has been confirmed.  Future appointment has been scheduled on 05/09/24 at 2:30 pm.

## 2024-04-25 NOTE — Telephone Encounter (Signed)
 Copied from CRM [id number]. Topic: General - Other >> Apr 24, 2024  3:22 PM Emylou G wrote:  Reason for CRM: Pls call patient.. had questions about Cap application?

## 2024-04-26 ENCOUNTER — Other Ambulatory Visit

## 2024-04-26 ENCOUNTER — Ambulatory Visit
Admission: RE | Admit: 2024-04-26 | Discharge: 2024-04-26 | Disposition: A | Discharge status: Home / Self Care | Source: Ambulatory Visit | Attending: Radiation Oncology | Admitting: Radiation Oncology

## 2024-04-26 ENCOUNTER — Other Ambulatory Visit: Payer: Self-pay

## 2024-04-26 DIAGNOSIS — M899 Disorder of bone, unspecified: Secondary | ICD-10-CM | POA: Diagnosis not present

## 2024-04-26 DIAGNOSIS — Z510 Encounter for antineoplastic radiation therapy: Secondary | ICD-10-CM | POA: Diagnosis not present

## 2024-04-26 DIAGNOSIS — M898X2 Other specified disorders of bone, upper arm: Secondary | ICD-10-CM | POA: Diagnosis not present

## 2024-04-26 DIAGNOSIS — C3411 Malignant neoplasm of upper lobe, right bronchus or lung: Secondary | ICD-10-CM | POA: Diagnosis not present

## 2024-04-26 DIAGNOSIS — Z87891 Personal history of nicotine dependence: Secondary | ICD-10-CM | POA: Diagnosis not present

## 2024-04-26 DIAGNOSIS — M25511 Pain in right shoulder: Secondary | ICD-10-CM | POA: Diagnosis not present

## 2024-04-26 DIAGNOSIS — M25512 Pain in left shoulder: Secondary | ICD-10-CM | POA: Diagnosis not present

## 2024-04-26 DIAGNOSIS — C7951 Secondary malignant neoplasm of bone: Secondary | ICD-10-CM | POA: Diagnosis not present

## 2024-04-26 LAB — RAD ONC ARIA SESSION SUMMARY

## 2024-04-26 NOTE — Telephone Encounter (Signed)
 Noted that pt found to have R lung ca (adeno) during w/u of L shoulder pain-  as well as adrenal mass, pineal mass and multiple LN.  He has started XRT Will start CTX next week.  Has f/u appt with me 7-16. We confirmed this.

## 2024-04-28 ENCOUNTER — Other Ambulatory Visit (HOSPITAL_BASED_OUTPATIENT_CLINIC_OR_DEPARTMENT_OTHER): Payer: Self-pay | Admitting: Family

## 2024-04-28 NOTE — Progress Notes (Unsigned)
  [facility] OFFICE PROGRESS NOTE  Benjamin Clam, MD [address] [place] [address] [place] [address]  DIAGNOSIS:  stage IV (T3, N3, M1 C) non-small cell lung cancer, adenocarcinoma presented with large right upper lobe lung mass in addition to right hilar and mediastinal as well as right supraclavicular lymphadenopathy and metastatic bone lesions including the left proximal humerus, L2 as well as sacral area and left adrenal metastasis diagnosed in June 2025.  Molecular studies show PDL1 of 0%  PRIOR THERAPY: None  CURRENT THERAPY: Systemic chemotherapy with Carboplatin  for an AUC of 5, Alimta  500 mg/m2, and Libatyo IV every 3 weeks. First dose on 05/02/24.   INTERVAL HISTORY: Benjamin Herrera 58 y.o. male returns to the clinic for a follow up visit. The patient established care in the clinic on 04/16/24. The patient was recently diagnosed with lung cancer. He is here for his first cycle of treatment today. He had his chemoeducation class and does not have any questions. ***review moleculars.   Dr. Sherrod referred him to radiation oncology for palliative radiation to the ***. He is seeing Dr. Shannon***. His last day of radiation is scheduled   He has been endorsing left arm pain for which he uses OxyContin  for pain management. This was prescribed by ***. Dr. Sherrod  ***Dr. Sherrod recommends Xgeva . He will require Xgeva  for bone metastases.   He is scheduled for an FNA of the parotid gland on ***.   He denies fevers, chills, night sweats, or unexplained weight loss. His breathing is ***. His cough is ***. He denies hemoptysis or chest pain. He denies nausea, vomiting, diarrhea, or constipation. He denies headaches or vision changes. He denies rashes or skin changes. He is here for evaluation and repeat blood work before undergoing cycle #1  MEDICAL HISTORY: Past Medical History:  Diagnosis Date   Achalasia and cardiospasm    Allergy    Anxiety    Arthritis    back  ,shoulders    Asthma    Boils    under arms hx of   CAD (coronary artery disease)    a. 09/2016: cath showing 100% RCA occlusion with collaterals and 90% OM1 (treated with DES)   Candidiasis of mouth    Candidiasis of unspecified site    Cholelithiasis    Cocaine abuse (HCC)    Quit 09/2016   Condyloma acuminatum in male of scrotum & anal canal s/p laser ablation 07/03/2012   Coronary artery disease    Dysphagia    Fracture, humerus    Frozen shoulder    left   GERD (gastroesophageal reflux disease)    Heart attack (HCC)    Hemorrhoids    Hepatitis 1993   A   Herpes labialis    HIV (human immunodeficiency virus infection) (HCC) DX 1993   Hypertension Dx 2014   off all bp meds for last 9 or 10 months   Pilonidal disease 01/10/2012   Polysubstance abuse (HCC)    Psoriasis    hx of   Sleep apnea    does  have CPAP   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  MEDICATIONS:  Current Outpatient Medications  Medication Sig Dispense Refill   acetaminophen  (TYLENOL ) 325 MG tablet Take 650 mg by mouth every 6 (six) hours as needed for mild pain (pain  score 1-3), moderate pain (pain score 4-6) or headache.     albuterol  (VENTOLIN  HFA) 108 (90 Base) MCG/ACT inhaler Inhale 2 puffs into the lungs every 6 (six) hours as needed for wheezing or shortness of breath. 1 each 2   amLODipine  (NORVASC ) 5 MG tablet Take 1 tablet (5 mg total) by mouth daily. 90 tablet 3   aspirin  81 MG chewable tablet Chew 1 tablet (81 mg total) by mouth daily. 90 tablet 3   BIKTARVY  30-120-15 MG TABS TAKE 1 TABLET BY MOUTH EVERY DAY 30 tablet 11   Blood Pressure KIT 1 kit by Does not apply route daily. 1 kit 0   diclofenac  Sodium (VOLTAREN  ARTHRITIS PAIN) 1 % GEL Apply 2 g topically 4 (four) times daily. 100 g 3   folic acid  (FOLVITE ) 1 MG tablet Take 1  tablet (1 mg total) by mouth daily. Start 7 days before pemetrexed  chemotherapy. Continue until 21 days after pemetrexed  completed. 100 tablet 3   lidocaine -prilocaine  (EMLA ) cream Apply to affected area once 30 g 3   meloxicam  (MOBIC ) 15 MG tablet Take 1 tablet (15 mg total) by mouth daily. (Patient not taking: Reported on 04/13/2024) 30 tablet 1   methocarbamol  (ROBAXIN ) 500 MG tablet Take 2 tablets (1,000 mg total) by mouth every 8 (eight) hours as needed. 90 tablet 1   metoprolol  succinate (TOPROL -XL) 25 MG 24 hr tablet TAKE 1/2 TABLET BY MOUTH DAILY 45 tablet 0   nitroGLYCERIN  (NITROSTAT ) 0.4 MG SL tablet Place 1 tablet (0.4 mg total) under the tongue every 5 (five) minutes as needed for chest pain (trouble swallowing). 25 tablet 1   ondansetron  (ZOFRAN ) 8 MG tablet Take 1 tablet (8 mg total) by mouth every 8 (eight) hours as needed for nausea or vomiting. Start on the third day after carboplatin . 30 tablet 1   oxyCODONE -acetaminophen  (PERCOCET/ROXICET) 5-325 MG tablet Take 1 tablet by mouth every 8 (eight) hours as needed for severe pain (pain score 7-10). 30 tablet 0   prochlorperazine  (COMPAZINE ) 10 MG tablet Take 1 tablet (10 mg total) by mouth every 6 (six) hours as needed for nausea or vomiting. 30 tablet 1   No current facility-administered medications for this visit.    SURGICAL HISTORY:  Past Surgical History:  Procedure Laterality Date   BALLOON DILATION N/A 11/11/2015   Procedure: BALLOON DILATION;  Surgeon: Gustav Shila GAILS, MD;  Location: [facility];  Service: Endoscopy;  Laterality: N/A;   BOTOX  INJECTION  10/11/2012   Procedure: BOTOX  INJECTION;  Surgeon: Lamar JONETTA Aho, MD;  Location: [facility];  Service: Endoscopy;  Laterality: N/A;   BOTOX  INJECTION N/A 01/25/2013   Procedure: BOTOX  INJECTION;  Surgeon: Lamar JONETTA Aho, MD;  Location: [facility];  Service: Endoscopy;  Laterality: N/A;   BOTOX  INJECTION N/A 06/21/2013   Procedure: BOTOX  INJECTION;  Surgeon: Lamar JONETTA Aho, MD;  Location: [facility];  Service: Endoscopy;  Laterality: N/A;   BOTOX  INJECTION N/A 10/08/2013   Procedure: BOTOX  INJECTION;  Surgeon: Lamar JONETTA Aho, MD;  Location: [facility];  Service: Endoscopy;  Laterality: N/A;   BOTOX  INJECTION N/A 06/16/2015   Procedure: BOTOX  INJECTION;  Surgeon: Lamar JONETTA Aho, MD;  Location: [facility];  Service: Endoscopy;  Laterality: N/A;   BRONCHIAL BIOPSY  04/03/2024   Procedure: BRONCHOSCOPY, WITH BIOPSY;  Surgeon: Shelah Lamar RAMAN, MD;  Location: [facility];  Service: Pulmonary;;   BRONCHIAL BRUSHINGS  04/03/2024   Procedure: BRONCHOSCOPY, WITH BRUSH BIOPSY;  Surgeon: Shelah Lamar RAMAN, MD;  Location:  [facility];  Service: Pulmonary;;   BRONCHIAL NEEDLE ASPIRATION BIOPSY  04/03/2024   Procedure: BRONCHOSCOPY, WITH NEEDLE ASPIRATION BIOPSY;  Surgeon: Shelah Lamar RAMAN, MD;  Location: [facility];  Service: Pulmonary;;   BRONCHOSCOPY, WITH BIOPSY USING ELECTROMAGNETIC NAVIGATION Right 04/03/2024   Procedure: BRONCHOSCOPY, WITH BIOPSY USING ELECTROMAGNETIC NAVIGATION;  Surgeon: Shelah Lamar RAMAN, MD;  Location: [facility];  Service: Pulmonary;  Laterality: Right;   CARDIAC CATHETERIZATION N/A 10/11/2016   Procedure: Left Heart Cath and Coronary Angiography;  Surgeon: Lonni JONETTA Cash, MD;  mRCA 100% w/ L>R collaterals, OM1 90%   CARDIAC CATHETERIZATION N/A 10/11/2016   Procedure: Coronary Stent Intervention;  Surgeon: Lonni JONETTA Cash, MD;  RESOLUTE ONYX 3.5X15 DES OM1   COLONOSCOPY  05/19/2012   Procedure: COLONOSCOPY;  Surgeon: Lamar JONETTA Aho, MD;  Location: [facility];  Service: Endoscopy;  Laterality: N/A;  jill trying to contact pt to come in 0830 for 930 case, phone not accepting messages   COLONOSCOPY     ESOPHAGEAL MANOMETRY  05/29/2012   Procedure: ESOPHAGEAL MANOMETRY (EM);  Surgeon: Lamar JONETTA Aho, MD;  Location: [facility];  Service: Endoscopy;  Laterality: N/A;   ESOPHAGEAL MANOMETRY N/A 10/06/2015   Procedure:  ESOPHAGEAL MANOMETRY (EM);  Surgeon: Gustav Shila GAILS, MD;  Location: [facility];  Service: Endoscopy;  Laterality: N/A;   ESOPHAGOGASTRODUODENOSCOPY  11/11/2011   Procedure: ESOPHAGOGASTRODUODENOSCOPY (EGD);  Surgeon: Lamar JONETTA Aho, MD;  Location: [facility];  Service: Endoscopy;  Laterality: N/A;  botox  injection  called Pt to change time of procedure per Dr Aho   ESOPHAGOGASTRODUODENOSCOPY  03/10/2012   Procedure: ESOPHAGOGASTRODUODENOSCOPY (EGD);  Surgeon: Lamar JONETTA Aho, MD;  Location: [facility];  Service: Endoscopy;  Laterality: N/A;   ESOPHAGOGASTRODUODENOSCOPY  10/11/2012   Procedure: ESOPHAGOGASTRODUODENOSCOPY (EGD);  Surgeon: Lamar JONETTA Aho, MD;  Location: [facility];  Service: Endoscopy;  Laterality: N/A;   ESOPHAGOGASTRODUODENOSCOPY N/A 01/25/2013   Procedure: ESOPHAGOGASTRODUODENOSCOPY (EGD);  Surgeon: Lamar JONETTA Aho, MD;  Location: [facility];  Service: Endoscopy;  Laterality: N/A;   ESOPHAGOGASTRODUODENOSCOPY N/A 06/21/2013   Procedure: ESOPHAGOGASTRODUODENOSCOPY (EGD);  Surgeon: Lamar JONETTA Aho, MD;  Location: [facility];  Service: Endoscopy;  Laterality: N/A;   ESOPHAGOGASTRODUODENOSCOPY (EGD) WITH PROPOFOL  N/A 10/08/2013   Procedure: ESOPHAGOGASTRODUODENOSCOPY (EGD) WITH PROPOFOL ;  Surgeon: Lamar JONETTA Aho, MD;  Location: [facility];  Service: Endoscopy;  Laterality: N/A;   ESOPHAGOGASTRODUODENOSCOPY (EGD) WITH PROPOFOL  N/A 06/16/2015   Procedure: ESOPHAGOGASTRODUODENOSCOPY (EGD) WITH PROPOFOL ;  Surgeon: Lamar JONETTA Aho, MD;  Location: [facility];  Service: Endoscopy;  Laterality: N/A;   ESOPHAGOGASTRODUODENOSCOPY (EGD) WITH PROPOFOL  N/A 11/11/2015   Procedure: ESOPHAGOGASTRODUODENOSCOPY (EGD) WITH PROPOFOL ;  Surgeon: Gustav Shila GAILS, MD;  Location: [facility];  Service: Endoscopy;  Laterality: N/A;   EXAMINATION UNDER ANESTHESIA  07/24/2012   Procedure: EXAM UNDER ANESTHESIA;  Surgeon: Elspeth KYM Schultze, MD;  Location: [facility];  Service: General;   Laterality: N/A;   humeral fracture surgery Right yrs ago   PILONIDAL CYST EXCISION  07/24/2012   Procedure: CYST EXCISION PILONIDAL SIMPLE;  Surgeon: Elspeth KYM Schultze, MD;  Location: [facility];  Service: General;  Laterality: N/A;  Exam Under Anesthesia,, Excision Pilonidal Disease,    right shoulder replacement  2008   x 2   UPPER GASTROINTESTINAL ENDOSCOPY     WART FULGURATION  07/24/2012   Procedure: FULGURATION ANAL WART;  Surgeon: Elspeth KYM Schultze, MD;  Location: [facility];  Service: General;  Laterality: N/A;  excision of raphe mass    REVIEW OF SYSTEMS:   Review of  Systems  Constitutional: Negative for appetite change, chills, fatigue, fever and unexpected weight change.  HENT:   Negative for mouth sores, nosebleeds, sore throat and trouble swallowing.   Eyes: Negative for eye problems and icterus.  Respiratory: Negative for cough, hemoptysis, shortness of breath and wheezing.   Cardiovascular: Negative for chest pain and leg swelling.  Gastrointestinal: Negative for abdominal pain, constipation, diarrhea, nausea and vomiting.  Genitourinary: Negative for bladder incontinence, difficulty urinating, dysuria, frequency and hematuria.   Musculoskeletal: Negative for back pain, gait problem, neck pain and neck stiffness.  Skin: Negative for itching and rash.  Neurological: Negative for dizziness, extremity weakness, gait problem, headaches, light-headedness and seizures.  Hematological: Negative for adenopathy. Does not bruise/bleed easily.  Psychiatric/Behavioral: Negative for confusion, depression and sleep disturbance. The patient is not nervous/anxious.     PHYSICAL EXAMINATION:  There were no vitals taken for this visit.  ECOG PERFORMANCE STATUS: {CHL ONC ECOG [id number]  Physical Exam  Constitutional: Oriented to person, place, and time and well-developed, well-nourished, and in no distress. No distress.  HENT:  Head: Normocephalic and atraumatic.  Mouth/Throat: Oropharynx is  clear and moist. No oropharyngeal exudate.  Eyes: Conjunctivae are normal. Right eye exhibits no discharge. Left eye exhibits no discharge. No scleral icterus.  Neck: Normal range of motion. Neck supple.  Cardiovascular: Normal rate, regular rhythm, normal heart sounds and intact distal pulses.   Pulmonary/Chest: Effort normal and breath sounds normal. No respiratory distress. No wheezes. No rales.  Abdominal: Soft. Bowel sounds are normal. Exhibits no distension and no mass. There is no tenderness.  Musculoskeletal: Normal range of motion. Exhibits no edema.  Lymphadenopathy:    No cervical adenopathy.  Neurological: Alert and oriented to person, place, and time. Exhibits normal muscle tone. Gait normal. Coordination normal.  Skin: Skin is warm and dry. No rash noted. Not diaphoretic. No erythema. No pallor.  Psychiatric: Mood, memory and judgment normal.  Vitals reviewed.  LABORATORY DATA: Lab Results  Component Value Date   WBC 4.7 04/16/2024   HGB 13.2 04/16/2024   HCT 40.7 04/16/2024   MCV 77.4 (L) 04/16/2024   PLT 163 04/16/2024      Chemistry      Component Value Date/Time   NA 141 04/16/2024 1343   NA 139 01/05/2024 1421   K 3.7 04/16/2024 1343   CL 109 04/16/2024 1343   CO2 26 04/16/2024 1343   BUN 11 04/16/2024 1343   BUN 16 01/05/2024 1421   CREATININE 0.92 04/16/2024 1343   CREATININE 0.96 07/30/2020 1437      Component Value Date/Time   CALCIUM  9.5 04/16/2024 1343   ALKPHOS 79 04/16/2024 1343   AST 13 (L) 04/16/2024 1343   ALT 9 04/16/2024 1343   BILITOT 0.4 04/16/2024 1343       RADIOGRAPHIC STUDIES:  NM PET Image Initial (PI) Skull Base To Thigh Result Date: 04/09/2024 CLINICAL DATA:  Initial treatment strategy for lung cancer. EXAM: NUCLEAR MEDICINE PET SKULL BASE TO THIGH TECHNIQUE: 9.5 mCi F-18 FDG was injected intravenously. Full-ring PET imaging was performed from the skull base to thigh after the radiotracer. CT data was obtained and used for  attenuation correction and anatomic localization. Fasting blood glucose: 90 mg/dl COMPARISON:  93/97/7974 FINDINGS: Mediastinal blood pool activity: SUV max 2.1 Liver activity: SUV max NA NECK: Hypermetabolic right level IV lymph node adjacent to hypermetabolic supraclavicular nodes. The right level IV node measures 0.6 cm in short axis on image 45 series 4 with maximum SUV 8.6.  Anterior right parotid soft tissue density nodule measuring about 7 mm in diameter has maximum SUV of 4.9. Incidental CT findings: Unremarkable CHEST: Hypermetabolic right supraclavicular, right paratracheal, right hilar, left paratracheal, paraesophageal, subcarinal, bilateral axillary, and left subpectoral pathologic adenopathy along with some small subcutaneous lymph nodes along the left lateral chest. Index right hilar node 2.3 cm in short axis with maximum SUV 23.0. Index subcarinal node 1.5 cm in short axis on image 77 series 4 with maximum SUV 20.7. The medial right upper lobe mass extending along the mediastinal margin measures of bowel 3.8 by 2.7 cm with maximum SUV 19.7. Mild nodularity believed to probably be in the right upper lobe along the margin of the major fissure measuring 0.9 by 0.5 cm on image 34 series 7 has maximum SUV of 2.1; exact relationship of this nodularity with the minor fissure is difficult to discern. Incidental CT findings: Atheromatous vascular calcification of the aortic arch and coronary arteries. Emphysema. Dilated esophagus. ABDOMEN/PELVIS: Faintly accentuated metabolic activity in the left adrenal gland with maximum SUV of 4.3 but no well-defined adrenal mass. Right adrenal gland maximum SUV 3.0. Small bilateral inguinal lymph nodes have low-grade activity. Incidental CT findings: Atherosclerosis is present, including aortoiliac atherosclerotic disease. Cholelithiasis. SKELETON: Scattered lytic osseous metastatic disease including the posterior right clavicle, the proximal left humerus (with pathologic  fracture) the left sacrum, and the left L2 vertebral body. Index proximal left humeral lesion with maximum SUV 20.6. Index lesion in the left L2 vertebral body measures 2.6 cm in long axis with maximum SUV 20.2. Incidental CT findings: None. IMPRESSION: 1. Hypermetabolic medial right upper lobe mass extending along the mediastinal margin, compatible with primary bronchogenic carcinoma. 2. Extensive hypermetabolic pathologic adenopathy in the chest and right lower neck, compatible with metastatic adenopathy. 3. Scattered lytic osseous metastatic disease including the posterior right clavicle, the proximal left humerus (with pathologic fracture), the left sacrum, and the left L2 vertebral body. 4. Faintly accentuated metabolic activity in the left adrenal gland with maximum SUV of 4.3 but no well-defined adrenal mass. Right adrenal gland maximum SUV 3.0. Surveillance suggested. 5. Anterior right parotid soft tissue density nodule measuring about 7 mm in diameter has maximum SUV of 4.9. This is nonspecific and could be a primary parotid neoplasm or a metastatic lymph node. If the patient is an active smoker, Warthin's tumor is likely; otherwise, otolaryngology referral for potential biopsy may be indicated. 6. Cholelithiasis. 7. Aortic Atherosclerosis (ICD10-I70.0) and Emphysema (ICD10-J43.9). Electronically Signed   By: Ryan Salvage M.D.   On: 04/09/2024 14:12   MR Brain W Wo Contrast Result Date: 04/06/2024 CLINICAL DATA:  New lung cancer diagnosis, staging workup. EXAM: MRI HEAD WITHOUT AND WITH CONTRAST TECHNIQUE: Multiplanar, multiecho pulse sequences of the brain and surrounding structures were obtained without and with intravenous contrast. CONTRAST:  9mL GADAVIST  GADOBUTROL  1 MMOL/ML IV SOLN COMPARISON:  None Available. FINDINGS: Brain: No acute infarct. No evidence of intracranial hemorrhage. Nonspecific scattered punctate foci of T2/FLAIR hyperintensity in the periventricular and subcortical white  matter. There is a 1.2 x 1.0 x 0.9 cm enhancing mass in the region of the pineal gland abutting the tectum of the midbrain. The superior and slightly posterior aspect of the mass abuts the internal cerebral veins. There are areas of susceptibility within the mass suggestive of calcification which appear to be primarily within the peripheral aspect of the lesion. No edema, mass effect, or midline shift. The basilar cisterns are patent. No extra-axial fluid collections. Ventricles: Normal size and  configuration of the ventricles. Vascular: Skull base flow voids are visualized. Skull and upper cervical spine: Degenerative changes in the visualized upper cervical spine. Visualized calvarium is unremarkable. Sinuses/Orbits: Orbits are symmetric. Mild mucosal thickening in the right maxillary sinus. Other: Mastoid air cells are clear. There is a 0 0.7 x 0.7 x 1.1 cm T2 hyperintense lesion within the superficial lobe of the left parotid gland. Additional 1.8 x 1.8 cm nonenhancing lesion in the right parotid tail is partially visualized (series 1100, image 58). 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 Differential considerations include metastatic disease, pineocytoma, or other pineal parenchymal tumor. No abnormal enhancement within the brain parenchyma. 1.1 cm nonenhancing lesion within the superficial lobe of the left parotid gland. Additional 1.8 cm nonenhancing lesion in the right parotid tail which is partially visualized. Findings could reflect parotid neoplasms. Recommend correlation with tissue sampling. Mild chronic microvascular ischemic changes. Electronically Signed   By: Donnice Mania M.D.   On: 04/06/2024 15:47   DG Chest Port 1 View Result Date: 04/03/2024 CLINICAL DATA:  Status post bronchoscopy EXAM: PORTABLE CHEST 1 VIEW COMPARISON:  October 29, 2021 FINDINGS: Mild bilateral interstitial  prominence of the bases of the lungs similar to prior examination without other infiltrates consolidations or pulmonary edema. No pneumothorax IMPRESSION: Mild bilateral interstitial prominence of the bases of the lungs similar to prior examination without other infiltrates consolidations or pulmonary edema. Post bronchoscopy without evidence of pneumothorax Electronically Signed   By: Franky Chard M.D.   On: 04/03/2024 15:22   DG C-ARM BRONCHOSCOPY Result Date: 04/03/2024 C-ARM BRONCHOSCOPY: Fluoroscopy was utilized by the requesting physician.  No radiographic interpretation.   MR SHOULDER LEFT W WO CONTRAST Result Date: 03/30/2024 MR SHOULDER WITHOUT THEN WITH IV CONTRAST LEFT COMPARISON: X-ray 03/19/2024 CLINICAL HISTORY: History of cancer with left shoulder pain. PULSE SEQUENCES: Ax PD FS, Sag T2 FS, Cor T1 & COR T2 FS with and without 9 mL of IV contrast. FINDINGS: There is a large destructive mass replacing the proximal humeral metaphysis and diaphysis measuring approximately 4.4 x 4.9 x 6.6 cm in size. There is significant edema and destruction of the cortex and medullary bone. Findings are consistent with a large metastatic lesion in patient with history of lung mass. There is likely a pathologic fracture due to the degree of destruction of bone. Otherwise, limited evaluation of the bony structures are unremarkable. Rotator cuff, biceps tendon and labrum are grossly intact. IMPRESSION: Large mass replacing the proximal humeral metaphysis diaphysis with destruction of the cortex. There is a soft tissue component surrounding the proximal humerus. Finding is consistent with metastatic disease in patient with history of lung mass. There is either a pathologic fracture or pending pathologic fracture present. Electronically signed by: Norleen Satchel MD 03/30/2024 09:49 AM EDT RP Workstation: [id number]     ASSESSMENT/PLAN:  This is a very pleasant 58 years old African-American male recently diagnosed  with a stage IV (T3, N3, M1 C) non-small cell lung cancer, adenocarcinoma presented with large right upper lobe lung mass in addition to right hilar and mediastinal as well as right supraclavicular lymphadenopathy and metastatic bone lesions including the left proximal humerus, L2 as well as sacral area and left adrenal metastasis diagnosed in June 2025. His molecular studies show ***. His PDL1 expression is 0  He is currently undergoing palliative radiation to the humerus and *** under the care of Dr. Shannon. The last day  of radiation is scheduled for 05/03/24.   Labs were reviewed. Recommend he *** with cycle ***  #1 today as scheduled.   We will see him back in 1 week for a 1 week follow up visit and to manage any adverse side effects of treatment.   He will continue with his parotid biopsy on ***.   We will continue to monitor his labs closely on a weekly basis.   ***pain   The patient was advised to call immediately if she has any concerning symptoms in the interval. The patient voices understanding of current disease status and treatment options and is in agreement with the current care plan. All questions were answered. The patient knows to call the clinic with any problems, questions or concerns. We can certainly see the patient much sooner if necessary        No orders of the defined types were placed in this encounter.    I spent {CHL ONC TIME VISIT - DTPQU:[id number]} counseling the patient face to face. The total time spent in the appointment was {CHL ONC TIME VISIT - DTPQU:[id number]}.  Corin Tilly L Byrdie Miyazaki, PA-C 04/28/24

## 2024-04-30 ENCOUNTER — Other Ambulatory Visit: Payer: Self-pay

## 2024-04-30 ENCOUNTER — Inpatient Hospital Stay

## 2024-04-30 ENCOUNTER — Ambulatory Visit
Admission: RE | Admit: 2024-04-30 | Discharge: 2024-04-30 | Disposition: A | Discharge status: Home / Self Care | Source: Ambulatory Visit | Attending: Radiation Oncology

## 2024-04-30 DIAGNOSIS — Z510 Encounter for antineoplastic radiation therapy: Secondary | ICD-10-CM | POA: Diagnosis not present

## 2024-04-30 DIAGNOSIS — C3411 Malignant neoplasm of upper lobe, right bronchus or lung: Secondary | ICD-10-CM | POA: Diagnosis not present

## 2024-04-30 DIAGNOSIS — C7951 Secondary malignant neoplasm of bone: Secondary | ICD-10-CM | POA: Diagnosis not present

## 2024-04-30 DIAGNOSIS — Z87891 Personal history of nicotine dependence: Secondary | ICD-10-CM | POA: Diagnosis not present

## 2024-04-30 LAB — RAD ONC ARIA SESSION SUMMARY
Course Elapsed Days: 11
Plan Fractions Treated to Date: 7
Plan Fractions Treated to Date: 7
Plan Prescribed Dose Per Fraction: 3 Gy
Plan Prescribed Dose Per Fraction: 3 Gy
Plan Total Fractions Prescribed: 10
Plan Total Fractions Prescribed: 10
Plan Total Prescribed Dose: 30 Gy
Plan Total Prescribed Dose: 30 Gy
Reference Point Dosage Given to Date: 21 Gy
Reference Point Dosage Given to Date: 21 Gy
Reference Point Session Dosage Given: 3 Gy
Reference Point Session Dosage Given: 3 Gy
Session Number: 7

## 2024-05-01 ENCOUNTER — Ambulatory Visit: Admitting: Radiation Oncology

## 2024-05-01 ENCOUNTER — Other Ambulatory Visit: Payer: Self-pay

## 2024-05-01 ENCOUNTER — Inpatient Hospital Stay

## 2024-05-01 ENCOUNTER — Ambulatory Visit
Admission: RE | Admit: 2024-05-01 | Discharge: 2024-05-01 | Disposition: A | Discharge status: Home / Self Care | Source: Ambulatory Visit | Attending: Radiation Oncology | Admitting: Radiation Oncology

## 2024-05-01 DIAGNOSIS — Z5189 Encounter for other specified aftercare: Secondary | ICD-10-CM | POA: Insufficient documentation

## 2024-05-01 DIAGNOSIS — C7951 Secondary malignant neoplasm of bone: Secondary | ICD-10-CM | POA: Insufficient documentation

## 2024-05-01 DIAGNOSIS — Z5111 Encounter for antineoplastic chemotherapy: Secondary | ICD-10-CM | POA: Insufficient documentation

## 2024-05-01 DIAGNOSIS — Z79630 Long term (current) use of alkylating agent: Secondary | ICD-10-CM | POA: Insufficient documentation

## 2024-05-01 DIAGNOSIS — Z79631 Long term (current) use of antimetabolite agent: Secondary | ICD-10-CM | POA: Insufficient documentation

## 2024-05-01 DIAGNOSIS — Z510 Encounter for antineoplastic radiation therapy: Secondary | ICD-10-CM | POA: Diagnosis not present

## 2024-05-01 DIAGNOSIS — C3411 Malignant neoplasm of upper lobe, right bronchus or lung: Secondary | ICD-10-CM | POA: Insufficient documentation

## 2024-05-01 DIAGNOSIS — C3491 Malignant neoplasm of unspecified part of right bronchus or lung: Secondary | ICD-10-CM

## 2024-05-01 DIAGNOSIS — Z87891 Personal history of nicotine dependence: Secondary | ICD-10-CM | POA: Diagnosis not present

## 2024-05-01 LAB — RAD ONC ARIA SESSION SUMMARY
Course Elapsed Days: 12
Plan Fractions Treated to Date: 8
Plan Fractions Treated to Date: 8
Plan Prescribed Dose Per Fraction: 3 Gy
Plan Prescribed Dose Per Fraction: 3 Gy
Plan Total Fractions Prescribed: 10
Plan Total Fractions Prescribed: 10
Plan Total Prescribed Dose: 30 Gy
Plan Total Prescribed Dose: 30 Gy
Reference Point Dosage Given to Date: 24 Gy
Reference Point Dosage Given to Date: 24 Gy
Reference Point Session Dosage Given: 3 Gy
Reference Point Session Dosage Given: 3 Gy
Session Number: 8

## 2024-05-01 MED FILL — Fosaprepitant Dimeglumine For IV Infusion 150 MG (Base Eq): INTRAVENOUS | Qty: 5 | Status: AC

## 2024-05-02 ENCOUNTER — Other Ambulatory Visit: Payer: Self-pay

## 2024-05-02 ENCOUNTER — Other Ambulatory Visit: Payer: Self-pay | Admitting: Radiology

## 2024-05-02 ENCOUNTER — Telehealth: Payer: Self-pay | Admitting: Radiation Therapy

## 2024-05-02 ENCOUNTER — Ambulatory Visit
Admission: RE | Admit: 2024-05-02 | Discharge: 2024-05-02 | Disposition: A | Discharge status: Home / Self Care | Source: Ambulatory Visit | Attending: Radiation Oncology

## 2024-05-02 ENCOUNTER — Inpatient Hospital Stay

## 2024-05-02 ENCOUNTER — Inpatient Hospital Stay (HOSPITAL_BASED_OUTPATIENT_CLINIC_OR_DEPARTMENT_OTHER): Admitting: Physician Assistant

## 2024-05-02 VITALS — BP 143/85 | HR 94 | Temp 98.4°F | Resp 16 | Wt 197.7 lb

## 2024-05-02 DIAGNOSIS — C3491 Malignant neoplasm of unspecified part of right bronchus or lung: Secondary | ICD-10-CM

## 2024-05-02 DIAGNOSIS — C3411 Malignant neoplasm of upper lobe, right bronchus or lung: Secondary | ICD-10-CM | POA: Diagnosis not present

## 2024-05-02 DIAGNOSIS — F40240 Claustrophobia: Secondary | ICD-10-CM

## 2024-05-02 DIAGNOSIS — Z87891 Personal history of nicotine dependence: Secondary | ICD-10-CM | POA: Diagnosis not present

## 2024-05-02 DIAGNOSIS — Z510 Encounter for antineoplastic radiation therapy: Secondary | ICD-10-CM | POA: Diagnosis not present

## 2024-05-02 DIAGNOSIS — C7951 Secondary malignant neoplasm of bone: Secondary | ICD-10-CM

## 2024-05-02 LAB — RAD ONC ARIA SESSION SUMMARY
Course Elapsed Days: 13
Plan Fractions Treated to Date: 9
Plan Fractions Treated to Date: 9
Plan Prescribed Dose Per Fraction: 3 Gy
Plan Prescribed Dose Per Fraction: 3 Gy
Plan Total Fractions Prescribed: 10
Plan Total Fractions Prescribed: 10
Plan Total Prescribed Dose: 30 Gy
Plan Total Prescribed Dose: 30 Gy
Reference Point Dosage Given to Date: 27 Gy
Reference Point Dosage Given to Date: 27 Gy
Reference Point Session Dosage Given: 3 Gy
Reference Point Session Dosage Given: 3 Gy
Session Number: 9

## 2024-05-02 LAB — CBC WITH DIFFERENTIAL (CANCER CENTER ONLY)
Abs Immature Granulocytes: 0.01 K/uL (ref 0.00–0.07)
Basophils Absolute: 0 K/uL (ref 0.0–0.1)
Basophils Relative: 0 %
Eosinophils Absolute: 0.2 K/uL (ref 0.0–0.5)
Eosinophils Relative: 8 %
HCT: 39.7 % (ref 39.0–52.0)
Hemoglobin: 13 g/dL (ref 13.0–17.0)
Immature Granulocytes: 0 %
Lymphocytes Relative: 15 %
Lymphs Abs: 0.4 K/uL — ABNORMAL LOW (ref 0.7–4.0)
MCH: 25 pg — ABNORMAL LOW (ref 26.0–34.0)
MCHC: 32.7 g/dL (ref 30.0–36.0)
MCV: 76.2 fL — ABNORMAL LOW (ref 80.0–100.0)
Monocytes Absolute: 0.2 K/uL (ref 0.1–1.0)
Monocytes Relative: 9 %
Neutro Abs: 1.8 K/uL (ref 1.7–7.7)
Neutrophils Relative %: 68 %
Platelet Count: 170 K/uL (ref 150–400)
RBC: 5.21 MIL/uL (ref 4.22–5.81)
RDW: 14.8 % (ref 11.5–15.5)
WBC Count: 2.6 K/uL — ABNORMAL LOW (ref 4.0–10.5)
nRBC: 0 % (ref 0.0–0.2)

## 2024-05-02 LAB — CMP (CANCER CENTER ONLY)
ALT: 8 U/L (ref 0–44)
AST: 13 U/L — ABNORMAL LOW (ref 15–41)
Albumin: 3.9 g/dL (ref 3.5–5.0)
Alkaline Phosphatase: 72 U/L (ref 38–126)
Anion gap: 6 (ref 5–15)
BUN: 10 mg/dL (ref 6–20)
CO2: 29 mmol/L (ref 22–32)
Calcium: 9.7 mg/dL (ref 8.9–10.3)
Chloride: 105 mmol/L (ref 98–111)
Creatinine: 0.75 mg/dL (ref 0.61–1.24)
GFR, Estimated: >60 mL/min (ref >60)
Glucose, Bld: 100 mg/dL — ABNORMAL HIGH (ref 70–99)
Potassium: 3.4 mmol/L — ABNORMAL LOW (ref 3.5–5.1)
Sodium: 140 mmol/L (ref 135–145)
Total Bilirubin: 0.5 mg/dL (ref 0.0–1.2)
Total Protein: 7.4 g/dL (ref 6.5–8.1)

## 2024-05-02 LAB — TSH: TSH: 0.234 u[IU]/mL — ABNORMAL LOW (ref 0.350–4.500)

## 2024-05-02 MED ORDER — PALONOSETRON HCL INJECTION 0.25 MG/5ML
0.2500 mg | Freq: Once | INTRAVENOUS | Status: AC
  Administered 2024-05-02: 0.25 mg via INTRAVENOUS
  Filled 2024-05-02: qty 5

## 2024-05-02 MED ORDER — SODIUM CHLORIDE 0.9 % IV SOLN
150.0000 mg | Freq: Once | INTRAVENOUS | Status: AC
  Administered 2024-05-02: 150 mg via INTRAVENOUS
  Filled 2024-05-02: qty 150

## 2024-05-02 MED ORDER — SODIUM CHLORIDE 0.9 % IV SOLN: INTRAVENOUS | Status: DC

## 2024-05-02 MED ORDER — DEXAMETHASONE SODIUM PHOSPHATE 10 MG/ML IJ SOLN
10.0000 mg | Freq: Once | INTRAMUSCULAR | Status: AC
  Administered 2024-05-02: 10 mg via INTRAVENOUS

## 2024-05-02 MED ORDER — SODIUM CHLORIDE 0.9 % IV SOLN
679.5000 mg | Freq: Once | INTRAVENOUS | Status: AC
  Administered 2024-05-02: 679.5 mg via INTRAVENOUS
  Filled 2024-05-02: qty 67.95

## 2024-05-02 MED ORDER — SODIUM CHLORIDE 0.9 % IV SOLN
500.0000 mg/m2 | Freq: Once | INTRAVENOUS | Status: AC
  Administered 2024-05-02: 1100 mg via INTRAVENOUS
  Filled 2024-05-02: qty 40

## 2024-05-02 MED ORDER — LORAZEPAM 0.5 MG PO TABS: 0.5000 mg | ORAL_TABLET | ORAL | 0 refills | Status: DC | PRN

## 2024-05-02 MED ORDER — DENOSUMAB 120 MG/1.7ML ~~LOC~~ SOLN
120.0000 mg | Freq: Once | SUBCUTANEOUS | Status: AC
  Administered 2024-05-02: 120 mg via SUBCUTANEOUS
  Filled 2024-05-02: qty 1.7

## 2024-05-02 NOTE — Patient Instructions (Signed)
 [facility] - A DEPT OF [facility]  Discharge Instructions: Thank you for choosing [facility] to provide your oncology and hematology care.   If you have a lab appointment with the Cancer Center, please go directly to the Cancer Center and check in at the registration area.   Wear comfortable clothing and clothing appropriate for easy access to any Portacath or PICC line.   We strive to give you quality time with your provider. You may need to reschedule your appointment if you arrive late (15 or more minutes).  Arriving late affects you and other patients whose appointments are after yours.  Also, if you miss three or more appointments without notifying the office, you may be dismissed from the clinic at the provider's discretion.      For prescription refill requests, have your pharmacy contact our office and allow 72 hours for refills to be completed.    Today you received the following chemotherapy and/or immunotherapy agents: CARBOplatin  (PARAPLATIN ) and PEMEtrexed  Disodium (ALIMTA )    To help prevent nausea and vomiting after your treatment, we encourage you to take your nausea medication as directed.  BELOW ARE SYMPTOMS THAT SHOULD BE REPORTED IMMEDIATELY: *FEVER GREATER THAN 100.4 F (38 C) OR HIGHER *CHILLS OR SWEATING *NAUSEA AND VOMITING THAT IS NOT CONTROLLED WITH YOUR NAUSEA MEDICATION *UNUSUAL SHORTNESS OF BREATH *UNUSUAL BRUISING OR BLEEDING *URINARY PROBLEMS (pain or burning when urinating, or frequent urination) *BOWEL PROBLEMS (unusual diarrhea, constipation, pain near the anus) TENDERNESS IN MOUTH AND THROAT WITH OR WITHOUT PRESENCE OF ULCERS (sore throat, sores in mouth, or a toothache) UNUSUAL RASH, SWELLING OR PAIN  UNUSUAL VAGINAL DISCHARGE OR ITCHING   Items with * indicate a potential emergency and should be followed up as soon as possible or go to the Emergency Department if any problems should occur.  Please show  the CHEMOTHERAPY ALERT CARD or IMMUNOTHERAPY ALERT CARD at check-in to the Emergency Department and triage nurse.  Should you have questions after your visit or need to cancel or reschedule your appointment, please contact [facility] - A DEPT OF [facility]  Dept: [phone]  and follow the prompts.  Office hours are 8:00 a.m. to 4:30 p.m. Monday - Friday. Please note that voicemails left after 4:00 p.m. may not be returned until the following business day.  We are closed weekends and major holidays. You have access to a nurse at all times for urgent questions. Please call the main number to the clinic Dept: [phone] and follow the prompts.   For any non-urgent questions, you may also contact your provider using MyChart. We now offer e-Visits for anyone 29 and older to request care online for non-urgent symptoms. For details visit [email or web address].   Also download the MyChart app! Go to the app store, search MyChart, open the app, select [place], and log in with your MyChart username and password.

## 2024-05-02 NOTE — Telephone Encounter (Signed)
 Left a detailed message for patient about upcoming brain MRI on 8/5.  Included information of date, time, location and the need for a driver if taking oral sedation for claustrophobia. Also requested pt return my call if he has questions or conflicts with this appointment.   Devere Perch R.T(R)(T) Radiation Special Procedures Lead

## 2024-05-03 ENCOUNTER — Ambulatory Visit
Admission: RE | Admit: 2024-05-03 | Discharge: 2024-05-03 | Disposition: A | Discharge status: Home / Self Care | Source: Ambulatory Visit | Attending: Radiation Oncology | Admitting: Radiation Oncology

## 2024-05-03 ENCOUNTER — Other Ambulatory Visit: Payer: Self-pay

## 2024-05-03 ENCOUNTER — Telehealth: Payer: Self-pay | Admitting: Medical Oncology

## 2024-05-03 ENCOUNTER — Encounter (HOSPITAL_COMMUNITY): Payer: Self-pay

## 2024-05-03 ENCOUNTER — Ambulatory Visit

## 2024-05-03 ENCOUNTER — Ambulatory Visit (HOSPITAL_COMMUNITY)
Admission: RE | Admit: 2024-05-03 | Discharge: 2024-05-03 | Disposition: A | Discharge status: Home / Self Care | Source: Ambulatory Visit | Attending: Acute Care | Admitting: Acute Care

## 2024-05-03 DIAGNOSIS — C7951 Secondary malignant neoplasm of bone: Secondary | ICD-10-CM | POA: Diagnosis not present

## 2024-05-03 DIAGNOSIS — K118 Other diseases of salivary glands: Secondary | ICD-10-CM | POA: Diagnosis not present

## 2024-05-03 DIAGNOSIS — C3411 Malignant neoplasm of upper lobe, right bronchus or lung: Secondary | ICD-10-CM | POA: Diagnosis not present

## 2024-05-03 DIAGNOSIS — Z87891 Personal history of nicotine dependence: Secondary | ICD-10-CM | POA: Diagnosis not present

## 2024-05-03 DIAGNOSIS — Z510 Encounter for antineoplastic radiation therapy: Secondary | ICD-10-CM | POA: Diagnosis not present

## 2024-05-03 LAB — RAD ONC ARIA SESSION SUMMARY
Course Elapsed Days: 14
Plan Fractions Treated to Date: 10
Plan Fractions Treated to Date: 10
Plan Prescribed Dose Per Fraction: 3 Gy
Plan Prescribed Dose Per Fraction: 3 Gy
Plan Total Fractions Prescribed: 10
Plan Total Fractions Prescribed: 10
Plan Total Prescribed Dose: 30 Gy
Plan Total Prescribed Dose: 30 Gy
Reference Point Dosage Given to Date: 30 Gy
Reference Point Dosage Given to Date: 30 Gy
Reference Point Session Dosage Given: 3 Gy
Reference Point Session Dosage Given: 3 Gy
Session Number: 10

## 2024-05-03 LAB — T4: T4, Total: 7.9 ug/dL (ref 4.5–12.0)

## 2024-05-03 MED ORDER — LIDOCAINE HCL (PF) 1 % IJ SOLN
10.0000 mL | Freq: Once | INTRAMUSCULAR | Status: AC
  Administered 2024-05-03: 10 mL via INTRADERMAL

## 2024-05-03 NOTE — Telephone Encounter (Signed)
 Pt called to notify provider that he woke up sweating this am . He denies any other side effects.  I encouraged him to drink extra fluids today -at least 60 oz. And to call for any other concerns.

## 2024-05-03 NOTE — Telephone Encounter (Signed)
 CAP referral application completed and efaxed to [facility]

## 2024-05-04 ENCOUNTER — Ambulatory Visit

## 2024-05-04 ENCOUNTER — Inpatient Hospital Stay: Admitting: Licensed Clinical Social Worker

## 2024-05-04 ENCOUNTER — Telehealth: Payer: Self-pay | Admitting: Internal Medicine

## 2024-05-04 DIAGNOSIS — C3491 Malignant neoplasm of unspecified part of right bronchus or lung: Secondary | ICD-10-CM

## 2024-05-04 LAB — CYTOLOGY - NON PAP

## 2024-05-04 NOTE — Telephone Encounter (Signed)
Scheduled appointments with the patient

## 2024-05-04 NOTE — Radiation Completion Notes (Signed)
  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  Intent: Palliative     ==========DELIVERED PLANS==========  First Treatment Date: 2024-04-19 Last Treatment Date: 2024-05-03   Plan Name: Lung_R Site: Lung, Right Technique: 3D Mode: Photon Dose Per Fraction: 3 Gy Prescribed Dose (Delivered / Prescribed): 30 Gy / 30 Gy Prescribed Fxs (Delivered / Prescribed): 10 / 10   Plan Name: Ext_L_Humerus Site: Humerus, Left Technique: Isodose Plan Mode: Photon Dose Per Fraction: 3 Gy Prescribed Dose (Delivered / Prescribed): 30 Gy / 30 Gy Prescribed Fxs (Delivered / Prescribed): 10 / 10     ==========ON TREATMENT VISIT DATES========== 2024-04-20, 2024-04-26    See weekly On Treatment Notes in Epic for details in the Media tab (listed as Progress notes on the On Treatment Visit Dates listed above). The patient tolerated radiation.    The patient will receive a call in about one month from the radiation oncology department. He will continue follow up with Dr. Sherrod as well. He will have a repeat MRI in about 4 weeks to follow up on a pineal finding seen on a prior MRI.      Donald KYM Husband, PAC

## 2024-05-04 NOTE — Progress Notes (Signed)
 [facility] Clinical Social Work  Initial Assessment   Benjamin Herrera is a 58 y.o. year old male contacted by phone. Clinical Social Work was referred by medical provider for assessment of psychosocial needs.   SDOH (Social Determinants of Health) assessments performed: Yes SDOH Interventions    Flowsheet Row Office Visit from 11/08/2023 in [facility] [place] - A Dept Of [place] Office Visit from 01/21/2022 in [facility] - A Dept Of [place]. [facility] Hights  SDOH Interventions    Food Insecurity Interventions AMB Referral --  Housing Interventions Intervention Not Indicated --  Transportation Interventions Intervention Not Indicated --  Utilities Interventions Intervention Not Indicated --  Alcohol Usage Interventions Intervention Not Indicated (Score <7) --  Depression Interventions/Treatment  Currently on Treatment Patient refuses Treatment  Financial Strain Interventions Intervention Not Indicated --  Stress Interventions Intervention Not Indicated --  Social Connections Interventions Intervention Not Indicated --  Health Literacy Interventions Intervention Not Indicated --    SDOH Screenings   Food Insecurity: No Food Insecurity (04/16/2024)  Housing: Unknown (04/16/2024)  Transportation Needs: No Transportation Needs (04/16/2024)  Utilities: Not At Risk (11/08/2023)  Alcohol Screen: Low Risk  (11/08/2023)  Depression (PHQ2-9): Low Risk  (05/02/2024)  Recent Concern: Depression (PHQ2-9) - High Risk (04/09/2024)  Financial Resource Strain: Low Risk  (11/08/2023)  Physical Activity: Inactive (11/08/2023)  Social Connections: Socially Isolated (11/08/2023)  Stress: No Stress Concern Present (11/08/2023)  Tobacco Use: High Risk (04/26/2024)   Received from [facility]  Health Literacy: Adequate Health Literacy (11/08/2023)     Distress Screen completed: Yes    05/01/2024    3:43 PM  ONCBCN DISTRESS SCREENING  Screening Type Initial  Screening  How much distress have you been experiencing in the past week? (0-10) 5  Emotional concerns type Worry or anxiety;Sadness or depression;Loss of interest or enjoyment;Fear  Physical Concerns Type  Pain;Sleep;Fatigue      Family/Social Information:  Housing Arrangement: patient lives alone Family members/support persons in your life? Pt reports he has family and friends that check in on  him daily Transportation concerns: no, pt reports he has friends who are bringing him to the cancer center for appts.  Employment: Unemployed pt states he has not worked for a number of years.  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  Pt informed of clinics available and how to schedule an appointment to complete documents. Services Currently in place:  none  Coping/ Adjustment to diagnosis: Patient understands treatment plan and what happens next? yes Concerns about diagnosis and/or treatment: Overwhelmed by information and Quality of life Patient reported stressors: Finances and Adjusting to my illness Hopes and/or priorities: pt's priority is to continue treatment w/ the hope of positive results Patient enjoys not discussed Current coping skills/ strengths: Capable of independent living , Motivation for treatment/growth , Physical Health , and Supportive family/friends     SUMMARY: Current SDOH Barriers:  Financial constraints related to limited income  Clinical Social Work Clinical Goal(s):  Explore community resource options for unmet needs related to:  Financial Strain   Interventions: Discussed common feeling and emotions when being diagnosed with cancer, and the importance of support during  treatment Informed patient of the support team roles  and support services at [facility] Provided CSW contact information and encouraged patient to call with any questions or concerns Discussed the [employer].  Pt meets presumptive eligibility requirement w/ food stamp award.  Pt provided w/ contact information for pt financial resource specialist to apply.  Pt informed of advanced directives clinic to complete forms.   Follow Up Plan: Patient will contact CSW with any support or resource needs Patient verbalizes understanding of plan: Yes    Devere JONELLE Manna, LCSW Clinical Social Worker [facility]  Patient is participating in a Managed Medicaid Plan:  Yes

## 2024-05-07 ENCOUNTER — Ambulatory Visit

## 2024-05-07 LAB — SURGICAL PATHOLOGY

## 2024-05-08 ENCOUNTER — Ambulatory Visit

## 2024-05-09 ENCOUNTER — Ambulatory Visit: Admitting: Infectious Diseases

## 2024-05-09 ENCOUNTER — Other Ambulatory Visit: Payer: Self-pay | Admitting: Infectious Diseases

## 2024-05-09 ENCOUNTER — Ambulatory Visit

## 2024-05-09 ENCOUNTER — Encounter: Payer: Self-pay | Admitting: Infectious Diseases

## 2024-05-09 ENCOUNTER — Telehealth: Payer: Self-pay | Admitting: Family Medicine

## 2024-05-09 VITALS — BP 108/69 | HR 118 | Temp 98.1°F | Ht 72.0 in | Wt 188.8 lb

## 2024-05-09 DIAGNOSIS — R1319 Other dysphagia: Secondary | ICD-10-CM

## 2024-05-09 DIAGNOSIS — B20 Human immunodeficiency virus [HIV] disease: Secondary | ICD-10-CM

## 2024-05-09 DIAGNOSIS — C3491 Malignant neoplasm of unspecified part of right bronchus or lung: Secondary | ICD-10-CM

## 2024-05-09 DIAGNOSIS — N3941 Urge incontinence: Secondary | ICD-10-CM | POA: Diagnosis not present

## 2024-05-09 DIAGNOSIS — C3490 Malignant neoplasm of unspecified part of unspecified bronchus or lung: Secondary | ICD-10-CM | POA: Diagnosis not present

## 2024-05-09 MED ORDER — HYDROXYZINE HCL 25 MG PO TABS
25.0000 mg | ORAL_TABLET | Freq: Three times a day (TID) | ORAL | 0 refills | Status: AC | PRN
Start: 2024-05-09 — End: ?

## 2024-05-09 MED ORDER — BIKTARVY 30-120-15 MG PO TABS: 1.0000 | ORAL_TABLET | Freq: Every day | ORAL | 3 refills | Status: AC

## 2024-05-09 MED ORDER — NYSTATIN 100000 UNIT/ML MT SUSP: 5.0000 mL | Freq: Three times a day (TID) | OROMUCOSAL | 2 refills | Status: DC | PRN

## 2024-05-09 MED ORDER — LIDOCAINE VISCOUS HCL 2 % MT SOLN: 5.0000 mL | Freq: Three times a day (TID) | OROMUCOSAL | 1 refills | Status: DC | PRN

## 2024-05-09 NOTE — Addendum Note (Signed)
 Addended by: ANTONE DWAYNE SAILOR on: 05/09/2024 03:45 PM   Modules accepted: Orders

## 2024-05-09 NOTE — Progress Notes (Signed)
 Subjective:    Patient ID: Benjamin Herrera, male  DOB: 11-11-1965, 58 y.o.        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 R shoulder pain (previous shoulder prosthesis).  He still has pain from this and has been told they will not remove. 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 Was taking TRV/ISN, had DRVr added at his visit 06-09-12. Then changed to tivicay  and complera. At his f/u 09-2015 was changed to genvoya /darunavir  due to need for ppi.  He underwent stress test (positive) then cath and PTCA of OM1 (09-2016). 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 He was started on beta-blocker and crestor  at his CV f/u (01-21-17).   Was seen in ED 10-29-21 for accidental drug o/d after being found by bystanders. He thought this was due to cocaine.    He was seen for shoulder pain and found to have a pathologic lesion. He was then found to have stage 4 adenoCa (03-2024) of his R lung. He has started CTX, XRT.   Today feels like there is something stuck in his throat and his arm is itching like crazy. Last CTX last week, he is concerned this is a side effect.  Has not seen any white lesions in his throat.  Had GI appt because of n/v, wt loss (40# 6-7 months) and dysphagia- 10-04-23. Has manometry scheduled this month. He continues to lose wt. (239# 2023). Has been taking ART (biktarvy ), states he needs a refill.   HIV 1 RNA Quant  Date Value  04/07/2021 27 Copies/mL (H)  07/30/2020 6,280 Copies/mL (H)  11/07/2019 366 copies/mL (H)   CD4 T Cell Abs (/uL)  Date Value  04/07/2021 318 (L)  07/30/2020 291 (L)  11/07/2019 465     Health Maintenance  Topic Date Due  . DTaP/Tdap/Td (1 - Tdap) Never done  . COVID-19 Vaccine (3 - Pfizer risk series) 07/23/2020  . Zoster Vaccines- Shingrix (1 of 2) 07/10/2024 (Originally 05/20/1985)  . Pneumococcal Vaccine 31-27 Years old (3 of 3 - PCV) 04/02/2025 (Originally 10/03/2012)  . INFLUENZA VACCINE  05/25/2024   . Colonoscopy  01/31/2031  . Hepatitis B Vaccines  Completed  . Hepatitis C Screening  Completed  . HIV Screening  Completed  . HPV VACCINES  Aged Out  . Meningococcal B Vaccine  Aged Out  . Lung Cancer Screening  Discontinued      Review of Systems  Constitutional:  Positive for weight loss. Negative for chills and fever.  Respiratory:  Negative for cough, hemoptysis and shortness of breath.   Gastrointestinal:  Negative for constipation and diarrhea.  Genitourinary:  Negative for dysuria.  Skin:  Positive for itching.    Please see HPI. All other systems reviewed and negative.     Objective:  Physical Exam Vitals reviewed.  Constitutional:      Appearance: Normal appearance.  HENT:     Mouth/Throat:     Mouth: Mucous membranes are moist.     Pharynx: No oropharyngeal exudate or posterior oropharyngeal erythema.  Eyes:     Extraocular Movements: Extraocular movements intact.     Pupils: Pupils are equal, round, and reactive to light.  Cardiovascular:     Rate and Rhythm: Normal rate and regular rhythm.  Pulmonary:     Effort: Pulmonary effort is normal.     Breath sounds: Normal breath sounds.  Abdominal:     General: Bowel sounds are normal. There  is no distension.     Palpations: Abdomen is soft.  Musculoskeletal:        General: Normal range of motion.     Cervical back: Normal range of motion and neck supple.     Right lower leg: No edema.     Left lower leg: No edema.  Skin:        Comments: Dry and darkened skin. Query if due to radiation.   Neurological:     Mental Status: He is alert.           Assessment & Plan:

## 2024-05-09 NOTE — Assessment & Plan Note (Signed)
 Apprecaite Heme/Onc, Radiation, pulmonary following.  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  Will give him atarax  to help with his itching. Cautioned him about sedation.

## 2024-05-09 NOTE — Telephone Encounter (Signed)
 Copied from CRM [id number]. Topic: Clinical - Prescription Issue >> May 09, 2024  4:28 PM Mercer PEDLAR wrote: Reason for CRM: Patient called stating he was informed by [facility] that they don't have magic mouthwash (lidocaine , diphenhydrAMINE , alum & mag hydroxide) suspension and that it would need to be sent to a pharmacy that makes the compound medication. Please contact patient to let him know if there are any alternatives or if there are other pharmacies it can be sent to.

## 2024-05-09 NOTE — Assessment & Plan Note (Addendum)
(  T3, N3, M1 C) non-small cell lung cancer, adenocarcinoma presented with large right upper lobe lung mass in addition to right hilar and mediastinal as well as right supraclavicular lymphadenopathy and metastatic bone lesions including the left proximal humerus, L2 as well as sacral area and left adrenal metastasis diagnosed in June 2025. His molecular studies show no actionable mutations. He has high TMB. 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 I will check with Dr. Sherrod next week if he would adjust his immunotherapy drugs with this information starting from cycle #2. His PDL1 expression is 0 He completed palliative radiation on 05/03/2024. First dose chemotherapy given 05/02/2024.  Significant neutropenia and thrombocytopenia have developed following initial dose chemotherapy.  He is to receive 3 consecutive doses of growth factor support starting with today's visit.

## 2024-05-09 NOTE — Assessment & Plan Note (Signed)
 He has f/u with GI this month Will see if magic mouthwash helps in the interim.

## 2024-05-09 NOTE — Progress Notes (Signed)
 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 His molecular studies show no actionable mutations. He has high TMB. 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 I will check with Dr. Sherrod next week if he would adjust his immunotherapy drugs with this information starting from cycle #2. His PDL1 expression is 0 He completed palliative radiation on 05/03/2024. First dose chemotherapy given 05/02/2024.  Significant neutropenia and thrombocytopenia have developed following initial dose chemotherapy.  He is to receive 3 consecutive doses of growth factor support starting with today's visit.        Open wound in left axilla Patient reports left axillary region being extremely itchy.  Has scratched some but she has opened wound and posterior left axillary region.  Very painful to touch.  Area is red and warm without evidence of drainage.  Patient is now neutropenic.  Will add Augmentin  twice daily for next 10 days to prevent severe bacterial infection.  Left shoulder pain Due to bone metastases and recent radiation.  There is subcutaneous swelling and severe pain with light touch of left  shoulder.  Skin is very warm.  Redness is noted.  Will renew pain medication prescribed.  Recommend using OTC aloe containing lidocaine  to help additionally with the pain.  Neutropenia and thrombocytopenia Chemotherapy related.  WBC 0.9 with ANC of 0.5.  Platelets now 95.  Patient to receive growth injection.  Support today, tomorrow, and Saturday morning.  First dose to be given prior to discharge from clinic.  Advised he start taking Claritin (loratadine) 10 mg daily starting today to help with bone pain which may start as result of growth factor injection.  He will be given copy of new schedule by LPN, Ashleigh.  Plan Labs reviewed. - CBC showing neutropenia and thrombocytopenia. --Patient to receive growth factor support today, tomorrow, and Saturday. -Antibiotic, Augmentin , sent to pharmacy to prevent infection to open wound and left axillary region. Reviewed current prescription for oxycodone  as needed. Labs/flush, follow-up, and cycle 2 chemotherapy with carboplatin , Alimta , and Libtayo as scheduled.   Strict callback instructions were given to patient and his sister.  Both voiced understanding and agreement.    The patient understands the plans discussed today and is in agreement with them.  He knows to contact our office if he develops concerns prior to his next appointment.  I provided 30 minutes of face-to-face time during this encounter and > 50% was spent counseling as documented under my assessment and plan.    Benjamin FORBES Lessen, NP  [facility] - A DEPT OF [facility] [address] [place] [address] Dept: [phone] Dept Fax: [phone]   No orders of the defined types were placed in this  encounter.     CHIEF COMPLAINT:  CC: Stage IV adenocarcinoma of right lung  Current Treatment: Systemic chemotherapy with carboplatin  for AUC of 5, Alimta  500 mg/m, and Libtayo IV every 3 weeks.  First dose on  05/02/2024.  INTERVAL HISTORY:  Benjamin Herrera is here today for repeat clinical assessment.  He was last seen by Cassie, PA on 05/02/2024.  Initial dose chemotherapy given on that day.  Today, he presents for toxicity check.  He completed radiation on 05/03/2024.  Having severe pain in the left shoulder with burning and itching in left axillary region.  Patient sister has noticed open sore on the posterior right axillary region.  Patient has also noted some dry and sore throat recently.  He denies chest pain, chest pressure, or shortness of breath. He denies headaches or visual disturbances. He denies abdominal pain, nausea, vomiting, or changes in bowel or bladder habits.   He denies fevers or chills. His appetite is fair. His weight .has decreased 9 pounds over the past week.  I have reviewed the past medical history, past surgical history, social history and family history with the patient and they are unchanged from previous note.  ALLERGIES:  is allergic to citrus.  MEDICATIONS:  Current Outpatient Medications  Medication Sig Dispense Refill   acetaminophen  (TYLENOL ) 325 MG tablet Take 650 mg by mouth every 6 (six) hours as needed for mild pain (pain score 1-3), moderate pain (pain score 4-6) or headache.     albuterol  (VENTOLIN  HFA) 108 (90 Base) MCG/ACT inhaler Inhale 2 puffs into the lungs every 6 (six) hours as needed for wheezing or shortness of breath. 1 each 2   amLODipine  (NORVASC ) 5 MG tablet TAKE 1 TABLET (5 MG TOTAL) BY MOUTH DAILY. 30 tablet 0   amoxicillin -clavulanate (AUGMENTIN ) 875-125 MG tablet Take 1 tablet by mouth 2 (two) times daily. 20 tablet 0   aspirin  81 MG chewable tablet Chew 1 tablet (81 mg total) by mouth daily. 90 tablet 3   Bictegravir-Emtricitab-Tenofov (BIKTARVY ) 30-120-15 MG TABS Take 1 tablet by mouth daily. 90 tablet 3   Blood Pressure KIT 1 kit by Does not apply route daily. 1 kit 0   diclofenac  Sodium (VOLTAREN  ARTHRITIS PAIN) 1 % GEL Apply 2 g topically 4 (four) times  daily. 100 g 3   folic acid  (FOLVITE ) 1 MG tablet Take 1 tablet (1 mg total) by mouth daily. Start 7 days before pemetrexed  chemotherapy. Continue until 21 days after pemetrexed  completed. 100 tablet 3   hydrOXYzine  (ATARAX ) 25 MG tablet Take 1 tablet (25 mg total) by mouth 3 (three) times daily as needed. 30 tablet 0   lidocaine  (XYLOCAINE ) 2 % solution SWISH AND SWALLOW 5 MLS 3 (THREE) TIMES DAILY AS NEEDED FOR MOUTH PAIN. 360 mL 1   lidocaine -prilocaine  (EMLA ) cream Apply to affected area once 30 g 3   LORazepam  (ATIVAN ) 0.5 MG tablet Take 1 tablet (0.5 mg total) by mouth as needed for anxiety. Take 30 min prior to scan and have a ride arranged from the scan. 1 tablet 0   magic mouthwash (nystatin , lidocaine , diphenhydrAMINE , alum & mag hydroxide) suspension Swish and swallow 5 mLs 3 (three) times daily as needed for mouth pain. 180 mL 2   methocarbamol  (ROBAXIN ) 500 MG tablet Take 2 tablets (1,000 mg total) by mouth every 8 (eight) hours as needed. 90 tablet 1   metoprolol  succinate (TOPROL -XL) 25 MG 24 hr tablet TAKE 1/2 TABLET BY MOUTH DAILY 45 tablet 0   nitroGLYCERIN  (NITROSTAT )  0.4 MG SL tablet Place 1 tablet (0.4 mg total) under the tongue every 5 (five) minutes as needed for chest pain (trouble swallowing). 25 tablet 1   ondansetron  (ZOFRAN ) 8 MG tablet Take 1 tablet (8 mg total) by mouth every 8 (eight) hours as needed for nausea or vomiting. Start on the third day after carboplatin . 30 tablet 1   prochlorperazine  (COMPAZINE ) 10 MG tablet Take 1 tablet (10 mg total) by mouth every 6 (six) hours as needed for nausea or vomiting. 30 tablet 1   oxyCODONE -acetaminophen  (PERCOCET/ROXICET) 5-325 MG tablet Take 1 tablet by mouth every 8 (eight) hours as needed for severe pain (pain score 7-10). 30 tablet 0   No current facility-administered medications for this visit.    HISTORY OF PRESENT ILLNESS:   Oncology History  Adenocarcinoma of right lung, stage 4 (HCC)  04/16/2024 Initial Diagnosis    Adenocarcinoma of right lung, stage 4 (HCC)   05/02/2024 -  Chemotherapy   Patient is on Treatment Plan : LUNG Carboplatin  (5) + Pemetrexed  (500) + Libtayo D1 q21d Induction x 4 cycles / Maintenance Pemetrexed  (500) + Pembrolizumab (200) D1 q21d         REVIEW OF SYSTEMS:   Constitutional: Denies fevers or chills. Has had decreased appetite. Weight down 9 pounds since starting on chemotherapy 05/02/2024.  Eyes: Denies blurriness of vision Ears, nose, mouth, throat, and face: Denies mucositis or sore throat Respiratory: Denies cough, dyspnea or wheezes Cardiovascular: Denies palpitation, chest discomfort or lower extremity swelling Gastrointestinal:  Denies nausea, heartburn or change in bowel habits Skin: Severe burning and itching of the skin on left shoulder and left axillary region.  Sister has noted open for along the posterior aspect of left axillary region. Lymphatics: Denies new lymphadenopathy or easy bruising Neurological:Denies numbness, tingling or new weaknesses Behavioral/Psych: Mood is stable, no new changes  All other systems were reviewed with the patient and are negative.   VITALS:   Today's Vitals   05/10/24 1327 05/10/24 1339  BP: 130/70   Pulse: (!) 114   Resp: 17   Temp: 97.8 F (36.6 C)   Weight: 189 lb (85.7 kg)   PainSc:  6    Body mass index is 25.63 kg/m.   Wt Readings from Last 3 Encounters:  05/10/24 189 lb (85.7 kg)  05/09/24 188 lb 12.8 oz (85.6 kg)  05/02/24 197 lb 11.2 oz (89.7 kg)    Body mass index is 25.63 kg/m.  Performance status (ECOG): 2 - Symptomatic, <50% confined to bed  PHYSICAL EXAM:   GENERAL:alert, no distress and comfortable SKIN: skin color, texture, turgor are normal, no rashes.  He has been sore in the posterior aspect of left axilla.  Skin is very warm and red.  No evidence of bleeding or drainage present. EYES: normal, Conjunctiva are pink and non-injected, sclera clear OROPHARYNX:no exudate, no erythema and lips,  buccal mucosa, and tongue normal  NECK: supple, thyroid normal size, non-tender, without nodularity LYMPH:  no palpable lymphadenopathy in the cervical, axillary or inguinal LUNGS: clear to auscultation and percussion with normal breathing effort HEART: regular rate & rhythm and no murmurs and no lower extremity edema ABDOMEN:abdomen soft, non-tender and normal bowel sounds Musculoskeletal:no cyanosis of digits and no clubbing.  He has severe pain in the left shoulder and upper arm.  Light touch and passive motion elicit grimacing.  Left shoulder is very swollen, red, and hot to touch. NEURO: alert & oriented x 3 with fluent speech, no focal motor/sensory  deficits  LABORATORY DATA:  I have reviewed the data as listed    Component Value Date/Time   NA 132 (L) 05/10/2024 1254   NA 139 01/05/2024 1421   K 4.0 05/10/2024 1254   CL 103 05/10/2024 1254   CO2 22 05/10/2024 1254   GLUCOSE 124 (H) 05/10/2024 1254   BUN 30 (H) 05/10/2024 1254   BUN 16 01/05/2024 1421   CREATININE 1.16 05/10/2024 1254   CREATININE 0.96 07/30/2020 1437   CALCIUM  7.8 (L) 05/10/2024 1254   PROT 7.9 05/10/2024 1254   PROT 7.1 03/23/2023 1617   ALBUMIN 3.9 05/10/2024 1254   ALBUMIN 4.2 03/23/2023 1617   AST 23 05/10/2024 1254   ALT 23 05/10/2024 1254   ALKPHOS 74 05/10/2024 1254   BILITOT 0.6 05/10/2024 1254   GFRNONAA >60 05/10/2024 1254   GFRNONAA 74 10/28/2016 1040   GFRAA >60 05/02/2017 0754   GFRAA 85 10/28/2016 1040    Lab Results  Component Value Date   WBC 0.9 (LL) 05/10/2024   NEUTROABS 0.5 (L) 05/10/2024   HGB 14.4 05/10/2024   HCT 42.5 05/10/2024   MCV 74.3 (L) 05/10/2024   PLT 95 (L) 05/10/2024    RADIOGRAPHIC STUDIES: US  FNA SALIVARY GLAND/PAROTID GLAND Result Date: 05/03/2024 CLINICAL DATA:  Mass of the right parotid gland EXAM: US  parotid gland FNA and CORE BIOPSY RIGHT COMPARISON:  Previous exam(s). PROCEDURE: I met with the patient and we discussed the procedure of ultrasound-guided  biopsy, including benefits and alternatives. We discussed the high likelihood of a successful procedure. We discussed the risks of the procedure, including infection, bleeding, tissue injury, and inadequate sampling. Informed written consent was given. The usual time-out protocol was performed immediately prior to the procedure. Using sterile technique and 1% Lidocaine  as local anesthetic, under direct ultrasound visualization, a 14 gauge spring-loaded device was used to perform biopsy of the right parotid mass using a medial approach. Fine-needle aspiration was also performed by advancing the needle from the skin into the midpoint of the mass under ultrasound guidance. Each needle was advanced and aspirated with a 5 mL syringe and then given to cytopathology for processing. A total of 5 fine needle aspirations were performed. IMPRESSION: Ultrasound guided fine needle aspiration and core needle biopsy of the right parotid gland mass. Electronically Signed   By: Cordella Banner   On: 05/03/2024 15:42

## 2024-05-09 NOTE — Addendum Note (Signed)
 Addended by: Scharlene Catalina C on: 05/09/2024 04:39 PM   Modules accepted: Orders

## 2024-05-09 NOTE — Assessment & Plan Note (Signed)
 Will get his CD4 and HIV RNA today Refill his biktarvy .  Will see him back in 1 month.

## 2024-05-10 ENCOUNTER — Telehealth: Payer: Self-pay | Admitting: Family Medicine

## 2024-05-10 ENCOUNTER — Inpatient Hospital Stay (HOSPITAL_BASED_OUTPATIENT_CLINIC_OR_DEPARTMENT_OTHER): Admitting: Nurse Practitioner

## 2024-05-10 ENCOUNTER — Other Ambulatory Visit: Payer: Self-pay | Admitting: Physician Assistant

## 2024-05-10 ENCOUNTER — Other Ambulatory Visit: Payer: Self-pay | Admitting: Medical Oncology

## 2024-05-10 ENCOUNTER — Ambulatory Visit

## 2024-05-10 ENCOUNTER — Telehealth: Payer: Self-pay | Admitting: Medical Oncology

## 2024-05-10 ENCOUNTER — Ambulatory Visit: Payer: Self-pay | Admitting: Infectious Diseases

## 2024-05-10 ENCOUNTER — Inpatient Hospital Stay

## 2024-05-10 VITALS — BP 130/70 | HR 114 | Temp 97.8°F | Resp 17 | Wt 189.0 lb

## 2024-05-10 DIAGNOSIS — C3491 Malignant neoplasm of unspecified part of right bronchus or lung: Secondary | ICD-10-CM | POA: Diagnosis not present

## 2024-05-10 DIAGNOSIS — C7951 Secondary malignant neoplasm of bone: Secondary | ICD-10-CM | POA: Diagnosis not present

## 2024-05-10 DIAGNOSIS — C3411 Malignant neoplasm of upper lobe, right bronchus or lung: Secondary | ICD-10-CM | POA: Diagnosis not present

## 2024-05-10 DIAGNOSIS — Z510 Encounter for antineoplastic radiation therapy: Secondary | ICD-10-CM | POA: Diagnosis not present

## 2024-05-10 LAB — CBC WITH DIFFERENTIAL (CANCER CENTER ONLY)
Abs Immature Granulocytes: 0.01 K/uL (ref 0.00–0.07)
Basophils Absolute: 0 K/uL (ref 0.0–0.1)
Basophils Relative: 1 %
Eosinophils Absolute: 0.1 K/uL (ref 0.0–0.5)
Eosinophils Relative: 12 %
HCT: 42.5 % (ref 39.0–52.0)
Hemoglobin: 14.4 g/dL (ref 13.0–17.0)
Immature Granulocytes: 1 %
Lymphocytes Relative: 22 %
Lymphs Abs: 0.2 K/uL — ABNORMAL LOW (ref 0.7–4.0)
MCH: 25.2 pg — ABNORMAL LOW (ref 26.0–34.0)
MCHC: 33.9 g/dL (ref 30.0–36.0)
MCV: 74.3 fL — ABNORMAL LOW (ref 80.0–100.0)
Monocytes Absolute: 0 K/uL — ABNORMAL LOW (ref 0.1–1.0)
Monocytes Relative: 3 %
Neutro Abs: 0.5 K/uL — ABNORMAL LOW (ref 1.7–7.7)
Neutrophils Relative %: 61 %
Platelet Count: 95 K/uL — ABNORMAL LOW (ref 150–400)
RBC: 5.72 MIL/uL (ref 4.22–5.81)
RDW: 14.3 % (ref 11.5–15.5)
Smear Review: NORMAL
WBC Count: 0.9 K/uL — CL (ref 4.0–10.5)
nRBC: 0 % (ref 0.0–0.2)

## 2024-05-10 LAB — T-HELPER CELLS (CD4) COUNT (NOT AT ARMC)
CD4 % Helper T Cell: 16 % — ABNORMAL LOW (ref 33–65)
CD4 T Cell Abs: <35 /uL — ABNORMAL LOW (ref 400–1790)

## 2024-05-10 LAB — CMP (CANCER CENTER ONLY)
ALT: 23 U/L (ref 0–44)
AST: 23 U/L (ref 15–41)
Albumin: 3.9 g/dL (ref 3.5–5.0)
Alkaline Phosphatase: 74 U/L (ref 38–126)
Anion gap: 7 (ref 5–15)
BUN: 30 mg/dL — ABNORMAL HIGH (ref 6–20)
CO2: 22 mmol/L (ref 22–32)
Calcium: 7.8 mg/dL — ABNORMAL LOW (ref 8.9–10.3)
Chloride: 103 mmol/L (ref 98–111)
Creatinine: 1.16 mg/dL (ref 0.61–1.24)
GFR, Estimated: >60 mL/min (ref >60)
Glucose, Bld: 124 mg/dL — ABNORMAL HIGH (ref 70–99)
Potassium: 4 mmol/L (ref 3.5–5.1)
Sodium: 132 mmol/L — ABNORMAL LOW (ref 135–145)
Total Bilirubin: 0.6 mg/dL (ref 0.0–1.2)
Total Protein: 7.9 g/dL (ref 6.5–8.1)

## 2024-05-10 LAB — HIV-1 RNA QUANT-NO REFLEX-BLD
HIV-1 RNA Viral Load Log: 3.369 {Log_copies}/mL
HIV-1 RNA Viral Load: 2340 {copies}/mL

## 2024-05-10 MED ORDER — AMOXICILLIN-POT CLAVULANATE 875-125 MG PO TABS: 1.0000 | ORAL_TABLET | Freq: Two times a day (BID) | ORAL | 0 refills | Status: AC

## 2024-05-10 MED ORDER — FILGRASTIM-SNDZ 480 MCG/0.8ML IJ SOSY
480.0000 ug | PREFILLED_SYRINGE | Freq: Once | INTRAMUSCULAR | Status: AC
  Administered 2024-05-10: 480 ug via SUBCUTANEOUS
  Filled 2024-05-10: qty 0.8

## 2024-05-10 MED ORDER — OXYCODONE-ACETAMINOPHEN 5-325 MG PO TABS
1.0000 | ORAL_TABLET | Freq: Three times a day (TID) | ORAL | 0 refills | Status: DC | PRN
Start: 2024-05-10 — End: 2024-05-17

## 2024-05-10 NOTE — Telephone Encounter (Signed)
 CRITICAL VALUE STICKER  CRITICAL VALUE:wbc=0.9  RECEIVER (on-site recipient of call):Ithiel Liebler, RN  DATE & TIME NOTIFIED: 05/10/2024 @ 1342  MESSENGER (representative from lab):Jessica  MD NOTIFIED: Heilingoetter, PA-C and Powell Lessen, NP  TIME OF NOTIFICATION: 1344  RESPONSE:  Pt is being seen today by NP.

## 2024-05-10 NOTE — Telephone Encounter (Signed)
 Dr.Hatcher  refilled prescription, patient was seen yesterday

## 2024-05-10 NOTE — Telephone Encounter (Signed)
 Hs prescription was from infectious disease, his HIV doctor.  Please have him contact them to send it to a compounding pharmacy .  Thank you.

## 2024-05-10 NOTE — Telephone Encounter (Signed)
 Copied from CRM [id number]. Topic: General - Call Back - No Documentation >> May 10, 2024 11:51 AM Sasha H wrote:  Reason for CRM: Pt is requesting a call back from social worker about CAP application. Please reach out to pt

## 2024-05-10 NOTE — Telephone Encounter (Signed)
 I called the patient and he said he received a letter from [employer] that they didn't receive his application.  I told him that it was sent on 05/03/24 and I will re-send it again today.  He stated he would also call them and share that information.   [employer] referral re-emailed to [facility] today

## 2024-05-11 ENCOUNTER — Inpatient Hospital Stay: Admitting: Licensed Clinical Social Worker

## 2024-05-11 ENCOUNTER — Telehealth: Payer: Self-pay | Admitting: Family Medicine

## 2024-05-11 ENCOUNTER — Inpatient Hospital Stay

## 2024-05-11 ENCOUNTER — Ambulatory Visit

## 2024-05-11 VITALS — BP 109/71 | HR 118 | Temp 99.0°F | Resp 18

## 2024-05-11 DIAGNOSIS — C3411 Malignant neoplasm of upper lobe, right bronchus or lung: Secondary | ICD-10-CM | POA: Diagnosis not present

## 2024-05-11 DIAGNOSIS — C3491 Malignant neoplasm of unspecified part of right bronchus or lung: Secondary | ICD-10-CM

## 2024-05-11 DIAGNOSIS — Z510 Encounter for antineoplastic radiation therapy: Secondary | ICD-10-CM | POA: Diagnosis not present

## 2024-05-11 DIAGNOSIS — C7951 Secondary malignant neoplasm of bone: Secondary | ICD-10-CM | POA: Diagnosis not present

## 2024-05-11 MED ORDER — FILGRASTIM-SNDZ 480 MCG/0.8ML IJ SOSY
480.0000 ug | PREFILLED_SYRINGE | Freq: Once | INTRAMUSCULAR | Status: AC
  Administered 2024-05-11: 480 ug via SUBCUTANEOUS
  Filled 2024-05-11: qty 0.8

## 2024-05-11 NOTE — Progress Notes (Signed)
 [facility] CSW Progress Note  Clinical Child psychotherapist contacted patient by phone to follow-up on referral received for supportive counseling and groups.    Interventions: CSW informed pt of the virtual lung cancer support group which meets virtually once a month.  Pt verbalized agreement w/ being added to the group to receive the link.  Pt stating that at this time he is coming to the cancer center too often to add another appointment and declined counseling services.  Pt has contact information for CSW should he wish to sign up for counseling at a later time.      Devere JONELLE Manna, LCSW Clinical Social Worker [facility]    Patient is participating in a Managed Medicaid Plan:  Yes

## 2024-05-11 NOTE — Telephone Encounter (Signed)
 Contacted pt sch appt Monday 1:30pm

## 2024-05-12 ENCOUNTER — Inpatient Hospital Stay

## 2024-05-12 VITALS — BP 107/74 | HR 108 | Temp 98.6°F | Resp 18

## 2024-05-12 DIAGNOSIS — C3491 Malignant neoplasm of unspecified part of right bronchus or lung: Secondary | ICD-10-CM

## 2024-05-12 DIAGNOSIS — C3411 Malignant neoplasm of upper lobe, right bronchus or lung: Secondary | ICD-10-CM | POA: Diagnosis not present

## 2024-05-12 DIAGNOSIS — C7951 Secondary malignant neoplasm of bone: Secondary | ICD-10-CM | POA: Diagnosis not present

## 2024-05-12 DIAGNOSIS — Z510 Encounter for antineoplastic radiation therapy: Secondary | ICD-10-CM | POA: Diagnosis not present

## 2024-05-12 MED ORDER — FILGRASTIM-SNDZ 480 MCG/0.8ML IJ SOSY
480.0000 ug | PREFILLED_SYRINGE | Freq: Once | INTRAMUSCULAR | Status: AC
  Administered 2024-05-12: 480 ug via SUBCUTANEOUS

## 2024-05-13 ENCOUNTER — Encounter: Payer: Self-pay | Admitting: Internal Medicine

## 2024-05-13 ENCOUNTER — Encounter: Payer: Self-pay | Admitting: Nurse Practitioner

## 2024-05-14 ENCOUNTER — Ambulatory Visit: Attending: Family Medicine | Admitting: Family Medicine

## 2024-05-14 ENCOUNTER — Encounter: Payer: Self-pay | Admitting: Internal Medicine

## 2024-05-14 ENCOUNTER — Ambulatory Visit

## 2024-05-14 ENCOUNTER — Telehealth (HOSPITAL_COMMUNITY): Payer: Self-pay

## 2024-05-14 DIAGNOSIS — R269 Unspecified abnormalities of gait and mobility: Secondary | ICD-10-CM

## 2024-05-14 DIAGNOSIS — N39498 Other specified urinary incontinence: Secondary | ICD-10-CM

## 2024-05-14 NOTE — Progress Notes (Signed)
 Returned call to patient from message left.  Introduced myself as Dance movement psychotherapist and to offer available resources. Discussed one-time $1000 [employer] grant and advised what is needed to apply. He will provide on 05/17/24 at next visit to registration to scan and email to me. He will then be given grant paperwork to complete and review and call me at earliest convenience to discuss in detail.  He has my card to do so and for any additional financial questions or concerns.

## 2024-05-14 NOTE — Telephone Encounter (Signed)
 Benjamin Herrera was scheduled for esophageal manometry with Dr. Shila on Wednesday May 16, 2024, at [facility].   Patient called on Monday May 14, 2024 to cancel their procedure due to other important health issues going on and has a different appointment for that day and will need to reschedule.  Dr Shila & office notified. Patient instructed to call physician's office to reschedule their procedure. Patient demonstrated understanding.

## 2024-05-14 NOTE — Progress Notes (Signed)
 Virtual Visit via Video Note  I connected with Benjamin Herrera, on 05/14/2024 at 3:03 PM by video enabled telemedicine device and verified that I am speaking with the correct person using two identifiers.   Consent: I discussed the limitations, risks, security and privacy concerns of performing an evaluation and management service by telemedicine and the availability of in person appointments. I also discussed with the patient that there may be a patient responsible charge related to this service. The patient expressed understanding and agreed to proceed.   Clinician has audio - video capabilities but patient was only able to operate/preferred audio.  Location of Patient: Home  Location of Provider: Clinic   Persons participating in Telemedicine visit: Benjamin Herrera Dr. Delbert    Discussed the use of AI scribe software for clinical note transcription with the patient, who gave verbal consent to proceed.  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  He experiences urinary incontinence that began after starting radiation therapy, with occasional accidents and leakage when coughing or sneezing. He uses supplies such as wipes, gloves, and briefs to manage this condition.  He experiences fatigue when walking long distances, requiring the use of a wheelchair.  He would like a rolling walker with a seat so he can sit after prolonged walking.  He has difficulty affording nutritional drinks like Boost, which he purchases himself. No pressure ulcers or bed sores are present, and he does not use oxygen.      Past Medical History:  Diagnosis Date   Achalasia and  cardiospasm    Allergy    Anxiety    Arthritis    back ,shoulders    Asthma    Boils    under arms hx of   CAD (coronary artery disease)    a. 09/2016: cath showing 100% RCA occlusion with collaterals and 90% OM1 (treated with DES)   Candidiasis of mouth    Candidiasis of unspecified site    Cholelithiasis    Cocaine abuse (HCC)    Quit 09/2016   Condyloma acuminatum in male of scrotum & anal canal s/p laser ablation 07/03/2012   Coronary artery disease    Dysphagia    Fracture, humerus    Frozen shoulder    left   GERD (gastroesophageal reflux disease)    Heart attack (HCC)    Hemorrhoids    Hepatitis 1993   A   Herpes labialis    HIV (human immunodeficiency virus infection) (HCC) DX 1993   Hypertension Dx 2014   off all bp meds for last 9 or 10 months   Pilonidal disease 01/10/2012   Polysubstance abuse (HCC)    Psoriasis    hx of   Sleep apnea    does  have CPAP   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     albuterol  (VENTOLIN  HFA) 108 (90 Base) MCG/ACT inhaler Inhale 2 puffs into the lungs every 6 (six) hours as needed for wheezing or shortness of breath. 1 each 2   amLODipine  (NORVASC ) 5 MG tablet TAKE 1 TABLET (5 MG TOTAL) BY MOUTH DAILY. 30 tablet 0   amoxicillin -clavulanate (AUGMENTIN ) 875-125 MG tablet Take 1 tablet by mouth 2 (two) times daily. 20 tablet 0   aspirin  81 MG chewable tablet Chew 1 tablet (81 mg total) by mouth daily. 90 tablet 3   Bictegravir-Emtricitab-Tenofov (BIKTARVY ) 30-120-15 MG TABS  Take 1 tablet by mouth daily. 90 tablet 3   Blood Pressure KIT 1 kit by Does not apply route daily. 1 kit 0   diclofenac  Sodium (VOLTAREN  ARTHRITIS PAIN) 1 % GEL Apply 2 g topically 4 (four) times daily. 100 g 3   folic acid  (FOLVITE ) 1 MG tablet Take 1 tablet (1 mg total) by mouth daily. Start 7 days before pemetrexed  chemotherapy. Continue until 21 days after pemetrexed  completed. 100 tablet 3   hydrOXYzine  (ATARAX ) 25 MG tablet Take 1 tablet (25 mg total) by mouth 3 (three) times daily as needed. 30 tablet 0   lidocaine  (XYLOCAINE ) 2 % solution SWISH AND SWALLOW 5 MLS 3 (THREE) TIMES DAILY AS NEEDED FOR MOUTH PAIN. 360 mL 1   lidocaine -prilocaine  (EMLA ) cream Apply to affected area once 30 g 3   LORazepam  (ATIVAN ) 0.5 MG tablet Take 1 tablet (0.5 mg total) by mouth as needed for anxiety. Take 30 min prior to scan and have a ride arranged from the scan. 1 tablet 0   magic mouthwash (nystatin , lidocaine , diphenhydrAMINE , alum & mag hydroxide) suspension Swish and swallow 5 mLs 3 (three) times daily as needed for mouth pain. 180 mL 2   methocarbamol  (ROBAXIN ) 500 MG tablet Take 2 tablets (1,000 mg total) by mouth every 8 (eight) hours as needed. 90 tablet 1   metoprolol  succinate (TOPROL -XL) 25 MG 24 hr tablet TAKE 1/2 TABLET BY MOUTH DAILY 45 tablet 0   nitroGLYCERIN  (NITROSTAT ) 0.4 MG SL tablet Place 1 tablet (0.4 mg total) under the tongue every 5 (five) minutes as needed for chest pain (trouble swallowing). 25 tablet 1   ondansetron  (ZOFRAN ) 8 MG tablet Take 1 tablet (8 mg total) by mouth every 8 (eight) hours as needed for nausea or vomiting. Start on the third day after carboplatin . 30 tablet 1   oxyCODONE -acetaminophen  (PERCOCET/ROXICET) 5-325 MG tablet Take 1 tablet by mouth every 8 (eight) hours as needed for severe pain (pain score 7-10). 30 tablet 0   prochlorperazine  (COMPAZINE ) 10 MG tablet Take 1 tablet (10 mg total) by mouth every 6 (six) hours as needed for nausea or vomiting. 30 tablet  1   No current facility-administered medications on file prior to visit.    ROS: See HPI  Observations/Objective: Awake, alert, oriented x3 Not in acute distress Normal mood      Latest Ref Rng & Units 05/10/2024   12:54 PM 05/02/2024    1:27 PM 04/16/2024    1:43 PM  CMP  Glucose 70 - 99 mg/dL 875  899  84   BUN 6 - 20 mg/dL 30  10  11    Creatinine 0.61 - 1.24 mg/dL 8.83  9.24  9.07   Sodium 135 - 145 mmol/L 132  140  141   Potassium 3.5 - 5.1 mmol/L  4.0  3.4  3.7   Chloride 98 - 111 mmol/L 103  105  109   CO2 22 - 32 mmol/L 22  29  26    Calcium  8.9 - 10.3 mg/dL 7.8  9.7  9.5   Total Protein 6.5 - 8.1 g/dL 7.9  7.4  7.6   Total Bilirubin 0.0 - 1.2 mg/dL 0.6  0.5  0.4   Alkaline Phos 38 - 126 U/L 74  72  79   AST 15 - 41 U/L 23  13  13    ALT 0 - 44 U/L 23  8  9      Lipid Panel     Component Value Date/Time   CHOL 138 03/23/2023 1617   TRIG 87 03/23/2023 1617   HDL 34 (L) 03/23/2023 1617   CHOLHDL 4.1 03/23/2023 1617   CHOLHDL 6.2 (H) 07/30/2020 1437   VLDL 21 01/26/2017 1526   LDLCALC 87 03/23/2023 1617   LDLCALC 124 (H) 07/30/2020 1437   LDLDIRECT 81 03/23/2023 1617   LABVLDL 17 03/23/2023 1617    Lab Results  Component Value Date   HGBA1C 5.9 (H) 10/08/2016     Assessment and plan:   Assessment & Plan Urinary incontinence Intermittent incontinence since radiation therapy, exacerbated by coughing or sneezing. Insurance documentation needed for supplies. - Document incontinence for insurance. [employer] form for supplies including wipes, gloves, and briefs.  Mobility issues/abnormal gait Fatigue and need for rest after walking long distances. Requires mobility assistance. - Order rolling walker with seat.  Nutritional support Needs nutritional drinks but unable to afford. Previous assistance attempts unsuccessful. - Refer to cancer center's social worker for nutritional drink assistance.     No orders of the defined types were placed in  this encounter.   Follow Up Instructions: Keep Previously scheduled appointment   I discussed the assessment and treatment plan with the patient. The patient was provided an opportunity to ask questions and all were answered. The patient agreed with the plan and demonstrated an understanding of the instructions.   The patient was advised to call back or seek an in-person evaluation if the symptoms worsen or if the condition fails to improve as anticipated.     I provided 14 minutes total of Telehealth time during this encounter including median intraservice time, reviewing previous notes, investigations, ordering medications, medical decision making, coordinating care and patient verbalized understanding at the end of the visit.     Corrina Sabin, MD, FAAFP. [facility] [place], [place] [phone]   05/14/2024, 3:03 PM

## 2024-05-14 NOTE — Patient Instructions (Signed)
 VISIT SUMMARY:  Today, we discussed your urinary incontinence, mobility issues, and nutritional support needs. We have developed a plan to address each of these concerns to improve your quality of life.  YOUR PLAN:  -URINARY INCONTINENCE: Urinary incontinence means you have trouble controlling your bladder, which can lead to accidental leakage. This started after your radiation therapy and is worse when you cough or sneeze. We will document your condition for insurance purposes and submit a form to help you get supplies like wipes, gloves, and briefs.  -MOBILITY ISSUES: You experience fatigue and need to rest after walking long distances, which affects your mobility. We will order a rolling walker with a seat to help you move around more easily.  -NUTRITIONAL SUPPORT: You need nutritional drinks but have difficulty affording them. We will refer you to the cancer center's social worker to help you get assistance with obtaining these drinks.  INSTRUCTIONS:  We will document your urinary incontinence for insurance purposes and submit the necessary forms to help you get the supplies you need. Additionally, we will order a rolling walker with a seat to assist with your mobility. Lastly, we will refer you to the cancer center's social worker for help with nutritional drinks.

## 2024-05-15 ENCOUNTER — Telehealth: Payer: Self-pay | Admitting: Family Medicine

## 2024-05-15 ENCOUNTER — Other Ambulatory Visit: Payer: Self-pay | Admitting: Physician Assistant

## 2024-05-15 DIAGNOSIS — C3491 Malignant neoplasm of unspecified part of right bronchus or lung: Secondary | ICD-10-CM

## 2024-05-15 NOTE — Telephone Encounter (Signed)
 Form has been faxed.

## 2024-05-15 NOTE — Telephone Encounter (Signed)
 Copied from CRM [id number]. Topic: Medical Record Request - Provider/Facility Request >> May 15, 2024  2:38 PM Ivette P wrote:  Reason for CRM: Bernice called in about pt requesting medical supplies. Bernice wanted to confirm order was received, was sent on 05/09/2024. Advised that yes was received.    Bernice would like to know the appropriate turn around time for this.    Callback [phone]- secured line

## 2024-05-16 ENCOUNTER — Ambulatory Visit: Attending: Cardiovascular Disease | Admitting: Internal Medicine

## 2024-05-16 ENCOUNTER — Encounter (HOSPITAL_COMMUNITY): Admission: RE | Payer: Self-pay | Source: Home / Self Care

## 2024-05-16 ENCOUNTER — Ambulatory Visit (HOSPITAL_COMMUNITY): Admission: RE | Admit: 2024-05-16 | Source: Home / Self Care | Admitting: Gastroenterology

## 2024-05-16 VITALS — BP 102/58 | HR 108 | Ht 73.0 in | Wt 179.0 lb

## 2024-05-16 DIAGNOSIS — I10 Essential (primary) hypertension: Secondary | ICD-10-CM | POA: Insufficient documentation

## 2024-05-16 DIAGNOSIS — I25118 Atherosclerotic heart disease of native coronary artery with other forms of angina pectoris: Secondary | ICD-10-CM | POA: Insufficient documentation

## 2024-05-16 DIAGNOSIS — G4733 Obstructive sleep apnea (adult) (pediatric): Secondary | ICD-10-CM | POA: Diagnosis not present

## 2024-05-16 DIAGNOSIS — E785 Hyperlipidemia, unspecified: Secondary | ICD-10-CM | POA: Diagnosis not present

## 2024-05-16 DIAGNOSIS — R1319 Other dysphagia: Secondary | ICD-10-CM | POA: Insufficient documentation

## 2024-05-16 SURGERY — MANOMETRY, ESOPHAGUS

## 2024-05-16 NOTE — Progress Notes (Unsigned)
 OFFICE NOTE  Chief Complaint:  Follow-up  Primary Care Physician: [name], MD  HPI:  Benjamin Herrera is a 58 y.o. male with a past medial history significant for a number of cardiac risk factors, who presents with upper mid-epigastric and central chest pain. Some of the episodes were worse after smoking cigarettes. He has a history of cocaine use and achalasia as well as biliary colic in the past. Troponins have been negative. EKG shows some new T wave inversions. There is a family history of early onset CAD in his mother. Based on this history I recommended a stress test. The stress test indicated a reduced LVEF of 47% with some inferior changes which were read as attenuation but concerning for ischemia. Cardiac catheterization was then recommended. He underwent left heart catheterization on 10/11/2016. This demonstrated severe two-vessel coronary disease with 100% mid to distal RCA occlusion and left to right Collaterals, as well as a 90% OM1 lesion which was successfully stented with a resolute Onyx 3 0 x 15 mm DES. He was placed on aspirin  and Plavix  and rosuvastatin  20 was started because of his concomitant use of a protease inhibitor. Since discharge she is doing very well. He saw Shona Shad, PA-C in follow-up in December and was not having any chest pain. Hypertension was well controlled. He was not on beta blocker. Today he reports that he continues to feel well and denies any chest pain or worsening shortness of breath. Blood pressure is well-controlled 118/64.  02/24/2017  Benjamin Herrera returns today for follow-up. We readjusted his medications last time and his blood pressure is much better today 120/72. Overall he feels well denies any chest pain or worsening shortness of breath. His stent was placed in December 2017 and would need to remain on dual antiplatelet therapy at least until December 2018. He is on Crestor  which is not thought to interact with his protease  inhibitors.  10/06/2017  Benjamin Herrera returns today for follow-up.  He saw Laymon Qua, PA-C in August 2018, at the time he was noted to have an elevated total cholesterol with LDL not at goal of less than 70.  His Crestor  was increased to 40 mg daily.  Lead pressure was reportedly well controlled and appears to be well controlled today at 122/64.  He denies any recurrent chest pain.  It has been close to 1 year since his stent in the OM 1.  08/06/2020  Benjamin Herrera returns today for follow-up of his coronary disease.  He denies any chest pain or worsening shortness of breath.  He reports good control of her HIV disease on protease inhibitor therapy.  He has an undetectable viral load.  His EKG shows sinus rhythm.  His blood pressure is well controlled.  Unfortunately his medications are associated with increases in his lipids and most recently his lipid profile showed total cholesterol of 193, HDL 31, LDL 124 and triglycerides 750, this in the setting of rosuvastatin  40 mg daily.  His target LDL ideally should be probably less than 70 or hopefully less than 100 on his medications.  He remains asymptomatic from a cardiac standpoint.  He reports occasionally taking extra metoprolol  for a nondescript chest twinging, no more than twice a week.  He has not used nitroglycerin .  05/17/2024  Benjamin Herrera returns today for follow-up of coronary artery disease.  Unfortunately he has not had a recent diagnosis of cancer.  He says he has been on chemotherapy for this.  He has an  upcoming palliative care evaluation as well.  From a cardiac standpoint he is denying any chest pain or worsening shortness of breath.  He has been off of his statin medications.  His last lipid profile was in May 2024 but his LDL was 87.  PMHx:  Past Medical History:  Diagnosis Date   Achalasia and cardiospasm    Allergy    Anxiety    Arthritis    back ,shoulders    Asthma    Boils    under arms hx of   CAD (coronary artery  disease)    a. 09/2016: cath showing 100% RCA occlusion with collaterals and 90% OM1 (treated with DES)   Candidiasis of mouth    Candidiasis of unspecified site    Cholelithiasis    Cocaine abuse (HCC)    Quit 09/2016   Condyloma acuminatum in male of scrotum & anal canal s/p laser ablation 07/03/2012   Coronary artery disease    Dysphagia    Fracture, humerus    Frozen shoulder    left   GERD (gastroesophageal reflux disease)    Heart attack (HCC)    Hemorrhoids    Hepatitis 1993   A   Herpes labialis    HIV (human immunodeficiency virus infection) (HCC) DX 1993   Hypertension Dx 2014   off all bp meds for last 9 or 10 months   Pilonidal disease 01/10/2012   Polysubstance abuse (HCC)    Psoriasis    hx of   Sleep apnea    does  have CPAP   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/03/2012   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 08/03/2012    Past Surgical History:  Procedure Laterality Date   BALLOON DILATION N/A 11/11/2015   Procedure: BALLOON DILATION;  Surgeon: Gustav Shila GAILS, MD;  Location: [facility];  Service: Endoscopy;  Laterality: N/A;   BOTOX  INJECTION  10/11/2012   Procedure: BOTOX  INJECTION;  Surgeon: Lamar JONETTA Aho, MD;  Location: [facility];  Service: Endoscopy;  Laterality: N/A;   BOTOX  INJECTION N/A 01/25/2013   Procedure: BOTOX  INJECTION;  Surgeon: Lamar JONETTA Aho, MD;  Location: [facility];  Service: Endoscopy;  Laterality: N/A;   BOTOX  INJECTION N/A 06/21/2013   Procedure: BOTOX  INJECTION;  Surgeon: Lamar JONETTA Aho, MD;  Location: [facility];  Service: Endoscopy;  Laterality: N/A;   BOTOX  INJECTION N/A 10/08/2013   Procedure: BOTOX  INJECTION;  Surgeon: Lamar JONETTA Aho, MD;  Location: [facility];  Service: Endoscopy;  Laterality: N/A;   BOTOX  INJECTION N/A 06/16/2015   Procedure: BOTOX  INJECTION;  Surgeon: Lamar JONETTA Aho, MD;  Location: [facility];  Service: Endoscopy;  Laterality:  N/A;   BRONCHIAL BIOPSY  04/03/2024   Procedure: BRONCHOSCOPY, WITH BIOPSY;  Surgeon: Shelah Lamar RAMAN, MD;  Location: [facility];  Service: Pulmonary;;   BRONCHIAL BRUSHINGS  04/03/2024   Procedure: BRONCHOSCOPY, WITH BRUSH BIOPSY;  Surgeon: Shelah Lamar RAMAN, MD;  Location: [facility];  Service: Pulmonary;;   BRONCHIAL NEEDLE ASPIRATION BIOPSY  04/03/2024   Procedure: BRONCHOSCOPY, WITH NEEDLE ASPIRATION BIOPSY;  Surgeon: Shelah Lamar RAMAN, MD;  Location: [facility];  Service: Pulmonary;;   BRONCHOSCOPY, WITH BIOPSY USING ELECTROMAGNETIC NAVIGATION Right 04/03/2024   Procedure: BRONCHOSCOPY, WITH BIOPSY USING ELECTROMAGNETIC NAVIGATION;  Surgeon: Shelah Lamar RAMAN, MD;  Location: [facility];  Service: Pulmonary;  Laterality: Right;   CARDIAC CATHETERIZATION N/A 10/11/2016   Procedure: Left Heart Cath and Coronary Angiography;  Surgeon:  Lonni JONETTA Cash, MD;  mRCA 100% w/ L>R collaterals, OM1 90%   CARDIAC CATHETERIZATION N/A 10/11/2016   Procedure: Coronary Stent Intervention;  Surgeon: Lonni JONETTA Cash, MD;  RESOLUTE ONYX 3.5X15 DES OM1   COLONOSCOPY  05/19/2012   Procedure: COLONOSCOPY;  Surgeon: Lamar JONETTA Aho, MD;  Location: [facility];  Service: Endoscopy;  Laterality: N/A;  jill trying to contact pt to come in 0830 for 930 case, phone not accepting messages   COLONOSCOPY     ESOPHAGEAL MANOMETRY  05/29/2012   Procedure: ESOPHAGEAL MANOMETRY (EM);  Surgeon: Lamar JONETTA Aho, MD;  Location: [facility];  Service: Endoscopy;  Laterality: N/A;   ESOPHAGEAL MANOMETRY N/A 10/06/2015   Procedure: ESOPHAGEAL MANOMETRY (EM);  Surgeon: Gustav Shila GAILS, MD;  Location: [facility];  Service: Endoscopy;  Laterality: N/A;   ESOPHAGOGASTRODUODENOSCOPY  11/11/2011   Procedure: ESOPHAGOGASTRODUODENOSCOPY (EGD);  Surgeon: Lamar JONETTA Aho, MD;  Location: [facility];  Service: Endoscopy;  Laterality: N/A;  botox  injection  called Pt to change time of procedure per Dr Aho    ESOPHAGOGASTRODUODENOSCOPY  03/10/2012   Procedure: ESOPHAGOGASTRODUODENOSCOPY (EGD);  Surgeon: Lamar JONETTA Aho, MD;  Location: [facility];  Service: Endoscopy;  Laterality: N/A;   ESOPHAGOGASTRODUODENOSCOPY  10/11/2012   Procedure: ESOPHAGOGASTRODUODENOSCOPY (EGD);  Surgeon: Lamar JONETTA Aho, MD;  Location: [facility];  Service: Endoscopy;  Laterality: N/A;   ESOPHAGOGASTRODUODENOSCOPY N/A 01/25/2013   Procedure: ESOPHAGOGASTRODUODENOSCOPY (EGD);  Surgeon: Lamar JONETTA Aho, MD;  Location: [facility];  Service: Endoscopy;  Laterality: N/A;   ESOPHAGOGASTRODUODENOSCOPY N/A 06/21/2013   Procedure: ESOPHAGOGASTRODUODENOSCOPY (EGD);  Surgeon: Lamar JONETTA Aho, MD;  Location: [facility];  Service: Endoscopy;  Laterality: N/A;   ESOPHAGOGASTRODUODENOSCOPY (EGD) WITH PROPOFOL  N/A 10/08/2013   Procedure: ESOPHAGOGASTRODUODENOSCOPY (EGD) WITH PROPOFOL ;  Surgeon: Lamar JONETTA Aho, MD;  Location: [facility];  Service: Endoscopy;  Laterality: N/A;   ESOPHAGOGASTRODUODENOSCOPY (EGD) WITH PROPOFOL  N/A 06/16/2015   Procedure: ESOPHAGOGASTRODUODENOSCOPY (EGD) WITH PROPOFOL ;  Surgeon: Lamar JONETTA Aho, MD;  Location: [facility];  Service: Endoscopy;  Laterality: N/A;   ESOPHAGOGASTRODUODENOSCOPY (EGD) WITH PROPOFOL  N/A 11/11/2015   Procedure: ESOPHAGOGASTRODUODENOSCOPY (EGD) WITH PROPOFOL ;  Surgeon: Gustav Shila GAILS, MD;  Location: [facility];  Service: Endoscopy;  Laterality: N/A;   EXAMINATION UNDER ANESTHESIA  07/24/2012   Procedure: EXAM UNDER ANESTHESIA;  Surgeon: Elspeth KYM Schultze, MD;  Location: [facility];  Service: General;  Laterality: N/A;   humeral fracture surgery Right yrs ago   PILONIDAL CYST EXCISION  07/24/2012   Procedure: CYST EXCISION PILONIDAL SIMPLE;  Surgeon: Elspeth KYM Schultze, MD;  Location: [facility];  Service: General;  Laterality: N/A;  Exam Under Anesthesia,, Excision Pilonidal Disease,    right shoulder replacement  2008   x 2   UPPER GASTROINTESTINAL ENDOSCOPY     WART FULGURATION   07/24/2012   Procedure: FULGURATION ANAL WART;  Surgeon: Elspeth KYM Schultze, MD;  Location: [facility];  Service: General;  Laterality: N/A;  excision of raphe mass    FAMHx:  Family History  Problem Relation Age of Onset   Arthritis Mother    Hypertension Mother    Heart disease Mother        MI age 86   Heart attack Sister    Diabetes Maternal Grandmother    Stomach cancer Paternal Grandmother    Colon cancer Neg Hx    Colon polyps Neg Hx    Esophageal cancer Neg Hx    Rectal cancer Neg Hx     SOCHx:   reports that he has quit  smoking. His smoking use included cigarettes. He started smoking about 42 years ago. He has a 42.6 pack-year smoking history. He has never used smokeless tobacco. He reports that he does not currently use drugs after having used the following drugs: Marijuana. Frequency: 3.00 times per week. He reports that he does not drink alcohol.  ALLERGIES:  Allergies  Allergen Reactions   Citrus Swelling    Mouth itching and swelling     ROS: Pertinent items noted in HPI and remainder of comprehensive ROS otherwise negative.  HOME MEDS: Current Outpatient Medications on File Prior to Visit  Medication Sig Dispense Refill   acetaminophen  (TYLENOL ) 325 MG tablet Take 650 mg by mouth every 6 (six) hours as needed for mild pain (pain score 1-3), moderate pain (pain score 4-6) or headache.     albuterol  (VENTOLIN  HFA) 108 (90 Base) MCG/ACT inhaler Inhale 2 puffs into the lungs every 6 (six) hours as needed for wheezing or shortness of breath. 1 each 2   amLODipine  (NORVASC ) 5 MG tablet TAKE 1 TABLET (5 MG TOTAL) BY MOUTH DAILY. 30 tablet 0   amoxicillin -clavulanate (AUGMENTIN ) 875-125 MG tablet Take 1 tablet by mouth 2 (two) times daily. 20 tablet 0   aspirin  81 MG chewable tablet Chew 1 tablet (81 mg total) by mouth daily. 90 tablet 3   Bictegravir-Emtricitab-Tenofov (BIKTARVY ) 30-120-15 MG TABS Take 1 tablet by mouth daily. 90 tablet 3   Blood Pressure KIT 1 kit by Does  not apply route daily. 1 kit 0   diclofenac  Sodium (VOLTAREN  ARTHRITIS PAIN) 1 % GEL Apply 2 g topically 4 (four) times daily. 100 g 3   folic acid  (FOLVITE ) 1 MG tablet Take 1 tablet (1 mg total) by mouth daily. Start 7 days before pemetrexed  chemotherapy. Continue until 21 days after pemetrexed  completed. 100 tablet 3   HYDROcodone -acetaminophen  (NORCO/VICODIN) 5-325 MG tablet Take 1 tablet by mouth every 6 (six) hours as needed.     hydrOXYzine  (ATARAX ) 25 MG tablet Take 1 tablet (25 mg total) by mouth 3 (three) times daily as needed. 30 tablet 0   lidocaine  (XYLOCAINE ) 2 % solution SWISH AND SWALLOW 5 MLS 3 (THREE) TIMES DAILY AS NEEDED FOR MOUTH PAIN. 360 mL 1   lidocaine -prilocaine  (EMLA ) cream Apply to affected area once 30 g 3   LORazepam  (ATIVAN ) 0.5 MG tablet Take 1 tablet (0.5 mg total) by mouth as needed for anxiety. Take 30 min prior to scan and have a ride arranged from the scan. 1 tablet 0   magic mouthwash (nystatin , lidocaine , diphenhydrAMINE , alum & mag hydroxide) suspension Swish and swallow 5 mLs 3 (three) times daily as needed for mouth pain. 180 mL 2   methocarbamol  (ROBAXIN ) 500 MG tablet Take 2 tablets (1,000 mg total) by mouth every 8 (eight) hours as needed. 90 tablet 1   metoprolol  succinate (TOPROL -XL) 25 MG 24 hr tablet TAKE 1/2 TABLET BY MOUTH DAILY 45 tablet 0   nitroGLYCERIN  (NITROSTAT ) 0.4 MG SL tablet Place 1 tablet (0.4 mg total) under the tongue every 5 (five) minutes as needed for chest pain (trouble swallowing). 25 tablet 1   ondansetron  (ZOFRAN ) 8 MG tablet Take 1 tablet (8 mg total) by mouth every 8 (eight) hours as needed for nausea or vomiting. Start on the third day after carboplatin . 30 tablet 1   oxyCODONE -acetaminophen  (PERCOCET/ROXICET) 5-325 MG tablet Take 1 tablet by mouth every 8 (eight) hours as needed for severe pain (pain score 7-10). 30 tablet 0   prochlorperazine  (COMPAZINE )  10 MG tablet Take 1 tablet (10 mg total) by mouth every 6 (six) hours as  needed for nausea or vomiting. 30 tablet 1   No current facility-administered medications on file prior to visit.    LABS/IMAGING: No results found for this or any previous visit (from the past 48 hours). No results found.  WEIGHTS: Wt Readings from Last 3 Encounters:  05/16/24 179 lb (81.2 kg)  05/10/24 189 lb (85.7 kg)  05/09/24 188 lb 12.8 oz (85.6 kg)    VITALS: BP (!) 102/58 (BP Location: Right Arm)   Pulse (!) 108   Ht 6' 1 (1.854 m)   Wt 179 lb (81.2 kg)   SpO2 96%   BMI 23.62 kg/m   EXAM: General appearance: alert and no distress Neck: no carotid bruit, no JVD and thyroid not enlarged, symmetric, no tenderness/mass/nodules Lungs: clear to auscultation bilaterally Heart: regular rate and rhythm, S1, S2 normal, no murmur, click, rub or gallop Abdomen: soft, non-tender; bowel sounds normal; no masses,  no organomegaly Extremities: extremities normal, atraumatic, no cyanosis or edema Pulses: 2+ and symmetric Skin: Skin color, texture, turgor normal. No rashes or lesions Neurologic: Grossly normal Psych: Pleasant  EKG: Deferred  ASSESSMENT: CAD with occluded mid to distal RCA and left-to-right collaterals and status post PCI to OM1 with a 3.015 mm resolute onyx DES (09/2016) Hypertension Dyslipidemia HIV- on protease inhibitors OSA on CPAP Stage IV lung cancer  PLAN: 1.   Benjamin Herrera was recently diagnosed with stage IV lung cancer and is currently undergoing some chemotherapy for this.  He also has an upcoming appoint with palliative care.  He has been off his statin therapy.  Blood pressure is well-controlled if not hypotensive today.  He denies any cardiac symptoms.  Will continue the current medications.  May not be reasonable to restart statins based on his chemotherapy if there have been issues with liver enzymes, however his last AST and ALT were normal in July.  Follow-up in 6 months or sooner as necessary  Vinie KYM Maxcy, MD, CODY CALANDRA  [facility]  Medical Director of the [facility]  [facility] Attending Cardiologist  Direct Dial: [phone]  Fax: [phone]  Website:  [email or web address]  Vinie BROCKS Seda Kronberg 05/16/2024, 2:18 PM

## 2024-05-16 NOTE — Patient Instructions (Signed)
 Medication Instructions:  Your physician recommends that you continue on your current medications as directed. Please refer to the Current Medication list given to you today.  *If you need a refill on your cardiac medications before your next appointment, please call your pharmacy*  Lab Work: Tomorrow: fasting Lipid panel If you have labs (blood work) drawn today and your tests are completely normal, you will receive your results only by: MyChart Message (if you have MyChart) OR A paper copy in the mail If you have any lab test that is abnormal or we need to change your treatment, we will call you to review the results.  Follow-Up: At [facility], you and your health needs are our priority.  As part of our continuing mission to provide you with exceptional heart care, our providers are all part of one team.  This team includes your primary Cardiologist (physician) and Advanced Practice Providers or APPs (Physician Assistants and Nurse Practitioners) who all work together to provide you with the care you need, when you need it.  Your next appointment:   6 month(s)  Provider:   One of our Advanced Practice Providers (APPs): Morse Clause, PA-C  Lamarr Satterfield, NP Miriam Shams, NP  Olivia Pavy, PA-C Josefa Beauvais, NP  Leontine Salen, PA-C Orren Fabry, PA-C  [place], PA-C Ernest Dick, NP  Damien Braver, NP Jon Hails, PA-C  Waddell Donath, PA-C    Dayna Dunn, PA-C  Scott Weaver, PA-C Lum Louis, NP Katlyn West, NP Callie Goodrich, PA-C  Evan Williams, PA-C Sheng Haley, PA-C  Xika Zhao, NP Kathleen July Linam, PA-C   We recommend signing up for the patient portal called MyChart.  Sign up information is provided on this After Visit Summary.  MyChart is used to connect with patients for Virtual Visits (Telemedicine).  Patients are able to view lab/test results, encounter notes, upcoming appointments, etc.  Non-urgent messages can be sent to your provider as well.   To  learn more about what you can do with MyChart, go to [email or web address].

## 2024-05-17 ENCOUNTER — Inpatient Hospital Stay

## 2024-05-17 ENCOUNTER — Other Ambulatory Visit: Payer: Self-pay

## 2024-05-17 ENCOUNTER — Encounter: Payer: Self-pay | Admitting: Internal Medicine

## 2024-05-17 ENCOUNTER — Inpatient Hospital Stay: Admitting: Licensed Clinical Social Worker

## 2024-05-17 ENCOUNTER — Inpatient Hospital Stay (HOSPITAL_BASED_OUTPATIENT_CLINIC_OR_DEPARTMENT_OTHER): Admitting: Nurse Practitioner

## 2024-05-17 VITALS — BP 107/72 | HR 103 | Temp 97.7°F | Resp 18 | Wt 180.3 lb

## 2024-05-17 DIAGNOSIS — R630 Anorexia: Secondary | ICD-10-CM

## 2024-05-17 DIAGNOSIS — R918 Other nonspecific abnormal finding of lung field: Secondary | ICD-10-CM | POA: Diagnosis not present

## 2024-05-17 DIAGNOSIS — Z515 Encounter for palliative care: Secondary | ICD-10-CM

## 2024-05-17 DIAGNOSIS — C3491 Malignant neoplasm of unspecified part of right bronchus or lung: Secondary | ICD-10-CM

## 2024-05-17 DIAGNOSIS — Z510 Encounter for antineoplastic radiation therapy: Secondary | ICD-10-CM | POA: Diagnosis not present

## 2024-05-17 DIAGNOSIS — C3490 Malignant neoplasm of unspecified part of unspecified bronchus or lung: Secondary | ICD-10-CM

## 2024-05-17 DIAGNOSIS — G4700 Insomnia, unspecified: Secondary | ICD-10-CM | POA: Diagnosis not present

## 2024-05-17 DIAGNOSIS — G893 Neoplasm related pain (acute) (chronic): Secondary | ICD-10-CM

## 2024-05-17 DIAGNOSIS — C7951 Secondary malignant neoplasm of bone: Secondary | ICD-10-CM | POA: Diagnosis not present

## 2024-05-17 DIAGNOSIS — C3411 Malignant neoplasm of upper lobe, right bronchus or lung: Secondary | ICD-10-CM | POA: Diagnosis not present

## 2024-05-17 DIAGNOSIS — M899 Disorder of bone, unspecified: Secondary | ICD-10-CM | POA: Diagnosis not present

## 2024-05-17 LAB — CBC WITH DIFFERENTIAL (CANCER CENTER ONLY)
Abs Immature Granulocytes: 0.34 K/uL — ABNORMAL HIGH (ref 0.00–0.07)
Basophils Absolute: 0.1 K/uL (ref 0.0–0.1)
Basophils Relative: 2 %
Eosinophils Absolute: 0.1 K/uL (ref 0.0–0.5)
Eosinophils Relative: 4 %
HCT: 41.2 % (ref 39.0–52.0)
Hemoglobin: 14 g/dL (ref 13.0–17.0)
Immature Granulocytes: 10 %
Lymphocytes Relative: 12 %
Lymphs Abs: 0.4 K/uL — ABNORMAL LOW (ref 0.7–4.0)
MCH: 25.4 pg — ABNORMAL LOW (ref 26.0–34.0)
MCHC: 34 g/dL (ref 30.0–36.0)
MCV: 74.8 fL — ABNORMAL LOW (ref 80.0–100.0)
Monocytes Absolute: 0.4 K/uL (ref 0.1–1.0)
Monocytes Relative: 11 %
Neutro Abs: 2.1 K/uL (ref 1.7–7.7)
Neutrophils Relative %: 61 %
Platelet Count: 118 K/uL — ABNORMAL LOW (ref 150–400)
RBC: 5.51 MIL/uL (ref 4.22–5.81)
RDW: 14.6 % (ref 11.5–15.5)
WBC Count: 3.4 K/uL — ABNORMAL LOW (ref 4.0–10.5)
nRBC: 0 % (ref 0.0–0.2)

## 2024-05-17 LAB — RAPID URINE DRUG SCREEN, HOSP PERFORMED
Amphetamines: NOT DETECTED
Barbiturates: NOT DETECTED
Benzodiazepines: NOT DETECTED
Cocaine: POSITIVE — AB
Opiates: NOT DETECTED
Tetrahydrocannabinol: NOT DETECTED

## 2024-05-17 LAB — CMP (CANCER CENTER ONLY)
ALT: 16 U/L (ref 0–44)
AST: 18 U/L (ref 15–41)
Albumin: 3.9 g/dL (ref 3.5–5.0)
Alkaline Phosphatase: 80 U/L (ref 38–126)
Anion gap: 8 (ref 5–15)
BUN: 25 mg/dL — ABNORMAL HIGH (ref 6–20)
CO2: 25 mmol/L (ref 22–32)
Calcium: 6.7 mg/dL — ABNORMAL LOW (ref 8.9–10.3)
Chloride: 98 mmol/L (ref 98–111)
Creatinine: 1.3 mg/dL — ABNORMAL HIGH (ref 0.61–1.24)
GFR, Estimated: >60 mL/min (ref >60)
Glucose, Bld: 111 mg/dL — ABNORMAL HIGH (ref 70–99)
Potassium: 3.5 mmol/L (ref 3.5–5.1)
Sodium: 131 mmol/L — ABNORMAL LOW (ref 135–145)
Total Bilirubin: 0.4 mg/dL (ref 0.0–1.2)
Total Protein: 8 g/dL (ref 6.5–8.1)

## 2024-05-17 MED ORDER — OXYCODONE-ACETAMINOPHEN 5-325 MG PO TABS: 1.0000 | ORAL_TABLET | Freq: Three times a day (TID) | ORAL | 0 refills | Status: DC | PRN

## 2024-05-17 NOTE — Progress Notes (Signed)
 Palliative Medicine [facility]  Telephone:([phone]) [phone] Fax:([phone]) [phone]   Name: [name] Date: 05/17/2024 MRN: [id number]  DOB: September 26, 1966  Patient Care Team: [name], MD as PCP - General (Family Medicine) Eben Reyes [name], MD as PCP - Infectious Diseases (Infectious Diseases) Mona Vinie [name], MD as PCP - Cardiology (Cardiology) Debrah Lamar [name], MD (Inactive) as Consulting Physician (Gastroenterology) [name], MD as Consulting Physician (General Surgery) [name], RN as Oncology Nurse Navigator    REASON FOR CONSULTATION: Benjamin Herrera is a 58 y.o. male his medical problems include: HIV, hypertension, coronary artery disease (hx MI, status post DES to OM1 in 2017), chronic low back pain, SCC of skin, OSA, polysubstance abuse, GERD, asthma, anxiety, arthritis, more recently he was diagnosed with Adenocarcinoma of right lung, stage 4.  Oncologic history per notes:  DIAGNOSIS:  stage IV (T3, N3, M1 C) non-small cell lung cancer, adenocarcinoma presented with large right upper lobe lung mass in addition to right hilar and mediastinal as well as right supraclavicular lymphadenopathy and metastatic bone lesions including the left proximal humerus, L2 as well as sacral area and left adrenal metastasis diagnosed in June 2025.   Molecular studies show PDL1 of 0%     PRIOR THERAPY: Palliative radiation to the painful bone lesion in the left humerus. Last day on 05/03/24.   CURRENT THERAPY: Systemic chemotherapy with Carboplatin  for an AUC of 5, Alimta  500 mg/m2, and Libatyo IV every 3 weeks. He completed palliative radiation on 05/03/2024. First dose chemotherapy given 05/02/2024.  Significant neutropenia and thrombocytopenia have developed following initial dose chemotherapy.  He is to receive 3 consecutive doses of growth factor support  Per Dr. Sherrod: Intent of therapy is palliative.  Palliative is seeing patient for symptom  management and goals of care.    SOCIAL HISTORY:     reports that he has quit smoking. His smoking use included cigarettes. He started smoking about 42 years ago. He has a 42.6 pack-year smoking history. He has never used smokeless tobacco. He reports that he does not currently use drugs after having used the following drugs: Marijuana. Frequency: 3.00 times per week. He reports that he does not drink alcohol. Hx cocaine use.   ADVANCE DIRECTIVES:  He indicates he is planning to return and meet with SW to complete Advanced Directives and HCPOA. He states he wants to do everything he can to live. He will do any treatments recommended to prolong life. He is also willing to undergo CPR/intubation in an attempt to prolong life.   CODE STATUS: Full code  PAST MEDICAL HISTORY: Past Medical History:  Diagnosis Date   Achalasia and cardiospasm    Allergy    Anxiety    Arthritis    back ,shoulders    Asthma    Boils    under arms hx of   CAD (coronary artery disease)    a. 09/2016: cath showing 100% RCA occlusion with collaterals and 90% OM1 (treated with DES)   Candidiasis of mouth    Candidiasis of unspecified site    Cholelithiasis    Cocaine abuse (HCC)    Quit 09/2016   Condyloma acuminatum in male of scrotum & anal canal s/p laser ablation 07/03/2012   Coronary artery disease    Dysphagia    Fracture, humerus    Frozen shoulder    left   GERD (gastroesophageal reflux disease)    Heart attack (HCC)    Hemorrhoids  Hepatitis 1993   A   Herpes labialis    HIV (human immunodeficiency virus infection) (HCC) DX 1993   Hypertension Dx 2014   off all bp meds for last 9 or 10 months   Pilonidal disease 01/10/2012   Polysubstance abuse (HCC)    Psoriasis    hx of   Sleep apnea    does  have CPAP   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/03/2012   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 08/03/2012    PAST SURGICAL  HISTORY:  Past Surgical History:  Procedure Laterality Date   BALLOON DILATION N/A 11/11/2015   Procedure: BALLOON DILATION;  Surgeon: Gustav Shila GAILS, MD;  Location: [facility];  Service: Endoscopy;  Laterality: N/A;   BOTOX  INJECTION  10/11/2012   Procedure: BOTOX  INJECTION;  Surgeon: Lamar JONETTA Aho, MD;  Location: [facility];  Service: Endoscopy;  Laterality: N/A;   BOTOX  INJECTION N/A 01/25/2013   Procedure: BOTOX  INJECTION;  Surgeon: Lamar JONETTA Aho, MD;  Location: [facility];  Service: Endoscopy;  Laterality: N/A;   BOTOX  INJECTION N/A 06/21/2013   Procedure: BOTOX  INJECTION;  Surgeon: Lamar JONETTA Aho, MD;  Location: [facility];  Service: Endoscopy;  Laterality: N/A;   BOTOX  INJECTION N/A 10/08/2013   Procedure: BOTOX  INJECTION;  Surgeon: Lamar JONETTA Aho, MD;  Location: [facility];  Service: Endoscopy;  Laterality: N/A;   BOTOX  INJECTION N/A 06/16/2015   Procedure: BOTOX  INJECTION;  Surgeon: Lamar JONETTA Aho, MD;  [facility]: [facility];  Service: Endoscopy;  Laterality: N/A;   BRONCHIAL BIOPSY  04/03/2024   Procedure: BRONCHOSCOPY, WITH BIOPSY;  Surgeon: Shelah Lamar RAMAN, MD;  Location[facility] END[facility]COPY;  Service: Pulmonary;;   BRONCHIAL BRUSHINGS  04/03/2024   Procedure: BRONCHOSCOPY, WITH BRUSH BIOPSY;  Surgeon: Shelah Lamar RAMAN, MD;  Location: [facility] ENDOSCOPY;  Service: Pulmonary;;   BRONCHIAL NEEDLE ASPIRATION BIOPSY  04/03/2024   Procedure: BRONCHOSCOPY, WITH NEEDLE ASPIRATION BIOPSY;  Surgeon: Shelah Lamar RAMAN, MD;  Location: [facility] ENDOSCOPY;  Service: Pulmonary;;   BRONCHOSCOPY, WITH BIOPSY USING ELECTROMAGNETIC NAVIGATION Right 04/03/2024   Procedure: BRONCHOSCOPY, WITH BIOPSY USING ELECTROMAGNETIC NAVIGATION;  Surgeon: Shelah Lamar RAMAN, MD;  Location: [facility] ENDOSCOPY;  Service: Pulmonary;  Laterality: Right;   CARDIAC CATHETERIZATION N/A 10/11/2016   Procedure: Left Heart Cath and Coronary Angiography;  Surgeon: Lonni JONETTA Cash, MD;  mRCA 100% w/ L>R collaterals, OM1 90%   CARDIAC  CATHETERIZATION N/A 10/11/2016   Procedure: Coronary Stent Intervention;  Surgeon: Lonni JONETTA Cash, MD;  RESOLUTE ONYX 3.5X15 DES OM1   COLONOSCOPY  05/19/2012   Procedure: COLONOSCOPY;  Surgeon: Lamar JONETTA Aho, MD;  Location: [facility];  Service: Endoscopy;  Laterality: N/A;  jill trying to contact pt to come in 0830 for 930 case, phone not accepting messages   COLONOSCOPY     ESOPHAGEAL MANOMETRY  05/29/2012   Procedure: ESOPHAGEAL MANOMETRY (EM);  Surgeon: Lamar JONETTA Aho, MD;  Location: [facility];  Service: Endoscopy;  Laterality: N/A;   ESOPHAGEAL MANOMETRY N/A 10/06/2015   Procedure: ESOPHAGEAL MANOMETRY (EM);  Surgeon: Gustav Shila GAILS, MD;  Location: [facility];  Service: Endoscopy;  Laterality: N/A;   ESOPHAGOGASTRODUODENOSCOPY  11/11/2011   Procedure: ESOPHAGOGASTRODUODENOSCOPY (EGD);  Surgeon: Lamar JONETTA Aho, MD;  Location: [facility];  Service: Endoscopy;  Laterality: N/A;  botox  injection  called Pt to change time of procedure per Dr Aho   ESOPHAGOGASTRODUODENOSCOPY  03/10/2012   Procedure: ESOPHAGOGASTRODUODENOSCOPY (EGD);  Surgeon: Lamar JONETTA Aho, MD;  [facility]: [facility];  Service: Endoscopy;  Laterality: N/A;   ESOPHAGOGASTRODUODENOSCOPY  10/11/2012   Procedure: ESOPHAGOGASTRODUODENOSCOPY (EGD);  Surgeon: Lamar JONETTA Aho, MD;  Location: [facility];  Service: Endoscopy;  Laterality: N/A;   ESOPHAGOGASTRODUODENOSCOPY N/A 01/25/2013   Procedure: ESOPHAGOGASTRODUODENOSCOPY (EGD);  Surgeon: Lamar JONETTA Aho, MD;  Location: [facility];  Service: Endoscopy;  Laterality: N/A;   ESOPHAGOGASTRODUODENOSCOPY N/A 06/21/2013   Procedure: ESOPHAGOGASTRODUODENOSCOPY (EGD);  Surgeon: Lamar JONETTA Aho, MD;  Location: [facility];  Service: Endoscopy;  Laterality: N/A;   ESOPHAGOGASTRODUODENOSCOPY (EGD) WITH PROPOFOL  N/A 10/08/2013   Procedure: ESOPHAGOGASTRODUODENOSCOPY (EGD) WITH PROPOFOL ;  Surgeon: Lamar JONETTA Aho, MD;  Location: [facility];  Service: Endoscopy;   Laterality: N/A;   ESOPHAGOGASTRODUODENOSCOPY (EGD) WITH PROPOFOL  N/A 06/16/2015   Procedure: ESOPHAGOGASTRODUODENOSCOPY (EGD) WITH PROPOFOL ;  Surgeon: Lamar JONETTA Aho, MD;  Location: [facility];  Service: Endoscopy;  Laterality: N/A;   ESOPHAGOGASTRODUODENOSCOPY (EGD) WITH PROPOFOL  N/A 11/11/2015   Procedure: ESOPHAGOGASTRODUODENOSCOPY (EGD) WITH PROPOFOL ;  Surgeon: Gustav Shila GAILS, MD;  Location: [facility];  Service: Endoscopy;  Laterality: N/A;   EXAMINATION UNDER ANESTHESIA  07/24/2012   Procedure: EXAM UNDER ANESTHESIA;  Surgeon: Elspeth KYM Schultze, MD;  Location: [facility];  Service: General;  Laterality: N/A;   humeral fracture surgery Right yrs ago   PILONIDAL CYST EXCISION  07/24/2012   Procedure: CYST EXCISION PILONIDAL SIMPLE;  Surgeon: Elspeth KYM Schultze, MD;  Location: [facility];  Service: General;  Laterality: N/A;  Exam Under Anesthesia,, Excision Pilonidal Disease,    right shoulder replacement  2008   x 2   UPPER GASTROINTESTINAL ENDOSCOPY     WART FULGURATION  07/24/2012   Procedure: FULGURATION ANAL WART;  Surgeon: Elspeth KYM Schultze, MD;  Location: [facility];  Service: General;  Laterality: N/A;  excision of raphe mass    HEMATOLOGY/ONCOLOGY HISTORY:  Oncology History  Adenocarcinoma of right lung, stage 4 (HCC)  04/16/2024 Initial Diagnosis   Adenocarcinoma of right lung, stage 4 (HCC)   05/02/2024 -  Chemotherapy   Patient is on Treatment Plan : LUNG Carboplatin  (5) + Pemetrexed  (500) + Libtayo D1 q21d Induction x 4 cycles / Maintenance Pemetrexed  (500) + Pembrolizumab (200) D1 q21d       ALLERGIES:  is allergic to citrus.  MEDICATIONS:  Current Outpatient Medications  Medication Sig Dispense Refill   acetaminophen  (TYLENOL ) 325 MG tablet Take 650 mg by mouth every 6 (six) hours as needed for mild pain (pain score 1-3), moderate pain (pain score 4-6) or headache.     albuterol  (VENTOLIN  HFA) 108 (90 Base) MCG/ACT inhaler Inhale 2 puffs into the lungs every 6 (six) hours as  needed for wheezing or shortness of breath. 1 each 2   amLODipine  (NORVASC ) 5 MG tablet TAKE 1 TABLET (5 MG TOTAL) BY MOUTH DAILY. 30 tablet 0   amoxicillin -clavulanate (AUGMENTIN ) 875-125 MG tablet Take 1 tablet by mouth 2 (two) times daily. 20 tablet 0   aspirin  81 MG chewable tablet Chew 1 tablet (81 mg total) by mouth daily. 90 tablet 3   Bictegravir-Emtricitab-Tenofov (BIKTARVY ) 30-120-15 MG TABS Take 1 tablet by mouth daily. 90 tablet 3   Blood Pressure KIT 1 kit by Does not apply route daily. 1 kit 0   diclofenac  Sodium (VOLTAREN  ARTHRITIS PAIN) 1 % GEL Apply 2 g topically 4 (four) times daily. 100 g 3   folic acid  (FOLVITE ) 1 MG tablet Take 1 tablet (1 mg total) by mouth daily. Start 7 days before pemetrexed  chemotherapy. Continue until 21 days after pemetrexed   completed. 100 tablet 3   HYDROcodone -acetaminophen  (NORCO/VICODIN) 5-325 MG tablet Take 1 tablet by mouth every 6 (six) hours as needed.     hydrOXYzine  (ATARAX ) 25 MG tablet Take 1 tablet (25 mg total) by mouth 3 (three) times daily as needed. 30 tablet 0   lidocaine  (XYLOCAINE ) 2 % solution SWISH AND SWALLOW 5 MLS 3 (THREE) TIMES DAILY AS NEEDED FOR MOUTH PAIN. 360 mL 1   lidocaine -prilocaine  (EMLA ) cream Apply to affected area once 30 g 3   LORazepam  (ATIVAN ) 0.5 MG tablet Take 1 tablet (0.5 mg total) by mouth as needed for anxiety. Take 30 min prior to scan and have a ride arranged from the scan. 1 tablet 0   magic mouthwash (nystatin , lidocaine , diphenhydrAMINE , alum & mag hydroxide) suspension Swish and swallow 5 mLs 3 (three) times daily as needed for mouth pain. 180 mL 2   methocarbamol  (ROBAXIN ) 500 MG tablet Take 2 tablets (1,000 mg total) by mouth every 8 (eight) hours as needed. 90 tablet 1   metoprolol  succinate (TOPROL -XL) 25 MG 24 hr tablet TAKE 1/2 TABLET BY MOUTH DAILY 45 tablet 0   nitroGLYCERIN  (NITROSTAT ) 0.4 MG SL tablet Place 1 tablet (0.4 mg total) under the tongue every 5 (five) minutes as needed for chest pain  (trouble swallowing). 25 tablet 1   ondansetron  (ZOFRAN ) 8 MG tablet Take 1 tablet (8 mg total) by mouth every 8 (eight) hours as needed for nausea or vomiting. Start on the third day after carboplatin . 30 tablet 1   oxyCODONE -acetaminophen  (PERCOCET/ROXICET) 5-325 MG tablet Take 1 tablet by mouth every 8 (eight) hours as needed for severe pain (pain score 7-10). 30 tablet 0   prochlorperazine  (COMPAZINE ) 10 MG tablet Take 1 tablet (10 mg total) by mouth every 6 (six) hours as needed for nausea or vomiting. 30 tablet 1   No current facility-administered medications for this visit.    VITAL SIGNS: BP 107/72 (BP Location: Left Arm, Patient Position: Sitting)   Pulse (!) 103   Temp 97.7 F (36.5 C) (Temporal)   Resp 18   Wt 180 lb 4.8 oz (81.8 kg)   SpO2 97%   BMI 23.79 kg/m  Filed Weights   05/17/24 1105  Weight: 180 lb 4.8 oz (81.8 kg)    Estimated body mass index is 23.79 kg/m as calculated from the following:   Height as of 05/16/24: 6' 1 (1.854 m).   Weight as of this encounter: 180 lb 4.8 oz (81.8 kg).    LABS:    Drugs of Abuse     Component Value Date/Time   LABOPIA NONE DETECTED 05/17/2024 1044   COCAINSCRNUR POSITIVE (A) 05/17/2024 1044   COCAINSCRNUR POS (A) 08/22/2006 1054   LABBENZ NONE DETECTED 05/17/2024 1044   LABBENZ NEG 08/22/2006 1054   AMPHETMU NONE DETECTED 05/17/2024 1044   THCU NONE DETECTED 05/17/2024 1044   LABBARB NONE DETECTED 05/17/2024 1044      CBC:    Component Value Date/Time   WBC 3.4 (L) 05/17/2024 1044   WBC 7.5 10/29/2021 0245   HGB 14.0 05/17/2024 1044   HGB 13.9 03/23/2023 1617   HCT 41.2 05/17/2024 1044   HCT 42.8 03/23/2023 1617   PLT 118 (L) 05/17/2024 1044   PLT 194 03/23/2023 1617   MCV 74.8 (L) 05/17/2024 1044   MCV 78 (L) 03/23/2023 1617   NEUTROABS 2.1 05/17/2024 1044   LYMPHSABS 0.4 (L) 05/17/2024 1044   MONOABS 0.4 05/17/2024 1044   EOSABS 0.1 05/17/2024 1044  EOSABS 0.1 K/UL 08/22/2006 1054   BASOSABS 0.1  05/17/2024 1044   Comprehensive Metabolic Panel:    Component Value Date/Time   NA 131 (L) 05/17/2024 1044   NA 139 01/05/2024 1421   K 3.5 05/17/2024 1044   CL 98 05/17/2024 1044   CO2 25 05/17/2024 1044   BUN 25 (H) 05/17/2024 1044   BUN 16 01/05/2024 1421   CREATININE 1.30 (H) 05/17/2024 1044   CREATININE 0.96 07/30/2020 1437   GLUCOSE 111 (H) 05/17/2024 1044   CALCIUM  6.7 (L) 05/17/2024 1044   AST 18 05/17/2024 1044   ALT 16 05/17/2024 1044   ALKPHOS 80 05/17/2024 1044   BILITOT 0.4 05/17/2024 1044   PROT 8.0 05/17/2024 1044   PROT 7.1 03/23/2023 1617   ALBUMIN 3.9 05/17/2024 1044   ALBUMIN 4.2 03/23/2023 1617    RADIOGRAPHIC STUDIES: US  FNA SALIVARY GLAND/PAROTID GLAND Result Date: 05/03/2024 CLINICAL DATA:  Mass of the right parotid gland EXAM: US  parotid gland FNA and CORE BIOPSY RIGHT COMPARISON:  Previous exam(s). PROCEDURE: I met with the patient and we discussed the procedure of ultrasound-guided biopsy, including benefits and alternatives. We discussed the high likelihood of a successful procedure. We discussed the risks of the procedure, including infection, bleeding, tissue injury, and inadequate sampling. Informed written consent was given. The usual time-out protocol was performed immediately prior to the procedure. Using sterile technique and 1% Lidocaine  as local anesthetic, under direct ultrasound visualization, a 14 gauge spring-loaded device was used to perform biopsy of the right parotid mass using a medial approach. Fine-needle aspiration was also performed by advancing the needle from the skin into the midpoint of the mass under ultrasound guidance. Each needle was advanced and aspirated with a 5 mL syringe and then given to cytopathology for processing. A total of 5 fine needle aspirations were performed. IMPRESSION: Ultrasound guided fine needle aspiration and core needle biopsy of the right parotid gland mass. Electronically Signed   By: Cordella Banner   On:  05/03/2024 15:42    PERFORMANCE STATUS (ECOG) : 1 - Symptomatic but completely ambulatory  Review of Systems Unless otherwise noted, a complete review of systems is negative.  Physical Exam Constitutional:      General: He is not in acute distress.    Appearance: He is not toxic-appearing.  Pulmonary:     Effort: Pulmonary effort is normal. No respiratory distress.     Breath sounds: No wheezing or rhonchi.  Skin:    General: Skin is warm and dry.  Neurological:     Mental Status: He is alert.  Psychiatric:        Behavior: Behavior normal.      IMPRESSION:  I introduced myself, Nikki NP, Maygan RN, and Palliative's role in collaboration with the oncology team. Concept of Palliative Care was introduced as specialized medical care for people and their families living with serious illness.  It focuses on providing relief from the symptoms and stress of a serious illness.  The goal is to improve quality of life for both the patient and the family. Values and goals of care important to patient and family were attempted to be elicited.  Discussed the use of AI scribe software for clinical note transcription with the patient, who gave verbal consent to proceed.  History of Present Illness Benjamin Herrera is a 58 year old male with metastatic lung cancer who presents for palliative care consultation for symptom management and GOC. He is accompanied by his sister, Dorothe.  He experiences increased fatigue and dyspnea since initiating treatment. He notices these symptoms when he is ambulating/exerting himself. He lives alone and continues to be able to care for himself independently although a bit slower than before.   He has difficulty sleeping at night and trouble falling asleep. He naps during the day and then has trouble sleeping at night.   He has left shoulder pain related to a bone metastasis, which he reports has improved since radiation treatment. Previously, the pain radiated  through his whole arm but is now from the elbow up. He rates the pain as 3-4 out of 10 when sitting still. He takes oxycodone , which eases the pain. Feels his pain control is reasonable at this point.   He experiences constipation, which he attributes to his pain medication. He has not had a bowel movement in the past few days.   His appetite is poor and he has lost weight. He struggles with eating due to swallowing difficulties  (chronic issues previously dx with achalasia). He consumes one Boost drink per day and would drink more but they are expensive.  He has a history of cocaine, tobacco, and THC use, which he states he has not used any in over a month. However, a recent urine test showed positive for cocaine.   We discussed his non-curable metastatic lung cancer current illness and what it means in the larger context of his health and on-going co-morbidities. Natural disease trajectory and expectations were discussed.  I discussed the importance of continued conversation with family and their medical providers regarding overall plan of care and treatment options, ensuring decisions are within the context of the patients values and GOCs.  Established therapeutic relationship. Education provided on palliative's role in collaboration with their Oncology/Radiation team.  Assessment & Plan Cancer-related fatigue and shortness of breath Fatigue and dyspnea likely due to treatment effects, with symptoms of increased tiredness and exertional dyspnea. -frequent rest breaks as needed, remain as functional as possible to maintain mobility  Pain in shoulder and arm due to cancer (mets to bone) -Pain improved post radiation rated 3-4/10 at rest. Managed with oxycodone . - Continue oxycodone  for pain management, due for refill 7/27, will renew - Monitor pain levels and adjust treatment as needed - Discussed realistic pain management goals, aiming for improved quality of life and functionality rather  than complete pain elimination - Pain contract reviewed, acknowledged, and signed  Constipation due to opioid use Constipation likely secondary to oxycodone  use, with no bowel movement for several days. - Start Miralax, one capful daily, increase to twice daily if needed - Consider adding Senna if Miralax is insufficient - Encourage increased fluid intake and dietary fiber - Provide information on obtaining Miralax and Senna at reduced cost from [place] pharmacy  Unintentional weight loss/decreased appetite Significant weight loss, likely multifactorial due to decreased appetite, active cancer dx, and treatment effects. - Encourage small, frequent meals high in protein - Consider nutritional supplements like Boost or protein powders - Referral to nutritionist for further evaluation and management, appointment on August 20,will message RD about moving up  Difficulty sleeping Difficulty falling asleep, possibly exacerbated by daytime napping and treatment-related fatigue. - Advise limiting daytime naps and increasing activity levels during the day to improve nighttime sleep  Cocaine use Positive urine drug test for cocaine despite reported cessation over a month ago. Long history of use but claims recent abstinence. - Advise on the importance of abstaining from cocaine use - Discuss potential impact on pain  management and treatment eligibility - Plan to retest at next appointment  Goals of Care Desires to prolong life and continue aggressive treatment. Has not completed advanced directives desires full code. - Plans to meet with SW upcoming and complete advanced directives/living will/HCPOA (plans to name his sister as health proxy) - Encourage discussion of goals and preferences with family and healthcare team     Patient expressed understanding and was in agreement with this plan. He also understands that He can call the clinic at any time with any questions, concerns, or  complaints.   Thank you for your referral and allowing Palliative to assist in Mr. Delores Thelen Lejeune's care.   Number and complexity of problems addressed: HIGH - 1 or more chronic illnesses with SEVERE exacerbation, progression, or side effects of treatment - advanced cancer, pain. Any controlled substances utilized were prescribed in the context of palliative care.  Visit consisted of counseling and education dealing with the complex and emotionally intense issues of symptom management and palliative care in the setting of serious and potentially life-threatening illness.  Signed by: Laymon Pinal, DNP, AGNP-c, [facility] Levon Borer, AGPCNP-BC Palliative Medicine Team/[facility]

## 2024-05-17 NOTE — Progress Notes (Signed)
 Patient brought documents to complete grant process. Patient approved for one-time $1000 [employer] grant to assist with personal expenses while going through treatment. He completed grant paperwork and received a copy of the approval letter and expense sheet in green folder to review and call me at earliest convenience. He received a gas card today from grant.

## 2024-05-17 NOTE — Progress Notes (Signed)
 [facility] CSW Progress Note  Clinical Child psychotherapist contacted patient by phone to follow-up on referral for advanced directives clinic.    Interventions: Pt requested to sign up for the advanced directives clinic on August 11th at 2:00.  Pt scheduled and a packet will be mailed prior to clinic.        Follow Up Plan:  Patient will contact CSW with any support or resource needs    Devere JONELLE Manna, LCSW Clinical Social Worker [facility]    Patient is participating in a Managed Medicaid Plan:  Yes

## 2024-05-20 NOTE — Progress Notes (Unsigned)
 [facility] OFFICE PROGRESS NOTE  [name], MD [address] [place] [address] [place] [address]  DIAGNOSIS: Stage IV (T3, N3, M1 C) non-small cell lung cancer, adenocarcinoma presented with large right upper lobe lung mass in addition to right hilar and mediastinal as well as right supraclavicular lymphadenopathy and metastatic bone lesions including the left proximal humerus, L2 as well as sacral area and left adrenal metastasis diagnosed in June 2025.   Molecular studies show PDL1 of 0%  Biomarker Findings Blood Tumor Mutational Burden - 37 Muts/Mb ctDNA Tumor Fraction - High (2.4%) Microsatellite status - MSI-High Not Detected Genomic Findings For a complete list of the genes assayed, please refer to the Appendix. ARID1A E2250* KEAP1 G524C, G491fs*23 CTNNA1 splice site 2192+1G>C DNMT3A R882C TET2 K1197* TP53 splice site 813_[id number], deletion exons 8-9 This assay tested >300 cancer-related genes, including the following 8 gene(s) routinely assessed in this tumor type: ALK, BRAF, EGFR, ERBB2, KRAS, MET, RET, ROS1.   PRIOR THERAPY:  Palliative radiation to the painful bone lesion in the left humerus. Last day on 05/03/24.   CURRENT THERAPY:  Systemic chemotherapy with Carboplatin  for an AUC of 5, Alimta  500 mg/m2, and Libatyo IV every 3 weeks. First dose on 05/02/24. Doses of Alimta  reduced to 400 mg/m2 starting from cycle #2 due to HIV and neutropenia  INTERVAL HISTORY: PHILLP DOLORES 58 y.o. male returns to the clinic today for a follow up visit accompanied by his sister. The patient was last seen in the clinic 2 weeks ago. He is status post his first cycle of treatment. He had neutropenia for which I arranged GCSF injections. He also had thrombocytopenia which did not require any supportive care. I discussed his doses with Dr. Sherrod as the patient has HIV and we will slightly reduce his dose of alimta  today.   He was seen fora 1 week follow up by my  colleague and had wound on axilla. Therefore he was prescribed antibiotics and his wound has improved. However, he has some skin darkening in the left axilla and shoulder due to radiation.  He uses aloe vera for relief and has significant itching, for which he takes hydroxyzine  (Atarax ) as needed. He also uses Benadryl  and hydrocortisone  cream for other itchy areas.  He also was supposed to see GI this month for dysphagia and was scheduled for esophageal manometry but he cancelled it due to being overwhelmed with multiple appointments.   He also saw cardiology, social work, and the financial advocate in the interval.   He has a decrease in appetite, which he attributes to chemotherapy. His appetite improves between treatments but decreases again with the next cycle. He struggles with swallowing and prefers soft foods like soup and stew. He is considering using protein powder in smoothies to increase his nutritional intake.  He mentions constipation, having not had a bowel movement in about a week. He uses Miralax without success and plans to obtain additional laxatives. He has a history of hemorrhoids and is concerned about exacerbating this condition.  He experiences shortness of breath with exertion. No cough, fever, chills, night sweats, nausea, vomiting, diarrhea, or chest pain. He mentions a past issue with a 'dirty' urine test, which he attributes to his history of drug use, now abstinent for almost two months. He is seeing palliative care later today.   He is concerned about a potential referral to an eye doctor and is unsure of the reason for this appointment. He is scheduled to see  an orthopedic oncologist tomorrow for his shoulder.   He is here today for evaluation and repeat blood work before undergoing cycle #2.  MEDICAL HISTORY: Past Medical History:  Diagnosis Date   Achalasia and cardiospasm    Allergy    Anxiety    Arthritis    back ,shoulders    Asthma    Boils    under  arms hx of   CAD (coronary artery disease)    a. 09/2016: cath showing 100% RCA occlusion with collaterals and 90% OM1 (treated with DES)   Candidiasis of mouth    Candidiasis of unspecified site    Cholelithiasis    Cocaine abuse (HCC)    Quit 09/2016   Condyloma acuminatum in male of scrotum & anal canal s/p laser ablation 07/03/2012   Coronary artery disease    Dysphagia    Fracture, humerus    Frozen shoulder    left   GERD (gastroesophageal reflux disease)    Heart attack (HCC)    Hemorrhoids    Hepatitis 1993   A   Herpes labialis    HIV (human immunodeficiency virus infection) (HCC) DX 1993   Hypertension Dx 2014   off all bp meds for last 9 or 10 months   Pilonidal disease 01/10/2012   Polysubstance abuse (HCC)    Psoriasis    hx of   Sleep apnea    does  have CPAP   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  MEDICATIONS:  Current Outpatient Medications  Medication Sig Dispense Refill   acetaminophen  (TYLENOL ) 325 MG tablet Take 650 mg by mouth every 6 (six) hours as needed for mild pain (pain score 1-3), moderate pain (pain score 4-6) or headache.     albuterol  (VENTOLIN  HFA) 108 (90 Base) MCG/ACT inhaler Inhale 2 puffs into the lungs every 6 (six) hours as needed for wheezing or shortness of breath. 1 each 2   amLODipine  (NORVASC ) 5 MG tablet TAKE 1 TABLET (5 MG TOTAL) BY MOUTH DAILY. 30 tablet 0   amoxicillin -clavulanate (AUGMENTIN ) 875-125 MG tablet Take 1 tablet by mouth 2 (two) times daily. 20 tablet 0   aspirin  81 MG chewable tablet Chew 1 tablet (81 mg total) by mouth daily. 90 tablet 3   Bictegravir-Emtricitab-Tenofov (BIKTARVY ) 30-120-15 MG TABS Take 1 tablet by mouth daily. 90 tablet 3   Blood Pressure KIT 1 kit by Does not apply route daily. 1 kit 0   diclofenac  Sodium (VOLTAREN  ARTHRITIS PAIN) 1 %  GEL Apply 2 g topically 4 (four) times daily. 100 g 3   folic acid  (FOLVITE ) 1 MG tablet Take 1 tablet (1 mg total) by mouth daily. Start 7 days before pemetrexed  chemotherapy. Continue until 21 days after pemetrexed  completed. 100 tablet 3   HYDROcodone -acetaminophen  (NORCO/VICODIN) 5-325 MG tablet Take 1 tablet by mouth every 6 (six) hours as needed.     hydrOXYzine  (ATARAX ) 25 MG tablet Take 1 tablet (25 mg total) by mouth 3 (three) times daily as needed. 30 tablet 0   lidocaine  (XYLOCAINE ) 2 % solution SWISH AND SWALLOW 5 MLS 3 (THREE) TIMES DAILY AS NEEDED FOR MOUTH PAIN. 360 mL 1   lidocaine -prilocaine  (EMLA ) cream Apply to affected area once 30 g 3   LORazepam  (ATIVAN ) 0.5 MG tablet Take 1 tablet (0.5 mg total) by mouth as needed for  anxiety. Take 30 min prior to scan and have a ride arranged from the scan. 1 tablet 0   magic mouthwash (nystatin , lidocaine , diphenhydrAMINE , alum & mag hydroxide) suspension Swish and swallow 5 mLs 3 (three) times daily as needed for mouth pain. 180 mL 2   methocarbamol  (ROBAXIN ) 500 MG tablet Take 2 tablets (1,000 mg total) by mouth every 8 (eight) hours as needed. 90 tablet 1   metoprolol  succinate (TOPROL -XL) 25 MG 24 hr tablet TAKE 1/2 TABLET BY MOUTH DAILY 45 tablet 0   nitroGLYCERIN  (NITROSTAT ) 0.4 MG SL tablet Place 1 tablet (0.4 mg total) under the tongue every 5 (five) minutes as needed for chest pain (trouble swallowing). 25 tablet 1   ondansetron  (ZOFRAN ) 8 MG tablet Take 1 tablet (8 mg total) by mouth every 8 (eight) hours as needed for nausea or vomiting. Start on the third day after carboplatin . 30 tablet 1   oxyCODONE -acetaminophen  (PERCOCET/ROXICET) 5-325 MG tablet Take 1 tablet by mouth every 8 (eight) hours as needed for severe pain (pain score 7-10). 30 tablet 0   prochlorperazine  (COMPAZINE ) 10 MG tablet Take 1 tablet (10 mg total) by mouth every 6 (six) hours as needed for nausea or vomiting. 30 tablet 1   No current facility-administered  medications for this visit.    SURGICAL HISTORY:  Past Surgical History:  Procedure Laterality Date   BALLOON DILATION N/A 11/11/2015   Procedure: BALLOON DILATION;  Surgeon: Gustav Shila GAILS, MD;  Location: [facility];  Service: Endoscopy;  Laterality: N/A;   BOTOX  INJECTION  10/11/2012   Procedure: BOTOX  INJECTION;  Surgeon: Lamar JONETTA Aho, MD;  Location: [facility];  Service: Endoscopy;  Laterality: N/A;   BOTOX  INJECTION N/A 01/25/2013   Procedure: BOTOX  INJECTION;  Surgeon: Lamar JONETTA Aho, MD;  Location: [facility];  Service: Endoscopy;  Laterality: N/A;   BOTOX  INJECTION N/A 06/21/2013   Procedure: BOTOX  INJECTION;  Surgeon: Lamar JONETTA Aho, MD;  Location: [facility];  Service: Endoscopy;  Laterality: N/A;   BOTOX  INJECTION N/A 10/08/2013   Procedure: BOTOX  INJECTION;  Surgeon: Lamar JONETTA Aho, MD;  Location: [facility];  Service: Endoscopy;  Laterality: N/A;   BOTOX  INJECTION N/A 06/16/2015   Procedure: BOTOX  INJECTION;  Surgeon: Lamar JONETTA Aho, MD;  Location: [facility];  Service: Endoscopy;  Laterality: N/A;   BRONCHIAL BIOPSY  04/03/2024   Procedure: BRONCHOSCOPY, WITH BIOPSY;  Surgeon: Shelah Lamar RAMAN, MD;  Location: [facility] END[facility]COPY;  Service: Pulmonary;;   BRONCHIAL BRUSHINGS  04/03/2024   Procedure: BRONCHOSCOPY, WITH BRUSH BIOPSY;  Surgeon: Shelah Lamar RAMAN, MD;  Location: [facility];  Service: Pulmonary;;   BRONCHIAL NEEDLE ASPIRATION BIOPSY  04/03/2024   Procedure: BRONCHOSCOPY, WITH NEEDLE ASPIRATION BIOPSY;  Surgeon: Shelah Lamar RAMAN, MD;  Location: [facility];  Service: Pulmonary;;   BRONCHOSCOPY, WITH BIOPSY USING ELECTROMAGNETIC NAVIGATION Right 04/03/2024   Procedure: BRONCHOSCOPY, WITH BIOPSY USING ELECTROMAGNETIC NAVIGATION;  Surgeon: Shelah Lamar RAMAN, MD;  Location: [facility];  Service: Pulmonary;  Laterality: Right;   CARDIAC CATHETERIZATION N/A 10/11/2016   Procedure: Left Heart Cath and Coronary Angiography;  Surgeon: Lonni JONETTA Cash, MD;  mRCA  100% w/ L>R collaterals, OM1 90%   CARDIAC CATHETERIZATION N/A 10/11/2016   Procedure: Coronary Stent Intervention;  Surgeon: Lonni JONETTA Cash, MD;  RESOLUTE ONYX 3.5X15 DES OM1   COLONOSCOPY  05/19/2012   Procedure: COLONOSCOPY;  Surgeon: Lamar JONETTA Aho, MD;  Location: [facility];  Service: Endoscopy;  Laterality: N/A;  jill trying to contact pt to come in 0830  for 930 case, phone not accepting messages   COLONOSCOPY     ESOPHAGEAL MANOMETRY  05/29/2012   Procedure: ESOPHAGEAL MANOMETRY (EM);  Surgeon: Lamar JONETTA Aho, MD;  Location: [facility];  Service: Endoscopy;  Laterality: N/A;   ESOPHAGEAL MANOMETRY N/A 10/06/2015   Procedure: ESOPHAGEAL MANOMETRY (EM);  Surgeon: Gustav Shila GAILS, MD;  Location: [facility];  Service: Endoscopy;  Laterality: N/A;   ESOPHAGOGASTRODUODENOSCOPY  11/11/2011   Procedure: ESOPHAGOGASTRODUODENOSCOPY (EGD);  Surgeon: Lamar JONETTA Aho, MD;  Location: [facility];  Service: Endoscopy;  Laterality: N/A;  botox  injection  called Pt to change time of procedure per Dr Aho   ESOPHAGOGASTRODUODENOSCOPY  03/10/2012   Procedure: ESOPHAGOGASTRODUODENOSCOPY (EGD);  Surgeon: Lamar JONETTA Aho, MD;  Location: [facility];  Service: Endoscopy;  Laterality: N/A;   ESOPHAGOGASTRODUODENOSCOPY  10/11/2012   Procedure: ESOPHAGOGASTRODUODENOSCOPY (EGD);  Surgeon: Lamar JONETTA Aho, MD;  Location: [facility];  Service: Endoscopy;  Laterality: N/A;   ESOPHAGOGASTRODUODENOSCOPY N/A 01/25/2013   Procedure: ESOPHAGOGASTRODUODENOSCOPY (EGD);  Surgeon: Lamar JONETTA Aho, MD;  Location: [facility];  Service: Endoscopy;  Laterality: N/A;   ESOPHAGOGASTRODUODENOSCOPY N/A 06/21/2013   Procedure: ESOPHAGOGASTRODUODENOSCOPY (EGD);  Surgeon: Lamar JONETTA Aho, MD;  Location: [facility];  Service: Endoscopy;  Laterality: N/A;   ESOPHAGOGASTRODUODENOSCOPY (EGD) WITH PROPOFOL  N/A 10/08/2013   Procedure: ESOPHAGOGASTRODUODENOSCOPY (EGD) WITH PROPOFOL ;  Surgeon: Lamar JONETTA Aho, MD;   Location: [facility];  Service: Endoscopy;  Laterality: N/A;   ESOPHAGOGASTRODUODENOSCOPY (EGD) WITH PROPOFOL  N/A 06/16/2015   Procedure: ESOPHAGOGASTRODUODENOSCOPY (EGD) WITH PROPOFOL ;  Surgeon: Lamar JONETTA Aho, MD;  Location: [facility];  Service: Endoscopy;  Laterality: N/A;   ESOPHAGOGASTRODUODENOSCOPY (EGD) WITH PROPOFOL  N/A 11/11/2015   Procedure: ESOPHAGOGASTRODUODENOSCOPY (EGD) WITH PROPOFOL ;  Surgeon: Gustav Shila GAILS, MD;  Location: [facility];  Service: Endoscopy;  Laterality: N/A;   EXAMINATION UNDER ANESTHESIA  07/24/2012   Procedure: EXAM UNDER ANESTHESIA;  Surgeon: Elspeth KYM Schultze, MD;  Location: [facility];  Service: General;  Laterality: N/A;   humeral fracture surgery Right yrs ago   PILONIDAL CYST EXCISION  07/24/2012   Procedure: CYST EXCISION PILONIDAL SIMPLE;  Surgeon: Elspeth KYM Schultze, MD;  Location: [facility];  Service: General;  Laterality: N/A;  Exam Under Anesthesia,, Excision Pilonidal Disease,    right shoulder replacement  2008   x 2   UPPER GASTROINTESTINAL ENDOSCOPY     WART FULGURATION  07/24/2012   Procedure: FULGURATION ANAL WART;  Surgeon: Elspeth KYM Schultze, MD;  Location: [facility];  Service: General;  Laterality: N/A;  excision of raphe mass    REVIEW OF SYSTEMS:   Review of Systems  Constitutional: Positive for fatigue, appetite change, weight loss.  Negative for  chills and fever.  HENT: Positive for dysphagia. Negative for mouth sores, nosebleeds, and sore throat.  Eyes: Negative for eye problems and icterus.  Respiratory: Positive for intermittent dyspnea on exertion. Negative for cough, hemoptysis,  and wheezing.   Cardiovascular: Negative for chest pain and leg swelling.  Gastrointestinal: Positive for constipation. Negative for abdominal pain,  diarrhea, nausea and vomiting.  Genitourinary: Negative for bladder incontinence, difficulty urinating, dysuria, frequency and hematuria.   Musculoskeletal: Positive for left arm pain and decreased ROM. Negative for   gait problem, neck pain and neck stiffness.  Skin: Positive for subcutaneous nodules in left chest/axilla and skin darkening in left shoulder and axilla.  Neurological: Negative for dizziness, extremity weakness, gait problem, headaches, light-headedness and seizures.  Hematological: Negative for adenopathy. Does not bruise/bleed easily.  Psychiatric/Behavioral: Negative for confusion, depression and sleep  disturbance. The patient is not nervous/anxious.     PHYSICAL EXAMINATION:  There were no vitals taken for this visit.  ECOG PERFORMANCE STATUS: 1  Physical Exam  Constitutional: Oriented to person, place, and time and well-developed, well-nourished, and in no distress.  HENT:  Head: Normocephalic and atraumatic.  Mouth/Throat: Oropharynx is clear and moist. No oropharyngeal exudate.  Eyes: Conjunctivae are normal. Right eye exhibits no discharge. Left eye exhibits no discharge. No scleral icterus.  Neck: Normal range of motion. Neck supple.  Cardiovascular: Normal rate, regular rhythm, normal heart sounds and intact distal pulses.   Pulmonary/Chest: Effort normal and breath sounds normal. No respiratory distress. No wheezes. No rales.  Abdominal: Soft. Bowel sounds are normal. Exhibits no distension and no mass. There is no tenderness.  Musculoskeletal: Arm sling left arm. Decreased ROM of the left arm. Exhibits no edema.  Lymphadenopathy:    Left axillary subcutaneous nodules. Neurological: Alert and oriented to person, place, and time. Exhibits normal muscle tone. Gait normal. Coordination normal.  Skin: Skin is warm and dry. Positive for skin darkening in left shoulder and axilla.  Not diaphoretic. No erythema. No pallor.  Psychiatric: Mood, memory and judgment normal.  Vitals reviewed.  LABORATORY DATA: Lab Results  Component Value Date   WBC 3.4 (L) 05/17/2024   HGB 14.0 05/17/2024   HCT 41.2 05/17/2024   MCV 74.8 (L) 05/17/2024   PLT 118 (L) 05/17/2024      Chemistry       Component Value Date/Time   NA 131 (L) 05/17/2024 1044   NA 139 01/05/2024 1421   K 3.5 05/17/2024 1044   CL 98 05/17/2024 1044   CO2 25 05/17/2024 1044   BUN 25 (H) 05/17/2024 1044   BUN 16 01/05/2024 1421   CREATININE 1.30 (H) 05/17/2024 1044   CREATININE 0.96 07/30/2020 1437      Component Value Date/Time   CALCIUM  6.7 (L) 05/17/2024 1044   ALKPHOS 80 05/17/2024 1044   AST 18 05/17/2024 1044   ALT 16 05/17/2024 1044   BILITOT 0.4 05/17/2024 1044       RADIOGRAPHIC STUDIES:  US  FNA SALIVARY GLAND/PAROTID GLAND Result Date: 05/03/2024 CLINICAL DATA:  Mass of the right parotid gland EXAM: US  parotid gland FNA and CORE BIOPSY RIGHT COMPARISON:  Previous exam(s). PROCEDURE: I met with the patient and we discussed the procedure of ultrasound-guided biopsy, including benefits and alternatives. We discussed the high likelihood of a successful procedure. We discussed the risks of the procedure, including infection, bleeding, tissue injury, and inadequate sampling. Informed written consent was given. The usual time-out protocol was performed immediately prior to the procedure. Using sterile technique and 1% Lidocaine  as local anesthetic, under direct ultrasound visualization, a 14 gauge spring-loaded device was used to perform biopsy of the right parotid mass using a medial approach. Fine-needle aspiration was also performed by advancing the needle from the skin into the midpoint of the mass under ultrasound guidance. Each needle was advanced and aspirated with a 5 mL syringe and then given to cytopathology for processing. A total of 5 fine needle aspirations were performed. IMPRESSION: Ultrasound guided fine needle aspiration and core needle biopsy of the right parotid gland mass. Electronically Signed   By: Cordella Banner   On: 05/03/2024 15:42     ASSESSMENT/PLAN:  This is a very pleasant 58 years old African-American male recently diagnosed with a stage IV (T3, N3, M1 C) non-small  cell lung cancer, adenocarcinoma presented with large right upper lobe lung  mass in addition to right hilar and mediastinal as well as right supraclavicular lymphadenopathy and metastatic bone lesions including the left proximal humerus, L2 as well as sacral area and left adrenal metastasis diagnosed in June 2025.  His molecular studies show no actionable mutations. He has high TMB. 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  The patient was seen with Dr. Sherrod today. Due to his HIV and neutropenia, we will reduce the dose of alimta  to 400 mg/m2.     He completed palliative radiation to the humerus. The last day of radiation was on 05/03/24. We have reached out to him and got him cream to apply on his skin burns.   We will monitor his WBC and ANC closely. If his ANC is <0.6 we will arrange for GCSF injections.   Labs were reviewed.  He will proceed with cycle #2 today scheduled.  He is scheduled to talk to palliative care today.   Insomnia Insomnia possibly due to pruritus and treatment side effects. Uses Benadryl  for sleep. - Use Benadryl  at night for sleep if needed.  Generalized pruritus Generalized pruritus managed with hydroxyzine  and Benadryl . Persistent itching, especially in radiation areas. - Continue hydroxyzine  or Benadryl . He knows not to take both.  - Consider Claritin if sedation problematic with benadryl  or hydroxyzine .  Constipation secondary to cancer treatment and medications Constipation likely due to treatment and medications. Miralax ineffective. Concerns about hemorrhoids. - Take laxatives daily until bowel movement. - Use stool softeners like Colace and Senna daily.  Cancer-related dysphagia and anorexia with weight loss Dysphagia and anorexia causing weight loss. Difficulty swallowing and lack of appetite post-chemotherapy. - Encourage protein-rich smoothies.   Consult surgical oncologist for shoulder mobility. - Clarified ophthalmologist referral purpose and travel  necessity.  I refilled his magic mouthwash to the [facility] pharmacy. He will talk to social work about grants to help pay for his medication.    The patient was advised to call immediately if he has any concerning symptoms in the interval. The patient voices understanding of current disease status and treatment options and is in agreement with the current care plan. All questions were answered. The patient knows to call the clinic with any problems, questions or concerns. We can certainly see the patient much sooner if necessary  No orders of the defined types were placed in this encounter.   Hortensia Duffin L Jazzie Trampe, PA-C 05/20/24  ADDENDUM: Hematology/Oncology Attending:  I had a face-to-face encounter with the patient today.  I reviewed his records, lab and recommended his care plan.  This is a 58 years old white male diagnosed with a stage IV non-small cell lung cancer, adenocarcinoma in June 2025.  The patient has no actionable mutations and negative PD-L1 expression.  He also has a history of HIV currently on treatment for this condition.  He started palliative systemic chemotherapy with carboplatin  for AUC of 5, Alimta  500 Mg/M2 on Libtayo  (Cempilimab) 350 Mg IV every 3 weeks status post 1 cycle.  He tolerated the first cycle of his treatment fairly well except for pancytopenia. He is feeling much better today.  I recommended for him to proceed with cycle #2 but I will reduce the dose of Alimta  to 400 Mg/M2 starting from cycle #2 because of the neutropenia and HIV status. We will see the patient back for follow-up visit in 3 weeks for evaluation before the next cycle of his treatment. He was advised to call immediately if he has any other concerning symptoms in the interval. The total  time spent in the appointment was 30 minutes including review of chart and various tests results, discussions about plan of care and coordination of care plan . Disclaimer: This note was dictated with voice  recognition software. Similar sounding words can inadvertently be transcribed and may be missed upon review. Sherrod MARLA Sherrod, MD

## 2024-05-21 DIAGNOSIS — R6889 Other general symptoms and signs: Secondary | ICD-10-CM | POA: Diagnosis not present

## 2024-05-22 ENCOUNTER — Other Ambulatory Visit: Payer: Self-pay

## 2024-05-22 ENCOUNTER — Telehealth: Payer: Self-pay | Admitting: Family Medicine

## 2024-05-22 DIAGNOSIS — R6889 Other general symptoms and signs: Secondary | ICD-10-CM | POA: Diagnosis not present

## 2024-05-22 DIAGNOSIS — C3491 Malignant neoplasm of unspecified part of right bronchus or lung: Secondary | ICD-10-CM

## 2024-05-22 DIAGNOSIS — Z515 Encounter for palliative care: Secondary | ICD-10-CM

## 2024-05-22 DIAGNOSIS — R918 Other nonspecific abnormal finding of lung field: Secondary | ICD-10-CM

## 2024-05-22 MED FILL — Fosaprepitant Dimeglumine For IV Infusion 150 MG (Base Eq): INTRAVENOUS | Qty: 5 | Status: AC

## 2024-05-22 NOTE — Telephone Encounter (Signed)
 Copied from CRM [id number]. Topic: Clinical - Medication Question >> May 22, 2024 10:36 AM Delon DASEN wrote:  Reason for CRM: [facility] delivered for incontinence supplies- faxed twice the forms for supplies- last form returned was incomplete- [phone]

## 2024-05-23 ENCOUNTER — Inpatient Hospital Stay (HOSPITAL_BASED_OUTPATIENT_CLINIC_OR_DEPARTMENT_OTHER): Admitting: Physician Assistant

## 2024-05-23 ENCOUNTER — Inpatient Hospital Stay

## 2024-05-23 ENCOUNTER — Other Ambulatory Visit (HOSPITAL_COMMUNITY): Payer: Self-pay

## 2024-05-23 ENCOUNTER — Inpatient Hospital Stay (HOSPITAL_BASED_OUTPATIENT_CLINIC_OR_DEPARTMENT_OTHER): Admitting: Nurse Practitioner

## 2024-05-23 ENCOUNTER — Other Ambulatory Visit: Payer: Self-pay | Admitting: Radiology

## 2024-05-23 ENCOUNTER — Encounter: Payer: Self-pay | Admitting: Nurse Practitioner

## 2024-05-23 ENCOUNTER — Ambulatory Visit: Admitting: Nutrition

## 2024-05-23 VITALS — BP 100/66 | HR 102 | Temp 98.2°F | Resp 18 | Ht 73.0 in | Wt 177.0 lb

## 2024-05-23 DIAGNOSIS — R1319 Other dysphagia: Secondary | ICD-10-CM

## 2024-05-23 DIAGNOSIS — C3411 Malignant neoplasm of upper lobe, right bronchus or lung: Secondary | ICD-10-CM | POA: Diagnosis not present

## 2024-05-23 DIAGNOSIS — R918 Other nonspecific abnormal finding of lung field: Secondary | ICD-10-CM

## 2024-05-23 DIAGNOSIS — C3491 Malignant neoplasm of unspecified part of right bronchus or lung: Secondary | ICD-10-CM

## 2024-05-23 DIAGNOSIS — G4700 Insomnia, unspecified: Secondary | ICD-10-CM | POA: Diagnosis not present

## 2024-05-23 DIAGNOSIS — G893 Neoplasm related pain (acute) (chronic): Secondary | ICD-10-CM | POA: Diagnosis not present

## 2024-05-23 DIAGNOSIS — R630 Anorexia: Secondary | ICD-10-CM

## 2024-05-23 DIAGNOSIS — K5900 Constipation, unspecified: Secondary | ICD-10-CM | POA: Diagnosis not present

## 2024-05-23 DIAGNOSIS — Z5112 Encounter for antineoplastic immunotherapy: Secondary | ICD-10-CM | POA: Insufficient documentation

## 2024-05-23 DIAGNOSIS — R6889 Other general symptoms and signs: Secondary | ICD-10-CM | POA: Diagnosis not present

## 2024-05-23 DIAGNOSIS — Z5111 Encounter for antineoplastic chemotherapy: Secondary | ICD-10-CM | POA: Insufficient documentation

## 2024-05-23 DIAGNOSIS — Z515 Encounter for palliative care: Secondary | ICD-10-CM

## 2024-05-23 DIAGNOSIS — Z510 Encounter for antineoplastic radiation therapy: Secondary | ICD-10-CM | POA: Diagnosis not present

## 2024-05-23 DIAGNOSIS — C7951 Secondary malignant neoplasm of bone: Secondary | ICD-10-CM | POA: Diagnosis not present

## 2024-05-23 DIAGNOSIS — F40240 Claustrophobia: Secondary | ICD-10-CM

## 2024-05-23 LAB — CBC WITH DIFFERENTIAL (CANCER CENTER ONLY)
Abs Immature Granulocytes: 0.02 K/uL (ref 0.00–0.07)
Basophils Absolute: 0 K/uL (ref 0.0–0.1)
Basophils Relative: 1 %
Eosinophils Absolute: 0 K/uL (ref 0.0–0.5)
Eosinophils Relative: 2 %
HCT: 39.1 % (ref 39.0–52.0)
Hemoglobin: 13.3 g/dL (ref 13.0–17.0)
Immature Granulocytes: 1 %
Lymphocytes Relative: 17 %
Lymphs Abs: 0.4 K/uL — ABNORMAL LOW (ref 0.7–4.0)
MCH: 25.1 pg — ABNORMAL LOW (ref 26.0–34.0)
MCHC: 34 g/dL (ref 30.0–36.0)
MCV: 73.9 fL — ABNORMAL LOW (ref 80.0–100.0)
Monocytes Absolute: 0.4 K/uL (ref 0.1–1.0)
Monocytes Relative: 14 %
Neutro Abs: 1.7 K/uL (ref 1.7–7.7)
Neutrophils Relative %: 65 %
Platelet Count: 300 K/uL (ref 150–400)
RBC: 5.29 MIL/uL (ref 4.22–5.81)
RDW: 14.6 % (ref 11.5–15.5)
WBC Count: 2.5 K/uL — ABNORMAL LOW (ref 4.0–10.5)
nRBC: 0 % (ref 0.0–0.2)

## 2024-05-23 LAB — RAPID URINE DRUG SCREEN, HOSP PERFORMED
Amphetamines: NOT DETECTED
Barbiturates: NOT DETECTED
Benzodiazepines: NOT DETECTED
Cocaine: NOT DETECTED
Opiates: NOT DETECTED
Tetrahydrocannabinol: NOT DETECTED

## 2024-05-23 LAB — CMP (CANCER CENTER ONLY)
ALT: 17 U/L (ref 0–44)
AST: 21 U/L (ref 15–41)
Albumin: 3.8 g/dL (ref 3.5–5.0)
Alkaline Phosphatase: 84 U/L (ref 38–126)
Anion gap: 10 (ref 5–15)
BUN: 27 mg/dL — ABNORMAL HIGH (ref 6–20)
CO2: 24 mmol/L (ref 22–32)
Calcium: 6.7 mg/dL — ABNORMAL LOW (ref 8.9–10.3)
Chloride: 98 mmol/L (ref 98–111)
Creatinine: 1.29 mg/dL — ABNORMAL HIGH (ref 0.61–1.24)
GFR, Estimated: >60 mL/min (ref >60)
Glucose, Bld: 114 mg/dL — ABNORMAL HIGH (ref 70–99)
Potassium: 3.6 mmol/L (ref 3.5–5.1)
Sodium: 132 mmol/L — ABNORMAL LOW (ref 135–145)
Total Bilirubin: 0.4 mg/dL (ref 0.0–1.2)
Total Protein: 8.3 g/dL — ABNORMAL HIGH (ref 6.5–8.1)

## 2024-05-23 MED ORDER — SODIUM CHLORIDE 0.9 % IV SOLN
515.5000 mg | Freq: Once | INTRAVENOUS | Status: AC
  Administered 2024-05-23: 520 mg via INTRAVENOUS
  Filled 2024-05-23: qty 52

## 2024-05-23 MED ORDER — SODIUM CHLORIDE 0.9% FLUSH
10.0000 mL | INTRAVENOUS | Status: DC | PRN
Start: 2024-05-23 — End: 2024-05-23

## 2024-05-23 MED ORDER — NYSTATIN 100000 UNIT/ML MT SUSP
5.0000 mL | Freq: Three times a day (TID) | OROMUCOSAL | 1 refills | Status: DC | PRN
  Filled 2024-05-23: qty 240, 14d supply, fill #0
  Filled 2024-06-01: qty 240, 16d supply, fill #0

## 2024-05-23 MED ORDER — LORAZEPAM 0.5 MG PO TABS: 0.5000 mg | ORAL_TABLET | ORAL | 0 refills | Status: DC | PRN

## 2024-05-23 MED ORDER — SODIUM CHLORIDE 0.9 % IV SOLN
350.0000 mg | Freq: Once | INTRAVENOUS | Status: AC
  Administered 2024-05-23: 350 mg via INTRAVENOUS
  Filled 2024-05-23: qty 7

## 2024-05-23 MED ORDER — HEPARIN SOD (PORK) LOCK FLUSH 100 UNIT/ML IV SOLN: 500.0000 [IU] | Freq: Once | INTRAVENOUS | Status: DC | PRN

## 2024-05-23 MED ORDER — SODIUM CHLORIDE 0.9 % IV SOLN: INTRAVENOUS | Status: DC

## 2024-05-23 MED ORDER — FOSAPREPITANT DIMEGLUMINE INJECTION 150 MG
150.0000 mg | Freq: Once | INTRAVENOUS | Status: AC
  Administered 2024-05-23: 150 mg via INTRAVENOUS
  Filled 2024-05-23: qty 150

## 2024-05-23 MED ORDER — DEXAMETHASONE SODIUM PHOSPHATE 10 MG/ML IJ SOLN
10.0000 mg | Freq: Once | INTRAMUSCULAR | Status: AC
  Administered 2024-05-23: 10 mg via INTRAVENOUS
  Filled 2024-05-23: qty 1

## 2024-05-23 MED ORDER — SODIUM CHLORIDE 0.9 % IV SOLN
400.0000 mg/m2 | Freq: Once | INTRAVENOUS | Status: AC
  Administered 2024-05-23: 800 mg via INTRAVENOUS
  Filled 2024-05-23: qty 20

## 2024-05-23 MED ORDER — PALONOSETRON HCL INJECTION 0.25 MG/5ML
0.2500 mg | Freq: Once | INTRAVENOUS | Status: AC
  Administered 2024-05-23: 0.25 mg via INTRAVENOUS
  Filled 2024-05-23: qty 5

## 2024-05-23 NOTE — Progress Notes (Signed)
 Patient lost his ativan . Sent in a new prescription for him today.

## 2024-05-23 NOTE — Telephone Encounter (Signed)
 [facility] has been called and informed to fax new copy of form for completion.

## 2024-05-23 NOTE — Progress Notes (Signed)
 Ok to adjust alimta  dose based on current BSA=2 per Dr Sherrod

## 2024-05-23 NOTE — Progress Notes (Signed)
 Palliative Medicine [facility]  Telephone:(336) [phone] [phone]   Name: Benjamin Herrera Date: 05/23/2024 MRN: [id number]  DOB: 1966/07/19  Patient Care Team: [name], MD as PCP - General (Family Medicine) Eben Reyes [name], MD as PCP - Infectious Diseases (Infectious Diseases) Mona Vinie [name], MD as PCP - Cardiology (Cardiology) Debrah Lamar [name], MD (Inactive) as Consulting Physician (Gastroenterology) [name], MD as Consulting Physician (General Surgery) [name], RN as Oncology Nurse Navigator    INTERVAL HISTORY: Benjamin Herrera is a 58 y.o. male his medical problems include: HIV, hypertension, coronary artery disease (hx MI, status post DES to OM1 in 2017), chronic low back pain, SCC of skin, OSA, polysubstance abuse, GERD, asthma, anxiety, arthritis, more recently he was diagnosed with Adenocarcinoma of right lung, stage 4.   Oncologic history per notes:   DIAGNOSIS:  stage IV (T3, N3, M1 C) non-small cell lung cancer, adenocarcinoma presented with large right upper lobe lung mass in addition to right hilar and mediastinal as well as right supraclavicular lymphadenopathy and metastatic bone lesions including the left proximal humerus, L2 as well as sacral area and left adrenal metastasis diagnosed in June 2025.   Molecular studies show PDL1 of 0%     PRIOR THERAPY: Palliative radiation to the painful bone lesion in the left humerus. Last day on 05/03/24.   CURRENT THERAPY: Systemic chemotherapy with Carboplatin  for an AUC of 5, Alimta  500 mg/m2, and Libatyo IV every 3 weeks. He completed palliative radiation on 05/03/2024. First dose chemotherapy given 05/02/2024.  Significant neutropenia and thrombocytopenia have developed following initial dose chemotherapy.  He is to receive 3 consecutive doses of growth factor support   Per Dr. Sherrod: Intent of therapy is palliative.  SOCIAL HISTORY:     reports that he has quit  smoking. His smoking use included cigarettes. He started smoking about 42 years ago. He has a 42.6 pack-year smoking history. He has never used smokeless tobacco. He reports that he does not currently use drugs after having used the following drugs: Marijuana. Frequency: 3.00 times per week. He reports that he does not drink alcohol. History of cocaine use.   ADVANCE DIRECTIVES:  Full code full scope.  Plans to meet with social work to establish advance directives.  Referral to social work for this placed at initial visit.  Pending completion of forms.  CODE STATUS: Full code  PAST MEDICAL HISTORY: Past Medical History:  Diagnosis Date   Achalasia and cardiospasm    Allergy    Anxiety    Arthritis    back ,shoulders    Asthma    Boils    under arms hx of   CAD (coronary artery disease)    a. 09/2016: cath showing 100% RCA occlusion with collaterals and 90% OM1 (treated with DES)   Candidiasis of mouth    Candidiasis of unspecified site    Cholelithiasis    Cocaine abuse (HCC)    Quit 09/2016   Condyloma acuminatum in male of scrotum & anal canal s/p laser ablation 07/03/2012   Coronary artery disease    Dysphagia    Fracture, humerus    Frozen shoulder    left   GERD (gastroesophageal reflux disease)    Heart attack (HCC)    Hemorrhoids    Hepatitis 1993   A   Herpes labialis    HIV (human immunodeficiency virus infection) (HCC) DX 1993   Hypertension Dx 2014   off all bp  meds for last 9 or 10 months   Pilonidal disease 01/10/2012   Polysubstance abuse (HCC)    Psoriasis    hx of   Sleep apnea    does  have CPAP   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  MEDICATIONS:  Current Outpatient Medications  Medication Sig Dispense Refill   acetaminophen  (TYLENOL ) 325 MG tablet Take 650 mg by mouth every 6 (six)  hours as needed for mild pain (pain score 1-3), moderate pain (pain score 4-6) or headache.     albuterol  (VENTOLIN  HFA) 108 (90 Base) MCG/ACT inhaler Inhale 2 puffs into the lungs every 6 (six) hours as needed for wheezing or shortness of breath. 1 each 2   amLODipine  (NORVASC ) 5 MG tablet TAKE 1 TABLET (5 MG TOTAL) BY MOUTH DAILY. 30 tablet 0   amoxicillin -clavulanate (AUGMENTIN ) 875-125 MG tablet Take 1 tablet by mouth 2 (two) times daily. 20 tablet 0   aspirin  81 MG chewable tablet Chew 1 tablet (81 mg total) by mouth daily. 90 tablet 3   Bictegravir-Emtricitab-Tenofov (BIKTARVY ) 30-120-15 MG TABS Take 1 tablet by mouth daily. 90 tablet 3   Blood Pressure KIT 1 kit by Does not apply route daily. 1 kit 0   diclofenac  Sodium (VOLTAREN  ARTHRITIS PAIN) 1 % GEL Apply 2 g topically 4 (four) times daily. 100 g 3   folic acid  (FOLVITE ) 1 MG tablet Take 1 tablet (1 mg total) by mouth daily. Start 7 days before pemetrexed  chemotherapy. Continue until 21 days after pemetrexed  completed. 100 tablet 3   HYDROcodone -acetaminophen  (NORCO/VICODIN) 5-325 MG tablet Take 1 tablet by mouth every 6 (six) hours as needed.     hydrOXYzine  (ATARAX ) 25 MG tablet Take 1 tablet (25 mg total) by mouth 3 (three) times daily as needed. 30 tablet 0   lidocaine  (XYLOCAINE ) 2 % solution SWISH AND SWALLOW 5 MLS 3 (THREE) TIMES DAILY AS NEEDED FOR MOUTH PAIN. 360 mL 1   lidocaine -prilocaine  (EMLA ) cream Apply to affected area once 30 g 3   LORazepam  (ATIVAN ) 0.5 MG tablet Take 1 tablet (0.5 mg total) by mouth as needed for anxiety. Take 30 min prior to scan and have a ride arranged from the scan. 1 tablet 0   magic mouthwash SOLN Take 5 mLs by mouth 3 (three) times daily as needed for mouth pain. Suspension contains equal amounts of Maalox Extra Strength, nystatin , and diphenhydramine . 240 mL 1   methocarbamol  (ROBAXIN ) 500 MG tablet Take 2 tablets (1,000 mg total) by mouth every 8 (eight) hours as needed. 90 tablet 1   metoprolol   succinate (TOPROL -XL) 25 MG 24 hr tablet TAKE 1/2 TABLET BY MOUTH DAILY 45 tablet 0   nitroGLYCERIN  (NITROSTAT ) 0.4 MG SL tablet Place 1 tablet (0.4 mg total) under the tongue every 5 (five) minutes as needed for chest pain (trouble swallowing). 25 tablet 1   ondansetron  (ZOFRAN ) 8 MG tablet Take 1 tablet (8 mg total) by mouth every 8 (eight) hours as needed for nausea or vomiting. Start on the third day after carboplatin . 30 tablet 1   oxyCODONE -acetaminophen  (PERCOCET/ROXICET) 5-325 MG tablet Take 1 tablet by mouth every 8 (eight) hours as needed for severe pain (pain score 7-10). 30 tablet 0   prochlorperazine  (COMPAZINE ) 10 MG tablet Take 1 tablet (10 mg total) by mouth every 6 (six) hours as  needed for nausea or vomiting. 30 tablet 1   No current facility-administered medications for this visit.   Facility-Administered Medications Ordered in Other Visits  Medication Dose Route Frequency Provider Last Rate Last Admin   0.9 %  sodium chloride  infusion   Intravenous Continuous Benjamin Herrera Sherrod, MD 10 mL/hr at 05/23/24 1131 New Bag at 05/23/24 1131   CARBOplatin  (PARAPLATIN ) 520 mg in sodium chloride  0.9 % 250 mL chemo infusion  520 mg Intravenous Once Benjamin Herrera Sherrod, MD       cemiplimab -rwlc (LIBTAYO ) 350 mg in sodium chloride  0.9 % 100 mL chemo infusion  350 mg Intravenous Once Benjamin Herrera Sherrod, MD 214 mL/hr at 05/23/24 1212 350 mg at 05/23/24 1212   heparin  lock flush 100 unit/mL  500 Units Intracatheter Once PRN Benjamin Herrera Sherrod, MD       PEMEtrexed  Disodium (ALIMTA ) 800 mg in sodium chloride  0.9 % 100 mL chemo infusion  400 mg/m2 (Treatment Plan Ideal) Intravenous Once Benjamin Herrera Sherrod, MD       sodium chloride  flush (NS) 0.9 % injection 10 mL  10 mL Intracatheter PRN Benjamin Herrera Sherrod, MD        VITAL SIGNS:     05/23/2024   10:58 AM 05/17/2024   11:05 AM 05/16/2024    2:08 PM  Vitals with BMI  Height 6' 1  6' 1  Weight 177 lbs 180 lbs 5 oz 179 lbs  BMI 23.36 23.79 23.62  Systolic  100 107 102  Diastolic 66 72 58  Pulse 102 103 108      PERTINENT LABS:  Drugs of Abuse     Component Value Date/Time   LABOPIA NONE DETECTED 05/23/2024 1015   COCAINSCRNUR NONE DETECTED 05/23/2024 1015   COCAINSCRNUR POS (A) 08/22/2006 1054   LABBENZ NONE DETECTED 05/23/2024 1015   LABBENZ NEG 08/22/2006 1054   AMPHETMU NONE DETECTED 05/23/2024 1015   THCU NONE DETECTED 05/23/2024 1015   LABBARB NONE DETECTED 05/23/2024 1015      PERFORMANCE STATUS (ECOG) : 1 - Symptomatic but completely ambulatory   Physical Exam Constitutional:      General: He is not in acute distress.    Appearance: He is not ill-appearing or toxic-appearing.  Pulmonary:     Effort: Pulmonary effort is normal. No respiratory distress.  Neurological:     Mental Status: He is alert.  Psychiatric:        Mood and Affect: Mood normal.        Behavior: Behavior normal.    History of Present Illness       58 year old male being seen for follow-up/symptom management in the setting of stage IV metastatic lung cancer.  Patient has severe cancer related pain.  Pain is largely localized to his left shoulder at the site of a bony metastasis.  Pain has improved significantly after palliative radiation.  He rates the pain as a 3 out of 10 currently.  States he has little to no pain at rest.  He has decreased range of motion to bilateral shoulders and states he has an upcoming orthopedic appointment for consideration of surgery.  He uses oxycodone  as needed for pain which is helpful.  He does have a few tablets left and is aware that there is a refill waiting for him at the pharmacy.  He continues to experience constipation.  Tried MiraLAX which was ineffective.  Plans to try stimulant laxative.  Continues to have decreased appetite.  He states that his appetite just came back after his recent chemotherapy treatments.  He states that he developed some mouth pain after chemo for which he was prescribed Magic  mouthwash.  He is lost 2 additional pounds from prior weight. He did share he saw the RD today. Continues to consume nutritional meal supplements for weight support.   Continues to have some occasional insomnia. Some days are better than others. Sleeps with TV on. Naps sometimes during the day. Keeps house quiet and dark even during daytime hours. Sleeps with fans on.   Assessment and Plan     Cancer related pain - Chronic and improved post radiation, has Percocet 5-325 he can take every 8 hrs PRN which is effective. -Continue Percocet at current dose - Refill already at pharmacy awaiting pickup - UDS completed today and negative, will continue to monitor periodically - Encouraged him to call when he has only 2-3 days of medication left to request refil -Continue to monitor pain levels and adjust regimen as indicated  Decreased appetite and weight loss - Seen by RD today, continue to follow up with RD as recommended -Continue nutritional supplements  - Encouraged small, frequent snacks/meals with high protein/fat content  Mouth pain post chemo - Magic mouthwash ordered by oncology team  Difficulty sleeping -varies from night to night - Discussed sleep hygiene strategies.    Patient expressed understanding and was in agreement with this plan. He also understands that He can call the clinic at any time with any questions, concerns, or complaints.   Any controlled substances utilized were prescribed in the context of palliative care. PDMP has been reviewed.   Visit consisted of counseling and education dealing with the complex and emotionally intense issues of symptom management and palliative care in the setting of serious and potentially life-threatening illness.  Laymon Pinal, DNP, AGNP-c, [facility] Levon Borer, AGPCNP-BC  Palliative Medicine Team/[facility]

## 2024-05-23 NOTE — Patient Instructions (Signed)
 [facility] - A DEPT OF . [facility]  Discharge Instructions: Thank you for choosing [facility] to provide your oncology and hematology care.   If you have a lab appointment with the Cancer Center, please go directly to the Cancer Center and check in at the registration area.   Wear comfortable clothing and clothing appropriate for easy access to any Portacath or PICC line.   We strive to give you quality time with your provider. You may need to reschedule your appointment if you arrive late (15 or more minutes).  Arriving late affects you and other patients whose appointments are after yours.  Also, if you miss three or more appointments without notifying the office, you may be dismissed from the clinic at the provider's discretion.      For prescription refill requests, have your pharmacy contact our office and allow 72 hours for refills to be completed.    Today you received the following chemotherapy and/or immunotherapy agents: Libtayo , Alimta , Carboplatin .      To help prevent nausea and vomiting after your treatment, we encourage you to take your nausea medication as directed.  BELOW ARE SYMPTOMS THAT SHOULD BE REPORTED IMMEDIATELY: *FEVER GREATER THAN 100.4 F (38 C) OR HIGHER *CHILLS OR SWEATING *NAUSEA AND VOMITING THAT IS NOT CONTROLLED WITH YOUR NAUSEA MEDICATION *UNUSUAL SHORTNESS OF BREATH *UNUSUAL BRUISING OR BLEEDING *URINARY PROBLEMS (pain or burning when urinating, or frequent urination) *BOWEL PROBLEMS (unusual diarrhea, constipation, pain near the anus) TENDERNESS IN MOUTH AND THROAT WITH OR WITHOUT PRESENCE OF ULCERS (sore throat, sores in mouth, or a toothache) UNUSUAL RASH, SWELLING OR PAIN  UNUSUAL VAGINAL DISCHARGE OR ITCHING   Items with * indicate a potential emergency and should be followed up as soon as possible or go to the Emergency Department if any problems should occur.  Please show the CHEMOTHERAPY ALERT  CARD or IMMUNOTHERAPY ALERT CARD at check-in to the Emergency Department and triage nurse.  Should you have questions after your visit or need to cancel or reschedule your appointment, please contact [facility] - A DEPT OF [facility]  Dept: [phone]  and follow the prompts.  Office hours are 8:00 a.m. to 4:30 p.m. Monday - Friday. Please note that voicemails left after 4:00 p.m. may not be returned until the following business day.  We are closed weekends and major holidays. You have access to a nurse at all times for urgent questions. Please call the main number to the clinic Dept: [phone] and follow the prompts.   For any non-urgent questions, you may also contact your provider using MyChart. We now offer e-Visits for anyone 41 and older to request care online for non-urgent symptoms. For details visit [email or web address].   Also download the MyChart app! Go to the app store, search MyChart, open the app, select [facility], and log in with your MyChart username and password.  Cemiplimab  Injection What is this medication? CEMIPLIMAB  (se MIP li mab) treats skin cancer and lung cancer. It works by helping your immune system slow or stop the spread of cancer cells. It is a monoclonal antibody. This medicine may be used for other purposes; ask your health care provider or pharmacist if you have questions. COMMON BRAND NAME(S): LIBTAYO  What should I tell my care team before I take this medication? They need to know if you have any of these conditions: Allogeneic stem cell transplant (uses someone else's  stem cells) Autoimmune diseases, such as Crohn disease, ulcerative colitis, lupus History of chest radiation Nervous system problems, such as Guillain-Barre syndrome or myasthenia gravis Organ transplant An unusual or allergic reaction to cemiplimab , other medications, foods, dyes, or preservatives Pregnant or trying to get  pregnant Breastfeeding How should I use this medication? This medication is infused into a vein. It is given by your care team in a hospital or clinic setting. A special MedGuide will be given to you before each treatment. Be sure to read this information carefully each time. Talk to your care team about the use of this medication in children. Special care may be needed. Overdosage: If you think you have taken too much of this medicine contact a poison control center or emergency room at once. NOTE: This medicine is only for you. Do not share this medicine with others. What if I miss a dose? Keep appointments for follow-up doses. It is important not to miss your dose. Call your care team if you are unable to keep an appointment. What may interact with this medication? Interactions have not been studied. This list may not describe all possible interactions. Give your health care provider a list of all the medicines, herbs, non-prescription drugs, or dietary supplements you use. Also tell them if you smoke, drink alcohol, or use illegal drugs. Some items may interact with your medicine. What should I watch for while using this medication? Visit your care team for regular checks on your progress. It may be some time before you see the benefit from this medication. You may need blood work done while taking this medication. This medication may cause serious skin reactions. They can happen weeks to months after starting the medication. Contact your care team right away if you notice fevers or flu-like symptoms with a rash. The rash may be red or purple and then turn into blisters or peeling of the skin. You may also notice a red rash with swelling of the face, lips, or lymph nodes in your neck or under your arms. Tell your care team right away if you have any change in your eyesight. Talk to your care team if you may be pregnant. You will need a negative pregnancy test before starting this medication.  Contraception is recommended while taking this medication and for 4 months after the last dose. Your care team can help you find the option that works for you. Do not breastfeed while taking this medication and for at least 4 months after the last dose. What side effects may I notice from receiving this medication? Side effects that you should report to your care team as soon as possible: Allergic reactions--skin rash, itching, hives, swelling of the face, lips, tongue, or throat Dry cough, shortness of breath or trouble breathing Eye pain, redness, irritation, or discharge with blurry or decreased vision Heart muscle inflammation--unusual weakness or fatigue, shortness of breath, chest pain, fast or irregular heartbeat, dizziness, swelling of the ankles, feet, or hands Hormone gland problems--headache, sensitivity to light, unusual weakness or fatigue, dizziness, fast or irregular heartbeat, increased sensitivity to cold or heat, excessive sweating, constipation, hair loss, increased thirst or amount of urine, tremors or shaking, irritability Infusion reactions--chest pain, shortness of breath or trouble breathing, feeling faint or lightheaded Kidney injury (glomerulonephritis)--decrease in the amount of urine, red or dark brown urine, foamy or bubbly urine, swelling of the ankles, hands, or feet Liver injury--right upper belly pain, loss of appetite, nausea, light-colored stool, dark yellow or brown urine,  yellowing skin or eyes, unusual weakness or fatigue Pain, tingling, or numbness in the hands or feet, muscle weakness, change in vision, confusion or trouble speaking, loss of balance or coordination, trouble walking, seizures Rash, fever, and swollen lymph nodes Redness, blistering, peeling, or loosening of the skin, including inside the mouth Sudden or severe stomach pain, bloody diarrhea, fever, nausea, vomiting Side effects that usually do not require medical attention (report these to your  care team if they continue or are bothersome): Bone, joint, or muscle pain Diarrhea Fatigue Loss of appetite Nausea Skin rash This list may not describe all possible side effects. Call your doctor for medical advice about side effects. You may report side effects to FDA at 1-800-FDA-1088. Where should I keep my medication? This medication is given in a hospital or clinic and will not be stored at home. NOTE: This sheet is a summary. It may not cover all possible information. If you have questions about this medicine, talk to your doctor, pharmacist, or health care provider.  2024 Elsevier/Gold Standard (2022-11-29 00:00:00)

## 2024-05-23 NOTE — Progress Notes (Signed)
 58 year old male diagnosed with Lung cancer with bone metastasizes and followed by Dr. Sherrod.   PRIOR THERAPY:  Palliative radiation to the painful bone lesion in the left humerus. Last day on 05/03/24.    CURRENT THERAPY:  Systemic chemotherapy with Carboplatin  for an AUC of 5, Alimta  500 mg/m2, and Libatyo IV every 3 weeks. First dose on 05/02/24. Doses of Alimta  reduced to 400 mg/m2 starting from cycle #2 due to HIV and neutropenia  Past medical history includes Achalasia, CAD, Candidiasis, Cholelithiasis, Cocaine abuse, Dysphagia, GERD, MI, Hepatitis, HIV, Hypertension, Polysubstance abuse.  Medications include Folvite , Ativan , Magic Mouthwash, Zofran  and Compazine .  *Denies allergy to citrus or citric acid.  Labs include Sodium 132, Glucose 114, BUN 27, Creatinine 1.29,   Height: 6'1 Weight: 177 pounds July 30 195 pounds June 20 202 pounds May 28  UBW: 190 - 200 pounds per patient. BMI: 23.35  Estimated Nutrition Needs:  2700 - 2900 kcal, 120 - 140 grams protein, > 2.7 L fluid daily  Patient seen during infusion. He reports he has lost a lot of weight. His appetite is poor and he has difficulty swallowing. He is constipated and will pick up some Miralax/Senna today after treatment. He denies nausea and vomiting. He likes oral nutrition supplements and has been drinking one daily but cannot afford to purchase them.  Patient has lost 12% of his body weight in less than 3 months which is clinically significant.He has increased needs. He will benefit from ONS to help meet his increased needs.  Nutrition Diagnosis: Unintended weight loss related to poor appetite, inadequate oral intake and cancer as evidenced by 12% weight loss in 3 months.  Intervention: Educated to consume small amounts of soft, moist foods high in calories and high in protein. Drink 1 carton of Boost Plus or Ensure Plus 4 times daily to provide 1400 calories and ~60 grams protein. Educated to chop foods and add  gravies and sauces to ease swallowing. Provided nutrition education sheets and name and contact number. Provided samples of Ensure.  Monitoring, Evaluation, Goals: Patient will tolerate increased calories and protein to minimize weight loss and tolerate treatment.  Next Visit: Wednesday, August 20, during infusion with Elvie.

## 2024-05-23 NOTE — Progress Notes (Signed)
 Per Cassie PA, ok to treat with Pulse of 102

## 2024-05-24 ENCOUNTER — Other Ambulatory Visit: Payer: Self-pay

## 2024-05-24 DIAGNOSIS — C7951 Secondary malignant neoplasm of bone: Secondary | ICD-10-CM | POA: Diagnosis not present

## 2024-05-24 DIAGNOSIS — M898X2 Other specified disorders of bone, upper arm: Secondary | ICD-10-CM | POA: Diagnosis not present

## 2024-05-24 DIAGNOSIS — R6889 Other general symptoms and signs: Secondary | ICD-10-CM | POA: Diagnosis not present

## 2024-05-24 DIAGNOSIS — M899 Disorder of bone, unspecified: Secondary | ICD-10-CM | POA: Diagnosis not present

## 2024-05-24 NOTE — Progress Notes (Signed)
 Spoke with [facility] and confirmed once records are received, they will fax us  a prescription form for Ensure for the provider to sign.    Faxed records to [facility] with confirmation @ [phone].

## 2024-05-24 NOTE — Progress Notes (Signed)
 Subjective:  Patient ID: Benjamin Herrera is a 57 y.o. male   Chief Complaint:  58 yo male patient presents for follow up left shoulder pain. He is accompanied by his sister. He has completed radiotherapy with dramatic improvement in his pain and increased mobility of the arm, especially improved elbow motion.   INITIAL PRESENTATION:  He reports experiencing pain in her left shoulder, which she rates as a 4 or 5 on a scale of 10. The pain is present even when he is at rest and intensifies with movement. She has been under the care of radiation oncology for this issue and has undergone 5 treatments since last week, with 5 more planned. The onset of his shoulder pain was in January 2025, and it has progressively worsened since then.      REVIEW OF SYMPTOMS  Constitutional symptoms: extreme fatigue Eyes:  negative Ear, nose, throat:  negative Cardiovascular:  negative Respiratory:  negative Gastrointestinal:  negative Genitourinary:  negative Skin:  negative Neurological:  negative Musculoskeletal:  negative Psychiatric:  negative Endocrine:  negative Hematological:  negative Allergic:  negative    HISTORY    [Medical History]  [Medical History] Past Medical History     Diagnosis Date  . Arthritis    . Asthma (CMD)    . Dysphagia    . HIV (human immunodeficiency virus infection)    (CMD)    . Hyperlipemia    . MI (myocardial infarction)    (CMD)    . Sleep apnea      cpap machine    Past Surgical History      Procedure Laterality Date  . CARDIAC SURGERY        Procedure: CARDIAC SURGERY; stent placed  10/07/16  . FINGER SURGERY Left 11/28/2019    Procedure: FINGER SURGERY; left thumb a-1 pulley  . SHOULDER ARTHROSCOPY        Procedure: SHOULDER ARTHROSCOPY         Allergen Reactions  . Citrus And Derivatives Swelling      Mouth itching and swelling             Prior to Admission medications  Medication Sig Start Date End Date Taking? Authorizing Provider   acetaminophen  (TYLENOL ) 650 mg ER tablet Take  by mouth. 11/30/16   Yes HISTORICAL PROVIDER, CONVERSION  amLODIPine  (NORVASC ) 5 mg tablet Take  by mouth. 10/28/16   Yes HISTORICAL PROVIDER, CONVERSION  aspirin  81 mg chewable tablet CHEW 1 EVERY DAY 10/12/16   Yes HISTORICAL PROVIDER, CONVERSION  bictegrav-emtricit-tenofov ala (Biktarvy ) 30-120-15 mg tab Take 1 tablet by mouth Once Daily. 02/05/21 04/26/24 Yes HISTORICAL PROVIDER, CONVERSION  clopidogreL  (PLAVIX ) 75 mg tablet Take  by mouth. 10/21/16   Yes HISTORICAL PROVIDER, CONVERSION  cyclobenzaprine  (FLEXERIL ) 5 mg tablet TAKE 1 TABLET (5 MG TOTAL) BY MOUTH 3 (THREE) TIMES DAILY AS NEEDED FOR MUSCLE SPASMS. 08/03/16   Yes HISTORICAL PROVIDER, CONVERSION  darunavir  (Prezista ) 800 mg tablet Take  by mouth. 11/09/16   Yes HISTORICAL PROVIDER, CONVERSION  darunavir  (Prezista ) 800 mg tablet TAKE 1 TABLET BY MOUTH DAILY WITH BREAKFAST. CALL FOR JANUARY APPT. 08/06/19   Yes HISTORICAL PROVIDER, CONVERSION  ibuprofen  (MOTRIN ) 800 mg tablet TAKE 1 TAB BY MOUTH EVERY 8 HOURS AS NEEDED FOR MODERATE PAIN (OF SHOULDER. TAKE WITH FOOD). 10/31/19   Yes HISTORICAL PROVIDER, CONVERSION  metoprolol  succinate (TOPROL  XL) 25 mg 24 hr tablet TAKE 1/2 TABLET BY MOUTH EVERY DAY 04/26/19   Yes HISTORICAL PROVIDER, CONVERSION  nitroglycerin  (NITROSTAT ) 0.4  mg SL tablet Place 0.4 mg under the tongue. 11/15/19   Yes HISTORICAL PROVIDER, CONVERSION  rosuvastatin  (CRESTOR ) 40 mg tablet TAKE 1 TABLET BY MOUTH EVERY DAY *PT NEEDS APPT 02/14/19   Yes HISTORICAL PROVIDER, CONVERSION  Voltaren  1 % gel APPLY 2 G TOPICALLY 2 (TWO) TIMES DAILY. 07/02/16   Yes HISTORICAL PROVIDER, CONVERSION  dolutegravir  (Tivicay ) 50 mg tab tablet Take 50 mg by mouth Once Daily. Patient not taking: Reported on 04/26/2024 04/24/13     HISTORICAL PROVIDER, CONVERSION  elvitegravir-cobicistat-emtricitabine -tenofovir  ala (Genvoya ) 150-150-200-10 mg tab per tablet TAKE 1 TABLET BY MOUTH DAILY WITH BREAKFAST. Patient not  taking: Reported on 04/26/2024 11/09/16     HISTORICAL PROVIDER, CONVERSION  ezetimibe  (ZETIA ) 10 mg tablet Take 10 mg by mouth Once Daily. Patient not taking: Reported on 04/26/2024 03/02/21     HISTORICAL PROVIDER, CONVERSION  HYDROcodone -acetaminophen  (NORCO) 5-325 mg per tablet Take 1 tablet by mouth every 6 (six) hours as needed for moderate pain (4-6). 04/26/24 05/01/24   Jameson Sherre Pollen, NP  mirtazapine  (REMERON ) 7.5 mg tablet Take  by mouth. Patient not taking: Reported on 04/26/2024 11/30/16     HISTORICAL PROVIDER, CONVERSION  omeprazole  (PriLOSEC) 40 mg DR capsule TAKE 1 CAPSULE BY MOUTH DAILY BEFORE BREAKFAST. Patient not taking: Reported on 04/26/2024 05/25/16     HISTORICAL PROVIDER, CONVERSION      [Social History]  [Social History]      Socioeconomic History  . Marital status: Single  Tobacco Use  . Smoking status: Every Day      Current packs/day: 0.50      Types: Cigarettes  . Smokeless tobacco: Never  . Tobacco comments:      half pack a days  Substance and Sexual Activity  . Alcohol use: Yes  . Drug use: Yes      Types: Marijuana    Social Drivers of Health        Food Insecurity: No Food Insecurity (04/16/2024)    Received from [facility]    Food vital sign    . Within the past 12 months, you worried that your food would run out before you got money to buy more: Never true    . Within the past 12 months, the food you bought just didn't last and you didn't have money to get more: Never true  Transportation Needs: No Transportation Needs (04/16/2024)    Received from [facility] - Transportation    . In the past 12 months, has lack of transportation kept you from medical appointments or from getting medications?: No    . In the past 12 months, has lack of transportation kept you from meetings, work, or from getting things needed for daily living?: No  Safety: Not At Risk (04/16/2024)    Received from [facility]    Safety    . Within the last year, have you been  afraid of your partner or ex-partner?: No    . Within the last year, have you been humiliated or emotionally abused in other ways by your partner or ex-partner?: No    . Within the last year, have you been kicked, hit, slapped, or otherwise physically hurt by your partner or ex-partner?: No    . Within the last year, have you been raped or forced to have any kind of sexual activity by your partner or ex-partner?: No  Living Situation: Unknown (04/16/2024)    Received from [facility] Situation    .  In the last 12 months, was there a time when you were not able to pay the mortgage or rent on time?: No    . At any time in the past 12 months, were you homeless or living in a shelter (including now)?: No       [Family History]       Problem Relation Name Age of Onset  . Arthritis Mother      . Heart disease Mother         PHYSICAL EXAMINATION      BP (!) 95/59 (BP Location: Right arm, Patient Position: Sitting)   Pulse 89   Wt 81.2 kg (179 lb)   SpO2 99%   BMI 23.62 kg/m   Physical Exam Musculoskeletal: Left shoulder: Resolution of ST mass and tenderness of the proximal humerus Patient able to do pendulum swing at the shoulder without discomfort and 0-130 ROM at the elbow,. No distal swelling Skin changes over the posterior shoulder c/w resolving radiation burn. Mild discoloration and skin peeling persists.      Independent review of the following imaging and reports was completed by Dr Dorothe Jenkins [place] and correlated with clinical/pathology results. XRAY MRI CT  PET/CT   Radiology Results (last 30 days)       Procedure Component Value Units Date/Time    PET CT Skull Base To Mid Thigh [[id number]] Resulted: 04/09/24 1412    Order Status: Completed Updated: 04/26/24 1317    Narrative:      This result has an attachment that is not available. CLINICAL DATA:  Initial treatment strategy for lung cancer.   EXAM: NUCLEAR MEDICINE PET SKULL BASE TO THIGH    TECHNIQUE: 9.5 mCi F-18 FDG was injected intravenously. Full-ring PET imaging was performed from the skull base to thigh after the radiotracer. CT data was obtained and used for attenuation correction and anatomic localization.   Fasting blood glucose: 90 mg/dl   COMPARISON:  93/97/7974   FINDINGS: Mediastinal blood pool activity: SUV max 2.1   Liver activity: SUV max NA   NECK: Hypermetabolic right level IV lymph node adjacent to hypermetabolic supraclavicular nodes. The right level IV node measures 0.6 cm in short axis on image 45 series 4 with maximum SUV 8.6.   Anterior right parotid soft tissue density nodule measuring about 7 mm in diameter has maximum SUV of 4.9.   Incidental CT findings: Unremarkable   CHEST: Hypermetabolic right supraclavicular, right paratracheal, right hilar, left paratracheal, paraesophageal, subcarinal, bilateral axillary, and left subpectoral pathologic adenopathy along with some small subcutaneous lymph nodes along the left lateral chest. Index right hilar node 2.3 cm in short axis with maximum SUV 23.0. Index subcarinal node 1.5 cm in short axis on image 77 series 4 with maximum SUV 20.7.   The medial right upper lobe mass extending along the mediastinal margin measures of bowel 3.8 by 2.7 cm with maximum SUV 19.7.   Mild nodularity believed to probably be in the right upper lobe along the margin of the major fissure measuring 0.9 by 0.5 cm on image 34 series 7 has maximum SUV of 2.1; exact relationship of this nodularity with the minor fissure is difficult to discern.   Incidental CT findings: Atheromatous vascular calcification of the aortic arch and coronary arteries. Emphysema. Dilated esophagus.   ABDOMEN/PELVIS: Faintly accentuated metabolic activity in the left adrenal gland with maximum SUV of 4.3 but no well-defined adrenal mass. Right adrenal gland maximum SUV 3.0.   Small bilateral inguinal lymph nodes have  low-grade  activity.   Incidental CT findings: Atherosclerosis is present, including aortoiliac atherosclerotic disease. Cholelithiasis.   SKELETON: Scattered lytic osseous metastatic disease including the posterior right clavicle, the proximal left humerus (with pathologic fracture) the left sacrum, and the left L2 vertebral body. Index proximal left humeral lesion with maximum SUV 20.6. Index lesion in the left L2 vertebral body measures 2.6 cm in long axis with maximum SUV 20.2.   Incidental CT findings: None.   IMPRESSION: 1. Hypermetabolic medial right upper lobe mass extending along the mediastinal margin, compatible with primary bronchogenic carcinoma. 2. Extensive hypermetabolic pathologic adenopathy in the chest and right lower neck, compatible with metastatic adenopathy. 3. Scattered lytic osseous metastatic disease including the posterior right clavicle, the proximal left humerus (with pathologic fracture), the left sacrum, and the left L2 vertebral body. 4. Faintly accentuated metabolic activity in the left adrenal gland with maximum SUV of 4.3 but no well-defined adrenal mass. Right adrenal gland maximum SUV 3.0. Surveillance suggested. 5. Anterior right parotid soft tissue density nodule measuring about 7 mm in diameter has maximum SUV of 4.9. This is nonspecific and could be a primary parotid neoplasm or a metastatic lymph node. If the patient is an active smoker, Warthin's tumor is likely; otherwise, otolaryngology referral for potential biopsy may be indicated. 6. Cholelithiasis. 7. Aortic Atherosclerosis (ICD10-I70.0) and Emphysema (ICD10-J43.9).     Electronically Signed   By: Ryan Salvage M.D.   On: 04/09/2024 14:12    MRI Brain W And WO Contrast [[id number]] Resulted: 04/06/24 1547    Order Status: Completed Updated: 04/26/24 1317    Narrative:      This result has an attachment that is not available.CLINICAL DATA:  New lung cancer diagnosis, staging  workup.EXAM:MRI HEAD WITHOUT AND WITH CONTRASTTECHNIQUE:Multiplanar, multiecho pulse sequences of the brain and surroundingstructures were obtained without and with intravenous contrast. CONTRAST:  9mL GADAVIST  GADOBUTROL  1 MMOL/ML IV SOLNCOMPARISON:  None Available.FINDINGS:Brain: No acute infarct. No evidence of intracranial hemorrhage.Nonspecific scattered punctate foci of T2/FLAIR hyperintensity inthe periventricular and subcortical white matter. There is a 1.2 x1.0 x 0.9 cm enhancing mass in the region of the pineal glandabutting the tectum of the midbrain. The superior and slightlyposterior aspect of the mass abuts the internal cerebral veins.There are areas of susceptibility within the mass suggestive ofcalcification which appear to be primarily within the peripheralaspect of the lesion. No edema, mass effect, or midline shift. Thebasilar cisterns are patent. No extra-axial fluid collections.Ventricles: Normal size and configuration of the ventricles.Vascular: Skull base flow voids are visualized.Skull and upper cervical spine: Degenerative changes in thevisualized upper cervical spine. Visualized calvarium isunremarkable.Sinuses/Orbits: Orbits are symmetric. Mild mucosal thickening in theright maxillary sinus.Other: Mastoid air cells are clear. There is a 0 0.7 x 0.7 x 1.1 cmT2 hyperintense lesion within the superficial lobe of the leftparotid gland. 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 Differential considerations include metastatic disease,pineocytoma, or other pineal parenchymal tumor.No abnormal enhancement within the brain parenchyma.1.1 cm nonenhancing lesion within the superficial lobe of the leftparotid gland. Additional 1.8 cm nonenhancing lesion in the rightparotid tail  which is partially visualized. Findings could reflectparotid neoplasms. Recommend correlation with tissue sampling.Mild chronic microvascular ischemic changes.Electronically Signed  By: Donnice Mania M.D.  On: 04/06/2024 15:47    XR Outside Images Non Result [[id number]] Resulted: 04/03/24 2047    Order Status:  Completed Updated: 04/03/24 2047    Narrative:      These images were imported for review and or comparison.    XR Chest 1 View [[id number]] Resulted: 04/03/24 1522    Order Status: Completed Updated: 04/03/24 2047    Narrative:      CLINICAL DATA:  Status post bronchoscopy   EXAM: PORTABLE CHEST 1 VIEW   COMPARISON:  October 29, 2021   FINDINGS: Mild bilateral interstitial prominence of the bases of the lungs similar to prior examination without other infiltrates consolidations or pulmonary edema. No pneumothorax   IMPRESSION: Mild bilateral interstitial prominence of the bases of the lungs similar to prior examination without other infiltrates consolidations or pulmonary edema. Post bronchoscopy without evidence of pneumothorax     Electronically Signed   By: Franky Chard M.D.   On: 04/03/2024 15:22    MRI Shoulder Left W And WO Contrast [[id number]] Resulted: 03/30/24 9050    Order Status: Completed Updated: 04/03/24 2047    Narrative:      MR SHOULDER WITHOUT THEN WITH IV CONTRAST LEFT   COMPARISON: X-ray 03/19/2024   CLINICAL HISTORY: History of cancer with left shoulder pain.   PULSE SEQUENCES: Ax PD FS, Sag T2 FS, Cor T1 & COR T2 FS with and without 9 mL of IV contrast.   FINDINGS:   There is a large destructive mass replacing the proximal humeral metaphysis and diaphysis measuring approximately 4.4 x 4.9 x 6.6 cm in size. There is significant edema and destruction of the cortex and medullary bone. Findings are consistent with a large metastatic lesion in patient with history of lung mass. There is likely a pathologic fracture due to the degree of  destruction of bone. Otherwise, limited evaluation of the bony structures are unremarkable. Rotator cuff, biceps tendon and labrum are grossly intact.   IMPRESSION: Large mass replacing the proximal humeral metaphysis diaphysis with destruction of the cortex. There is a soft tissue component surrounding the proximal humerus. Finding is consistent with metastatic disease in patient with history of lung mass. There is either a pathologic fracture or pending pathologic fracture present.   Electronically signed by: Norleen Satchel MD 03/30/2024 09:49 AM EDT RP Workstation: [id number]           ASSESSMENT / PLAN    Assessment & Plan 1. 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

## 2024-05-25 DIAGNOSIS — C3491 Malignant neoplasm of unspecified part of right bronchus or lung: Secondary | ICD-10-CM | POA: Diagnosis not present

## 2024-05-25 DIAGNOSIS — R1319 Other dysphagia: Secondary | ICD-10-CM | POA: Diagnosis not present

## 2024-05-25 DIAGNOSIS — R6889 Other general symptoms and signs: Secondary | ICD-10-CM | POA: Diagnosis not present

## 2024-05-28 ENCOUNTER — Other Ambulatory Visit (HOSPITAL_COMMUNITY): Payer: Self-pay

## 2024-05-28 DIAGNOSIS — R6889 Other general symptoms and signs: Secondary | ICD-10-CM | POA: Diagnosis not present

## 2024-05-28 DIAGNOSIS — M19012 Primary osteoarthritis, left shoulder: Secondary | ICD-10-CM | POA: Diagnosis not present

## 2024-05-29 ENCOUNTER — Ambulatory Visit (HOSPITAL_COMMUNITY)
Admission: RE | Admit: 2024-05-29 | Discharge: 2024-05-29 | Disposition: A | Discharge status: Home / Self Care | Source: Ambulatory Visit | Attending: Radiology | Admitting: Radiology

## 2024-05-29 ENCOUNTER — Other Ambulatory Visit (HOSPITAL_COMMUNITY): Payer: Self-pay

## 2024-05-29 DIAGNOSIS — C3411 Malignant neoplasm of upper lobe, right bronchus or lung: Secondary | ICD-10-CM | POA: Diagnosis present

## 2024-05-29 DIAGNOSIS — C7951 Secondary malignant neoplasm of bone: Secondary | ICD-10-CM | POA: Diagnosis present

## 2024-05-29 MED ORDER — GADOBUTROL 1 MMOL/ML IV SOLN
8.0000 mL | Freq: Once | INTRAVENOUS | Status: AC | PRN
  Administered 2024-05-29: 8 mL via INTRAVENOUS

## 2024-05-30 ENCOUNTER — Inpatient Hospital Stay: Attending: Physician Assistant

## 2024-05-30 ENCOUNTER — Telehealth: Payer: Self-pay

## 2024-05-30 ENCOUNTER — Inpatient Hospital Stay

## 2024-05-30 ENCOUNTER — Telehealth: Payer: Self-pay | Admitting: Medical Oncology

## 2024-05-30 ENCOUNTER — Other Ambulatory Visit: Payer: Self-pay | Admitting: Physician Assistant

## 2024-05-30 VITALS — BP 118/69 | HR 99 | Temp 98.2°F | Resp 18

## 2024-05-30 DIAGNOSIS — C7931 Secondary malignant neoplasm of brain: Secondary | ICD-10-CM | POA: Diagnosis not present

## 2024-05-30 DIAGNOSIS — Z5189 Encounter for other specified aftercare: Secondary | ICD-10-CM | POA: Insufficient documentation

## 2024-05-30 DIAGNOSIS — C7972 Secondary malignant neoplasm of left adrenal gland: Secondary | ICD-10-CM | POA: Insufficient documentation

## 2024-05-30 DIAGNOSIS — C7951 Secondary malignant neoplasm of bone: Secondary | ICD-10-CM | POA: Diagnosis not present

## 2024-05-30 DIAGNOSIS — Z79630 Long term (current) use of alkylating agent: Secondary | ICD-10-CM | POA: Insufficient documentation

## 2024-05-30 DIAGNOSIS — D709 Neutropenia, unspecified: Secondary | ICD-10-CM | POA: Insufficient documentation

## 2024-05-30 DIAGNOSIS — Z87891 Personal history of nicotine dependence: Secondary | ICD-10-CM | POA: Insufficient documentation

## 2024-05-30 DIAGNOSIS — R6889 Other general symptoms and signs: Secondary | ICD-10-CM | POA: Diagnosis not present

## 2024-05-30 DIAGNOSIS — C3411 Malignant neoplasm of upper lobe, right bronchus or lung: Secondary | ICD-10-CM | POA: Insufficient documentation

## 2024-05-30 DIAGNOSIS — C3491 Malignant neoplasm of unspecified part of right bronchus or lung: Secondary | ICD-10-CM

## 2024-05-30 DIAGNOSIS — Z79620 Long term (current) use of immunosuppressive biologic: Secondary | ICD-10-CM | POA: Diagnosis not present

## 2024-05-30 DIAGNOSIS — Z7689 Persons encountering health services in other specified circumstances: Secondary | ICD-10-CM | POA: Insufficient documentation

## 2024-05-30 DIAGNOSIS — Z5111 Encounter for antineoplastic chemotherapy: Secondary | ICD-10-CM | POA: Diagnosis not present

## 2024-05-30 DIAGNOSIS — Z79631 Long term (current) use of antimetabolite agent: Secondary | ICD-10-CM | POA: Insufficient documentation

## 2024-05-30 DIAGNOSIS — Z510 Encounter for antineoplastic radiation therapy: Secondary | ICD-10-CM | POA: Insufficient documentation

## 2024-05-30 LAB — CBC WITH DIFFERENTIAL (CANCER CENTER ONLY)
Abs Immature Granulocytes: 0.01 K/uL (ref 0.00–0.07)
Basophils Absolute: 0 K/uL (ref 0.0–0.1)
Basophils Relative: 4 %
Eosinophils Absolute: 0 K/uL (ref 0.0–0.5)
Eosinophils Relative: 1 %
HCT: 38.2 % — ABNORMAL LOW (ref 39.0–52.0)
Hemoglobin: 12.8 g/dL — ABNORMAL LOW (ref 13.0–17.0)
Immature Granulocytes: 1 %
Lymphocytes Relative: 25 %
Lymphs Abs: 0.2 K/uL — ABNORMAL LOW (ref 0.7–4.0)
MCH: 25 pg — ABNORMAL LOW (ref 26.0–34.0)
MCHC: 33.5 g/dL (ref 30.0–36.0)
MCV: 74.5 fL — ABNORMAL LOW (ref 80.0–100.0)
Monocytes Absolute: 0 K/uL — ABNORMAL LOW (ref 0.1–1.0)
Monocytes Relative: 5 %
Neutro Abs: 0.5 K/uL — ABNORMAL LOW (ref 1.7–7.7)
Neutrophils Relative %: 64 %
Platelet Count: 181 K/uL (ref 150–400)
RBC: 5.13 MIL/uL (ref 4.22–5.81)
RDW: 14.7 % (ref 11.5–15.5)
Smear Review: NORMAL
WBC Count: 0.8 K/uL — CL (ref 4.0–10.5)
nRBC: 0 % (ref 0.0–0.2)

## 2024-05-30 LAB — CMP (CANCER CENTER ONLY)
ALT: 55 U/L — ABNORMAL HIGH (ref 0–44)
AST: 53 U/L — ABNORMAL HIGH (ref 15–41)
Albumin: 3.8 g/dL (ref 3.5–5.0)
Alkaline Phosphatase: 89 U/L (ref 38–126)
Anion gap: 6 (ref 5–15)
BUN: 24 mg/dL — ABNORMAL HIGH (ref 6–20)
CO2: 27 mmol/L (ref 22–32)
Calcium: 6.4 mg/dL — CL (ref 8.9–10.3)
Chloride: 101 mmol/L (ref 98–111)
Creatinine: 1.04 mg/dL (ref 0.61–1.24)
GFR, Estimated: >60 mL/min (ref >60)
Glucose, Bld: 105 mg/dL — ABNORMAL HIGH (ref 70–99)
Potassium: 3.6 mmol/L (ref 3.5–5.1)
Sodium: 134 mmol/L — ABNORMAL LOW (ref 135–145)
Total Bilirubin: 0.5 mg/dL (ref 0.0–1.2)
Total Protein: 7.7 g/dL (ref 6.5–8.1)

## 2024-05-30 MED ORDER — FILGRASTIM-SNDZ 480 MCG/0.8ML IJ SOSY
480.0000 ug | PREFILLED_SYRINGE | Freq: Once | INTRAMUSCULAR | Status: AC
  Administered 2024-05-30: 480 ug via SUBCUTANEOUS
  Filled 2024-05-30: qty 0.8

## 2024-05-30 NOTE — Telephone Encounter (Signed)
 CRITICAL VALUE STICKER  CRITICAL VALUE:WBC=0.8  RECEIVER (on-site recipient of call):Grayson Pfefferle, RN  DATE & TIME NOTIFIED: 05/30/2024@ 1530  MESSENGER (representative from lab):Melissa  MD NOTIFIED: Heilingoetter,PA-C  TIME OF NOTIFICATION:1533  RESPONSE:  GCSF ordered x 3 days.

## 2024-05-30 NOTE — Telephone Encounter (Signed)
 Pt notified of neutropenia and to come for Zarixo today , Thursday and Friday .

## 2024-05-30 NOTE — Telephone Encounter (Signed)
 CRITICAL VALUE STICKER  CRITICAL VALUE: Ca++=6.4  RECEIVER (on-site recipient of call):Eveleigh Crumpler, RN  DATE & TIME NOTIFIED: 05/30/2022 @ 1615  MESSENGER (representative from lab): Melissa  MD NOTIFIED: Heilingoetter,PA-C  TIME OF NOTIFICATION:1618  RESPONSE:  Xgeva  injection cancelled for tomorrow. Recheck Ca++ next wed.

## 2024-05-30 NOTE — Telephone Encounter (Signed)
 Spoke with patient regarding lab results. Per providers, patient's calcium  level is low. Advised patient to begin taking an over-the-counter calcium  supplement daily.  Informed patient that he will not receive his scheduled Xgeva  injection tomorrow due to the low calcium  level. Labs will be rechecked on 06/06/24, and if calcium  is within normal limits, we will proceed with scheduling the Xgeva  injection.  Patient voiced understanding.

## 2024-05-31 ENCOUNTER — Encounter: Payer: Self-pay | Admitting: Physician Assistant

## 2024-05-31 ENCOUNTER — Inpatient Hospital Stay

## 2024-05-31 ENCOUNTER — Inpatient Hospital Stay: Admitting: Licensed Clinical Social Worker

## 2024-05-31 ENCOUNTER — Other Ambulatory Visit (HOSPITAL_BASED_OUTPATIENT_CLINIC_OR_DEPARTMENT_OTHER): Payer: Self-pay | Admitting: Internal Medicine

## 2024-05-31 ENCOUNTER — Other Ambulatory Visit (HOSPITAL_COMMUNITY): Payer: Self-pay

## 2024-05-31 ENCOUNTER — Encounter: Payer: Self-pay | Admitting: Internal Medicine

## 2024-05-31 VITALS — BP 96/62 | HR 97 | Resp 17

## 2024-05-31 DIAGNOSIS — Z79620 Long term (current) use of immunosuppressive biologic: Secondary | ICD-10-CM | POA: Diagnosis not present

## 2024-05-31 DIAGNOSIS — C7931 Secondary malignant neoplasm of brain: Secondary | ICD-10-CM | POA: Diagnosis not present

## 2024-05-31 DIAGNOSIS — C7951 Secondary malignant neoplasm of bone: Secondary | ICD-10-CM | POA: Diagnosis not present

## 2024-05-31 DIAGNOSIS — R6889 Other general symptoms and signs: Secondary | ICD-10-CM | POA: Diagnosis not present

## 2024-05-31 DIAGNOSIS — C3411 Malignant neoplasm of upper lobe, right bronchus or lung: Secondary | ICD-10-CM | POA: Diagnosis not present

## 2024-05-31 DIAGNOSIS — Z510 Encounter for antineoplastic radiation therapy: Secondary | ICD-10-CM | POA: Diagnosis not present

## 2024-05-31 DIAGNOSIS — D709 Neutropenia, unspecified: Secondary | ICD-10-CM

## 2024-05-31 DIAGNOSIS — C3491 Malignant neoplasm of unspecified part of right bronchus or lung: Secondary | ICD-10-CM

## 2024-05-31 DIAGNOSIS — Z7689 Persons encountering health services in other specified circumstances: Secondary | ICD-10-CM | POA: Diagnosis not present

## 2024-05-31 DIAGNOSIS — Z5111 Encounter for antineoplastic chemotherapy: Secondary | ICD-10-CM | POA: Diagnosis not present

## 2024-05-31 DIAGNOSIS — Z79630 Long term (current) use of alkylating agent: Secondary | ICD-10-CM | POA: Diagnosis not present

## 2024-05-31 DIAGNOSIS — C7972 Secondary malignant neoplasm of left adrenal gland: Secondary | ICD-10-CM | POA: Diagnosis not present

## 2024-05-31 DIAGNOSIS — Z87891 Personal history of nicotine dependence: Secondary | ICD-10-CM | POA: Diagnosis not present

## 2024-05-31 DIAGNOSIS — Z79631 Long term (current) use of antimetabolite agent: Secondary | ICD-10-CM | POA: Diagnosis not present

## 2024-05-31 MED ORDER — FILGRASTIM-SNDZ 480 MCG/0.8ML IJ SOSY
480.0000 ug | PREFILLED_SYRINGE | Freq: Once | INTRAMUSCULAR | Status: AC
  Administered 2024-05-31: 480 ug via SUBCUTANEOUS

## 2024-05-31 NOTE — Progress Notes (Signed)
 [facility] CSW Progress Note  Clinical Child psychotherapist contacted patient by phone to follow-up on medication concerns.    Interventions: Pt reports he needed to pay $75 co-pay to pick up his magic mouthwash the last time it was prescribed.  CSW confirmed pt was approved for the [employer].  CSW informed pt if the prescription is sent to the [facility] pharmacy the [employer] can be used for co-pays.  Script sent to [facility], pt verbalized understanding and will pick up at [facility].      Follow Up Plan:  Patient will contact CSW with any support or resource needs    Benjamin Herrera JONELLE Manna, LCSW Clinical Social Worker [facility]    Patient is participating in a Managed Medicaid Plan:  Yes

## 2024-06-01 ENCOUNTER — Inpatient Hospital Stay

## 2024-06-01 ENCOUNTER — Other Ambulatory Visit (HOSPITAL_COMMUNITY): Payer: Self-pay

## 2024-06-01 VITALS — BP 93/64 | HR 101 | Temp 98.2°F | Resp 18

## 2024-06-01 DIAGNOSIS — C7972 Secondary malignant neoplasm of left adrenal gland: Secondary | ICD-10-CM | POA: Diagnosis not present

## 2024-06-01 DIAGNOSIS — Z5111 Encounter for antineoplastic chemotherapy: Secondary | ICD-10-CM | POA: Diagnosis not present

## 2024-06-01 DIAGNOSIS — D709 Neutropenia, unspecified: Secondary | ICD-10-CM

## 2024-06-01 DIAGNOSIS — C3411 Malignant neoplasm of upper lobe, right bronchus or lung: Secondary | ICD-10-CM | POA: Diagnosis not present

## 2024-06-01 DIAGNOSIS — Z7689 Persons encountering health services in other specified circumstances: Secondary | ICD-10-CM | POA: Diagnosis not present

## 2024-06-01 DIAGNOSIS — Z87891 Personal history of nicotine dependence: Secondary | ICD-10-CM | POA: Diagnosis not present

## 2024-06-01 DIAGNOSIS — Z79631 Long term (current) use of antimetabolite agent: Secondary | ICD-10-CM | POA: Diagnosis not present

## 2024-06-01 DIAGNOSIS — Z79630 Long term (current) use of alkylating agent: Secondary | ICD-10-CM | POA: Diagnosis not present

## 2024-06-01 DIAGNOSIS — C3491 Malignant neoplasm of unspecified part of right bronchus or lung: Secondary | ICD-10-CM

## 2024-06-01 DIAGNOSIS — R6889 Other general symptoms and signs: Secondary | ICD-10-CM | POA: Diagnosis not present

## 2024-06-01 DIAGNOSIS — Z79620 Long term (current) use of immunosuppressive biologic: Secondary | ICD-10-CM | POA: Diagnosis not present

## 2024-06-01 DIAGNOSIS — Z510 Encounter for antineoplastic radiation therapy: Secondary | ICD-10-CM | POA: Diagnosis not present

## 2024-06-01 DIAGNOSIS — C7951 Secondary malignant neoplasm of bone: Secondary | ICD-10-CM | POA: Diagnosis not present

## 2024-06-01 DIAGNOSIS — C7931 Secondary malignant neoplasm of brain: Secondary | ICD-10-CM | POA: Diagnosis not present

## 2024-06-01 MED ORDER — FILGRASTIM-SNDZ 480 MCG/0.8ML IJ SOSY
480.0000 ug | PREFILLED_SYRINGE | Freq: Once | INTRAMUSCULAR | Status: AC
  Administered 2024-06-01: 480 ug via SUBCUTANEOUS
  Filled 2024-06-01: qty 0.8

## 2024-06-04 ENCOUNTER — Inpatient Hospital Stay

## 2024-06-04 ENCOUNTER — Ambulatory Visit
Admission: RE | Admit: 2024-06-04 | Discharge: 2024-06-04 | Disposition: A | Discharge status: Home / Self Care | Source: Ambulatory Visit | Attending: Internal Medicine | Admitting: Internal Medicine

## 2024-06-04 ENCOUNTER — Encounter

## 2024-06-04 DIAGNOSIS — C7951 Secondary malignant neoplasm of bone: Secondary | ICD-10-CM

## 2024-06-04 DIAGNOSIS — R6889 Other general symptoms and signs: Secondary | ICD-10-CM | POA: Diagnosis not present

## 2024-06-04 DIAGNOSIS — C3411 Malignant neoplasm of upper lobe, right bronchus or lung: Secondary | ICD-10-CM

## 2024-06-04 NOTE — Progress Notes (Signed)
 [facility] Healthcare Advance Directives Clinical Social Work  Patient presented to Advance Directives Clinic  to review and complete healthcare advance directives.  Clinical Social Worker met with patient and sister Apolinar Right.  The patient designated Apolinar Right  as their primary healthcare agent and Gailen Gull as their secondary agent.  Patient also completed healthcare living will.    Documents were notarized and copies made for patient/family. Clinical Social Worker will send documents to medical records to be scanned into patient's chart. Clinical Social Worker encouraged patient/family to contact with any additional questions or concerns.   Lizbeth Sprague, LCSW Clinical Social Worker [facility]

## 2024-06-04 NOTE — Progress Notes (Signed)
  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  The patient states he has done well since completion of his radiation treatments to his right lung and left humerus.  He reports his fatigue has improved.  He states his pain has improved since completion of treatment.  He continues to have some irritation in his mouth and throat and has been using magic mouthwash prescribed by his medical oncologist.  The patient is scheduled for ongoing care with Dr. Sherrod in medical oncology. The patient was encouraged to call if he  develops concerns or questions regarding radiation.

## 2024-06-05 DIAGNOSIS — R6889 Other general symptoms and signs: Secondary | ICD-10-CM | POA: Diagnosis not present

## 2024-06-06 ENCOUNTER — Telehealth: Payer: Self-pay | Admitting: Radiation Oncology

## 2024-06-06 ENCOUNTER — Encounter: Admitting: Infectious Diseases

## 2024-06-06 ENCOUNTER — Inpatient Hospital Stay

## 2024-06-06 ENCOUNTER — Other Ambulatory Visit: Payer: Self-pay | Admitting: Radiation Oncology

## 2024-06-06 DIAGNOSIS — R6889 Other general symptoms and signs: Secondary | ICD-10-CM | POA: Diagnosis not present

## 2024-06-06 MED ORDER — LORAZEPAM 0.5 MG PO TABS: ORAL_TABLET | ORAL | 0 refills | Status: DC

## 2024-06-06 NOTE — Telephone Encounter (Signed)
 LM for pt to call back to discuss MRI results and recommendations.

## 2024-06-06 NOTE — Progress Notes (Signed)
 Has armband been applied?  Yes.    Does patient have an allergy to IV contrast dye?: No.   Has patient ever received premedication for IV contrast dye?: No.   Does patient take metformin?: No.  If patient does take metformin when was the last dose:   Date of lab work: 05/30/2024 BUN: 24 CR: 1.04 eGfr: >60  IV site: antecubital right, condition patent and no redness  Has IV site been added to flowsheet?  Yes.    BP 133/83 (BP Location: Right Arm, Patient Position: Sitting)   Pulse (!) 126   Temp (!) 97.5 F (36.4 C) (Temporal)   Resp 18   Ht 6' 1 (1.854 m)   Wt 182 lb 4 oz (82.7 kg)   SpO2 100%   BMI 24.04 kg/m

## 2024-06-06 NOTE — Progress Notes (Signed)
 Pt called back to review MRI scan. We discussed the recommendation by Dr. Dewey  for [facility] treatment in 3 fractions. We discussed the risks, benefits, short, and long term effects of radiotherapy, as well as the curative intent, and the patient is interested in proceeding. We discussed the delivery and logistics of radiotherapy. He will be contacted by the special procedures navigator to coordinate with Dr. Lanis. He will come for simulation tomorrow and a prescription was sent to his pharmacy for Ativan  due to his claustrophobia. He will have a family member drive to his simulation and treatment so he can take Ativan  prior.

## 2024-06-07 ENCOUNTER — Ambulatory Visit
Admission: RE | Admit: 2024-06-07 | Discharge: 2024-06-07 | Disposition: A | Discharge status: Home / Self Care | Source: Ambulatory Visit | Attending: Radiation Oncology | Admitting: Radiation Oncology

## 2024-06-07 ENCOUNTER — Inpatient Hospital Stay

## 2024-06-07 DIAGNOSIS — C3411 Malignant neoplasm of upper lobe, right bronchus or lung: Secondary | ICD-10-CM | POA: Insufficient documentation

## 2024-06-07 DIAGNOSIS — Z5111 Encounter for antineoplastic chemotherapy: Secondary | ICD-10-CM | POA: Diagnosis not present

## 2024-06-07 DIAGNOSIS — C7951 Secondary malignant neoplasm of bone: Secondary | ICD-10-CM | POA: Diagnosis not present

## 2024-06-07 DIAGNOSIS — Z510 Encounter for antineoplastic radiation therapy: Secondary | ICD-10-CM | POA: Insufficient documentation

## 2024-06-07 DIAGNOSIS — Z79620 Long term (current) use of immunosuppressive biologic: Secondary | ICD-10-CM | POA: Diagnosis not present

## 2024-06-07 DIAGNOSIS — Z79630 Long term (current) use of alkylating agent: Secondary | ICD-10-CM | POA: Diagnosis not present

## 2024-06-07 DIAGNOSIS — Z7689 Persons encountering health services in other specified circumstances: Secondary | ICD-10-CM | POA: Diagnosis not present

## 2024-06-07 DIAGNOSIS — Z87891 Personal history of nicotine dependence: Secondary | ICD-10-CM | POA: Diagnosis not present

## 2024-06-07 DIAGNOSIS — R6889 Other general symptoms and signs: Secondary | ICD-10-CM | POA: Diagnosis not present

## 2024-06-07 DIAGNOSIS — C7972 Secondary malignant neoplasm of left adrenal gland: Secondary | ICD-10-CM | POA: Diagnosis not present

## 2024-06-07 DIAGNOSIS — D709 Neutropenia, unspecified: Secondary | ICD-10-CM | POA: Diagnosis not present

## 2024-06-07 DIAGNOSIS — C3491 Malignant neoplasm of unspecified part of right bronchus or lung: Secondary | ICD-10-CM

## 2024-06-07 DIAGNOSIS — C7931 Secondary malignant neoplasm of brain: Secondary | ICD-10-CM | POA: Diagnosis not present

## 2024-06-07 DIAGNOSIS — Z79631 Long term (current) use of antimetabolite agent: Secondary | ICD-10-CM | POA: Diagnosis not present

## 2024-06-07 LAB — CMP (CANCER CENTER ONLY)
ALT: 19 U/L (ref 0–44)
AST: 16 U/L (ref 15–41)
Albumin: 3.7 g/dL (ref 3.5–5.0)
Alkaline Phosphatase: 78 U/L (ref 38–126)
Anion gap: 7 (ref 5–15)
BUN: 19 mg/dL (ref 6–20)
CO2: 28 mmol/L (ref 22–32)
Calcium: 9.2 mg/dL (ref 8.9–10.3)
Chloride: 102 mmol/L (ref 98–111)
Creatinine: 0.89 mg/dL (ref 0.61–1.24)
GFR, Estimated: >60 mL/min (ref >60)
Glucose, Bld: 82 mg/dL (ref 70–99)
Potassium: 3.3 mmol/L — ABNORMAL LOW (ref 3.5–5.1)
Sodium: 137 mmol/L (ref 135–145)
Total Bilirubin: 0.3 mg/dL (ref 0.0–1.2)
Total Protein: 7.1 g/dL (ref 6.5–8.1)

## 2024-06-07 LAB — CBC WITH DIFFERENTIAL (CANCER CENTER ONLY)
Abs Immature Granulocytes: 0.3 K/uL — ABNORMAL HIGH (ref 0.00–0.07)
Basophils Absolute: 0 K/uL (ref 0.0–0.1)
Basophils Relative: 1 %
Eosinophils Absolute: 0.1 K/uL (ref 0.0–0.5)
Eosinophils Relative: 2 %
HCT: 32.2 % — ABNORMAL LOW (ref 39.0–52.0)
Hemoglobin: 10.6 g/dL — ABNORMAL LOW (ref 13.0–17.0)
Immature Granulocytes: 8 %
Lymphocytes Relative: 20 %
Lymphs Abs: 0.8 K/uL (ref 0.7–4.0)
MCH: 24.9 pg — ABNORMAL LOW (ref 26.0–34.0)
MCHC: 32.9 g/dL (ref 30.0–36.0)
MCV: 75.8 fL — ABNORMAL LOW (ref 80.0–100.0)
Monocytes Absolute: 0.7 K/uL (ref 0.1–1.0)
Monocytes Relative: 18 %
Neutro Abs: 1.9 K/uL (ref 1.7–7.7)
Neutrophils Relative %: 51 %
Platelet Count: 64 K/uL — ABNORMAL LOW (ref 150–400)
RBC: 4.25 MIL/uL (ref 4.22–5.81)
RDW: 14.9 % (ref 11.5–15.5)
WBC Count: 3.7 K/uL — ABNORMAL LOW (ref 4.0–10.5)
nRBC: 0 % (ref 0.0–0.2)

## 2024-06-08 ENCOUNTER — Ambulatory Visit

## 2024-06-08 ENCOUNTER — Ambulatory Visit: Admitting: Radiation Oncology

## 2024-06-08 DIAGNOSIS — C3490 Malignant neoplasm of unspecified part of unspecified bronchus or lung: Secondary | ICD-10-CM | POA: Diagnosis not present

## 2024-06-08 DIAGNOSIS — N3941 Urge incontinence: Secondary | ICD-10-CM | POA: Diagnosis not present

## 2024-06-11 ENCOUNTER — Ambulatory Visit: Payer: Self-pay

## 2024-06-11 ENCOUNTER — Telehealth: Payer: Self-pay

## 2024-06-11 ENCOUNTER — Encounter: Payer: Self-pay | Admitting: Internal Medicine

## 2024-06-11 DIAGNOSIS — R6889 Other general symptoms and signs: Secondary | ICD-10-CM | POA: Diagnosis not present

## 2024-06-11 DIAGNOSIS — C7931 Secondary malignant neoplasm of brain: Secondary | ICD-10-CM | POA: Diagnosis not present

## 2024-06-11 NOTE — Telephone Encounter (Signed)
 Copied from CRM #[id number]. Topic: Clinical - Order For Equipment >> Jun 11, 2024  3:48 PM Turkey B wrote: Reason for CRM: patient called about rolator he needs. Please cb

## 2024-06-11 NOTE — Telephone Encounter (Signed)
 Order has been started in parachute app.

## 2024-06-11 NOTE — Progress Notes (Signed)
 Returned call to patient from voicemail.  Answered questions in detail regarding expenses to be submitted from the [employer]. Emailed him as well.  He has my card for any additional financial questions or concerns.

## 2024-06-11 NOTE — Telephone Encounter (Signed)
 Copied from CRM #[id number]. Topic: Clinical - Red Word Triage >> Jun 11, 2024  3:49 PM Turkey B wrote: [facility] that prompted transfer to Nurse Triage: Patient had fall last week

## 2024-06-11 NOTE — Telephone Encounter (Signed)
 FYI Only or Action Required?: Action required by provider: clinical question for provider.  Patient was last seen in primary care on 05/14/2024 by Newlin, Enobong, MD.  Called Nurse Triage reporting Fall.  Symptoms began x week ago.  Interventions attempted: Nothing.  Symptoms are: gradually worsening.  Triage Disposition: See HCP Within 4 Hours (Or PCP Triage)  Patient/caregiver understands and will follow disposition?: Yes  Reason for Disposition  [1] MODERATE weakness (e.g., interferes with work, school, normal activities) AND [2] new-onset or getting worse  Answer Assessment - Initial Assessment Questions 1. MECHANISM: How did the fall happen?     Slipped and fall 2. DOMESTIC VIOLENCE AND ELDER ABUSE SCREENING: Did you fall because someone pushed you or tried to hurt you? If Yes, ask: Are you safe now?     no 3. ONSET: When did the fall happen? (e.g., minutes, hours, or days ago)     X week 4. LOCATION: What part of the body hit the ground? (e.g., back, buttocks, head, hips, knees, hands, head, stomach)     Left arm, stomach, back 5. INJURY: Did you hurt (injure) yourself when you fell? If Yes, ask: What did you injure? Tell me more about this? (e.g., body area; type of injury; pain severity)     Left and right arm 3/10 low back 3/10 6. PAIN: Is there any pain? If Yes, ask: How bad is the pain? (e.g., Scale 0-10; or none, mild,      3/10 7. SIZE: For cuts, bruises, or swelling, ask: How large is it? (e.g., inches or centimeters)      na 8. PREGNANCY: Is there any chance you are pregnant? When was your last menstrual period?     na 9. OTHER SYMPTOMS: Do you have any other symptoms? (e.g., dizziness, fever, weakness; new-onset or worsening).      no 10. CAUSE: What do you think caused the fall (or falling)? (e.g., dizzy spell, tripped)       Na Patient is in need of Diapers, bedpan, rollater .  Please call patient to provide status on these items  needed.  Pt stated he already filled out papers for the diapers and bedpan.  Offered patient an appt: pt stated only need update.  Protocols used: Falls and [facility]

## 2024-06-12 DIAGNOSIS — Z7689 Persons encountering health services in other specified circumstances: Secondary | ICD-10-CM | POA: Diagnosis not present

## 2024-06-12 DIAGNOSIS — Z87891 Personal history of nicotine dependence: Secondary | ICD-10-CM | POA: Diagnosis not present

## 2024-06-12 DIAGNOSIS — Z79620 Long term (current) use of immunosuppressive biologic: Secondary | ICD-10-CM | POA: Diagnosis not present

## 2024-06-12 DIAGNOSIS — C7931 Secondary malignant neoplasm of brain: Secondary | ICD-10-CM | POA: Diagnosis not present

## 2024-06-12 DIAGNOSIS — I10 Essential (primary) hypertension: Secondary | ICD-10-CM | POA: Diagnosis not present

## 2024-06-12 DIAGNOSIS — D709 Neutropenia, unspecified: Secondary | ICD-10-CM | POA: Diagnosis not present

## 2024-06-12 DIAGNOSIS — R6889 Other general symptoms and signs: Secondary | ICD-10-CM | POA: Diagnosis not present

## 2024-06-12 DIAGNOSIS — Z79630 Long term (current) use of alkylating agent: Secondary | ICD-10-CM | POA: Diagnosis not present

## 2024-06-12 DIAGNOSIS — C7972 Secondary malignant neoplasm of left adrenal gland: Secondary | ICD-10-CM | POA: Diagnosis not present

## 2024-06-12 DIAGNOSIS — C7951 Secondary malignant neoplasm of bone: Secondary | ICD-10-CM | POA: Diagnosis not present

## 2024-06-12 DIAGNOSIS — Z510 Encounter for antineoplastic radiation therapy: Secondary | ICD-10-CM | POA: Diagnosis not present

## 2024-06-12 DIAGNOSIS — Z79631 Long term (current) use of antimetabolite agent: Secondary | ICD-10-CM | POA: Diagnosis not present

## 2024-06-12 DIAGNOSIS — Z5111 Encounter for antineoplastic chemotherapy: Secondary | ICD-10-CM | POA: Diagnosis not present

## 2024-06-12 DIAGNOSIS — G4733 Obstructive sleep apnea (adult) (pediatric): Secondary | ICD-10-CM | POA: Diagnosis not present

## 2024-06-12 DIAGNOSIS — G4710 Hypersomnia, unspecified: Secondary | ICD-10-CM | POA: Diagnosis not present

## 2024-06-12 DIAGNOSIS — C3411 Malignant neoplasm of upper lobe, right bronchus or lung: Secondary | ICD-10-CM | POA: Diagnosis not present

## 2024-06-12 MED FILL — Fosaprepitant Dimeglumine For IV Infusion 150 MG (Base Eq): INTRAVENOUS | Qty: 5 | Status: AC

## 2024-06-13 ENCOUNTER — Other Ambulatory Visit

## 2024-06-13 ENCOUNTER — Inpatient Hospital Stay (HOSPITAL_BASED_OUTPATIENT_CLINIC_OR_DEPARTMENT_OTHER): Admitting: Nurse Practitioner

## 2024-06-13 ENCOUNTER — Encounter: Payer: Self-pay | Admitting: Internal Medicine

## 2024-06-13 ENCOUNTER — Inpatient Hospital Stay

## 2024-06-13 ENCOUNTER — Inpatient Hospital Stay: Admitting: Dietician

## 2024-06-13 ENCOUNTER — Inpatient Hospital Stay (HOSPITAL_BASED_OUTPATIENT_CLINIC_OR_DEPARTMENT_OTHER): Admitting: Internal Medicine

## 2024-06-13 VITALS — BP 129/68 | HR 83 | Temp 97.6°F | Resp 17 | Ht 73.0 in | Wt 191.0 lb

## 2024-06-13 DIAGNOSIS — K5903 Drug induced constipation: Secondary | ICD-10-CM

## 2024-06-13 DIAGNOSIS — G893 Neoplasm related pain (acute) (chronic): Secondary | ICD-10-CM | POA: Diagnosis not present

## 2024-06-13 DIAGNOSIS — C7951 Secondary malignant neoplasm of bone: Secondary | ICD-10-CM | POA: Diagnosis not present

## 2024-06-13 DIAGNOSIS — Z79630 Long term (current) use of alkylating agent: Secondary | ICD-10-CM | POA: Diagnosis not present

## 2024-06-13 DIAGNOSIS — Z515 Encounter for palliative care: Secondary | ICD-10-CM

## 2024-06-13 DIAGNOSIS — R530 Neoplastic (malignant) related fatigue: Secondary | ICD-10-CM | POA: Diagnosis not present

## 2024-06-13 DIAGNOSIS — C3491 Malignant neoplasm of unspecified part of right bronchus or lung: Secondary | ICD-10-CM

## 2024-06-13 DIAGNOSIS — Z79620 Long term (current) use of immunosuppressive biologic: Secondary | ICD-10-CM | POA: Diagnosis not present

## 2024-06-13 DIAGNOSIS — Z79631 Long term (current) use of antimetabolite agent: Secondary | ICD-10-CM | POA: Diagnosis not present

## 2024-06-13 DIAGNOSIS — C7931 Secondary malignant neoplasm of brain: Secondary | ICD-10-CM | POA: Diagnosis not present

## 2024-06-13 DIAGNOSIS — R6889 Other general symptoms and signs: Secondary | ICD-10-CM | POA: Diagnosis not present

## 2024-06-13 DIAGNOSIS — C3490 Malignant neoplasm of unspecified part of unspecified bronchus or lung: Secondary | ICD-10-CM | POA: Diagnosis not present

## 2024-06-13 DIAGNOSIS — Z510 Encounter for antineoplastic radiation therapy: Secondary | ICD-10-CM | POA: Diagnosis not present

## 2024-06-13 DIAGNOSIS — C3411 Malignant neoplasm of upper lobe, right bronchus or lung: Secondary | ICD-10-CM | POA: Diagnosis not present

## 2024-06-13 DIAGNOSIS — D709 Neutropenia, unspecified: Secondary | ICD-10-CM | POA: Diagnosis not present

## 2024-06-13 DIAGNOSIS — C7972 Secondary malignant neoplasm of left adrenal gland: Secondary | ICD-10-CM | POA: Diagnosis not present

## 2024-06-13 DIAGNOSIS — Z87891 Personal history of nicotine dependence: Secondary | ICD-10-CM | POA: Diagnosis not present

## 2024-06-13 DIAGNOSIS — Z5111 Encounter for antineoplastic chemotherapy: Secondary | ICD-10-CM | POA: Diagnosis not present

## 2024-06-13 DIAGNOSIS — Z7689 Persons encountering health services in other specified circumstances: Secondary | ICD-10-CM | POA: Diagnosis not present

## 2024-06-13 LAB — CBC WITH DIFFERENTIAL (CANCER CENTER ONLY)
Abs Immature Granulocytes: 0.04 K/uL (ref 0.00–0.07)
Basophils Absolute: 0 K/uL (ref 0.0–0.1)
Basophils Relative: 1 %
Eosinophils Absolute: 0.2 K/uL (ref 0.0–0.5)
Eosinophils Relative: 4 %
HCT: 29.6 % — ABNORMAL LOW (ref 39.0–52.0)
Hemoglobin: 9.6 g/dL — ABNORMAL LOW (ref 13.0–17.0)
Immature Granulocytes: 1 %
Lymphocytes Relative: 36 %
Lymphs Abs: 1.4 K/uL (ref 0.7–4.0)
MCH: 24.7 pg — ABNORMAL LOW (ref 26.0–34.0)
MCHC: 32.4 g/dL (ref 30.0–36.0)
MCV: 76.3 fL — ABNORMAL LOW (ref 80.0–100.0)
Monocytes Absolute: 0.4 K/uL (ref 0.1–1.0)
Monocytes Relative: 9 %
Neutro Abs: 2 K/uL (ref 1.7–7.7)
Neutrophils Relative %: 49 %
Platelet Count: 217 K/uL (ref 150–400)
RBC: 3.88 MIL/uL — ABNORMAL LOW (ref 4.22–5.81)
RDW: 16.2 % — ABNORMAL HIGH (ref 11.5–15.5)
WBC Count: 4 K/uL (ref 4.0–10.5)
nRBC: 0 % (ref 0.0–0.2)

## 2024-06-13 LAB — CMP (CANCER CENTER ONLY)
ALT: 17 U/L (ref 0–44)
AST: 20 U/L (ref 15–41)
Albumin: 3.5 g/dL (ref 3.5–5.0)
Alkaline Phosphatase: 68 U/L (ref 38–126)
Anion gap: 4 — ABNORMAL LOW (ref 5–15)
BUN: 17 mg/dL (ref 6–20)
CO2: 28 mmol/L (ref 22–32)
Calcium: 8.9 mg/dL (ref 8.9–10.3)
Chloride: 107 mmol/L (ref 98–111)
Creatinine: 0.81 mg/dL (ref 0.61–1.24)
GFR, Estimated: >60 mL/min (ref >60)
Glucose, Bld: 93 mg/dL (ref 70–99)
Potassium: 4.1 mmol/L (ref 3.5–5.1)
Sodium: 139 mmol/L (ref 135–145)
Total Bilirubin: 0.3 mg/dL (ref 0.0–1.2)
Total Protein: 6.8 g/dL (ref 6.5–8.1)

## 2024-06-13 LAB — TSH: TSH: 0.692 u[IU]/mL (ref 0.350–4.500)

## 2024-06-13 MED ORDER — SODIUM CHLORIDE 0.9 % IV SOLN
150.0000 mg | Freq: Once | INTRAVENOUS | Status: AC
  Administered 2024-06-13: 150 mg via INTRAVENOUS
  Filled 2024-06-13: qty 150

## 2024-06-13 MED ORDER — DEXAMETHASONE SODIUM PHOSPHATE 10 MG/ML IJ SOLN
10.0000 mg | Freq: Once | INTRAMUSCULAR | Status: AC
  Administered 2024-06-13: 10 mg via INTRAVENOUS
  Filled 2024-06-13: qty 1

## 2024-06-13 MED ORDER — SODIUM CHLORIDE 0.9 % IV SOLN
400.0000 mg/m2 | Freq: Once | INTRAVENOUS | Status: AC
  Administered 2024-06-13: 800 mg via INTRAVENOUS
  Filled 2024-06-13: qty 20

## 2024-06-13 MED ORDER — OXYCODONE-ACETAMINOPHEN 5-325 MG PO TABS: 1.0000 | ORAL_TABLET | Freq: Three times a day (TID) | ORAL | 0 refills | Status: DC | PRN

## 2024-06-13 MED ORDER — SODIUM CHLORIDE 0.9 % IV SOLN
551.6000 mg | Freq: Once | INTRAVENOUS | Status: AC
  Administered 2024-06-13: 550 mg via INTRAVENOUS
  Filled 2024-06-13: qty 55

## 2024-06-13 MED ORDER — DENOSUMAB 120 MG/1.7ML ~~LOC~~ SOLN
120.0000 mg | Freq: Once | SUBCUTANEOUS | Status: AC
  Administered 2024-06-13: 120 mg via SUBCUTANEOUS
  Filled 2024-06-13: qty 1.7

## 2024-06-13 MED ORDER — SODIUM CHLORIDE 0.9 % IV SOLN
350.0000 mg | Freq: Once | INTRAVENOUS | Status: AC
  Administered 2024-06-13: 350 mg via INTRAVENOUS
  Filled 2024-06-13: qty 7

## 2024-06-13 MED ORDER — CYANOCOBALAMIN 1000 MCG/ML IJ SOLN
1000.0000 ug | Freq: Once | INTRAMUSCULAR | Status: AC
  Administered 2024-06-13: 1000 ug via INTRAMUSCULAR
  Filled 2024-06-13: qty 1

## 2024-06-13 MED ORDER — SODIUM CHLORIDE 0.9 % IV SOLN
INTRAVENOUS | Status: DC
Start: 2024-06-13 — End: 2024-06-13

## 2024-06-13 MED ORDER — PALONOSETRON HCL INJECTION 0.25 MG/5ML
0.2500 mg | Freq: Once | INTRAVENOUS | Status: AC
  Administered 2024-06-13: 0.25 mg via INTRAVENOUS
  Filled 2024-06-13: qty 5

## 2024-06-13 NOTE — Patient Instructions (Signed)
 [facility] - A DEPT OF [facility]  Discharge Instructions: Thank you for choosing [facility] to provide your oncology and hematology care.   If you have a lab appointment with the Cancer Center, please go directly to the Cancer Center and check in at the registration area.   Wear comfortable clothing and clothing appropriate for easy access to any Portacath or PICC line.   We strive to give you quality time with your provider. You may need to reschedule your appointment if you arrive late (15 or more minutes).  Arriving late affects you and other patients whose appointments are after yours.  Also, if you miss three or more appointments without notifying the office, you may be dismissed from the clinic at the provider's discretion.      For prescription refill requests, have your pharmacy contact our office and allow 72 hours for refills to be completed.    Today you received the following chemotherapy and/or immunotherapy agents :  Cemiplimab , pemetrexed , & Carboplatin       To help prevent nausea and vomiting after your treatment, we encourage you to take your nausea medication as directed.  BELOW ARE SYMPTOMS THAT SHOULD BE REPORTED IMMEDIATELY: *FEVER GREATER THAN 100.4 F (38 C) OR HIGHER *CHILLS OR SWEATING *NAUSEA AND VOMITING THAT IS NOT CONTROLLED WITH YOUR NAUSEA MEDICATION *UNUSUAL SHORTNESS OF BREATH *UNUSUAL BRUISING OR BLEEDING *URINARY PROBLEMS (pain or burning when urinating, or frequent urination) *BOWEL PROBLEMS (unusual diarrhea, constipation, pain near the anus) TENDERNESS IN MOUTH AND THROAT WITH OR WITHOUT PRESENCE OF ULCERS (sore throat, sores in mouth, or a toothache) UNUSUAL RASH, SWELLING OR PAIN  UNUSUAL VAGINAL DISCHARGE OR ITCHING   Items with * indicate a potential emergency and should be followed up as soon as possible or go to the Emergency Department if any problems should occur.  Please show the  CHEMOTHERAPY ALERT CARD or IMMUNOTHERAPY ALERT CARD at check-in to the Emergency Department and triage nurse.  Should you have questions after your visit or need to cancel or reschedule your appointment, please contact [facility] - A DEPT OF [facility]  Dept: [phone]  and follow the prompts.  Office hours are 8:00 a.m. to 4:30 p.m. Monday - Friday. Please note that voicemails left after 4:00 p.m. may not be returned until the following business day.  We are closed weekends and major holidays. You have access to a nurse at all times for urgent questions. Please call the main number to the clinic Dept: [phone] and follow the prompts.   For any non-urgent questions, you may also contact your provider using MyChart. We now offer e-Visits for anyone 73 and older to request care online for non-urgent symptoms. For details visit [email or web address].   Also download the MyChart app! Go to the app store, search MyChart, open the app, select [facility], and log in with your MyChart username and password.

## 2024-06-13 NOTE — Progress Notes (Signed)
 [facility] [phone]   Fax:([phone]) [phone]  OFFICE PROGRESS NOTE  Benjamin Clam, MD [address] [place] [address] [place] [address]  DIAGNOSIS: Stage IV (T3, N3, M1 C) non-small cell lung cancer, adenocarcinoma presented with large right upper lobe lung mass in addition to right hilar and mediastinal as well as right supraclavicular lymphadenopathy and metastatic bone lesions including the left proximal humerus, L2 as well as sacral area and left adrenal metastasis diagnosed in June 2025.   Molecular studies show PDL1 of 0%   Biomarker Findings Blood Tumor Mutational Burden - 37 Muts/Mb ctDNA Tumor Fraction - High (2.4%) Microsatellite status - MSI-High Not Detected Genomic Findings For a complete list of the genes assayed, please refer to the Appendix. ARID1A E2250* KEAP1 G524C, G466fs*23 CTNNA1 splice site 2192+1G>C DNMT3A R882C TET2 K1197* TP53 splice site 813_993+[id number], deletion exons 8-9 This assay tested >300 cancer-related genes, including the following 8 gene(s) routinely assessed in this tumor type: ALK, BRAF, EGFR, ERBB2, KRAS, MET, RET, ROS1.     PRIOR THERAPY:  Palliative radiation to the painful bone lesion in the left humerus. Last day on 05/03/24.    CURRENT THERAPY:  Systemic chemotherapy with Carboplatin  for an AUC of 5, Alimta  500 mg/m2, and Libatyo IV every 3 weeks. First dose on 05/02/24. Doses of Alimta  reduced to 400 mg/m2 starting from cycle #2 due to HIV and neutropenia.  Status post 2 cycles.  INTERVAL HISTORY: Benjamin Herrera 58 y.o. male returns to the clinic today for follow-up visit. Discussed the use of AI scribe software for clinical note transcription with the patient, who gave verbal consent to proceed.  History of Present Illness Benjamin Herrera is a 57 year old male with stage four non-small cell lung cancer who presents for evaluation before starting cycle number three of chemotherapy.  Diagnosed with  stage four non-small cell lung cancer, adenocarcinoma, in June 2025, with no actionable mutation and negative PD-L1 expression but high tumor mutational burden. Undergoing treatment with palliative radiotherapy and chemotherapy. Chemotherapy regimen includes Alimta , reduced to 400 mg/m2 from the second cycle due to HIV and neutropenia.  Experienced nausea once, improved with anti-nausea medication. Decrease in white blood cell count and platelets after treatment, but these have since recovered. Recent weight gain, a positive change from previous weight loss.  Scheduled to start stereotactic radiosurgery [facility]) for small spots found on a brain MRI, with three sessions planned starting Friday.  Ongoing pain and limited movement in left arm, previously treated with radiation. Consulting with an orthopedic surgeon regarding potential bone deterioration.  No other significant side effects from the treatment.     MEDICAL HISTORY: Past Medical History:  Diagnosis Date   Achalasia and cardiospasm    Allergy    Anxiety    Arthritis    back ,shoulders    Asthma    Boils    under arms hx of   CAD (coronary artery disease)    a. 09/2016: cath showing 100% RCA occlusion with collaterals and 90% OM1 (treated with DES)   Candidiasis of mouth    Candidiasis of unspecified site    Cholelithiasis    Cocaine abuse (HCC)    Quit 09/2016   Condyloma acuminatum in male of scrotum & anal canal s/p laser ablation 07/03/2012   Coronary artery disease    Dysphagia    Fracture, humerus    Frozen shoulder    left   GERD (gastroesophageal reflux disease)  Heart attack (HCC)    Hemorrhoids    Hepatitis 1993   A   Herpes labialis    HIV (human immunodeficiency virus infection) (HCC) DX 1993   Hypertension Dx 2014   off all bp meds for last 9 or 10 months   Pilonidal disease 01/10/2012   Polysubstance abuse (HCC)    Psoriasis    hx of   Sleep apnea    does  have CPAP   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  MEDICATIONS:  Current Outpatient Medications  Medication Sig Dispense Refill   acetaminophen  (TYLENOL ) 325 MG tablet Take 650 mg by mouth every 6 (six) hours as needed for mild pain (pain score 1-3), moderate pain (pain score 4-6) or headache.     albuterol  (VENTOLIN  HFA) 108 (90 Base) MCG/ACT inhaler Inhale 2 puffs into the lungs every 6 (six) hours as needed for wheezing or shortness of breath. 1 each 2   amLODipine  (NORVASC ) 5 MG tablet Take 1 tablet (5 mg total) by mouth daily. 90 tablet 1   amoxicillin -clavulanate (AUGMENTIN ) 875-125 MG tablet Take 1 tablet by mouth 2 (two) times daily. 20 tablet 0   aspirin  81 MG chewable tablet Chew 1 tablet (81 mg total) by mouth daily. 90 tablet 3   Bictegravir-Emtricitab-Tenofov (BIKTARVY ) 30-120-15 MG TABS Take 1 tablet by mouth daily. 90 tablet 3   Blood Pressure KIT 1 kit by Does not apply route daily. 1 kit 0   diclofenac  Sodium (VOLTAREN  ARTHRITIS PAIN) 1 % GEL Apply 2 g topically 4 (four) times daily. 100 g 3   folic acid  (FOLVITE ) 1 MG tablet Take 1 tablet (1 mg total) by mouth daily. Start 7 days before pemetrexed  chemotherapy. Continue until 21 days after pemetrexed  completed. 100 tablet 3   HYDROcodone -acetaminophen  (NORCO/VICODIN) 5-325 MG tablet Take 1 tablet by mouth every 6 (six) hours as needed.     hydrOXYzine  (ATARAX ) 25 MG tablet Take 1 tablet (25 mg total) by mouth 3 (three) times daily as needed. 30 tablet 0   lidocaine  (XYLOCAINE ) 2 % solution SWISH AND SWALLOW 5 MLS 3 (THREE) TIMES DAILY AS NEEDED FOR MOUTH PAIN. 360 mL 1   lidocaine -prilocaine  (EMLA ) cream Apply to affected area once 30 g 3   LORazepam  (ATIVAN ) 0.5 MG tablet 1 tab po 30 minutes prior to radiation or MRI scans 4 tablet 0   magic mouthwash (nystatin , diphenhydrAMINE , alum & mag hydroxide)  suspension mixture Take 5 mLs by mouth 3 (three) times daily as needed for mouth pain. 240 mL 1   methocarbamol  (ROBAXIN ) 500 MG tablet Take 2 tablets (1,000 mg total) by mouth every 8 (eight) hours as needed. 90 tablet 1   metoprolol  succinate (TOPROL -XL) 25 MG 24 hr tablet TAKE 1/2 TABLET BY MOUTH DAILY 45 tablet 0   nitroGLYCERIN  (NITROSTAT ) 0.4 MG SL tablet Place 1 tablet (0.4 mg total) under the tongue every 5 (five) minutes as needed for chest pain (trouble swallowing). 25 tablet 1   ondansetron  (ZOFRAN ) 8 MG tablet Take 1 tablet (8 mg total) by mouth every 8 (eight) hours as needed for nausea or vomiting. Start on the third day after carboplatin . 30 tablet 1   oxyCODONE -acetaminophen  (PERCOCET/ROXICET) 5-325 MG tablet Take 1 tablet by mouth every 8 (eight) hours as needed for severe pain (pain score 7-10). 30 tablet  0   prochlorperazine  (COMPAZINE ) 10 MG tablet Take 1 tablet (10 mg total) by mouth every 6 (six) hours as needed for nausea or vomiting. 30 tablet 1   No current facility-administered medications for this visit.    SURGICAL HISTORY:  Past Surgical History:  Procedure Laterality Date   BALLOON DILATION N/A 11/11/2015   Procedure: BALLOON DILATION;  Surgeon: Gustav Shila GAILS, MD;  Location: [facility];  Service: Endoscopy;  Laterality: N/A;   BOTOX  INJECTION  10/11/2012   Procedure: BOTOX  INJECTION;  Surgeon: Benjamin JONETTA Aho, MD;  Location: [facility];  Service: Endoscopy;  Laterality: N/A;   BOTOX  INJECTION N/A 01/25/2013   Procedure: BOTOX  INJECTION;  Surgeon: Benjamin JONETTA Aho, MD;  Location: [facility];  Service: Endoscopy;  Laterality: N/A;   BOTOX  INJECTION N/A 06/21/2013   Procedure: BOTOX  INJECTION;  Surgeon: Benjamin JONETTA Aho, MD;  Location: [facility];  Service: Endoscopy;  Laterality: N/A;   BOTOX  INJECTION N/A 10/08/2013   Procedure: BOTOX  INJECTION;  Surgeon: Benjamin JONETTA Aho, MD;  Location: [facility];  Service: Endoscopy;  Laterality: N/A;   BOTOX  INJECTION  N/A 06/16/2015   Procedure: BOTOX  INJECTION;  Surgeon: Benjamin JONETTA Aho, MD;  Location: [facility];  Service: Endoscopy;  Laterality: N/A;   BRONCHIAL BIOPSY  04/03/2024   Procedure: BRONCHOSCOPY, WITH BIOPSY;  Surgeon: Benjamin Benjamin RAMAN, MD;  Location: [facility] ENDOSCOPY;  Service: Pulmonary;;   BRONCHIAL BRUSHINGS  04/03/2024   Procedure: BRONCHOSCOPY, WITH BRUSH BIOPSY;  Surgeon: Benjamin Benjamin RAMAN, MD;  Location: [facility] ENDOSCOPY;  Service: Pulmonary;;   BRONCHIAL NEEDLE ASPIRATION BIOPSY  04/03/2024   Procedure: BRONCHOSCOPY, WITH NEEDLE ASPIRATION BIOPSY;  Surgeon: Benjamin Benjamin RAMAN, MD;  Location: [facility] ENDOSCOPY;  Service: Pulmonary;;   BRONCHOSCOPY, WITH BIOPSY USING ELECTROMAGNETIC NAVIGATION Right 04/03/2024   Procedure: BRONCHOSCOPY, WITH BIOPSY USING ELECTROMAGNETIC NAVIGATION;  Surgeon: Benjamin Benjamin RAMAN, MD;  Location: [facility] ENDOSCOPY;  Service: Pulmonary;  Laterality: Right;   CARDIAC CATHETERIZATION N/A 10/11/2016   Procedure: Left Heart Cath and Coronary Angiography;  Surgeon: Lonni JONETTA Cash, MD;  mRCA 100% w/ L>R collaterals, OM1 90%   CARDIAC CATHETERIZATION N/A 10/11/2016   Procedure: Coronary Stent Intervention;  Surgeon: Lonni JONETTA Cash, MD;  RESOLUTE ONYX 3.5X15 DES OM1   COLONOSCOPY  05/19/2012   Procedure: COLONOSCOPY;  Surgeon: Benjamin JONETTA Aho, MD;  Location: [facility];  Service: Endoscopy;  Laterality: N/A;  jill trying to contact pt to come in 0830 for 930 case, phone not accepting messages   COLONOSCOPY     ESOPHAGEAL MANOMETRY  05/29/2012   Procedure: ESOPHAGEAL MANOMETRY (EM);  Surgeon: Benjamin JONETTA Aho, MD;  Location: [facility];  Service: Endoscopy;  Laterality: N/A;   ESOPHAGEAL MANOMETRY N/A 10/06/2015   Procedure: ESOPHAGEAL MANOMETRY (EM);  Surgeon: Gustav Shila GAILS, MD;  Location: [facility];  Service: Endoscopy;  Laterality: N/A;   ESOPHAGOGASTRODUODENOSCOPY  11/11/2011   Procedure: ESOPHAGOGASTRODUODENOSCOPY (EGD);  Surgeon: Benjamin JONETTA Aho, MD;  Location: [facility];  Service: Endoscopy;  Laterality: N/A;  botox  injection  called Pt to change time of procedure per Dr Herrera   ESOPHAGOGASTRODUODENOSCOPY  03/10/2012   Procedure: ESOPHAGOGASTRODUODENOSCOPY (EGD);  Surgeon: Benjamin JONETTA Aho, MD;  Location: [facility];  Service: Endoscopy;  Laterality: N/A;   ESOPHAGOGASTRODUODENOSCOPY  10/11/2012   Procedure: ESOPHAGOGASTRODUODENOSCOPY (EGD);  Surgeon: Benjamin JONETTA Aho, MD;  Location: [facility];  Service: Endoscopy;  Laterality: N/A;   ESOPHAGOGASTRODUODENOSCOPY N/A 01/25/2013   Procedure: ESOPHAGOGASTRODUODENOSCOPY (EGD);  Surgeon: Benjamin JONETTA Aho, MD;  Location: [facility];  Service:  Endoscopy;  Laterality: N/A;   ESOPHAGOGASTRODUODENOSCOPY N/A 06/21/2013   Procedure: ESOPHAGOGASTRODUODENOSCOPY (EGD);  Surgeon: Benjamin JONETTA Aho, MD;  Location: [facility];  Service: Endoscopy;  Laterality: N/A;   ESOPHAGOGASTRODUODENOSCOPY (EGD) WITH PROPOFOL  N/A 10/08/2013   Procedure: ESOPHAGOGASTRODUODENOSCOPY (EGD) WITH PROPOFOL ;  Surgeon: Benjamin JONETTA Aho, MD;  Location: [facility];  Service: Endoscopy;  Laterality: N/A;   ESOPHAGOGASTRODUODENOSCOPY (EGD) WITH PROPOFOL  N/A 06/16/2015   Procedure: ESOPHAGOGASTRODUODENOSCOPY (EGD) WITH PROPOFOL ;  Surgeon: Benjamin JONETTA Aho, MD;  Location: [facility];  Service: Endoscopy;  Laterality: N/A;   ESOPHAGOGASTRODUODENOSCOPY (EGD) WITH PROPOFOL  N/A 11/11/2015   Procedure: ESOPHAGOGASTRODUODENOSCOPY (EGD) WITH PROPOFOL ;  Surgeon: Gustav Shila GAILS, MD;  Location: [facility];  Service: Endoscopy;  Laterality: N/A;   EXAMINATION UNDER ANESTHESIA  07/24/2012   Procedure: EXAM UNDER ANESTHESIA;  Surgeon: Benjamin KYM Schultze, MD;  Location: [facility];  Service: General;  Laterality: N/A;   humeral fracture surgery Right yrs ago   PILONIDAL CYST EXCISION  07/24/2012   Procedure: CYST EXCISION PILONIDAL SIMPLE;  Surgeon: Benjamin KYM Schultze, MD;  Location: [facility];  Service: General;  Laterality: N/A;  Exam Under Anesthesia,, Excision  Pilonidal Disease,    right shoulder replacement  2008   x 2   UPPER GASTROINTESTINAL ENDOSCOPY     WART FULGURATION  07/24/2012   Procedure: FULGURATION ANAL WART;  Surgeon: Benjamin KYM Schultze, MD;  Location: [facility];  Service: General;  Laterality: N/A;  excision of raphe mass    REVIEW OF SYSTEMS:  Constitutional: positive for fatigue Eyes: negative Ears, nose, mouth, throat, and face: negative Respiratory: negative Cardiovascular: negative Gastrointestinal: positive for nausea Genitourinary:negative Integument/breast: negative Hematologic/lymphatic: negative Musculoskeletal:positive for bone pain Neurological: negative Behavioral/Psych: negative Endocrine: negative Allergic/Immunologic: negative   PHYSICAL EXAMINATION: General appearance: alert, cooperative, fatigued, and no distress Head: Normocephalic, without obvious abnormality, atraumatic Neck: no adenopathy, no JVD, supple, symmetrical, trachea midline, and thyroid  not enlarged, symmetric, no tenderness/mass/nodules Lymph nodes: Cervical, supraclavicular, and axillary nodes normal. Resp: clear to auscultation bilaterally Back: symmetric, no curvature. ROM normal. No CVA tenderness. Cardio: regular rate and rhythm, S1, S2 normal, no murmur, click, rub or gallop GI: soft, non-tender; bowel sounds normal; no masses,  no organomegaly Extremities: extremities normal, atraumatic, no cyanosis or edema Neurologic: Alert and oriented X 3, normal strength and tone. Normal symmetric reflexes. Normal coordination and gait  ECOG PERFORMANCE STATUS: 1 - Symptomatic but completely ambulatory  There were no vitals taken for this visit.  LABORATORY DATA: Lab Results  Component Value Date   WBC 4.0 06/13/2024   HGB 9.6 (L) 06/13/2024   HCT 29.6 (L) 06/13/2024   MCV 76.3 (L) 06/13/2024   PLT 217 06/13/2024      Chemistry      Component Value Date/Time   NA 139 06/13/2024 0926   NA 139 01/05/2024 1421   K 4.1 06/13/2024 0926    CL 107 06/13/2024 0926   CO2 28 06/13/2024 0926   BUN 17 06/13/2024 0926   BUN 16 01/05/2024 1421   CREATININE 0.81 06/13/2024 0926   CREATININE 0.96 07/30/2020 1437      Component Value Date/Time   CALCIUM  8.9 06/13/2024 0926   ALKPHOS 68 06/13/2024 0926   AST 20 06/13/2024 0926   ALT 17 06/13/2024 0926   BILITOT 0.3 06/13/2024 0926       RADIOGRAPHIC STUDIES: MR Brain W Wo Contrast Result Date: 06/01/2024 EXAM: MRI BRAIN WITH AND WITHOUT CONTRAST 05/29/2024 04:37:46 PM TECHNIQUE: Multiplanar multisequence MRI of the head/brain was performed with  and without the administration of intravenous contrast. COMPARISON: MRI head 04/06/2024 CLINICAL HISTORY: Non-small cell lung cancer (NSCLC), monitor; 3T MRI SRS protocol. FINDINGS: BRAIN AND VENTRICLES: No acute infarct. No evidence of intracranial hemorrhage. No edema, mass effect, or midline shift. Minimal nonspecific punctate foci of T2/FLAIR hyperintensity in the periventricular and subcortical white matter. Redemonstrated enhancing pineal mass which measures approximately 1.2 x 1.0 x 1.0 cm on the current study, previously 1.2 x 1.0 x 0.9 cm. Mass abuts the tectum of the midbrain without mass effect on the cerebral aqueduct. The superior and slightly posterior aspect of the mass abuts the internal cerebral veins. Associated areas of susceptibility suggestive of calcification. There is a 1.5 mm focus of enhancement along the cortex within the superior right frontal lobe near the vertex with likely punctate focus of enhancement at this site on the prior study which is now increased in size appreciated on series 1100 image 255. Additional punctate focus of enhancement along the cortex of the right precentral gyrus is new from prior seen on 1100 image 236. 3 mm focus enhancement along the anterior parasagittal left frontal lobe which may be extraaxial and vascular in etiology (series 1100 image 200). ORBITS: No acute abnormality. SINUSES: Mild mucosal  thickening in the ethmoid sinuses and right maxillary sinus. Similar large defect in the nasal septum. BONES AND SOFT TISSUES: Similar appearance of bilateral parotid masses. IMPRESSION: 1. New punctate focus of enhancement in the right precentral gyrus and increased focus of enhancement in the superior right frontal lobe near the vertex. Findings could reflect metastatic disease. However, the degree of vessel suppression on the current study is less thorough and these could reflect small foci of vascular enhancement. 2. 3 mm focus of enhancement along the anterior parasagittal left frontal lobe, possibly extra-axial and vascular in etiology. 3. Stable enhancing pineal mass measuring approximately 1.2 x 1.0 x 1.0 cm, abutting the tectum of the midbrain and internal cerebral veins. No mass effect on the cerebral aqueduct. 4. Similar appearance of bilateral parotid masses. Electronically signed by: Benjamin Mania MD 06/01/2024 02:38 PM EDT RP Workstation: [id number]    ASSESSMENT AND PLAN: This is a 58 years old African-American male with Stage IV (T3, N3, M1 C) non-small cell lung cancer, adenocarcinoma presented with large right upper lobe lung mass in addition to right hilar and mediastinal as well as right supraclavicular lymphadenopathy and metastatic bone lesions including the left proximal humerus, L2 as well as sacral area and left adrenal metastasis diagnosed in June 2025. Molecular studies showed no actionable mutations and has negative PD-L1 expression but high TMB He is currently undergoing systemic chemotherapy with reduced dose carboplatin  for AUC of 4 and pemetrexed  400 Mg/M2 in addition to Libtayo  (Cempilimab) 350 Mg IV every 3 weeks.  Status post 2 cycles.  He is here today for evaluation before starting cycle #3.  He is also on treatment with Xgeva  for bone metastasis.  Assessment and Plan Assessment & Plan Stage IV non-small cell lung cancer, adenocarcinoma with brain and bone  metastases Stage IV non-small cell lung cancer, adenocarcinoma diagnosed in June 2025. No actionable mutation and negative PD-L1 expression but high DMB. Currently undergoing palliative treatment. Brain MRI revealed small metastatic spots. Weight gain noted, indicating possible stabilization or improvement. - Administer cycle 3 of chemotherapy today - Schedule scan of chest, abdomen, and pelvis in 2 weeks - Start [facility] for brain metastases on Friday  Chemotherapy-induced neutropenia, resolved Neutropenia observed after previous chemotherapy cycles but has resolved. White  blood count and platelets have recovered to acceptable levels.  Chemotherapy-induced nausea, resolved Experienced nausea once, which resolved with anti-nausea medication.  Left arm pain and limited movement due to bone metastasis Left arm pain and limited movement due to bone metastasis. Persistent pain and limited movement noted. Orthopedic consultation ongoing. - Scan left arm to assess current status - Continue follow-up with orthopedic surgeon The patient was advised to call immediately if she has any concerning symptoms in the interval. The patient voices understanding of current disease status and treatment options and is in agreement with the current care plan.  All questions were answered. The patient knows to call the clinic with any problems, questions or concerns. We can certainly see the patient much sooner if necessary.  The total time spent in the appointment was 30 minutes including review of chart and various tests results, discussions about plan of care and coordination of care plan .  Disclaimer: This note was dictated with voice recognition software. Similar sounding words can inadvertently be transcribed and may not be corrected upon review.

## 2024-06-13 NOTE — Progress Notes (Signed)
 Nutrition Follow-up:  Patient with stage IV non-small cell lung cancer. S/p palliative radiation to bone lesion in left humerus (final 7/10). Planning SRS to brain. He is receiving systemic chemoimmunotherapy with carboplatin /alimta  + libatyo q21d. Reduced dose alimta  (start cycle 2) secondary to HIV + neutropenia.   Met with patient in infusion. He is doing well today. Tolerated cycle 2 with less nausea/anorexia. This lasted a few days vs entire cycle. Patient says his appetite and intake are great. Patient received shipment of Ensure. He is grateful for assistance with obtaining these. Drinking at least 3/day. Patient reports taking stool softener + dulcolax for constipation. Bowels moving every 3-4 days with current regimen. He does not feel dulcolax is beneficial. Patient willing to try miralax.    Medications: reviewed   Labs: Hgb 9.6  Anthropometrics: Wt 191 lb today - increased (pt denies swelling/fluid accumulation in extremities)  8/14 - 182 lb 4 oz  7/30 - 177 lb  6/20 - 195 lb 5/28 - 202 lb    Estimated Energy Needs  Kcals: 2700-2900 Protein: 120-140 Fluid: >2.7 L  NUTRITION DIAGNOSIS: Unintended wt loss - improved    INTERVENTION:  Continue 3-4 ensure plus/equivalent  Pt to trial stool softener + miralax for constipation    MONITORING, EVALUATION, GOAL: wt trends, intake   NEXT VISIT: To be scheduled as needed

## 2024-06-13 NOTE — Telephone Encounter (Signed)
All orders has been submitted for patient

## 2024-06-14 ENCOUNTER — Encounter: Payer: Self-pay | Admitting: Physician Assistant

## 2024-06-14 ENCOUNTER — Encounter: Payer: Self-pay | Admitting: Nurse Practitioner

## 2024-06-14 ENCOUNTER — Ambulatory Visit: Admitting: Radiation Oncology

## 2024-06-14 ENCOUNTER — Encounter: Payer: Self-pay | Admitting: Internal Medicine

## 2024-06-14 DIAGNOSIS — R6889 Other general symptoms and signs: Secondary | ICD-10-CM | POA: Diagnosis not present

## 2024-06-14 LAB — T4: T4, Total: 6.7 ug/dL (ref 4.5–12.0)

## 2024-06-14 NOTE — Progress Notes (Signed)
 Palliative Medicine [facility]  Telephone:(336) [phone] Fax:(336) [phone]   Name: Benjamin Herrera Date: 06/14/2024 MRN: [id number]  DOB: 03-03-1966  Patient Care Team: [name], MD as PCP - General (Family Medicine) Eben Reyes [name], MD as PCP - Infectious Diseases (Infectious Diseases) Mona Vinie [name], MD as PCP - Cardiology (Cardiology) Debrah Lamar [name], MD (Inactive) as Consulting Physician (Gastroenterology) [name], MD as Consulting Physician (General Surgery) [name], RN as Oncology Nurse Navigator    INTERVAL HISTORY: Benjamin Herrera is a 58 y.o. male with medical problems include: HIV, hypertension, coronary artery disease (hx MI, status post DES to OM1 in 2017), chronic low back pain, SCC of skin, OSA, polysubstance abuse, GERD, asthma, anxiety, arthritis, more recently he was diagnosed with Adenocarcinoma of right lung, stage 4.   SOCIAL HISTORY:     reports that he has quit smoking. His smoking use included cigarettes. He started smoking about 42 years ago. He has a 42.6 pack-year smoking history. He has never used smokeless tobacco. He reports that he does not currently use drugs after having used the following drugs: Marijuana. Frequency: 3.00 times per week. He reports that he does not drink alcohol.  ADVANCE DIRECTIVES:  Full code full scope. Plans to meet with social work to establish advance directives. Referral to social work for this placed at initial visit. Pending completion of forms.   CODE STATUS: Full code  PAST MEDICAL HISTORY: Past Medical History:  Diagnosis Date   Achalasia and cardiospasm    Allergy    Anxiety    Arthritis    back ,shoulders    Asthma    Boils    under arms hx of   CAD (coronary artery disease)    a. 09/2016: cath showing 100% RCA occlusion with collaterals and 90% OM1 (treated with DES)   Candidiasis of mouth    Candidiasis of unspecified site    Cholelithiasis    Cocaine abuse  (HCC)    Quit 09/2016   Condyloma acuminatum in male of scrotum & anal canal s/p laser ablation 07/03/2012   Coronary artery disease    Dysphagia    Fracture, humerus    Frozen shoulder    left   GERD (gastroesophageal reflux disease)    Heart attack (HCC)    Hemorrhoids    Hepatitis 1993   A   Herpes labialis    HIV (human immunodeficiency virus infection) (HCC) DX 1993   Hypertension Dx 2014   off all bp meds for last 9 or 10 months   Pilonidal disease 01/10/2012   Polysubstance abuse (HCC)    Psoriasis    hx of   Sleep apnea    does  have CPAP   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  MEDICATIONS:  Current Outpatient Medications  Medication Sig Dispense Refill   acetaminophen  (TYLENOL ) 325 MG tablet Take 650 mg by mouth every 6 (six) hours as needed for mild pain (pain score 1-3), moderate pain (pain score 4-6) or headache.     albuterol  (VENTOLIN  HFA) 108 (90 Base) MCG/ACT inhaler Inhale 2 puffs into the lungs every 6 (six) hours as needed for wheezing or shortness of breath. 1 each 2   amLODipine  (NORVASC ) 5 MG tablet Take 1 tablet (5 mg total) by mouth daily. 90  tablet 1   amoxicillin -clavulanate (AUGMENTIN ) 875-125 MG tablet Take 1 tablet by mouth 2 (two) times daily. 20 tablet 0   aspirin  81 MG chewable tablet Chew 1 tablet (81 mg total) by mouth daily. 90 tablet 3   Bictegravir-Emtricitab-Tenofov (BIKTARVY ) 30-120-15 MG TABS Take 1 tablet by mouth daily. 90 tablet 3   Blood Pressure KIT 1 kit by Does not apply route daily. 1 kit 0   diclofenac  Sodium (VOLTAREN  ARTHRITIS PAIN) 1 % GEL Apply 2 g topically 4 (four) times daily. 100 g 3   folic acid  (FOLVITE ) 1 MG tablet Take 1 tablet (1 mg total) by mouth daily. Start 7 days before pemetrexed  chemotherapy. Continue until 21 days after pemetrexed  completed. 100  tablet 3   hydrOXYzine  (ATARAX ) 25 MG tablet Take 1 tablet (25 mg total) by mouth 3 (three) times daily as needed. 30 tablet 0   lidocaine  (XYLOCAINE ) 2 % solution SWISH AND SWALLOW 5 MLS 3 (THREE) TIMES DAILY AS NEEDED FOR MOUTH PAIN. 360 mL 1   lidocaine -prilocaine  (EMLA ) cream Apply to affected area once 30 g 3   LORazepam  (ATIVAN ) 0.5 MG tablet 1 tab po 30 minutes prior to radiation or MRI scans 4 tablet 0   magic mouthwash (nystatin , diphenhydrAMINE , alum & mag hydroxide) suspension mixture Take 5 mLs by mouth 3 (three) times daily as needed for mouth pain. 240 mL 1   methocarbamol  (ROBAXIN ) 500 MG tablet Take 2 tablets (1,000 mg total) by mouth every 8 (eight) hours as needed. 90 tablet 1   metoprolol  succinate (TOPROL -XL) 25 MG 24 hr tablet TAKE 1/2 TABLET BY MOUTH DAILY 45 tablet 0   nitroGLYCERIN  (NITROSTAT ) 0.4 MG SL tablet Place 1 tablet (0.4 mg total) under the tongue every 5 (five) minutes as needed for chest pain (trouble swallowing). 25 tablet 1   ondansetron  (ZOFRAN ) 8 MG tablet Take 1 tablet (8 mg total) by mouth every 8 (eight) hours as needed for nausea or vomiting. Start on the third day after carboplatin . 30 tablet 1   oxyCODONE -acetaminophen  (PERCOCET/ROXICET) 5-325 MG tablet Take 1 tablet by mouth every 8 (eight) hours as needed for severe pain (pain score 7-10). 30 tablet 0   prochlorperazine  (COMPAZINE ) 10 MG tablet Take 1 tablet (10 mg total) by mouth every 6 (six) hours as needed for nausea or vomiting. 30 tablet 1   No current facility-administered medications for this visit.    VITAL SIGNS: There were no vitals taken for this visit. There were no vitals filed for this visit.  Estimated body mass index is 25.2 kg/m as calculated from the following:   Height as of an earlier encounter on 06/13/24: 6' 1 (1.854 m).   Weight as of an earlier encounter on 06/13/24: 191 lb (86.6 kg).  PERFORMANCE STATUS (ECOG) : 1 - Symptomatic but completely ambulatory  Physical  Exam General: NAD Cardiovascular: regular rate and rhythm Pulmonary: normal breathing pattern Extremities: no edema, no joint deformities Skin: no rashes Neurological: AAO x3  IMPRESSION: Discussed the use of AI scribe software for clinical note transcription with the patient, who gave verbal consent to proceed.  History of Present Illness Benjamin Herrera is a 58 year old male who was seen during infusion for follow-up. No acute distress noted. Denies concerns for nausea, vomiting, constipation, or diarrhea. He is doing well overall. Reports appetite is much improved. His weight is up to 191lbs from 182lbs on 8/14.  He experiences significant pain in his arm and back following a fall while  getting into bed. The pain is severe enough to impact his daily activities, making life difficult. Unfortunately his fall was to his left side which is also impacted by his cancer. His left arm is in a sling.   Benjamin Herrera's pain is controlled with oxycodone  as needed. He denies illicit drugs at this time. He is taking medications as prescribed and appropriately.   We will continue to closely follow and support. No adjustments to regimen.   Assessment & Plan Cancer related pain - Chronic and improved post radiation, has Percocet 5-325 he can take every 8 hrs PRN which is effective. -Continue Percocet at current dose - Refill already at pharmacy awaiting pickup - UDS completed today and negative, will continue to monitor periodically - Encouraged him to call when he has only 2-3 days of medication left to request refil -Continue to monitor pain levels and adjust regimen as indicated   Appetite and weight loss Improving. Weight has increased up to 191 lbs.  -Continue nutritional supplements  - Encouraged small, frequent snacks/meals with high protein/fat content   I will plan to see patient back in 2-3 weeks. Sooner if needed.   Patient expressed understanding and was in agreement with this plan. He also  understands that He can call the clinic at any time with any questions, concerns, or complaints.   Any controlled substances utilized were prescribed in the context of palliative care. PDMP has been reviewed.   Visit consisted of counseling and education dealing with the complex and emotionally intense issues of symptom management and palliative care in the setting of serious and potentially life-threatening illness.  Benjamin Herrera, AGPCNP-BC  Palliative Medicine Team/[facility]

## 2024-06-15 ENCOUNTER — Other Ambulatory Visit: Payer: Self-pay

## 2024-06-15 ENCOUNTER — Ambulatory Visit
Admission: RE | Admit: 2024-06-15 | Discharge: 2024-06-15 | Disposition: A | Discharge status: Home / Self Care | Source: Ambulatory Visit | Attending: Radiation Oncology | Admitting: Radiation Oncology

## 2024-06-15 DIAGNOSIS — C3411 Malignant neoplasm of upper lobe, right bronchus or lung: Secondary | ICD-10-CM | POA: Diagnosis not present

## 2024-06-15 DIAGNOSIS — C7951 Secondary malignant neoplasm of bone: Secondary | ICD-10-CM | POA: Diagnosis not present

## 2024-06-15 DIAGNOSIS — Z7689 Persons encountering health services in other specified circumstances: Secondary | ICD-10-CM | POA: Diagnosis not present

## 2024-06-15 DIAGNOSIS — C7972 Secondary malignant neoplasm of left adrenal gland: Secondary | ICD-10-CM | POA: Diagnosis not present

## 2024-06-15 DIAGNOSIS — Z5111 Encounter for antineoplastic chemotherapy: Secondary | ICD-10-CM | POA: Diagnosis not present

## 2024-06-15 DIAGNOSIS — Z79631 Long term (current) use of antimetabolite agent: Secondary | ICD-10-CM | POA: Diagnosis not present

## 2024-06-15 DIAGNOSIS — D709 Neutropenia, unspecified: Secondary | ICD-10-CM | POA: Diagnosis not present

## 2024-06-15 DIAGNOSIS — I10 Essential (primary) hypertension: Secondary | ICD-10-CM | POA: Diagnosis not present

## 2024-06-15 DIAGNOSIS — Z87891 Personal history of nicotine dependence: Secondary | ICD-10-CM | POA: Diagnosis not present

## 2024-06-15 DIAGNOSIS — G4733 Obstructive sleep apnea (adult) (pediatric): Secondary | ICD-10-CM | POA: Diagnosis not present

## 2024-06-15 DIAGNOSIS — Z79630 Long term (current) use of alkylating agent: Secondary | ICD-10-CM | POA: Diagnosis not present

## 2024-06-15 DIAGNOSIS — Z79620 Long term (current) use of immunosuppressive biologic: Secondary | ICD-10-CM | POA: Diagnosis not present

## 2024-06-15 DIAGNOSIS — Z510 Encounter for antineoplastic radiation therapy: Secondary | ICD-10-CM | POA: Diagnosis not present

## 2024-06-15 DIAGNOSIS — R6889 Other general symptoms and signs: Secondary | ICD-10-CM | POA: Diagnosis not present

## 2024-06-15 DIAGNOSIS — G4710 Hypersomnia, unspecified: Secondary | ICD-10-CM | POA: Diagnosis not present

## 2024-06-15 DIAGNOSIS — C7931 Secondary malignant neoplasm of brain: Secondary | ICD-10-CM | POA: Diagnosis not present

## 2024-06-15 LAB — RAD ONC ARIA SESSION SUMMARY
Course Elapsed Days: 0
Plan Fractions Treated to Date: 1
Plan Prescribed Dose Per Fraction: 9 Gy
Plan Total Fractions Prescribed: 3
Plan Total Prescribed Dose: 27 Gy
Reference Point Dosage Given to Date: 9 Gy
Reference Point Session Dosage Given: 9 Gy
Session Number: 1

## 2024-06-15 NOTE — Op Note (Signed)
 Name: Benjamin Herrera    MRN: [id number]   Date: 06/15/2024    DOB: December 09, 1965   STEREOTACTIC RADIOSURGERY OPERATIVE NOTE  PRE-OPERATIVE DIAGNOSIS:  Metastatic lung cancer  POST-OPERATIVE DIAGNOSIS:  Same  PROCEDURE:  Stereotactic Radiosurgery  SURGEON:  Gerldine Maizes, MD  RADIATION ONCOLOGIST: Dr. Norleen Limes, MD  TECHNIQUE:  The patient underwent a radiation treatment planning session in the radiation oncology simulation suite under the care of the radiation oncology physician and physicist.  I participated closely in the radiation treatment planning afterwards. The patient underwent planning CT which was fused to 3T high resolution MRI with 1 mm axial slices.  These images were fused on the planning system.  We contoured the gross target volumes and subsequently expanded this to yield the Planning Target Volume. I actively participated in the planning process.  I helped to define and review the target contours and also the contours of the optic pathway, eyes, brainstem and selected nearby organs at risk.  All the dose constraints for critical structures were reviewed and compared to AAPM Task Group 101.  The prescription dose conformity was reviewed.  I approved the plan electronically.    Accordingly, Benjamin Herrera  was brought to the TrueBeam stereotactic radiation treatment linac and placed in the custom immobilization mask.  The patient was aligned according to the IR fiducial markers with BrainLab Exactrac, then orthogonal x-rays were used in ExacTrac with the 6DOF robotic table and the shifts were made to align the patient  Benjamin Herrera received #1 of 3 planned fractions of stereotactic radiosurgery to a prescription dose of 9Gy uneventfully.    The detailed description of the procedure is recorded in the radiation oncology procedure note.  I was present for the duration of the procedure.  DISPOSITION:   Following delivery, the patient was transported to nursing in stable  condition and monitored for possible acute effects to be discharged to home in stable condition with follow-up in one month.  Gerldine Maizes, MD [facility]

## 2024-06-15 NOTE — Progress Notes (Signed)
 Mr. Vanputten rested with us  for 15 minutes following his SRS treatment.  Patient denies headache, dizziness, nausea, diplopia or ringing in the ears. Denies fatigue. Patient without complaints. Understands to avoid strenuous activity for the next 24 hours and call [phone] with needs.   BP 120/66 (BP Location: Left Arm)   Pulse 90   Temp (!) 97.1 F (36.2 C) (Temporal)   Resp 18   SpO2 98%

## 2024-06-18 ENCOUNTER — Ambulatory Visit
Admission: RE | Admit: 2024-06-18 | Discharge: 2024-06-18 | Disposition: A | Discharge status: Home / Self Care | Source: Ambulatory Visit | Attending: Radiation Oncology | Admitting: Radiation Oncology

## 2024-06-18 ENCOUNTER — Other Ambulatory Visit: Payer: Self-pay

## 2024-06-18 DIAGNOSIS — R6889 Other general symptoms and signs: Secondary | ICD-10-CM | POA: Diagnosis not present

## 2024-06-18 DIAGNOSIS — Z7689 Persons encountering health services in other specified circumstances: Secondary | ICD-10-CM | POA: Diagnosis not present

## 2024-06-18 DIAGNOSIS — Z79631 Long term (current) use of antimetabolite agent: Secondary | ICD-10-CM | POA: Diagnosis not present

## 2024-06-18 DIAGNOSIS — Z87891 Personal history of nicotine dependence: Secondary | ICD-10-CM | POA: Diagnosis not present

## 2024-06-18 DIAGNOSIS — C7931 Secondary malignant neoplasm of brain: Secondary | ICD-10-CM | POA: Diagnosis not present

## 2024-06-18 DIAGNOSIS — Z79630 Long term (current) use of alkylating agent: Secondary | ICD-10-CM | POA: Diagnosis not present

## 2024-06-18 DIAGNOSIS — D709 Neutropenia, unspecified: Secondary | ICD-10-CM | POA: Diagnosis not present

## 2024-06-18 DIAGNOSIS — C3411 Malignant neoplasm of upper lobe, right bronchus or lung: Secondary | ICD-10-CM | POA: Diagnosis not present

## 2024-06-18 DIAGNOSIS — C7972 Secondary malignant neoplasm of left adrenal gland: Secondary | ICD-10-CM | POA: Diagnosis not present

## 2024-06-18 DIAGNOSIS — Z510 Encounter for antineoplastic radiation therapy: Secondary | ICD-10-CM | POA: Diagnosis not present

## 2024-06-18 DIAGNOSIS — Z79620 Long term (current) use of immunosuppressive biologic: Secondary | ICD-10-CM | POA: Diagnosis not present

## 2024-06-18 DIAGNOSIS — C7951 Secondary malignant neoplasm of bone: Secondary | ICD-10-CM | POA: Diagnosis not present

## 2024-06-18 DIAGNOSIS — Z5111 Encounter for antineoplastic chemotherapy: Secondary | ICD-10-CM | POA: Diagnosis not present

## 2024-06-18 LAB — RAD ONC ARIA SESSION SUMMARY
Course Elapsed Days: 3
Plan Fractions Treated to Date: 2
Plan Prescribed Dose Per Fraction: 9 Gy
Plan Total Fractions Prescribed: 3
Plan Total Prescribed Dose: 27 Gy
Reference Point Dosage Given to Date: 18 Gy
Reference Point Session Dosage Given: 9 Gy
Session Number: 2

## 2024-06-18 NOTE — Progress Notes (Signed)
 [name] over to nursing for 15 minutes observation for SRS/Brain treatment.  Mr. Overholser denies headache fatigue, ringing in the ears, visual changes, and nausea/vomiting.  Speech clear and gait independent without assistive devices.  No order for Decadron .  Advised not to do anything too strenuous for next 24 hours and to call [phone] if need.  Vitals:  96.8-100-18-114/72 O2 sat @ 98% RA.

## 2024-06-19 ENCOUNTER — Encounter: Admitting: Infectious Diseases

## 2024-06-19 ENCOUNTER — Ambulatory Visit (INDEPENDENT_AMBULATORY_CARE_PROVIDER_SITE_OTHER): Admitting: Infectious Diseases

## 2024-06-19 ENCOUNTER — Other Ambulatory Visit (HOSPITAL_COMMUNITY): Payer: Self-pay

## 2024-06-19 VITALS — BP 105/66 | HR 98 | Temp 97.9°F | Ht 73.0 in | Wt 185.0 lb

## 2024-06-19 DIAGNOSIS — B20 Human immunodeficiency virus [HIV] disease: Secondary | ICD-10-CM

## 2024-06-19 DIAGNOSIS — C3491 Malignant neoplasm of unspecified part of right bronchus or lung: Secondary | ICD-10-CM | POA: Diagnosis not present

## 2024-06-19 DIAGNOSIS — I10 Essential (primary) hypertension: Secondary | ICD-10-CM | POA: Diagnosis not present

## 2024-06-19 DIAGNOSIS — F32A Depression, unspecified: Secondary | ICD-10-CM | POA: Diagnosis not present

## 2024-06-19 DIAGNOSIS — B029 Zoster without complications: Secondary | ICD-10-CM

## 2024-06-19 DIAGNOSIS — F419 Anxiety disorder, unspecified: Secondary | ICD-10-CM | POA: Diagnosis not present

## 2024-06-19 DIAGNOSIS — R1319 Other dysphagia: Secondary | ICD-10-CM

## 2024-06-19 DIAGNOSIS — R6889 Other general symptoms and signs: Secondary | ICD-10-CM | POA: Diagnosis not present

## 2024-06-19 MED ORDER — VALACYCLOVIR HCL 1 G PO TABS
1000.0000 mg | ORAL_TABLET | Freq: Two times a day (BID) | ORAL | 1 refills | Status: AC
  Filled 2024-06-19: qty 14, 7d supply, fill #0

## 2024-06-19 MED ORDER — NYSTATIN 100000 UNIT/ML MT SUSP
5.0000 mL | Freq: Three times a day (TID) | OROMUCOSAL | 1 refills | Status: DC | PRN
  Filled 2024-06-19: qty 240, 14d supply, fill #0
  Filled 2024-07-12: qty 240, 14d supply, fill #1

## 2024-06-19 NOTE — Progress Notes (Signed)
 Subjective:    Patient ID: Benjamin Herrera, male  DOB: Jul 20, 1966, 58 y.o.        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 R shoulder pain (previous shoulder prosthesis).  He still has pain from this and has been told they will not remove. 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 Was taking TRV/ISN, had DRVr added at his visit 06-09-12. Then changed to tivicay  and complera. At his f/u 09-2015 was changed to genvoya /darunavir  due to need for ppi. Then changed to biktarvy .  He underwent stress test (positive) then cath and PTCA of OM1 (09-2016). 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 He was started on beta-blocker and crestor  at his CV f/u (01-21-17).   Was seen in ED 10-29-21 for accidental drug o/d after being found by bystanders. He thought this was due to cocaine.    He was seen for shoulder pain and found to have a pathologic lesion. He was then found to have stage 4 adenoCa (03-2024) of his R lung (brain, bone, mediastinal LN). He has started CTX, XRT. Had 3rd cycle 06-13-24. Had some previous dose reduction due to cytopenias. He has also resumed XRT.    At his last visit had feeling of something stuck in his throat, he was given magic mouthwash and improved. He had previous esophageal dilation done (? Date), last GI visit was 01-02-24, and further w/u has been deferred.   He continues to lose wt. (239# 2023). Has been taking ART (biktarvy ), without issue.  Today worried he has something on his backside he wants examined.   HIV 1 RNA Quant  Date Value  04/07/2021 27 Copies/mL (H)  07/30/2020 6,280 Copies/mL (H)  11/07/2019 366 copies/mL (H)   HIV-1 RNA Viral Load (copies/mL)  Date Value  05/09/2024 2,340   CD4 T Cell Abs (/uL)  Date Value  05/09/2024 <35 (L)  04/07/2021 318 (L)  07/30/2020 291 (L)     Health Maintenance  Topic Date Due  . DTaP/Tdap/Td (1 - Tdap) Never done  . Pneumococcal Vaccine: 50+ Years (3 of 3 - PCV)  10/03/2012  . COVID-19 Vaccine (3 - Pfizer risk series) 07/23/2020  . INFLUENZA VACCINE  05/25/2024  . Zoster Vaccines- Shingrix (1 of 2) 07/10/2024 (Originally 05/20/1985)  . Colonoscopy  01/31/2031  . Hepatitis B Vaccines 19-59 Average Risk  Completed  . Hepatitis C Screening  Completed  . HIV Screening  Completed  . HPV VACCINES  Aged Out  . Meningococcal B Vaccine  Aged Out  . Lung Cancer Screening  Discontinued      Review of Systems  Constitutional:  Positive for malaise/fatigue. Negative for chills, fever and weight loss (up 8# since end of july).  Respiratory:  Negative for cough and shortness of breath (DOE+).   Cardiovascular:  Negative for chest pain.  Gastrointestinal:  Negative for constipation and diarrhea.  Genitourinary:  Negative for dysuria.  Psychiatric/Behavioral:  Positive for depression. The patient has insomnia.     Please see HPI. All other systems reviewed and negative.     Objective:  Physical Exam Vitals reviewed.  Constitutional:      General: He is not in acute distress.    Appearance: He is normal weight. He is not ill-appearing, toxic-appearing or diaphoretic.  HENT:     Mouth/Throat:     Mouth: Mucous membranes are moist.     Pharynx: No oropharyngeal exudate.  Eyes:  Extraocular Movements: Extraocular movements intact.     Pupils: Pupils are equal, round, and reactive to light.  Cardiovascular:     Rate and Rhythm: Normal rate and regular rhythm.  Pulmonary:     Effort: Pulmonary effort is normal.     Breath sounds: Normal breath sounds.  Abdominal:     General: Bowel sounds are normal. There is no distension.     Palpations: Abdomen is soft.     Tenderness: There is no abdominal tenderness.  Musculoskeletal:     Cervical back: Normal range of motion.     Right lower leg: No edema.     Left lower leg: No edema.  Lymphadenopathy:     Cervical: Cervical adenopathy (at angle of jaw on L, firm) present.  Skin:        Comments:  Single ulcer on intergluteal fold. States this is pruritic and tender.   Neurological:     General: No focal deficit present.           Assessment & Plan:

## 2024-06-19 NOTE — Assessment & Plan Note (Signed)
 Refill his magic mouthwash.  No thrush visible on exam.

## 2024-06-19 NOTE — Assessment & Plan Note (Signed)
 Well Controlled  today

## 2024-06-19 NOTE — Assessment & Plan Note (Signed)
 Will treat him with 1 week of valtrex  (given his immune compromise).  Ddx is recurrence of his anal cancer, decubitus.  Will see him back in 1 month.

## 2024-06-19 NOTE — Assessment & Plan Note (Signed)
 Will recheck his CD4 and HIV RNA today See if he needs bactrim  prophylaxis.  No change in biktarvy  (pt states he is taking it) Will see him back in 1 month to see how his intragluteal fold lesion is healing.

## 2024-06-19 NOTE — Assessment & Plan Note (Signed)
 Appreciate heme/onc f/u.  Will f/u his CBC from today.

## 2024-06-19 NOTE — Assessment & Plan Note (Signed)
 Denies this today

## 2024-06-20 ENCOUNTER — Encounter: Payer: Self-pay | Admitting: Internal Medicine

## 2024-06-20 ENCOUNTER — Ambulatory Visit
Admission: RE | Admit: 2024-06-20 | Discharge: 2024-06-20 | Disposition: A | Discharge status: Home / Self Care | Source: Ambulatory Visit | Attending: Radiation Oncology | Admitting: Radiation Oncology

## 2024-06-20 ENCOUNTER — Other Ambulatory Visit (HOSPITAL_COMMUNITY): Payer: Self-pay

## 2024-06-20 ENCOUNTER — Inpatient Hospital Stay

## 2024-06-20 ENCOUNTER — Encounter: Payer: Self-pay | Admitting: Physician Assistant

## 2024-06-20 ENCOUNTER — Other Ambulatory Visit: Payer: Self-pay | Admitting: Infectious Diseases

## 2024-06-20 ENCOUNTER — Telehealth: Payer: Self-pay | Admitting: *Deleted

## 2024-06-20 ENCOUNTER — Other Ambulatory Visit: Payer: Self-pay

## 2024-06-20 DIAGNOSIS — Z510 Encounter for antineoplastic radiation therapy: Secondary | ICD-10-CM | POA: Diagnosis not present

## 2024-06-20 DIAGNOSIS — C7931 Secondary malignant neoplasm of brain: Secondary | ICD-10-CM | POA: Diagnosis not present

## 2024-06-20 DIAGNOSIS — Z79620 Long term (current) use of immunosuppressive biologic: Secondary | ICD-10-CM | POA: Diagnosis not present

## 2024-06-20 DIAGNOSIS — Z79630 Long term (current) use of alkylating agent: Secondary | ICD-10-CM | POA: Diagnosis not present

## 2024-06-20 DIAGNOSIS — B20 Human immunodeficiency virus [HIV] disease: Secondary | ICD-10-CM

## 2024-06-20 DIAGNOSIS — Z87891 Personal history of nicotine dependence: Secondary | ICD-10-CM | POA: Diagnosis not present

## 2024-06-20 DIAGNOSIS — D709 Neutropenia, unspecified: Secondary | ICD-10-CM | POA: Diagnosis not present

## 2024-06-20 DIAGNOSIS — C7951 Secondary malignant neoplasm of bone: Secondary | ICD-10-CM | POA: Diagnosis not present

## 2024-06-20 DIAGNOSIS — Z79631 Long term (current) use of antimetabolite agent: Secondary | ICD-10-CM | POA: Diagnosis not present

## 2024-06-20 DIAGNOSIS — R6889 Other general symptoms and signs: Secondary | ICD-10-CM | POA: Diagnosis not present

## 2024-06-20 DIAGNOSIS — Z5111 Encounter for antineoplastic chemotherapy: Secondary | ICD-10-CM | POA: Diagnosis not present

## 2024-06-20 DIAGNOSIS — C3411 Malignant neoplasm of upper lobe, right bronchus or lung: Secondary | ICD-10-CM | POA: Diagnosis not present

## 2024-06-20 DIAGNOSIS — Z7689 Persons encountering health services in other specified circumstances: Secondary | ICD-10-CM | POA: Diagnosis not present

## 2024-06-20 DIAGNOSIS — C7972 Secondary malignant neoplasm of left adrenal gland: Secondary | ICD-10-CM | POA: Diagnosis not present

## 2024-06-20 LAB — RAD ONC ARIA SESSION SUMMARY
Course Elapsed Days: 5
Plan Fractions Treated to Date: 3
Plan Prescribed Dose Per Fraction: 9 Gy
Plan Total Fractions Prescribed: 3
Plan Total Prescribed Dose: 27 Gy
Reference Point Dosage Given to Date: 27 Gy
Reference Point Session Dosage Given: 9 Gy
Session Number: 3

## 2024-06-20 LAB — T-HELPER CELLS (CD4) COUNT (NOT AT ARMC)
CD4 % Helper T Cell: 6 % — ABNORMAL LOW (ref 33–65)
CD4 T Cell Abs: <35 /uL — ABNORMAL LOW (ref 400–1790)

## 2024-06-20 MED ORDER — SULFAMETHOXAZOLE-TRIMETHOPRIM 800-160 MG PO TABS: 1.0000 | ORAL_TABLET | ORAL | 3 refills | Status: AC

## 2024-06-20 NOTE — Telephone Encounter (Signed)
 Call to patient informed that due to his CD4 Count and Chemotherapy Dr. Eben has ordered Bactrium DS for him to be taken 3 times a week and that a prescription has been sent to the [facility] on [address].  Patient voiced understanding and will start medication.

## 2024-06-20 NOTE — Progress Notes (Signed)
 Benjamin Herrera rested with us  for 15 minutes following his SRS treatment.  Patient denies headache, dizziness, nausea, diplopia or ringing in the ears. Denies fatigue. Patient without complaints. Understands to avoid strenuous activity for the next 24 hours and call [phone] with needs.   BP 120/70 (BP Location: Right Arm, Patient Position: Sitting, Cuff Size: Large)   Pulse (!) 101   Temp 97.9 F (36.6 C)   Resp 20   Ht 6' 1 (1.854 m)   SpO2 98%   BMI 24.41 kg/m

## 2024-06-20 NOTE — Telephone Encounter (Signed)
 Message to Dr. Eben about patient abnormal WBC results.

## 2024-06-21 ENCOUNTER — Encounter: Payer: Self-pay | Admitting: Internal Medicine

## 2024-06-21 DIAGNOSIS — R6889 Other general symptoms and signs: Secondary | ICD-10-CM | POA: Diagnosis not present

## 2024-06-21 LAB — CBC
Hematocrit: 30.1 % — ABNORMAL LOW (ref 37.5–51.0)
Hemoglobin: 9.5 g/dL — ABNORMAL LOW (ref 13.0–17.7)
MCH: 25.1 pg — ABNORMAL LOW (ref 26.6–33.0)
MCHC: 31.6 g/dL (ref 31.5–35.7)
MCV: 80 fL (ref 79–97)
Platelets: 275 x10E3/uL (ref 150–450)
RBC: 3.78 x10E6/uL — ABNORMAL LOW (ref 4.14–5.80)
RDW: 16.5 % — ABNORMAL HIGH (ref 11.6–15.4)
WBC: 1.3 x10E3/uL — CL (ref 3.4–10.8)

## 2024-06-21 LAB — HIV-1 RNA QUANT-NO REFLEX-BLD
HIV-1 RNA Viral Load Log: 3.666 {Log_copies}/mL
HIV-1 RNA Viral Load: 4630 {copies}/mL

## 2024-06-21 LAB — COMPREHENSIVE METABOLIC PANEL WITH GFR
ALT: 27 IU/L (ref 0–44)
AST: 28 IU/L (ref 0–40)
Albumin: 3.8 g/dL (ref 3.8–4.9)
Alkaline Phosphatase: 83 IU/L (ref 44–121)
BUN/Creatinine Ratio: 20 (ref 9–20)
BUN: 20 mg/dL (ref 6–24)
Bilirubin Total: 0.4 mg/dL (ref 0.0–1.2)
CO2: 21 mmol/L (ref 20–29)
Calcium: 8.9 mg/dL (ref 8.7–10.2)
Chloride: 101 mmol/L (ref 96–106)
Creatinine, Ser: 0.99 mg/dL (ref 0.76–1.27)
Globulin, Total: 3 g/dL (ref 1.5–4.5)
Glucose: 79 mg/dL (ref 70–99)
Potassium: 4.6 mmol/L (ref 3.5–5.2)
Sodium: 136 mmol/L (ref 134–144)
Total Protein: 6.8 g/dL (ref 6.0–8.5)
eGFR: 88 mL/min/1.73 (ref >59)

## 2024-06-21 NOTE — Radiation Completion Notes (Addendum)
  Radiation Oncology         ([phone] ________________________________  Name: Benjamin Herrera MRN: [id number]  Date of Service: 06/20/2024  DOB: Aug 19, 1966  End of Treatment Note  Diagnosis: Stage IVB, cT2bN3M1c, NSCLC, adenocarcinoma of the RUL with osseous metastatic disease   Intent: Curative     ==========DELIVERED PLANS==========  First Treatment Date: 2024-06-15 Last Treatment Date: 2024-06-20   Plan Name: Brain_SRT_dca Site: Brain [id number] Technique: SBRT/SRT-3D Mode: Photon Dose Per Fraction: 9 Gy Prescribed Dose (Delivered / Prescribed): 27 Gy / 27 Gy Prescribed Fxs (Delivered / Prescribed): 3 / 3     ==========ON TREATMENT VISIT DATES========== 2024-06-15, 2024-06-18, 2024-06-20    See weekly On Treatment Notes in Epic for details in the Media tab (listed as Progress notes on the On Treatment Visit Dates listed above). The patient tolerated radiation. He developed fatigue and anticipated skin changes in the treatment field.   The patient will receive a call in about one month from the radiation oncology department. He will continue follow up with Dr. Sherrod as well as in our brain oncology program.      Donald KYM Husband, [facility]

## 2024-06-21 NOTE — Radiation Completion Notes (Incomplete Revision)
  Radiation Oncology         (336) [phone] ________________________________  Name: Benjamin Herrera MRN: [id number]  Date of Service: 06/20/2024  DOB: 05-Jul-1966  End of Treatment Note  Diagnosis: Stage IVB, cT2bN3M1c, NSCLC, adenocarcinoma of the RUL with osseous metastatic disease   Intent: Curative     ==========DELIVERED PLANS==========  First Treatment Date: 2024-06-15 Last Treatment Date: 2024-06-20   Plan Name: Brain_SRT_dca Site: Brain  Technique: SBRT/SRT-3D Mode: Photon Dose Per Fraction: 9 Gy Prescribed Dose (Delivered / Prescribed): 27 Gy / 27 Gy Prescribed Fxs (Delivered / Prescribed): 3 / 3     ==========ON TREATMENT VISIT DATES========== 2024-06-15, 2024-06-18, 2024-06-20    See weekly On Treatment Notes in Epic for details in the Media tab (listed as Progress notes on the On Treatment Visit Dates listed above). The patient tolerated radiation. He developed fatigue and anticipated skin changes in the treatment field.   The patient will receive a call in about one month from the radiation oncology department. {He/she (caps):[phone]} will continue follow up with Dr. PIERRETTE as well.      Donald KYM Husband, PAC

## 2024-06-22 DIAGNOSIS — C3491 Malignant neoplasm of unspecified part of right bronchus or lung: Secondary | ICD-10-CM | POA: Diagnosis not present

## 2024-06-22 DIAGNOSIS — R6889 Other general symptoms and signs: Secondary | ICD-10-CM | POA: Diagnosis not present

## 2024-06-22 DIAGNOSIS — R1319 Other dysphagia: Secondary | ICD-10-CM | POA: Diagnosis not present

## 2024-06-22 DIAGNOSIS — R269 Unspecified abnormalities of gait and mobility: Secondary | ICD-10-CM | POA: Diagnosis not present

## 2024-06-25 DIAGNOSIS — R6889 Other general symptoms and signs: Secondary | ICD-10-CM | POA: Diagnosis not present

## 2024-06-26 ENCOUNTER — Other Ambulatory Visit (HOSPITAL_COMMUNITY): Payer: Self-pay

## 2024-06-26 DIAGNOSIS — R6889 Other general symptoms and signs: Secondary | ICD-10-CM | POA: Diagnosis not present

## 2024-06-27 ENCOUNTER — Inpatient Hospital Stay

## 2024-06-27 DIAGNOSIS — R6889 Other general symptoms and signs: Secondary | ICD-10-CM | POA: Diagnosis not present

## 2024-06-28 ENCOUNTER — Inpatient Hospital Stay: Attending: Physician Assistant

## 2024-06-28 ENCOUNTER — Ambulatory Visit (HOSPITAL_COMMUNITY)

## 2024-06-28 DIAGNOSIS — Z79899 Other long term (current) drug therapy: Secondary | ICD-10-CM | POA: Insufficient documentation

## 2024-06-28 DIAGNOSIS — Z79630 Long term (current) use of alkylating agent: Secondary | ICD-10-CM | POA: Insufficient documentation

## 2024-06-28 DIAGNOSIS — D649 Anemia, unspecified: Secondary | ICD-10-CM | POA: Insufficient documentation

## 2024-06-28 DIAGNOSIS — R6889 Other general symptoms and signs: Secondary | ICD-10-CM | POA: Diagnosis not present

## 2024-06-28 DIAGNOSIS — Z79631 Long term (current) use of antimetabolite agent: Secondary | ICD-10-CM | POA: Insufficient documentation

## 2024-06-28 DIAGNOSIS — C3411 Malignant neoplasm of upper lobe, right bronchus or lung: Secondary | ICD-10-CM | POA: Insufficient documentation

## 2024-06-28 DIAGNOSIS — Z5111 Encounter for antineoplastic chemotherapy: Secondary | ICD-10-CM | POA: Insufficient documentation

## 2024-06-28 DIAGNOSIS — Z5189 Encounter for other specified aftercare: Secondary | ICD-10-CM | POA: Insufficient documentation

## 2024-06-28 DIAGNOSIS — C7951 Secondary malignant neoplasm of bone: Secondary | ICD-10-CM | POA: Insufficient documentation

## 2024-06-29 ENCOUNTER — Telehealth: Payer: Self-pay | Admitting: Physician Assistant

## 2024-06-29 ENCOUNTER — Inpatient Hospital Stay

## 2024-06-29 ENCOUNTER — Ambulatory Visit (HOSPITAL_COMMUNITY)

## 2024-06-29 DIAGNOSIS — C3411 Malignant neoplasm of upper lobe, right bronchus or lung: Secondary | ICD-10-CM | POA: Diagnosis present

## 2024-06-29 DIAGNOSIS — Z5189 Encounter for other specified aftercare: Secondary | ICD-10-CM | POA: Diagnosis not present

## 2024-06-29 DIAGNOSIS — C7951 Secondary malignant neoplasm of bone: Secondary | ICD-10-CM | POA: Diagnosis present

## 2024-06-29 DIAGNOSIS — Z79899 Other long term (current) drug therapy: Secondary | ICD-10-CM | POA: Diagnosis not present

## 2024-06-29 DIAGNOSIS — D649 Anemia, unspecified: Secondary | ICD-10-CM | POA: Diagnosis not present

## 2024-06-29 DIAGNOSIS — Z79631 Long term (current) use of antimetabolite agent: Secondary | ICD-10-CM | POA: Diagnosis not present

## 2024-06-29 DIAGNOSIS — Z5111 Encounter for antineoplastic chemotherapy: Secondary | ICD-10-CM | POA: Diagnosis present

## 2024-06-29 DIAGNOSIS — C3491 Malignant neoplasm of unspecified part of right bronchus or lung: Secondary | ICD-10-CM

## 2024-06-29 DIAGNOSIS — Z79630 Long term (current) use of alkylating agent: Secondary | ICD-10-CM | POA: Diagnosis not present

## 2024-06-29 DIAGNOSIS — R6889 Other general symptoms and signs: Secondary | ICD-10-CM | POA: Diagnosis not present

## 2024-06-29 LAB — CMP (CANCER CENTER ONLY)
ALT: 14 U/L (ref 0–44)
AST: 24 U/L (ref 15–41)
Albumin: 3.9 g/dL (ref 3.5–5.0)
Alkaline Phosphatase: 77 U/L (ref 38–126)
Anion gap: 12 (ref 5–15)
BUN: 15 mg/dL (ref 6–20)
CO2: 24 mmol/L (ref 22–32)
Calcium: 9.2 mg/dL (ref 8.9–10.3)
Chloride: 102 mmol/L (ref 98–111)
Creatinine: 0.88 mg/dL (ref 0.61–1.24)
GFR, Estimated: >60 mL/min (ref >60)
Glucose, Bld: 77 mg/dL (ref 70–99)
Potassium: 3.5 mmol/L (ref 3.5–5.1)
Sodium: 137 mmol/L (ref 135–145)
Total Bilirubin: 0.3 mg/dL (ref 0.0–1.2)
Total Protein: 7.9 g/dL (ref 6.5–8.1)

## 2024-06-29 LAB — CBC WITH DIFFERENTIAL (CANCER CENTER ONLY)
Abs Immature Granulocytes: 0.01 K/uL (ref 0.00–0.07)
Basophils Absolute: 0 K/uL (ref 0.0–0.1)
Basophils Relative: 1 %
Eosinophils Absolute: 0 K/uL (ref 0.0–0.5)
Eosinophils Relative: 1 %
HCT: 28.4 % — ABNORMAL LOW (ref 39.0–52.0)
Hemoglobin: 9.2 g/dL — ABNORMAL LOW (ref 13.0–17.0)
Immature Granulocytes: 1 %
Lymphocytes Relative: 39 %
Lymphs Abs: 0.8 K/uL (ref 0.7–4.0)
MCH: 24.9 pg — ABNORMAL LOW (ref 26.0–34.0)
MCHC: 32.4 g/dL (ref 30.0–36.0)
MCV: 76.8 fL — ABNORMAL LOW (ref 80.0–100.0)
Monocytes Absolute: 0.4 K/uL (ref 0.1–1.0)
Monocytes Relative: 20 %
Neutro Abs: 0.8 K/uL — ABNORMAL LOW (ref 1.7–7.7)
Neutrophils Relative %: 38 %
Platelet Count: 109 K/uL — ABNORMAL LOW (ref 150–400)
RBC: 3.7 MIL/uL — ABNORMAL LOW (ref 4.22–5.81)
RDW: 17.7 % — ABNORMAL HIGH (ref 11.5–15.5)
WBC Count: 2.1 K/uL — ABNORMAL LOW (ref 4.0–10.5)
nRBC: 0 % (ref 0.0–0.2)

## 2024-06-29 NOTE — Telephone Encounter (Signed)
 I called the patient because I noticed his restaging CT scan was not scheduled.  The patient states that it was scheduled but the machine was broken so they had to cancel it and they are going to call him Monday to reschedule.  He also states he was not able to come in for his lab appointment yesterday and he is wondering if he can have this rescheduled for today around 4 PM.  We will reschedule his lab appointment.  I will see him next week as planned and he will talk to radiology on Monday about rescheduling his CT scan

## 2024-06-29 NOTE — Progress Notes (Signed)
 [facility] OFFICE PROGRESS NOTE  Benjamin Clam, MD [address] [place] [address] [place] [address]  DIAGNOSIS: Stage IV (T3, N3, M1 C) non-small cell lung cancer, adenocarcinoma presented with large right upper lobe lung mass in addition to right hilar and mediastinal as well as right supraclavicular lymphadenopathy and metastatic bone lesions including the left proximal humerus, L2 as well as sacral area and left adrenal metastasis diagnosed in June 2025.   Molecular studies show PDL1 of 0%   Biomarker Findings Blood Tumor Mutational Burden - 37 Muts/Mb ctDNA Tumor Fraction - High (2.4%) Microsatellite status - MSI-High Not Detected Genomic Findings For a complete list of the genes assayed, please refer to the Appendix. ARID1A E2250* KEAP1 G524C, G410fs*23 CTNNA1 splice site 2192+1G>C DNMT3A R882C TET2 K1197* TP53 splice site 813_993+[id number], deletion exons 8-9 This assay tested >300 cancer-related genes, including the following 8 gene(s) routinely assessed in this tumor type: ALK, BRAF, EGFR, ERBB2, KRAS, MET, RET, ROS1.  PRIOR THERAPY:  1) Palliative radiation to the painful bone lesion in the left humerus. Last day on 05/03/24.  2) SRS to the brain lesion, completed on on 06/20/24  CURRENT THERAPY: Systemic chemotherapy with Carboplatin  for an AUC of 5, Alimta  500 mg/m2, and Libatyo IV every 3 weeks. First dose on 05/02/24. Doses of Alimta  reduced to 400 mg/m2 starting from cycle #2 due to HIV and neutropenia.  Status post 3 cycles.   INTERVAL HISTORY: Benjamin Herrera 58 y.o. male returns to clinic today for follow-up visit accompanied by his sister.  The patient was last seen in the clinic 3 weeks ago by Dr. Sherrod.  The patient is status post 3 cycles of chemotherapy.  He is on reduced dose due to HIV and neutropenia. He did require GCSF with the first cycle of treatment.   He tolerated his last cycle of treatment well without any adverse side effects.    He also follows closely with nutrition.   He is currently on his fourth cycle of chemotherapy and immunotherapy, which includes all three medications for the last time. Future treatments will involve one chemotherapy and one immunotherapy medication. He has experienced minimal side effects, with nausea occurring only once during the previous cycle.  No fevers, chills, night sweats, or unintentional weight loss. Shortness of breath with exertion remains unchanged since the last visit. No unusual cough, hemoptysis, or chest pain. No headaches or vision changes.  He is also under the care of palliative care and nutrition specialists. His pain has decreased but is still present, now localized to his elbow and forearm, whereas previously it affected his entire arm. He saw an orthopedic doctor about a month ago regarding the metastatic bone lesion. He states that surgery is not an option.   He was supposed to have a restaging CT scan last week but the CT scanner was down and this has been rescheduled for 07/09/24. He is here today for evaluation to review his scan results before undergoing cycle #4.  MEDICAL HISTORY: Past Medical History:  Diagnosis Date   Achalasia and cardiospasm    Allergy    Anxiety    Arthritis    back ,shoulders    Asthma    Boils    under arms hx of   CAD (coronary artery disease)    a. 09/2016: cath showing 100% RCA occlusion with collaterals and 90% OM1 (treated with DES)   Candidiasis of mouth    Candidiasis of unspecified site    Cholelithiasis  Cocaine abuse (HCC)    Quit 09/2016   Condyloma acuminatum in male of scrotum & anal canal s/p laser ablation 07/03/2012   Coronary artery disease    Dysphagia    Fracture, humerus    Frozen shoulder    left   GERD (gastroesophageal reflux disease)    Heart attack (HCC)    Hemorrhoids    Hepatitis 1993   A   Herpes labialis    HIV (human immunodeficiency virus infection) (HCC) DX 1993   Hypertension Dx 2014    off all bp meds for last 9 or 10 months   Pilonidal disease 01/10/2012   Polysubstance abuse (HCC)    Psoriasis    hx of   Sleep apnea    does  have CPAP   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  MEDICATIONS:  Current Outpatient Medications  Medication Sig Dispense Refill   acetaminophen  (TYLENOL ) 325 MG tablet Take 650 mg by mouth every 6 (six) hours as needed for mild pain (pain score 1-3), moderate pain (pain score 4-6) or headache.     albuterol  (VENTOLIN  HFA) 108 (90 Base) MCG/ACT inhaler Inhale 2 puffs into the lungs every 6 (six) hours as needed for wheezing or shortness of breath. 1 each 2   amLODipine  (NORVASC ) 5 MG tablet Take 1 tablet (5 mg total) by mouth daily. 90 tablet 1   amoxicillin -clavulanate (AUGMENTIN ) 875-125 MG tablet Take 1 tablet by mouth 2 (two) times daily. 20 tablet 0   aspirin  81 MG chewable tablet Chew 1 tablet (81 mg total) by mouth daily. 90 tablet 3   Bictegravir-Emtricitab-Tenofov (BIKTARVY ) 30-120-15 MG TABS Take 1 tablet by mouth daily. 90 tablet 3   Blood Pressure KIT 1 kit by Does not apply route daily. 1 kit 0   diclofenac  Sodium (VOLTAREN  ARTHRITIS PAIN) 1 % GEL Apply 2 g topically 4 (four) times daily. 100 g 3   folic acid  (FOLVITE ) 1 MG tablet Take 1 tablet (1 mg total) by mouth daily. Start 7 days before pemetrexed  chemotherapy. Continue until 21 days after pemetrexed  completed. 100 tablet 3   hydrOXYzine  (ATARAX ) 25 MG tablet Take 1 tablet (25 mg total) by mouth 3 (three) times daily as needed. 30 tablet 0   lidocaine  (XYLOCAINE ) 2 % solution SWISH AND SWALLOW 5 MLS 3 (THREE) TIMES DAILY AS NEEDED FOR MOUTH PAIN. 360 mL 1   lidocaine -prilocaine  (EMLA ) cream Apply to affected area once 30 g 3   LORazepam  (ATIVAN ) 0.5 MG tablet 1 tab po 30 minutes prior to radiation or MRI scans 4 tablet  0   magic mouthwash (nystatin , diphenhydrAMINE , alum & mag hydroxide) suspension mixture Take 5 mLs by mouth 3 (three) times daily as needed for mouth pain. 240 mL 1   methocarbamol  (ROBAXIN ) 500 MG tablet Take 2 tablets (1,000 mg total) by mouth every 8 (eight) hours as needed. 90 tablet 1   metoprolol  succinate (TOPROL -XL) 25 MG 24 hr tablet TAKE 1/2 TABLET BY MOUTH DAILY 45 tablet 0   nitroGLYCERIN  (NITROSTAT ) 0.4 MG SL tablet Place 1 tablet (0.4 mg total) under the tongue every 5 (five) minutes as needed for chest pain (trouble swallowing). 25 tablet 1   nystatin  (MYCOSTATIN ) 100000 UNIT/ML suspension Take by mouth.     ondansetron  (ZOFRAN ) 8 MG tablet Take 1 tablet (8 mg total) by  mouth every 8 (eight) hours as needed for nausea or vomiting. Start on the third day after carboplatin . 30 tablet 1   oxyCODONE -acetaminophen  (PERCOCET/ROXICET) 5-325 MG tablet Take 1 tablet by mouth every 8 (eight) hours as needed for severe pain (pain score 7-10). 30 tablet 0   prochlorperazine  (COMPAZINE ) 10 MG tablet Take 1 tablet (10 mg total) by mouth every 6 (six) hours as needed for nausea or vomiting. 30 tablet 1   sulfamethoxazole -trimethoprim  (BACTRIM  DS) 800-160 MG tablet Take 1 tablet by mouth 3 (three) times a week. 36 tablet 3   valACYclovir  (VALTREX ) 1000 MG tablet Take 1 tablet (1,000 mg total) by mouth 2 (two) times daily for 14 days. 14 tablet 1   HYDROcodone -acetaminophen  (NORCO/VICODIN) 5-325 MG tablet Take 1 tablet by mouth every 6 (six) hours as needed for moderate pain (pain score 4-6).     No current facility-administered medications for this visit.    SURGICAL HISTORY:  Past Surgical History:  Procedure Laterality Date   BALLOON DILATION N/A 11/11/2015   Procedure: BALLOON DILATION;  Surgeon: Gustav Shila GAILS, MD;  Location: [facility];  Service: Endoscopy;  Laterality: N/A;   BOTOX  INJECTION  10/11/2012   Procedure: BOTOX  INJECTION;  Surgeon: Lamar JONETTA Aho, MD;  Location: [facility];  Service: Endoscopy;  Laterality: N/A;   BOTOX  INJECTION N/A 01/25/2013   Procedure: BOTOX  INJECTION;  Surgeon: Lamar JONETTA Aho, MD;  Location: [facility];  Service: Endoscopy;  Laterality: N/A;   BOTOX  INJECTION N/A 06/21/2013   Procedure: BOTOX  INJECTION;  Surgeon: Lamar JONETTA Aho, MD;  Location: [facility];  Service: Endoscopy;  Laterality: N/A;   BOTOX  INJECTION N/A 10/08/2013   Procedure: BOTOX  INJECTION;  Surgeon: Lamar JONETTA Aho, MD;  Location: [facility];  Service: Endoscopy;  Laterality: N/A;   BOTOX  INJECTION N/A 06/16/2015   Procedure: BOTOX  INJECTION;  Surgeon: Lamar JONETTA Aho, MD;  Location: [facility];  Service: Endoscopy;  Laterality: N/A;   BRONCHIAL BIOPSY  04/03/2024   Procedure: BRONCHOSCOPY, WITH BIOPSY;  Surgeon: Shelah Lamar RAMAN, MD;  Location: [facility] END[facility]COPY;  Service: Pulmonary;;   BRONCHIAL BRUSHINGS  04/03/2024   Procedure: BRONCHOSCOPY, WITH BRUSH BIOPSY;  Surgeon: Shelah Lamar RAMAN, MD;  Location: [facility];  Service: Pulmonary;;   BRONCHIAL NEEDLE ASPIRATION BIOPSY  04/03/2024   Procedure: BRONCHOSCOPY, WITH NEEDLE ASPIRATION BIOPSY;  Surgeon: Shelah Lamar RAMAN, MD;  Location: [facility];  Service: Pulmonary;;   BRONCHOSCOPY, WITH BIOPSY USING ELECTROMAGNETIC NAVIGATION Right 04/03/2024   Procedure: BRONCHOSCOPY, WITH BIOPSY USING ELECTROMAGNETIC NAVIGATION;  Surgeon: Shelah Lamar RAMAN, MD;  Location: [facility];  Service: Pulmonary;  Laterality: Right;   CARDIAC CATHETERIZATION N/A 10/11/2016   Procedure: Left Heart Cath and Coronary Angiography;  Surgeon: Lonni JONETTA Cash, MD;  mRCA 100% w/ L>R collaterals, OM1 90%   CARDIAC CATHETERIZATION N/A 10/11/2016   Procedure: Coronary Stent Intervention;  Surgeon: Lonni JONETTA Cash, MD;  RESOLUTE ONYX 3.5X15 DES OM1   COLONOSCOPY  05/19/2012   Procedure: COLONOSCOPY;  Surgeon: Lamar JONETTA Aho, MD;  Location: [facility];  Service: Endoscopy;  Laterality: N/A;  jill trying to contact pt to come in 0830  for 930 case, phone not accepting messages   COLONOSCOPY     ESOPHAGEAL MANOMETRY  05/29/2012   Procedure: ESOPHAGEAL MANOMETRY (EM);  Surgeon: Lamar JONETTA Aho, MD;  Location: [facility];  Service: Endoscopy;  Laterality: N/A;   ESOPHAGEAL MANOMETRY N/A 10/06/2015   Procedure: ESOPHAGEAL MANOMETRY (EM);  Surgeon: Gustav Shila GAILS, MD;  Location: [facility];  Service: Endoscopy;  Laterality: N/A;   ESOPHAGOGASTRODUODENOSCOPY  11/11/2011   Procedure: ESOPHAGOGASTRODUODENOSCOPY (EGD);  Surgeon: Lamar JONETTA Aho, MD;  Location: [facility];  Service: Endoscopy;  Laterality: N/A;  botox  injection  called Pt to change time of procedure per Dr Aho   ESOPHAGOGASTRODUODENOSCOPY  03/10/2012   Procedure: ESOPHAGOGASTRODUODENOSCOPY (EGD);  Surgeon: Lamar JONETTA Aho, MD;  Location: [facility];  Service: Endoscopy;  Laterality: N/A;   ESOPHAGOGASTRODUODENOSCOPY  10/11/2012   Procedure: ESOPHAGOGASTRODUODENOSCOPY (EGD);  Surgeon: Lamar JONETTA Aho, MD;  Location: [facility];  Service: Endoscopy;  Laterality: N/A;   ESOPHAGOGASTRODUODENOSCOPY N/A 01/25/2013   Procedure: ESOPHAGOGASTRODUODENOSCOPY (EGD);  Surgeon: Lamar JONETTA Aho, MD;  Location: [facility];  Service: Endoscopy;  Laterality: N/A;   ESOPHAGOGASTRODUODENOSCOPY N/A 06/21/2013   Procedure: ESOPHAGOGASTRODUODENOSCOPY (EGD);  Surgeon: Lamar JONETTA Aho, MD;  Location: [facility];  Service: Endoscopy;  Laterality: N/A;   ESOPHAGOGASTRODUODENOSCOPY (EGD) WITH PROPOFOL  N/A 10/08/2013   Procedure: ESOPHAGOGASTRODUODENOSCOPY (EGD) WITH PROPOFOL ;  Surgeon: Lamar JONETTA Aho, MD;  Location: [facility];  Service: Endoscopy;  Laterality: N/A;   ESOPHAGOGASTRODUODENOSCOPY (EGD) WITH PROPOFOL  N/A 06/16/2015   Procedure: ESOPHAGOGASTRODUODENOSCOPY (EGD) WITH PROPOFOL ;  Surgeon: Lamar JONETTA Aho, MD;  Location: [facility];  Service: Endoscopy;  Laterality: N/A;   ESOPHAGOGASTRODUODENOSCOPY (EGD) WITH PROPOFOL  N/A 11/11/2015   Procedure:  ESOPHAGOGASTRODUODENOSCOPY (EGD) WITH PROPOFOL ;  Surgeon: Gustav Shila GAILS, MD;  Location: [facility];  Service: Endoscopy;  Laterality: N/A;   EXAMINATION UNDER ANESTHESIA  07/24/2012   Procedure: EXAM UNDER ANESTHESIA;  Surgeon: Elspeth KYM Schultze, MD;  Location: [facility];  Service: General;  Laterality: N/A;   humeral fracture surgery Right yrs ago   PILONIDAL CYST EXCISION  07/24/2012   Procedure: CYST EXCISION PILONIDAL SIMPLE;  Surgeon: Elspeth KYM Schultze, MD;  Location: [facility];  Service: General;  Laterality: N/A;  Exam Under Anesthesia,, Excision Pilonidal Disease,    right shoulder replacement  2008   x 2   UPPER GASTROINTESTINAL ENDOSCOPY     WART FULGURATION  07/24/2012   Procedure: FULGURATION ANAL WART;  Surgeon: Elspeth KYM Schultze, MD;  Location: [facility];  Service: General;  Laterality: N/A;  excision of raphe mass    REVIEW OF SYSTEMS:   Constitutional: Positive for fatigue, appetite change, weight loss.  Negative for  chills and fever.  HENT: Negative for mouth sores, nosebleeds, and sore throat.  Eyes: Negative for eye problems and icterus.  Respiratory: Positive for intermittent dyspnea on exertion. Negative for cough, hemoptysis,  and wheezing.   Cardiovascular: Negative for chest pain and leg swelling.  Gastrointestinal:  Negative for abdominal pain,  diarrhea, constipation, nausea and vomiting.  Genitourinary: Negative for bladder incontinence, difficulty urinating, dysuria, frequency and hematuria.   Musculoskeletal: Positive for left arm pain and decreased ROM. Negative for  gait problem, neck pain and neck stiffness.  Skin: Negative for itching and rash  Neurological: Negative for dizziness, extremity weakness, gait problem, headaches, light-headedness and seizures.  Hematological: Negative for adenopathy. Does not bruise/bleed easily.  Psychiatric/Behavioral: Negative for confusion, depression and sleep disturbance. The patient is not nervous/anxious.      PHYSICAL  EXAMINATION:  Blood pressure (!) 152/82, pulse 95, temperature 97.6 F (36.4 C), temperature source Temporal, resp. rate 16, weight 188 lb 14.4 oz (85.7 kg), SpO2 95%.  ECOG PERFORMANCE STATUS: 1  Physical Exam  Constitutional: Oriented to person, place, and time and well-developed, well-nourished, and in no distress.  HENT:  Head: Normocephalic and atraumatic.  Mouth/Throat: Oropharynx is clear and moist. No oropharyngeal exudate.  Eyes: Conjunctivae are normal. Right eye exhibits no discharge. Left eye exhibits no discharge. No scleral icterus.  Neck: Normal range of motion. Neck supple.  Cardiovascular: Normal rate, regular rhythm, normal heart sounds and intact distal pulses.   Pulmonary/Chest: Effort normal and breath sounds normal. No respiratory distress. No wheezes. No rales.  Abdominal: Soft. Bowel sounds are normal. Exhibits no distension and no mass. There is no tenderness.  Musculoskeletal: Arm sling left arm. Decreased ROM of the left arm. Exhibits no edema.  Lymphadenopathy:    Left axillary subcutaneous nodules. Neurological: Alert and oriented to person, place, and time. Exhibits normal muscle tone. Gait normal. Coordination normal.  Skin: Skin is warm and dry. Positive for skin darkening in left shoulder and axilla.  Not diaphoretic. No erythema. No pallor.  Psychiatric: Mood, memory and judgment normal.  Vitals reviewed.   LABORATORY DATA: Lab Results  Component Value Date   WBC 2.9 (L) 07/04/2024   HGB 9.3 (L) 07/04/2024   HCT 28.7 (L) 07/04/2024   MCV 77.8 (L) 07/04/2024   PLT 204 07/04/2024      Chemistry      Component Value Date/Time   NA 141 07/04/2024 0834   NA 136 06/19/2024 1445   K 3.5 07/04/2024 0834   CL 106 07/04/2024 0834   CO2 29 07/04/2024 0834   BUN 9 07/04/2024 0834   BUN 20 06/19/2024 1445   CREATININE 0.84 07/04/2024 0834   CREATININE 0.96 07/30/2020 1437      Component Value Date/Time   CALCIUM  8.7 (L) 07/04/2024 0834   ALKPHOS  74 07/04/2024 0834   AST 19 07/04/2024 0834   ALT 13 07/04/2024 0834   BILITOT 0.2 07/04/2024 0834       RADIOGRAPHIC STUDIES:  No results found.   ASSESSMENT/PLAN:  This is a very pleasant 58 years old African-American male recently diagnosed with a stage IV (T3, N3, M1 C) non-small cell lung cancer, adenocarcinoma presented with large right upper lobe lung mass in addition to right hilar and mediastinal as well as right supraclavicular lymphadenopathy and metastatic bone lesions including the left proximal humerus, L2 as well as sacral area and left adrenal metastasis diagnosed in June 2025.   His molecular studies show no actionable mutations. He has high TMB. 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   The patient is currently on chemotherapy at a reduced dose with carboplatin  for an AUC of 4 and Alimta  400 mg/m, and Libtayo  IV every 3 weeks.  He is status post 3 cycles of treatment.  The patient was supposed have a restaging CT scan but had to be rescheduled due to the scanner being down.  This is rescheduled for 07/09/24.   Labs were reviewed.  His total WBC is 2.9 and his ANC is 1.3. The patient has HIV. I reviewed this with Dr. Sherrod about delaying treatment by 1 week vs. Proceeding with his dose reduced chemotherapy and immunotherapy with close monitoring of his labs. He recommending proceeding as planned today with close monitoring. I will make sure he has weekly labs scheduled. We will arrange for GCSF if his ANC on weekly labs is ~0.6 or less.   He will proceed with cycle #4 today as scheduled.  We will see him back for a follow-up visit in 3 weeks.   Will continue to follow with palliative care.  He will continue to follow with the surgical oncologist for his shoulder.  We will review his scan at his next appointment.   The patient  was advised to call immediately if he has any concerning symptoms in the interval. The patient voices understanding of current disease status and  treatment options and is in agreement with the current care plan. All questions were answered. The patient knows to call the clinic with any problems, questions or concerns. We can certainly see the patient much sooner if necessary      No orders of the defined types were placed in this encounter.   The total time spent in the appointment was 20-29 minutes  Jenney Brester L Venisha Boehning, PA-C 07/04/24

## 2024-07-02 ENCOUNTER — Other Ambulatory Visit: Payer: Self-pay

## 2024-07-02 ENCOUNTER — Other Ambulatory Visit: Payer: Self-pay | Admitting: Radiation Therapy

## 2024-07-02 DIAGNOSIS — G893 Neoplasm related pain (acute) (chronic): Secondary | ICD-10-CM

## 2024-07-02 DIAGNOSIS — R6889 Other general symptoms and signs: Secondary | ICD-10-CM | POA: Diagnosis not present

## 2024-07-02 DIAGNOSIS — C7931 Secondary malignant neoplasm of brain: Secondary | ICD-10-CM

## 2024-07-02 DIAGNOSIS — C3491 Malignant neoplasm of unspecified part of right bronchus or lung: Secondary | ICD-10-CM

## 2024-07-02 MED ORDER — OXYCODONE-ACETAMINOPHEN 5-325 MG PO TABS
1.0000 | ORAL_TABLET | Freq: Three times a day (TID) | ORAL | 0 refills | Status: DC | PRN
Start: 2024-07-02 — End: 2024-07-25

## 2024-07-03 ENCOUNTER — Other Ambulatory Visit: Payer: Self-pay | Admitting: Physician Assistant

## 2024-07-03 ENCOUNTER — Other Ambulatory Visit: Payer: Self-pay

## 2024-07-03 DIAGNOSIS — G893 Neoplasm related pain (acute) (chronic): Secondary | ICD-10-CM

## 2024-07-03 DIAGNOSIS — C3491 Malignant neoplasm of unspecified part of right bronchus or lung: Secondary | ICD-10-CM

## 2024-07-03 DIAGNOSIS — R6889 Other general symptoms and signs: Secondary | ICD-10-CM | POA: Diagnosis not present

## 2024-07-03 DIAGNOSIS — Z515 Encounter for palliative care: Secondary | ICD-10-CM

## 2024-07-03 MED FILL — Fosaprepitant Dimeglumine For IV Infusion 150 MG (Base Eq): INTRAVENOUS | Qty: 5 | Status: AC

## 2024-07-03 NOTE — Progress Notes (Signed)
 Orders per NP

## 2024-07-04 ENCOUNTER — Ambulatory Visit

## 2024-07-04 ENCOUNTER — Inpatient Hospital Stay

## 2024-07-04 ENCOUNTER — Encounter: Payer: Self-pay | Admitting: Nurse Practitioner

## 2024-07-04 ENCOUNTER — Inpatient Hospital Stay (HOSPITAL_BASED_OUTPATIENT_CLINIC_OR_DEPARTMENT_OTHER): Admitting: Physician Assistant

## 2024-07-04 ENCOUNTER — Inpatient Hospital Stay (HOSPITAL_BASED_OUTPATIENT_CLINIC_OR_DEPARTMENT_OTHER): Admitting: Nurse Practitioner

## 2024-07-04 VITALS — BP 152/82 | HR 95 | Temp 97.6°F | Resp 16 | Wt 188.9 lb

## 2024-07-04 VITALS — BP 132/64

## 2024-07-04 DIAGNOSIS — G4709 Other insomnia: Secondary | ICD-10-CM | POA: Diagnosis not present

## 2024-07-04 DIAGNOSIS — K5903 Drug induced constipation: Secondary | ICD-10-CM

## 2024-07-04 DIAGNOSIS — Z5111 Encounter for antineoplastic chemotherapy: Secondary | ICD-10-CM | POA: Diagnosis not present

## 2024-07-04 DIAGNOSIS — C3491 Malignant neoplasm of unspecified part of right bronchus or lung: Secondary | ICD-10-CM

## 2024-07-04 DIAGNOSIS — Z515 Encounter for palliative care: Secondary | ICD-10-CM

## 2024-07-04 DIAGNOSIS — R6889 Other general symptoms and signs: Secondary | ICD-10-CM | POA: Diagnosis not present

## 2024-07-04 DIAGNOSIS — G893 Neoplasm related pain (acute) (chronic): Secondary | ICD-10-CM | POA: Diagnosis not present

## 2024-07-04 DIAGNOSIS — Z5112 Encounter for antineoplastic immunotherapy: Secondary | ICD-10-CM

## 2024-07-04 LAB — CMP (CANCER CENTER ONLY)
ALT: 13 U/L (ref 0–44)
AST: 19 U/L (ref 15–41)
Albumin: 3.9 g/dL (ref 3.5–5.0)
Alkaline Phosphatase: 74 U/L (ref 38–126)
Anion gap: 6 (ref 5–15)
BUN: 9 mg/dL (ref 6–20)
CO2: 29 mmol/L (ref 22–32)
Calcium: 8.7 mg/dL — ABNORMAL LOW (ref 8.9–10.3)
Chloride: 106 mmol/L (ref 98–111)
Creatinine: 0.84 mg/dL (ref 0.61–1.24)
GFR, Estimated: >60 mL/min (ref >60)
Glucose, Bld: 94 mg/dL (ref 70–99)
Potassium: 3.5 mmol/L (ref 3.5–5.1)
Sodium: 141 mmol/L (ref 135–145)
Total Bilirubin: 0.2 mg/dL (ref 0.0–1.2)
Total Protein: 7.9 g/dL (ref 6.5–8.1)

## 2024-07-04 LAB — CBC WITH DIFFERENTIAL (CANCER CENTER ONLY)
Abs Immature Granulocytes: 0.05 K/uL (ref 0.00–0.07)
Basophils Absolute: 0 K/uL (ref 0.0–0.1)
Basophils Relative: 1 %
Eosinophils Absolute: 0.1 K/uL (ref 0.0–0.5)
Eosinophils Relative: 3 %
HCT: 28.7 % — ABNORMAL LOW (ref 39.0–52.0)
Hemoglobin: 9.3 g/dL — ABNORMAL LOW (ref 13.0–17.0)
Immature Granulocytes: 2 %
Lymphocytes Relative: 31 %
Lymphs Abs: 0.9 K/uL (ref 0.7–4.0)
MCH: 25.2 pg — ABNORMAL LOW (ref 26.0–34.0)
MCHC: 32.4 g/dL (ref 30.0–36.0)
MCV: 77.8 fL — ABNORMAL LOW (ref 80.0–100.0)
Monocytes Absolute: 0.5 K/uL (ref 0.1–1.0)
Monocytes Relative: 18 %
Neutro Abs: 1.3 K/uL — ABNORMAL LOW (ref 1.7–7.7)
Neutrophils Relative %: 45 %
Platelet Count: 204 K/uL (ref 150–400)
RBC: 3.69 MIL/uL — ABNORMAL LOW (ref 4.22–5.81)
RDW: 19.6 % — ABNORMAL HIGH (ref 11.5–15.5)
WBC Count: 2.9 K/uL — ABNORMAL LOW (ref 4.0–10.5)
nRBC: 0 % (ref 0.0–0.2)

## 2024-07-04 LAB — SAMPLE TO BLOOD BANK

## 2024-07-04 MED ORDER — SODIUM CHLORIDE 0.9 % IV SOLN
150.0000 mg | Freq: Once | INTRAVENOUS | Status: AC
  Administered 2024-07-04: 150 mg via INTRAVENOUS
  Filled 2024-07-04: qty 150

## 2024-07-04 MED ORDER — SODIUM CHLORIDE 0.9 % IV SOLN
350.0000 mg | Freq: Once | INTRAVENOUS | Status: AC
  Administered 2024-07-04: 350 mg via INTRAVENOUS
  Filled 2024-07-04: qty 7

## 2024-07-04 MED ORDER — SODIUM CHLORIDE 0.9 % IV SOLN: INTRAVENOUS | Status: DC

## 2024-07-04 MED ORDER — DEXAMETHASONE SODIUM PHOSPHATE 10 MG/ML IJ SOLN
10.0000 mg | Freq: Once | INTRAMUSCULAR | Status: AC
  Administered 2024-07-04: 10 mg via INTRAVENOUS
  Filled 2024-07-04: qty 1

## 2024-07-04 MED ORDER — SODIUM CHLORIDE 0.9 % IV SOLN
551.6000 mg | Freq: Once | INTRAVENOUS | Status: AC
  Administered 2024-07-04: 550 mg via INTRAVENOUS
  Filled 2024-07-04: qty 55

## 2024-07-04 MED ORDER — PALONOSETRON HCL INJECTION 0.25 MG/5ML
0.2500 mg | Freq: Once | INTRAVENOUS | Status: AC
  Administered 2024-07-04: 0.25 mg via INTRAVENOUS
  Filled 2024-07-04: qty 5

## 2024-07-04 MED ORDER — SODIUM CHLORIDE 0.9 % IV SOLN
400.0000 mg/m2 | Freq: Once | INTRAVENOUS | Status: AC
  Administered 2024-07-04: 800 mg via INTRAVENOUS
  Filled 2024-07-04: qty 20

## 2024-07-04 NOTE — Progress Notes (Signed)
 Palliative Medicine [facility]  [phone] [phone]   Name: Benjamin Herrera Date: 07/04/2024 MRN: [id number]  DOB: 1966/09/21  Patient Care Team: [name], MD as PCP - General (Family Medicine) Eben Reyes [name], MD as PCP - Infectious Diseases (Infectious Diseases) Mona Vinie [name], MD as PCP - Cardiology (Cardiology) Debrah Lamar [name], MD (Inactive) as Consulting Physician (Gastroenterology) [name], MD as Consulting Physician (General Surgery) [name], RN as Oncology Nurse Navigator    INTERVAL HISTORY: Benjamin Herrera is a 58 y.o. male with medical problems include: HIV, hypertension, coronary artery disease (hx MI, status post DES to OM1 in 2017), chronic low back pain, SCC of skin, OSA, polysubstance abuse, GERD, asthma, anxiety, arthritis, more recently he was diagnosed with Adenocarcinoma of right lung, stage 4.   SOCIAL HISTORY:     reports that he has quit smoking. His smoking use included cigarettes. He started smoking about 42 years ago. He has a 42.7 pack-year smoking history. He has never used smokeless tobacco. He reports that he does not currently use drugs after having used the following drugs: Marijuana. Frequency: 3.00 times per week. He reports that he does not drink alcohol.  ADVANCE DIRECTIVES:  Full code full scope. Plans to meet with social work to establish advance directives. Referral to social work for this placed at initial visit. Pending completion of forms.   CODE STATUS: Full code  PAST MEDICAL HISTORY: Past Medical History:  Diagnosis Date   Achalasia and cardiospasm    Allergy    Anxiety    Arthritis    back ,shoulders    Asthma    Boils    under arms hx of   CAD (coronary artery disease)    a. 09/2016: cath showing 100% RCA occlusion with collaterals and 90% OM1 (treated with DES)   Candidiasis of mouth    Candidiasis of unspecified site    Cholelithiasis    Cocaine abuse  (HCC)    Quit 09/2016   Condyloma acuminatum in male of scrotum & anal canal s/p laser ablation 07/03/2012   Coronary artery disease    Dysphagia    Fracture, humerus    Frozen shoulder    left   GERD (gastroesophageal reflux disease)    Heart attack (HCC)    Hemorrhoids    Hepatitis 1993   A   Herpes labialis    HIV (human immunodeficiency virus infection) (HCC) DX 1993   Hypertension Dx 2014   off all bp meds for last 9 or 10 months   Pilonidal disease 01/10/2012   Polysubstance abuse (HCC)    Psoriasis    hx of   Sleep apnea    does  have CPAP   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  MEDICATIONS:  Current Outpatient Medications  Medication Sig Dispense Refill   acetaminophen  (TYLENOL ) 325 MG tablet Take 650 mg by mouth every 6 (six) hours as needed for mild pain (pain score 1-3), moderate pain (pain score 4-6) or headache.     albuterol  (VENTOLIN  HFA) 108 (90 Base) MCG/ACT inhaler Inhale 2 puffs into the lungs every 6 (six) hours as needed for wheezing or shortness of breath. 1 each 2   amLODipine  (NORVASC ) 5 MG tablet Take 1 tablet (5 mg total) by mouth daily. 90  tablet 1   amoxicillin -clavulanate (AUGMENTIN ) 875-125 MG tablet Take 1 tablet by mouth 2 (two) times daily. 20 tablet 0   aspirin  81 MG chewable tablet Chew 1 tablet (81 mg total) by mouth daily. 90 tablet 3   Bictegravir-Emtricitab-Tenofov (BIKTARVY ) 30-120-15 MG TABS Take 1 tablet by mouth daily. 90 tablet 3   Blood Pressure KIT 1 kit by Does not apply route daily. 1 kit 0   diclofenac  Sodium (VOLTAREN  ARTHRITIS PAIN) 1 % GEL Apply 2 g topically 4 (four) times daily. 100 g 3   folic acid  (FOLVITE ) 1 MG tablet Take 1 tablet (1 mg total) by mouth daily. Start 7 days before pemetrexed  chemotherapy. Continue until 21 days after pemetrexed  completed. 100  tablet 3   HYDROcodone -acetaminophen  (NORCO/VICODIN) 5-325 MG tablet Take 1 tablet by mouth every 6 (six) hours as needed for moderate pain (pain score 4-6).     hydrOXYzine  (ATARAX ) 25 MG tablet Take 1 tablet (25 mg total) by mouth 3 (three) times daily as needed. 30 tablet 0   lidocaine  (XYLOCAINE ) 2 % solution SWISH AND SWALLOW 5 MLS 3 (THREE) TIMES DAILY AS NEEDED FOR MOUTH PAIN. 360 mL 1   lidocaine -prilocaine  (EMLA ) cream Apply to affected area once 30 g 3   LORazepam  (ATIVAN ) 0.5 MG tablet 1 tab po 30 minutes prior to radiation or MRI scans 4 tablet 0   magic mouthwash (nystatin , diphenhydrAMINE , alum & mag hydroxide) suspension mixture Take 5 mLs by mouth 3 (three) times daily as needed for mouth pain. 240 mL 1   methocarbamol  (ROBAXIN ) 500 MG tablet Take 2 tablets (1,000 mg total) by mouth every 8 (eight) hours as needed. 90 tablet 1   metoprolol  succinate (TOPROL -XL) 25 MG 24 hr tablet TAKE 1/2 TABLET BY MOUTH DAILY 45 tablet 0   nitroGLYCERIN  (NITROSTAT ) 0.4 MG SL tablet Place 1 tablet (0.4 mg total) under the tongue every 5 (five) minutes as needed for chest pain (trouble swallowing). 25 tablet 1   nystatin  (MYCOSTATIN ) 100000 UNIT/ML suspension Take by mouth.     ondansetron  (ZOFRAN ) 8 MG tablet Take 1 tablet (8 mg total) by mouth every 8 (eight) hours as needed for nausea or vomiting. Start on the third day after carboplatin . 30 tablet 1   oxyCODONE -acetaminophen  (PERCOCET/ROXICET) 5-325 MG tablet Take 1 tablet by mouth every 8 (eight) hours as needed for severe pain (pain score 7-10). 30 tablet 0   prochlorperazine  (COMPAZINE ) 10 MG tablet Take 1 tablet (10 mg total) by mouth every 6 (six) hours as needed for nausea or vomiting. 30 tablet 1   sulfamethoxazole -trimethoprim  (BACTRIM  DS) 800-160 MG tablet Take 1 tablet by mouth 3 (three) times a week. 36 tablet 3   valACYclovir  (VALTREX ) 1000 MG tablet Take 1 tablet (1,000 mg total) by mouth 2 (two) times daily for 14 days. 14 tablet 1    No current facility-administered medications for this visit.   Facility-Administered Medications Ordered in Other Visits  Medication Dose Route Frequency Provider Last Rate Last Admin   0.9 %  sodium chloride  infusion   Intravenous Continuous Sherrod Sherrod, MD 10 mL/hr at 07/04/24 1020 New Bag at 07/04/24 1020   cemiplimab -rwlc (LIBTAYO ) 350 mg in sodium chloride  0.9 % 100 mL chemo infusion  350 mg Intravenous Once Sherrod Sherrod, MD 214 mL/hr at 07/04/24 1309 350 mg at 07/04/24 1309    VITAL SIGNS: BP 132/64 (BP Location: Right Arm, Patient Position: Sitting)  There were no vitals filed for this visit.  Estimated body mass index  is 24.92 kg/m as calculated from the following:   Height as of 06/20/24: 6' 1 (1.854 m).   Weight as of an earlier encounter on 07/04/24: 188 lb 14.4 oz (85.7 kg).  PERFORMANCE STATUS (ECOG) : 1 - Symptomatic but completely ambulatory  Physical Exam General: NAD Cardiovascular: regular rate and rhythm Pulmonary: normal breathing pattern Extremities: no edema, no joint deformities Skin: no rashes Neurological: AAO x3  IMPRESSION: Discussed the use of AI scribe software for clinical note transcription with the patient, who gave verbal consent to proceed.  History of Present Illness LEONELL LOBDELL is a 58 year old male who presented to clinic for symptom management follow-up.  No acute distress.  No family present.  His energy levels fluctuate, with some days being better than others. He is able to walk around his house and engage in activities when not in pain. His sleep pattern is irregular, often dictated by when he falls asleep watching TV. He takes naps during the day and does not have a set bedtime.  He experiences constipation. He is currently taking a stool softener (senna) every other day, resulting in bowel movements approximately every 2-3 days. His appetite has improved, and he has no nausea or vomiting.  His current weight is up to 188  pounds from 185 pounds on 8/26.  Encouraged patient to increase senna to 1 tablet every day and if no improvement in bowel pattern to increase to 2 tablets daily.  He verbalized understanding.  He experiences persistent pain, which he reports is not as bad as it was, but still causes problems. He takes pain medication as needed, sometimes one or two tablets depending on the severity of the pain. He notes that other medications cause drowsiness, which he attributes to his low thyroid  condition.  We will continue to closely follow and support. No adjustments to regimen.    Assessment & Plan Cancer related pain Chronic pain persists, though less severe. Pain management is crucial for maintaining functionality. Current regimen includes pain medication as needed, with dosage variability based on pain severity. Non-opioid options like acetaminophen  are limited due to side effects such as drowsiness, possibly exacerbated by hypothyroidism. Goal is to improve pain control to maintain functionality.  Declines need for refill on today. - Continue current pain medication (Percocet) regimen as needed based on pain severity - Pending UDS. -Continue to monitor pain levels and adjust regimen as indicated  Constipation and diarrhea Alternating constipation and diarrhea. Currently taking a stool softener every other day, resulting in bowel movements approximately daily. Goal is to prevent more than two days without a bowel movement. - Increase stool softener to one pill daily - If bowel movements become regular, adjust to one pill every other day - Monitor bowel movement frequency and adjust stool softener dosage as needed  Sleep disturbance Sleep is irregular with no consistent pattern, often influenced by external factors like television. Daytime naps are common. Sleep disturbances may be related to pain and medication side effects.  I will plan to see patient back in 2-3 weeks. Sooner if needed.   Patient  expressed understanding and was in agreement with this plan. He also understands that He can call the clinic at any time with any questions, concerns, or complaints.   Any controlled substances utilized were prescribed in the context of palliative care. PDMP has been reviewed.   Visit consisted of counseling and education dealing with the complex and emotionally intense issues of symptom management and palliative care in  the setting of serious and potentially life-threatening illness.  Levon Borer, AGPCNP-BC  Palliative Medicine Team/[facility]

## 2024-07-04 NOTE — Patient Instructions (Signed)
 [facility] - A DEPT OF [facility]  Discharge Instructions: Thank you for choosing [facility] to provide your oncology and hematology care.   If you have a lab appointment with the Cancer Center, please go directly to the Cancer Center and check in at the registration area.   Wear comfortable clothing and clothing appropriate for easy access to any Portacath or PICC line.   We strive to give you quality time with your provider. You may need to reschedule your appointment if you arrive late (15 or more minutes).  Arriving late affects you and other patients whose appointments are after yours.  Also, if you miss three or more appointments without notifying the office, you may be dismissed from the clinic at the provider's discretion.      For prescription refill requests, have your pharmacy contact our office and allow 72 hours for refills to be completed.    Today you received the following chemotherapy and/or immunotherapy agents :  Cemiplimab , pemetrexed , & Carboplatin       To help prevent nausea and vomiting after your treatment, we encourage you to take your nausea medication as directed.  BELOW ARE SYMPTOMS THAT SHOULD BE REPORTED IMMEDIATELY: *FEVER GREATER THAN 100.4 F (38 C) OR HIGHER *CHILLS OR SWEATING *NAUSEA AND VOMITING THAT IS NOT CONTROLLED WITH YOUR NAUSEA MEDICATION *UNUSUAL SHORTNESS OF BREATH *UNUSUAL BRUISING OR BLEEDING *URINARY PROBLEMS (pain or burning when urinating, or frequent urination) *BOWEL PROBLEMS (unusual diarrhea, constipation, pain near the anus) TENDERNESS IN MOUTH AND THROAT WITH OR WITHOUT PRESENCE OF ULCERS (sore throat, sores in mouth, or a toothache) UNUSUAL RASH, SWELLING OR PAIN  UNUSUAL VAGINAL DISCHARGE OR ITCHING   Items with * indicate a potential emergency and should be followed up as soon as possible or go to the Emergency Department if any problems should occur.  Please show the  CHEMOTHERAPY ALERT CARD or IMMUNOTHERAPY ALERT CARD at check-in to the Emergency Department and triage nurse.  Should you have questions after your visit or need to cancel or reschedule your appointment, please contact [facility] - A DEPT OF [facility]  Dept: [phone]  and follow the prompts.  Office hours are 8:00 a.m. to 4:30 p.m. Monday - Friday. Please note that voicemails left after 4:00 p.m. may not be returned until the following business day.  We are closed weekends and major holidays. You have access to a nurse at all times for urgent questions. Please call the main number to the clinic Dept: [phone] and follow the prompts.   For any non-urgent questions, you may also contact your provider using MyChart. We now offer e-Visits for anyone 73 and older to request care online for non-urgent symptoms. For details visit [email or web address].   Also download the MyChart app! Go to the app store, search MyChart, open the app, select [facility], and log in with your MyChart username and password.

## 2024-07-05 DIAGNOSIS — R6889 Other general symptoms and signs: Secondary | ICD-10-CM | POA: Diagnosis not present

## 2024-07-06 DIAGNOSIS — R6889 Other general symptoms and signs: Secondary | ICD-10-CM | POA: Diagnosis not present

## 2024-07-08 DIAGNOSIS — C3490 Malignant neoplasm of unspecified part of unspecified bronchus or lung: Secondary | ICD-10-CM | POA: Diagnosis not present

## 2024-07-08 DIAGNOSIS — N3941 Urge incontinence: Secondary | ICD-10-CM | POA: Diagnosis not present

## 2024-07-09 ENCOUNTER — Ambulatory Visit (HOSPITAL_COMMUNITY)
Admission: RE | Admit: 2024-07-09 | Discharge: 2024-07-09 | Disposition: A | Discharge status: Home / Self Care | Source: Ambulatory Visit | Attending: Internal Medicine | Admitting: Internal Medicine

## 2024-07-09 DIAGNOSIS — C3492 Malignant neoplasm of unspecified part of left bronchus or lung: Secondary | ICD-10-CM | POA: Diagnosis not present

## 2024-07-09 DIAGNOSIS — C3490 Malignant neoplasm of unspecified part of unspecified bronchus or lung: Secondary | ICD-10-CM | POA: Diagnosis not present

## 2024-07-09 DIAGNOSIS — I700 Atherosclerosis of aorta: Secondary | ICD-10-CM | POA: Diagnosis not present

## 2024-07-09 DIAGNOSIS — C7951 Secondary malignant neoplasm of bone: Secondary | ICD-10-CM | POA: Diagnosis not present

## 2024-07-09 DIAGNOSIS — J439 Emphysema, unspecified: Secondary | ICD-10-CM | POA: Diagnosis not present

## 2024-07-09 DIAGNOSIS — R6889 Other general symptoms and signs: Secondary | ICD-10-CM | POA: Diagnosis not present

## 2024-07-09 MED ORDER — IOHEXOL 300 MG/ML  SOLN
100.0000 mL | Freq: Once | INTRAMUSCULAR | Status: AC | PRN
Start: 2024-07-09 — End: 2024-07-09
  Administered 2024-07-09: 100 mL via INTRAVENOUS

## 2024-07-10 DIAGNOSIS — R6889 Other general symptoms and signs: Secondary | ICD-10-CM | POA: Diagnosis not present

## 2024-07-11 ENCOUNTER — Inpatient Hospital Stay

## 2024-07-11 DIAGNOSIS — R6889 Other general symptoms and signs: Secondary | ICD-10-CM | POA: Diagnosis not present

## 2024-07-12 ENCOUNTER — Encounter: Payer: Self-pay | Admitting: Physician Assistant

## 2024-07-12 ENCOUNTER — Other Ambulatory Visit (HOSPITAL_COMMUNITY): Payer: Self-pay

## 2024-07-12 ENCOUNTER — Encounter: Payer: Self-pay | Admitting: Internal Medicine

## 2024-07-12 DIAGNOSIS — R6889 Other general symptoms and signs: Secondary | ICD-10-CM | POA: Diagnosis not present

## 2024-07-13 DIAGNOSIS — R6889 Other general symptoms and signs: Secondary | ICD-10-CM | POA: Diagnosis not present

## 2024-07-16 DIAGNOSIS — R6889 Other general symptoms and signs: Secondary | ICD-10-CM | POA: Diagnosis not present

## 2024-07-17 DIAGNOSIS — R6889 Other general symptoms and signs: Secondary | ICD-10-CM | POA: Diagnosis not present

## 2024-07-18 ENCOUNTER — Encounter: Payer: Self-pay | Admitting: Internal Medicine

## 2024-07-18 ENCOUNTER — Other Ambulatory Visit: Payer: Self-pay | Admitting: Physician Assistant

## 2024-07-18 ENCOUNTER — Inpatient Hospital Stay

## 2024-07-18 DIAGNOSIS — D649 Anemia, unspecified: Secondary | ICD-10-CM

## 2024-07-18 DIAGNOSIS — C3491 Malignant neoplasm of unspecified part of right bronchus or lung: Secondary | ICD-10-CM

## 2024-07-18 DIAGNOSIS — Z5111 Encounter for antineoplastic chemotherapy: Secondary | ICD-10-CM | POA: Diagnosis not present

## 2024-07-18 DIAGNOSIS — R6889 Other general symptoms and signs: Secondary | ICD-10-CM | POA: Diagnosis not present

## 2024-07-18 LAB — CMP (CANCER CENTER ONLY)
ALT: 8 U/L (ref 0–44)
AST: 15 U/L (ref 15–41)
Albumin: 3.7 g/dL (ref 3.5–5.0)
Alkaline Phosphatase: 62 U/L (ref 38–126)
Anion gap: 5 (ref 5–15)
BUN: 15 mg/dL (ref 6–20)
CO2: 28 mmol/L (ref 22–32)
Calcium: 9.1 mg/dL (ref 8.9–10.3)
Chloride: 107 mmol/L (ref 98–111)
Creatinine: 1.02 mg/dL (ref 0.61–1.24)
GFR, Estimated: >60 mL/min (ref >60)
Glucose, Bld: 84 mg/dL (ref 70–99)
Potassium: 3.8 mmol/L (ref 3.5–5.1)
Sodium: 140 mmol/L (ref 135–145)
Total Bilirubin: 0.2 mg/dL (ref 0.0–1.2)
Total Protein: 7.5 g/dL (ref 6.5–8.1)

## 2024-07-18 LAB — CBC WITH DIFFERENTIAL (CANCER CENTER ONLY)
Abs Immature Granulocytes: 0 K/uL (ref 0.00–0.07)
Basophils Absolute: 0 K/uL (ref 0.0–0.1)
Basophils Relative: 1 %
Eosinophils Absolute: 0 K/uL (ref 0.0–0.5)
Eosinophils Relative: 2 %
HCT: 23 % — ABNORMAL LOW (ref 39.0–52.0)
Hemoglobin: 7.6 g/dL — ABNORMAL LOW (ref 13.0–17.0)
Immature Granulocytes: 0 %
Lymphocytes Relative: 58 %
Lymphs Abs: 0.7 K/uL (ref 0.7–4.0)
MCH: 25.6 pg — ABNORMAL LOW (ref 26.0–34.0)
MCHC: 33 g/dL (ref 30.0–36.0)
MCV: 77.4 fL — ABNORMAL LOW (ref 80.0–100.0)
Monocytes Absolute: 0.2 K/uL (ref 0.1–1.0)
Monocytes Relative: 16 %
Neutro Abs: 0.3 K/uL — CL (ref 1.7–7.7)
Neutrophils Relative %: 23 %
Platelet Count: 39 K/uL — ABNORMAL LOW (ref 150–400)
RBC: 2.97 MIL/uL — ABNORMAL LOW (ref 4.22–5.81)
RDW: 19.2 % — ABNORMAL HIGH (ref 11.5–15.5)
Smear Review: NORMAL
WBC Count: 1.2 K/uL — ABNORMAL LOW (ref 4.0–10.5)
nRBC: 0 % (ref 0.0–0.2)

## 2024-07-18 NOTE — Progress Notes (Signed)
 CRITICAL VALUE STICKER  CRITICAL VALUE: ANC 0.3  RECEIVER (on-site recipient of call): Rosina   DATE & TIME NOTIFIED: 07/18/24 @ 4:28  MESSENGER (representative from lab): Heather  MD NOTIFIED: Charlott, PA  TIME OF NOTIFICATION: 4:29  RESPONSE: 3 days of GCSF.

## 2024-07-19 ENCOUNTER — Other Ambulatory Visit

## 2024-07-19 ENCOUNTER — Inpatient Hospital Stay

## 2024-07-19 ENCOUNTER — Telehealth: Payer: Self-pay | Admitting: Medical Oncology

## 2024-07-19 DIAGNOSIS — R6889 Other general symptoms and signs: Secondary | ICD-10-CM | POA: Diagnosis not present

## 2024-07-19 NOTE — Telephone Encounter (Signed)
 Pt confirmed appts today for lab ,injection and blood transfusion tomorrow. He will call [facility] for transportation.

## 2024-07-20 ENCOUNTER — Telehealth: Payer: Self-pay | Admitting: Medical Oncology

## 2024-07-20 ENCOUNTER — Other Ambulatory Visit

## 2024-07-20 ENCOUNTER — Inpatient Hospital Stay

## 2024-07-20 ENCOUNTER — Ambulatory Visit

## 2024-07-20 ENCOUNTER — Inpatient Hospital Stay (HOSPITAL_BASED_OUTPATIENT_CLINIC_OR_DEPARTMENT_OTHER): Admitting: Physician Assistant

## 2024-07-20 ENCOUNTER — Other Ambulatory Visit: Payer: Self-pay | Admitting: Medical Oncology

## 2024-07-20 VITALS — BP 105/68 | HR 82 | Temp 98.2°F | Resp 16

## 2024-07-20 DIAGNOSIS — I809 Phlebitis and thrombophlebitis of unspecified site: Secondary | ICD-10-CM

## 2024-07-20 DIAGNOSIS — D709 Neutropenia, unspecified: Secondary | ICD-10-CM

## 2024-07-20 DIAGNOSIS — C3491 Malignant neoplasm of unspecified part of right bronchus or lung: Secondary | ICD-10-CM | POA: Diagnosis not present

## 2024-07-20 DIAGNOSIS — Z5111 Encounter for antineoplastic chemotherapy: Secondary | ICD-10-CM | POA: Diagnosis not present

## 2024-07-20 DIAGNOSIS — D649 Anemia, unspecified: Secondary | ICD-10-CM

## 2024-07-20 LAB — CMP (CANCER CENTER ONLY)
ALT: 9 U/L (ref 0–44)
AST: 17 U/L (ref 15–41)
Albumin: 3.6 g/dL (ref 3.5–5.0)
Alkaline Phosphatase: 59 U/L (ref 38–126)
Anion gap: 5 (ref 5–15)
BUN: 14 mg/dL (ref 6–20)
CO2: 29 mmol/L (ref 22–32)
Calcium: 9.3 mg/dL (ref 8.9–10.3)
Chloride: 106 mmol/L (ref 98–111)
Creatinine: 1 mg/dL (ref 0.61–1.24)
GFR, Estimated: >60 mL/min (ref >60)
Glucose, Bld: 82 mg/dL (ref 70–99)
Potassium: 3.8 mmol/L (ref 3.5–5.1)
Sodium: 140 mmol/L (ref 135–145)
Total Bilirubin: 0.2 mg/dL (ref 0.0–1.2)
Total Protein: 7.5 g/dL (ref 6.5–8.1)

## 2024-07-20 LAB — PREPARE RBC (CROSSMATCH)

## 2024-07-20 LAB — CBC WITH DIFFERENTIAL (CANCER CENTER ONLY)
Abs Immature Granulocytes: 0.01 K/uL (ref 0.00–0.07)
Basophils Absolute: 0 K/uL (ref 0.0–0.1)
Basophils Relative: 1 %
Eosinophils Absolute: 0.1 K/uL (ref 0.0–0.5)
Eosinophils Relative: 3 %
HCT: 23.3 % — ABNORMAL LOW (ref 39.0–52.0)
Hemoglobin: 7.6 g/dL — ABNORMAL LOW (ref 13.0–17.0)
Immature Granulocytes: 1 %
Lymphocytes Relative: 51 %
Lymphs Abs: 0.8 K/uL (ref 0.7–4.0)
MCH: 25.6 pg — ABNORMAL LOW (ref 26.0–34.0)
MCHC: 32.6 g/dL (ref 30.0–36.0)
MCV: 78.5 fL — ABNORMAL LOW (ref 80.0–100.0)
Monocytes Absolute: 0.3 K/uL (ref 0.1–1.0)
Monocytes Relative: 19 %
Neutro Abs: 0.4 K/uL — CL (ref 1.7–7.7)
Neutrophils Relative %: 25 %
Platelet Count: 70 K/uL — ABNORMAL LOW (ref 150–400)
RBC: 2.97 MIL/uL — ABNORMAL LOW (ref 4.22–5.81)
RDW: 20.5 % — ABNORMAL HIGH (ref 11.5–15.5)
Smear Review: NORMAL
WBC Count: 1.5 K/uL — ABNORMAL LOW (ref 4.0–10.5)
nRBC: 0 % (ref 0.0–0.2)

## 2024-07-20 LAB — ABO/RH: ABO/RH(D): B NEG

## 2024-07-20 MED ORDER — ACETAMINOPHEN 325 MG PO TABS
650.0000 mg | ORAL_TABLET | Freq: Once | ORAL | Status: AC
  Administered 2024-07-20: 650 mg via ORAL
  Filled 2024-07-20: qty 2

## 2024-07-20 MED ORDER — DIPHENHYDRAMINE HCL 25 MG PO CAPS
25.0000 mg | ORAL_CAPSULE | Freq: Once | ORAL | Status: AC
  Administered 2024-07-20: 25 mg via ORAL
  Filled 2024-07-20: qty 1

## 2024-07-20 MED ORDER — FILGRASTIM-SNDZ 480 MCG/0.8ML IJ SOSY
480.0000 ug | PREFILLED_SYRINGE | Freq: Once | INTRAMUSCULAR | Status: AC
  Administered 2024-07-20: 480 ug via SUBCUTANEOUS
  Filled 2024-07-20: qty 0.8

## 2024-07-20 MED ORDER — SODIUM CHLORIDE 0.9% IV SOLUTION
250.0000 mL | INTRAVENOUS | Status: DC
  Administered 2024-07-20: 250 mL via INTRAVENOUS

## 2024-07-20 NOTE — Telephone Encounter (Signed)
 Dr Celena referred pt to Dr. Tanda orthopedic oncology at [facility]. Per Care Everywhere pt seen by  Dr Dorothe Southgate ,MD . I lvm on her phone and Warren Crete, RN and Jameson Pollen to return my call re pt pathological fracture.  Spoke to Jameson Pollen, NP and pt has appt with her on Tuesday. Her nurse will call pt with appt information. Pt notified to expect a call from her office.  Requested Canopy push image of Ct humerus to [facility].

## 2024-07-20 NOTE — Telephone Encounter (Signed)
 CRITICAL VALUE STICKER  CRITICAL VALUE:ANC=0.4  RECEIVER (on-site recipient of call):Anays Detore, RN  DATE & TIME NOTIFIED:  07/20/2024 @ 1400 MESSENGER (representative from lab):Amber  MD NOTIFIED: Cassie Heilingoetter was aware   TIME OF NOTIFICATION:  RESPONSE:  pt receiving GCSF -several doses

## 2024-07-20 NOTE — Progress Notes (Addendum)
 [facility] Acute Visit  Benjamin Clam, MD [address] [place] [address] [place] [address]  DIAGNOSIS: Stage IV (T3, N3, M1 C) non-small cell lung cancer, adenocarcinoma presented with large right upper lobe lung mass in addition to right hilar and mediastinal as well as right supraclavicular lymphadenopathy and metastatic bone lesions including the left proximal humerus, L2 as well as sacral area and left adrenal metastasis diagnosed in June 2025.   Molecular studies show PDL1 of 0%   Biomarker Findings Blood Tumor Mutational Burden - 37 Muts/Mb ctDNA Tumor Fraction - High (2.4%) Microsatellite status - MSI-High Not Detected Genomic Findings For a complete list of the genes assayed, please refer to the Appendix. ARID1A E2250* KEAP1 G524C, G448fs*23 CTNNA1 splice site 2192+1G>C DNMT3A R882C TET2 K1197* TP53 splice site [id number]_[id number], deletion exons 8-9 This assay tested >300 cancer-related genes, including the following 8 gene(s) routinely assessed in this tumor type: ALK, BRAF, EGFR, ERBB2, KRAS, MET, RET, ROS1.  PRIOR THERAPY:  1) Palliative radiation to the painful bone lesion in the left humerus. Last day on 05/03/24.  2) SRS to the brain lesion, completed on on 06/20/24  CURRENT THERAPY: Systemic chemotherapy with Carboplatin  for an AUC of 5, Alimta  500 mg/m2, and Libatyo IV every 3 weeks. First dose on 05/02/24. Doses of Alimta  reduced to 400 mg/m2 starting from cycle #2 due to HIV and neutropenia.  Status post 4 cycles.   INTERVAL HISTORY: Benjamin Herrera 58 y.o. male returns clinic today for blood transfusion and for G-CSF injections.  I was asked to see the patient in the infusion room while he was receiving his blood for right upper extremity discoloration and pain.  They are located at the sites of prior IV chemotherapy administration.  There is no warmth or heat.  There is some skin darkening in this area and tenderness to palpation.  He is not  interested in a Port-A-Cath.  He is neutropenic at this time.  He denies any fever or chills.  Patient has HIV .  He is followed by infectious disease.  Per chart review he supposed to be taking Bactrim  3 times a week.  A prescription was sent on 06/20/2024.  When asked about this further, the patient is currently not taking these because the pills were reportedly too big.   He is inquiring about the results of his repeat scan from last week.  He has a pathological fracture in the left humerus.  He did see an orthopedic surgeon previously and was told that surgery is not an option for him.  He does report pain in the left arm.  He is followed closely by palliative care.  He is being seen today for evaluation of a right upper extremity.  MEDICAL HISTORY: Past Medical History:  Diagnosis Date   Achalasia and cardiospasm    Allergy    Anxiety    Arthritis    back ,shoulders    Asthma    Boils    under arms hx of   CAD (coronary artery disease)    a. 09/2016: cath showing 100% RCA occlusion with collaterals and 90% OM1 (treated with DES)   Candidiasis of mouth    Candidiasis of unspecified site    Cholelithiasis    Cocaine abuse (HCC)    Quit 09/2016   Condyloma acuminatum in male of scrotum & anal canal s/p laser ablation 07/03/2012   Coronary artery disease    Dysphagia    Fracture, humerus  Frozen shoulder    left   GERD (gastroesophageal reflux disease)    Heart attack (HCC)    Hemorrhoids    Hepatitis 1993   A   Herpes labialis    HIV (human immunodeficiency virus infection) (HCC) DX 1993   Hypertension Dx 2014   off all bp meds for last 9 or 10 months   Pilonidal disease 01/10/2012   Polysubstance abuse (HCC)    Psoriasis    hx of   Sleep apnea    does  have CPAP   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  MEDICATIONS:  Current Outpatient Medications  Medication Sig Dispense Refill   acetaminophen  (TYLENOL ) 325 MG tablet Take 650 mg by mouth every 6 (six) hours as needed for mild pain (pain score 1-3), moderate pain (pain score 4-6) or headache.     albuterol  (VENTOLIN  HFA) 108 (90 Base) MCG/ACT inhaler Inhale 2 puffs into the lungs every 6 (six) hours as needed for wheezing or shortness of breath. 1 each 2   amLODipine  (NORVASC ) 5 MG tablet Take 1 tablet (5 mg total) by mouth daily. 90 tablet 1   amoxicillin -clavulanate (AUGMENTIN ) 875-125 MG tablet Take 1 tablet by mouth 2 (two) times daily. 20 tablet 0   aspirin  81 MG chewable tablet Chew 1 tablet (81 mg total) by mouth daily. 90 tablet 3   Bictegravir-Emtricitab-Tenofov (BIKTARVY ) 30-120-15 MG TABS Take 1 tablet by mouth daily. 90 tablet 3   Blood Pressure KIT 1 kit by Does not apply route daily. 1 kit 0   diclofenac  Sodium (VOLTAREN  ARTHRITIS PAIN) 1 % GEL Apply 2 g topically 4 (four) times daily. 100 g 3   folic acid  (FOLVITE ) 1 MG tablet Take 1 tablet (1 mg total) by mouth daily. Start 7 days before pemetrexed  chemotherapy. Continue until 21 days after pemetrexed  completed. 100 tablet 3   HYDROcodone -acetaminophen  (NORCO/VICODIN) 5-325 MG tablet Take 1 tablet by mouth every 6 (six) hours as needed for moderate pain (pain score 4-6).     hydrOXYzine  (ATARAX ) 25 MG tablet Take 1 tablet (25 mg total) by mouth 3 (three) times daily as needed. 30 tablet 0   lidocaine  (XYLOCAINE ) 2 % solution SWISH AND SWALLOW 5 MLS 3 (THREE) TIMES DAILY AS NEEDED FOR MOUTH PAIN. 360 mL 1   lidocaine -prilocaine  (EMLA ) cream Apply to affected area once 30 g 3   LORazepam  (ATIVAN ) 0.5 MG tablet 1 tab po 30 minutes prior to radiation or MRI scans 4 tablet 0   magic mouthwash (nystatin , diphenhydrAMINE , alum & mag hydroxide) suspension mixture Take 5 mLs by mouth 3 (three) times daily as needed for mouth pain. 240 mL 1   methocarbamol  (ROBAXIN ) 500 MG tablet  Take 2 tablets (1,000 mg total) by mouth every 8 (eight) hours as needed. 90 tablet 1   metoprolol  succinate (TOPROL -XL) 25 MG 24 hr tablet TAKE 1/2 TABLET BY MOUTH DAILY 45 tablet 0   nitroGLYCERIN  (NITROSTAT ) 0.4 MG SL tablet Place 1 tablet (0.4 mg total) under the tongue every 5 (five) minutes as needed for chest pain (trouble swallowing). 25 tablet 1   nystatin  (MYCOSTATIN ) 100000 UNIT/ML suspension Take by mouth.     ondansetron  (ZOFRAN ) 8 MG tablet Take 1 tablet (8 mg total) by mouth every 8 (eight) hours as needed for nausea or vomiting. Start on the  third day after carboplatin . 30 tablet 1   oxyCODONE -acetaminophen  (PERCOCET/ROXICET) 5-325 MG tablet Take 1 tablet by mouth every 8 (eight) hours as needed for severe pain (pain score 7-10). 30 tablet 0   prochlorperazine  (COMPAZINE ) 10 MG tablet Take 1 tablet (10 mg total) by mouth every 6 (six) hours as needed for nausea or vomiting. 30 tablet 1   sulfamethoxazole -trimethoprim  (BACTRIM  DS) 800-160 MG tablet Take 1 tablet by mouth 3 (three) times a week. 36 tablet 3   No current facility-administered medications for this visit.   Facility-Administered Medications Ordered in Other Visits  Medication Dose Route Frequency Provider Last Rate Last Admin   0.9 %  sodium chloride  infusion (Manually program via Guardrails IV Fluids)  250 mL Intravenous Continuous Jefferey Lippmann L, PA-C 10 mL/hr at 07/20/24 1340 250 mL at 07/20/24 1340   filgrastim -sndz (ZARXIO ) injection 480 mcg  480 mcg Subcutaneous Once Maisha Bogen L, PA-C        SURGICAL HISTORY:  Past Surgical History:  Procedure Laterality Date   BALLOON DILATION N/A 11/11/2015   Procedure: BALLOON DILATION;  Surgeon: Gustav Shila GAILS, MD;  Location: [facility];  Service: Endoscopy;  Laterality: N/A;   BOTOX  INJECTION  10/11/2012   Procedure: BOTOX  INJECTION;  Surgeon: Lamar JONETTA Aho, MD;  Location: [facility];  Service: Endoscopy;  Laterality: N/A;   BOTOX   INJECTION N/A 01/25/2013   Procedure: BOTOX  INJECTION;  Surgeon: Lamar JONETTA Aho, MD;  Location: [facility];  Service: Endoscopy;  Laterality: N/A;   BOTOX  INJECTION N/A 06/21/2013   Procedure: BOTOX  INJECTION;  Surgeon: Lamar JONETTA Aho, MD;  Location: [facility];  Service: Endoscopy;  Laterality: N/A;   BOTOX  INJECTION N/A 10/08/2013   Procedure: BOTOX  INJECTION;  Surgeon: Lamar JONETTA Aho, MD;  Location: [facility];  Service: Endoscopy;  Laterality: N/A;   BOTOX  INJECTION N/A 06/16/2015   Procedure: BOTOX  INJECTION;  Surgeon: Lamar JONETTA Aho, MD;  Location: [facility];  Service: Endoscopy;  Laterality: N/A;   BRONCHIAL BIOPSY  04/03/2024   Procedure: BRONCHOSCOPY, WITH BIOPSY;  Surgeon: Shelah Lamar RAMAN, MD;  Location: [facility];  Service: Pulmonary;;   BRONCHIAL BRUSHINGS  04/03/2024   Procedure: BRONCHOSCOPY, WITH BRUSH BIOPSY;  Surgeon: Shelah Lamar RAMAN, MD;  Location: [facility];  Service: Pulmonary;;   BRONCHIAL NEEDLE ASPIRATION BIOPSY  04/03/2024   Procedure: BRONCHOSCOPY, WITH NEEDLE ASPIRATION BIOPSY;  Surgeon: Shelah Lamar RAMAN, MD;  Location: [facility];  Service: Pulmonary;;   BRONCHOSCOPY, WITH BIOPSY USING ELECTROMAGNETIC NAVIGATION Right 04/03/2024   Procedure: BRONCHOSCOPY, WITH BIOPSY USING ELECTROMAGNETIC NAVIGATION;  Surgeon: Shelah Lamar RAMAN, MD;  Location: [facility];  Service: Pulmonary;  Laterality: Right;   CARDIAC CATHETERIZATION N/A 10/11/2016   Procedure: Left Heart Cath and Coronary Angiography;  Surgeon: Lonni JONETTA Cash, MD;  mRCA 100% w/ L>R collaterals, OM1 90%   CARDIAC CATHETERIZATION N/A 10/11/2016   Procedure: Coronary Stent Intervention;  Surgeon: Lonni JONETTA Cash, MD;  RESOLUTE ONYX 3.5X15 DES OM1   COLONOSCOPY  05/19/2012   Procedure: COLONOSCOPY;  Surgeon: Lamar JONETTA Aho, MD;  Location: [facility];  Service: Endoscopy;  Laterality: N/A;  jill trying to contact pt to come in 0830 for 930 case, phone not accepting messages   COLONOSCOPY      ESOPHAGEAL MANOMETRY  05/29/2012   Procedure: ESOPHAGEAL MANOMETRY (EM);  Surgeon: Lamar JONETTA Aho, MD;  Location: [facility];  Service: Endoscopy;  Laterality: N/A;   ESOPHAGEAL MANOMETRY N/A 10/06/2015   Procedure: ESOPHAGEAL MANOMETRY (EM);  Surgeon: Kavitha Nandigam V,  MD;  Location: [facility];  Service: Endoscopy;  Laterality: N/A;   ESOPHAGOGASTRODUODENOSCOPY  11/11/2011   Procedure: ESOPHAGOGASTRODUODENOSCOPY (EGD);  Surgeon: Lamar JONETTA Aho, MD;  Location: [facility];  Service: Endoscopy;  Laterality: N/A;  botox  injection  called Pt to change time of procedure per Dr Aho   ESOPHAGOGASTRODUODENOSCOPY  03/10/2012   Procedure: ESOPHAGOGASTRODUODENOSCOPY (EGD);  Surgeon: Lamar JONETTA Aho, MD;  Location: [facility];  Service: Endoscopy;  Laterality: N/A;   ESOPHAGOGASTRODUODENOSCOPY  10/11/2012   Procedure: ESOPHAGOGASTRODUODENOSCOPY (EGD);  Surgeon: Lamar JONETTA Aho, MD;  Location: [facility];  Service: Endoscopy;  Laterality: N/A;   ESOPHAGOGASTRODUODENOSCOPY N/A 01/25/2013   Procedure: ESOPHAGOGASTRODUODENOSCOPY (EGD);  Surgeon: Lamar JONETTA Aho, MD;  Location: [facility];  Service: Endoscopy;  Laterality: N/A;   ESOPHAGOGASTRODUODENOSCOPY N/A 06/21/2013   Procedure: ESOPHAGOGASTRODUODENOSCOPY (EGD);  Surgeon: Lamar JONETTA Aho, MD;  Location: [facility];  Service: Endoscopy;  Laterality: N/A;   ESOPHAGOGASTRODUODENOSCOPY (EGD) WITH PROPOFOL  N/A 10/08/2013   Procedure: ESOPHAGOGASTRODUODENOSCOPY (EGD) WITH PROPOFOL ;  Surgeon: Lamar JONETTA Aho, MD;  Location: [facility];  Service: Endoscopy;  Laterality: N/A;   ESOPHAGOGASTRODUODENOSCOPY (EGD) WITH PROPOFOL  N/A 06/16/2015   Procedure: ESOPHAGOGASTRODUODENOSCOPY (EGD) WITH PROPOFOL ;  Surgeon: Lamar JONETTA Aho, MD;  Location: [facility];  Service: Endoscopy;  Laterality: N/A;   ESOPHAGOGASTRODUODENOSCOPY (EGD) WITH PROPOFOL  N/A 11/11/2015   Procedure: ESOPHAGOGASTRODUODENOSCOPY (EGD) WITH PROPOFOL ;  Surgeon: Gustav Shila GAILS, MD;   Location: [facility];  Service: Endoscopy;  Laterality: N/A;   EXAMINATION UNDER ANESTHESIA  07/24/2012   Procedure: EXAM UNDER ANESTHESIA;  Surgeon: Elspeth KYM Schultze, MD;  Location: [facility];  Service: General;  Laterality: N/A;   humeral fracture surgery Right yrs ago   PILONIDAL CYST EXCISION  07/24/2012   Procedure: CYST EXCISION PILONIDAL SIMPLE;  Surgeon: Elspeth KYM Schultze, MD;  Location: [facility];  Service: General;  Laterality: N/A;  Exam Under Anesthesia,, Excision Pilonidal Disease,    right shoulder replacement  2008   x 2   UPPER GASTROINTESTINAL ENDOSCOPY     WART FULGURATION  07/24/2012   Procedure: FULGURATION ANAL WART;  Surgeon: Elspeth KYM Schultze, MD;  Location: [facility];  Service: General;  Laterality: N/A;  excision of raphe mass    REVIEW OF SYSTEMS:   Review of Systems  Constitutional: Positive for fatigue. Negative for appetite change, chills, fever and unexpected weight change.  HENT: Negative for mouth sores, nosebleeds, sore throat and trouble swallowing.   Eyes: Negative for eye problems and icterus.  Respiratory: Negative for cough, hemoptysis, shortness of breath and wheezing.   Cardiovascular: Negative for chest pain and leg swelling.  Gastrointestinal: Negative for abdominal pain, constipation, diarrhea, nausea and vomiting.  Genitourinary: Negative for bladder incontinence, difficulty urinating, dysuria, frequency and hematuria.   Musculoskeletal: Positive for left arm pain and decreased ROM. Negative for gait problem, neck pain and neck stiffness.  Skin: Positive for skin darkening and subcutaneous knots along the vessel in the right upper extremity.   Neurological: Negative for dizziness, extremity weakness, gait problem, headaches, light-headedness and seizures.  Hematological: Negative for adenopathy. Does not bruise/bleed easily.  Psychiatric/Behavioral: Negative for confusion, depression and sleep disturbance. The patient is not nervous/anxious.     PHYSICAL  EXAMINATION:  There were no vitals taken for this visit.  ECOG PERFORMANCE STATUS: 1  Physical Exam  Constitutional: Oriented to person, place, and time and well-developed, well-nourished, and in no distress.  HENT:  Head: Normocephalic and atraumatic.  Mouth/Throat: Oropharynx is clear and moist. No oropharyngeal exudate.  Eyes: Conjunctivae are  normal. Right eye exhibits no discharge. Left eye exhibits no discharge. No scleral icterus.  Neck: Normal range of motion. Neck supple.  Cardiovascular: Normal rate, regular rhythm, normal heart sounds and intact distal pulses.   Pulmonary/Chest: Effort normal and breath sounds normal. No respiratory distress. No wheezes. No rales.  Abdominal: Soft. Bowel sounds are normal. Exhibits no distension and no mass. There is no tenderness.  Musculoskeletal: Normal range of motion. Exhibits no edema.  Lymphadenopathy:    No cervical adenopathy.  Neurological: Alert and oriented to person, place, and time. Exhibits normal muscle tone. Gait normal. Coordination normal.  Skin: Skin is warm and dry. Skin darkening and firmness along vein in right upper extremity from prior chemotherapy site. No warmth or swelling. Area is tender to touch. No rash noted. Not diaphoretic. No erythema. No pallor.  Psychiatric: Mood, memory and judgment normal.  Vitals reviewed.  LABORATORY DATA: Lab Results  Component Value Date   WBC 1.5 (L) 07/20/2024   HGB 7.6 (L) 07/20/2024   HCT 23.3 (L) 07/20/2024   MCV 78.5 (L) 07/20/2024   PLT 70 (L) 07/20/2024      Chemistry      Component Value Date/Time   NA 140 07/20/2024 1213   NA 136 06/19/2024 1445   K 3.8 07/20/2024 1213   CL 106 07/20/2024 1213   CO2 29 07/20/2024 1213   BUN 14 07/20/2024 1213   BUN 20 06/19/2024 1445   CREATININE 1.00 07/20/2024 1213   CREATININE 0.96 07/30/2020 1437      Component Value Date/Time   CALCIUM  9.3 07/20/2024 1213   ALKPHOS 59 07/20/2024 1213   AST 17 07/20/2024 1213   ALT 9  07/20/2024 1213   BILITOT 0.2 07/20/2024 1213       RADIOGRAPHIC STUDIES:  CT CHEST ABDOMEN PELVIS W CONTRAST Result Date: 07/11/2024 CLINICAL DATA:  Metastatic lung cancer restaging, ongoing chemotherapy, XRT complete * Tracking Code: BO * EXAM: CT CHEST, ABDOMEN, AND PELVIS WITH CONTRAST CT LEFT HUMERUS WITH CONTRAST TECHNIQUE: Multidetector CT imaging of the chest, abdomen and pelvis was performed following the standard protocol during bolus administration of intravenous contrast. Multidetector CT imaging of the left humerus was performed following the standard protocol during bolus administration of intravenous contrast. RADIATION DOSE REDUCTION: This exam was performed according to the departmental dose-optimization program which includes automated exposure control, adjustment of the mA and/or kV according to patient size and/or use of iterative reconstruction technique. CONTRAST:  OMNIPAQUE  IOHEXOL  300 MG/ML  SOLN COMPARISON:  PET-CT, 04/09/2024, CT chest abdomen pelvis, 03/26/2024 FINDINGS: CT CHEST FINDINGS Cardiovascular: Aortic atherosclerosis. Normal heart size. No pericardial effusion. Mediastinum/Nodes: Diminished size of a right hilar lymph node, measuring 1.4 x 1.3 cm, previously 2.7 x 2.0 cm (series 2, image 31). Treated, matted soft tissue about the right hilum without other discretely enlarged lymph nodes. Long segment circumferential wall thickening of the mid and lower esophagus (series 2, image 35). Thyroid  gland and trachea demonstrate no significant findings. Lungs/Pleura: Slight interval decrease in size of a spiculated pleural-based mass in the medial anterior right apex measuring 3.5 x 2.2 cm, previously 4.1 x 3.0 cm (series 7, image 38). Diffuse bilateral bronchial wall thickening, more conspicuous on the right. Mild, predominantly paraseptal emphysema. No pleural effusion or pneumothorax. Musculoskeletal: No chest wall abnormality. No acute osseous findings. CT ABDOMEN  PELVIS FINDINGS Hepatobiliary: No solid liver abnormality is seen. Gallstone. No gallbladder wall thickening, or biliary dilatation. Pancreas: Unremarkable. No pancreatic ductal dilatation or surrounding inflammatory changes.  Spleen: Normal in size without significant abnormality. Adrenals/Urinary Tract: Adrenal glands are unremarkable. Kidneys are normal, without renal calculi, solid lesion, or hydronephrosis. Bladder is unremarkable. Stomach/Bowel: Stomach is within normal limits. Appendix appears normal. No evidence of bowel wall thickening, distention, or inflammatory changes. Vascular/Lymphatic: Aortic atherosclerosis. No enlarged abdominal or pelvic lymph nodes. Reproductive: No mass or other abnormality. Other: No abdominal wall hernia or abnormality. No ascites. Musculoskeletal: No acute osseous findings. CT LEFT HUMERUS Osseous: Partial interval sclerotic healing of a lytic metastasis of the proximal left humerus with an associated subacute pathologic fracture. Musculoskeletal and Soft Tissues: Small lipoma within the left deltoid. Otherwise unremarkable. IMPRESSION: 1. Slight interval decrease in size of a spiculated pleural-based mass in the medial anterior right apex. 2. Diminished size of a right hilar lymph node. Treated, matted soft tissue about the right hilum without other residual discretely enlarged lymph nodes. 3. Partial interval sclerotic healing of a lytic metastasis of the proximal left humerus with an associated subacute pathologic fracture. 4. No evidence of lymphadenopathy or metastatic disease in the abdomen or pelvis. 5. Long segment circumferential wall thickening of the mid and lower esophagus, consistent with nonspecific infectious or inflammatory esophagitis. 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 6. Emphysema and diffuse bilateral bronchial wall thickening. 7. Cholelithiasis. Aortic Atherosclerosis (ICD10-I70.0) and Emphysema (ICD10-J43.9).  Electronically Signed   By: Marolyn JONETTA Jaksch M.D.   On: 07/11/2024 19:01   CT Humerus Left W Contrast Result Date: 07/11/2024 CLINICAL DATA:  Metastatic lung cancer restaging, ongoing chemotherapy, XRT complete * Tracking Code: BO * EXAM: CT CHEST, ABDOMEN, AND PELVIS WITH CONTRAST CT LEFT HUMERUS WITH CONTRAST TECHNIQUE: Multidetector CT imaging of the chest, abdomen and pelvis was performed following the standard protocol during bolus administration of intravenous contrast. Multidetector CT imaging of the left humerus was performed following the standard protocol during bolus administration of intravenous contrast. RADIATION DOSE REDUCTION: This exam was performed according to the departmental dose-optimization program which includes automated exposure control, adjustment of the mA and/or kV according to patient size and/or use of iterative reconstruction technique. CONTRAST:  OMNIPAQUE  IOHEXOL  300 MG/ML  SOLN COMPARISON:  PET-CT, 04/09/2024, CT chest abdomen pelvis, 03/26/2024 FINDINGS: CT CHEST FINDINGS Cardiovascular: Aortic atherosclerosis. Normal heart size. No pericardial effusion. Mediastinum/Nodes: Diminished size of a right hilar lymph node, measuring 1.4 x 1.3 cm, previously 2.7 x 2.0 cm (series 2, image 31). Treated, matted soft tissue about the right hilum without other discretely enlarged lymph nodes. Long segment circumferential wall thickening of the mid and lower esophagus (series 2, image 35). Thyroid  gland and trachea demonstrate no significant findings. Lungs/Pleura: Slight interval decrease in size of a spiculated pleural-based mass in the medial anterior right apex measuring 3.5 x 2.2 cm, previously 4.1 x 3.0 cm (series 7, image 38). Diffuse bilateral bronchial wall thickening, more conspicuous on the right. Mild, predominantly paraseptal emphysema. No pleural effusion or pneumothorax. Musculoskeletal: No chest wall abnormality. No acute osseous findings. CT ABDOMEN PELVIS FINDINGS  Hepatobiliary: No solid liver abnormality is seen. Gallstone. No gallbladder wall thickening, or biliary dilatation. Pancreas: Unremarkable. No pancreatic ductal dilatation or surrounding inflammatory changes. Spleen: Normal in size without significant abnormality. Adrenals/Urinary Tract: Adrenal glands are unremarkable. Kidneys are normal, without renal calculi, solid lesion, or hydronephrosis. Bladder is unremarkable. Stomach/Bowel: Stomach is within normal limits. Appendix appears normal. No evidence of bowel wall thickening, distention, or inflammatory changes. Vascular/Lymphatic: Aortic atherosclerosis. No enlarged abdominal or pelvic lymph nodes. Reproductive: No mass or other abnormality.  Other: No abdominal wall hernia or abnormality. No ascites. Musculoskeletal: No acute osseous findings. CT LEFT HUMERUS Osseous: Partial interval sclerotic healing of a lytic metastasis of the proximal left humerus with an associated subacute pathologic fracture. Musculoskeletal and Soft Tissues: Small lipoma within the left deltoid. Otherwise unremarkable. IMPRESSION: 1. Slight interval decrease in size of a spiculated pleural-based mass in the medial anterior right apex. 2. Diminished size of a right hilar lymph node. Treated, matted soft tissue about the right hilum without other residual discretely enlarged lymph nodes. 3. Partial interval sclerotic healing of a lytic metastasis of the proximal left humerus with an associated subacute pathologic fracture. 4. No evidence of lymphadenopathy or metastatic disease in the abdomen or pelvis. 5. Long segment circumferential wall thickening of the mid and lower esophagus, consistent with nonspecific infectious or inflammatory esophagitis. 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 6. Emphysema and diffuse bilateral bronchial wall thickening. 7. Cholelithiasis. Aortic Atherosclerosis (ICD10-I70.0) and Emphysema (ICD10-J43.9). Electronically Signed   By:  Marolyn JONETTA Jaksch M.D.   On: 07/11/2024 19:01     ASSESSMENT/PLAN:  This is a very pleasant 58 years old African-American male recently diagnosed with a stage IV (T3, N3, M1 C) non-small cell lung cancer, adenocarcinoma presented with large right upper lobe lung mass in addition to right hilar and mediastinal as well as right supraclavicular lymphadenopathy and metastatic bone lesions including the left proximal humerus, L2 as well as sacral area and left adrenal metastasis diagnosed in June 2025.   His molecular studies show no actionable mutations. He has high TMB. 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   The patient is currently on chemotherapy at a reduced dose with carboplatin  for an AUC of 4 and Alimta  400 mg/m, and Libtayo  IV every 3 weeks.  He is status post 4 cycles of treatment.   The patient is neutropenic and has anemia.  He is scheduled for 1 unit of blood today.  He is also receiving Zarzio for 3 days today, tomorrow, and Monday for his neutropenia.  The patient has HIV.  Per chart review it looks like Dr. Eben had recently wanted to start him on Bactrim  3 times a week.  The patient is not taking this due to the pills being too big.  I let him know that if they are scored he can cut them in half.  I would strongly encourage him to take this especially considering his chemotherapy, immunocompromise state, and neutropenia.  The area on his right upper extremity does not appear infected but should there be an underlying infectious component the antibiotics may help.  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  I will arrange for a Doppler ultrasound just given his history and complicated medical history to rule out DVT or less likely abscess given his neutropenia and HIV.  The area was not warm, swollen, or erythematous.  I instructed him to use heating pads on the thrombophlebitis.  The patient is not interested in a Port-A-Cath  which would likely help prevent recurrent thrombophlebitis.  Today he is going to have his IV in his left upper extremity instead.  The patient did inquire about his CT scan results.  I let him know that it did not appear that there was any progression but Dr. Sherrod will review in more detail at his next appointment on 07/25/2024.  He will send a copy to his orthopedic provider at [facility] due to the  fracture in the left humerus to ensure they have the most updated imaging.  We called them and they offered appointment on 07/24/24. I called [employer] to push the images to their system. Follow closely with palliative care due to pain in this area.  The patient was advised to call immediately if he has any concerning symptoms in the interval. The patient voices understanding of current disease status and treatment options and is in agreement with the current care plan. All questions were answered. The patient knows to call the clinic with any problems, questions or concerns. We can certainly see the patient much sooner if necessary       Orders Placed This Encounter  Procedures   US  UPPER EXTREMITY DUPLEX RIGHT (NON-WBI)    Standing Status:   Future    Expected Date:   07/23/2024    Expiration Date:   07/20/2025    Reason for Exam (SYMPTOM  OR DIAGNOSIS REQUIRED):   On chemo, possible thrombophlebitis and discoloration from prior chemo but rule out DVT or subcutaneous abscess    Preferred imaging location?:   [facility]     The total time spent in the appointment was 30-39 minutes  Demarrio Menges L Dewel Lotter, PA-C 07/20/24

## 2024-07-20 NOTE — Patient Instructions (Signed)

## 2024-07-20 NOTE — Progress Notes (Signed)
  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      The patient was available for call today.  The patient did note fatigue during radiation, this has continued but the patient is still able to do his normal activities. The patient did not note hair loss or skin changes in the field of radiation during therapy. The patient is not taking dexamethasone . The patient does not have symptoms of  weakness or loss of control of the extremities. The patient does not have symptoms of headache. The patient does not have symptoms of seizure or uncontrolled movement. The patient does not have symptoms of changes in vision. The patient does not have changes in speech. The patient does not have confusion.   The patient was counseled that he  will be contacted by our brain and spine navigator to schedule surveillance imaging. The patient was encouraged to call if he  have not received a call to schedule imaging, or if he develops concerns or questions regarding radiation. The patient will also continue to follow up with Dr. Sherrod 07/25/2024 in medical oncology.   Called patient 07/20/2024.

## 2024-07-21 ENCOUNTER — Inpatient Hospital Stay

## 2024-07-21 VITALS — BP 129/64 | HR 95 | Temp 98.1°F | Resp 16

## 2024-07-21 DIAGNOSIS — C3491 Malignant neoplasm of unspecified part of right bronchus or lung: Secondary | ICD-10-CM

## 2024-07-21 DIAGNOSIS — Z5111 Encounter for antineoplastic chemotherapy: Secondary | ICD-10-CM | POA: Diagnosis not present

## 2024-07-21 DIAGNOSIS — D709 Neutropenia, unspecified: Secondary | ICD-10-CM

## 2024-07-21 MED ORDER — FILGRASTIM-SNDZ 480 MCG/0.8ML IJ SOSY
480.0000 ug | PREFILLED_SYRINGE | Freq: Once | INTRAMUSCULAR | Status: AC
  Administered 2024-07-21: 480 ug via SUBCUTANEOUS

## 2024-07-23 ENCOUNTER — Ambulatory Visit
Admission: RE | Admit: 2024-07-23 | Discharge: 2024-07-23 | Disposition: A | Discharge status: Home / Self Care | Source: Ambulatory Visit | Attending: Radiation Oncology | Admitting: Radiation Oncology

## 2024-07-23 ENCOUNTER — Inpatient Hospital Stay

## 2024-07-23 VITALS — BP 131/64 | HR 99 | Temp 99.4°F | Resp 17

## 2024-07-23 DIAGNOSIS — D709 Neutropenia, unspecified: Secondary | ICD-10-CM

## 2024-07-23 DIAGNOSIS — R1319 Other dysphagia: Secondary | ICD-10-CM | POA: Diagnosis not present

## 2024-07-23 DIAGNOSIS — C3491 Malignant neoplasm of unspecified part of right bronchus or lung: Secondary | ICD-10-CM

## 2024-07-23 DIAGNOSIS — Z5111 Encounter for antineoplastic chemotherapy: Secondary | ICD-10-CM | POA: Diagnosis not present

## 2024-07-23 LAB — TYPE AND SCREEN
ABO/RH(D): B NEG
Antibody Screen: NEGATIVE
Unit division: 0

## 2024-07-23 LAB — BPAM RBC
Blood Product Expiration Date: 202510192359
ISSUE DATE / TIME: 202509261507
Unit Type and Rh: 1700

## 2024-07-23 MED ORDER — DENOSUMAB 120 MG/1.7ML ~~LOC~~ SOLN
120.0000 mg | Freq: Once | SUBCUTANEOUS | Status: AC
  Administered 2024-07-23: 120 mg via SUBCUTANEOUS
  Filled 2024-07-23: qty 1.7

## 2024-07-23 MED ORDER — FILGRASTIM-SNDZ 480 MCG/0.8ML IJ SOSY
480.0000 ug | PREFILLED_SYRINGE | Freq: Once | INTRAMUSCULAR | Status: AC
  Administered 2024-07-23: 480 ug via SUBCUTANEOUS
  Filled 2024-07-23: qty 0.8

## 2024-07-23 MED ORDER — DENOSUMAB 120 MG/1.7ML ~~LOC~~ SOLN: 120.0000 mg | Freq: Once | SUBCUTANEOUS | Status: AC

## 2024-07-25 ENCOUNTER — Inpatient Hospital Stay: Attending: Physician Assistant

## 2024-07-25 ENCOUNTER — Other Ambulatory Visit: Payer: Self-pay

## 2024-07-25 ENCOUNTER — Inpatient Hospital Stay

## 2024-07-25 ENCOUNTER — Inpatient Hospital Stay: Admitting: Nurse Practitioner

## 2024-07-25 ENCOUNTER — Inpatient Hospital Stay (HOSPITAL_BASED_OUTPATIENT_CLINIC_OR_DEPARTMENT_OTHER): Admitting: Internal Medicine

## 2024-07-25 ENCOUNTER — Encounter: Payer: Self-pay | Admitting: Nurse Practitioner

## 2024-07-25 VITALS — BP 144/92 | HR 124 | Temp 97.9°F | Resp 17 | Ht 73.0 in | Wt 196.8 lb

## 2024-07-25 DIAGNOSIS — Z515 Encounter for palliative care: Secondary | ICD-10-CM

## 2024-07-25 DIAGNOSIS — C3491 Malignant neoplasm of unspecified part of right bronchus or lung: Secondary | ICD-10-CM | POA: Diagnosis not present

## 2024-07-25 DIAGNOSIS — C7951 Secondary malignant neoplasm of bone: Secondary | ICD-10-CM | POA: Insufficient documentation

## 2024-07-25 DIAGNOSIS — C3411 Malignant neoplasm of upper lobe, right bronchus or lung: Secondary | ICD-10-CM | POA: Diagnosis present

## 2024-07-25 DIAGNOSIS — Z79620 Long term (current) use of immunosuppressive biologic: Secondary | ICD-10-CM | POA: Diagnosis not present

## 2024-07-25 DIAGNOSIS — Z5111 Encounter for antineoplastic chemotherapy: Secondary | ICD-10-CM | POA: Insufficient documentation

## 2024-07-25 DIAGNOSIS — Z79631 Long term (current) use of antimetabolite agent: Secondary | ICD-10-CM | POA: Insufficient documentation

## 2024-07-25 DIAGNOSIS — K5903 Drug induced constipation: Secondary | ICD-10-CM

## 2024-07-25 DIAGNOSIS — G893 Neoplasm related pain (acute) (chronic): Secondary | ICD-10-CM | POA: Diagnosis not present

## 2024-07-25 LAB — CBC WITH DIFFERENTIAL (CANCER CENTER ONLY)
Abs Immature Granulocytes: 8.77 K/uL — ABNORMAL HIGH (ref 0.00–0.07)
Basophils Absolute: 0 K/uL (ref 0.0–0.1)
Basophils Relative: 0 %
Eosinophils Absolute: 0.2 K/uL (ref 0.0–0.5)
Eosinophils Relative: 1 %
HCT: 29.1 % — ABNORMAL LOW (ref 39.0–52.0)
Hemoglobin: 9.5 g/dL — ABNORMAL LOW (ref 13.0–17.0)
Immature Granulocytes: 27 %
Lymphocytes Relative: 10 %
Lymphs Abs: 3.3 K/uL (ref 0.7–4.0)
MCH: 26.5 pg (ref 26.0–34.0)
MCHC: 32.6 g/dL (ref 30.0–36.0)
MCV: 81.1 fL (ref 80.0–100.0)
Monocytes Absolute: 3.7 K/uL — ABNORMAL HIGH (ref 0.1–1.0)
Monocytes Relative: 11 %
Neutro Abs: 17.1 K/uL — ABNORMAL HIGH (ref 1.7–7.7)
Neutrophils Relative %: 51 %
Platelet Count: 182 K/uL (ref 150–400)
RBC: 3.59 MIL/uL — ABNORMAL LOW (ref 4.22–5.81)
RDW: 21.8 % — ABNORMAL HIGH (ref 11.5–15.5)
Smear Review: NORMAL
WBC Count: 33.1 K/uL — ABNORMAL HIGH (ref 4.0–10.5)
nRBC: 0.2 % (ref 0.0–0.2)

## 2024-07-25 LAB — URINE DRUG SCREEN
Amphetamines: NEGATIVE
Barbiturates: NEGATIVE
Benzodiazepines: NEGATIVE
Cocaine: NEGATIVE
Fentanyl: NEGATIVE
Methadone Scn, Ur: NEGATIVE
Opiates: NEGATIVE
Tetrahydrocannabinol: POSITIVE — AB

## 2024-07-25 LAB — CMP (CANCER CENTER ONLY)
ALT: 10 U/L (ref 0–44)
AST: 32 U/L (ref 15–41)
Albumin: 3.8 g/dL (ref 3.5–5.0)
Alkaline Phosphatase: 104 U/L (ref 38–126)
Anion gap: 5 (ref 5–15)
BUN: 17 mg/dL (ref 6–20)
CO2: 30 mmol/L (ref 22–32)
Calcium: 9.4 mg/dL (ref 8.9–10.3)
Chloride: 104 mmol/L (ref 98–111)
Creatinine: 1.22 mg/dL (ref 0.61–1.24)
GFR, Estimated: >60 mL/min (ref >60)
Glucose, Bld: 101 mg/dL — ABNORMAL HIGH (ref 70–99)
Potassium: 3.8 mmol/L (ref 3.5–5.1)
Sodium: 139 mmol/L (ref 135–145)
Total Bilirubin: 0.3 mg/dL (ref 0.0–1.2)
Total Protein: 7.9 g/dL (ref 6.5–8.1)

## 2024-07-25 MED ORDER — PROCHLORPERAZINE MALEATE 10 MG PO TABS
10.0000 mg | ORAL_TABLET | Freq: Once | ORAL | Status: AC
  Administered 2024-07-25: 10 mg via ORAL
  Filled 2024-07-25: qty 1

## 2024-07-25 MED ORDER — SODIUM CHLORIDE 0.9 % IV SOLN
350.0000 mg | Freq: Once | INTRAVENOUS | Status: AC
  Administered 2024-07-25: 350 mg via INTRAVENOUS
  Filled 2024-07-25: qty 7

## 2024-07-25 MED ORDER — SODIUM CHLORIDE 0.9 % IV SOLN: INTRAVENOUS | Status: DC

## 2024-07-25 MED ORDER — SODIUM CHLORIDE 0.9 % IV SOLN
400.0000 mg/m2 | Freq: Once | INTRAVENOUS | Status: AC
  Administered 2024-07-25: 800 mg via INTRAVENOUS
  Filled 2024-07-25: qty 20

## 2024-07-25 MED ORDER — OXYCODONE-ACETAMINOPHEN 5-325 MG PO TABS: 1.0000 | ORAL_TABLET | Freq: Three times a day (TID) | ORAL | 0 refills | Status: DC | PRN

## 2024-07-25 NOTE — Progress Notes (Signed)
 [name] [phone]   [phone]  OFFICE PROGRESS NOTE  Benjamin Clam, MD [address] [place] [address] [place] [address]  DIAGNOSIS: Stage IV (T3, N3, M1 C) non-small cell lung cancer, adenocarcinoma presented with large right upper lobe lung mass in addition to right hilar and mediastinal as well as right supraclavicular lymphadenopathy and metastatic bone lesions including the left proximal humerus, L2 as well as sacral area and left adrenal metastasis diagnosed in June 2025.   Molecular studies show PDL1 of 0%   Biomarker Findings Blood Tumor Mutational Burden - 37 Muts/Mb ctDNA Tumor Fraction - High (2.4%) Microsatellite status - MSI-High Not Detected Genomic Findings For a complete list of the genes assayed, please refer to the Appendix. ARID1A E2250* KEAP1 G524C, G469fs*23 CTNNA1 splice site 2192+1G>C DNMT3A R882C TET2 K1197* TP53 splice site 813_993+[id number], deletion exons 8-9 This assay tested >300 cancer-related genes, including the following 8 gene(s) routinely assessed in this tumor type: ALK, BRAF, EGFR, ERBB2, KRAS, MET, RET, ROS1.     PRIOR THERAPY:  Palliative radiation to the painful bone lesion in the left humerus. Last day on 05/03/24.    CURRENT THERAPY:  Systemic chemotherapy with Carboplatin  for an AUC of 5, Alimta  500 mg/m2, and Libatyo IV every 3 weeks. First dose on 05/02/24. Doses of Alimta  reduced to 400 mg/m2 starting from cycle #2 due to HIV and neutropenia.  Status post 4 cycles.  Starting cycle #5 the patient is on treatment with maintenance Alimta  and Libtayo  (Cempilimab) every 3 weeks  INTERVAL HISTORY: Benjamin Herrera 58 y.o. male returns to the clinic today for follow-up visit.Discussed the use of AI scribe software for clinical note transcription with the patient, who gave verbal consent to proceed.  History of Present Illness Benjamin Herrera is a 58 year old male with stage four non-small cell  lung cancer who presents for evaluation before starting cycle number five of maintenance treatment.  Diagnosed with stage four non-small cell lung cancer, adenocarcinoma, in June 2025, with no actionable mutations. Currently undergoing maintenance treatment with Alimta  and Libtayo  every three weeks. Recent CT scan of the chest, abdomen, and pelvis was performed.  He has a history of HIV, managed by infectious disease specialists, with no current symptoms or issues related to HIV discussed during this visit.  Experienced nausea due to the ride to the clinic and stress from leaving his bag in the car and losing his phone. No other issues such as nausea, vomiting, diarrhea, headaches, chest pain, or breathing issues, except for some breathing difficulty when moving back to his bed.  Pain in the left arm persists, though he can move it slightly more now. Previously received radiation treatment for this issue and is scheduled to see an orthopedic surgeon for further evaluation.  Reports weight gain and mentions high blood pressure, which he attributes to stress. Recently received an injection for low white blood count, which improved his blood count.       MEDICAL HISTORY: Past Medical History:  Diagnosis Date   Achalasia and cardiospasm    Allergy    Anxiety    Arthritis    back ,shoulders    Asthma    Boils    under arms hx of   CAD (coronary artery disease)    a. 09/2016: cath showing 100% RCA occlusion with collaterals and 90% OM1 (treated with DES)   Candidiasis of mouth    Candidiasis of unspecified site  Cholelithiasis    Cocaine abuse (HCC)    Quit 09/2016   Condyloma acuminatum in male of scrotum & anal canal s/p laser ablation 07/03/2012   Coronary artery disease    Dysphagia    Fracture, humerus    Frozen shoulder    left   GERD (gastroesophageal reflux disease)    Heart attack (HCC)    Hemorrhoids    Hepatitis 1993   A   Herpes labialis    HIV (human  immunodeficiency virus infection) (HCC) DX 1993   Hypertension Dx 2014   off all bp meds for last 9 or 10 months   Pilonidal disease 01/10/2012   Polysubstance abuse (HCC)    Psoriasis    hx of   Sleep apnea    does  have CPAP   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  MEDICATIONS:  Current Outpatient Medications  Medication Sig Dispense Refill   acetaminophen  (TYLENOL ) 325 MG tablet Take 650 mg by mouth every 6 (six) hours as needed for mild pain (pain score 1-3), moderate pain (pain score 4-6) or headache.     albuterol  (VENTOLIN  HFA) 108 (90 Base) MCG/ACT inhaler Inhale 2 puffs into the lungs every 6 (six) hours as needed for wheezing or shortness of breath. 1 each 2   amLODipine  (NORVASC ) 5 MG tablet Take 1 tablet (5 mg total) by mouth daily. 90 tablet 1   amoxicillin -clavulanate (AUGMENTIN ) 875-125 MG tablet Take 1 tablet by mouth 2 (two) times daily. 20 tablet 0   aspirin  81 MG chewable tablet Chew 1 tablet (81 mg total) by mouth daily. 90 tablet 3   Bictegravir-Emtricitab-Tenofov (BIKTARVY ) 30-120-15 MG TABS Take 1 tablet by mouth daily. 90 tablet 3   Blood Pressure KIT 1 kit by Does not apply route daily. 1 kit 0   diclofenac  Sodium (VOLTAREN  ARTHRITIS PAIN) 1 % GEL Apply 2 g topically 4 (four) times daily. 100 g 3   folic acid  (FOLVITE ) 1 MG tablet Take 1 tablet (1 mg total) by mouth daily. Start 7 days before pemetrexed  chemotherapy. Continue until 21 days after pemetrexed  completed. 100 tablet 3   HYDROcodone -acetaminophen  (NORCO/VICODIN) 5-325 MG tablet Take 1 tablet by mouth every 6 (six) hours as needed for moderate pain (pain score 4-6).     hydrOXYzine  (ATARAX ) 25 MG tablet Take 1 tablet (25 mg total) by mouth 3 (three) times daily as needed. 30 tablet 0   lidocaine  (XYLOCAINE ) 2 % solution SWISH AND SWALLOW 5 MLS 3  (THREE) TIMES DAILY AS NEEDED FOR MOUTH PAIN. 360 mL 1   lidocaine -prilocaine  (EMLA ) cream Apply to affected area once 30 g 3   LORazepam  (ATIVAN ) 0.5 MG tablet 1 tab po 30 minutes prior to radiation or MRI scans 4 tablet 0   magic mouthwash (nystatin , diphenhydrAMINE , alum & mag hydroxide) suspension mixture Take 5 mLs by mouth 3 (three) times daily as needed for mouth pain. 240 mL 1   methocarbamol  (ROBAXIN ) 500 MG tablet Take 2 tablets (1,000 mg total) by mouth every 8 (eight) hours as needed. 90 tablet 1   metoprolol  succinate (TOPROL -XL) 25 MG 24 hr tablet TAKE 1/2 TABLET BY MOUTH DAILY 45 tablet 0   nitroGLYCERIN  (NITROSTAT ) 0.4 MG SL tablet Place 1 tablet (0.4 mg total) under the tongue every 5 (five) minutes as needed for chest pain (trouble  swallowing). 25 tablet 1   nystatin  (MYCOSTATIN ) 100000 UNIT/ML suspension Take by mouth.     ondansetron  (ZOFRAN ) 8 MG tablet Take 1 tablet (8 mg total) by mouth every 8 (eight) hours as needed for nausea or vomiting. Start on the third day after carboplatin . 30 tablet 1   oxyCODONE -acetaminophen  (PERCOCET/ROXICET) 5-325 MG tablet Take 1 tablet by mouth every 8 (eight) hours as needed for severe pain (pain score 7-10). 30 tablet 0   prochlorperazine  (COMPAZINE ) 10 MG tablet Take 1 tablet (10 mg total) by mouth every 6 (six) hours as needed for nausea or vomiting. 30 tablet 1   sulfamethoxazole -trimethoprim  (BACTRIM  DS) 800-160 MG tablet Take 1 tablet by mouth 3 (three) times a week. 36 tablet 3   No current facility-administered medications for this visit.    SURGICAL HISTORY:  Past Surgical History:  Procedure Laterality Date   BALLOON DILATION N/A 11/11/2015   Procedure: BALLOON DILATION;  Surgeon: Gustav Shila GAILS, MD;  Location: [facility];  Service: Endoscopy;  Laterality: N/A;   BOTOX  INJECTION  10/11/2012   Procedure: BOTOX  INJECTION;  Surgeon: Lamar JONETTA Aho, MD;  Location: [facility];  Service: Endoscopy;  Laterality: N/A;   BOTOX   INJECTION N/A 01/25/2013   Procedure: BOTOX  INJECTION;  Surgeon: Lamar JONETTA Aho, MD;  Location: [facility];  Service: Endoscopy;  Laterality: N/A;   BOTOX  INJECTION N/A 06/21/2013   Procedure: BOTOX  INJECTION;  Surgeon: Lamar JONETTA Aho, MD;  Location: [facility];  Service: Endoscopy;  Laterality: N/A;   BOTOX  INJECTION N/A 10/08/2013   Procedure: BOTOX  INJECTION;  Surgeon: Lamar JONETTA Aho, MD;  Location: [facility];  Service: Endoscopy;  Laterality: N/A;   BOTOX  INJECTION N/A 06/16/2015   Procedure: BOTOX  INJECTION;  Surgeon: Lamar JONETTA Aho, MD;  Location: [facility];  Service: Endoscopy;  Laterality: N/A;   BRONCHIAL BIOPSY  04/03/2024   Procedure: BRONCHOSCOPY, WITH BIOPSY;  Surgeon: Shelah Lamar RAMAN, MD;  Location: [facility];  Service: Pulmonary;;   BRONCHIAL BRUSHINGS  04/03/2024   Procedure: BRONCHOSCOPY, WITH BRUSH BIOPSY;  Surgeon: Shelah Lamar RAMAN, MD;  Location: [facility];  Service: Pulmonary;;   BRONCHIAL NEEDLE ASPIRATION BIOPSY  04/03/2024   Procedure: BRONCHOSCOPY, WITH NEEDLE ASPIRATION BIOPSY;  Surgeon: Shelah Lamar RAMAN, MD;  Location: [facility];  Service: Pulmonary;;   BRONCHOSCOPY, WITH BIOPSY USING ELECTROMAGNETIC NAVIGATION Right 04/03/2024   Procedure: BRONCHOSCOPY, WITH BIOPSY USING ELECTROMAGNETIC NAVIGATION;  Surgeon: Shelah Lamar RAMAN, MD;  Location: [facility];  Service: Pulmonary;  Laterality: Right;   CARDIAC CATHETERIZATION N/A 10/11/2016   Procedure: Left Heart Cath and Coronary Angiography;  Surgeon: Lonni JONETTA Cash, MD;  mRCA 100% w/ L>R collaterals, OM1 90%   CARDIAC CATHETERIZATION N/A 10/11/2016   Procedure: Coronary Stent Intervention;  Surgeon: Lonni JONETTA Cash, MD;  RESOLUTE ONYX 3.5X15 DES OM1   COLONOSCOPY  05/19/2012   Procedure: COLONOSCOPY;  Surgeon: Lamar JONETTA Aho, MD;  Location: [facility];  Service: Endoscopy;  Laterality: N/A;  jill trying to contact pt to come in 0830 for 930 case, phone not accepting messages   COLONOSCOPY      ESOPHAGEAL MANOMETRY  05/29/2012   Procedure: ESOPHAGEAL MANOMETRY (EM);  Surgeon: Lamar JONETTA Aho, MD;  Location: [facility];  Service: Endoscopy;  Laterality: N/A;   ESOPHAGEAL MANOMETRY N/A 10/06/2015   Procedure: ESOPHAGEAL MANOMETRY (EM);  Surgeon: Gustav Shila GAILS, MD;  Location: [facility];  Service: Endoscopy;  Laterality: N/A;   ESOPHAGOGASTRODUODENOSCOPY  11/11/2011   Procedure: ESOPHAGOGASTRODUODENOSCOPY (EGD);  Surgeon: Lamar JONETTA Aho, MD;  Location: [facility];  Service: Endoscopy;  Laterality: N/A;  botox  injection  called Pt to change time of procedure per Dr Debrah   ESOPHAGOGASTRODUODENOSCOPY  03/10/2012   Procedure: ESOPHAGOGASTRODUODENOSCOPY (EGD);  Surgeon: Lamar JONETTA Debrah, MD;  Location: [facility];  Service: Endoscopy;  Laterality: N/A;   ESOPHAGOGASTRODUODENOSCOPY  10/11/2012   Procedure: ESOPHAGOGASTRODUODENOSCOPY (EGD);  Surgeon: Lamar JONETTA Debrah, MD;  Location: [facility];  Service: Endoscopy;  Laterality: N/A;   ESOPHAGOGASTRODUODENOSCOPY N/A 01/25/2013   Procedure: ESOPHAGOGASTRODUODENOSCOPY (EGD);  Surgeon: Lamar JONETTA Debrah, MD;  Location: [facility];  Service: Endoscopy;  Laterality: N/A;   ESOPHAGOGASTRODUODENOSCOPY N/A 06/21/2013   Procedure: ESOPHAGOGASTRODUODENOSCOPY (EGD);  Surgeon: Lamar JONETTA Debrah, MD;  Location: [facility];  Service: Endoscopy;  Laterality: N/A;   ESOPHAGOGASTRODUODENOSCOPY (EGD) WITH PROPOFOL  N/A 10/08/2013   Procedure: ESOPHAGOGASTRODUODENOSCOPY (EGD) WITH PROPOFOL ;  Surgeon: Lamar JONETTA Debrah, MD;  Location: [facility];  Service: Endoscopy;  Laterality: N/A;   ESOPHAGOGASTRODUODENOSCOPY (EGD) WITH PROPOFOL  N/A 06/16/2015   Procedure: ESOPHAGOGASTRODUODENOSCOPY (EGD) WITH PROPOFOL ;  Surgeon: Lamar JONETTA Debrah, MD;  Location: [facility];  Service: Endoscopy;  Laterality: N/A;   ESOPHAGOGASTRODUODENOSCOPY (EGD) WITH PROPOFOL  N/A 11/11/2015   Procedure: ESOPHAGOGASTRODUODENOSCOPY (EGD) WITH PROPOFOL ;  Surgeon: Gustav Shila GAILS, MD;   Location: [facility];  Service: Endoscopy;  Laterality: N/A;   EXAMINATION UNDER ANESTHESIA  07/24/2012   Procedure: EXAM UNDER ANESTHESIA;  Surgeon: Elspeth KYM Schultze, MD;  Location: [facility];  Service: General;  Laterality: N/A;   humeral fracture surgery Right yrs ago   PILONIDAL CYST EXCISION  07/24/2012   Procedure: CYST EXCISION PILONIDAL SIMPLE;  Surgeon: Elspeth KYM Schultze, MD;  Location: [facility];  Service: General;  Laterality: N/A;  Exam Under Anesthesia,, Excision Pilonidal Disease,    right shoulder replacement  2008   x 2   UPPER GASTROINTESTINAL ENDOSCOPY     WART FULGURATION  07/24/2012   Procedure: FULGURATION ANAL WART;  Surgeon: Elspeth KYM Schultze, MD;  Location: [facility];  Service: General;  Laterality: N/A;  excision of raphe mass    REVIEW OF SYSTEMS:  Constitutional: positive for fatigue Eyes: negative Ears, nose, mouth, throat, and face: negative Respiratory: negative Cardiovascular: negative Gastrointestinal: positive for nausea Genitourinary:negative Integument/breast: negative Hematologic/lymphatic: negative Musculoskeletal:positive for bone pain Neurological: negative Behavioral/Psych: negative Endocrine: negative Allergic/Immunologic: negative   PHYSICAL EXAMINATION: General appearance: alert, cooperative, fatigued, and no distress Head: Normocephalic, without obvious abnormality, atraumatic Neck: no adenopathy, no JVD, supple, symmetrical, trachea midline, and thyroid  not enlarged, symmetric, no tenderness/mass/nodules Lymph nodes: Cervical, supraclavicular, and axillary nodes normal. Resp: clear to auscultation bilaterally Back: symmetric, no curvature. ROM normal. No CVA tenderness. Cardio: regular rate and rhythm, S1, S2 normal, no murmur, click, rub or gallop GI: soft, non-tender; bowel sounds normal; no masses,  no organomegaly Extremities: extremities normal, atraumatic, no cyanosis or edema Neurologic: Alert and oriented X 3, normal strength and tone. Normal  symmetric reflexes. Normal coordination and gait  ECOG PERFORMANCE STATUS: 1 - Symptomatic but completely ambulatory  Blood pressure (!) 144/92, pulse (!) 124, temperature 97.9 F (36.6 C), resp. rate 17, height 6' 1 (1.854 m), weight 196 lb 12.8 oz (89.3 kg), SpO2 97%.  LABORATORY DATA: Lab Results  Component Value Date   WBC 33.1 (H) 07/25/2024   HGB 9.5 (L) 07/25/2024   HCT 29.1 (L) 07/25/2024   MCV 81.1 07/25/2024   PLT 182 07/25/2024      Chemistry      Component Value Date/Time   NA 140 07/20/2024 1213   NA 136  06/19/2024 1445   K 3.8 07/20/2024 1213   CL 106 07/20/2024 1213   CO2 29 07/20/2024 1213   BUN 14 07/20/2024 1213   BUN 20 06/19/2024 1445   CREATININE 1.00 07/20/2024 1213   CREATININE 0.96 07/30/2020 1437      Component Value Date/Time   CALCIUM  9.3 07/20/2024 1213   ALKPHOS 59 07/20/2024 1213   AST 17 07/20/2024 1213   ALT 9 07/20/2024 1213   BILITOT 0.2 07/20/2024 1213       RADIOGRAPHIC STUDIES: CT CHEST ABDOMEN PELVIS W CONTRAST Result Date: 07/11/2024 CLINICAL DATA:  Metastatic lung cancer restaging, ongoing chemotherapy, XRT complete * Tracking Code: BO * EXAM: CT CHEST, ABDOMEN, AND PELVIS WITH CONTRAST CT LEFT HUMERUS WITH CONTRAST TECHNIQUE: Multidetector CT imaging of the chest, abdomen and pelvis was performed following the standard protocol during bolus administration of intravenous contrast. Multidetector CT imaging of the left humerus was performed following the standard protocol during bolus administration of intravenous contrast. RADIATION DOSE REDUCTION: This exam was performed according to the departmental dose-optimization program which includes automated exposure control, adjustment of the mA and/or kV according to patient size and/or use of iterative reconstruction technique. CONTRAST:  OMNIPAQUE  IOHEXOL  300 MG/ML  SOLN COMPARISON:  PET-CT, 04/09/2024, CT chest abdomen pelvis, 03/26/2024 FINDINGS: CT CHEST FINDINGS Cardiovascular:  Aortic atherosclerosis. Normal heart size. No pericardial effusion. Mediastinum/Nodes: Diminished size of a right hilar lymph node, measuring 1.4 x 1.3 cm, previously 2.7 x 2.0 cm (series 2, image 31). Treated, matted soft tissue about the right hilum without other discretely enlarged lymph nodes. Long segment circumferential wall thickening of the mid and lower esophagus (series 2, image 35). Thyroid  gland and trachea demonstrate no significant findings. Lungs/Pleura: Slight interval decrease in size of a spiculated pleural-based mass in the medial anterior right apex measuring 3.5 x 2.2 cm, previously 4.1 x 3.0 cm (series 7, image 38). Diffuse bilateral bronchial wall thickening, more conspicuous on the right. Mild, predominantly paraseptal emphysema. No pleural effusion or pneumothorax. Musculoskeletal: No chest wall abnormality. No acute osseous findings. CT ABDOMEN PELVIS FINDINGS Hepatobiliary: No solid liver abnormality is seen. Gallstone. No gallbladder wall thickening, or biliary dilatation. Pancreas: Unremarkable. No pancreatic ductal dilatation or surrounding inflammatory changes. Spleen: Normal in size without significant abnormality. Adrenals/Urinary Tract: Adrenal glands are unremarkable. Kidneys are normal, without renal calculi, solid lesion, or hydronephrosis. Bladder is unremarkable. Stomach/Bowel: Stomach is within normal limits. Appendix appears normal. No evidence of bowel wall thickening, distention, or inflammatory changes. Vascular/Lymphatic: Aortic atherosclerosis. No enlarged abdominal or pelvic lymph nodes. Reproductive: No mass or other abnormality. Other: No abdominal wall hernia or abnormality. No ascites. Musculoskeletal: No acute osseous findings. CT LEFT HUMERUS Osseous: Partial interval sclerotic healing of a lytic metastasis of the proximal left humerus with an associated subacute pathologic fracture. Musculoskeletal and Soft Tissues: Small lipoma within the left deltoid. Otherwise  unremarkable. IMPRESSION: 1. Slight interval decrease in size of a spiculated pleural-based mass in the medial anterior right apex. 2. Diminished size of a right hilar lymph node. Treated, matted soft tissue about the right hilum without other residual discretely enlarged lymph nodes. 3. Partial interval sclerotic healing of a lytic metastasis of the proximal left humerus with an associated subacute pathologic fracture. 4. No evidence of lymphadenopathy or metastatic disease in the abdomen or pelvis. 5. Long segment circumferential wall thickening of the mid and lower esophagus, consistent with nonspecific infectious or inflammatory esophagitis. 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 6. Emphysema  and diffuse bilateral bronchial wall thickening. 7. Cholelithiasis. Aortic Atherosclerosis (ICD10-I70.0) and Emphysema (ICD10-J43.9). Electronically Signed   By: Marolyn JONETTA Jaksch M.D.   On: 07/11/2024 19:01   CT Humerus Left W Contrast Result Date: 07/11/2024 CLINICAL DATA:  Metastatic lung cancer restaging, ongoing chemotherapy, XRT complete * Tracking Code: BO * EXAM: CT CHEST, ABDOMEN, AND PELVIS WITH CONTRAST CT LEFT HUMERUS WITH CONTRAST TECHNIQUE: Multidetector CT imaging of the chest, abdomen and pelvis was performed following the standard protocol during bolus administration of intravenous contrast. Multidetector CT imaging of the left humerus was performed following the standard protocol during bolus administration of intravenous contrast. RADIATION DOSE REDUCTION: This exam was performed according to the departmental dose-optimization program which includes automated exposure control, adjustment of the mA and/or kV according to patient size and/or use of iterative reconstruction technique. CONTRAST:  OMNIPAQUE  IOHEXOL  300 MG/ML  SOLN COMPARISON:  PET-CT, 04/09/2024, CT chest abdomen pelvis, 03/26/2024 FINDINGS: CT CHEST FINDINGS Cardiovascular: Aortic atherosclerosis. Normal  heart size. No pericardial effusion. Mediastinum/Nodes: Diminished size of a right hilar lymph node, measuring 1.4 x 1.3 cm, previously 2.7 x 2.0 cm (series 2, image 31). Treated, matted soft tissue about the right hilum without other discretely enlarged lymph nodes. Long segment circumferential wall thickening of the mid and lower esophagus (series 2, image 35). Thyroid  gland and trachea demonstrate no significant findings. Lungs/Pleura: Slight interval decrease in size of a spiculated pleural-based mass in the medial anterior right apex measuring 3.5 x 2.2 cm, previously 4.1 x 3.0 cm (series 7, image 38). Diffuse bilateral bronchial wall thickening, more conspicuous on the right. Mild, predominantly paraseptal emphysema. No pleural effusion or pneumothorax. Musculoskeletal: No chest wall abnormality. No acute osseous findings. CT ABDOMEN PELVIS FINDINGS Hepatobiliary: No solid liver abnormality is seen. Gallstone. No gallbladder wall thickening, or biliary dilatation. Pancreas: Unremarkable. No pancreatic ductal dilatation or surrounding inflammatory changes. Spleen: Normal in size without significant abnormality. Adrenals/Urinary Tract: Adrenal glands are unremarkable. Kidneys are normal, without renal calculi, solid lesion, or hydronephrosis. Bladder is unremarkable. Stomach/Bowel: Stomach is within normal limits. Appendix appears normal. No evidence of bowel wall thickening, distention, or inflammatory changes. Vascular/Lymphatic: Aortic atherosclerosis. No enlarged abdominal or pelvic lymph nodes. Reproductive: No mass or other abnormality. Other: No abdominal wall hernia or abnormality. No ascites. Musculoskeletal: No acute osseous findings. CT LEFT HUMERUS Osseous: Partial interval sclerotic healing of a lytic metastasis of the proximal left humerus with an associated subacute pathologic fracture. Musculoskeletal and Soft Tissues: Small lipoma within the left deltoid. Otherwise unremarkable. IMPRESSION: 1.  Slight interval decrease in size of a spiculated pleural-based mass in the medial anterior right apex. 2. Diminished size of a right hilar lymph node. Treated, matted soft tissue about the right hilum without other residual discretely enlarged lymph nodes. 3. Partial interval sclerotic healing of a lytic metastasis of the proximal left humerus with an associated subacute pathologic fracture. 4. No evidence of lymphadenopathy or metastatic disease in the abdomen or pelvis. 5. Long segment circumferential wall thickening of the mid and lower esophagus, consistent with nonspecific infectious or inflammatory esophagitis. 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 6. Emphysema and diffuse bilateral bronchial wall thickening. 7. Cholelithiasis. Aortic Atherosclerosis (ICD10-I70.0) and Emphysema (ICD10-J43.9). Electronically Signed   By: Marolyn JONETTA Jaksch M.D.   On: 07/11/2024 19:01    ASSESSMENT AND PLAN: This is a 58 years old African-American male with Stage IV (T3, N3, M1 C) non-small cell lung cancer, adenocarcinoma presented with large right upper lobe lung  mass in addition to right hilar and mediastinal as well as right supraclavicular lymphadenopathy and metastatic bone lesions including the left proximal humerus, L2 as well as sacral area and left adrenal metastasis diagnosed in June 2025. Molecular studies showed no actionable mutations and has negative PD-L1 expression but high TMB He is currently undergoing systemic chemotherapy with reduced dose carboplatin  for AUC of 4 and pemetrexed  400 Mg/M2 in addition to Libtayo  (Cempilimab) 350 Mg IV every 3 weeks.  Status post 4 cycles.  Starting cycle #5 he is on maintenance treatment with Alimta  and Libtayo  every 3 weeks. He is also on treatment with Xgeva  for bone metastasis. Assessment and Plan Assessment & Plan Stage IV lung adenocarcinoma Stage IV lung adenocarcinoma with no actionable mutations, currently on maintenance chemotherapy  with Alimta  and Libtayo . Recent CT scan of the chest, abdomen, and pelvis shows tumor shrinkage and improvement in disease status. Transitioning to a maintenance regimen with two drugs instead of three, which is expected to reduce side effects. - Administer Alimta  and Libtayo  as maintenance chemotherapy starting today.  Neutropenia Recent episode of neutropenia treated with growth factor injection, resulting in improved white blood cell count. No recent steroid use reported.  Left upper arm pain post-radiation Persistent left upper arm pain following radiation therapy. Orthopedic evaluation at [facility] scheduled to assess the need for further intervention, such as surgical nailing. - Attend orthopedic appointment at [facility] for further evaluation and management of left upper arm pain.  Essential hypertension Elevated blood pressure noted, likely related to stress from recent events. - Recheck blood pressure later today. The patient was advised to call immediately if he has any concerning symptoms in the interval. The patient voices understanding of current disease status and treatment options and is in agreement with the current care plan.  All questions were answered. The patient knows to call the clinic with any problems, questions or concerns. We can certainly see the patient much sooner if necessary.  The total time spent in the appointment was 30 minutes including review of chart and various tests results, discussions about plan of care and coordination of care plan .  Disclaimer: This note was dictated with voice recognition software. Similar sounding words can inadvertently be transcribed and may not be corrected upon review.

## 2024-07-25 NOTE — Progress Notes (Signed)
 Palliative Medicine [facility]  [phone] [phone]   Name: Benjamin Herrera Date: 07/25/2024 MRN: [id number]  DOB: 06-15-1966  Patient Care Team: [name], MD as PCP - General (Family Medicine) Eben Reyes [name], MD as PCP - Infectious Diseases (Infectious Diseases) Mona Vinie [name], MD as PCP - Cardiology (Cardiology) Debrah Lamar [name], MD (Inactive) as Consulting Physician (Gastroenterology) [name], MD as Consulting Physician (General Surgery) [name], RN as Oncology Nurse Navigator    INTERVAL HISTORY: Benjamin Herrera is a 58 y.o. male with medical problems include: HIV, hypertension, coronary artery disease (hx MI, status post DES to OM1 in 2017), chronic low back pain, SCC of skin, OSA, polysubstance abuse, GERD, asthma, anxiety, arthritis, more recently he was diagnosed with Adenocarcinoma of right lung, stage 4.   SOCIAL HISTORY:     reports that he has quit smoking. His smoking use included cigarettes. He started smoking about 42 years ago. He has a 42.7 pack-year smoking history. He has never used smokeless tobacco. He reports that he does not currently use drugs after having used the following drugs: Marijuana. Frequency: 3.00 times per week. He reports that he does not drink alcohol.  ADVANCE DIRECTIVES:  Full code full scope. Plans to meet with social work to establish advance directives. Referral to social work for this placed at initial visit. Pending completion of forms.   CODE STATUS: Full code  PAST MEDICAL HISTORY: Past Medical History:  Diagnosis Date   Achalasia and cardiospasm    Allergy    Anxiety    Arthritis    back ,shoulders    Asthma    Boils    under arms hx of   CAD (coronary artery disease)    a. 09/2016: cath showing 100% RCA occlusion with collaterals and 90% OM1 (treated with DES)   Candidiasis of mouth    Candidiasis of unspecified site    Cholelithiasis    Cocaine abuse  (HCC)    Quit 09/2016   Condyloma acuminatum in male of scrotum & anal canal s/p laser ablation 07/03/2012   Coronary artery disease    Dysphagia    Fracture, humerus    Frozen shoulder    left   GERD (gastroesophageal reflux disease)    Heart attack (HCC)    Hemorrhoids    Hepatitis 1993   A   Herpes labialis    HIV (human immunodeficiency virus infection) (HCC) DX 1993   Hypertension Dx 2014   off all bp meds for last 9 or 10 months   Pilonidal disease 01/10/2012   Polysubstance abuse (HCC)    Psoriasis    hx of   Sleep apnea    does  have CPAP   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  MEDICATIONS:  Current Outpatient Medications  Medication Sig Dispense Refill   acetaminophen  (TYLENOL ) 325 MG tablet Take 650 mg by mouth every 6 (six) hours as needed for mild pain (pain score 1-3), moderate pain (pain score 4-6) or headache.     albuterol  (VENTOLIN  HFA) 108 (90 Base) MCG/ACT inhaler Inhale 2 puffs into the lungs every 6 (six) hours as needed for wheezing or shortness of breath. 1 each 2   amLODipine  (NORVASC ) 5 MG tablet Take 1 tablet (5 mg total) by mouth daily. 90  tablet 1   amoxicillin -clavulanate (AUGMENTIN ) 875-125 MG tablet Take 1 tablet by mouth 2 (two) times daily. 20 tablet 0   aspirin  81 MG chewable tablet Chew 1 tablet (81 mg total) by mouth daily. 90 tablet 3   Bictegravir-Emtricitab-Tenofov (BIKTARVY ) 30-120-15 MG TABS Take 1 tablet by mouth daily. 90 tablet 3   Blood Pressure KIT 1 kit by Does not apply route daily. 1 kit 0   diclofenac  Sodium (VOLTAREN  ARTHRITIS PAIN) 1 % GEL Apply 2 g topically 4 (four) times daily. 100 g 3   folic acid  (FOLVITE ) 1 MG tablet Take 1 tablet (1 mg total) by mouth daily. Start 7 days before pemetrexed  chemotherapy. Continue until 21 days after pemetrexed  completed. 100  tablet 3   hydrOXYzine  (ATARAX ) 25 MG tablet Take 1 tablet (25 mg total) by mouth 3 (three) times daily as needed. 30 tablet 0   lidocaine  (XYLOCAINE ) 2 % solution SWISH AND SWALLOW 5 MLS 3 (THREE) TIMES DAILY AS NEEDED FOR MOUTH PAIN. 360 mL 1   lidocaine -prilocaine  (EMLA ) cream Apply to affected area once 30 g 3   LORazepam  (ATIVAN ) 0.5 MG tablet 1 tab po 30 minutes prior to radiation or MRI scans 4 tablet 0   magic mouthwash (nystatin , diphenhydrAMINE , alum & mag hydroxide) suspension mixture Take 5 mLs by mouth 3 (three) times daily as needed for mouth pain. 240 mL 1   methocarbamol  (ROBAXIN ) 500 MG tablet Take 2 tablets (1,000 mg total) by mouth every 8 (eight) hours as needed. 90 tablet 1   metoprolol  succinate (TOPROL -XL) 25 MG 24 hr tablet TAKE 1/2 TABLET BY MOUTH DAILY 45 tablet 0   nitroGLYCERIN  (NITROSTAT ) 0.4 MG SL tablet Place 1 tablet (0.4 mg total) under the tongue every 5 (five) minutes as needed for chest pain (trouble swallowing). 25 tablet 1   nystatin  (MYCOSTATIN ) 100000 UNIT/ML suspension Take by mouth.     ondansetron  (ZOFRAN ) 8 MG tablet Take 1 tablet (8 mg total) by mouth every 8 (eight) hours as needed for nausea or vomiting. Start on the third day after carboplatin . 30 tablet 1   oxyCODONE -acetaminophen  (PERCOCET/ROXICET) 5-325 MG tablet Take 1 tablet by mouth every 8 (eight) hours as needed for severe pain (pain score 7-10). 45 tablet 0   prochlorperazine  (COMPAZINE ) 10 MG tablet Take 1 tablet (10 mg total) by mouth every 6 (six) hours as needed for nausea or vomiting. 30 tablet 1   sulfamethoxazole -trimethoprim  (BACTRIM  DS) 800-160 MG tablet Take 1 tablet by mouth 3 (three) times a week. 36 tablet 3   No current facility-administered medications for this visit.   Facility-Administered Medications Ordered in Other Visits  Medication Dose Route Frequency Provider Last Rate Last Admin   0.9 %  sodium chloride  infusion   Intravenous Continuous Sherrod Sherrod, MD 10 mL/hr  at 07/25/24 1031 New Bag at 07/25/24 1031   PEMEtrexed  Disodium (ALIMTA ) 800 mg in sodium chloride  0.9 % 100 mL chemo infusion  400 mg/m2 (Treatment Plan Recorded) Intravenous Once Mohamed, Mohamed, MD 792 mL/hr at 07/25/24 1154 800 mg at 07/25/24 1154    VITAL SIGNS: There were no vitals taken for this visit. There were no vitals filed for this visit.  Estimated body mass index is 25.96 kg/m as calculated from the following:   Height as of an earlier encounter on 07/25/24: 6' 1 (1.854 m).   Weight as of an earlier encounter on 07/25/24: 196 lb 12.8 oz (89.3 kg).  PERFORMANCE STATUS (ECOG) : 1 - Symptomatic but completely ambulatory  Physical Exam General: NAD Cardiovascular: regular rate and rhythm Pulmonary: normal breathing pattern Extremities: no edema, no joint deformities Skin: no rashes Neurological: AAO x3  IMPRESSION: Discussed the use of AI scribe software for clinical note transcription with the patient, who gave verbal consent to proceed.  History of Present Illness KINGSLEE MAIRENA is a 58 year old male who presented to clinic for symptom management follow-up.  No acute distress.  No family present.  His energy levels fluctuate, with some days being better than others. He is able to walk around his house and engage in activities when not in pain. His sleep pattern remains irregular, often dictated by when he falls asleep watching TV.   The team had a difficult time finding IV access for treatment today. Patient states he feels that he may require port placement for better access.   States appetite fluctuates. Some days are better than others. Recently some improvement. Current weight is up to 196lbs. He is managing constipation with daily Senna use.   [place] reports he experiences persistent pain in his arm, particularly when moving, which starts at the top of the arm and radiates to the elbow. This pain occasionally causes involuntary arm movements when lying down. The pain  significantly impacts his ability to perform daily activities. He has an upcoming appointment with a surgeon to address the persistent pain and to discuss the possibility of a bone issue with hopes of ruling out any abnormalities.   His pain is being managed with use of oxycodone  as needed. UDS remains clear of significant illicit drugs outside of occasional marijuana use which he feels enhances his appetite. No changes to current regimen at this time.   We will continue to closely monitor and support.   Assessment & Plan Chronic arm pain  Chronic arm pain persists despite regimen, with pain radiating from the top of the arm to the elbow. The pain is exacerbated by movement and occasionally causes involuntary arm movements when lying down. He reports some improvement in movement but continues to experience discomfort. - Refer to surgeon for evaluation of chronic arm pain - Continue oxycodone  as needed.  - UDS every other visit. -Continue to monitor pain levels and adjust regimen as indicated  Constipation and diarrhea Alternating constipation and diarrhea. Currently taking a stool softener every other day, resulting in bowel movements approximately daily. Goal is to prevent more than two days without a bowel movement. - Increase stool softener to one pill daily - If bowel movements become regular, adjust to one pill every other day - Monitor bowel movement frequency and adjust stool softener dosage as needed  I will plan to see patient back in 2-3 weeks. Sooner if needed.   Patient expressed understanding and was in agreement with this plan. He also understands that He can call the clinic at any time with any questions, concerns, or complaints.   Any controlled substances utilized were prescribed in the context of palliative care. PDMP has been reviewed.   Visit consisted of counseling and education dealing with the complex and emotionally intense issues of symptom management and  palliative care in the setting of serious and potentially life-threatening illness.  Levon Borer, AGPCNP-BC  Palliative Medicine Team/[facility]

## 2024-07-25 NOTE — Patient Instructions (Signed)
 [facility] - A DEPT OF [facility]  Discharge Instructions: Thank you for choosing [facility] to provide your oncology and hematology care.   If you have a lab appointment with the Cancer Center, please go directly to the Cancer Center and check in at the registration area.   Wear comfortable clothing and clothing appropriate for easy access to any Portacath or PICC line.   We strive to give you quality time with your provider. You may need to reschedule your appointment if you arrive late (15 or more minutes).  Arriving late affects you and other patients whose appointments are after yours.  Also, if you miss three or more appointments without notifying the office, you may be dismissed from the clinic at the provider's discretion.      For prescription refill requests, have your pharmacy contact our office and allow 72 hours for refills to be completed.    Today you received the following chemotherapy and/or immunotherapy agents cimiplimab, pemetrexed       To help prevent nausea and vomiting after your treatment, we encourage you to take your nausea medication as directed.  BELOW ARE SYMPTOMS THAT SHOULD BE REPORTED IMMEDIATELY: *FEVER GREATER THAN 100.4 F (38 C) OR HIGHER *CHILLS OR SWEATING *NAUSEA AND VOMITING THAT IS NOT CONTROLLED WITH YOUR NAUSEA MEDICATION *UNUSUAL SHORTNESS OF BREATH *UNUSUAL BRUISING OR BLEEDING *URINARY PROBLEMS (pain or burning when urinating, or frequent urination) *BOWEL PROBLEMS (unusual diarrhea, constipation, pain near the anus) TENDERNESS IN MOUTH AND THROAT WITH OR WITHOUT PRESENCE OF ULCERS (sore throat, sores in mouth, or a toothache) UNUSUAL RASH, SWELLING OR PAIN  UNUSUAL VAGINAL DISCHARGE OR ITCHING   Items with * indicate a potential emergency and should be followed up as soon as possible or go to the Emergency Department if any problems should occur.  Please show the CHEMOTHERAPY ALERT CARD or  IMMUNOTHERAPY ALERT CARD at check-in to the Emergency Department and triage nurse.  Should you have questions after your visit or need to cancel or reschedule your appointment, please contact [facility] - A DEPT OF [facility]  Dept: [phone]  and follow the prompts.  Office hours are 8:00 a.m. to 4:30 p.m. Monday - Friday. Please note that voicemails left after 4:00 p.m. may not be returned until the following business day.  We are closed weekends and major holidays. You have access to a nurse at all times for urgent questions. Please call the main number to the clinic Dept: [phone] and follow the prompts.   For any non-urgent questions, you may also contact your provider using MyChart. We now offer e-Visits for anyone 50 and older to request care online for non-urgent symptoms. For details visit [email or web address].   Also download the MyChart app! Go to the app store, search MyChart, open the app, select [place], and log in with your MyChart username and password.

## 2024-07-26 DIAGNOSIS — M25512 Pain in left shoulder: Secondary | ICD-10-CM | POA: Diagnosis not present

## 2024-07-26 DIAGNOSIS — M899 Disorder of bone, unspecified: Secondary | ICD-10-CM | POA: Diagnosis not present

## 2024-07-26 DIAGNOSIS — C7951 Secondary malignant neoplasm of bone: Secondary | ICD-10-CM | POA: Diagnosis not present

## 2024-07-26 DIAGNOSIS — M84422D Pathological fracture, left humerus, subsequent encounter for fracture with routine healing: Secondary | ICD-10-CM | POA: Diagnosis not present

## 2024-07-26 DIAGNOSIS — G8929 Other chronic pain: Secondary | ICD-10-CM | POA: Diagnosis not present

## 2024-07-26 NOTE — Progress Notes (Signed)
 Orthopedic Established Patient Outpatient Note   History of Present Illness The patient is a very pleasant 58 year old male who presents today for evaluation of left arm pain.  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 Since then, he has been attending radiation therapy treatments regularly. He has since concluded those treatments.  He reports feeling fatigued and experiencing persistent pain in his left shoulder, although he notes an improvement in mobility. He has completed his radiation therapy and is currently undergoing chemotherapy, which he tolerates well. He is under the care of Dr. Gatha at [facility]. A CT scan of his shoulder was performed two weeks ago. He has gained weight and is on pain medication.    History   Medical History[1]  Surgical History[2]  Allergies[3]  Prior to Admission medications  Medication Sig Start Date End Date Taking? Authorizing Provider  acetaminophen  (TYLENOL ) 650 mg ER tablet Take  by mouth. 11/30/16  Yes HISTORICAL PROVIDER, CONVERSION  amLODIPine  (NORVASC ) 5 mg tablet Take  by mouth. 10/28/16  Yes HISTORICAL PROVIDER, CONVERSION  aspirin  81 mg chewable tablet CHEW 1 EVERY DAY 10/12/16  Yes HISTORICAL PROVIDER, CONVERSION  bictegrav-emtricit-tenofov ala (Biktarvy ) 30-120-15 mg tab Take 1 tablet by mouth Once Daily. 02/05/21 07/26/24 Yes HISTORICAL PROVIDER, CONVERSION  clopidogreL  (PLAVIX ) 75 mg tablet Take  by mouth. 10/21/16  Yes HISTORICAL PROVIDER, CONVERSION  cyclobenzaprine  (FLEXERIL ) 5 mg tablet TAKE 1 TABLET (5 MG TOTAL) BY MOUTH 3 (THREE) TIMES DAILY AS NEEDED FOR MUSCLE SPASMS. 08/03/16  Yes HISTORICAL PROVIDER, CONVERSION  darunavir  (Prezista ) 800 mg tablet Take  by mouth. 11/09/16  Yes HISTORICAL PROVIDER, CONVERSION  darunavir  (Prezista ) 800 mg tablet TAKE 1 TABLET BY MOUTH DAILY WITH BREAKFAST. CALL FOR JANUARY APPT. 08/06/19  Yes HISTORICAL PROVIDER,  CONVERSION  dolutegravir  (Tivicay ) 50 mg tab tablet Take 50 mg by mouth Once Daily. 04/24/13  Yes HISTORICAL PROVIDER, CONVERSION  elvitegravir-cobicistat-emtricitabine -tenofovir  ala (Genvoya ) 150-150-200-10 mg tab per tablet TAKE 1 TABLET BY MOUTH DAILY WITH BREAKFAST. 11/09/16  Yes HISTORICAL PROVIDER, CONVERSION  ezetimibe  (ZETIA ) 10 mg tablet Take 10 mg by mouth Once Daily. 03/02/21  Yes HISTORICAL PROVIDER, CONVERSION  ibuprofen  (MOTRIN ) 800 mg tablet TAKE 1 TAB BY MOUTH EVERY 8 HOURS AS NEEDED FOR MODERATE PAIN (OF SHOULDER. TAKE WITH FOOD). 10/31/19  Yes HISTORICAL PROVIDER, CONVERSION  metoprolol  succinate (TOPROL  XL) 25 mg 24 hr tablet TAKE 1/2 TABLET BY MOUTH EVERY DAY 04/26/19  Yes HISTORICAL PROVIDER, CONVERSION  mirtazapine  (REMERON ) 7.5 mg tablet Take  by mouth. 11/30/16  Yes HISTORICAL PROVIDER, CONVERSION  nitroglycerin  (NITROSTAT ) 0.4 mg SL tablet Place 0.4 mg under the tongue. 11/15/19  Yes HISTORICAL PROVIDER, CONVERSION  omeprazole  (PriLOSEC) 40 mg DR capsule TAKE 1 CAPSULE BY MOUTH DAILY BEFORE BREAKFAST. 05/25/16  Yes HISTORICAL PROVIDER, CONVERSION  rosuvastatin  (CRESTOR ) 40 mg tablet TAKE 1 TABLET BY MOUTH EVERY DAY *PT NEEDS APPT 02/14/19  Yes HISTORICAL PROVIDER, CONVERSION  Voltaren  1 % gel APPLY 2 G TOPICALLY 2 (TWO) TIMES DAILY. 07/02/16  Yes HISTORICAL PROVIDER, CONVERSION    Social History[4]  Family History[5]  Review of Symptoms   Review of Systems - Negative except pertinent data noted in the HPI.    Physical Examination   Heart Rate:  [96] 96 BP: (149)/(82) 149/82  Physical Exam General: Pleasant male, no acute distress.  Musculoskeletal: Left shoulder: Improved appearance, healing with new callus formation, nonweightbearing status advised.      I, Jameson Pollen, FNP-C,  have personally reviewed the radiologic imaging reports below and have correlated these images with patient's physical examination findings:  Imaging reviewed today reveals healing pathological fracture  and new ossification of proximal humerus. No acute fracture noted on exam today.  Radiology Results (last 30 days)     Procedure Component Value Units Date/Time   XR Humerus Left Min 2 Views [[id number]] Collected: 07/26/24 1332   Order Status: Completed Updated: 07/26/24 1335   Narrative:     X-RAY HUMERUS LEFT 1-2 VIEWS, 07/26/2024 1:04 PM  INDICATION: pain, Disorder of bone, unspecified \ M89.9 Disorder of bone, unspecified  pain COMPARISON: None.    Impression:     Healing pathologic fracture of the proximal humeral metadiaphysis with progressive callus formation. No new fracture or dislocation. Osteopenia.   CT Outside Images Non Result [[id number]] Resulted: 07/20/24 2047   Order Status: Completed Updated: 07/20/24 2047   Narrative:     These images were imported for review and or comparison.   CT Humerus Left W Contrast [[id number]] Resulted: 07/11/24 1901   Order Status: Completed Updated: 07/20/24 1608   Narrative:     CLINICAL DATA:  Metastatic lung cancer restaging, ongoing chemotherapy, XRT complete * Tracking Code: BO *  EXAM: CT CHEST, ABDOMEN, AND PELVIS WITH CONTRAST  CT LEFT HUMERUS WITH CONTRAST  TECHNIQUE: Multidetector CT imaging of the chest, abdomen and pelvis was performed following the standard protocol during bolus administration of intravenous contrast.  Multidetector CT imaging of the left humerus was performed following the standard protocol during bolus administration of intravenous contrast.  RADIATION DOSE REDUCTION: This exam was performed according to the departmental dose-optimization program which includes automated exposure control, adjustment of the mA and/or kV according to patient size and/or use of iterative reconstruction technique.  CONTRAST:  OMNIPAQUE  IOHEXOL  300 MG/ML  SOLN  COMPARISON:  PET-CT, 04/09/2024, CT chest abdomen pelvis, 03/26/2024  FINDINGS: CT CHEST FINDINGS  Cardiovascular: Aortic atherosclerosis.  Normal heart size. No pericardial effusion.  Mediastinum/Nodes: Diminished size of a right hilar lymph node, measuring 1.4 x 1.3 cm, previously 2.7 x 2.0 cm (series 2, image 31). Treated, matted soft tissue about the right hilum without other discretely enlarged lymph nodes. Long segment circumferential wall thickening of the mid and lower esophagus (series 2, image 35). Thyroid  gland and trachea demonstrate no significant findings.  Lungs/Pleura: Slight interval decrease in size of a spiculated pleural-based mass in the medial anterior right apex measuring 3.5 x 2.2 cm, previously 4.1 x 3.0 cm (series 7, image 38). Diffuse bilateral bronchial wall thickening, more conspicuous on the right. Mild, predominantly paraseptal emphysema. No pleural effusion or pneumothorax.  Musculoskeletal: No chest wall abnormality. No acute osseous findings.  CT ABDOMEN PELVIS FINDINGS  Hepatobiliary: No solid liver abnormality is seen. Gallstone. No gallbladder wall thickening, or biliary dilatation.  Pancreas: Unremarkable. No pancreatic ductal dilatation or surrounding inflammatory changes.  Spleen: Normal in size without significant abnormality.  Adrenals/Urinary Tract: Adrenal glands are unremarkable. Kidneys are normal, without renal calculi, solid lesion, or hydronephrosis. Bladder is unremarkable.  Stomach/Bowel: Stomach is within normal limits. Appendix appears normal. No evidence of bowel wall thickening, distention, or inflammatory changes.  Vascular/Lymphatic: Aortic atherosclerosis. No enlarged abdominal or pelvic lymph nodes.  Reproductive: No mass or other abnormality.  Other: No abdominal wall hernia or abnormality. No ascites.  Musculoskeletal: No acute osseous findings.  CT LEFT HUMERUS  Osseous: Partial interval sclerotic healing of a lytic metastasis of the proximal left humerus with an associated subacute pathologic fracture.  Musculoskeletal and Soft Tissues:  Small lipoma within the left deltoid. Otherwise unremarkable.  IMPRESSION: 1. Slight interval decrease in size of a spiculated pleural-based mass in the medial anterior right apex. 2. Diminished size of a right hilar lymph node. Treated, matted soft tissue about the right hilum without other residual discretely enlarged lymph nodes. 3. Partial interval sclerotic healing of a lytic metastasis of the proximal left humerus with an associated subacute pathologic fracture. 4. No evidence of lymphadenopathy or metastatic disease in the abdomen or pelvis. 5. Long segment circumferential wall thickening of the mid and lower esophagus, consistent with nonspecific infectious or inflammatory esophagitis. 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 6. Emphysema and diffuse bilateral bronchial wall thickening. 7. Cholelithiasis.  Aortic Atherosclerosis (ICD10-I70.0) and Emphysema (ICD10-J43.9).   Electronically Signed   By: Marolyn JONETTA Jaksch M.D.   On: 07/11/2024 19:01   CT Abdomen Pelvis W IV Only [[id number]] Resulted: 07/11/24 1901   Order Status: Completed Updated: 07/20/24 1608   Narrative:     CLINICAL DATA:  Metastatic lung cancer restaging, ongoing chemotherapy, XRT complete * Tracking Code: BO *  EXAM: CT CHEST, ABDOMEN, AND PELVIS WITH CONTRAST  CT LEFT HUMERUS WITH CONTRAST  TECHNIQUE: Multidetector CT imaging of the chest, abdomen and pelvis was performed following the standard protocol during bolus administration of intravenous contrast.  Multidetector CT imaging of the left humerus was performed following the standard protocol during bolus administration of intravenous contrast.  RADIATION DOSE REDUCTION: This exam was performed according to the departmental dose-optimization program which includes automated exposure control, adjustment of the mA and/or kV according to patient size and/or use of iterative reconstruction technique.  CONTRAST:   OMNIPAQUE  IOHEXOL  300 MG/ML  SOLN  COMPARISON:  PET-CT, 04/09/2024, CT chest abdomen pelvis, 03/26/2024  FINDINGS: CT CHEST FINDINGS  Cardiovascular: Aortic atherosclerosis. Normal heart size. No pericardial effusion.  Mediastinum/Nodes: Diminished size of a right hilar lymph node, measuring 1.4 x 1.3 cm, previously 2.7 x 2.0 cm (series 2, image 31). Treated, matted soft tissue about the right hilum without other discretely enlarged lymph nodes. Long segment circumferential wall thickening of the mid and lower esophagus (series 2, image 35). Thyroid  gland and trachea demonstrate no significant findings.  Lungs/Pleura: Slight interval decrease in size of a spiculated pleural-based mass in the medial anterior right apex measuring 3.5 x 2.2 cm, previously 4.1 x 3.0 cm (series 7, image 38). Diffuse bilateral bronchial wall thickening, more conspicuous on the right. Mild, predominantly paraseptal emphysema. No pleural effusion or pneumothorax.  Musculoskeletal: No chest wall abnormality. No acute osseous findings.  CT ABDOMEN PELVIS FINDINGS  Hepatobiliary: No solid liver abnormality is seen. Gallstone. No gallbladder wall thickening, or biliary dilatation.  Pancreas: Unremarkable. No pancreatic ductal dilatation or surrounding inflammatory changes.  Spleen: Normal in size without significant abnormality.  Adrenals/Urinary Tract: Adrenal glands are unremarkable. Kidneys are normal, without renal calculi, solid lesion, or hydronephrosis. Bladder is unremarkable.  Stomach/Bowel: Stomach is within normal limits. Appendix appears normal. No evidence of bowel wall thickening, distention, or inflammatory changes.  Vascular/Lymphatic: Aortic atherosclerosis. No enlarged abdominal or pelvic lymph nodes.  Reproductive: No mass or other abnormality.  Other: No abdominal wall hernia or abnormality. No ascites.  Musculoskeletal: No acute osseous findings.  CT LEFT  HUMERUS  Osseous: Partial interval sclerotic healing of a lytic metastasis of the proximal left humerus with an associated subacute pathologic fracture.  Musculoskeletal and Soft Tissues: Small lipoma within the left deltoid. Otherwise unremarkable.  IMPRESSION:  1. Slight interval decrease in size of a spiculated pleural-based mass in the medial anterior right apex. 2. Diminished size of a right hilar lymph node. Treated, matted soft tissue about the right hilum without other residual discretely enlarged lymph nodes. 3. Partial interval sclerotic healing of a lytic metastasis of the proximal left humerus with an associated subacute pathologic fracture. 4. No evidence of lymphadenopathy or metastatic disease in the abdomen or pelvis. 5. Long segment circumferential wall thickening of the mid and lower esophagus, consistent with nonspecific infectious or inflammatory esophagitis. 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 6. Emphysema and diffuse bilateral bronchial wall thickening. 7. Cholelithiasis.  Aortic Atherosclerosis (ICD10-I70.0) and Emphysema (ICD10-J43.9).   Electronically Signed   By: Marolyn JONETTA Jaksch M.D.   On: 07/11/2024 19:01        Assessment / Plan   Assessment & Plan 1. Left arm pain: The left arm pain is likely due to the healing process of the previously fractured bone. Imaging today shows evidence of ossification and new callus formation, indicating that the fracture is healing. He is advised to maintain non-weightbearing status and avoid lifting anything heavier than a coffee cup with the affected arm. Pain management will continue as per the current regimen. Repeat imaging will be conducted in a month to assess the progress of healing.  Pain management will continue with the current regimen, and additional pain medication will be prescribed if necessary. He should continue to avoid lifting anything heavier than a coffee cup with  the affected arm to prevent further injury. The importance of adhering to non-weightbearing status was emphasized to facilitate proper healing. Repeat imaging will be conducted in a month to assess the progress of healing. No surgical intervention is planned at this time. He will continue chemotherapy as scheduled, with upcoming infusions on 08/05/2024 and 08/15/2024. Follow-up will be scheduled a week after the second infusion, approximately a month and a week from today. The goal is to potentially remove the sling by the next visit, contingent on the healing progress.  Follow-up: 08/2024    Additional Considerations:  Tobacco Use: High Risk (07/26/2024)   Patient History   . Smoking Tobacco Use: Every Day   . Smokeless Tobacco Use: Never   . Passive Exposure: Not on file    BP Readings from Last 3 Encounters:  07/26/24 149/82  05/24/24 (!) 95/59  04/26/24 128/78    PHQ/GAD Scores   No data found in the last 10 encounters.      Orders This Encounter :  Orders Placed This Encounter  Procedures  . XR Humerus Left Min 2 Views    No orders of the defined types were placed in this encounter.    Jameson Sherre Pollen, NP   Electronically Signed by: Jameson Sherre Pollen, NP, Nurse Practitioner 07/26/2024 1:53 PM        [1] Past Medical History: Diagnosis Date  . Arthritis   . Asthma   . Dysphagia   . HIV (human immunodeficiency virus infection)    (CMD)   . Hyperlipemia   . MI (myocardial infarction)    (CMD)   . Sleep apnea    cpap machine  [2] Past Surgical History: Procedure Laterality Date  . CARDIAC SURGERY     Procedure: CARDIAC SURGERY; stent placed  10/07/16  . FINGER SURGERY Left 11/28/2019   Procedure: FINGER SURGERY; left thumb a-1 pulley  . SHOULDER ARTHROSCOPY     Procedure: SHOULDER ARTHROSCOPY  [3] Allergies Allergen Reactions  .  Citrus And Derivatives Swelling    Mouth itching and swelling   [4] Social History Socioeconomic History  . Marital status:  Single  Tobacco Use  . Smoking status: Every Day    Current packs/day: 0.50    Types: Cigarettes  . Smokeless tobacco: Never  . Tobacco comments:    half pack a days  Substance and Sexual Activity  . Alcohol use: Yes  . Drug use: Yes    Types: Marijuana   Social Drivers of Health   Food Insecurity: Food Insecurity Present (05/23/2024)   Received from [facility]   Food vital sign   . Within the past 12 months, you worried that your food would run out before you got money to buy more: Sometimes true   . Within the past 12 months, the food you bought just didn't last and you didn't have money to get more: Sometimes true  Transportation Needs: No Transportation Needs (04/16/2024)   Received from [facility] - Transportation   . In the past 12 months, has lack of transportation kept you from medical appointments or from getting medications?: No   . In the past 12 months, has lack of transportation kept you from meetings, work, or from getting things needed for daily living?: No  Safety: Not At Risk (04/16/2024)   Received from [facility]   Safety   . Within the last year, have you been afraid of your partner or ex-partner?: No   . Within the last year, have you been humiliated or emotionally abused in other ways by your partner or ex-partner?: No   . Within the last year, have you been kicked, hit, slapped, or otherwise physically hurt by your partner or ex-partner?: No   . Within the last year, have you been raped or forced to have any kind of sexual activity by your partner or ex-partner?: No  Living Situation: Unknown (04/16/2024)   Received from [facility] Situation   . In the last 12 months, was there a time when you were not able to pay the mortgage or rent on time?: No   . At any time in the past 12 months, were you homeless or living in a shelter (including now)?: No  [5] Family History Problem Relation Name Age of Onset  . Arthritis Mother    . Heart  disease Mother

## 2024-07-27 ENCOUNTER — Ambulatory Visit (HOSPITAL_COMMUNITY)
Admission: RE | Admit: 2024-07-27 | Discharge: 2024-07-27 | Disposition: A | Discharge status: Home / Self Care | Source: Ambulatory Visit | Attending: Physician Assistant | Admitting: Physician Assistant

## 2024-07-27 ENCOUNTER — Telehealth: Payer: Self-pay | Admitting: Physician Assistant

## 2024-07-27 DIAGNOSIS — I809 Phlebitis and thrombophlebitis of unspecified site: Secondary | ICD-10-CM | POA: Insufficient documentation

## 2024-07-27 DIAGNOSIS — I808 Phlebitis and thrombophlebitis of other sites: Secondary | ICD-10-CM | POA: Diagnosis not present

## 2024-07-27 NOTE — Telephone Encounter (Signed)
 I called the patient to review his upper extremity ultrasound. No evidence of DVT. acute superficial vein thrombosis noted due to chemotherapy. We discussed management. We revisited a port but he is not interested at this time.

## 2024-08-10 NOTE — Progress Notes (Unsigned)
 [facility] OFFICE PROGRESS NOTE  Benjamin Clam, MD [address] [place] [address] [place] [address]  DIAGNOSIS: Stage IV (T3, N3, M1 C) non-small cell lung cancer, adenocarcinoma presented with large right upper lobe lung mass in addition to right hilar and mediastinal as well as right supraclavicular lymphadenopathy and metastatic bone lesions including the left proximal humerus, L2 as well as sacral area and left adrenal metastasis diagnosed in June 2025.   Molecular studies show PDL1 of 0%   Biomarker Findings Blood Tumor Mutational Burden - 37 Muts/Mb ctDNA Tumor Fraction - High (2.4%) Microsatellite status - MSI-High Not Detected Genomic Findings For a complete list of the genes assayed, please refer to the Appendix. ARID1A E2250* KEAP1 G524C, G457fs*23 CTNNA1 splice site 2192+1G>C DNMT3A R882C TET2 K1197* TP53 splice site 813_993+[id number], deletion exons 8-9 This assay tested >300 cancer-related genes, including the following 8 gene(s) routinely assessed in this tumor type: ALK, BRAF, EGFR, ERBB2, KRAS, MET, RET, ROS1.  PRIOR THERAPY: 1) Palliative radiation to the painful bone lesion in the left humerus. Last day on 05/03/24.  2) SRS to the brain lesion, completed on on 06/20/24  CURRENT THERAPY:  Systemic chemotherapy with Carboplatin  for an AUC of 5, Alimta  500 mg/m2, and Libatyo IV every 3 weeks. First dose on 05/02/24. Doses of Alimta  reduced to 400 mg/m2 starting from cycle #2 due to HIV and neutropenia.  Starting from cycle #5 the patient will be on maintenance Alimta  and Libtayo .  Status post 5 cycles. Alimta  discontinued from cycle #6 due to cytopenias  INTERVAL HISTORY: Benjamin Herrera 58 y.o. male returns to the clinic today for a follow-up visit.  The patient was last seen in the clinic by Dr. Sherrod on 07/25/2024.  The patient completed 4 cycles of chemotherapy and immunotherapy.  Starting from cycle #5, his last cycle of treatment, the patient  started maintenance chemotherapy with Alimta  and immunotherapy with Libtayo .  He tolerated this fair except he had more nausea without vomiting with the adjustments in his premeds with starting maintenance.   The patient has thrombophlebitis from his chemotherapy.  The patient is not interested in a Port-A-Cath at this time.  His phlebitis has improved but is persistent. His DVT study was negative.   He has compazine  and zofran  at home.   He experiences pain in his arm, exacerbated by movement, and uses a sling for support. He recently slipped off his bed, increasing his arm pain, but did not sustain any head injury. He is scheduled to see ortho tomorrow but he is thinking of cancelling the appointment because he is worried he will be too tired.  His appetite is variable, describing it as 'up and down,' and he acknowledges some weight loss. He has boost but has not been drinking it regularly. He drinks a lot of water.   He experiences light headaches every other day, located in the front of his head. He uses aspirin . He has not used Tylenol . No vision or speech changes are associated with the headaches.  No fever, chills, night sweats, vomiting, diarrhea, constipation, or cough. His breathing is fine, though he needs to rest after walking a distance. His eyes and nose water, which he attributes to chemotherapy.  He is currently taking pain medication managed by palliative Care and is scheduled to see him today. He has previously required blood transfusions, and recent lab results show a decrease in his blood count, though he does not currently need a transfusion. He recalls that  shots to boost his white blood cells have caused significant back pain in the past.  He is here today for evaluation and repeat blood work before undergoing cycle #6.  MEDICAL HISTORY: Past Medical History:  Diagnosis Date   Achalasia and cardiospasm    Allergy    Anxiety    Arthritis    back ,shoulders    Asthma     Boils    under arms hx of   CAD (coronary artery disease)    a. 09/2016: cath showing 100% RCA occlusion with collaterals and 90% OM1 (treated with DES)   Candidiasis of mouth    Candidiasis of unspecified site    Cholelithiasis    Cocaine abuse (HCC)    Quit 09/2016   Condyloma acuminatum in male of scrotum & anal canal s/p laser ablation 07/03/2012   Coronary artery disease    Dysphagia    Fracture, humerus    Frozen shoulder    left   GERD (gastroesophageal reflux disease)    Heart attack (HCC)    Hemorrhoids    Hepatitis 1993   A   Herpes labialis    HIV (human immunodeficiency virus infection) (HCC) DX 1993   Hypertension Dx 2014   off all bp meds for last 9 or 10 months   Pilonidal disease 01/10/2012   Polysubstance abuse (HCC)    Psoriasis    hx of   Sleep apnea    does  have CPAP   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  MEDICATIONS:  Current Outpatient Medications  Medication Sig Dispense Refill   acetaminophen  (TYLENOL ) 325 MG tablet Take 650 mg by mouth every 6 (six) hours as needed for mild pain (pain score 1-3), moderate pain (pain score 4-6) or headache.     albuterol  (VENTOLIN  HFA) 108 (90 Base) MCG/ACT inhaler Inhale 2 puffs into the lungs every 6 (six) hours as needed for wheezing or shortness of breath. 1 each 2   amLODipine  (NORVASC ) 5 MG tablet Take 1 tablet (5 mg total) by mouth daily. 90 tablet 1   amoxicillin -clavulanate (AUGMENTIN ) 875-125 MG tablet Take 1 tablet by mouth 2 (two) times daily. 20 tablet 0   aspirin  81 MG chewable tablet Chew 1 tablet (81 mg total) by mouth daily. 90 tablet 3   Bictegravir-Emtricitab-Tenofov (BIKTARVY ) 30-120-15 MG TABS Take 1 tablet by mouth daily. 90 tablet 3   Blood Pressure KIT 1 kit by Does not apply route daily. 1 kit 0   diclofenac  Sodium (VOLTAREN   ARTHRITIS PAIN) 1 % GEL Apply 2 g topically 4 (four) times daily. 100 g 3   folic acid  (FOLVITE ) 1 MG tablet Take 1 tablet (1 mg total) by mouth daily. Start 7 days before pemetrexed  chemotherapy. Continue until 21 days after pemetrexed  completed. 30 tablet 3   hydrOXYzine  (ATARAX ) 25 MG tablet Take 1 tablet (25 mg total) by mouth 3 (three) times daily as needed. 30 tablet 0   lidocaine  (XYLOCAINE ) 2 % solution SWISH AND SWALLOW 5 MLS 3 (THREE) TIMES DAILY AS NEEDED FOR MOUTH PAIN. 360 mL 1   lidocaine -prilocaine  (EMLA ) cream Apply to affected area once 30 g 3   LORazepam  (ATIVAN ) 0.5 MG tablet 1 tab po 30 minutes prior to radiation or MRI scans 4 tablet 0   magic mouthwash (nystatin , diphenhydrAMINE , alum & mag hydroxide)  suspension mixture Take 5 mLs by mouth 3 (three) times daily as needed for mouth pain. 240 mL 1   methocarbamol  (ROBAXIN ) 500 MG tablet Take 2 tablets (1,000 mg total) by mouth every 8 (eight) hours as needed. 90 tablet 1   metoprolol  succinate (TOPROL -XL) 25 MG 24 hr tablet TAKE 1/2 TABLET BY MOUTH DAILY 45 tablet 0   nitroGLYCERIN  (NITROSTAT ) 0.4 MG SL tablet Place 1 tablet (0.4 mg total) under the tongue every 5 (five) minutes as needed for chest pain (trouble swallowing). 25 tablet 1   nystatin  (MYCOSTATIN ) 100000 UNIT/ML suspension Take by mouth.     ondansetron  (ZOFRAN ) 8 MG tablet Take 1 tablet (8 mg total) by mouth every 8 (eight) hours as needed for nausea or vomiting. Start on the third day after carboplatin . 30 tablet 1   oxyCODONE -acetaminophen  (PERCOCET/ROXICET) 5-325 MG tablet Take 1 tablet by mouth every 8 (eight) hours as needed for severe pain (pain score 7-10). 45 tablet 0   prochlorperazine  (COMPAZINE ) 10 MG tablet Take 1 tablet (10 mg total) by mouth every 6 (six) hours as needed for nausea or vomiting. 30 tablet 1   sulfamethoxazole -trimethoprim  (BACTRIM  DS) 800-160 MG tablet Take 1 tablet by mouth 3 (three) times a week. 36 tablet 3   No current  facility-administered medications for this visit.    SURGICAL HISTORY:  Past Surgical History:  Procedure Laterality Date   BALLOON DILATION N/A 11/11/2015   Procedure: BALLOON DILATION;  Surgeon: Gustav Shila GAILS, MD;  Location: [facility];  Service: Endoscopy;  Laterality: N/A;   BOTOX  INJECTION  10/11/2012   Procedure: BOTOX  INJECTION;  Surgeon: Lamar JONETTA Aho, MD;  Location: [facility];  Service: Endoscopy;  Laterality: N/A;   BOTOX  INJECTION N/A 01/25/2013   Procedure: BOTOX  INJECTION;  Surgeon: Lamar JONETTA Aho, MD;  Location: [facility];  Service: Endoscopy;  Laterality: N/A;   BOTOX  INJECTION N/A 06/21/2013   Procedure: BOTOX  INJECTION;  Surgeon: Lamar JONETTA Aho, MD;  Location: [facility];  Service: Endoscopy;  Laterality: N/A;   BOTOX  INJECTION N/A 10/08/2013   Procedure: BOTOX  INJECTION;  Surgeon: Lamar JONETTA Aho, MD;  Location: [facility];  Service: Endoscopy;  Laterality: N/A;   BOTOX  INJECTION N/A 06/16/2015   Procedure: BOTOX  INJECTION;  Surgeon: Lamar JONETTA Aho, MD;  Location: [facility];  Service: Endoscopy;  Laterality: N/A;   BRONCHIAL BIOPSY  04/03/2024   Procedure: BRONCHOSCOPY, WITH BIOPSY;  Surgeon: Shelah Lamar RAMAN, MD;  Location: [facility];  Service: Pulmonary;;   BRONCHIAL BRUSHINGS  04/03/2024   Procedure: BRONCHOSCOPY, WITH BRUSH BIOPSY;  Surgeon: Shelah Lamar RAMAN, MD;  Location: [facility];  Service: Pulmonary;;   BRONCHIAL NEEDLE ASPIRATION BIOPSY  04/03/2024   Procedure: BRONCHOSCOPY, WITH NEEDLE ASPIRATION BIOPSY;  Surgeon: Shelah Lamar RAMAN, MD;  Location: [facility];  Service: Pulmonary;;   BRONCHOSCOPY, WITH BIOPSY USING ELECTROMAGNETIC NAVIGATION Right 04/03/2024   Procedure: BRONCHOSCOPY, WITH BIOPSY USING ELECTROMAGNETIC NAVIGATION;  Surgeon: Shelah Lamar RAMAN, MD;  Location: [facility];  Service: Pulmonary;  Laterality: Right;   CARDIAC CATHETERIZATION N/A 10/11/2016   Procedure: Left Heart Cath and Coronary Angiography;  Surgeon: Lonni JONETTA Cash, MD;  mRCA 100% w/ L>R collaterals, OM1 90%   CARDIAC CATHETERIZATION N/A 10/11/2016   Procedure: Coronary Stent Intervention;  Surgeon: Lonni JONETTA Cash, MD;  RESOLUTE ONYX 3.5X15 DES OM1   COLONOSCOPY  05/19/2012   Procedure: COLONOSCOPY;  Surgeon: Lamar JONETTA Aho, MD;  Location: [facility];  Service: Endoscopy;  Laterality: N/A;  jill trying to contact  pt to come in 0830 for 930 case, phone not accepting messages   COLONOSCOPY     ESOPHAGEAL MANOMETRY  05/29/2012   Procedure: ESOPHAGEAL MANOMETRY (EM);  Surgeon: Lamar JONETTA Aho, MD;  Location: [facility];  Service: Endoscopy;  Laterality: N/A;   ESOPHAGEAL MANOMETRY N/A 10/06/2015   Procedure: ESOPHAGEAL MANOMETRY (EM);  Surgeon: Gustav Shila GAILS, MD;  Location: [facility];  Service: Endoscopy;  Laterality: N/A;   ESOPHAGOGASTRODUODENOSCOPY  11/11/2011   Procedure: ESOPHAGOGASTRODUODENOSCOPY (EGD);  Surgeon: Lamar JONETTA Aho, MD;  Location: [facility];  Service: Endoscopy;  Laterality: N/A;  botox  injection  called Pt to change time of procedure per Dr Aho   ESOPHAGOGASTRODUODENOSCOPY  03/10/2012   Procedure: ESOPHAGOGASTRODUODENOSCOPY (EGD);  Surgeon: Lamar JONETTA Aho, MD;  Location: [facility];  Service: Endoscopy;  Laterality: N/A;   ESOPHAGOGASTRODUODENOSCOPY  10/11/2012   Procedure: ESOPHAGOGASTRODUODENOSCOPY (EGD);  Surgeon: Lamar JONETTA Aho, MD;  Location: [facility];  Service: Endoscopy;  Laterality: N/A;   ESOPHAGOGASTRODUODENOSCOPY N/A 01/25/2013   Procedure: ESOPHAGOGASTRODUODENOSCOPY (EGD);  Surgeon: Lamar JONETTA Aho, MD;  Location: [facility];  Service: Endoscopy;  Laterality: N/A;   ESOPHAGOGASTRODUODENOSCOPY N/A 06/21/2013   Procedure: ESOPHAGOGASTRODUODENOSCOPY (EGD);  Surgeon: Lamar JONETTA Aho, MD;  Location: [facility];  Service: Endoscopy;  Laterality: N/A;   ESOPHAGOGASTRODUODENOSCOPY (EGD) WITH PROPOFOL  N/A 10/08/2013   Procedure: ESOPHAGOGASTRODUODENOSCOPY (EGD) WITH PROPOFOL ;  Surgeon: Lamar JONETTA Aho, MD;  Location: [facility];  Service: Endoscopy;  Laterality: N/A;   ESOPHAGOGASTRODUODENOSCOPY (EGD) WITH PROPOFOL  N/A 06/16/2015   Procedure: ESOPHAGOGASTRODUODENOSCOPY (EGD) WITH PROPOFOL ;  Surgeon: Lamar JONETTA Aho, MD;  Location: [facility];  Service: Endoscopy;  Laterality: N/A;   ESOPHAGOGASTRODUODENOSCOPY (EGD) WITH PROPOFOL  N/A 11/11/2015   Procedure: ESOPHAGOGASTRODUODENOSCOPY (EGD) WITH PROPOFOL ;  Surgeon: Gustav Shila GAILS, MD;  Location: [facility];  Service: Endoscopy;  Laterality: N/A;   EXAMINATION UNDER ANESTHESIA  07/24/2012   Procedure: EXAM UNDER ANESTHESIA;  Surgeon: Elspeth KYM Schultze, MD;  Location: [facility];  Service: General;  Laterality: N/A;   humeral fracture surgery Right yrs ago   PILONIDAL CYST EXCISION  07/24/2012   Procedure: CYST EXCISION PILONIDAL SIMPLE;  Surgeon: Elspeth KYM Schultze, MD;  Location: [facility];  Service: General;  Laterality: N/A;  Exam Under Anesthesia,, Excision Pilonidal Disease,    right shoulder replacement  2008   x 2   UPPER GASTROINTESTINAL ENDOSCOPY     WART FULGURATION  07/24/2012   Procedure: FULGURATION ANAL WART;  Surgeon: Elspeth KYM Schultze, MD;  Location: [facility];  Service: General;  Laterality: N/A;  excision of raphe mass    REVIEW OF SYSTEMS:   Review of Systems  Constitutional: Positive for fatigue, appetite change, and weight loss. Negative for chills and fever.  HENT: Negative for mouth sores, nosebleeds, sore throat and trouble swallowing.   Eyes: Negative for eye problems and icterus.  Respiratory: Negative for cough, hemoptysis, shortness of breath and wheezing.   Cardiovascular: Negative for chest pain and leg swelling.  Gastrointestinal: Negative for abdominal pain, constipation, diarrhea, nausea and vomiting.  Genitourinary: Negative for bladder incontinence, difficulty urinating, dysuria, frequency and hematuria.   Musculoskeletal: Positive for left arm pain and decreased ROM. Negative for gait problem, neck pain and  neck stiffness.  Skin: Positive for skin darkening and subcutaneous knots along the vessel in the right upper extremity.   Neurological: Positive for headaches. Negative for dizziness, extremity weakness, gait problem, light-headedness and seizures.  Hematological: Negative for adenopathy. Does not bruise/bleed easily.  Psychiatric/Behavioral: Negative for confusion, depression and sleep disturbance.  The patient is not nervous/anxious.    PHYSICAL EXAMINATION:  Blood pressure (!) 142/78, pulse 90, temperature 98.3 F (36.8 C), temperature source Temporal, resp. rate 16, weight 187 lb 12.8 oz (85.2 kg), SpO2 100%.  ECOG PERFORMANCE STATUS: 1  Physical Exam  Constitutional: Oriented to person, place, and time and well-developed, well-nourished, and in no distress.  HENT:  Head: Normocephalic and atraumatic.  Mouth/Throat: Oropharynx is clear and moist. No oropharyngeal exudate.  Eyes: Conjunctivae are normal. Right eye exhibits no discharge. Left eye exhibits no discharge. No scleral icterus.  Neck: Normal range of motion. Neck supple.  Cardiovascular: Normal rate, regular rhythm, normal heart sounds and intact distal pulses.   Pulmonary/Chest: Effort normal and breath sounds normal. No respiratory distress. No wheezes. No rales.  Abdominal: Soft. Bowel sounds are normal. Exhibits no distension and no mass. There is no tenderness.  Musculoskeletal: Left arm in sling. Exhibits no edema.  Lymphadenopathy:    No cervical adenopathy.  Neurological: Alert and oriented to person, place, and time. Exhibits normal muscle tone. Gait normal. Coordination normal.  Skin: Skin is warm and dry. Skin darkening and firmness along vein in right upper extremity from prior chemotherapy site. No warmth or swelling. Area is tender to touch. No rash noted. Not diaphoretic. No erythema. No pallor.  Psychiatric: Mood, memory and judgment normal.  Vitals reviewed.  LABORATORY DATA: Lab Results  Component Value  Date   WBC 4.3 08/15/2024   HGB 8.3 (L) 08/15/2024   HCT 25.1 (L) 08/15/2024   MCV 81.2 08/15/2024   PLT 357 08/15/2024      Chemistry      Component Value Date/Time   NA 140 08/15/2024 0841   NA 136 06/19/2024 1445   K 3.3 (L) 08/15/2024 0841   CL 108 08/15/2024 0841   CO2 24 08/15/2024 0841   BUN 14 08/15/2024 0841   BUN 20 06/19/2024 1445   CREATININE 1.16 08/15/2024 0841   CREATININE 0.96 07/30/2020 1437      Component Value Date/Time   CALCIUM  9.6 08/15/2024 0841   ALKPHOS 64 08/15/2024 0841   AST 19 08/15/2024 0841   ALT 10 08/15/2024 0841   BILITOT 0.3 08/15/2024 0841       RADIOGRAPHIC STUDIES:  VAS US  UPPER EXTREMITY VENOUS DUPLEX Result Date: 07/30/2024 UPPER VENOUS STUDY  Patient Name:  BRAVERY KETCHAM  Date of Exam:   07/27/2024 Medical Rec #: [id number]       Accession #:    [id number] Date of Birth: 04-30-1966       Patient Gender: M Patient Age:   58 years Exam Location:  [address] Procedure:      VAS US  UPPER EXTREMITY VENOUS DUPLEX Referring Phys: CALTON DIENER --------------------------------------------------------------------------------  Indications: Swelling, and Pain Risk Factors: Cancer Chemo. Comparison Study: None. Performing Technologist: Garnette Rockers  Examination Guidelines: A complete evaluation includes B-mode imaging, spectral Doppler, color Doppler, and power Doppler as needed of all accessible portions of each vessel. Bilateral testing is considered an integral part of a complete examination. Limited examinations for reoccurring indications may be performed as noted.  Right Findings: +----------+------------+---------+-----------+----------+-------+ RIGHT     CompressiblePhasicitySpontaneousPropertiesSummary +----------+------------+---------+-----------+----------+-------+ IJV           Full       Yes       Yes                      +----------+------------+---------+-----------+----------+-------+ Subclavian    Full  Yes       Yes                      +----------+------------+---------+-----------+----------+-------+ Axillary      Full       Yes       Yes                      +----------+------------+---------+-----------+----------+-------+ Brachial      Full       Yes       Yes                      +----------+------------+---------+-----------+----------+-------+ Radial        Full       Yes       Yes                      +----------+------------+---------+-----------+----------+-------+ Ulnar         Full       Yes       Yes                      +----------+------------+---------+-----------+----------+-------+ Cephalic      Full                 Yes                      +----------+------------+---------+-----------+----------+-------+ Basilic       None       No        No      dilated   Acute  +----------+------------+---------+-----------+----------+-------+ Branching vein off of Cephalic V in the mid forearm occluded with acute thrombosis.  Left Findings: +----------+------------+---------+-----------+----------+-------+ LEFT      CompressiblePhasicitySpontaneousPropertiesSummary +----------+------------+---------+-----------+----------+-------+ Subclavian               Yes       Yes                      +----------+------------+---------+-----------+----------+-------+  Summary:  Right: No evidence of deep vein thrombosis in the upper extremity. Findings consistent with acute superficial vein thrombosis involving the right basilic vein. Branching vein off of Cephalic V in the mid forearm occluded with acute thrombosis.  Left: No evidence of thrombosis in the subclavian.  *See table(s) above for measurements and observations.  Diagnosing physician: Gaile New MD Electronically signed by Gaile New MD on 07/30/2024 at 8:23:09 AM.    Final      ASSESSMENT/PLAN:  This is a very pleasant 58 years old African-American male recently diagnosed with a stage  IV (T3, N3, M1 C) non-small cell lung cancer, adenocarcinoma presented with large right upper lobe lung mass in addition to right hilar and mediastinal as well as right supraclavicular lymphadenopathy and metastatic bone lesions including the left proximal humerus, L2 as well as sacral area and left adrenal metastasis diagnosed in June 2025.   His molecular studies show no actionable mutations. He has high TMB. 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   The patient is currently on chemotherapy at a reduced dose with carboplatin  for an AUC of 4 and Alimta  400 mg/m, and Libtayo  IV every 3 weeks.  He is status post 5 cycles of treatment.  Starting from cycle #5 the patient started maintenance treatment with Alimta  400 mg/m and Libtayo  IV every 3 weeks.    Labs were reviewed.  His total WBC is  4.3 and his ANC is 1.3. The patient has HIV. I reviewed with Dr. Sherrod. Dr. Sherrod recommends discontinuing his alimta  due to his anemia and neutropenia.   I have removed it from the care plan today. The patient is seeing Dr. Sherrod at his next appointment and can adjust the treatment plan moving for future cycles.   He will proceed with cycle number 6 today as scheduled with single agent libtayo .  Still monitor his lab work weekly to ensure he does not have worsening anemia which may require blood transfusion.  Will consider him for blood transfusion if his hemoglobin were less than 8.  I have standing orders for sample of blood bank.  Hopefully he will have less nausea since he is not receiving Alimta  today.  He has Zofran  and Compazine  at home which he can alternate.   We will see him back for a follow-up visit in 3 weeks.    Will continue to follow with palliative care. He is scheduled to see them   He will continue to follow with the surgical oncologist for his shoulder.  New onset frontal headache New onset frontal headaches occur every other day, unusual pattern. - Will reach out to radonc about headaches  to determine his scheduled brain MRI and if this needs to be sooner. - Consider use of Tylenol  for headache relief.  Bone pain and healing after fracture Bone pain persists in area of previous fracture. Using sling for support and pain relief. - Encourage attendance at orthopedic appointment to assess bone healing. - Continue using sling for support and pain relief.  Weight loss and decreased appetite Decreased appetite and weight loss noted. - Encourage intake of protein supplements, such as Boost, one to two times daily. - Maintain adequate hydration with water.    The patient was advised to call immediately if he has any concerning symptoms in the interval. The patient voices understanding of current disease status and treatment options and is in agreement with the current care plan. All questions were answered. The patient knows to call the clinic with any problems, questions or concerns. We can certainly see the patient much sooner if necessary   Orders Placed This Encounter  Procedures   Sample to Blood Bank    Standing Status:   Standing    Number of Occurrences:   3    Next Expected Occurrence:   08/17/2024    Expiration Date:   08/15/2025    The total time spent in the appointment was 30-39 minutes  Linette Gunderson L Caroleen Stoermer, PA-C 08/15/24

## 2024-08-13 DIAGNOSIS — R6889 Other general symptoms and signs: Secondary | ICD-10-CM | POA: Diagnosis not present

## 2024-08-14 ENCOUNTER — Other Ambulatory Visit: Payer: Self-pay | Admitting: Physician Assistant

## 2024-08-14 DIAGNOSIS — R6889 Other general symptoms and signs: Secondary | ICD-10-CM | POA: Diagnosis not present

## 2024-08-14 DIAGNOSIS — C3491 Malignant neoplasm of unspecified part of right bronchus or lung: Secondary | ICD-10-CM

## 2024-08-15 ENCOUNTER — Inpatient Hospital Stay: Admitting: Nurse Practitioner

## 2024-08-15 ENCOUNTER — Other Ambulatory Visit: Payer: Self-pay

## 2024-08-15 ENCOUNTER — Encounter: Payer: Self-pay | Admitting: Physician Assistant

## 2024-08-15 ENCOUNTER — Encounter: Payer: Self-pay | Admitting: Nurse Practitioner

## 2024-08-15 ENCOUNTER — Encounter: Payer: Self-pay | Admitting: Internal Medicine

## 2024-08-15 ENCOUNTER — Ambulatory Visit

## 2024-08-15 ENCOUNTER — Inpatient Hospital Stay: Admitting: Physician Assistant

## 2024-08-15 ENCOUNTER — Other Ambulatory Visit (HOSPITAL_COMMUNITY): Payer: Self-pay

## 2024-08-15 ENCOUNTER — Inpatient Hospital Stay

## 2024-08-15 DIAGNOSIS — R530 Neoplastic (malignant) related fatigue: Secondary | ICD-10-CM

## 2024-08-15 DIAGNOSIS — C3491 Malignant neoplasm of unspecified part of right bronchus or lung: Secondary | ICD-10-CM

## 2024-08-15 DIAGNOSIS — R6889 Other general symptoms and signs: Secondary | ICD-10-CM | POA: Diagnosis not present

## 2024-08-15 DIAGNOSIS — G893 Neoplasm related pain (acute) (chronic): Secondary | ICD-10-CM

## 2024-08-15 DIAGNOSIS — Z515 Encounter for palliative care: Secondary | ICD-10-CM | POA: Diagnosis not present

## 2024-08-15 DIAGNOSIS — Z5111 Encounter for antineoplastic chemotherapy: Secondary | ICD-10-CM | POA: Diagnosis not present

## 2024-08-15 LAB — CBC WITH DIFFERENTIAL (CANCER CENTER ONLY)
Abs Immature Granulocytes: 0.03 K/uL (ref 0.00–0.07)
Basophils Absolute: 0 K/uL (ref 0.0–0.1)
Basophils Relative: 0 %
Eosinophils Absolute: 0.1 K/uL (ref 0.0–0.5)
Eosinophils Relative: 3 %
HCT: 25.1 % — ABNORMAL LOW (ref 39.0–52.0)
Hemoglobin: 8.3 g/dL — ABNORMAL LOW (ref 13.0–17.0)
Immature Granulocytes: 1 %
Lymphocytes Relative: 49 %
Lymphs Abs: 2.1 K/uL (ref 0.7–4.0)
MCH: 26.9 pg (ref 26.0–34.0)
MCHC: 33.1 g/dL (ref 30.0–36.0)
MCV: 81.2 fL (ref 80.0–100.0)
Monocytes Absolute: 0.7 K/uL (ref 0.1–1.0)
Monocytes Relative: 16 %
Neutro Abs: 1.3 K/uL — ABNORMAL LOW (ref 1.7–7.7)
Neutrophils Relative %: 31 %
Platelet Count: 357 K/uL (ref 150–400)
RBC: 3.09 MIL/uL — ABNORMAL LOW (ref 4.22–5.81)
RDW: 21.9 % — ABNORMAL HIGH (ref 11.5–15.5)
WBC Count: 4.3 K/uL (ref 4.0–10.5)
nRBC: 0 % (ref 0.0–0.2)

## 2024-08-15 LAB — CMP (CANCER CENTER ONLY)
ALT: 10 U/L (ref 0–44)
AST: 19 U/L (ref 15–41)
Albumin: 3.6 g/dL (ref 3.5–5.0)
Alkaline Phosphatase: 64 U/L (ref 38–126)
Anion gap: 8 (ref 5–15)
BUN: 14 mg/dL (ref 6–20)
CO2: 24 mmol/L (ref 22–32)
Calcium: 9.6 mg/dL (ref 8.9–10.3)
Chloride: 108 mmol/L (ref 98–111)
Creatinine: 1.16 mg/dL (ref 0.61–1.24)
GFR, Estimated: >60 mL/min (ref >60)
Glucose, Bld: 85 mg/dL (ref 70–99)
Potassium: 3.3 mmol/L — ABNORMAL LOW (ref 3.5–5.1)
Sodium: 140 mmol/L (ref 135–145)
Total Bilirubin: 0.3 mg/dL (ref 0.0–1.2)
Total Protein: 7.9 g/dL (ref 6.5–8.1)

## 2024-08-15 LAB — TSH: TSH: 0.36 u[IU]/mL (ref 0.350–4.500)

## 2024-08-15 LAB — SAMPLE TO BLOOD BANK

## 2024-08-15 MED ORDER — SODIUM CHLORIDE 0.9 % IV SOLN
350.0000 mg | Freq: Once | INTRAVENOUS | Status: AC
  Administered 2024-08-15: 350 mg via INTRAVENOUS
  Filled 2024-08-15: qty 7

## 2024-08-15 MED ORDER — OXYCODONE-ACETAMINOPHEN 5-325 MG PO TABS: 1.0000 | ORAL_TABLET | Freq: Three times a day (TID) | ORAL | 0 refills | Status: DC | PRN

## 2024-08-15 MED ORDER — SODIUM CHLORIDE 0.9 % IV SOLN: INTRAVENOUS | Status: DC

## 2024-08-15 MED ORDER — PROCHLORPERAZINE MALEATE 10 MG PO TABS
10.0000 mg | ORAL_TABLET | Freq: Once | ORAL | Status: AC
  Administered 2024-08-15: 10 mg via ORAL
  Filled 2024-08-15: qty 1

## 2024-08-15 MED ORDER — CYANOCOBALAMIN 1000 MCG/ML IJ SOLN
1000.0000 ug | Freq: Once | INTRAMUSCULAR | Status: AC
  Administered 2024-08-15: 1000 ug via INTRAMUSCULAR
  Filled 2024-08-15: qty 1

## 2024-08-15 MED ORDER — FOLIC ACID 1 MG PO TABS
1.0000 mg | ORAL_TABLET | Freq: Every day | ORAL | 3 refills | Status: DC
  Filled 2024-08-15 – 2024-10-01 (×2): qty 30, 30d supply, fill #0

## 2024-08-15 NOTE — Patient Instructions (Addendum)
 [facility] - A DEPT OF [facility]   Discharge Instructions: Thank you for choosing [facility] to provide your oncology and hematology care.   If you have a lab appointment with the Cancer Center, please go directly to the Cancer Center and check in at the registration area.   Wear comfortable clothing and clothing appropriate for easy access to any Portacath or PICC line.   We strive to give you quality time with your provider. You may need to reschedule your appointment if you arrive late (15 or more minutes).  Arriving late affects you and other patients whose appointments are after yours.  Also, if you miss three or more appointments without notifying the office, you may be dismissed from the clinic at the provider's discretion.      For prescription refill requests, have your pharmacy contact our office and allow 72 hours for refills to be completed.    Today you received the following chemotherapy and/or immunotherapy agents: Cemiplimab (Libtayo)       To help prevent nausea and vomiting after your treatment, we encourage you to take your nausea medication as directed.  BELOW ARE SYMPTOMS THAT SHOULD BE REPORTED IMMEDIATELY: *FEVER GREATER THAN 100.4 F (38 C) OR HIGHER *CHILLS OR SWEATING *NAUSEA AND VOMITING THAT IS NOT CONTROLLED WITH YOUR NAUSEA MEDICATION *UNUSUAL SHORTNESS OF BREATH *UNUSUAL BRUISING OR BLEEDING *URINARY PROBLEMS (pain or burning when urinating, or frequent urination) *BOWEL PROBLEMS (unusual diarrhea, constipation, pain near the anus) TENDERNESS IN MOUTH AND THROAT WITH OR WITHOUT PRESENCE OF ULCERS (sore throat, sores in mouth, or a toothache) UNUSUAL RASH, SWELLING OR PAIN  UNUSUAL VAGINAL DISCHARGE OR ITCHING   Items with * indicate a potential emergency and should be followed up as soon as possible or go to the Emergency Department if any problems should occur.  Please show the CHEMOTHERAPY ALERT CARD or  IMMUNOTHERAPY ALERT CARD at check-in to the Emergency Department and triage nurse.  Should you have questions after your visit or need to cancel or reschedule your appointment, please contact [facility] - A DEPT OF [facility]  Dept: [phone]  and follow the prompts.  Office hours are 8:00 a.m. to 4:30 p.m. Monday - Friday. Please note that voicemails left after 4:00 p.m. may not be returned until the following business day.  We are closed weekends and major holidays. You have access to a nurse at all times for urgent questions. Please call the main number to the clinic Dept: [phone] and follow the prompts.   For any non-urgent questions, you may also contact your provider using MyChart. We now offer e-Visits for anyone 70 and older to request care online for non-urgent symptoms. For details visit [email or web address].   Also download the MyChart app! Go to the app store, search "MyChart", open the app, select [place], and log in with your MyChart username and password.

## 2024-08-15 NOTE — Progress Notes (Signed)
 Palliative Medicine [facility]  [phone] [phone]   Name: Benjamin Herrera Date: 08/15/2024 MRN: [id number]  DOB: 04-23-1966  Patient Care Team: Benjamin Clam, Herrera as PCP - General (Family Medicine) Benjamin Herrera Benjamin Herrera as PCP - Infectious Diseases (Infectious Diseases) Benjamin Vinie Benjamin Herrera as PCP - Cardiology (Cardiology) Benjamin Lamar [name], Herrera (Inactive) as Consulting Physician (Gastroenterology) Benjamin Standing, Herrera as Consulting Physician (General Surgery) Benjamin Duwaine [name], RN as Oncology Nurse Navigator    INTERVAL HISTORY: Benjamin Herrera is a 58 y.o. male with medical problems include: HIV, hypertension, coronary artery disease (hx MI, status post DES to OM1 in 2017), chronic low back pain, SCC of skin, OSA, polysubstance abuse, GERD, asthma, anxiety, arthritis, more recently he was diagnosed with Adenocarcinoma of right lung, stage 4.   SOCIAL HISTORY:     reports that he has quit smoking. His smoking use included cigarettes. He started smoking about 42 years ago. He has a 42.8 pack-year smoking history. He has never used smokeless tobacco. He reports that he does not currently use drugs after having used the following drugs: Marijuana. Frequency: 3.00 times per week. He reports that he does not drink alcohol.  ADVANCE DIRECTIVES:  Full code full scope. Plans to meet with social work to establish advance directives. Referral to social work for this placed at initial visit. Pending completion of forms.   CODE STATUS: Full code  PAST MEDICAL HISTORY: Past Medical History:  Diagnosis Date   Achalasia and cardiospasm    Allergy    Anxiety    Arthritis    back ,shoulders    Asthma    Boils    under arms hx of   CAD (coronary artery disease)    a. 09/2016: cath showing 100% RCA occlusion with collaterals and 90% OM1 (treated with DES)   Candidiasis of mouth    Candidiasis of unspecified site    Cholelithiasis    Cocaine abuse  (HCC)    Quit 09/2016   Condyloma acuminatum in male of scrotum & anal canal s/p laser ablation 07/03/2012   Coronary artery disease    Dysphagia    Fracture, humerus    Frozen shoulder    left   GERD (gastroesophageal reflux disease)    Heart attack (HCC)    Hemorrhoids    Hepatitis 1993   A   Herpes labialis    HIV (human immunodeficiency virus infection) (HCC) DX 1993   Hypertension Dx 2014   off all bp meds for last 9 or 10 months   Pilonidal disease 01/10/2012   Polysubstance abuse (HCC)    Psoriasis    hx of   Sleep apnea    does  have CPAP   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  MEDICATIONS:  Current Outpatient Medications  Medication Sig Dispense Refill   acetaminophen  (TYLENOL ) 325 MG tablet Take 650 mg by mouth every 6 (six) hours as needed for mild pain (pain score 1-3), moderate pain (pain score 4-6) or headache.     albuterol  (VENTOLIN  HFA) 108 (90 Base) MCG/ACT inhaler Inhale 2 puffs into the lungs every 6 (six) hours as needed for wheezing or shortness of breath. 1 each 2   amLODipine  (NORVASC ) 5 MG tablet Take 1 tablet (5 mg total) by mouth daily. 90  tablet 1   amoxicillin -clavulanate (AUGMENTIN ) 875-125 MG tablet Take 1 tablet by mouth 2 (two) times daily. 20 tablet 0   aspirin  81 MG chewable tablet Chew 1 tablet (81 mg total) by mouth daily. 90 tablet 3   Bictegravir-Emtricitab-Tenofov (BIKTARVY ) 30-120-15 MG TABS Take 1 tablet by mouth daily. 90 tablet 3   Blood Pressure KIT 1 kit by Does not apply route daily. 1 kit 0   diclofenac  Sodium (VOLTAREN  ARTHRITIS PAIN) 1 % GEL Apply 2 g topically 4 (four) times daily. 100 g 3   folic acid  (FOLVITE ) 1 MG tablet Take 1 tablet (1 mg total) by mouth daily. Start 7 days before pemetrexed  chemotherapy. Continue until 21 days after pemetrexed  completed. 30  tablet 3   hydrOXYzine  (ATARAX ) 25 MG tablet Take 1 tablet (25 mg total) by mouth 3 (three) times daily as needed. 30 tablet 0   lidocaine  (XYLOCAINE ) 2 % solution SWISH AND SWALLOW 5 MLS 3 (THREE) TIMES DAILY AS NEEDED FOR MOUTH PAIN. 360 mL 1   lidocaine -prilocaine  (EMLA ) cream Apply to affected area once 30 g 3   LORazepam  (ATIVAN ) 0.5 MG tablet 1 tab po 30 minutes prior to radiation or MRI scans 4 tablet 0   magic mouthwash (nystatin , diphenhydrAMINE , alum & mag hydroxide) suspension mixture Take 5 mLs by mouth 3 (three) times daily as needed for mouth pain. 240 mL 1   methocarbamol  (ROBAXIN ) 500 MG tablet Take 2 tablets (1,000 mg total) by mouth every 8 (eight) hours as needed. 90 tablet 1   metoprolol  succinate (TOPROL -XL) 25 MG 24 hr tablet TAKE 1/2 TABLET BY MOUTH DAILY 45 tablet 0   nitroGLYCERIN  (NITROSTAT ) 0.4 MG SL tablet Place 1 tablet (0.4 mg total) under the tongue every 5 (five) minutes as needed for chest pain (trouble swallowing). 25 tablet 1   nystatin  (MYCOSTATIN ) 100000 UNIT/ML suspension Take by mouth.     ondansetron  (ZOFRAN ) 8 MG tablet Take 1 tablet (8 mg total) by mouth every 8 (eight) hours as needed for nausea or vomiting. Start on the third day after carboplatin . 30 tablet 1   oxyCODONE -acetaminophen  (PERCOCET/ROXICET) 5-325 MG tablet Take 1 tablet by mouth every 8 (eight) hours as needed for severe pain (pain score 7-10). 45 tablet 0   prochlorperazine  (COMPAZINE ) 10 MG tablet Take 1 tablet (10 mg total) by mouth every 6 (six) hours as needed for nausea or vomiting. 30 tablet 1   sulfamethoxazole -trimethoprim  (BACTRIM  DS) 800-160 MG tablet Take 1 tablet by mouth 3 (three) times a week. 36 tablet 3   No current facility-administered medications for this visit.   Facility-Administered Medications Ordered in Other Visits  Medication Dose Route Frequency Provider Last Rate Last Admin   0.9 %  sodium chloride  infusion   Intravenous Continuous Sherrod Sherrod, Herrera   Stopped  at 08/15/24 1135    VITAL SIGNS: There were no vitals taken for this visit. There were no vitals filed for this visit.  Estimated body mass index is 24.78 kg/m as calculated from the following:   Height as of 07/25/24: 6' 1 (1.854 m).   Weight as of an earlier encounter on 08/15/24: 187 lb 12.8 oz (85.2 kg).  PERFORMANCE STATUS (ECOG) : 1 - Symptomatic but completely ambulatory  Physical Exam General: NAD Cardiovascular: regular rate and rhythm Pulmonary: normal breathing pattern Extremities: no edema, no joint deformities Skin: no rashes Neurological: AAO x3  IMPRESSION: Discussed the use of AI scribe software for clinical note transcription with the patient, who gave verbal  consent to proceed.  History of Present Illness Benjamin Herrera is a 58 year old male who presented to clinic for symptom management follow-up.  No acute distress.  No family present.  No issues with constipation or diarrhea. No nausea or vomiting since his last visit.  However, his appetite has been fluctuating, and he has experienced a weight loss of approximately ten pounds since the beginning of October.  Current weight is 187 pounds down from 196 pounds on October 1.  We discussed the importance of nutritional intake.  Encouraged patient to focus on small frequent meals versus large meals.  Also to consume protein drinks at least 2-3 daily for additional protein intake.  He verbalized understanding.  He slipped off the bed recently, resulting in pain in both shoulders. He believes he was half asleep at the time of the fall and confirms that he did not sustain any fractures or injuries from the incident.  Benjamin Herrera reports he experiences persistent pain in his arm, particularly when moving, which starts at the top of the arm and radiates to the elbow. This pain occasionally causes involuntary arm movements when lying down. The pain significantly impacts his ability to perform daily activities.  His pain is being  managed with use of oxycodone  as needed. UDS remains clear of significant illicit drugs outside of occasional marijuana use which he feels enhances his appetite. No changes to current regimen at this time.   We will continue to closely monitor and support.   Assessment & Plan Chronic arm pain  Chronic arm pain persists despite regimen, with pain radiating from the top of the arm to the elbow. The pain is exacerbated by movement and occasionally causes involuntary arm movements when lying down. He reports some improvement in movement but continues to experience discomfort. - Refer to surgeon for evaluation of chronic arm pain - Continue oxycodone  as needed.  - UDS every other visit. -Continue to monitor pain levels and adjust regimen as indicated  Bilateral shoulder pain Bilateral shoulder pain following a fall from bed. No fractures reported. Pain is present but not severe enough to warrant immediate intervention.  Constipation and diarrhea Alternating constipation and diarrhea. Currently taking a stool softener every other day, resulting in bowel movements approximately daily. Goal is to prevent more than two days without a bowel movement. - Increase stool softener to one pill daily - If bowel movements become regular, adjust to one pill every other day - Monitor bowel movement frequency and adjust stool softener dosage as needed  Unintentional weight loss/Decreased appetite  Unintentional weight loss of approximately 10 pounds since the beginning of October. Appetite is variable. - Encourage consumption of Ensure or Boost nutritional supplements - Check for available samples of nutritional supplements  I will plan to see patient back in 2-3 weeks. Sooner if needed.   Patient expressed understanding and was in agreement with this plan. He also understands that He can call the clinic at any time with any questions, concerns, or complaints.   Any controlled substances utilized were  prescribed in the context of palliative care. PDMP has been reviewed.   Visit consisted of counseling and education dealing with the complex and emotionally intense issues of symptom management and palliative care in the setting of serious and potentially life-threatening illness.  Levon Borer, AGPCNP-BC  Palliative Medicine Team/[facility]

## 2024-08-16 DIAGNOSIS — R6889 Other general symptoms and signs: Secondary | ICD-10-CM | POA: Diagnosis not present

## 2024-08-16 LAB — T4: T4, Total: 7.9 ug/dL (ref 4.5–12.0)

## 2024-08-17 DIAGNOSIS — R6889 Other general symptoms and signs: Secondary | ICD-10-CM | POA: Diagnosis not present

## 2024-08-20 DIAGNOSIS — R6889 Other general symptoms and signs: Secondary | ICD-10-CM | POA: Diagnosis not present

## 2024-08-20 NOTE — Progress Notes (Incomplete)
 Follow up call to discuss results from brain MRI on 08/22/24  Pain/headache/vision?

## 2024-08-21 ENCOUNTER — Inpatient Hospital Stay

## 2024-08-21 ENCOUNTER — Other Ambulatory Visit: Payer: Self-pay | Admitting: Radiation Oncology

## 2024-08-21 ENCOUNTER — Other Ambulatory Visit: Payer: Self-pay | Admitting: Internal Medicine

## 2024-08-21 DIAGNOSIS — R1319 Other dysphagia: Secondary | ICD-10-CM | POA: Diagnosis not present

## 2024-08-21 DIAGNOSIS — C3491 Malignant neoplasm of unspecified part of right bronchus or lung: Secondary | ICD-10-CM | POA: Diagnosis not present

## 2024-08-21 MED ORDER — PREDNISONE 50 MG PO TABS: ORAL_TABLET | ORAL | 0 refills | Status: DC

## 2024-08-22 ENCOUNTER — Encounter: Payer: Self-pay | Admitting: Physician Assistant

## 2024-08-22 ENCOUNTER — Other Ambulatory Visit: Payer: Self-pay | Admitting: *Deleted

## 2024-08-22 ENCOUNTER — Inpatient Hospital Stay: Admission: RE | Admit: 2024-08-22 | Source: Ambulatory Visit

## 2024-08-22 ENCOUNTER — Encounter: Payer: Self-pay | Admitting: Internal Medicine

## 2024-08-22 ENCOUNTER — Other Ambulatory Visit: Payer: Self-pay | Admitting: Radiation Oncology

## 2024-08-22 DIAGNOSIS — R6889 Other general symptoms and signs: Secondary | ICD-10-CM | POA: Diagnosis not present

## 2024-08-22 MED ORDER — LORAZEPAM 0.5 MG PO TABS: ORAL_TABLET | ORAL | 0 refills | Status: DC

## 2024-08-22 MED ORDER — PREDNISONE 50 MG PO TABS: ORAL_TABLET | ORAL | 0 refills | Status: AC

## 2024-08-23 ENCOUNTER — Other Ambulatory Visit: Payer: Self-pay | Admitting: Radiation Therapy

## 2024-08-23 ENCOUNTER — Telehealth: Payer: Self-pay | Admitting: Radiation Oncology

## 2024-08-23 DIAGNOSIS — C3491 Malignant neoplasm of unspecified part of right bronchus or lung: Secondary | ICD-10-CM

## 2024-08-23 DIAGNOSIS — C7951 Secondary malignant neoplasm of bone: Secondary | ICD-10-CM

## 2024-08-23 DIAGNOSIS — R6889 Other general symptoms and signs: Secondary | ICD-10-CM | POA: Diagnosis not present

## 2024-08-23 DIAGNOSIS — C7931 Secondary malignant neoplasm of brain: Secondary | ICD-10-CM

## 2024-08-23 NOTE — Telephone Encounter (Signed)
 The patient missed his MRI scan over the weekend due to his pharmacy not having received a refill and Ativan  for management of his claustrophobia.  A new prescription was sent in yesterday.  When he spoke with our special procedures navigator, he verbalizes that he was very concerned about being able to lay flat on the MRI table due to progressive back pain.  The patient has a known history of metastatic lung cancer and his original PET scan in June 2025 did identify disease at L2 and in the sacrum.  His most recent CT scan in September did not call out these findings specifically in the body of the report.  I mentioned his symptoms to Dr. Dewey who reviewed his imaging personally and Dr. Dewey feels that the sacral area was very small and does not appear to be as concerning as an area as the L2-L5 region.  He would like to offer the patient palliative radiation to try and improve his pain.  The patient has been working with the palliative care clinic at the cancer center for pain management.  When I called today to find out more about his symptoms he describes a shooting sharp pain in the low back, he states this bothers him off and on throughout the day every 3 to 5 minutes and he has some relief with his narcotic pain regimen however is still struggling throughout the day to do most activities because of the pain.  We talked about the possibility of using an additional course of palliative radiation to L2-5 and after discussing a course of 10 fractions, the patient is interested in proceeding.  I also discussed that we should ask interventional radiology to see him to see if they think he would also be a candidate for any osteo cool procedures following radiation as he is motivated to proceed with radiation first.  The patient will be referred and I have notified Dr. Hughes  about his case.  I offered simulation next week which he agreed and I have sent messages to our scheduling team to coordinate this.  He  will sign written consent to proceed after we review this on the day of his simulation.

## 2024-08-24 DIAGNOSIS — R6889 Other general symptoms and signs: Secondary | ICD-10-CM | POA: Diagnosis not present

## 2024-08-27 ENCOUNTER — Inpatient Hospital Stay

## 2024-08-27 ENCOUNTER — Other Ambulatory Visit (HOSPITAL_COMMUNITY): Payer: Self-pay

## 2024-08-27 ENCOUNTER — Ambulatory Visit: Admitting: Radiation Oncology

## 2024-08-27 ENCOUNTER — Encounter: Payer: Self-pay | Admitting: Radiology

## 2024-08-27 DIAGNOSIS — R6889 Other general symptoms and signs: Secondary | ICD-10-CM | POA: Diagnosis not present

## 2024-08-27 NOTE — Progress Notes (Signed)
 Patient here discuss results from brain MRI on 08/31/24.  Patient states he is doing well, pain 2-3/10 to neck and back.

## 2024-08-28 ENCOUNTER — Inpatient Hospital Stay

## 2024-08-28 DIAGNOSIS — R6889 Other general symptoms and signs: Secondary | ICD-10-CM | POA: Diagnosis not present

## 2024-08-29 ENCOUNTER — Ambulatory Visit
Admission: RE | Admit: 2024-08-29 | Discharge: 2024-08-29 | Disposition: A | Source: Ambulatory Visit | Attending: Radiation Oncology | Admitting: Radiation Oncology

## 2024-08-29 ENCOUNTER — Ambulatory Visit

## 2024-08-29 ENCOUNTER — Other Ambulatory Visit: Payer: Self-pay | Admitting: Physician Assistant

## 2024-08-29 ENCOUNTER — Other Ambulatory Visit (HOSPITAL_COMMUNITY): Payer: Self-pay

## 2024-08-29 ENCOUNTER — Other Ambulatory Visit: Payer: Self-pay | Admitting: Infectious Diseases

## 2024-08-29 ENCOUNTER — Other Ambulatory Visit: Payer: Self-pay | Admitting: Internal Medicine

## 2024-08-29 ENCOUNTER — Ambulatory Visit
Admission: RE | Admit: 2024-08-29 | Discharge: 2024-08-29 | Disposition: A | Discharge status: Home / Self Care | Source: Ambulatory Visit | Attending: Internal Medicine | Admitting: Internal Medicine

## 2024-08-29 DIAGNOSIS — Z87891 Personal history of nicotine dependence: Secondary | ICD-10-CM | POA: Diagnosis not present

## 2024-08-29 DIAGNOSIS — Z510 Encounter for antineoplastic radiation therapy: Secondary | ICD-10-CM | POA: Diagnosis not present

## 2024-08-29 DIAGNOSIS — C3491 Malignant neoplasm of unspecified part of right bronchus or lung: Secondary | ICD-10-CM

## 2024-08-29 DIAGNOSIS — C3411 Malignant neoplasm of upper lobe, right bronchus or lung: Secondary | ICD-10-CM | POA: Insufficient documentation

## 2024-08-29 DIAGNOSIS — Z5111 Encounter for antineoplastic chemotherapy: Secondary | ICD-10-CM | POA: Insufficient documentation

## 2024-08-29 DIAGNOSIS — R6889 Other general symptoms and signs: Secondary | ICD-10-CM | POA: Diagnosis not present

## 2024-08-29 DIAGNOSIS — C7951 Secondary malignant neoplasm of bone: Secondary | ICD-10-CM | POA: Diagnosis not present

## 2024-08-29 DIAGNOSIS — C7931 Secondary malignant neoplasm of brain: Secondary | ICD-10-CM | POA: Insufficient documentation

## 2024-08-29 DIAGNOSIS — Z5112 Encounter for antineoplastic immunotherapy: Secondary | ICD-10-CM | POA: Insufficient documentation

## 2024-08-29 DIAGNOSIS — E876 Hypokalemia: Secondary | ICD-10-CM

## 2024-08-29 LAB — CBC WITH DIFFERENTIAL (CANCER CENTER ONLY)
Abs Immature Granulocytes: 0.02 K/uL (ref 0.00–0.07)
Basophils Absolute: 0.1 K/uL (ref 0.0–0.1)
Basophils Relative: 1 %
Eosinophils Absolute: 0.2 K/uL (ref 0.0–0.5)
Eosinophils Relative: 3 %
HCT: 24.8 % — ABNORMAL LOW (ref 39.0–52.0)
Hemoglobin: 8.2 g/dL — ABNORMAL LOW (ref 13.0–17.0)
Immature Granulocytes: 0 %
Lymphocytes Relative: 39 %
Lymphs Abs: 1.8 K/uL (ref 0.7–4.0)
MCH: 27.8 pg (ref 26.0–34.0)
MCHC: 33.1 g/dL (ref 30.0–36.0)
MCV: 84.1 fL (ref 80.0–100.0)
Monocytes Absolute: 0.5 K/uL (ref 0.1–1.0)
Monocytes Relative: 11 %
Neutro Abs: 2.2 K/uL (ref 1.7–7.7)
Neutrophils Relative %: 46 %
Platelet Count: 145 K/uL — ABNORMAL LOW (ref 150–400)
RBC: 2.95 MIL/uL — ABNORMAL LOW (ref 4.22–5.81)
RDW: 20.8 % — ABNORMAL HIGH (ref 11.5–15.5)
WBC Count: 4.7 K/uL (ref 4.0–10.5)
nRBC: 0 % (ref 0.0–0.2)

## 2024-08-29 LAB — CMP (CANCER CENTER ONLY)
ALT: 7 U/L (ref 0–44)
AST: 19 U/L (ref 15–41)
Albumin: 3.5 g/dL (ref 3.5–5.0)
Alkaline Phosphatase: 55 U/L (ref 38–126)
Anion gap: 8 (ref 5–15)
BUN: 16 mg/dL (ref 6–20)
CO2: 25 mmol/L (ref 22–32)
Calcium: 9.1 mg/dL (ref 8.9–10.3)
Chloride: 107 mmol/L (ref 98–111)
Creatinine: 1.15 mg/dL (ref 0.61–1.24)
GFR, Estimated: >60 mL/min (ref >60)
Glucose, Bld: 119 mg/dL — ABNORMAL HIGH (ref 70–99)
Potassium: 3.2 mmol/L — ABNORMAL LOW (ref 3.5–5.1)
Sodium: 140 mmol/L (ref 135–145)
Total Bilirubin: 0.3 mg/dL (ref 0.0–1.2)
Total Protein: 7.5 g/dL (ref 6.5–8.1)

## 2024-08-29 LAB — SAMPLE TO BLOOD BANK

## 2024-08-29 MED ORDER — NYSTATIN 100000 UNIT/ML MT SUSP
5.0000 mL | Freq: Three times a day (TID) | OROMUCOSAL | 1 refills | Status: AC | PRN
  Filled 2024-08-29: qty 240, 16d supply, fill #0
  Filled 2024-10-01: qty 210, 14d supply, fill #0

## 2024-08-29 MED ORDER — POTASSIUM CHLORIDE CRYS ER 20 MEQ PO TBCR: 20.0000 meq | EXTENDED_RELEASE_TABLET | Freq: Every day | ORAL | 0 refills | Status: DC

## 2024-08-30 ENCOUNTER — Other Ambulatory Visit (HOSPITAL_COMMUNITY): Payer: Self-pay

## 2024-08-30 DIAGNOSIS — R6889 Other general symptoms and signs: Secondary | ICD-10-CM | POA: Diagnosis not present

## 2024-08-31 ENCOUNTER — Ambulatory Visit (HOSPITAL_COMMUNITY)
Admission: RE | Admit: 2024-08-31 | Discharge: 2024-08-31 | Disposition: A | Discharge status: Home / Self Care | Source: Ambulatory Visit | Attending: Radiation Oncology | Admitting: Radiation Oncology

## 2024-08-31 DIAGNOSIS — C7931 Secondary malignant neoplasm of brain: Secondary | ICD-10-CM

## 2024-08-31 DIAGNOSIS — R29890 Loss of height: Secondary | ICD-10-CM | POA: Diagnosis not present

## 2024-08-31 DIAGNOSIS — R6889 Other general symptoms and signs: Secondary | ICD-10-CM | POA: Diagnosis not present

## 2024-08-31 DIAGNOSIS — C7951 Secondary malignant neoplasm of bone: Secondary | ICD-10-CM | POA: Diagnosis not present

## 2024-08-31 DIAGNOSIS — M549 Dorsalgia, unspecified: Secondary | ICD-10-CM | POA: Diagnosis not present

## 2024-08-31 DIAGNOSIS — C7949 Secondary malignant neoplasm of other parts of nervous system: Secondary | ICD-10-CM | POA: Insufficient documentation

## 2024-08-31 DIAGNOSIS — G9389 Other specified disorders of brain: Secondary | ICD-10-CM | POA: Diagnosis not present

## 2024-08-31 MED ORDER — GADOBUTROL 1 MMOL/ML IV SOLN
10.0000 mL | Freq: Once | INTRAVENOUS | Status: AC | PRN
  Administered 2024-08-31: 10 mL via INTRAVENOUS

## 2024-09-03 ENCOUNTER — Ambulatory Visit
Admission: RE | Admit: 2024-09-03 | Discharge: 2024-09-03 | Disposition: A | Discharge status: Home / Self Care | Source: Ambulatory Visit | Attending: Radiation Oncology | Admitting: Radiation Oncology

## 2024-09-03 ENCOUNTER — Other Ambulatory Visit: Payer: Self-pay | Admitting: Acute Care

## 2024-09-03 ENCOUNTER — Inpatient Hospital Stay

## 2024-09-03 ENCOUNTER — Telehealth: Payer: Self-pay | Admitting: Radiation Oncology

## 2024-09-03 ENCOUNTER — Other Ambulatory Visit: Payer: Self-pay

## 2024-09-03 ENCOUNTER — Telehealth: Payer: Self-pay | Admitting: Family Medicine

## 2024-09-03 VITALS — BP 135/83 | HR 93 | Temp 98.6°F | Resp 20 | Ht 73.0 in | Wt 191.0 lb

## 2024-09-03 DIAGNOSIS — K219 Gastro-esophageal reflux disease without esophagitis: Secondary | ICD-10-CM | POA: Insufficient documentation

## 2024-09-03 DIAGNOSIS — J45909 Unspecified asthma, uncomplicated: Secondary | ICD-10-CM | POA: Insufficient documentation

## 2024-09-03 DIAGNOSIS — C3411 Malignant neoplasm of upper lobe, right bronchus or lung: Secondary | ICD-10-CM | POA: Insufficient documentation

## 2024-09-03 DIAGNOSIS — C3491 Malignant neoplasm of unspecified part of right bronchus or lung: Secondary | ICD-10-CM

## 2024-09-03 DIAGNOSIS — M1990 Unspecified osteoarthritis, unspecified site: Secondary | ICD-10-CM | POA: Insufficient documentation

## 2024-09-03 DIAGNOSIS — Z800 Family history of malignant neoplasm of digestive organs: Secondary | ICD-10-CM | POA: Insufficient documentation

## 2024-09-03 DIAGNOSIS — G4730 Sleep apnea, unspecified: Secondary | ICD-10-CM | POA: Insufficient documentation

## 2024-09-03 DIAGNOSIS — B20 Human immunodeficiency virus [HIV] disease: Secondary | ICD-10-CM | POA: Insufficient documentation

## 2024-09-03 DIAGNOSIS — Z79631 Long term (current) use of antimetabolite agent: Secondary | ICD-10-CM | POA: Insufficient documentation

## 2024-09-03 DIAGNOSIS — C7951 Secondary malignant neoplasm of bone: Secondary | ICD-10-CM | POA: Insufficient documentation

## 2024-09-03 DIAGNOSIS — Z7982 Long term (current) use of aspirin: Secondary | ICD-10-CM | POA: Insufficient documentation

## 2024-09-03 DIAGNOSIS — Z79899 Other long term (current) drug therapy: Secondary | ICD-10-CM | POA: Insufficient documentation

## 2024-09-03 DIAGNOSIS — C7931 Secondary malignant neoplasm of brain: Secondary | ICD-10-CM | POA: Insufficient documentation

## 2024-09-03 DIAGNOSIS — Z79620 Long term (current) use of immunosuppressive biologic: Secondary | ICD-10-CM | POA: Insufficient documentation

## 2024-09-03 DIAGNOSIS — M542 Cervicalgia: Secondary | ICD-10-CM | POA: Insufficient documentation

## 2024-09-03 DIAGNOSIS — I2510 Atherosclerotic heart disease of native coronary artery without angina pectoris: Secondary | ICD-10-CM | POA: Insufficient documentation

## 2024-09-03 DIAGNOSIS — Z923 Personal history of irradiation: Secondary | ICD-10-CM | POA: Insufficient documentation

## 2024-09-03 DIAGNOSIS — S32000A Wedge compression fracture of unspecified lumbar vertebra, initial encounter for closed fracture: Secondary | ICD-10-CM

## 2024-09-03 DIAGNOSIS — Z86018 Personal history of other benign neoplasm: Secondary | ICD-10-CM | POA: Insufficient documentation

## 2024-09-03 DIAGNOSIS — Z87891 Personal history of nicotine dependence: Secondary | ICD-10-CM | POA: Insufficient documentation

## 2024-09-03 DIAGNOSIS — F1411 Cocaine abuse, in remission: Secondary | ICD-10-CM | POA: Insufficient documentation

## 2024-09-03 DIAGNOSIS — I10 Essential (primary) hypertension: Secondary | ICD-10-CM | POA: Insufficient documentation

## 2024-09-03 DIAGNOSIS — Z5112 Encounter for antineoplastic immunotherapy: Secondary | ICD-10-CM | POA: Diagnosis not present

## 2024-09-03 DIAGNOSIS — M7502 Adhesive capsulitis of left shoulder: Secondary | ICD-10-CM | POA: Insufficient documentation

## 2024-09-03 DIAGNOSIS — C775 Secondary and unspecified malignant neoplasm of intrapelvic lymph nodes: Secondary | ICD-10-CM

## 2024-09-03 DIAGNOSIS — Z5111 Encounter for antineoplastic chemotherapy: Secondary | ICD-10-CM | POA: Insufficient documentation

## 2024-09-03 LAB — RAD ONC ARIA SESSION SUMMARY
Course Elapsed Days: 0
Plan Fractions Treated to Date: 1
Plan Prescribed Dose Per Fraction: 3 Gy
Plan Total Fractions Prescribed: 10
Plan Total Prescribed Dose: 30 Gy
Reference Point Dosage Given to Date: 3 Gy
Reference Point Session Dosage Given: 3 Gy
Session Number: 1

## 2024-09-03 LAB — CMP (CANCER CENTER ONLY)
ALT: 7 U/L (ref 0–44)
AST: 18 U/L (ref 15–41)
Albumin: 3.7 g/dL (ref 3.5–5.0)
Alkaline Phosphatase: 60 U/L (ref 38–126)
Anion gap: 8 (ref 5–15)
BUN: 13 mg/dL (ref 6–20)
CO2: 26 mmol/L (ref 22–32)
Calcium: 9.6 mg/dL (ref 8.9–10.3)
Chloride: 106 mmol/L (ref 98–111)
Creatinine: 1.22 mg/dL (ref 0.61–1.24)
GFR, Estimated: >60 mL/min (ref >60)
Glucose, Bld: 82 mg/dL (ref 70–99)
Potassium: 3.6 mmol/L (ref 3.5–5.1)
Sodium: 140 mmol/L (ref 135–145)
Total Bilirubin: 0.4 mg/dL (ref 0.0–1.2)
Total Protein: 7.9 g/dL (ref 6.5–8.1)

## 2024-09-03 LAB — SAMPLE TO BLOOD BANK

## 2024-09-03 LAB — CBC WITH DIFFERENTIAL (CANCER CENTER ONLY)
Abs Immature Granulocytes: 0.01 K/uL (ref 0.00–0.07)
Basophils Absolute: 0 K/uL (ref 0.0–0.1)
Basophils Relative: 1 %
Eosinophils Absolute: 0.1 K/uL (ref 0.0–0.5)
Eosinophils Relative: 2 %
HCT: 26.3 % — ABNORMAL LOW (ref 39.0–52.0)
Hemoglobin: 8.6 g/dL — ABNORMAL LOW (ref 13.0–17.0)
Immature Granulocytes: 0 %
Lymphocytes Relative: 34 %
Lymphs Abs: 1.9 K/uL (ref 0.7–4.0)
MCH: 27.7 pg (ref 26.0–34.0)
MCHC: 32.7 g/dL (ref 30.0–36.0)
MCV: 84.6 fL (ref 80.0–100.0)
Monocytes Absolute: 0.5 K/uL (ref 0.1–1.0)
Monocytes Relative: 10 %
Neutro Abs: 2.9 K/uL (ref 1.7–7.7)
Neutrophils Relative %: 53 %
Platelet Count: 164 K/uL (ref 150–400)
RBC: 3.11 MIL/uL — ABNORMAL LOW (ref 4.22–5.81)
RDW: 19.4 % — ABNORMAL HIGH (ref 11.5–15.5)
WBC Count: 5.5 K/uL (ref 4.0–10.5)
nRBC: 0 % (ref 0.0–0.2)

## 2024-09-03 NOTE — Progress Notes (Signed)
 Radiation Oncology         ([phone]) [phone] ________________________________  Name: Benjamin Herrera        MRN: [id number]  Date of Service: 09/03/2024 DOB: 1966/08/20  RR:Wztopw, Corrina, MD      REFERRING PHYSICIAN: Dr. Sherrod   DIAGNOSIS: The primary encounter diagnosis was Adenocarcinoma of right lung, stage 4 (HCC). Diagnoses of Metastasis to brain [facility]) and Secondary malignant neoplasm of bone [facility]) were also pertinent to this visit.   HISTORY OF PRESENT ILLNESS: Benjamin Herrera is a 58 y.o. male with a history of Stage IV, cT2bN3M1c, NSCLC, adenocarcinoma of the RUL. The patient presented to his PCP in January 2025 with complaints of left shoulder pain.  He had persisting pain and despite negative x-rays in January, when these were repeated in May 2025 and invasive lesion of the left proximal humerus were noted.  He had also had an ER evaluation around that time that showed a spiculated mass in the apex of the right lung, enlarged pretracheal and right hilar adenopathy and this finding in the left humerus.  A biopsy of the right lung mass on 04/03/2024 were consistent with adenocarcinoma of the lung. 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  A PET scan showed scattered lytic osseous metastatic disease and he was offered a palliative course of radiotherapy to his right lung and left humerus which were completed in July 2025.  He began systemic palliative chemoimmunotherapy on 05/02/2024 and continues with Libtayo  and Alimta . He had SRS treatment in 3 fractions to the right precentral gyrus. His last infusion was on 08/15/2024.  He had been scheduled for surveillance after SRS treatment to the right prefrontal gyrus as the pineal lesion had been followed.  He missed his MRI scan due to pain with laying flat and Dr. Dewey evaluated his more recent imaging with  CT scans and offered palliative radiation to his L2 spine which he just began today.  He was able to have an MRI of the L-spine and brain on 08/31/2024, the L-spine MRI scan confirmed the known disease at L2 with a compression fracture fracture at the superior endplate with 20% height loss and a partially visualized sacral metastasis that has appeared to have progressed from June 2025's PET scan his MRI of the brain was limited by motion but showed an increase in the pineal lesion measuring up to 1.8 cm.  His case was discussed this morning and brain and spine oncology conference.  It was recommended that he be offered stereotactic radiosurgery in a single fraction to the pineal gland, continuation of palliative radiation to his lumbar spine, and consideration of vertebral augmentation.  His case is known to Dr. Lanis and to Dr. Mignon Collar after reaching out about these findings, Dr. Ulis plans to see the patient to offer intervention to the lumbar spine and possibly the sacral spine but recommends an MRI in order to make this recommendation.  He is seen today in clinic to discuss the results of his MRI scans.    PREVIOUS RADIATION THERAPY: Yes   06/15/24-06/20/24 SRS Treatment Plan Name: Brain_SRT_dca Site: Brain [id number] Technique: SBRT/SRT-3D Mode: Photon Dose Per Fraction: 9 Gy Prescribed Dose (Delivered / Prescribed): 27 Gy / 27 Gy Prescribed Fxs (Delivered / Prescribed): 3 / 3    04/19/24-05/03/24 Plan Name: Lung_R Site: Lung, Right Technique:  3D Mode: Photon Dose Per Fraction: 3 Gy Prescribed Dose (Delivered / Prescribed): 30 Gy / 30 Gy Prescribed Fxs (Delivered / Prescribed): 10 / 10   Plan Name: Ext_L_Humerus Site: Humerus, Left Technique: Isodose Plan Mode: Photon Dose Per Fraction: 3 Gy Prescribed Dose (Delivered / Prescribed): 30 Gy / 30 Gy Prescribed Fxs (Delivered / Prescribed): 10 / 10     PAST MEDICAL HISTORY:  Past Medical History:   Diagnosis Date   Achalasia and cardiospasm    Allergy    Anxiety    Arthritis    back ,shoulders    Asthma    Boils    under arms hx of   CAD (coronary artery disease)    a. 09/2016: cath showing 100% RCA occlusion with collaterals and 90% OM1 (treated with DES)   Candidiasis of mouth    Candidiasis of unspecified site    Cholelithiasis    Cocaine abuse (HCC)    Quit 09/2016   Condyloma acuminatum in male of scrotum & anal canal s/p laser ablation 07/03/2012   Coronary artery disease    Dysphagia    Fracture, humerus    Frozen shoulder    left   GERD (gastroesophageal reflux disease)    Heart attack (HCC)    Hemorrhoids    Hepatitis 1993   A   Herpes labialis    HIV (human immunodeficiency virus infection) (HCC) DX 1993   Hypertension Dx 2014   off all bp meds for last 9 or 10 months   Pilonidal disease 01/10/2012   Polysubstance abuse (HCC)    Psoriasis    hx of   Sleep apnea    does  have CPAP   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/03/2012   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 08/03/2012       PAST SURGICAL HISTORY: Past Surgical History:  Procedure Laterality Date   BALLOON DILATION N/A 11/11/2015   Procedure: BALLOON DILATION;  Surgeon: Gustav Shila GAILS, MD;  [facility];  Service: Endoscopy;  Laterality: N/A;   BOTOX  INJECTION  10/11/2012   Procedure: BOTOX  INJECTION;  Surgeon: Lamar JONETTA Aho, MD;  [facility];  Service: Endoscopy;  Laterality: N/A;   BOTOX  INJECTION N/A 01/25/2013   Procedure: BOTOX  INJECTION;  Surgeon: Lamar JONETTA Aho, MD;  [facility];  Service: Endoscopy;  Laterality: N/A;   BOTOX  INJECTION N/A 06/21/2013   Procedure: BOTOX  INJECTION;  Surgeon: Lamar JONETTA Aho, MD;  [facility];  Service: Endoscopy;  Laterality: N/A;   BOTOX  INJECTION N/A 10/08/2013   Procedure: BOTOX  INJECTION;  Surgeon: Lamar JONETTA Aho, MD;  [facility];  Service: Endoscopy;  Laterality: N/A;   BOTOX  INJECTION N/A 06/16/2015   Procedure: BOTOX  INJECTION;  Surgeon: Lamar JONETTA Aho, MD;  [facility];  Service: Endoscopy;  Laterality: N/A;   BRONCHIAL BIOPSY  04/03/2024   Procedure: BRONCHOSCOPY, WITH BIOPSY;  Surgeon: Shelah Lamar RAMAN, MD;  [facility] END[facility]COPY;  Service: Pulmonary;;   BRONCHIAL BRUSHINGS  04/03/2024   Procedure: BRONCHOSCOPY, WITH BRUSH BIOPSY;  Surgeon: Shelah Lamar RAMAN, MD;  Location: [facility] ENDOSCOPY;  Service: Pulmonary;;   BRONCHIAL NEEDLE ASPIRATION BIOPSY  04/03/2024   Procedure: BRONCHOSCOPY, WITH NEEDLE ASPIRATION BIOPSY;  Surgeon: Shelah Lamar RAMAN, MD;  Location: [facility] ENDOSCOPY;  Service: Pulmonary;;   BRONCHOSCOPY, WITH BIOPSY USING ELECTROMAGNETIC NAVIGATION Right 04/03/2024   Procedure: BRONCHOSCOPY, WITH BIOPSY USING ELECTROMAGNETIC NAVIGATION;  Surgeon: Shelah Lamar RAMAN,  MD;  Location: [facility];  Service: Pulmonary;  Laterality: Right;   CARDIAC CATHETERIZATION N/A 10/11/2016   Procedure: Left Heart Cath and Coronary Angiography;  Surgeon: Lonni JONETTA Cash, MD;  mRCA 100% w/ L>R collaterals, OM1 90%   CARDIAC CATHETERIZATION N/A 10/11/2016   Procedure: Coronary Stent Intervention;  Surgeon: Lonni JONETTA Cash, MD;  RESOLUTE ONYX 3.5X15 DES OM1   COLONOSCOPY  05/19/2012   Procedure: COLONOSCOPY;  Surgeon: Lamar JONETTA Aho, MD;  [facility];  Service: Endoscopy;  Laterality: N/A;  jill trying to contact pt to come in 0830 for 930 case, phone not accepting messages   COLONOSCOPY     ESOPHAGEAL MANOMETRY  05/29/2012   Procedure: ESOPHAGEAL MANOMETRY (EM);  Surgeon: Lamar JONETTA Aho, MD;  [facility];  Service: Endoscopy;  Laterality: N/A;   ESOPHAGEAL MANOMETRY N/A 10/06/2015   Procedure: ESOPHAGEAL MANOMETRY (EM);  Surgeon: Gustav Shila GAILS, MD;  [facility];  Service: Endoscopy;  Laterality: N/A;   ESOPHAGOGASTRODUODENOSCOPY  11/11/2011   Procedure:  ESOPHAGOGASTRODUODENOSCOPY (EGD);  Surgeon: Lamar JONETTA Aho, MD;  Location: [facility];  Service: Endoscopy;  Laterality: N/A;  botox  injection  called Pt to change time of procedure per Dr Aho   ESOPHAGOGASTRODUODENOSCOPY  03/10/2012   Procedure: ESOPHAGOGASTRODUODENOSCOPY (EGD);  Surgeon: Lamar JONETTA Aho, MD;  Location: [facility];  Service: Endoscopy;  Laterality: N/A;   ESOPHAGOGASTRODUODENOSCOPY  10/11/2012   Procedure: ESOPHAGOGASTRODUODENOSCOPY (EGD);  Surgeon: Lamar JONETTA Aho, MD;  Location: [facility];  Service: Endoscopy;  Laterality: N/A;   ESOPHAGOGASTRODUODENOSCOPY N/A 01/25/2013   Procedure: ESOPHAGOGASTRODUODENOSCOPY (EGD);  Surgeon: Lamar JONETTA Aho, MD;  Location: [facility];  Service: Endoscopy;  Laterality: N/A;   ESOPHAGOGASTRODUODENOSCOPY N/A 06/21/2013   Procedure: ESOPHAGOGASTRODUODENOSCOPY (EGD);  Surgeon: Lamar JONETTA Aho, MD;  Location: [facility];  Service: Endoscopy;  Laterality: N/A;   ESOPHAGOGASTRODUODENOSCOPY (EGD) WITH PROPOFOL  N/A 10/08/2013   Procedure: ESOPHAGOGASTRODUODENOSCOPY (EGD) WITH PROPOFOL ;  Surgeon: Lamar JONETTA Aho, MD;  [facility];  Service: Endoscopy;  Laterality: N/A;   ESOPHAGOGASTRODUODENOSCOPY (EGD) WITH PROPOFOL  N/A 06/16/2015   Procedure: ESOPHAGOGASTRODUODENOSCOPY (EGD) WITH PROPOFOL ;  Surgeon: Lamar JONETTA Aho, MD;  [facility];  Service: Endoscopy;  Laterality: N/A;   ESOPHAGOGASTRODUODENOSCOPY (EGD) WITH PROPOFOL  N/A 11/11/2015   Procedure: ESOPHAGOGASTRODUODENOSCOPY (EGD) WITH PROPOFOL ;  Surgeon: Gustav Shila GAILS, MD;  [facility];  Service: Endoscopy;  Laterality: N/A;   EXAMINATION UNDER ANESTHESIA  07/24/2012   Procedure: EXAM UNDER ANESTHESIA;  Surgeon: Elspeth KYM Schultze, MD;  [facility];  Service: General;  Laterality: N/A;   humeral fracture surgery Right yrs ago   PILONIDAL CYST EXCISION  07/24/2012   Procedure: CYST EXCISION PILONIDAL SIMPLE;  Surgeon: Elspeth KYM Schultze, MD;  [facility];  Service: General;  Laterality: N/A;  Exam Under Anesthesia,, Excision Pilonidal Disease,    right shoulder replacement  2008   x 2   UPPER GASTROINTESTINAL ENDOSCOPY     WART FULGURATION  07/24/2012   Procedure: FULGURATION ANAL WART;  Surgeon: Elspeth KYM Schultze, MD;  [facility]: [facility];  Service: General;  Laterality: N/A;  excision of raphe mass     FAMILY HISTORY:  Family History  Problem Relation Age of Onset   Arthritis Mother    Hypertension Mother    Heart disease Mother        MI age 63   Heart attack Sister    Diabetes Maternal Grandmother    Stomach cancer Paternal Grandmother    Colon cancer Neg Hx  Colon polyps Neg Hx    Esophageal cancer Neg Hx    Rectal cancer Neg Hx      SOCIAL HISTORY:  reports that he has quit smoking. His smoking use included cigarettes. He started smoking about 42 years ago. He has a 42.9 pack-year smoking history. He has never used smokeless tobacco. He reports that he does not currently use drugs after having used the following drugs: Marijuana. Frequency: 3.00 times per week. He reports that he does not drink alcohol. The patient is single and lives in [place].   ALLERGIES: Citrus   MEDICATIONS:  Current Outpatient Medications  Medication Sig Dispense Refill   acetaminophen  (TYLENOL ) 325 MG tablet Take 650 mg by mouth every 6 (six) hours as needed for mild pain (pain score 1-3), moderate pain (pain score 4-6) or headache.     albuterol  (VENTOLIN  HFA) 108 (90 Base) MCG/ACT inhaler Inhale 2 puffs into the lungs every 6 (six) hours as needed for wheezing or shortness of breath. 1 each 2   amoxicillin -clavulanate (AUGMENTIN ) 875-125 MG tablet Take 1 tablet by mouth 2 (two) times daily. 20 tablet 0   aspirin  81 MG chewable tablet Chew 1 tablet (81 mg total) by mouth daily. 90 tablet 3   Bictegravir-Emtricitab-Tenofov (BIKTARVY ) 30-120-15 MG TABS Take 1 tablet by mouth daily. 90 tablet 3   Blood Pressure KIT 1 kit by Does not apply route  daily. 1 kit 0   diclofenac  Sodium (VOLTAREN  ARTHRITIS PAIN) 1 % GEL Apply 2 g topically 4 (four) times daily. 100 g 3   folic acid  (FOLVITE ) 1 MG tablet Take 1 tablet (1 mg total) by mouth daily. Start 7 days before pemetrexed  chemotherapy. Continue until 21 days after pemetrexed  completed. 30 tablet 3   hydrOXYzine  (ATARAX ) 25 MG tablet Take 1 tablet (25 mg total) by mouth 3 (three) times daily as needed. 30 tablet 0   lidocaine  (XYLOCAINE ) 2 % solution SWISH AND SWALLOW 5 MLS 3 (THREE) TIMES DAILY AS NEEDED FOR MOUTH PAIN. 360 mL 1   lidocaine -prilocaine  (EMLA ) cream Apply to affected area once 30 g 3   LORazepam  (ATIVAN ) 0.5 MG tablet TAKE 1 TAB BY MOUTH 30 MINUTES PRIOR TO RADIATION OR MRI SCANS 4 tablet 0   LORazepam  (ATIVAN ) 0.5 MG tablet 1 tab po 30 minutes prior to radiation or MRI scans 4 tablet 0   magic mouthwash (nystatin , diphenhydrAMINE , alum & mag hydroxide) suspension mixture Take 5 mLs by mouth 3 (three) times daily as needed for mouth pain. 240 mL 1   methocarbamol  (ROBAXIN ) 500 MG tablet Take 2 tablets (1,000 mg total) by mouth every 8 (eight) hours as needed. 90 tablet 1   metoprolol  succinate (TOPROL -XL) 25 MG 24 hr tablet TAKE 1/2 TABLET BY MOUTH DAILY 45 tablet 0   nitroGLYCERIN  (NITROSTAT ) 0.4 MG SL tablet Place 1 tablet (0.4 mg total) under the tongue every 5 (five) minutes as needed for chest pain (trouble swallowing). 25 tablet 1   nystatin  (MYCOSTATIN ) 100000 UNIT/ML suspension Take by mouth.     ondansetron  (ZOFRAN ) 8 MG tablet Take 1 tablet (8 mg total) by mouth every 8 (eight) hours as needed for nausea or vomiting. Start on the third day after carboplatin . 30 tablet 1   oxyCODONE -acetaminophen  (PERCOCET/ROXICET) 5-325 MG tablet Take 1 tablet by mouth every 8 (eight) hours as needed for severe pain (pain score 7-10). 45 tablet 0   potassium chloride  SA (KLOR-CON  M) 20 MEQ tablet Take 1 tablet (20 mEq total) by mouth daily.  6 tablet 0   predniSONE (DELTASONE) 50 MG  tablet Take one tablet 13 hours before , one tablet 7 hours before and one tablet one hour prior to CT scan. Take benadryl  50 mg one hour prior to scan 3 tablet 0   prochlorperazine  (COMPAZINE ) 10 MG tablet Take 1 tablet (10 mg total) by mouth every 6 (six) hours as needed for nausea or vomiting. 30 tablet 1   sulfamethoxazole -trimethoprim  (BACTRIM  DS) 800-160 MG tablet Take 1 tablet by mouth 3 (three) times a week. 36 tablet 3   amLODipine  (NORVASC ) 5 MG tablet Take 1 tablet (5 mg total) by mouth daily. (Patient not taking: Reported on 09/03/2024) 90 tablet 1   No current facility-administered medications for this encounter.     REVIEW OF SYSTEMS: On review of systems, the patient reports that he is doing okay.  He is concerned about pain in the right neck just below the level of his jaw that he feels has becoming increasingly more noticeable and uncomfortable.  He states that he is also continuing to have low back pain and is hopeful that his radiation treatment he is currently receiving will be helpful to improve this.  He is having some watery eyes, difficulty with coordination at times, but no complaints of frank headaches or nausea.  He states he just does not quite feel himself.  No other neurologic complaints are verbalized.  PHYSICAL EXAM:  Wt Readings from Last 3 Encounters:  09/03/24 191 lb (86.6 kg)  08/15/24 187 lb 12.8 oz (85.2 kg)  07/25/24 196 lb 12.8 oz (89.3 kg)   Temp Readings from Last 3 Encounters:  09/03/24 98.6 F (37 C)  08/15/24 98.3 F (36.8 C) (Temporal)  07/25/24 97.9 F (36.6 C)   BP Readings from Last 3 Encounters:  09/03/24 135/83  08/15/24 (!) 142/78  07/25/24 (!) 144/92   Pulse Readings from Last 3 Encounters:  09/03/24 93  08/15/24 90  07/25/24 (!) 124   Pain Assessment Pain Score: 3  Pain Loc: Back (neck and back.)/10  In general this is a well appearing African-American male in no acute distress, he is alert and oriented x 4 and appropriate  throughout the examination.  Full examination of head and neck is limited due to the encounter type.   ECOG = 1  0 - Asymptomatic (Fully active, able to carry on all predisease activities without restriction)  1 - Symptomatic but completely ambulatory (Restricted in physically strenuous activity but ambulatory and able to carry out work of a light or sedentary nature. For example, light housework, office work)  2 - Symptomatic, <50% in bed during the day (Ambulatory and capable of all self care but unable to carry out any work activities. Up and about more than 50% of waking hours)  3 - Symptomatic, >50% in bed, but not bedbound (Capable of only limited self-care, confined to bed or chair 50% or more of waking hours)  4 - Bedbound (Completely disabled. Cannot carry on any self-care. Totally confined to bed or chair)  5 - Death   Raylene MM, Creech RH, Tormey DC, et al. [phone]). Toxicity and response criteria of the [facility]. Am. DOROTHA Bridges. Oncol. 5 (6): 649-55    LABORATORY DATA:  Lab Results  Component Value Date   WBC 4.7 08/29/2024   HGB 8.2 (L) 08/29/2024   HCT 24.8 (L) 08/29/2024   MCV 84.1 08/29/2024   PLT 145 (L) 08/29/2024   Lab Results  Component Value Date  NA 140 08/29/2024   K 3.2 (L) 08/29/2024   CL 107 08/29/2024   CO2 25 08/29/2024   Lab Results  Component Value Date   ALT 7 08/29/2024   AST 19 08/29/2024   ALKPHOS 55 08/29/2024   BILITOT 0.3 08/29/2024        IMPRESSION/PLAN: 1. Stage IV, cT2bN3M1c, NSCLC, adenocarcinoma of the RUL with new brain disease.  I spent time today discussing the results of the patient's recent imaging and our current treatment plan.  He is receiving palliative radiation to L2, and his case has been discussed in multidisciplinary brain and spine conference, it is recommended that he have a single fraction of stereotactic radiosurgery to his pineal lesion, as well as evaluation for vertebral augmentation.   After speaking with Dr. Lanis again, he does not perform sacroplasties so he recommended that the patient proceed with a evaluation with interventional radiology to pursue augmentation.  Written consent was obtained today for stereotactic radiosurgery.  We outlined the role for Dr. Lanis and Dr. Dewey to work together for his treatment planning and delivery.  He will come on Thursday for simulation and receive his treatment next week on Friday.  He will otherwise continue his daily radiation to his L-spine.  I will order the MRI pelvis that was recommended to further evaluate the sacrum so he can get set up to see interventional radiology. 2. Right superficial neck pain.  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  We talked about evaluation with his PCP and/or ENT and a referral was placed to be evaluated by ear nose and throat.  The patient is also aware that I will reach out to his medical oncology team to see if this is something that they could make recommendations on and/or whether or not this is related to his systemic therapy.  We will follow this expectantly.  In a visit lasting 45 minutes, greater than 50% of the time was spent face to face via [employer] teams while the patient was in our clinic, I was remotely accessing and providing our discussion from home.  Discussing the patient's condition, in preparation for the discussion, and coordinating the patient's care.       Donald KYM Husband, [facility]   **Disclaimer: This note was dictated with voice recognition software. Similar sounding words can inadvertently be transcribed and this note may contain transcription errors which may not have been corrected upon publication of note.**

## 2024-09-03 NOTE — Telephone Encounter (Signed)
 Contacted [facility] on [address]. Verified yes referral was received and that referral coordinator would be notified. Will f/u later this week to make sure pt is scheduled.

## 2024-09-03 NOTE — Progress Notes (Signed)
 Received message from radiation nurse stating that patient had some questions. Met with patient in the lobby; patient reported a "knot" on the right upper jaw area that appeared over the past few days and is very painful. Patient denies warmth or redness at the site.  Spoke with Dr. Sherrod regarding patient's concern. Per Dr. Sherrod, patient previously had a biopsy of the right parotid mass on 05/03/2024, which revealed a benign salivary gland. Dr. Sherrod recommends use of mouthwash and application of heat to the area to help reduce inflammation. He noted that the salivary gland may flare up periodically. Patient may take over-the-counter Tylenol  as needed for pain. Patient was advised to follow up with dentist as needed. Patient verbalized understanding.

## 2024-09-03 NOTE — Telephone Encounter (Signed)
 Contacted patient. No answer, unable to leave VM. Patient has an appointment scheduled for 12/16 with Dr. Newlin.

## 2024-09-03 NOTE — Telephone Encounter (Signed)
 Copied from CRM [id number]. Topic: Appointments - Scheduling Inquiry for Clinic >> Sep 03, 2024  2:25 PM Darshell M wrote:  Reason for CRM: Patient scheduled for first available in January for parotid tumor. Needs to be seen much sooner. Added to waitlist.

## 2024-09-04 ENCOUNTER — Other Ambulatory Visit (HOSPITAL_COMMUNITY): Payer: Self-pay

## 2024-09-04 ENCOUNTER — Ambulatory Visit
Admission: RE | Admit: 2024-09-04 | Discharge: 2024-09-04 | Disposition: A | Discharge status: Home / Self Care | Source: Ambulatory Visit | Attending: Radiation Oncology | Admitting: Radiation Oncology

## 2024-09-04 ENCOUNTER — Other Ambulatory Visit: Payer: Self-pay

## 2024-09-04 ENCOUNTER — Other Ambulatory Visit

## 2024-09-04 DIAGNOSIS — Z5112 Encounter for antineoplastic immunotherapy: Secondary | ICD-10-CM | POA: Diagnosis not present

## 2024-09-04 DIAGNOSIS — R6889 Other general symptoms and signs: Secondary | ICD-10-CM | POA: Diagnosis not present

## 2024-09-04 LAB — RAD ONC ARIA SESSION SUMMARY
Course Elapsed Days: 1
Plan Fractions Treated to Date: 2
Plan Prescribed Dose Per Fraction: 3 Gy
Plan Total Fractions Prescribed: 10
Plan Total Prescribed Dose: 30 Gy
Reference Point Dosage Given to Date: 6 Gy
Reference Point Session Dosage Given: 3 Gy
Session Number: 2

## 2024-09-05 ENCOUNTER — Inpatient Hospital Stay

## 2024-09-05 ENCOUNTER — Inpatient Hospital Stay: Admitting: Nurse Practitioner

## 2024-09-05 ENCOUNTER — Inpatient Hospital Stay (HOSPITAL_BASED_OUTPATIENT_CLINIC_OR_DEPARTMENT_OTHER): Admitting: Internal Medicine

## 2024-09-05 ENCOUNTER — Encounter: Payer: Self-pay | Admitting: Internal Medicine

## 2024-09-05 ENCOUNTER — Encounter: Payer: Self-pay | Admitting: Nurse Practitioner

## 2024-09-05 ENCOUNTER — Other Ambulatory Visit: Payer: Self-pay

## 2024-09-05 ENCOUNTER — Ambulatory Visit (HOSPITAL_COMMUNITY)

## 2024-09-05 ENCOUNTER — Ambulatory Visit
Admission: RE | Admit: 2024-09-05 | Discharge: 2024-09-05 | Disposition: A | Discharge status: Home / Self Care | Source: Ambulatory Visit | Attending: Radiation Oncology | Admitting: Radiation Oncology

## 2024-09-05 VITALS — BP 138/78 | HR 100 | Temp 97.7°F | Resp 17 | Ht 73.0 in | Wt 187.0 lb

## 2024-09-05 DIAGNOSIS — R530 Neoplastic (malignant) related fatigue: Secondary | ICD-10-CM | POA: Diagnosis not present

## 2024-09-05 DIAGNOSIS — Z515 Encounter for palliative care: Secondary | ICD-10-CM | POA: Diagnosis not present

## 2024-09-05 DIAGNOSIS — G893 Neoplasm related pain (acute) (chronic): Secondary | ICD-10-CM

## 2024-09-05 DIAGNOSIS — Z5112 Encounter for antineoplastic immunotherapy: Secondary | ICD-10-CM | POA: Diagnosis not present

## 2024-09-05 DIAGNOSIS — C3491 Malignant neoplasm of unspecified part of right bronchus or lung: Secondary | ICD-10-CM

## 2024-09-05 DIAGNOSIS — R630 Anorexia: Secondary | ICD-10-CM

## 2024-09-05 DIAGNOSIS — D709 Neutropenia, unspecified: Secondary | ICD-10-CM

## 2024-09-05 DIAGNOSIS — C3490 Malignant neoplasm of unspecified part of unspecified bronchus or lung: Secondary | ICD-10-CM

## 2024-09-05 DIAGNOSIS — R6889 Other general symptoms and signs: Secondary | ICD-10-CM | POA: Diagnosis not present

## 2024-09-05 LAB — RAD ONC ARIA SESSION SUMMARY
Course Elapsed Days: 2
Plan Fractions Treated to Date: 3
Plan Prescribed Dose Per Fraction: 3 Gy
Plan Total Fractions Prescribed: 10
Plan Total Prescribed Dose: 30 Gy
Reference Point Dosage Given to Date: 9 Gy
Reference Point Session Dosage Given: 3 Gy
Session Number: 3

## 2024-09-05 LAB — CMP (CANCER CENTER ONLY)
ALT: 6 U/L (ref 0–44)
AST: 18 U/L (ref 15–41)
Albumin: 3.7 g/dL (ref 3.5–5.0)
Alkaline Phosphatase: 58 U/L (ref 38–126)
Anion gap: 8 (ref 5–15)
BUN: 17 mg/dL (ref 6–20)
CO2: 26 mmol/L (ref 22–32)
Calcium: 9.1 mg/dL (ref 8.9–10.3)
Chloride: 105 mmol/L (ref 98–111)
Creatinine: 1.19 mg/dL (ref 0.61–1.24)
GFR, Estimated: >60 mL/min (ref >60)
Glucose, Bld: 86 mg/dL (ref 70–99)
Potassium: 3.5 mmol/L (ref 3.5–5.1)
Sodium: 139 mmol/L (ref 135–145)
Total Bilirubin: 0.4 mg/dL (ref 0.0–1.2)
Total Protein: 7.9 g/dL (ref 6.5–8.1)

## 2024-09-05 LAB — CBC WITH DIFFERENTIAL (CANCER CENTER ONLY)
Abs Immature Granulocytes: 0.01 K/uL (ref 0.00–0.07)
Basophils Absolute: 0 K/uL (ref 0.0–0.1)
Basophils Relative: 0 %
Eosinophils Absolute: 0.1 K/uL (ref 0.0–0.5)
Eosinophils Relative: 3 %
HCT: 25.8 % — ABNORMAL LOW (ref 39.0–52.0)
Hemoglobin: 8.4 g/dL — ABNORMAL LOW (ref 13.0–17.0)
Immature Granulocytes: 0 %
Lymphocytes Relative: 32 %
Lymphs Abs: 1.6 K/uL (ref 0.7–4.0)
MCH: 27.5 pg (ref 26.0–34.0)
MCHC: 32.6 g/dL (ref 30.0–36.0)
MCV: 84.3 fL (ref 80.0–100.0)
Monocytes Absolute: 0.5 K/uL (ref 0.1–1.0)
Monocytes Relative: 9 %
Neutro Abs: 2.8 K/uL (ref 1.7–7.7)
Neutrophils Relative %: 56 %
Platelet Count: 147 K/uL — ABNORMAL LOW (ref 150–400)
RBC: 3.06 MIL/uL — ABNORMAL LOW (ref 4.22–5.81)
RDW: 18.5 % — ABNORMAL HIGH (ref 11.5–15.5)
WBC Count: 5 K/uL (ref 4.0–10.5)
nRBC: 0 % (ref 0.0–0.2)

## 2024-09-05 MED ORDER — PALONOSETRON HCL INJECTION 0.25 MG/5ML
0.2500 mg | Freq: Once | INTRAVENOUS | Status: AC
  Administered 2024-09-05: 0.25 mg via INTRAVENOUS
  Filled 2024-09-05: qty 5

## 2024-09-05 MED ORDER — SODIUM CHLORIDE 0.9 % IV SOLN: INTRAVENOUS | Status: DC

## 2024-09-05 MED ORDER — PROCHLORPERAZINE MALEATE 10 MG PO TABS
10.0000 mg | ORAL_TABLET | Freq: Once | ORAL | Status: AC
  Administered 2024-09-05: 10 mg via ORAL
  Filled 2024-09-05: qty 1

## 2024-09-05 MED ORDER — SODIUM CHLORIDE 0.9 % IV SOLN
400.0000 mg/m2 | Freq: Once | INTRAVENOUS | Status: AC
  Administered 2024-09-05: 800 mg via INTRAVENOUS
  Filled 2024-09-05: qty 20

## 2024-09-05 MED ORDER — DENOSUMAB 120 MG/1.7ML ~~LOC~~ SOLN: 120.0000 mg | Freq: Once | SUBCUTANEOUS | Status: DC

## 2024-09-05 MED ORDER — OXYCODONE HCL 5 MG PO TABS
5.0000 mg | ORAL_TABLET | Freq: Once | ORAL | Status: AC
  Administered 2024-09-05: 5 mg via ORAL
  Filled 2024-09-05: qty 1

## 2024-09-05 MED ORDER — DENOSUMAB 120 MG/1.7ML ~~LOC~~ SOLN
120.0000 mg | Freq: Once | SUBCUTANEOUS | Status: AC
  Administered 2024-09-05: 120 mg via SUBCUTANEOUS
  Filled 2024-09-05: qty 1.7

## 2024-09-05 MED ORDER — DEXAMETHASONE SOD PHOSPHATE PF 10 MG/ML IJ SOLN
10.0000 mg | Freq: Once | INTRAMUSCULAR | Status: AC
  Administered 2024-09-05: 10 mg via INTRAVENOUS

## 2024-09-05 MED ORDER — SODIUM CHLORIDE 0.9 % IV SOLN
350.0000 mg | Freq: Once | INTRAVENOUS | Status: AC
  Administered 2024-09-05: 350 mg via INTRAVENOUS
  Filled 2024-09-05: qty 7

## 2024-09-05 NOTE — Patient Instructions (Signed)
 [facility] - A DEPT OF [facility]  Discharge Instructions: Thank you for choosing [facility] to provide your oncology and hematology care.   If you have a lab appointment with the Cancer Center, please go directly to the Cancer Center and check in at the registration area.   Wear comfortable clothing and clothing appropriate for easy access to any Portacath or PICC line.   We strive to give you quality time with your provider. You may need to reschedule your appointment if you arrive late (15 or more minutes).  Arriving late affects you and other patients whose appointments are after yours.  Also, if you miss three or more appointments without notifying the office, you may be dismissed from the clinic at the provider's discretion.      For prescription refill requests, have your pharmacy contact our office and allow 72 hours for refills to be completed.    Today you received the following chemotherapy and/or immunotherapy agents :  Cemiplimab  (Libtayo ), Alimta    To help prevent nausea and vomiting after your treatment, we encourage you to take your nausea medication as directed.  BELOW ARE SYMPTOMS THAT SHOULD BE REPORTED IMMEDIATELY: *FEVER GREATER THAN 100.4 F (38 C) OR HIGHER *CHILLS OR SWEATING *NAUSEA AND VOMITING THAT IS NOT CONTROLLED WITH YOUR NAUSEA MEDICATION *UNUSUAL SHORTNESS OF BREATH *UNUSUAL BRUISING OR BLEEDING *URINARY PROBLEMS (pain or burning when urinating, or frequent urination) *BOWEL PROBLEMS (unusual diarrhea, constipation, pain near the anus) TENDERNESS IN MOUTH AND THROAT WITH OR WITHOUT PRESENCE OF ULCERS (sore throat, sores in mouth, or a toothache) UNUSUAL RASH, SWELLING OR PAIN  UNUSUAL VAGINAL DISCHARGE OR ITCHING   Items with * indicate a potential emergency and should be followed up as soon as possible or go to the Emergency Department if any problems should occur.  Please show the CHEMOTHERAPY ALERT CARD  or IMMUNOTHERAPY ALERT CARD at check-in to the Emergency Department and triage nurse.  Should you have questions after your visit or need to cancel or reschedule your appointment, please contact [facility] - A DEPT OF [facility]  Dept: [phone]  and follow the prompts.  Office hours are 8:00 a.m. to 4:30 p.m. Monday - Friday. Please note that voicemails left after 4:00 p.m. may not be returned until the following business day.  We are closed weekends and major holidays. You have access to a nurse at all times for urgent questions. Please call the main number to the clinic Dept: [phone] and follow the prompts.   For any non-urgent questions, you may also contact your provider using MyChart. We now offer e-Visits for anyone 61 and older to request care online for non-urgent symptoms. For details visit [email or web address].   Also download the MyChart app! Go to the app store, search MyChart, open the app, select [place], and log in with your MyChart username and password.

## 2024-09-05 NOTE — Progress Notes (Signed)
 [facility] [phone]   [phone]  OFFICE PROGRESS NOTE  Benjamin Clam, MD [address] [place] [address] [place] [address]  DIAGNOSIS: Stage IV (T3, N3, M1 C) non-small cell lung cancer, adenocarcinoma presented with large right upper lobe lung mass in addition to right hilar and mediastinal as well as right supraclavicular lymphadenopathy and metastatic bone lesions including the left proximal humerus, L2 as well as sacral area and left adrenal metastasis diagnosed in June 2025.   Molecular studies show PDL1 of 0%   Biomarker Findings Blood Tumor Mutational Burden - 37 Muts/Mb ctDNA Tumor Fraction - High (2.4%) Microsatellite status - MSI-High Not Detected Genomic Findings For a complete list of the genes assayed, please refer to the Appendix. ARID1A E2250* KEAP1 G524C, G487fs*23 CTNNA1 splice site 2192+1G>C DNMT3A R882C TET2 K1197* TP53 splice site 813_[id number], deletion exons 8-9 This assay tested >300 cancer-related genes, including the following 8 gene(s) routinely assessed in this tumor type: ALK, BRAF, EGFR, ERBB2, KRAS, MET, RET, ROS1.     PRIOR THERAPY:  Palliative radiation to the painful bone lesion in the left humerus. Last day on 05/03/24.    CURRENT THERAPY:  Systemic chemotherapy with Carboplatin  for an AUC of 5, Alimta  500 mg/m2, and Libatyo IV every 3 weeks. First dose on 05/02/24. Doses of Alimta  reduced to 400 mg/m2 starting from cycle #2 due to HIV and neutropenia.  Status post 6 cycles.  Starting cycle #5 the patient is on treatment with maintenance Alimta  and Libtayo  (Cempilimab) every 3 weeks  INTERVAL HISTORY: Benjamin Herrera 58 y.o. male returns to the clinic today for follow-up visit. Discussed the use of AI scribe software for clinical note transcription with the patient, who gave verbal consent to proceed.  History of Present Illness Benjamin Herrera is a 58 year old male with stage four non-small cell  lung cancer, adenocarcinoma, who presents for evaluation before starting cycle seven of maintenance treatment.  He was diagnosed with stage four non-small cell lung cancer, adenocarcinoma, in June 2025, characterized by high tumor mutational burden (TMB) but no actionable mutations. Initially, he underwent palliative systemic chemo-immunotherapy with carboplatin , Alimta , and Libtayo  every three weeks for four cycles. Currently, he is on maintenance treatment with Alimta  and Libtayo  every three weeks, having completed six cycles.  He experiences intermittent back spasms, describing them as 'real bad' pain. He also has neck pain and a lesion on the right side of his neck, which was previously biopsied and found not to be cancerous. The lesion has recently increased in size.  He has a sore on his knee that causes pain and requires cleaning. He describes an incident where he fell and noticed a smell when wiping the area, indicating a need for better hygiene and protection of the sore.  He has undergone an MRI of the brain and lumbar region. A lesion in his head has been getting bigger, which was discussed in a tumor board.    MEDICAL HISTORY: Past Medical History:  Diagnosis Date   Achalasia and cardiospasm    Allergy    Anxiety    Arthritis    back ,shoulders    Asthma    Boils    under arms hx of   CAD (coronary artery disease)    a. 09/2016: cath showing 100% RCA occlusion with collaterals and 90% OM1 (treated with DES)   Candidiasis of mouth    Candidiasis of unspecified site    Cholelithiasis  Cocaine abuse (HCC)    Quit 09/2016   Condyloma acuminatum in male of scrotum & anal canal s/p laser ablation 07/03/2012   Coronary artery disease    Dysphagia    Fracture, humerus    Frozen shoulder    left   GERD (gastroesophageal reflux disease)    Heart attack (HCC)    Hemorrhoids    Hepatitis 1993   A   Herpes labialis    HIV (human immunodeficiency virus infection) (HCC) DX  1993   Hypertension Dx 2014   off all bp meds for last 9 or 10 months   Pilonidal disease 01/10/2012   Polysubstance abuse (HCC)    Psoriasis    hx of   Sleep apnea    does  have CPAP   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  MEDICATIONS:  Current Outpatient Medications  Medication Sig Dispense Refill   acetaminophen  (TYLENOL ) 325 MG tablet Take 650 mg by mouth every 6 (six) hours as needed for mild pain (pain score 1-3), moderate pain (pain score 4-6) or headache.     albuterol  (VENTOLIN  HFA) 108 (90 Base) MCG/ACT inhaler Inhale 2 puffs into the lungs every 6 (six) hours as needed for wheezing or shortness of breath. 1 each 2   amLODipine  (NORVASC ) 5 MG tablet Take 1 tablet (5 mg total) by mouth daily. (Patient not taking: Reported on 09/03/2024) 90 tablet 1   amoxicillin -clavulanate (AUGMENTIN ) 875-125 MG tablet Take 1 tablet by mouth 2 (two) times daily. 20 tablet 0   aspirin  81 MG chewable tablet Chew 1 tablet (81 mg total) by mouth daily. 90 tablet 3   Bictegravir-Emtricitab-Tenofov (BIKTARVY ) 30-120-15 MG TABS Take 1 tablet by mouth daily. 90 tablet 3   Blood Pressure KIT 1 kit by Does not apply route daily. 1 kit 0   diclofenac  Sodium (VOLTAREN  ARTHRITIS PAIN) 1 % GEL Apply 2 g topically 4 (four) times daily. 100 g 3   folic acid  (FOLVITE ) 1 MG tablet Take 1 tablet (1 mg total) by mouth daily. Start 7 days before pemetrexed  chemotherapy. Continue until 21 days after pemetrexed  completed. 30 tablet 3   hydrOXYzine  (ATARAX ) 25 MG tablet Take 1 tablet (25 mg total) by mouth 3 (three) times daily as needed. 30 tablet 0   lidocaine  (XYLOCAINE ) 2 % solution SWISH AND SWALLOW 5 MLS 3 (THREE) TIMES DAILY AS NEEDED FOR MOUTH PAIN. 360 mL 1   lidocaine -prilocaine  (EMLA ) cream Apply to affected area once 30 g 3   LORazepam  (ATIVAN )  0.5 MG tablet TAKE 1 TAB BY MOUTH 30 MINUTES PRIOR TO RADIATION OR MRI SCANS 4 tablet 0   LORazepam  (ATIVAN ) 0.5 MG tablet 1 tab po 30 minutes prior to radiation or MRI scans 4 tablet 0   magic mouthwash (nystatin , diphenhydrAMINE , alum & mag hydroxide) suspension mixture Take 5 mLs by mouth 3 (three) times daily as needed for mouth pain. 240 mL 1   methocarbamol  (ROBAXIN ) 500 MG tablet Take 2 tablets (1,000 mg total) by mouth every 8 (eight) hours as needed. 90 tablet 1   metoprolol  succinate (TOPROL -XL) 25 MG 24 hr tablet TAKE 1/2 TABLET BY MOUTH DAILY 45 tablet 0   nitroGLYCERIN  (NITROSTAT ) 0.4 MG SL tablet Place 1 tablet (0.4 mg total) under the tongue every 5 (five) minutes as needed for chest pain (trouble swallowing).  25 tablet 1   nystatin  (MYCOSTATIN ) 100000 UNIT/ML suspension Take by mouth.     ondansetron  (ZOFRAN ) 8 MG tablet Take 1 tablet (8 mg total) by mouth every 8 (eight) hours as needed for nausea or vomiting. Start on the third day after carboplatin . 30 tablet 1   oxyCODONE -acetaminophen  (PERCOCET/ROXICET) 5-325 MG tablet Take 1 tablet by mouth every 8 (eight) hours as needed for severe pain (pain score 7-10). 45 tablet 0   potassium chloride  SA (KLOR-CON  M) 20 MEQ tablet Take 1 tablet (20 mEq total) by mouth daily. 6 tablet 0   predniSONE (DELTASONE) 50 MG tablet Take one tablet 13 hours before , one tablet 7 hours before and one tablet one hour prior to CT scan. Take benadryl  50 mg one hour prior to scan 3 tablet 0   prochlorperazine  (COMPAZINE ) 10 MG tablet Take 1 tablet (10 mg total) by mouth every 6 (six) hours as needed for nausea or vomiting. 30 tablet 1   sulfamethoxazole -trimethoprim  (BACTRIM  DS) 800-160 MG tablet Take 1 tablet by mouth 3 (three) times a week. 36 tablet 3   No current facility-administered medications for this visit.    SURGICAL HISTORY:  Past Surgical History:  Procedure Laterality Date   BALLOON DILATION N/A 11/11/2015   Procedure: BALLOON DILATION;   Surgeon: Gustav Shila GAILS, MD;  Location: [facility];  Service: Endoscopy;  Laterality: N/A;   BOTOX  INJECTION  10/11/2012   Procedure: BOTOX  INJECTION;  Surgeon: Lamar JONETTA Aho, MD;  Location: [facility];  Service: Endoscopy;  Laterality: N/A;   BOTOX  INJECTION N/A 01/25/2013   Procedure: BOTOX  INJECTION;  Surgeon: Lamar JONETTA Aho, MD;  Location: [facility];  Service: Endoscopy;  Laterality: N/A;   BOTOX  INJECTION N/A 06/21/2013   Procedure: BOTOX  INJECTION;  Surgeon: Lamar JONETTA Aho, MD;  Location: [facility];  Service: Endoscopy;  Laterality: N/A;   BOTOX  INJECTION N/A 10/08/2013   Procedure: BOTOX  INJECTION;  Surgeon: Lamar JONETTA Aho, MD;  Location: [facility];  Service: Endoscopy;  Laterality: N/A;   BOTOX  INJECTION N/A 06/16/2015   Procedure: BOTOX  INJECTION;  Surgeon: Lamar JONETTA Aho, MD;  Location: [facility];  Service: Endoscopy;  Laterality: N/A;   BRONCHIAL BIOPSY  04/03/2024   Procedure: BRONCHOSCOPY, WITH BIOPSY;  Surgeon: Shelah Lamar RAMAN, MD;  Location: [facility];  Service: Pulmonary;;   BRONCHIAL BRUSHINGS  04/03/2024   Procedure: BRONCHOSCOPY, WITH BRUSH BIOPSY;  Surgeon: Shelah Lamar RAMAN, MD;  Location: [facility];  Service: Pulmonary;;   BRONCHIAL NEEDLE ASPIRATION BIOPSY  04/03/2024   Procedure: BRONCHOSCOPY, WITH NEEDLE ASPIRATION BIOPSY;  Surgeon: Shelah Lamar RAMAN, MD;  Location: [facility];  Service: Pulmonary;;   BRONCHOSCOPY, WITH BIOPSY USING ELECTROMAGNETIC NAVIGATION Right 04/03/2024   Procedure: BRONCHOSCOPY, WITH BIOPSY USING ELECTROMAGNETIC NAVIGATION;  Surgeon: Shelah Lamar RAMAN, MD;  Location: [facility];  Service: Pulmonary;  Laterality: Right;   CARDIAC CATHETERIZATION N/A 10/11/2016   Procedure: Left Heart Cath and Coronary Angiography;  Surgeon: Lonni JONETTA Cash, MD;  mRCA 100% w/ L>R collaterals, OM1 90%   CARDIAC CATHETERIZATION N/A 10/11/2016   Procedure: Coronary Stent Intervention;  Surgeon: Lonni JONETTA Cash, MD;  RESOLUTE ONYX  3.5X15 DES OM1   COLONOSCOPY  05/19/2012   Procedure: COLONOSCOPY;  Surgeon: Lamar JONETTA Aho, MD;  Location: [facility];  Service: Endoscopy;  Laterality: N/A;  jill trying to contact pt to come in 0830 for 930 case, phone not accepting messages   COLONOSCOPY     ESOPHAGEAL MANOMETRY  05/29/2012   Procedure: ESOPHAGEAL MANOMETRY (  EM);  Surgeon: Lamar JONETTA Aho, MD;  Location: [facility];  Service: Endoscopy;  Laterality: N/A;   ESOPHAGEAL MANOMETRY N/A 10/06/2015   Procedure: ESOPHAGEAL MANOMETRY (EM);  Surgeon: Gustav Shila GAILS, MD;  Location: [facility];  Service: Endoscopy;  Laterality: N/A;   ESOPHAGOGASTRODUODENOSCOPY  11/11/2011   Procedure: ESOPHAGOGASTRODUODENOSCOPY (EGD);  Surgeon: Lamar JONETTA Aho, MD;  Location: [facility];  Service: Endoscopy;  Laterality: N/A;  botox  injection  called Pt to change time of procedure per Dr Aho   ESOPHAGOGASTRODUODENOSCOPY  03/10/2012   Procedure: ESOPHAGOGASTRODUODENOSCOPY (EGD);  Surgeon: Lamar JONETTA Aho, MD;  Location: [facility];  Service: Endoscopy;  Laterality: N/A;   ESOPHAGOGASTRODUODENOSCOPY  10/11/2012   Procedure: ESOPHAGOGASTRODUODENOSCOPY (EGD);  Surgeon: Lamar JONETTA Aho, MD;  Location: [facility];  Service: Endoscopy;  Laterality: N/A;   ESOPHAGOGASTRODUODENOSCOPY N/A 01/25/2013   Procedure: ESOPHAGOGASTRODUODENOSCOPY (EGD);  Surgeon: Lamar JONETTA Aho, MD;  Location: [facility];  Service: Endoscopy;  Laterality: N/A;   ESOPHAGOGASTRODUODENOSCOPY N/A 06/21/2013   Procedure: ESOPHAGOGASTRODUODENOSCOPY (EGD);  Surgeon: Lamar JONETTA Aho, MD;  Location: [facility];  Service: Endoscopy;  Laterality: N/A;   ESOPHAGOGASTRODUODENOSCOPY (EGD) WITH PROPOFOL  N/A 10/08/2013   Procedure: ESOPHAGOGASTRODUODENOSCOPY (EGD) WITH PROPOFOL ;  Surgeon: Lamar JONETTA Aho, MD;  Location: [facility];  Service: Endoscopy;  Laterality: N/A;   ESOPHAGOGASTRODUODENOSCOPY (EGD) WITH PROPOFOL  N/A 06/16/2015   Procedure: ESOPHAGOGASTRODUODENOSCOPY (EGD) WITH  PROPOFOL ;  Surgeon: Lamar JONETTA Aho, MD;  Location: [facility];  Service: Endoscopy;  Laterality: N/A;   ESOPHAGOGASTRODUODENOSCOPY (EGD) WITH PROPOFOL  N/A 11/11/2015   Procedure: ESOPHAGOGASTRODUODENOSCOPY (EGD) WITH PROPOFOL ;  Surgeon: Gustav Shila GAILS, MD;  Location: [facility];  Service: Endoscopy;  Laterality: N/A;   EXAMINATION UNDER ANESTHESIA  07/24/2012   Procedure: EXAM UNDER ANESTHESIA;  Surgeon: Elspeth KYM Schultze, MD;  Location: [facility];  Service: General;  Laterality: N/A;   humeral fracture surgery Right yrs ago   PILONIDAL CYST EXCISION  07/24/2012   Procedure: CYST EXCISION PILONIDAL SIMPLE;  Surgeon: Elspeth KYM Schultze, MD;  Location: [facility];  Service: General;  Laterality: N/A;  Exam Under Anesthesia,, Excision Pilonidal Disease,    right shoulder replacement  2008   x 2   UPPER GASTROINTESTINAL ENDOSCOPY     WART FULGURATION  07/24/2012   Procedure: FULGURATION ANAL WART;  Surgeon: Elspeth KYM Schultze, MD;  Location: [facility];  Service: General;  Laterality: N/A;  excision of raphe mass    REVIEW OF SYSTEMS:  Constitutional: positive for fatigue Eyes: negative Ears, nose, mouth, throat, and face: negative Respiratory: negative Cardiovascular: negative Gastrointestinal: positive for nausea Genitourinary:negative Integument/breast: negative Hematologic/lymphatic: negative Musculoskeletal:positive for arthralgias Neurological: negative Behavioral/Psych: negative Endocrine: negative Allergic/Immunologic: negative   PHYSICAL EXAMINATION: General appearance: alert, cooperative, fatigued, and no distress Head: Normocephalic, without obvious abnormality, atraumatic Neck: no adenopathy, no JVD, supple, symmetrical, trachea midline, and thyroid  not enlarged, symmetric, no tenderness/mass/nodules Lymph nodes: Cervical, supraclavicular, and axillary nodes normal. Resp: clear to auscultation bilaterally Back: symmetric, no curvature. ROM normal. No CVA tenderness. Cardio: regular rate  and rhythm, S1, S2 normal, no murmur, click, rub or gallop GI: soft, non-tender; bowel sounds normal; no masses,  no organomegaly Extremities: extremities normal, atraumatic, no cyanosis or edema Neurologic: Alert and oriented X 3, normal strength and tone. Normal symmetric reflexes. Normal coordination and gait  ECOG PERFORMANCE STATUS: 1 - Symptomatic but completely ambulatory  Blood pressure 138/78, pulse 100, temperature 97.7 F (36.5 C), temperature source Temporal, resp. rate 17, height 6' 1 (1.854 m), weight 187 lb (84.8 kg), SpO2 100%.  LABORATORY DATA: Lab Results  Component Value Date   WBC 5.0 09/05/2024   HGB 8.4 (L) 09/05/2024   HCT 25.8 (L) 09/05/2024   MCV 84.3 09/05/2024   PLT 147 (L) 09/05/2024      Chemistry      Component Value Date/Time   NA 140 09/03/2024 1450   NA 136 06/19/2024 1445   K 3.6 09/03/2024 1450   CL 106 09/03/2024 1450   CO2 26 09/03/2024 1450   BUN 13 09/03/2024 1450   BUN 20 06/19/2024 1445   CREATININE 1.22 09/03/2024 1450   CREATININE 0.96 07/30/2020 1437      Component Value Date/Time   CALCIUM  9.6 09/03/2024 1450   ALKPHOS 60 09/03/2024 1450   AST 18 09/03/2024 1450   ALT 7 09/03/2024 1450   BILITOT 0.4 09/03/2024 1450       RADIOGRAPHIC STUDIES:   ASSESSMENT AND PLAN: This is a 58 years old African-American male with Stage IV (T3, N3, M1 C) non-small cell lung cancer, adenocarcinoma presented with large right upper lobe lung mass in addition to right hilar and mediastinal as well as right supraclavicular lymphadenopathy and metastatic bone lesions including the left proximal humerus, L2 as well as sacral area and left adrenal metastasis diagnosed in June 2025. Molecular studies showed no actionable mutations and has negative PD-L1 expression but high TMB He is currently undergoing systemic chemotherapy with reduced dose carboplatin  for AUC of 4 and pemetrexed  400 Mg/M2 in addition to Libtayo  (Cempilimab) 350 Mg IV every 3  weeks.  Status post 6 cycles.  Starting cycle #5 he is on maintenance treatment with Alimta  and Libtayo  every 3 weeks.  Starting from cycle #5 he has been on maintenance treatment with Alimta  and Libtayo  (Cempilimab) every 3 weeks. He is also on treatment with Xgeva  for bone metastasis. Assessment and Plan Assessment & Plan Stage IV non-small cell lung adenocarcinoma on maintenance chemoimmunotherapy Stage IV non-small cell lung adenocarcinoma, diagnosed in June 2025, with high TMB and no actionable mutations. Currently on maintenance treatment with Alimta  and Libtayo  every three weeks. Scheduled for cycle seven of treatment. Recent MRI brain and lumbar spine performed, but no recent scan of the chest, abdomen, and pelvis. - Scheduled scan of chest, abdomen, and pelvis in two weeks - Scheduled follow-up appointment in three weeks - Coordinated with treatment team to determine if cycle seven can be administered today or needs rescheduling  Benign parotid gland lesion, post-biopsy Benign parotid gland lesion confirmed by biopsy. Lesion has increased in size but is non-cancerous. - Continue use of prescribed mouthwash to clean salivary duct  Back pain with muscle spasms Intermittent back pain with muscle spasms reported.  Right neck pain and swelling, non-malignant Right neck pain and swelling, non-malignant. Biopsy confirmed non-cancerous lesion.  Superficial knee skin lesion Superficial skin lesion on knee with odor, requiring cleaning. - Clean lesion with baby wipes to prevent infection He was advised to call immediately if he has any concerning symptoms in the interval.  The patient voices understanding of current disease status and treatment options and is in agreement with the current care plan.  All questions were answered. The patient knows to call the clinic with any problems, questions or concerns. We can certainly see the patient much sooner if necessary.  The total time spent in  the appointment was 30 minutes including review of chart and various tests results, discussions about plan of care and coordination of care plan .  Disclaimer: This note was dictated with  voice recognition software. Similar sounding words can inadvertently be transcribed and may not be corrected upon review.

## 2024-09-05 NOTE — Progress Notes (Signed)
 Palliative Medicine [facility]  Telephone:([phone]) [phone] Fax:(336) [phone]   Name: Benjamin Herrera Date: 09/05/2024 MRN: [id number]  DOB: 1965-12-26  Patient Care Team: [name], MD as PCP - General (Family Medicine) Eben Reyes [name], MD as PCP - Infectious Diseases (Infectious Diseases) Mona Vinie [name], MD as PCP - Cardiology (Cardiology) Debrah Lamar [name], MD (Inactive) as Consulting Physician (Gastroenterology) [name], MD as Consulting Physician (General Surgery) [name], RN as Oncology Nurse Navigator    INTERVAL HISTORY: Benjamin Herrera is a 58 y.o. male with medical problems include: HIV, hypertension, coronary artery disease (hx MI, status post DES to OM1 in 2017), chronic low back pain, SCC of skin, OSA, polysubstance abuse, GERD, asthma, anxiety, arthritis, more recently he was diagnosed with Adenocarcinoma of right lung, stage 4.   SOCIAL HISTORY:     reports that he has quit smoking. His smoking use included cigarettes. He started smoking about 42 years ago. He has a 42.9 pack-year smoking history. He has never used smokeless tobacco. He reports that he does not currently use drugs after having used the following drugs: Marijuana. Frequency: 3.00 times per week. He reports that he does not drink alcohol.  ADVANCE DIRECTIVES:  Full code full scope. Plans to meet with social work to establish advance directives. Referral to social work for this placed at initial visit. Pending completion of forms.   CODE STATUS: Full code  PAST MEDICAL HISTORY: Past Medical History:  Diagnosis Date   Achalasia and cardiospasm    Allergy    Anxiety    Arthritis    back ,shoulders    Asthma    Boils    under arms hx of   CAD (coronary artery disease)    a. 09/2016: cath showing 100% RCA occlusion with collaterals and 90% OM1 (treated with DES)   Candidiasis of mouth    Candidiasis of unspecified site    Cholelithiasis    Cocaine abuse  (HCC)    Quit 09/2016   Condyloma acuminatum in male of scrotum & anal canal s/p laser ablation 07/03/2012   Coronary artery disease    Dysphagia    Fracture, humerus    Frozen shoulder    left   GERD (gastroesophageal reflux disease)    Heart attack (HCC)    Hemorrhoids    Hepatitis 1993   A   Herpes labialis    HIV (human immunodeficiency virus infection) (HCC) DX 1993   Hypertension Dx 2014   off all bp meds for last 9 or 10 months   Pilonidal disease 01/10/2012   Polysubstance abuse (HCC)    Psoriasis    hx of   Sleep apnea    does  have CPAP   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  MEDICATIONS:  Current Outpatient Medications  Medication Sig Dispense Refill   acetaminophen  (TYLENOL ) 325 MG tablet Take 650 mg by mouth every 6 (six) hours as needed for mild pain (pain score 1-3), moderate pain (pain score 4-6) or headache.     albuterol  (VENTOLIN  HFA) 108 (90 Base) MCG/ACT inhaler Inhale 2 puffs into the lungs every 6 (six) hours as needed for wheezing or shortness of breath. 1 each 2   amLODipine  (NORVASC ) 5 MG tablet Take 1 tablet (5 mg total) by mouth daily. (Patient  not taking: Reported on 09/03/2024) 90 tablet 1   amoxicillin -clavulanate (AUGMENTIN ) 875-125 MG tablet Take 1 tablet by mouth 2 (two) times daily. 20 tablet 0   aspirin  81 MG chewable tablet Chew 1 tablet (81 mg total) by mouth daily. 90 tablet 3   Bictegravir-Emtricitab-Tenofov (BIKTARVY ) 30-120-15 MG TABS Take 1 tablet by mouth daily. 90 tablet 3   Blood Pressure KIT 1 kit by Does not apply route daily. 1 kit 0   diclofenac  Sodium (VOLTAREN  ARTHRITIS PAIN) 1 % GEL Apply 2 g topically 4 (four) times daily. 100 g 3   folic acid  (FOLVITE ) 1 MG tablet Take 1 tablet (1 mg total) by mouth daily. Start 7 days before pemetrexed  chemotherapy. Continue until  21 days after pemetrexed  completed. 30 tablet 3   hydrOXYzine  (ATARAX ) 25 MG tablet Take 1 tablet (25 mg total) by mouth 3 (three) times daily as needed. 30 tablet 0   lidocaine  (XYLOCAINE ) 2 % solution SWISH AND SWALLOW 5 MLS 3 (THREE) TIMES DAILY AS NEEDED FOR MOUTH PAIN. 360 mL 1   lidocaine -prilocaine  (EMLA ) cream Apply to affected area once 30 g 3   LORazepam  (ATIVAN ) 0.5 MG tablet TAKE 1 TAB BY MOUTH 30 MINUTES PRIOR TO RADIATION OR MRI SCANS 4 tablet 0   LORazepam  (ATIVAN ) 0.5 MG tablet 1 tab po 30 minutes prior to radiation or MRI scans 4 tablet 0   magic mouthwash (nystatin , diphenhydrAMINE , alum & mag hydroxide) suspension mixture Take 5 mLs by mouth 3 (three) times daily as needed for mouth pain. 240 mL 1   methocarbamol  (ROBAXIN ) 500 MG tablet Take 2 tablets (1,000 mg total) by mouth every 8 (eight) hours as needed. 90 tablet 1   metoprolol  succinate (TOPROL -XL) 25 MG 24 hr tablet TAKE 1/2 TABLET BY MOUTH DAILY 45 tablet 0   nitroGLYCERIN  (NITROSTAT ) 0.4 MG SL tablet Place 1 tablet (0.4 mg total) under the tongue every 5 (five) minutes as needed for chest pain (trouble swallowing). 25 tablet 1   nystatin  (MYCOSTATIN ) 100000 UNIT/ML suspension Take by mouth.     ondansetron  (ZOFRAN ) 8 MG tablet Take 1 tablet (8 mg total) by mouth every 8 (eight) hours as needed for nausea or vomiting. Start on the third day after carboplatin . 30 tablet 1   oxyCODONE -acetaminophen  (PERCOCET/ROXICET) 5-325 MG tablet Take 1 tablet by mouth every 8 (eight) hours as needed for severe pain (pain score 7-10). 45 tablet 0   potassium chloride  SA (KLOR-CON  M) 20 MEQ tablet Take 1 tablet (20 mEq total) by mouth daily. 6 tablet 0   predniSONE (DELTASONE) 50 MG tablet Take one tablet 13 hours before , one tablet 7 hours before and one tablet one hour prior to CT scan. Take benadryl  50 mg one hour prior to scan 3 tablet 0   prochlorperazine  (COMPAZINE ) 10 MG tablet Take 1 tablet (10 mg total) by mouth every 6 (six)  hours as needed for nausea or vomiting. 30 tablet 1   sulfamethoxazole -trimethoprim  (BACTRIM  DS) 800-160 MG tablet Take 1 tablet by mouth 3 (three) times a week. 36 tablet 3   No current facility-administered medications for this visit.   Facility-Administered Medications Ordered in Other Visits  Medication Dose Route Frequency Provider Last Rate Last Admin   0.9 %  sodium chloride  infusion   Intravenous Continuous Sherrod Sherrod, MD 10 mL/hr at 09/05/24 1021 New Bag at 09/05/24 1021   cemiplimab -rwlc (LIBTAYO ) 350 mg in sodium chloride  0.9 % 100 mL chemo infusion  350 mg Intravenous Once Mohamed,  Mohamed, MD 214 mL/hr at 09/05/24 1202 350 mg at 09/05/24 1202   denosumab  (XGEVA ) injection 120 mg  120 mg Subcutaneous Once Heilingoetter, Cassandra L, PA-C        VITAL SIGNS: There were no vitals taken for this visit. There were no vitals filed for this visit.  Estimated body mass index is 24.67 kg/m as calculated from the following:   Height as of an earlier encounter on 09/05/24: 6' 1 (1.854 m).   Weight as of an earlier encounter on 09/05/24: 187 lb (84.8 kg).  PERFORMANCE STATUS (ECOG) : 1 - Symptomatic but completely ambulatory  Physical Exam General: NAD Cardiovascular: regular rate and rhythm Pulmonary: normal breathing pattern Extremities: no edema, no joint deformities Skin: no rashes Neurological: AAO x3  IMPRESSION: Discussed the use of AI scribe software for clinical note transcription with the patient, who gave verbal consent to proceed.  History of Present Illness Benjamin Herrera is a 58 year old male who I saw during infusion for symptom management follow-up.  No acute distress.  No family present.  No issues with nausea, vomiting, constipation or diarrhea. Occasional fatigue, however he remains as active as possible. Appetite fluctuates, some days are better than others. States when he is having pain he has minimal appetite.   He has been experiencing neck pain that  radiates to his back. The swelling in the neck area is red, painful to touch, and appeared suddenly overnight. Initially, it was a small lump before it enlarged significantly. He has discussed with Dr. Sherrod with suggestions for sour candy and compress out of concern for salivary gland enlargement.   Aeron reports his pain is somewhat controlled. He describes his back pain as 'terrible' and has an MRI scheduled and mentions possible need for surgical intervention. His pain is being managed with use of oxycodone  as needed. UDS remains clear of significant illicit drugs outside of occasional marijuana use which he feels enhances his appetite. No changes to current regimen at this time.   We will continue to closely monitor and support.   Assessment & Plan Chronic arm pain  Chronic arm pain persists despite regimen, with pain radiating from the top of the arm to the elbow. The pain is exacerbated by movement and occasionally causes involuntary arm movements when lying down. He reports some improvement in movement but continues to experience discomfort. - Refer to surgeon for evaluation of chronic arm pain - Continue oxycodone  as needed.  - UDS every other visit. -Continue to monitor pain levels and adjust regimen as indicated  Back pain Pain management is ongoing with medication. - Refilled pain medication prescription - Await MRI results scheduled for Friday - Follow-up with doctor next week regarding potential surgery  Constipation and diarrhea Alternating constipation and diarrhea. Currently taking a stool softener every other day, resulting in bowel movements approximately daily. Goal is to prevent more than two days without a bowel movement. - Increase stool softener to one pill daily - If bowel movements become regular, adjust to one pill every other day - Monitor bowel movement frequency and adjust stool softener dosage as needed  Unintentional weight loss/Decreased appetite   Unintentional weight loss of approximately 10 pounds since the beginning of October. Appetite is variable. - Encourage consumption of Ensure or Boost nutritional supplements - Check for available samples of nutritional supplements  Neck pain with localized swelling (possible salivary gland or lymph node involvement) Acute neck pain with localized swelling, possibly involving a salivary gland or lymph  node. Swelling increased overnight, red, and tender to touch. Differential diagnosis includes salivary gland involvement or lymph node enlargement. -Recommended use of sour candy per patient   I will plan to see patient back in 2-3 weeks. Sooner if needed.    Patient expressed understanding and was in agreement with this plan. He also understands that He can call the clinic at any time with any questions, concerns, or complaints.   Any controlled substances utilized were prescribed in the context of palliative care. PDMP has been reviewed.   Visit consisted of counseling and education dealing with the complex and emotionally intense issues of symptom management and palliative care in the setting of serious and potentially life-threatening illness.  Levon Borer, AGPCNP-BC  Palliative Medicine Team/[facility]

## 2024-09-06 ENCOUNTER — Telehealth: Payer: Self-pay

## 2024-09-06 ENCOUNTER — Encounter: Payer: Self-pay | Admitting: Physician Assistant

## 2024-09-06 ENCOUNTER — Ambulatory Visit
Admission: RE | Admit: 2024-09-06 | Discharge: 2024-09-06 | Disposition: A | Discharge status: Home / Self Care | Source: Ambulatory Visit | Attending: Radiation Oncology | Admitting: Radiation Oncology

## 2024-09-06 ENCOUNTER — Other Ambulatory Visit: Payer: Self-pay

## 2024-09-06 ENCOUNTER — Encounter: Payer: Self-pay | Admitting: Internal Medicine

## 2024-09-06 ENCOUNTER — Ambulatory Visit: Admission: RE | Admit: 2024-09-06 | Discharge: 2024-09-06 | Attending: Radiation Oncology

## 2024-09-06 DIAGNOSIS — Z5112 Encounter for antineoplastic immunotherapy: Secondary | ICD-10-CM | POA: Diagnosis not present

## 2024-09-06 DIAGNOSIS — C3490 Malignant neoplasm of unspecified part of unspecified bronchus or lung: Secondary | ICD-10-CM | POA: Diagnosis not present

## 2024-09-06 DIAGNOSIS — N3941 Urge incontinence: Secondary | ICD-10-CM | POA: Diagnosis not present

## 2024-09-06 DIAGNOSIS — R6889 Other general symptoms and signs: Secondary | ICD-10-CM | POA: Diagnosis not present

## 2024-09-06 LAB — RAD ONC ARIA SESSION SUMMARY
Course Elapsed Days: 3
Plan Fractions Treated to Date: 4
Plan Prescribed Dose Per Fraction: 3 Gy
Plan Total Fractions Prescribed: 10
Plan Total Prescribed Dose: 30 Gy
Reference Point Dosage Given to Date: 12 Gy
Reference Point Session Dosage Given: 3 Gy
Session Number: 4

## 2024-09-06 MED ORDER — OXYCODONE-ACETAMINOPHEN 5-325 MG PO TABS: 1.0000 | ORAL_TABLET | Freq: Three times a day (TID) | ORAL | 0 refills | Status: DC | PRN

## 2024-09-06 NOTE — Telephone Encounter (Signed)
 LVM regarding pick up time for pain med refill.

## 2024-09-07 ENCOUNTER — Ambulatory Visit (HOSPITAL_COMMUNITY)
Admission: RE | Admit: 2024-09-07 | Discharge: 2024-09-07 | Disposition: A | Discharge status: Home / Self Care | Source: Ambulatory Visit | Attending: Radiation Oncology | Admitting: Radiation Oncology

## 2024-09-07 ENCOUNTER — Telehealth: Payer: Self-pay | Admitting: Radiation Oncology

## 2024-09-07 ENCOUNTER — Ambulatory Visit

## 2024-09-07 DIAGNOSIS — C3491 Malignant neoplasm of unspecified part of right bronchus or lung: Secondary | ICD-10-CM | POA: Insufficient documentation

## 2024-09-07 DIAGNOSIS — C801 Malignant (primary) neoplasm, unspecified: Secondary | ICD-10-CM | POA: Diagnosis not present

## 2024-09-07 DIAGNOSIS — C7951 Secondary malignant neoplasm of bone: Secondary | ICD-10-CM | POA: Diagnosis not present

## 2024-09-07 MED ORDER — GADOBUTROL 1 MMOL/ML IV SOLN
8.0000 mL | Freq: Once | INTRAVENOUS | Status: AC | PRN
  Administered 2024-09-07: 8 mL via INTRAVENOUS

## 2024-09-07 NOTE — Telephone Encounter (Signed)
 Pt called to advise he is not feeling well and will not be in for tx today. Pt advised he's been having to use the bathroom excessively and has increased back pain. He stated he will still try to make his MRI appt; I advised if he is still not feeling well closer to appt he should call that dept to cx appt. Pt verbalized understanding.

## 2024-09-10 ENCOUNTER — Encounter (INDEPENDENT_AMBULATORY_CARE_PROVIDER_SITE_OTHER): Payer: Self-pay | Admitting: Otolaryngology

## 2024-09-10 ENCOUNTER — Inpatient Hospital Stay

## 2024-09-10 ENCOUNTER — Other Ambulatory Visit: Payer: Self-pay

## 2024-09-10 ENCOUNTER — Ambulatory Visit
Admission: RE | Admit: 2024-09-10 | Discharge: 2024-09-10 | Disposition: A | Source: Ambulatory Visit | Attending: Radiation Oncology

## 2024-09-10 ENCOUNTER — Ambulatory Visit: Admitting: Radiation Oncology

## 2024-09-10 ENCOUNTER — Other Ambulatory Visit: Payer: Self-pay | Admitting: Radiation Oncology

## 2024-09-10 ENCOUNTER — Other Ambulatory Visit (HOSPITAL_COMMUNITY)
Admission: RE | Admit: 2024-09-10 | Discharge: 2024-09-10 | Disposition: A | Discharge status: Home / Self Care | Attending: Otolaryngology | Admitting: Otolaryngology

## 2024-09-10 ENCOUNTER — Ambulatory Visit (INDEPENDENT_AMBULATORY_CARE_PROVIDER_SITE_OTHER): Admitting: Otolaryngology

## 2024-09-10 VITALS — BP 108/68 | HR 115 | Ht 73.0 in | Wt 184.0 lb

## 2024-09-10 DIAGNOSIS — Z5112 Encounter for antineoplastic immunotherapy: Secondary | ICD-10-CM | POA: Diagnosis not present

## 2024-09-10 DIAGNOSIS — Z510 Encounter for antineoplastic radiation therapy: Secondary | ICD-10-CM | POA: Diagnosis not present

## 2024-09-10 DIAGNOSIS — R6889 Other general symptoms and signs: Secondary | ICD-10-CM | POA: Diagnosis not present

## 2024-09-10 DIAGNOSIS — L0211 Cutaneous abscess of neck: Secondary | ICD-10-CM | POA: Diagnosis not present

## 2024-09-10 DIAGNOSIS — K116 Mucocele of salivary gland: Secondary | ICD-10-CM | POA: Diagnosis not present

## 2024-09-10 DIAGNOSIS — C7951 Secondary malignant neoplasm of bone: Secondary | ICD-10-CM | POA: Diagnosis not present

## 2024-09-10 DIAGNOSIS — C3411 Malignant neoplasm of upper lobe, right bronchus or lung: Secondary | ICD-10-CM | POA: Diagnosis not present

## 2024-09-10 DIAGNOSIS — Z87891 Personal history of nicotine dependence: Secondary | ICD-10-CM | POA: Diagnosis not present

## 2024-09-10 LAB — RAD ONC ARIA SESSION SUMMARY
Course Elapsed Days: 7
Plan Fractions Treated to Date: 5
Plan Prescribed Dose Per Fraction: 3 Gy
Plan Total Fractions Prescribed: 10
Plan Total Prescribed Dose: 30 Gy
Reference Point Dosage Given to Date: 15 Gy
Reference Point Session Dosage Given: 3 Gy
Session Number: 5

## 2024-09-10 MED ORDER — CLINDAMYCIN HCL 300 MG PO CAPS: 300.0000 mg | ORAL_CAPSULE | Freq: Three times a day (TID) | ORAL | 0 refills | Status: AC

## 2024-09-10 MED ORDER — LORAZEPAM 0.5 MG PO TABS: ORAL_TABLET | ORAL | 0 refills | Status: AC

## 2024-09-10 NOTE — Progress Notes (Unsigned)
 Dear Dr. Dewey, Here is my assessment for our mutual patient, Benjamin Herrera. Thank you for allowing me the opportunity to care for your patient. Please do not hesitate to contact me should you have any other questions. Sincerely, Dr. Eldora Blanch  Otolaryngology Clinic Note Referring provider: Dr. Dewey HPI:  Initial visit (08/2024): Discussed the use of AI scribe software for clinical note transcription with the patient, who gave verbal consent to proceed.  History of Present Illness Benjamin Herrera is a 58 year old male with lung cancer and HIV who presents with an enlarging parotid cyst.  He has noted a little knot in his parotid gland on right that has been present for a while, but now enlarging over the past week, with reports of purulent drainage. The area is painful. He has not attempted any treatment. There is no fever, facial weakness, numbness, or odynophagia. No skin cancers over his H&N. No recent trauma to the area. He has had a biopsy of the parotid lesion, but this he reports seems different  He is undergoing treatment for lung cancer and has HIV. He reports weight loss but no dyspnea.  He is a former smoker. He is not on anticoagulants and has no history of ENT surgeries.  Patient otherwise denies: - dysphagia, odynophagia,  - changes in voice - ear pain  H&N Surgery: denies Personal or FHx of bleeding dz or anesthesia difficulty: no  GLP-1: no AP/AC: no  Tobacco: former, quit  PMHx: CAD, HTN, OSA, Asthma, Lung Cancer, GERD, h/o substance use, HIV Independent Review of Additional Tests or Records:  Benjamin Herrera (09/03/2024): stage 4 adenocarcinoma right lung, underwent palliative course course of therapy; noted b/l Parotid lesions; Dx: Right superficial neck pain and noted parotid lesions; Rx: ref to ENT Dr. Sherrod (09/05/2024): given carboplatin , alimta , libtayo . Noted neutropenia and HIV Labs CBC and CMP (09/05/2024): BUN/Cr 17/1.19; WBC 5.0, Hgb 8.4, Plt  147; ANC 2.8 HIV quant and CD 4 (05/09/2024): Detectable viral load, CD4 <35 05/03/2024 Parotid FNA: reactive lymphoid infiltrate;reactive lymphoid hyperplasia; no malignant cells noted PET/CT 04/09/2024 interpreted with respect to H&N: noted b/l symmetry BOT uptake; right parotid lesion with SUV max 4.9, appears to be more cystic/hypodense in nature MRI Brain 09/03/2024 interpreted with respect to parotid: left and right cystic appearing parotid mass, R>L; 2.7 cm right, subcentimeter left appears stable; nasal septal perforation, b/l max MPT PMH/Meds/All/SocHx/FamHx/ROS:   Past Medical History:  Diagnosis Date   Achalasia and cardiospasm    Allergy    Anxiety    Arthritis    back ,shoulders    Asthma    Boils    under arms hx of   CAD (coronary artery disease)    a. 09/2016: cath showing 100% RCA occlusion with collaterals and 90% OM1 (treated with DES)   Candidiasis of mouth    Candidiasis of unspecified site    Cholelithiasis    Cocaine abuse (HCC)    Quit 09/2016   Condyloma acuminatum in male of scrotum & anal canal s/p laser ablation 07/03/2012   Coronary artery disease    Dysphagia    Fracture, humerus    Frozen shoulder    left   GERD (gastroesophageal reflux disease)    Heart attack (HCC)    Hemorrhoids    Hepatitis 1993   A   Herpes labialis    HIV (human immunodeficiency virus infection) (HCC) DX 1993   Hypertension Dx 2014   off all bp meds for last 9 or 10  months   Pilonidal disease 01/10/2012   Polysubstance abuse (HCC)    Psoriasis    hx of   Sleep apnea    does  have CPAP   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/03/2012   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 08/03/2012     Past Surgical History:  Procedure Laterality Date   BALLOON DILATION N/A 11/11/2015   Procedure: BALLOON DILATION;  Surgeon: Gustav Shila GAILS, MD;  Location: [facility];  Service: Endoscopy;  Laterality: N/A;   BOTOX   INJECTION  10/11/2012   Procedure: BOTOX  INJECTION;  Surgeon: Lamar JONETTA Aho, MD;  Location: [facility];  Service: Endoscopy;  Laterality: N/A;   BOTOX  INJECTION N/A 01/25/2013   Procedure: BOTOX  INJECTION;  Surgeon: Lamar JONETTA Aho, MD;  Location: [facility];  Service: Endoscopy;  Laterality: N/A;   BOTOX  INJECTION N/A 06/21/2013   Procedure: BOTOX  INJECTION;  Surgeon: Lamar JONETTA Aho, MD;  Location: [facility];  Service: Endoscopy;  Laterality: N/A;   BOTOX  INJECTION N/A 10/08/2013   Procedure: BOTOX  INJECTION;  Surgeon: Lamar JONETTA Aho, MD;  Location: [facility];  Service: Endoscopy;  Laterality: N/A;   BOTOX  INJECTION N/A 06/16/2015   Procedure: BOTOX  INJECTION;  Surgeon: Lamar JONETTA Aho, MD;  Location: [facility];  Service: Endoscopy;  Laterality: N/A;   BRONCHIAL BIOPSY  04/03/2024   Procedure: BRONCHOSCOPY, WITH BIOPSY;  Surgeon: Shelah Lamar RAMAN, MD;  Location[facility];  Service: Pulmonary;;   BRONCHIAL BRUSHINGS  04/03/2024   Procedure: BRONCHOSCOPY, WITH BRUSH BIOPSY;  Surgeon: Shelah Lamar RAMAN, MD;  Location: [facility];  Service: Pulmonary;;   BRONCHIAL NEEDLE ASPIRATION BIOPSY  04/03/2024   Procedure: BRONCHOSCOPY, WITH NEEDLE ASPIRATION BIOPSY;  Surgeon: Shelah Lamar RAMAN, MD;  Location: [facility];  Service: Pulmonary;;   BRONCHOSCOPY, WITH BIOPSY USING ELECTROMAGNETIC NAVIGATION Right 04/03/2024   Procedure: BRONCHOSCOPY, WITH BIOPSY USING ELECTROMAGNETIC NAVIGATION;  Surgeon: Shelah Lamar RAMAN, MD;  Location: [facility];  Service: Pulmonary;  Laterality: Right;   CARDIAC CATHETERIZATION N/A 10/11/2016   Procedure: Left Heart Cath and Coronary Angiography;  Surgeon: Lonni JONETTA Cash, MD;  mRCA 100% w/ L>R collaterals, OM1 90%   CARDIAC CATHETERIZATION N/A 10/11/2016   Procedure: Coronary Stent Intervention;  Surgeon: Lonni JONETTA Cash, MD;  RESOLUTE ONYX 3.5X15 DES OM1   COLONOSCOPY  05/19/2012   Procedure: COLONOSCOPY;  Surgeon: Lamar JONETTA Aho, MD;   Location: [facility];  Service: Endoscopy;  Laterality: N/A;  jill trying to contact pt to come in 0830 for 930 case, phone not accepting messages   COLONOSCOPY     ESOPHAGEAL MANOMETRY  05/29/2012   Procedure: ESOPHAGEAL MANOMETRY (EM);  Surgeon: Lamar JONETTA Aho, MD;  Location: [facility];  Service: Endoscopy;  Laterality: N/A;   ESOPHAGEAL MANOMETRY N/A 10/06/2015   Procedure: ESOPHAGEAL MANOMETRY (EM);  Surgeon: Gustav Shila GAILS, MD;  Location: [facility];  Service: Endoscopy;  Laterality: N/A;   ESOPHAGOGASTRODUODENOSCOPY  11/11/2011   Procedure: ESOPHAGOGASTRODUODENOSCOPY (EGD);  Surgeon: Lamar JONETTA Aho, MD;  Location: [facility];  Service: Endoscopy;  Laterality: N/A;  botox  injection  called Pt to change time of procedure per Dr Aho   ESOPHAGOGASTRODUODENOSCOPY  03/10/2012   Procedure: ESOPHAGOGASTRODUODENOSCOPY (EGD);  Surgeon: Lamar JONETTA Aho, MD;  Location: [facility];  Service: Endoscopy;  Laterality: N/A;   ESOPHAGOGASTRODUODENOSCOPY  10/11/2012   Procedure: ESOPHAGOGASTRODUODENOSCOPY (EGD);  Surgeon: Lamar JONETTA Aho, MD;  Location: [facility];  Service: Endoscopy;  Laterality: N/A;   ESOPHAGOGASTRODUODENOSCOPY  N/A 01/25/2013   Procedure: ESOPHAGOGASTRODUODENOSCOPY (EGD);  Surgeon: Lamar JONETTA Aho, MD;  Location: [facility];  Service: Endoscopy;  Laterality: N/A;   ESOPHAGOGASTRODUODENOSCOPY N/A 06/21/2013   Procedure: ESOPHAGOGASTRODUODENOSCOPY (EGD);  Surgeon: Lamar JONETTA Aho, MD;  Location: [facility];  Service: Endoscopy;  Laterality: N/A;   ESOPHAGOGASTRODUODENOSCOPY (EGD) WITH PROPOFOL  N/A 10/08/2013   Procedure: ESOPHAGOGASTRODUODENOSCOPY (EGD) WITH PROPOFOL ;  Surgeon: Lamar JONETTA Aho, MD;  Location: [facility];  Service: Endoscopy;  Laterality: N/A;   ESOPHAGOGASTRODUODENOSCOPY (EGD) WITH PROPOFOL  N/A 06/16/2015   Procedure: ESOPHAGOGASTRODUODENOSCOPY (EGD) WITH PROPOFOL ;  Surgeon: Lamar JONETTA Aho, MD;  Location: [facility];  Service: Endoscopy;  Laterality:  N/A;   ESOPHAGOGASTRODUODENOSCOPY (EGD) WITH PROPOFOL  N/A 11/11/2015   Procedure: ESOPHAGOGASTRODUODENOSCOPY (EGD) WITH PROPOFOL ;  Surgeon: Gustav Shila GAILS, MD;  Location: [facility];  Service: Endoscopy;  Laterality: N/A;   EXAMINATION UNDER ANESTHESIA  07/24/2012   Procedure: EXAM UNDER ANESTHESIA;  Surgeon: Elspeth KYM Schultze, MD;  Location: [facility];  Service: General;  Laterality: N/A;   humeral fracture surgery Right yrs ago   PILONIDAL CYST EXCISION  07/24/2012   Procedure: CYST EXCISION PILONIDAL SIMPLE;  Surgeon: Elspeth KYM Schultze, MD;  Location: [facility];  Service: General;  Laterality: N/A;  Exam Under Anesthesia,, Excision Pilonidal Disease,    right shoulder replacement  2008   x 2   UPPER GASTROINTESTINAL ENDOSCOPY     WART FULGURATION  07/24/2012   Procedure: FULGURATION ANAL WART;  Surgeon: Elspeth KYM Schultze, MD;  Location: [facility];  Service: General;  Laterality: N/A;  excision of raphe mass    Family History  Problem Relation Age of Onset   Arthritis Mother    Hypertension Mother    Heart disease Mother        MI age 45   Heart attack Sister    Diabetes Maternal Grandmother    Stomach cancer Paternal Grandmother    Colon cancer Neg Hx    Colon polyps Neg Hx    Esophageal cancer Neg Hx    Rectal cancer Neg Hx      Social Connections: Socially Isolated (11/08/2023)   Social Connection and Isolation Panel    Frequency of Communication with Friends and Family: Three times a week    Frequency of Social Gatherings with Friends and Family: Three times a week    Attends Religious Services: Never    Active Member of Clubs or Organizations: No    Attends Banker Meetings: Never    Marital Status: Never married      Current Outpatient Medications:    acetaminophen  (TYLENOL ) 325 MG tablet, Take 650 mg by mouth every 6 (six) hours as needed for mild pain (pain score 1-3), moderate pain (pain score 4-6) or headache., Disp: , Rfl:    albuterol  (VENTOLIN  HFA) 108 (90  Base) MCG/ACT inhaler, Inhale 2 puffs into the lungs every 6 (six) hours as needed for wheezing or shortness of breath., Disp: 1 each, Rfl: 2   amoxicillin -clavulanate (AUGMENTIN ) 875-125 MG tablet, Take 1 tablet by mouth 2 (two) times daily., Disp: 20 tablet, Rfl: 0   aspirin  81 MG chewable tablet, Chew 1 tablet (81 mg total) by mouth daily., Disp: 90 tablet, Rfl: 3   Bictegravir-Emtricitab-Tenofov (BIKTARVY ) 30-120-15 MG TABS, Take 1 tablet by mouth daily., Disp: 90 tablet, Rfl: 3   Blood Pressure KIT, 1 kit by Does not apply route daily., Disp: 1 kit, Rfl: 0   clindamycin (CLEOCIN) 300 MG capsule, Take 1 capsule (300 mg total) by  mouth 3 (three) times daily for 10 days., Disp: 30 capsule, Rfl: 0   diclofenac  Sodium (VOLTAREN  ARTHRITIS PAIN) 1 % GEL, Apply 2 g topically 4 (four) times daily., Disp: 100 g, Rfl: 3   folic acid  (FOLVITE ) 1 MG tablet, Take 1 tablet (1 mg total) by mouth daily. Start 7 days before pemetrexed  chemotherapy. Continue until 21 days after pemetrexed  completed., Disp: 30 tablet, Rfl: 3   hydrOXYzine  (ATARAX ) 25 MG tablet, Take 1 tablet (25 mg total) by mouth 3 (three) times daily as needed., Disp: 30 tablet, Rfl: 0   lidocaine  (XYLOCAINE ) 2 % solution, SWISH AND SWALLOW 5 MLS 3 (THREE) TIMES DAILY AS NEEDED FOR MOUTH PAIN., Disp: 360 mL, Rfl: 1   lidocaine -prilocaine  (EMLA ) cream, Apply to affected area once, Disp: 30 g, Rfl: 3   LORazepam  (ATIVAN ) 0.5 MG tablet, TAKE 1 TAB BY MOUTH 30 MINUTES PRIOR TO RADIATION OR MRI SCANS, Disp: 4 tablet, Rfl: 0   magic mouthwash (nystatin , diphenhydrAMINE , alum & mag hydroxide) suspension mixture, Take 5 mLs by mouth 3 (three) times daily as needed for mouth pain., Disp: 240 mL, Rfl: 1   methocarbamol  (ROBAXIN ) 500 MG tablet, Take 2 tablets (1,000 mg total) by mouth every 8 (eight) hours as needed., Disp: 90 tablet, Rfl: 1   metoprolol  succinate (TOPROL -XL) 25 MG 24 hr tablet, TAKE 1/2 TABLET BY MOUTH DAILY, Disp: 45 tablet, Rfl: 0    nitroGLYCERIN  (NITROSTAT ) 0.4 MG SL tablet, Place 1 tablet (0.4 mg total) under the tongue every 5 (five) minutes as needed for chest pain (trouble swallowing)., Disp: 25 tablet, Rfl: 1   nystatin  (MYCOSTATIN ) 100000 UNIT/ML suspension, Take by mouth., Disp: , Rfl:    ondansetron  (ZOFRAN ) 8 MG tablet, Take 1 tablet (8 mg total) by mouth every 8 (eight) hours as needed for nausea or vomiting. Start on the third day after carboplatin ., Disp: 30 tablet, Rfl: 1   oxyCODONE -acetaminophen  (PERCOCET/ROXICET) 5-325 MG tablet, Take 1 tablet by mouth every 8 (eight) hours as needed for severe pain (pain score 7-10)., Disp: 45 tablet, Rfl: 0   potassium chloride  SA (KLOR-CON  M) 20 MEQ tablet, Take 1 tablet (20 mEq total) by mouth daily., Disp: 6 tablet, Rfl: 0   predniSONE (DELTASONE) 50 MG tablet, Take one tablet 13 hours before , one tablet 7 hours before and one tablet one hour prior to CT scan. Take benadryl  50 mg one hour prior to scan, Disp: 3 tablet, Rfl: 0   prochlorperazine  (COMPAZINE ) 10 MG tablet, Take 1 tablet (10 mg total) by mouth every 6 (six) hours as needed for nausea or vomiting., Disp: 30 tablet, Rfl: 1   sulfamethoxazole -trimethoprim  (BACTRIM  DS) 800-160 MG tablet, Take 1 tablet by mouth 3 (three) times a week., Disp: 36 tablet, Rfl: 3   amLODipine  (NORVASC ) 5 MG tablet, Take 1 tablet (5 mg total) by mouth daily. (Patient not taking: Reported on 09/10/2024), Disp: 90 tablet, Rfl: 1   Physical Exam:   BP 108/68 (BP Location: Right Arm, Patient Position: Sitting, Cuff Size: Large)   Pulse (!) 115   Ht 6' 1 (1.854 m)   Wt 184 lb (83.5 kg)   SpO2 95%   BMI 24.28 kg/m   Salient findings:  CN II-XII intact Bilateral EAC clear and TM intact with well pneumatized middle ear spaces Anterior rhinoscopy: Septum with fairly large perforation; bilateral inferior turbinates with modest hypertrophy No lesions of oral cavity/oropharynx; easily expressible saliva b/l parotid ducts No obviously  palpable neck masses/lymphadenopathy/thyromegaly EXCEPT right tail  of parotid region with soft mass with slight overlying erythema, tender; approx 2x1 cm; no surrounding skin lesions; no other masses appreciated. Given concern for infection, we discussed R/B/A and needle aspiration of lesion to rule out infection was done.  No respiratory distress or stridor  Seprately Identifiable Procedures:  Prior to initiating any procedures, risks/benefits/alternatives were explained to the patient and verbal consent obtained. Procedure Note Pre-procedure diagnosis: Right parotid lesion, concern for Abscess Post-procedure diagnosis: Same Procedure: Needle aspiration of Right Neck Abscess, CPT 10160 Complications: None apparent EBL: <5 mL  Indication: Benjamin Herrera is a 58 y.o.  male with concern for right parotid lesion and abscess. Decision was made for needle drainage of abscess following discussion of risks/benefits of procedure.  The patient was identified as the correct patient.  The patient is able to give consent, therefore written consent was obtained. Surrounding skin was cleansed, draped and prepped with betadine in usual fashion for procedure. 1% Lidocaine  with 1:100,000 epinephrine  surrounding the area of fluctuance. An 78 G needle was used to aspirate the pocket of abscess - there was no frank purulence, but some thick appearing drainage (almost character of epidermoid cyst) was aspirated. The area was decompressed. The skin was cleansed. Culture was sent.  Patient tolerated this procedure well and there were no immediately apparent complications.  Electronically signed by: Eldora KATHEE Blanch, MD 09/11/2024 6:27 PM   Impression & Plans:  Benjamin Herrera is a 58 y.o. male with:  1. Parotid cyst   2. Neck abscess    Perhaps he has two processes going on -- parotid cysts/lesions have been biopsied with lymphoid infiltrate, likely benign lymphoepithelial cyst. The area over the neck/parotid tail,  now, however, seems more acute and different. Typically parotid cysts are non tender and so I wonder if this is a separate more skin related lesion v/s infected lymphoepithelial cyst (but this seems quite superficial). As such, needle aspiration was done and will send cultures Given immunocompromised state, will also Rx with abx - f/u in 7-10 days; f/u cultures - He will call if worsens in interim;  - Clindamycin TID x10d  See below regarding exact medications prescribed this encounter including dosages and route: Meds ordered this encounter  Medications   clindamycin (CLEOCIN) 300 MG capsule    Sig: Take 1 capsule (300 mg total) by mouth 3 (three) times daily for 10 days.    Dispense:  30 capsule    Refill:  0      Thank you for allowing me the opportunity to care for your patient. Please do not hesitate to contact me should you have any other questions.  Sincerely, Eldora Blanch, MD Otolaryngologist (ENT), [facility] Phone: [phone] Fax: [phone]  09/11/2024, 6:27 PM   MDM:  I have personally spent 60 minutes involved in face-to-face and non-face-to-face activities for this patient on the day of the visit.  Professional time spent excludes any procedures performed but includes the following activities, in addition to those noted in the documentation: preparing to see the patient (review of outside documentation and results), performing a medically appropriate examination, counseling, ordering medications (clindamycin) documenting in the electronic health record, independently interpreting results (MRI, PET), extensive chart review given complicated medical history and multiple visits

## 2024-09-11 ENCOUNTER — Ambulatory Visit
Admission: RE | Admit: 2024-09-11 | Discharge: 2024-09-11 | Disposition: A | Source: Ambulatory Visit | Attending: Radiation Oncology

## 2024-09-11 ENCOUNTER — Other Ambulatory Visit: Payer: Self-pay

## 2024-09-11 DIAGNOSIS — R6889 Other general symptoms and signs: Secondary | ICD-10-CM | POA: Diagnosis not present

## 2024-09-11 DIAGNOSIS — Z510 Encounter for antineoplastic radiation therapy: Secondary | ICD-10-CM | POA: Diagnosis not present

## 2024-09-11 DIAGNOSIS — C7931 Secondary malignant neoplasm of brain: Secondary | ICD-10-CM | POA: Diagnosis not present

## 2024-09-11 DIAGNOSIS — C3411 Malignant neoplasm of upper lobe, right bronchus or lung: Secondary | ICD-10-CM | POA: Diagnosis not present

## 2024-09-11 DIAGNOSIS — Z87891 Personal history of nicotine dependence: Secondary | ICD-10-CM | POA: Diagnosis not present

## 2024-09-11 DIAGNOSIS — Z5112 Encounter for antineoplastic immunotherapy: Secondary | ICD-10-CM | POA: Diagnosis not present

## 2024-09-11 LAB — RAD ONC ARIA SESSION SUMMARY
Course Elapsed Days: 8
Plan Fractions Treated to Date: 6
Plan Prescribed Dose Per Fraction: 3 Gy
Plan Total Fractions Prescribed: 10
Plan Total Prescribed Dose: 30 Gy
Reference Point Dosage Given to Date: 18 Gy
Reference Point Session Dosage Given: 3 Gy
Session Number: 6

## 2024-09-11 NOTE — Progress Notes (Incomplete)
 Chief Complaint: Patient was seen in consultation today for No chief complaint on file.  at the request of Groce,Sarah F  Referring Physician(s): Groce,Sarah F  History of Present Illness: Benjamin Herrera is a 58 y.o. male ***  Past Medical History:  Diagnosis Date   Achalasia and cardiospasm    Allergy    Anxiety    Arthritis    back ,shoulders    Asthma    Boils    under arms hx of   CAD (coronary artery disease)    a. 09/2016: cath showing 100% RCA occlusion with collaterals and 90% OM1 (treated with DES)   Candidiasis of mouth    Candidiasis of unspecified site    Cholelithiasis    Cocaine abuse (HCC)    Quit 09/2016   Condyloma acuminatum in male of scrotum & anal canal s/p laser ablation 07/03/2012   Coronary artery disease    Dysphagia    Fracture, humerus    Frozen shoulder    left   GERD (gastroesophageal reflux disease)    Heart attack (HCC)    Hemorrhoids    Hepatitis 1993   A   Herpes labialis    HIV (human immunodeficiency virus infection) (HCC) DX 1993   Hypertension Dx 2014   off all bp meds for last 9 or 10 months   Pilonidal disease 01/10/2012   Polysubstance abuse (HCC)    Psoriasis    hx of   Sleep apnea    does  have CPAP   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/03/2012   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 08/03/2012    Past Surgical History:  Procedure Laterality Date   BALLOON DILATION N/A 11/11/2015   Procedure: BALLOON DILATION;  Surgeon: Gustav Shila GAILS, MD;  Location: [facility];  Service: Endoscopy;  Laterality: N/A;   BOTOX  INJECTION  10/11/2012   Procedure: BOTOX  INJECTION;  Surgeon: Lamar JONETTA Aho, MD;  Location: [facility];  Service: Endoscopy;  Laterality: N/A;   BOTOX  INJECTION N/A 01/25/2013   Procedure: BOTOX  INJECTION;  Surgeon: Lamar JONETTA Aho, MD;  Location: [facility];  Service: Endoscopy;  Laterality: N/A;   BOTOX  INJECTION N/A 06/21/2013    Procedure: BOTOX  INJECTION;  Surgeon: Lamar JONETTA Aho, MD;  Location: [facility];  Service: Endoscopy;  Laterality: N/A;   BOTOX  INJECTION N/A 10/08/2013   Procedure: BOTOX  INJECTION;  Surgeon: Lamar JONETTA Aho, MD;  Location: [facility];  Service: Endoscopy;  Laterality: N/A;   BOTOX  INJECTION N/A 06/16/2015   Procedure: BOTOX  INJECTION;  Surgeon: Lamar JONETTA Aho, MD;  Location: [facility];  Service: Endoscopy;  Laterality: N/A;   BRONCHIAL BIOPSY  04/03/2024   Procedure: BRONCHOSCOPY, WITH BIOPSY;  Surgeon: Shelah Lamar RAMAN, MD;  Location: [facility];  Service: Pulmonary;;   BRONCHIAL BRUSHINGS  04/03/2024   Procedure: BRONCHOSCOPY, WITH BRUSH BIOPSY;  Surgeon: Shelah Lamar RAMAN, MD;  Location: [facility];  Service: Pulmonary;;   BRONCHIAL NEEDLE ASPIRATION BIOPSY  04/03/2024   Procedure: BRONCHOSCOPY, WITH NEEDLE ASPIRATION BIOPSY;  Surgeon: Shelah Lamar RAMAN, MD;  Location: [facility];  Service: Pulmonary;;   BRONCHOSCOPY, WITH BIOPSY USING ELECTROMAGNETIC NAVIGATION Right 04/03/2024   Procedure: BRONCHOSCOPY, WITH BIOPSY USING ELECTROMAGNETIC NAVIGATION;  Surgeon: Shelah Lamar RAMAN, MD;  Location: [facility];  Service: Pulmonary;  Laterality: Right;   CARDIAC CATHETERIZATION N/A 10/11/2016   Procedure: Left Heart Cath and Coronary Angiography;  Surgeon: Lonni JONETTA Cash, MD;  mRCA  100% w/ L>R collaterals, OM1 90%   CARDIAC CATHETERIZATION N/A 10/11/2016   Procedure: Coronary Stent Intervention;  Surgeon: Lonni JONETTA Cash, MD;  RESOLUTE ONYX 3.5X15 DES OM1   COLONOSCOPY  05/19/2012   Procedure: COLONOSCOPY;  Surgeon: Lamar JONETTA Aho, MD;  Location: [facility];  Service: Endoscopy;  Laterality: N/A;  jill trying to contact pt to come in 0830 for 930 case, phone not accepting messages   COLONOSCOPY     ESOPHAGEAL MANOMETRY  05/29/2012   Procedure: ESOPHAGEAL MANOMETRY (EM);  Surgeon: Lamar JONETTA Aho, MD;  Location: [facility];  Service: Endoscopy;  Laterality: N/A;    ESOPHAGEAL MANOMETRY N/A 10/06/2015   Procedure: ESOPHAGEAL MANOMETRY (EM);  Surgeon: Gustav Shila GAILS, MD;  Location: [facility];  Service: Endoscopy;  Laterality: N/A;   ESOPHAGOGASTRODUODENOSCOPY  11/11/2011   Procedure: ESOPHAGOGASTRODUODENOSCOPY (EGD);  Surgeon: Lamar JONETTA Aho, MD;  Location: [facility];  Service: Endoscopy;  Laterality: N/A;  botox  injection  called Pt to change time of procedure per Dr Aho   ESOPHAGOGASTRODUODENOSCOPY  03/10/2012   Procedure: ESOPHAGOGASTRODUODENOSCOPY (EGD);  Surgeon: Lamar JONETTA Aho, MD;  Location: [facility];  Service: Endoscopy;  Laterality: N/A;   ESOPHAGOGASTRODUODENOSCOPY  10/11/2012   Procedure: ESOPHAGOGASTRODUODENOSCOPY (EGD);  Surgeon: Lamar JONETTA Aho, MD;  Location: [facility];  Service: Endoscopy;  Laterality: N/A;   ESOPHAGOGASTRODUODENOSCOPY N/A 01/25/2013   Procedure: ESOPHAGOGASTRODUODENOSCOPY (EGD);  Surgeon: Lamar JONETTA Aho, MD;  Location: [facility];  Service: Endoscopy;  Laterality: N/A;   ESOPHAGOGASTRODUODENOSCOPY N/A 06/21/2013   Procedure: ESOPHAGOGASTRODUODENOSCOPY (EGD);  Surgeon: Lamar JONETTA Aho, MD;  Location: [facility];  Service: Endoscopy;  Laterality: N/A;   ESOPHAGOGASTRODUODENOSCOPY (EGD) WITH PROPOFOL  N/A 10/08/2013   Procedure: ESOPHAGOGASTRODUODENOSCOPY (EGD) WITH PROPOFOL ;  Surgeon: Lamar JONETTA Aho, MD;  Location: [facility];  Service: Endoscopy;  Laterality: N/A;   ESOPHAGOGASTRODUODENOSCOPY (EGD) WITH PROPOFOL  N/A 06/16/2015   Procedure: ESOPHAGOGASTRODUODENOSCOPY (EGD) WITH PROPOFOL ;  Surgeon: Lamar JONETTA Aho, MD;  Location: [facility];  Service: Endoscopy;  Laterality: N/A;   ESOPHAGOGASTRODUODENOSCOPY (EGD) WITH PROPOFOL  N/A 11/11/2015   Procedure: ESOPHAGOGASTRODUODENOSCOPY (EGD) WITH PROPOFOL ;  Surgeon: Gustav Shila GAILS, MD;  Location: [facility];  Service: Endoscopy;  Laterality: N/A;   EXAMINATION UNDER ANESTHESIA  07/24/2012   Procedure: EXAM UNDER ANESTHESIA;  Surgeon: Elspeth KYM Schultze, MD;   Location: [facility];  Service: General;  Laterality: N/A;   humeral fracture surgery Right yrs ago   PILONIDAL CYST EXCISION  07/24/2012   Procedure: CYST EXCISION PILONIDAL SIMPLE;  Surgeon: Elspeth KYM Schultze, MD;  Location: [facility];  Service: General;  Laterality: N/A;  Exam Under Anesthesia,, Excision Pilonidal Disease,    right shoulder replacement  2008   x 2   UPPER GASTROINTESTINAL ENDOSCOPY     WART FULGURATION  07/24/2012   Procedure: FULGURATION ANAL WART;  Surgeon: Elspeth KYM Schultze, MD;  Location: [facility];  Service: General;  Laterality: N/A;  excision of raphe mass    Allergies: Citrus  Medications: Prior to Admission medications   Medication Sig Start Date End Date Taking? Authorizing Provider  acetaminophen  (TYLENOL ) 325 MG tablet Take 650 mg by mouth every 6 (six) hours as needed for mild pain (pain score 1-3), moderate pain (pain score 4-6) or headache.    [provider]  albuterol  (VENTOLIN  HFA) 108 (90 Base) MCG/ACT inhaler Inhale 2 puffs into the lungs every 6 (six) hours as needed for wheezing or shortness of breath. 04/07/21   Eben Reyes BROCKS, MD  amLODipine  (NORVASC ) 5 MG tablet Take 1 tablet (5  mg total) by mouth daily. Patient not taking: Reported on 09/10/2024 05/31/24 08/29/24  Mona Vinie BROCKS, MD  amoxicillin -clavulanate (AUGMENTIN ) 875-125 MG tablet Take 1 tablet by mouth 2 (two) times daily. 05/10/24   Boscia, Heather E, NP  aspirin  81 MG chewable tablet Chew 1 tablet (81 mg total) by mouth daily. 10/28/16   Funches, Josalyn, MD  Bictegravir-Emtricitab-Tenofov (BIKTARVY ) 30-120-15 MG TABS Take 1 tablet by mouth daily. 05/09/24   Eben Reyes BROCKS, MD  Blood Pressure KIT 1 kit by Does not apply route daily. 03/23/23   Walker, Caitlin S, NP  clindamycin (CLEOCIN) 300 MG capsule Take 1 capsule (300 mg total) by mouth 3 (three) times daily for 10 days. 09/10/24 09/20/24  Tobie Eldora NOVAK, MD  diclofenac  Sodium (VOLTAREN  ARTHRITIS PAIN) 1 % GEL Apply 2 g topically 4  (four) times daily. 01/05/24   Danton Bevan Disney HERO, PA-C  folic acid  (FOLVITE ) 1 MG tablet Take 1 tablet (1 mg total) by mouth daily. Start 7 days before pemetrexed  chemotherapy. Continue until 21 days after pemetrexed  completed. 08/15/24   Heilingoetter, Cassandra L, PA-C  hydrOXYzine  (ATARAX ) 25 MG tablet Take 1 tablet (25 mg total) by mouth 3 (three) times daily as needed. 05/09/24   Eben Reyes BROCKS, MD  lidocaine  (XYLOCAINE ) 2 % solution SWISH AND SWALLOW 5 MLS 3 (THREE) TIMES DAILY AS NEEDED FOR MOUTH PAIN. 05/09/24   Eben Reyes BROCKS, MD  lidocaine -prilocaine  (EMLA ) cream Apply to affected area once 04/16/24   Sherrod Sherrod, MD  LORazepam  (ATIVAN ) 0.5 MG tablet TAKE 1 TAB BY MOUTH 30 MINUTES PRIOR TO RADIATION OR MRI SCANS 09/10/24   [name], PA-C  magic mouthwash (nystatin , diphenhydrAMINE , alum & mag hydroxide) suspension mixture Take 5 mLs by mouth 3 (three) times daily as needed for mouth pain. 08/29/24   Eben Reyes BROCKS, MD  methocarbamol  (ROBAXIN ) 500 MG tablet Take 2 tablets (1,000 mg total) by mouth every 8 (eight) hours as needed. 01/05/24   McClung, Angela M, PA-C  metoprolol  succinate (TOPROL -XL) 25 MG 24 hr tablet TAKE 1/2 TABLET BY MOUTH DAILY 04/03/24   Hilty, Vinie BROCKS, MD  nitroGLYCERIN  (NITROSTAT ) 0.4 MG SL tablet Place 1 tablet (0.4 mg total) under the tongue every 5 (five) minutes as needed for chest pain (trouble swallowing). 01/02/24   Craig Alan SAUNDERS, PA-C  nystatin  (MYCOSTATIN ) 100000 UNIT/ML suspension Take by mouth. 06/01/24   [provider]  ondansetron  (ZOFRAN ) 8 MG tablet Take 1 tablet (8 mg total) by mouth every 8 (eight) hours as needed for nausea or vomiting. Start on the third day after carboplatin . 04/16/24   Sherrod Sherrod, MD  oxyCODONE -acetaminophen  (PERCOCET/ROXICET) 5-325 MG tablet Take 1 tablet by mouth every 8 (eight) hours as needed for severe pain (pain score 7-10). 09/06/24   Pickenpack-Cousar, Athena N, NP  potassium chloride   SA (KLOR-CON  M) 20 MEQ tablet Take 1 tablet (20 mEq total) by mouth daily. 08/29/24   Heilingoetter, Cassandra L, PA-C  predniSONE (DELTASONE) 50 MG tablet Take one tablet 13 hours before , one tablet 7 hours before and one tablet one hour prior to CT scan. Take benadryl  50 mg one hour prior to scan 08/22/24   Sherrod Sherrod, MD  prochlorperazine  (COMPAZINE ) 10 MG tablet Take 1 tablet (10 mg total) by mouth every 6 (six) hours as needed for nausea or vomiting. 04/16/24   Sherrod Sherrod, MD  sulfamethoxazole -trimethoprim  (BACTRIM  DS) 800-160 MG tablet Take 1 tablet by mouth 3 (three) times a week. 06/20/24   Eben Reyes  C, MD     Family History  Problem Relation Age of Onset   Arthritis Mother    Hypertension Mother    Heart disease Mother        MI age 68   Heart attack Sister    Diabetes Maternal Grandmother    Stomach cancer Paternal Grandmother    Colon cancer Neg Hx    Colon polyps Neg Hx    Esophageal cancer Neg Hx    Rectal cancer Neg Hx     Social History   Socioeconomic History   Marital status: Single    Spouse name: Not on file   Number of children: 0   Years of education: Not on file   Highest education level: Not on file  Occupational History   Occupation: Disabled  Tobacco Use   Smoking status: Former    Current packs/day: 1.00    Average packs/day: 1 pack/day for 42.9 years (42.9 ttl pk-yrs)    Types: Cigarettes    Start date: 1983   Smokeless tobacco: Never   Tobacco comments:    4 or 5 cigarettes a day     States quit one week ago 04/02/2024  Vaping Use   Vaping status: Never Used  Substance and Sexual Activity   Alcohol use: No    Alcohol/week: 0.0 standard drinks of alcohol   Drug use: Not Currently    Frequency: 3.0 times per week    Types: Marijuana   Sexual activity: Not Currently    Partners: Male    Comment: pt. given condoms, used marijuana -3 days ago 05/14/16  Other Topics Concern   Not on file  Social History Narrative   Pt lives  in a ground floor apartment in [place].   Social Drivers of Corporate Investment Banker Strain: Low Risk  (11/08/2023)   Overall Financial Resource Strain (CARDIA)    Difficulty of Paying Living Expenses: Not very hard  Food Insecurity: Food Insecurity Present (05/23/2024)   Hunger Vital Sign    Worried About Running Out of Food in the Last Year: Sometimes true    Ran Out of Food in the Last Year: Sometimes true  Transportation Needs: No Transportation Needs (04/16/2024)   PRAPARE - Administrator, Civil Service (Medical): No    Lack of Transportation (Non-Medical): No  Physical Activity: Inactive (11/08/2023)   Exercise Vital Sign    Days of Exercise per Week: 0 days    Minutes of Exercise per Session: 0 min  Stress: No Stress Concern Present (11/08/2023)   [employer] of Occupational Health - Occupational Stress Questionnaire    Feeling of Stress : Not at all  Social Connections: Socially Isolated (11/08/2023)   Social Connection and Isolation Panel    Frequency of Communication with Friends and Family: Three times a week    Frequency of Social Gatherings with Friends and Family: Three times a week    Attends Religious Services: Never    Active Member of Clubs or Organizations: No    Attends Banker Meetings: Never    Marital Status: Never married    ECOG Status: {CHL ONC ECOG ED:8845999799}  Review of Systems: A 12 point ROS discussed and pertinent positives are indicated in the HPI above.  All other systems are negative.  Review of Systems  Vital Signs: There were no vitals taken for this visit.  Physical Exam  Mallampati Score:     Imaging: MR Pelvis W Wo Contrast Result Date: 09/11/2024  EXAM: MRI OF THE PELVIS WITH AND WITHOUT INTRAVENOUS CONTRAST 09/07/2024 06:19:52 PM TECHNIQUE: Multiplanar magnetic resonance images of the pelvis with and without intravenous contrast. 8 mL of GADAVIST  was administered. COMPARISON: CT pelvis 07/09/2024.  CLINICAL HISTORY: Metastatic disease evaluation and sacral met on prior MRI. FINDINGS: SOFT TISSUES: The soft tissues are unremarkable. JOINTS: The sacroiliac joints appear unremarkable. No dislocation or significant effusion. LIMITED INTRAPELVIC CONTENTS: No acute abnormality. BONES: 0.9 cm lesion with high T2 and low T1 signal characteristics in the right iliac bone on image 14 series 4 compatible with metastatic disease. 3.7 x 2.6 cm left sacral metastatic lesion with high T2 and low T1 signal characteristics. This was substantially smaller on the PET CT from 04/09/2024, compatible with progression. 2.0 x 1.8 cm metastatic lesion of the left upper acetabulum on image 27 series 4 with high T2 and low T1 signal characteristics. These metastatic lesions demonstrate only low grade enhancement compared to the surrounding bony marrow. No sacral foraminal impingement currently identified. No impingement on the sacral plexus or proximal sciatic nerves. IMPRESSION: 1. Progression of the left sacral metastatic lesion, now measuring 3.7 x 2.6 cm, compared to PET CT from 04/09/24. 2. Additional metastatic lesions in the right iliac bone (0.9 cm) and left upper acetabulum (2.0 x 1.8 cm). 3. No sacral foraminal impingement or impingement on the sacral plexus or proximal sciatic nerves. Electronically signed by: Ryan Salvage MD 09/11/2024 01:15 PM EST RP Workstation: [id number]   MR Brain W Wo Contrast Addendum Date: 09/03/2024 ******** ADDENDUM #1 ******** ADDENDUM: This was discussed in multidisciplinary tumor board conference on 09/03/2024. The punctate enhancing focus in the right precentral gyrus has been treated with SRS in the interval since the 05/29/2024 study, and therefore the stability/decreased conspicuity of this focus may reflect a successfully treated metastasis. The punctate enhancing focus more superiorly at the right frontal vertex has not been treated and again could be vascular with continued  surveillance warranted. ---------------------------------------------------- Electronically signed by: Dasie Hamburg MD 09/03/2024 08:56 AM EST RP Workstation: [id number]   Result Date: 09/03/2024 ******** ORIGINAL REPORT ******** EXAM: MRI BRAIN WITH AND WITHOUT CONTRAST 08/31/2024 06:28:49 PM TECHNIQUE: Multiplanar multisequence MRI of the head/brain was performed with and without the administration of intravenous contrast. CONTRAST: 10 mL Gadavist . COMPARISON: MR Head 05/29/2024 and 04/06/2024. CLINICAL HISTORY: Metastatic disease evaluation. Non small cell lung cancer. FINDINGS: LIMITATIONS: The examination is motion degraded, including up to moderate motion artifact on postcontrast imaging. BRAIN AND VENTRICLES: There is no evidence of an acute infarct, midline shift, or extra axial fluid collection. 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 There is increased mass effect on the midbrain tectum and cerebral aqueduct without associated obstructive hydrocephalus. The punctate foci of enhancement in the right superior frontal gyrus near the vertex (series 751 image 272) and right precentral gyrus (series 751 image 239) are less conspicuous on the current examination (at least in part due to motion artifact today) and were also present on the 04/06/2024 study without interval progression. The 3 mm focus of enhancement along the anterior parasagittal left frontal lobe on the prior study is also less conspicuous and felt to be vascular. No new enhancing brain lesions are identified. A punctate focus of susceptibility in the cerebellar vermis is unchanged and may reflect chronic microhemorrhage or calcification. There is also a small amount of calcification or chronic blood products associated with the  pineal mass. Normal flow voids. ORBITS: No acute abnormality. SINUSES: Chronic large nasal septal defect. Mild  mucosal thickening in the paranasal sinuses. Clear mastoid air cells. BONES AND SOFT TISSUES: Normal bone marrow signal and enhancement. Unchanged 2.7 cm cystic right parotid tail mass and subcentimeter left parotid mass. IMPRESSION: 1. 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 2. The punctate foci of enhancement in the high right frontal lobe and along the anterior parasagittal left frontal lobe are all less conspicuous on today's motion-degraded examination and favored to be vascular. Electronically signed by: Dasie Hamburg MD 09/03/2024 06:58 AM EST RP Workstation: [id number]   MR Lumbar Spine W Wo Contrast Result Date: 09/03/2024 EXAM: MRI LUMBAR SPINE 08/31/2024 06:29:34 PM TECHNIQUE: Multiplanar multisequence MRI of the lumbar spine was performed with and without the administration of intravenous contrast. CONTRAST: 10 mL Gadavist . COMPARISON: MR Lumbar Spine 04/15/2017. CT chest, abdomen, and pelvis 07/09/2024. PET-CT 04/09/2024. CLINICAL HISTORY: Metastatic disease evaluation, back pain. Lung cancer. FINDINGS: Multiple sequences are moderately to severely motion degraded despite repeat imaging. BONES AND ALIGNMENT: 5 lumbar type vertebrae. Normal alignment. Large, heterogeneously enhancing lesion involving nearly the entirety of the L2 vertebral body, eccentric to the left, with extension into the left pedicle and with a left sided superior endplate compression fracture demonstrating 20% vertebral body height loss. No retropulsion or epidural tumor. Partially visualized 3.7 cm enhancing lesion in the left lateral sacrum at the S3 level. SPINAL CORD: The conus medullaris terminates at L1 and is normal in signal. SOFT TISSUES: No paraspinal mass. DISC LEVELS: Mild lumbar spondylosis with mild multilevel disc bulging and mild facet hypertrophy. No significant spinal stenosis. Mild bilateral neural foraminal stenosis at L4-L5.  IMPRESSION: 1. 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 Partially visualized left sacral metastasis. These have progressed from the 04/09/2024 PET-CT. 2. Mild lumbar spondylosis without compressive stenosis. Electronically signed by: Dasie Hamburg MD 09/03/2024 06:59 AM EST RP Workstation: [id number]    Labs:  CBC: Recent Labs    08/15/24 0841 08/29/24 1514 09/03/24 1450 09/05/24 0904  WBC 4.3 4.7 5.5 5.0  HGB 8.3* 8.2* 8.6* 8.4*  HCT 25.1* 24.8* 26.3* 25.8*  PLT 357 145* 164 147*    COAGS: No results for input(s): INR, APTT in the last 8760 hours.  BMP: Recent Labs    08/15/24 0841 08/29/24 1514 09/03/24 1450 09/05/24 0904  NA 140 140 140 139  K 3.3* 3.2* 3.6 3.5  CL 108 107 106 105  CO2 24 25 26 26   GLUCOSE 85 119* 82 86  BUN 14 16 13 17   CALCIUM  9.6 9.1 9.6 9.1  CREATININE 1.16 1.15 1.22 1.19  GFRNONAA >60 >60 >60 >60    LIVER FUNCTION TESTS: Recent Labs    08/15/24 0841 08/29/24 1514 09/03/24 1450 09/05/24 0904  BILITOT 0.3 0.3 0.4 0.4  AST 19 19 18 18   ALT 10 7 7 6   ALKPHOS 64 55 60 58  PROT 7.9 7.5 7.9 7.9  ALBUMIN 3.6 3.5 3.7 3.7    TUMOR MARKERS: No results for input(s): AFPTM, CEA, CA199, CHROMGRNA in the last 8760 hours.  Assessment and Plan:  ***  Thank you for this interesting consult.  I greatly enjoyed meeting Benjamin Herrera and look forward to participating in their care.  A copy of this report was sent to the requesting provider on this date.  Electronically Signed:  Thom  Chanel Mckesson, MD [facility]   Pager. [phone] Clinic. [phone]  I spent a total of {New INPT:304952001} {New Out-Pt:304952002}  {Established Out-Pt:304952003} in face to face in clinical consultation, greater than 50% of which was counseling/coordinating care for ***

## 2024-09-12 ENCOUNTER — Telehealth: Payer: Self-pay | Admitting: Radiation Oncology

## 2024-09-12 ENCOUNTER — Ambulatory Visit
Admission: RE | Admit: 2024-09-12 | Discharge: 2024-09-12 | Disposition: A | Source: Ambulatory Visit | Attending: Radiation Oncology

## 2024-09-12 ENCOUNTER — Ambulatory Visit
Admission: RE | Admit: 2024-09-12 | Discharge: 2024-09-12 | Disposition: A | Discharge status: Home / Self Care | Source: Ambulatory Visit | Attending: Acute Care | Admitting: Acute Care

## 2024-09-12 ENCOUNTER — Other Ambulatory Visit

## 2024-09-12 ENCOUNTER — Other Ambulatory Visit: Payer: Self-pay

## 2024-09-12 ENCOUNTER — Encounter: Payer: Self-pay | Admitting: Oncology

## 2024-09-12 ENCOUNTER — Encounter: Payer: Self-pay | Admitting: Internal Medicine

## 2024-09-12 ENCOUNTER — Ambulatory Visit
Admission: RE | Admit: 2024-09-12 | Discharge: 2024-09-12 | Disposition: A | Discharge status: Home / Self Care | Source: Ambulatory Visit | Attending: Radiation Oncology | Admitting: Radiation Oncology

## 2024-09-12 ENCOUNTER — Inpatient Hospital Stay: Admission: RE | Admit: 2024-09-12 | Source: Ambulatory Visit

## 2024-09-12 DIAGNOSIS — S32000A Wedge compression fracture of unspecified lumbar vertebra, initial encounter for closed fracture: Secondary | ICD-10-CM

## 2024-09-12 DIAGNOSIS — C7951 Secondary malignant neoplasm of bone: Secondary | ICD-10-CM | POA: Diagnosis not present

## 2024-09-12 DIAGNOSIS — C775 Secondary and unspecified malignant neoplasm of intrapelvic lymph nodes: Secondary | ICD-10-CM

## 2024-09-12 DIAGNOSIS — M8448XA Pathological fracture, other site, initial encounter for fracture: Secondary | ICD-10-CM | POA: Diagnosis not present

## 2024-09-12 DIAGNOSIS — Z5112 Encounter for antineoplastic immunotherapy: Secondary | ICD-10-CM | POA: Diagnosis not present

## 2024-09-12 HISTORY — PX: IR RADIOLOGIST EVAL & MGMT: IMG5224

## 2024-09-12 LAB — RAD ONC ARIA SESSION SUMMARY
Course Elapsed Days: 9
Plan Fractions Treated to Date: 7
Plan Prescribed Dose Per Fraction: 3 Gy
Plan Total Fractions Prescribed: 10
Plan Total Prescribed Dose: 30 Gy
Reference Point Dosage Given to Date: 21 Gy
Reference Point Session Dosage Given: 3 Gy
Session Number: 7

## 2024-09-12 NOTE — Telephone Encounter (Signed)
 I called to review the patient's recent MRI scan and to let him know I was sending the results to interventional radiology. He missed his appointment today due to his ride not being able to take him as far as the imaging facility. He is going to reschedule this to meet with Dr. Hughes about sacroplasty. He also has what appears to be new disease in the left acetabulum and in the last week has started to have pain in that left hip. I let him know I would see if Dr. Dewey thinks that should also be treated with radiotherapy, and ask if we should also have him meet with orthopedic oncology.

## 2024-09-12 NOTE — Progress Notes (Signed)
 Reason for visit: Symptomatic pathologic fracture at L2. L pelvic lesion on MRI   Care Team(s): Primary Care; [name], MD Medical Oncology; [name] NP and Dr Gatha Radiation Oncology;  [name], PA-C and Dr. Dewey Palliative Medicine; Pickenpack-Cousar, [name], NP   Virtual Visit via Telephone Note   I connected with Benjamin Herrera on 09/12/24 by telephone and verified that I am speaking with the correct person using two identifiers. I discussed the limitations, risks, security and privacy concerns of performing an evaluation and management service by telephone and the availability of in-person appointments.  History of Present Illness:  Benjamin Herrera is a 58 y.o. male comorbid including HIV, CAD w prior MI s/p DES (2017), former smoker w 42 PY Hx and metastatic lung CA. Pt is closely followed by his Medical Oncology, Palliative Medicine and Radiation Oncology teams. Staging PET CT (04/09/24) demonstrated multifocal osseous hypermetabolic lesions, specifically within his spine at L2 vertebral body and at L sacrum. Through the course of his treatments, Pt had worsening back pain and MR L spine (08/31/24) was performed confirming a pathologic fracture at L2 w 20% height loss. Additionally, the sacral lesion had significant edema and a MR Pelvis (09/12/24) was performed, demonstrating progression since the PET CT. He has since completed palliative radiation to his lumbar spine (10 fractions, total 30 Gy dose).  He was referred to me for evaluation for a potential minimally invasive solution to control his back pain and L sacral pain. He was supposed to present to clinic in-person but could not make it secondary to transportation issues. A virtual visit was offered instead.   He describes, a constant, sharp and focused, low-back and L pelvic pain, 10/10 at it's worst and terrible. He reports mild relief with PO pain relief and an analgesic patch. He  reports a decrease in QoL given the constant pain, but is able to mantain ambulation and ADLs without aid.    Review of Systems: A 12-point ROS discussed, and pertinent positives are indicated in the HPI above.  All other systems are negative.  Past Medical History:  Diagnosis Date   Achalasia and cardiospasm    Allergy    Anxiety    Arthritis    back ,shoulders    Asthma    Boils    under arms hx of   CAD (coronary artery disease)    a. 09/2016: cath showing 100% RCA occlusion with collaterals and 90% OM1 (treated with DES)   Candidiasis of mouth    Candidiasis of unspecified site    Cholelithiasis    Cocaine abuse (HCC)    Quit 09/2016   Condyloma acuminatum in male of scrotum & anal canal s/p laser ablation 07/03/2012   Coronary artery disease    Dysphagia    Fracture, humerus    Frozen shoulder    left   GERD (gastroesophageal reflux disease)    Heart attack (HCC)    Hemorrhoids    Hepatitis 1993   A   Herpes labialis    HIV (human immunodeficiency virus infection) (HCC) DX 1993   Hypertension Dx 2014   off all bp meds for last 9 or 10 months   Pilonidal disease 01/10/2012   Polysubstance abuse (HCC)    Psoriasis    hx of   Sleep apnea    does  have CPAP   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/03/2012   Squamous cell carcinoma  in situ of skin of perineum near scrotum 08/03/2012    Past Surgical History:  Procedure Laterality Date   BALLOON DILATION N/A 11/11/2015   Procedure: BALLOON DILATION;  Surgeon: Gustav Shila GAILS, MD;  Location: [facility];  Service: Endoscopy;  Laterality: N/A;   BOTOX  INJECTION  10/11/2012   Procedure: BOTOX  INJECTION;  Surgeon: Lamar JONETTA Aho, MD;  Location: [facility];  Service: Endoscopy;  Laterality: N/A;   BOTOX  INJECTION N/A 01/25/2013   Procedure: BOTOX  INJECTION;  Surgeon: Lamar JONETTA Aho, MD;  Location: [facility];  Service: Endoscopy;  Laterality: N/A;   BOTOX  INJECTION N/A  06/21/2013   Procedure: BOTOX  INJECTION;  Surgeon: Lamar JONETTA Aho, MD;  Location: [facility];  Service: Endoscopy;  Laterality: N/A;   BOTOX  INJECTION N/A 10/08/2013   Procedure: BOTOX  INJECTION;  Surgeon: Lamar JONETTA Aho, MD;  Location: [facility];  Service: Endoscopy;  Laterality: N/A;   BOTOX  INJECTION N/A 06/16/2015   Procedure: BOTOX  INJECTION;  Surgeon: Lamar JONETTA Aho, MD;  Location: [facility];  Service: Endoscopy;  Laterality: N/A;   BRONCHIAL BIOPSY  04/03/2024   Procedure: BRONCHOSCOPY, WITH BIOPSY;  Surgeon: Shelah Lamar RAMAN, MD;  Location: [facility];  Service: Pulmonary;;   BRONCHIAL BRUSHINGS  04/03/2024   Procedure: BRONCHOSCOPY, WITH BRUSH BIOPSY;  Surgeon: Shelah Lamar RAMAN, MD;  Location: [facility];  Service: Pulmonary;;   BRONCHIAL NEEDLE ASPIRATION BIOPSY  04/03/2024   Procedure: BRONCHOSCOPY, WITH NEEDLE ASPIRATION BIOPSY;  Surgeon: Shelah Lamar RAMAN, MD;  Location: [facility];  Service: Pulmonary;;   BRONCHOSCOPY, WITH BIOPSY USING ELECTROMAGNETIC NAVIGATION Right 04/03/2024   Procedure: BRONCHOSCOPY, WITH BIOPSY USING ELECTROMAGNETIC NAVIGATION;  Surgeon: Shelah Lamar RAMAN, MD;  Location: [facility];  Service: Pulmonary;  Laterality: Right;   CARDIAC CATHETERIZATION N/A 10/11/2016   Procedure: Left Heart Cath and Coronary Angiography;  Surgeon: Lonni JONETTA Cash, MD;  mRCA 100% w/ L>R collaterals, OM1 90%   CARDIAC CATHETERIZATION N/A 10/11/2016   Procedure: Coronary Stent Intervention;  Surgeon: Lonni JONETTA Cash, MD;  RESOLUTE ONYX 3.5X15 DES OM1   COLONOSCOPY  05/19/2012   Procedure: COLONOSCOPY;  Surgeon: Lamar JONETTA Aho, MD;  Location: [facility];  Service: Endoscopy;  Laterality: N/A;  jill trying to contact pt to come in 0830 for 930 case, phone not accepting messages   COLONOSCOPY     ESOPHAGEAL MANOMETRY  05/29/2012   Procedure: ESOPHAGEAL MANOMETRY (EM);  Surgeon: Lamar JONETTA Aho, MD;  Location: [facility];  Service: Endoscopy;  Laterality: N/A;    ESOPHAGEAL MANOMETRY N/A 10/06/2015   Procedure: ESOPHAGEAL MANOMETRY (EM);  Surgeon: Gustav Shila GAILS, MD;  Location: [facility];  Service: Endoscopy;  Laterality: N/A;   ESOPHAGOGASTRODUODENOSCOPY  11/11/2011   Procedure: ESOPHAGOGASTRODUODENOSCOPY (EGD);  Surgeon: Lamar JONETTA Aho, MD;  Location: [facility];  Service: Endoscopy;  Laterality: N/A;  botox  injection  called Pt to change time of procedure per Dr Aho   ESOPHAGOGASTRODUODENOSCOPY  03/10/2012   Procedure: ESOPHAGOGASTRODUODENOSCOPY (EGD);  Surgeon: Lamar JONETTA Aho, MD;  Location: [facility];  Service: Endoscopy;  Laterality: N/A;   ESOPHAGOGASTRODUODENOSCOPY  10/11/2012   Procedure: ESOPHAGOGASTRODUODENOSCOPY (EGD);  Surgeon: Lamar JONETTA Aho, MD;  Location: [facility];  Service: Endoscopy;  Laterality: N/A;   ESOPHAGOGASTRODUODENOSCOPY N/A 01/25/2013   Procedure: ESOPHAGOGASTRODUODENOSCOPY (EGD);  Surgeon: Lamar JONETTA Aho, MD;  Location: [facility];  Service: Endoscopy;  Laterality: N/A;   ESOPHAGOGASTRODUODENOSCOPY N/A 06/21/2013   Procedure: ESOPHAGOGASTRODUODENOSCOPY (EGD);  Surgeon: Lamar JONETTA Aho, MD;  Location: [facility];  Service: Endoscopy;  Laterality: N/A;  ESOPHAGOGASTRODUODENOSCOPY (EGD) WITH PROPOFOL  N/A 10/08/2013   Procedure: ESOPHAGOGASTRODUODENOSCOPY (EGD) WITH PROPOFOL ;  Surgeon: Lamar JONETTA Aho, MD;  Location: [facility];  Service: Endoscopy;  Laterality: N/A;   ESOPHAGOGASTRODUODENOSCOPY (EGD) WITH PROPOFOL  N/A 06/16/2015   Procedure: ESOPHAGOGASTRODUODENOSCOPY (EGD) WITH PROPOFOL ;  Surgeon: Lamar JONETTA Aho, MD;  Location: [facility];  Service: Endoscopy;  Laterality: N/A;   ESOPHAGOGASTRODUODENOSCOPY (EGD) WITH PROPOFOL  N/A 11/11/2015   Procedure: ESOPHAGOGASTRODUODENOSCOPY (EGD) WITH PROPOFOL ;  Surgeon: Gustav Shila GAILS, MD;  Location: [facility];  Service: Endoscopy;  Laterality: N/A;   EXAMINATION UNDER ANESTHESIA  07/24/2012   Procedure: EXAM UNDER ANESTHESIA;  Surgeon: Elspeth KYM Schultze,  MD;  Location: [facility];  Service: General;  Laterality: N/A;   humeral fracture surgery Right yrs ago   IR RADIOLOGIST EVAL & MGMT  09/12/2024   PILONIDAL CYST EXCISION  07/24/2012   Procedure: CYST EXCISION PILONIDAL SIMPLE;  Surgeon: Elspeth KYM Schultze, MD;  Location: [facility];  Service: General;  Laterality: N/A;  Exam Under Anesthesia,, Excision Pilonidal Disease,    right shoulder replacement  2008   x 2   UPPER GASTROINTESTINAL ENDOSCOPY     WART FULGURATION  07/24/2012   Procedure: FULGURATION ANAL WART;  Surgeon: Elspeth KYM Schultze, MD;  Location: [facility];  Service: General;  Laterality: N/A;  excision of raphe mass    Allergies: Citrus  Medications: Prior to Admission medications   Medication Sig Start Date End Date Taking? Authorizing Provider  acetaminophen  (TYLENOL ) 325 MG tablet Take 650 mg by mouth every 6 (six) hours as needed for mild pain (pain score 1-3), moderate pain (pain score 4-6) or headache.    [provider]  albuterol  (VENTOLIN  HFA) 108 (90 Base) MCG/ACT inhaler Inhale 2 puffs into the lungs every 6 (six) hours as needed for wheezing or shortness of breath. 04/07/21   Eben Reyes BROCKS, MD  amLODipine  (NORVASC ) 5 MG tablet Take 1 tablet (5 mg total) by mouth daily. Patient not taking: Reported on 09/10/2024 05/31/24 08/29/24  Mona Vinie BROCKS, MD  amoxicillin -clavulanate (AUGMENTIN ) 875-125 MG tablet Take 1 tablet by mouth 2 (two) times daily. 05/10/24   Boscia, Heather E, NP  aspirin  81 MG chewable tablet Chew 1 tablet (81 mg total) by mouth daily. 10/28/16   Funches, Josalyn, MD  Bictegravir-Emtricitab-Tenofov (BIKTARVY ) 30-120-15 MG TABS Take 1 tablet by mouth daily. 05/09/24   Eben Reyes BROCKS, MD  Blood Pressure KIT 1 kit by Does not apply route daily. 03/23/23   Vannie Reche RAMAN, NP  clindamycin  (CLEOCIN ) 300 MG capsule Take 1 capsule (300 mg total) by mouth 3 (three) times daily for 10 days. 09/10/24 09/20/24  Tobie Eldora NOVAK, MD  diclofenac  Sodium (VOLTAREN   ARTHRITIS PAIN) 1 % GEL Apply 2 g topically 4 (four) times daily. 01/05/24   Danton Merlon Alcorta HERO, PA-C  folic acid  (FOLVITE ) 1 MG tablet Take 1 tablet (1 mg total) by mouth daily. Start 7 days before pemetrexed  chemotherapy. Continue until 21 days after pemetrexed  completed. 08/15/24   Heilingoetter, Cassandra L, PA-C  hydrOXYzine  (ATARAX ) 25 MG tablet Take 1 tablet (25 mg total) by mouth 3 (three) times daily as needed. 05/09/24   Eben Reyes BROCKS, MD  lidocaine  (XYLOCAINE ) 2 % solution SWISH AND SWALLOW 5 MLS 3 (THREE) TIMES DAILY AS NEEDED FOR MOUTH PAIN. 05/09/24   Eben Reyes BROCKS, MD  lidocaine -prilocaine  (EMLA ) cream Apply to affected area once 04/16/24   Sherrod Sherrod, MD  LORazepam  (ATIVAN ) 0.5 MG tablet TAKE 1 TAB BY MOUTH 30 MINUTES PRIOR  TO RADIATION OR MRI SCANS 09/10/24   [name], PA-C  magic mouthwash (nystatin , diphenhydrAMINE , alum & mag hydroxide) suspension mixture Take 5 mLs by mouth 3 (three) times daily as needed for mouth pain. 08/29/24   Eben Reyes BROCKS, MD  methocarbamol  (ROBAXIN ) 500 MG tablet Take 2 tablets (1,000 mg total) by mouth every 8 (eight) hours as needed. 01/05/24   Danton Asjia Berrios HERO, PA-C  metoprolol  succinate (TOPROL -XL) 25 MG 24 hr tablet TAKE 1/2 TABLET BY MOUTH DAILY 04/03/24   Hilty, Vinie BROCKS, MD  nitroGLYCERIN  (NITROSTAT ) 0.4 MG SL tablet Place 1 tablet (0.4 mg total) under the tongue every 5 (five) minutes as needed for chest pain (trouble swallowing). 01/02/24   Craig Alan SAUNDERS, PA-C  nystatin  (MYCOSTATIN ) 100000 UNIT/ML suspension Take by mouth. 06/01/24   [provider]  ondansetron  (ZOFRAN ) 8 MG tablet Take 1 tablet (8 mg total) by mouth every 8 (eight) hours as needed for nausea or vomiting. Start on the third day after carboplatin . 04/16/24   Sherrod Sherrod, MD  oxyCODONE -acetaminophen  (PERCOCET/ROXICET) 5-325 MG tablet Take 1 tablet by mouth every 8 (eight) hours as needed for severe pain (pain score 7-10). 09/06/24    Pickenpack-Cousar, Athena N, NP  potassium chloride  SA (KLOR-CON  M) 20 MEQ tablet Take 1 tablet (20 mEq total) by mouth daily. 08/29/24   Heilingoetter, Cassandra L, PA-C  predniSONE  (DELTASONE ) 50 MG tablet Take one tablet 13 hours before , one tablet 7 hours before and one tablet one hour prior to CT scan. Take benadryl  50 mg one hour prior to scan 08/22/24   Sherrod Sherrod, MD  prochlorperazine  (COMPAZINE ) 10 MG tablet Take 1 tablet (10 mg total) by mouth every 6 (six) hours as needed for nausea or vomiting. 04/16/24   Sherrod Sherrod, MD  sulfamethoxazole -trimethoprim  (BACTRIM  DS) 800-160 MG tablet Take 1 tablet by mouth 3 (three) times a week. 06/20/24   Eben Reyes BROCKS, MD     Family History  Problem Relation Age of Onset   Arthritis Mother    Hypertension Mother    Heart disease Mother        MI age 37   Heart attack Sister    Diabetes Maternal Grandmother    Stomach cancer Paternal Grandmother    Colon cancer Neg Hx    Colon polyps Neg Hx    Esophageal cancer Neg Hx    Rectal cancer Neg Hx     Social History   Socioeconomic History   Marital status: Single    Spouse name: Not on file   Number of children: 0   Years of education: Not on file   Highest education level: Not on file  Occupational History   Occupation: Disabled  Tobacco Use   Smoking status: Former    Current packs/day: 1.00    Average packs/day: 1 pack/day for 42.9 years (42.9 ttl pk-yrs)    Types: Cigarettes    Start date: 1983   Smokeless tobacco: Never   Tobacco comments:    4 or 5 cigarettes a day     States quit one week ago 04/02/2024  Vaping Use   Vaping status: Never Used  Substance and Sexual Activity   Alcohol use: No    Alcohol/week: 0.0 standard drinks of alcohol   Drug use: Not Currently    Frequency: 3.0 times per week    Types: Marijuana   Sexual activity: Not Currently    Partners: Male    Comment: pt. given condoms, used marijuana -3  days ago 05/14/16  Other Topics Concern    Not on file  Social History Narrative   Pt lives in a ground floor apartment in [place].   Social Drivers of Corporate Investment Banker Strain: Low Risk  (11/08/2023)   Overall Financial Resource Strain (CARDIA)    Difficulty of Paying Living Expenses: Not very hard  Food Insecurity: Food Insecurity Present (05/23/2024)   Hunger Vital Sign    Worried About Running Out of Food in the Last Year: Sometimes true    Ran Out of Food in the Last Year: Sometimes true  Transportation Needs: No Transportation Needs (04/16/2024)   PRAPARE - Administrator, Civil Service (Medical): No    Lack of Transportation (Non-Medical): No  Physical Activity: Inactive (11/08/2023)   Exercise Vital Sign    Days of Exercise per Week: 0 days    Minutes of Exercise per Session: 0 min  Stress: No Stress Concern Present (11/08/2023)   [employer] of Occupational Health - Occupational Stress Questionnaire    Feeling of Stress : Not at all  Social Connections: Socially Isolated (11/08/2023)   Social Connection and Isolation Panel    Frequency of Communication with Friends and Family: Three times a week    Frequency of Social Gatherings with Friends and Family: Three times a week    Attends Religious Services: Never    Active Member of Clubs or Organizations: No    Attends Banker Meetings: Never    Marital Status: Never married    ECOG Status: 1 - Symptomatic but completely ambulatory  Review of Systems As above  Vital Signs: There were no vitals taken for this visit.  Physical Exam Musculoskeletal:       Back:     Comments: Reported persistent low back and L posterior pelvic pain    Deferred secondary to virtual visit  Imaging:  Contrasted MR L spine (08/31/24) and MR Pelvis (09/07/24) Imaging independently reviewed, demonstrating enhancing osseous lesions at L2 and L sacrum    MR L spine W WO (08/31/24)   IMPRESSION:  1. Large metastasis in the L2 vertebral body with  pathologic superior endplate compression fracture and 20% height loss.  Partially visualized left sacral metastasis. These have progressed from the 04/09/2024 PET-CT.  2. Mild lumbar spondylosis without compressive stenosis.  MR Pelvis W WO (09/07/24) IMPRESSION:  1. Progression of the left sacral metastatic lesion, now measuring 3.7 x 2.6 cm, compared to PET CT from 04/09/24.  2. Additional metastatic lesions in the right iliac bone (0.9 cm) and left upper acetabulum (2.0 x 1.8 cm).  3. No sacral foraminal impingement or impingement on the sacral plexus or proximal sciatic nerves.  CT CHEST ABDOMEN PELVIS W CONTRAST Result Date: 09/24/2024 CLINICAL DATA:  Non-small cell lung cancer. Ongoing left pelvic pain. Shortness of breath and weight loss. Constipation. * Tracking Code: BO * EXAM: CT CHEST, ABDOMEN, AND PELVIS WITH CONTRAST TECHNIQUE: Multidetector CT imaging of the chest, abdomen and pelvis was performed following the standard protocol during bolus administration of intravenous contrast. RADIATION DOSE REDUCTION: This exam was performed according to the departmental dose-optimization program which includes automated exposure control, adjustment of the mA and/or kV according to patient size and/or use of iterative reconstruction technique. CONTRAST:  OMNIPAQUE  IOHEXOL  300 MG/ML  SOLN COMPARISON:  07/09/2024 and PET 04/09/2024. FINDINGS: CT CHEST FINDINGS Cardiovascular: Atherosclerotic calcification of the aorta with age advanced involvement of the left anterior descending and circumflex coronary arteries. Heart  size normal. No pericardial effusion. Mediastinum/Nodes: Right low internal jugular and supraclavicular lymph nodes appear to have increased in size and number. 9 mm index lymph node (2/6), previously 7 mm. 7 mm right supraclavicular lymph node (2/6) appears new. No pathologically enlarged mediastinal or axillary lymph nodes. Hilar regions are difficult to definitively evaluate without  IV contrast. Air and fluid are seen in a dilated esophagus. Mid and lower esophageal wall is minimally thickened, as before. Lungs/Pleura: Spiculated mass in the medial aspect of the apical segment right upper lobe measures approximately 2.4 x 3.4 cm (4/27), with invasion of the superior mediastinum, grossly stable in size when remeasured in similar fashion. Centrilobular and paraseptal emphysema. Question postoperative scarring in the posterior segment right upper lobe. New peribronchovascular consolidation and ground-glass in the medial segment right middle lobe. Small right pleural effusion. 4 mm anterior segment left upper lobe nodule (4/69), as before. No pleural fluid. Airway is unremarkable. Musculoskeletal: Right shoulder arthroplasty. Slight compression of the T8 superior endplate, old. Similar healing or healed pathologic left proximal humerus fracture. Additional osseous metastatic disease better visualized on PET 04/09/2024. CT ABDOMEN PELVIS FINDINGS Hepatobiliary: Liver is unremarkable. Gallstone. No biliary ductal dilatation. Pancreas: Negative. Spleen: Negative. Adrenals/Urinary Tract: Right adrenal gland is unremarkable. New lateral limb left adrenal nodule measures 1.7 x 2.3 cm. Renal cortical thinning and slight scarring bilaterally. Tiny low-attenuation lesion in the right kidney, too small to characterize. No specific follow-up necessary. Possible vague low density in the anterior interpolar left kidney (7/22), not well seen on 07/09/2024. Ureters are decompressed. Bladder is relatively low in volume. Right posterolateral bladder diverticulum. Stomach/Bowel: Stomach, small bowel, appendix and colon are unremarkable. Vascular/Lymphatic: Atherosclerotic calcification of the aorta. No pathologically enlarged lymph nodes. Inguinal lymph nodes are not enlarged by CT size criteria. Reproductive: Prostate is normal in size. Other: No free fluid.  Mesenteries and peritoneum are unremarkable.  Musculoskeletal: Lucency and compression involving the L2 superior endplate, unchanged. Additional osseous metastatic disease, better seen on PET 04/09/2024. IMPRESSION: 1. New/enlarging low right internal jugular/right supraclavicular lymph nodes with a new left adrenal nodule, findings compatible with disease progression. 2. Spiculated right upper lobe mass is grossly stable in size. 3. Old pathologic proximal left humerus and L2 superior endplate fractures. Additional osseous metastatic disease, better visualized on PET 04/09/2024. 4. New peribronchovascular ground-glass and consolidation in the medial segment right upper lobe, likely infectious/inflammatory in etiology. 5. Esophageal dysmotility with similar mid and lower esophageal wall thickening, possibly due to radiation therapy. 6. Possible vague low-density in the anterior interpolar left kidney, not previously seen. Recommend attention on follow-up. 7.  Age advanced two vessel coronary artery calcification. 8.  Aortic atherosclerosis (ICD10-I70.0). Electronically Signed   By: Newell Eke M.D.   On: 09/24/2024 13:46   IR Radiologist Eval & Mgmt Result Date: 09/12/2024 EXAM: NEW PATIENT OFFICE VISIT CHIEF COMPLAINT: See below HISTORY OF PRESENT ILLNESS: See below REVIEW OF SYSTEMS: See below PHYSICAL EXAMINATION: See below ASSESSMENT AND PLAN: Please refer to completed note in the electronic medical record on  Epic Thom Hall, MD [facility] Electronically Signed   By: Thom Hall M.D.   On: 09/12/2024 14:22   MR Pelvis W Wo Contrast Result Date: 09/11/2024 EXAM: MRI OF THE PELVIS WITH AND WITHOUT INTRAVENOUS CONTRAST 09/07/2024 06:19:52 PM TECHNIQUE: Multiplanar magnetic resonance images of the pelvis with and without intravenous contrast. 8 mL of GADAVIST  was administered. COMPARISON: CT pelvis 07/09/2024. CLINICAL HISTORY: Metastatic disease evaluation and  sacral met on  prior MRI. FINDINGS: SOFT TISSUES: The soft tissues are unremarkable. JOINTS: The sacroiliac joints appear unremarkable. No dislocation or significant effusion. LIMITED INTRAPELVIC CONTENTS: No acute abnormality. BONES: 0.9 cm lesion with high T2 and low T1 signal characteristics in the right iliac bone on image 14 series 4 compatible with metastatic disease. 3.7 x 2.6 cm left sacral metastatic lesion with high T2 and low T1 signal characteristics. This was substantially smaller on the PET CT from 04/09/2024, compatible with progression. 2.0 x 1.8 cm metastatic lesion of the left upper acetabulum on image 27 series 4 with high T2 and low T1 signal characteristics. These metastatic lesions demonstrate only low grade enhancement compared to the surrounding bony marrow. No sacral foraminal impingement currently identified. No impingement on the sacral plexus or proximal sciatic nerves. IMPRESSION: 1. Progression of the left sacral metastatic lesion, now measuring 3.7 x 2.6 cm, compared to PET CT from 04/09/24. 2. Additional metastatic lesions in the right iliac bone (0.9 cm) and left upper acetabulum (2.0 x 1.8 cm). 3. No sacral foraminal impingement or impingement on the sacral plexus or proximal sciatic nerves. Electronically signed by: Ryan Salvage MD 09/11/2024 01:15 PM EST RP Workstation: [id number]   MR Lumbar Spine W Wo Contrast Result Date: 09/03/2024 EXAM: MRI LUMBAR SPINE 08/31/2024 06:29:34 PM TECHNIQUE: Multiplanar multisequence MRI of the lumbar spine was performed with and without the administration of intravenous contrast. CONTRAST: 10 mL Gadavist . COMPARISON: MR Lumbar Spine 04/15/2017. CT chest, abdomen, and pelvis 07/09/2024. PET-CT 04/09/2024. CLINICAL HISTORY: Metastatic disease evaluation, back pain. Lung cancer. FINDINGS: Multiple sequences are moderately to severely motion degraded despite repeat imaging. BONES AND ALIGNMENT: 5 lumbar type vertebrae. Normal alignment. Large,  heterogeneously enhancing lesion involving nearly the entirety of the L2 vertebral body, eccentric to the left, with extension into the left pedicle and with a left sided superior endplate compression fracture demonstrating 20% vertebral body height loss. No retropulsion or epidural tumor. Partially visualized 3.7 cm enhancing lesion in the left lateral sacrum at the S3 level. SPINAL CORD: The conus medullaris terminates at L1 and is normal in signal. SOFT TISSUES: No paraspinal mass. DISC LEVELS: Mild lumbar spondylosis with mild multilevel disc bulging and mild facet hypertrophy. No significant spinal stenosis. Mild bilateral neural foraminal stenosis at L4-L5. IMPRESSION: 1. 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 Partially visualized left sacral metastasis. These have progressed from the 04/09/2024 PET-CT. 2. Mild lumbar spondylosis without compressive stenosis. Electronically signed by: Dasie Hamburg MD 09/03/2024 06:59 AM EST RP Workstation: [id number]    Labs:  CBC: Recent Labs    08/15/24 0841 08/29/24 1514 09/03/24 1450 09/05/24 0904  WBC 4.3 4.7 5.5 5.0  HGB 8.3* 8.2* 8.6* 8.4*  HCT 25.1* 24.8* 26.3* 25.8*  PLT 357 145* 164 147*    COAGS: No results for input(s): INR, APTT in the last 8760 hours.  BMP: Recent Labs    08/15/24 0841 08/29/24 1514 09/03/24 1450 09/05/24 0904  NA 140 140 140 139  K 3.3* 3.2* 3.6 3.5  CL 108 107 106 105  CO2 24 25 26 26   GLUCOSE 85 119* 82 86  BUN 14 16 13 17   CALCIUM  9.6 9.1 9.6 9.1  CREATININE 1.16 1.15 1.22 1.19  GFRNONAA >60 >60 >60 >60    ONCOLOGY:   Cancer Staging  No matching staging information was found for the patient.  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 Multifocal osseous hypermetabolic lesions, specifically within his spine at L2 vertebral body and at L sacrum. Worsening back pain w MR L spine (08/31/24) confirming a pathologic fracture at L2 w 20% height loss, and MR Pelvis (09/12/24) w significant edema and progression at L sacrum since the PET CT. Pt completed palliative radiation to his lumbar spine (10 fractions, total 30 Gy dose). He is referred for palliative therapy with percutaneous ablation and stabilization with cement augmentation.  Ongoing, life-style limiting pain secondary to the pathologic fracture, refractory to conservative management. Pain on exam concordant with level of fracture. Failure of conservative therapy and pain refractory to pain mediation, and significant disability on the [employer] Questionnaire with positive symptoms, reflecting significant impact/impairment of (ADLs)   ICD-10-CM Codes that Support Medical Necessity (welshblog.at.aspx?articleId=[email or web address])  S32.020A Wedge compression fracture of second lumbar vertebra C79.51 Secondary malignant neoplasm of bone M84.48XA Pathological fracture, other site, init encntr for fracture  The Pt is interested in pursuing a minimally-invasive option for therapy of his spinal and L sacral metastases at this time, and is curious about Radiofrequency Ablation.  Risks were discussed including, but not limited to, bleeding, infection, cement migration which may cause spinal cord damage, paralysis, pulmonary embolism or even death. The procedure has been fully reviewed with the patient/patient's authorized representative. The risks, benefits and alternatives have been explained, and the  patient/patient's authorized representative has consented to the procedure.   *Pt is a candidate for LEFT Sacroplasty and L2 Percutaneous RFA Osteocool and Vertebral Augmentation with Balloon Kyphoplasty  *MR L spine (08/31/24) and MR Pelvis (09/12/24) reviewed. No additional imaging required. *Procedure to be performed at [facility] with me, expedite request given Pt discomfort *Will request Industry Representative presence ([employer] KP / Osteocool) *Same day procedure. Ancef for pre op Abx.  Thank you for this interesting consult.  I greatly enjoyed meeting Benjamin Herrera and look forward to participating in their care.  A copy of this report was sent to the requesting provider on this date.  Electronically Signed:  Thom Hall, MD [facility]   Pager. [phone] Clinic. [phone]  I spent a total of 45 Minutes of non-face-to-face time in clinical consultation, greater than 50% of which was counseling/coordinating care for Mr Benjamin Herrera'  evaluation for symptomatic L2 pathologic fracture and L sacral metastasis.

## 2024-09-12 NOTE — Telephone Encounter (Signed)
 I left a message for the patient to let him know about Dr. Smitty recommendation to proceed with radiation to the left acetabulum and the left side of the sacrum. It would be 10-15 fxns depending on his systemic therapy.

## 2024-09-13 ENCOUNTER — Other Ambulatory Visit: Payer: Self-pay

## 2024-09-13 ENCOUNTER — Ambulatory Visit
Admission: RE | Admit: 2024-09-13 | Discharge: 2024-09-13 | Disposition: A | Source: Ambulatory Visit | Attending: Radiation Oncology | Admitting: Radiation Oncology

## 2024-09-13 ENCOUNTER — Ambulatory Visit
Admission: RE | Admit: 2024-09-13 | Discharge: 2024-09-13 | Disposition: A | Discharge status: Home / Self Care | Source: Ambulatory Visit | Attending: Radiation Oncology | Admitting: Radiation Oncology

## 2024-09-13 DIAGNOSIS — R6889 Other general symptoms and signs: Secondary | ICD-10-CM | POA: Diagnosis not present

## 2024-09-13 DIAGNOSIS — C7931 Secondary malignant neoplasm of brain: Secondary | ICD-10-CM | POA: Diagnosis not present

## 2024-09-13 DIAGNOSIS — Z5112 Encounter for antineoplastic immunotherapy: Secondary | ICD-10-CM | POA: Diagnosis not present

## 2024-09-13 LAB — RAD ONC ARIA SESSION SUMMARY
Course Elapsed Days: 10
Course Elapsed Days: 10
Plan Fractions Treated to Date: 1
Plan Fractions Treated to Date: 8
Plan Prescribed Dose Per Fraction: 20 Gy
Plan Prescribed Dose Per Fraction: 3 Gy
Plan Total Fractions Prescribed: 1
Plan Total Fractions Prescribed: 10
Plan Total Prescribed Dose: 20 Gy
Plan Total Prescribed Dose: 30 Gy
Reference Point Dosage Given to Date: 20 Gy
Reference Point Dosage Given to Date: 24 Gy
Reference Point Session Dosage Given: 20 Gy
Reference Point Session Dosage Given: 3 Gy
Session Number: 8
Session Number: 9

## 2024-09-13 NOTE — Progress Notes (Signed)
 Name: Benjamin Herrera    MRN: [id number]   Date: 09/13/2024    DOB: [birth year]   STEREOTACTIC RADIOSURGERY OPERATIVE NOTE  PRE-OPERATIVE DIAGNOSIS:  Metastatic non-small cell lung adenocarcinoma  POST-OPERATIVE DIAGNOSIS:  Same  PROCEDURE:  Stereotactic Radiosurgery  SURGEON:  Gerldine Maizes, MD  RADIATION ONCOLOGIST: Dr. Norleen Limes, MD  TECHNIQUE:  The patient underwent a radiation treatment planning session in the radiation oncology simulation suite under the care of the radiation oncology physician and physicist.  I participated closely in the radiation treatment planning afterwards. The patient underwent planning CT which was fused to 3T high resolution MRI with 1 mm axial slices.  These images were fused on the planning system.  Radiation oncology contoured the gross target volume and subsequently expanded this to yield the Planning Target Volume. I actively participated in the planning process.  I helped to define and review the target contours and also the contours of the optic pathway, eyes, brainstem and selected nearby organs at risk.  All the dose constraints for critical structures were reviewed and compared to AAPM Task Group 101.  The prescription dose conformity was reviewed.  I approved the plan electronically.    Accordingly, Benjamin Herrera was brought to the TrueBeam stereotactic radiation treatment linac and placed in the custom immobilization mask.  The patient was aligned according to the IR fiducial markers with BrainLab Exactrac, then orthogonal x-rays were used in ExacTrac with the 6DOF robotic table and the shifts were made to align the patient  This lesion was complex because it was adjacent to the brainstem.  Benjamin Herrera received stereotactic radiosurgery uneventfully to the pineal region tumor to a prescription dose of 20Gy.  The detailed description of the procedure is recorded in the radiation oncology procedure note.  I was present for the duration of the  procedure.  DISPOSITION:  Following delivery, the patient was transported to nursing in stable condition and monitored for possible acute effects to be discharged to home in stable condition with follow-up in one month.  Gerldine Maizes, MD [facility]

## 2024-09-14 ENCOUNTER — Encounter: Payer: Self-pay | Admitting: Internal Medicine

## 2024-09-14 ENCOUNTER — Ambulatory Visit

## 2024-09-14 ENCOUNTER — Telehealth: Payer: Self-pay | Admitting: Radiation Oncology

## 2024-09-14 DIAGNOSIS — C3411 Malignant neoplasm of upper lobe, right bronchus or lung: Secondary | ICD-10-CM | POA: Diagnosis not present

## 2024-09-14 DIAGNOSIS — Z5112 Encounter for antineoplastic immunotherapy: Secondary | ICD-10-CM | POA: Diagnosis not present

## 2024-09-14 DIAGNOSIS — C7951 Secondary malignant neoplasm of bone: Secondary | ICD-10-CM | POA: Diagnosis not present

## 2024-09-14 DIAGNOSIS — Z510 Encounter for antineoplastic radiation therapy: Secondary | ICD-10-CM | POA: Diagnosis not present

## 2024-09-14 DIAGNOSIS — Z87891 Personal history of nicotine dependence: Secondary | ICD-10-CM | POA: Diagnosis not present

## 2024-09-14 DIAGNOSIS — R6889 Other general symptoms and signs: Secondary | ICD-10-CM | POA: Diagnosis not present

## 2024-09-14 NOTE — Telephone Encounter (Signed)
 11/21 Patient left voicemail to cancel treatment appt on today, due to being very sick.  Email forward to L2 machine and copied Support RTT/Nursing, so they are aware.

## 2024-09-16 LAB — AEROBIC/ANAEROBIC CULTURE W GRAM STAIN (SURGICAL/DEEP WOUND)

## 2024-09-17 ENCOUNTER — Ambulatory Visit: Admitting: Radiation Oncology

## 2024-09-17 ENCOUNTER — Ambulatory Visit
Admission: RE | Admit: 2024-09-17 | Discharge: 2024-09-17 | Disposition: A | Discharge status: Home / Self Care | Source: Ambulatory Visit | Attending: Radiation Oncology | Admitting: Radiation Oncology

## 2024-09-17 ENCOUNTER — Other Ambulatory Visit: Payer: Self-pay

## 2024-09-17 DIAGNOSIS — Z5112 Encounter for antineoplastic immunotherapy: Secondary | ICD-10-CM | POA: Diagnosis not present

## 2024-09-17 LAB — RAD ONC ARIA SESSION SUMMARY
Course Elapsed Days: 14
Plan Fractions Treated to Date: 9
Plan Prescribed Dose Per Fraction: 3 Gy
Plan Total Fractions Prescribed: 10
Plan Total Prescribed Dose: 30 Gy
Reference Point Dosage Given to Date: 27 Gy
Reference Point Session Dosage Given: 3 Gy
Session Number: 10

## 2024-09-18 ENCOUNTER — Ambulatory Visit

## 2024-09-18 ENCOUNTER — Ambulatory Visit (HOSPITAL_COMMUNITY)
Admission: RE | Admit: 2024-09-18 | Discharge: 2024-09-18 | Disposition: A | Discharge status: Home / Self Care | Source: Ambulatory Visit | Attending: Internal Medicine | Admitting: Internal Medicine

## 2024-09-18 ENCOUNTER — Ambulatory Visit: Admitting: Radiation Oncology

## 2024-09-18 DIAGNOSIS — R6889 Other general symptoms and signs: Secondary | ICD-10-CM | POA: Diagnosis not present

## 2024-09-18 DIAGNOSIS — C3490 Malignant neoplasm of unspecified part of unspecified bronchus or lung: Secondary | ICD-10-CM | POA: Insufficient documentation

## 2024-09-18 DIAGNOSIS — R1319 Other dysphagia: Secondary | ICD-10-CM | POA: Diagnosis not present

## 2024-09-18 DIAGNOSIS — C3491 Malignant neoplasm of unspecified part of right bronchus or lung: Secondary | ICD-10-CM | POA: Diagnosis not present

## 2024-09-18 DIAGNOSIS — R590 Localized enlarged lymph nodes: Secondary | ICD-10-CM | POA: Diagnosis not present

## 2024-09-18 DIAGNOSIS — K224 Dyskinesia of esophagus: Secondary | ICD-10-CM | POA: Diagnosis not present

## 2024-09-18 DIAGNOSIS — C7951 Secondary malignant neoplasm of bone: Secondary | ICD-10-CM | POA: Diagnosis not present

## 2024-09-18 MED ORDER — SODIUM CHLORIDE (PF) 0.9 % IJ SOLN
INTRAMUSCULAR | Status: AC
  Filled 2024-09-18: qty 50

## 2024-09-18 MED ORDER — IOHEXOL 300 MG/ML  SOLN
100.0000 mL | Freq: Once | INTRAMUSCULAR | Status: AC | PRN
  Administered 2024-09-18: 100 mL via INTRAVENOUS

## 2024-09-19 ENCOUNTER — Other Ambulatory Visit: Payer: Self-pay

## 2024-09-19 ENCOUNTER — Ambulatory Visit

## 2024-09-19 ENCOUNTER — Ambulatory Visit
Admission: RE | Admit: 2024-09-19 | Discharge: 2024-09-19 | Disposition: A | Discharge status: Home / Self Care | Source: Ambulatory Visit | Attending: Radiation Oncology | Admitting: Radiation Oncology

## 2024-09-19 DIAGNOSIS — R6889 Other general symptoms and signs: Secondary | ICD-10-CM | POA: Diagnosis not present

## 2024-09-19 DIAGNOSIS — Z5112 Encounter for antineoplastic immunotherapy: Secondary | ICD-10-CM | POA: Diagnosis not present

## 2024-09-19 DIAGNOSIS — Z510 Encounter for antineoplastic radiation therapy: Secondary | ICD-10-CM | POA: Diagnosis not present

## 2024-09-19 DIAGNOSIS — C3411 Malignant neoplasm of upper lobe, right bronchus or lung: Secondary | ICD-10-CM | POA: Diagnosis not present

## 2024-09-19 DIAGNOSIS — C7951 Secondary malignant neoplasm of bone: Secondary | ICD-10-CM | POA: Diagnosis not present

## 2024-09-19 DIAGNOSIS — C7931 Secondary malignant neoplasm of brain: Secondary | ICD-10-CM | POA: Diagnosis not present

## 2024-09-19 LAB — RAD ONC ARIA SESSION SUMMARY
Course Elapsed Days: 16
Plan Fractions Treated to Date: 10
Plan Prescribed Dose Per Fraction: 3 Gy
Plan Total Fractions Prescribed: 10
Plan Total Prescribed Dose: 30 Gy
Reference Point Dosage Given to Date: 30 Gy
Reference Point Session Dosage Given: 3 Gy
Session Number: 11

## 2024-09-21 DIAGNOSIS — R6889 Other general symptoms and signs: Secondary | ICD-10-CM | POA: Diagnosis not present

## 2024-09-24 ENCOUNTER — Other Ambulatory Visit: Payer: Self-pay

## 2024-09-24 ENCOUNTER — Other Ambulatory Visit

## 2024-09-24 ENCOUNTER — Telehealth: Payer: Self-pay | Admitting: *Deleted

## 2024-09-24 ENCOUNTER — Ambulatory Visit
Admission: RE | Admit: 2024-09-24 | Discharge: 2024-09-24 | Disposition: A | Discharge status: Home / Self Care | Source: Ambulatory Visit | Attending: Radiation Oncology | Admitting: Radiation Oncology

## 2024-09-24 ENCOUNTER — Encounter: Payer: Self-pay | Admitting: Radiation Oncology

## 2024-09-24 DIAGNOSIS — R6889 Other general symptoms and signs: Secondary | ICD-10-CM | POA: Diagnosis not present

## 2024-09-24 DIAGNOSIS — Z5112 Encounter for antineoplastic immunotherapy: Secondary | ICD-10-CM | POA: Diagnosis not present

## 2024-09-24 DIAGNOSIS — Z510 Encounter for antineoplastic radiation therapy: Secondary | ICD-10-CM | POA: Diagnosis not present

## 2024-09-24 DIAGNOSIS — Z87891 Personal history of nicotine dependence: Secondary | ICD-10-CM | POA: Insufficient documentation

## 2024-09-24 DIAGNOSIS — C3411 Malignant neoplasm of upper lobe, right bronchus or lung: Secondary | ICD-10-CM | POA: Diagnosis not present

## 2024-09-24 DIAGNOSIS — C7951 Secondary malignant neoplasm of bone: Secondary | ICD-10-CM | POA: Diagnosis not present

## 2024-09-24 DIAGNOSIS — C7931 Secondary malignant neoplasm of brain: Secondary | ICD-10-CM | POA: Insufficient documentation

## 2024-09-24 LAB — RAD ONC ARIA SESSION SUMMARY
Course Elapsed Days: 21
Plan Fractions Treated to Date: 1
Plan Prescribed Dose Per Fraction: 3 Gy
Plan Total Fractions Prescribed: 10
Plan Total Prescribed Dose: 30 Gy
Reference Point Dosage Given to Date: 3 Gy
Reference Point Session Dosage Given: 3 Gy
Session Number: 12

## 2024-09-24 NOTE — Progress Notes (Addendum)
 Pt is a known gentleman with stage IV lung cancer receiving radiation who was seen today after first treatment to the left pelvis. He will be seeing orthopedic oncology at [facility] in [facility] as he is established there with Dr. Emeline and is to see her on 10/11/24 to discuss pinning his left acetabulum. He has noticed improvement in his low back pain since finishing radiation last week. He also just finished a treatment with SRS to the brain the week prior. In that time frame  he also he also went to see ENT for his parotid tail lesion that was enlarging and noted to have an infection. The site was aspirated and treated with Clindamycin . He also has a history of HIV and is under the care of Dr. Eben. He reports he has been having pain in the gluteal cleft region for the last week or so and the area is moist and  has some small amounts of drainage. He denies any fevers or bleeding. He has a history of AIN and is HIV positive and a current smoker. He has a documented history as well of pilonidal cyst in his chart as well. He's not been seen since 2023 in colorectal surgery but is hopeful he can be seen soon.   On exam he has separation of the skin in the gluteal cleft with hyperemic changes that are slightly nodular and painful to light touch. The site is about 3 cm in length and separates about 1 cm. There is fibrin vs. necrotic drainage seen. No erythema of the skin was otherwise noted or other purulent change.  Along the right aspect of the perineum is another lesion that is slightly hyperemic but intact without erosion or skin separation that measures about 6 mm in greatest dimension.        I was able to reach the triage team at [facility] and he will see one of the PAs in clinic tomorrow and likely be evaluated with Dr. Sheldon in the near future as well. I let him know I would copy Dr. Eben as well. We will otherwise continue his treatment.      Donald KYM Husband,  PAC

## 2024-09-24 NOTE — Telephone Encounter (Signed)
 Spoke with the patient to let him know we were able to get him an appointment with the Orthopedic Oncology October 11, 2024 at 1:30 pm.  He verbalized understanding.  No questions at this time.

## 2024-09-24 NOTE — Progress Notes (Deleted)
 [facility] OFFICE PROGRESS NOTE  Benjamin Clam, MD [address] [place] [address] [place] [address]  DIAGNOSIS: Stage IV (T3, N3, M1 C) non-small cell lung cancer, adenocarcinoma presented with large right upper lobe lung mass in addition to right hilar and mediastinal as well as right supraclavicular lymphadenopathy and metastatic bone lesions including the left proximal humerus, L2 as well as sacral area and left adrenal metastasis diagnosed in June 2025.   Molecular studies show PDL1 of 0%   Biomarker Findings Blood Tumor Mutational Burden - 37 Muts/Mb ctDNA Tumor Fraction - High (2.4%) Microsatellite status - MSI-High Not Detected Genomic Findings For a complete list of the genes assayed, please refer to the Appendix. ARID1A E2250* KEAP1 G524C, G422fs*23 CTNNA1 splice site 2192+1G>C DNMT3A R882C TET2 K1197* TP53 splice site 813_993+[id number], deletion exons 8-9 This assay tested >300 cancer-related genes, including the following 8 gene(s) routinely assessed in this tumor type: ALK, BRAF, EGFR, ERBB2, KRAS, MET, RET, ROS1.    PRIOR THERAPY: 1) Palliative radiation to the painful bone lesion in the left humerus. Last day on 05/03/24.  2) SRS to the brain lesion, completed on on 06/20/24 3) Palliative radiation to the left acetabulum and sacrum 4) SRS to the new brain lesions, completed on 09/13/24  CURRENT THERAPY: Systemic chemotherapy with Carboplatin  for an AUC of 5, Alimta  500 mg/m2, and Libatyo IV every 3 weeks. First dose on 05/02/24. Doses of Alimta  reduced to 400 mg/m2 starting from cycle #2 due to HIV and neutropenia.  Starting from cycle #5 the patient will be on maintenance Alimta  and Libtayo .  Status post 7 cycles. Alimta  discontinued from cycle #6 due to cytopenias***  INTERVAL HISTORY: Benjamin Herrera 58 y.o. male returns to the clinic today for a follow-up visit.  The patient was last seen in the clinic by Dr. Sherrod on 09/05/24.  He was found  to have worsening metastatic disease to the brain recently and is currently undergoing ***.    Patient is expected to see Dr. Emeline at [facility] on 10/11/2024 to discuss pinning his left acetabulum.  He is also scheduled to see ENT for the parotid tail lesion on ***.  He was put on clindamycin .  The patient has a history of HIV and is under the care of Dr. Eben.  His appointment radiation oncology he was endorsing gluteal cleft pain and drainage.  Also has a history of pilonidal cyst.  He saw one of the PAs at [facility] surgery on 09/25/2024 and they discussed ***.   The patient completed 4 cycles of chemotherapy and immunotherapy.  Starting from cycle #5, the patient started maintenance chemotherapy with Alimta  and immunotherapy with Libtayo . He was having worsening anemia and his alimta  was discontinued for cycle #6.   He last was seen in the clinic by Benjamin Herrera on 09/05/2024.  The patient was endorsing back spasms at that time and neck spasms.  Dr. Sherrod arrange for a repeat brain MRI of the chest, abdomen, pelvis. ***Benjamin Herrera wants us  to check to see if he needs different systemic treatment.    The patient denies any fever, chills, or night sweats.  His appetite is up-and-down.  Drinking boost?  He reports his breathing is fine.  He does have to rest after walking a distance.  Denies any unusual cough, hemoptysis, or chest pain.  Nausea and vomiting?  Zofran  and Compazine ?  Denies any diarrhea or constipation.  Denies any headache or visual changes.    He  is followed closely by palliative care.  He denies any abnormal bleeding or bruising.  He has required blood transfusions in the past.  He recently had a restaging CT scan.   He is here today for evaluation and repeat blood work before undergoing cycle #8.  MEDICAL HISTORY: Past Medical History:  Diagnosis Date   Achalasia and cardiospasm    Allergy    Anxiety    Arthritis    back ,shoulders    Asthma    Boils     under arms hx of   CAD (coronary artery disease)    a. 09/2016: cath showing 100% RCA occlusion with collaterals and 90% OM1 (treated with DES)   Candidiasis of mouth    Candidiasis of unspecified site    Cholelithiasis    Cocaine abuse (HCC)    Quit 09/2016   Condyloma acuminatum in male of scrotum & anal canal s/p laser ablation 07/03/2012   Coronary artery disease    Dysphagia    Fracture, humerus    Frozen shoulder    left   GERD (gastroesophageal reflux disease)    Heart attack (HCC)    Hemorrhoids    Hepatitis 1993   A   Herpes labialis    HIV (human immunodeficiency virus infection) (HCC) DX 1993   Hypertension Dx 2014   off all bp meds for last 9 or 10 months   Pilonidal disease 01/10/2012   Polysubstance abuse (HCC)    Psoriasis    hx of   Sleep apnea    does  have CPAP   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  MEDICATIONS:  Current Outpatient Medications  Medication Sig Dispense Refill   acetaminophen  (TYLENOL ) 325 MG tablet Take 650 mg by mouth every 6 (six) hours as needed for mild pain (pain score 1-3), moderate pain (pain score 4-6) or headache.     albuterol  (VENTOLIN  HFA) 108 (90 Base) MCG/ACT inhaler Inhale 2 puffs into the lungs every 6 (six) hours as needed for wheezing or shortness of breath. 1 each 2   amLODipine  (NORVASC ) 5 MG tablet Take 1 tablet (5 mg total) by mouth daily. (Patient not taking: Reported on 09/10/2024) 90 tablet 1   amoxicillin -clavulanate (AUGMENTIN ) 875-125 MG tablet Take 1 tablet by mouth 2 (two) times daily. 20 tablet 0   aspirin  81 MG chewable tablet Chew 1 tablet (81 mg total) by mouth daily. 90 tablet 3   Bictegravir-Emtricitab-Tenofov (BIKTARVY ) 30-120-15 MG TABS Take 1 tablet by mouth daily. 90 tablet 3   Blood Pressure KIT 1 kit by Does not apply route daily. 1 kit  0   diclofenac  Sodium (VOLTAREN  ARTHRITIS PAIN) 1 % GEL Apply 2 g topically 4 (four) times daily. 100 g 3   folic acid  (FOLVITE ) 1 MG tablet Take 1 tablet (1 mg total) by mouth daily. Start 7 days before pemetrexed  chemotherapy. Continue until 21 days after pemetrexed  completed. 30 tablet 3   hydrOXYzine  (ATARAX ) 25 MG tablet Take 1 tablet (25 mg total) by mouth 3 (three) times daily as needed. 30 tablet 0   lidocaine  (XYLOCAINE ) 2 % solution SWISH AND SWALLOW 5 MLS 3 (THREE) TIMES DAILY AS NEEDED FOR MOUTH PAIN. 360 mL 1   lidocaine -prilocaine  (EMLA ) cream Apply to affected area once 30 g 3   LORazepam  (ATIVAN ) 0.5 MG tablet TAKE  1 TAB BY MOUTH 30 MINUTES PRIOR TO RADIATION OR MRI SCANS 4 tablet 0   magic mouthwash (nystatin , diphenhydrAMINE , alum & mag hydroxide) suspension mixture Take 5 mLs by mouth 3 (three) times daily as needed for mouth pain. 240 mL 1   methocarbamol  (ROBAXIN ) 500 MG tablet Take 2 tablets (1,000 mg total) by mouth every 8 (eight) hours as needed. 90 tablet 1   metoprolol  succinate (TOPROL -XL) 25 MG 24 hr tablet TAKE 1/2 TABLET BY MOUTH DAILY 45 tablet 0   nitroGLYCERIN  (NITROSTAT ) 0.4 MG SL tablet Place 1 tablet (0.4 mg total) under the tongue every 5 (five) minutes as needed for chest pain (trouble swallowing). 25 tablet 1   nystatin  (MYCOSTATIN ) 100000 UNIT/ML suspension Take by mouth.     ondansetron  (ZOFRAN ) 8 MG tablet Take 1 tablet (8 mg total) by mouth every 8 (eight) hours as needed for nausea or vomiting. Start on the third day after carboplatin . 30 tablet 1   oxyCODONE -acetaminophen  (PERCOCET/ROXICET) 5-325 MG tablet Take 1 tablet by mouth every 8 (eight) hours as needed for severe pain (pain score 7-10). 45 tablet 0   potassium chloride  SA (KLOR-CON  M) 20 MEQ tablet Take 1 tablet (20 mEq total) by mouth daily. 6 tablet 0   predniSONE  (DELTASONE ) 50 MG tablet Take one tablet 13 hours before , one tablet 7 hours before and one tablet one hour prior to CT scan. Take  benadryl  50 mg one hour prior to scan 3 tablet 0   prochlorperazine  (COMPAZINE ) 10 MG tablet Take 1 tablet (10 mg total) by mouth every 6 (six) hours as needed for nausea or vomiting. 30 tablet 1   sulfamethoxazole -trimethoprim  (BACTRIM  DS) 800-160 MG tablet Take 1 tablet by mouth 3 (three) times a week. 36 tablet 3   No current facility-administered medications for this visit.    SURGICAL HISTORY:  Past Surgical History:  Procedure Laterality Date   BALLOON DILATION N/A 11/11/2015   Procedure: BALLOON DILATION;  Surgeon: Gustav Shila GAILS, MD;  Location: [facility];  Service: Endoscopy;  Laterality: N/A;   BOTOX  INJECTION  10/11/2012   Procedure: BOTOX  INJECTION;  Surgeon: Lamar JONETTA Aho, MD;  Location: [facility];  Service: Endoscopy;  Laterality: N/A;   BOTOX  INJECTION N/A 01/25/2013   Procedure: BOTOX  INJECTION;  Surgeon: Lamar JONETTA Aho, MD;  Location: [facility];  Service: Endoscopy;  Laterality: N/A;   BOTOX  INJECTION N/A 06/21/2013   Procedure: BOTOX  INJECTION;  Surgeon: Lamar JONETTA Aho, MD;  Location: [facility];  Service: Endoscopy;  Laterality: N/A;   BOTOX  INJECTION N/A 10/08/2013   Procedure: BOTOX  INJECTION;  Surgeon: Lamar JONETTA Aho, MD;  Location: [facility];  Service: Endoscopy;  Laterality: N/A;   BOTOX  INJECTION N/A 06/16/2015   Procedure: BOTOX  INJECTION;  Surgeon: Lamar JONETTA Aho, MD;  Location: [facility];  Service: Endoscopy;  Laterality: N/A;   BRONCHIAL BIOPSY  04/03/2024   Procedure: BRONCHOSCOPY, WITH BIOPSY;  Surgeon: Shelah Lamar RAMAN, MD;  [facility];  Service: Pulmonary;;   BRONCHIAL BRUSHINGS  04/03/2024   Procedure: BRONCHOSCOPY, WITH BRUSH BIOPSY;  Surgeon: Shelah Lamar RAMAN, MD;  Location: [facility];  Service: Pulmonary;;   BRONCHIAL NEEDLE ASPIRATION BIOPSY  04/03/2024   Procedure: BRONCHOSCOPY, WITH NEEDLE ASPIRATION BIOPSY;  Surgeon: Shelah Lamar RAMAN, MD;  Location: [facility];  Service: Pulmonary;;   BRONCHOSCOPY, WITH BIOPSY USING  ELECTROMAGNETIC NAVIGATION Right 04/03/2024   Procedure: BRONCHOSCOPY, WITH BIOPSY USING ELECTROMAGNETIC NAVIGATION;  Surgeon: Shelah Lamar RAMAN, MD;  Location: [facility];  Service:  Pulmonary;  Laterality: Right;   CARDIAC CATHETERIZATION N/A 10/11/2016   Procedure: Left Heart Cath and Coronary Angiography;  Surgeon: Lonni JONETTA Cash, MD;  mRCA 100% w/ L>R collaterals, OM1 90%   CARDIAC CATHETERIZATION N/A 10/11/2016   Procedure: Coronary Stent Intervention;  Surgeon: Lonni JONETTA Cash, MD;  RESOLUTE ONYX 3.5X15 DES OM1   COLONOSCOPY  05/19/2012   Procedure: COLONOSCOPY;  Surgeon: Lamar JONETTA Aho, MD;  Location: [facility];  Service: Endoscopy;  Laterality: N/A;  jill trying to contact pt to come in 0830 for 930 case, phone not accepting messages   COLONOSCOPY     ESOPHAGEAL MANOMETRY  05/29/2012   Procedure: ESOPHAGEAL MANOMETRY (EM);  Surgeon: Lamar JONETTA Aho, MD;  Location: [facility];  Service: Endoscopy;  Laterality: N/A;   ESOPHAGEAL MANOMETRY N/A 10/06/2015   Procedure: ESOPHAGEAL MANOMETRY (EM);  Surgeon: Gustav Shila GAILS, MD;  Location: [facility];  Service: Endoscopy;  Laterality: N/A;   ESOPHAGOGASTRODUODENOSCOPY  11/11/2011   Procedure: ESOPHAGOGASTRODUODENOSCOPY (EGD);  Surgeon: Lamar JONETTA Aho, MD;  Location: [facility];  Service: Endoscopy;  Laterality: N/A;  botox  injection  called Pt to change time of procedure per Dr Aho   ESOPHAGOGASTRODUODENOSCOPY  03/10/2012   Procedure: ESOPHAGOGASTRODUODENOSCOPY (EGD);  Surgeon: Lamar JONETTA Aho, MD;  Location: [facility];  Service: Endoscopy;  Laterality: N/A;   ESOPHAGOGASTRODUODENOSCOPY  10/11/2012   Procedure: ESOPHAGOGASTRODUODENOSCOPY (EGD);  Surgeon: Lamar JONETTA Aho, MD;  Location: [facility];  Service: Endoscopy;  Laterality: N/A;   ESOPHAGOGASTRODUODENOSCOPY N/A 01/25/2013   Procedure: ESOPHAGOGASTRODUODENOSCOPY (EGD);  Surgeon: Lamar JONETTA Aho, MD;  Location: [facility];  Service: Endoscopy;  Laterality:  N/A;   ESOPHAGOGASTRODUODENOSCOPY N/A 06/21/2013   Procedure: ESOPHAGOGASTRODUODENOSCOPY (EGD);  Surgeon: Lamar JONETTA Aho, MD;  Location: [facility];  Service: Endoscopy;  Laterality: N/A;   ESOPHAGOGASTRODUODENOSCOPY (EGD) WITH PROPOFOL  N/A 10/08/2013   Procedure: ESOPHAGOGASTRODUODENOSCOPY (EGD) WITH PROPOFOL ;  Surgeon: Lamar JONETTA Aho, MD;  Location: [facility];  Service: Endoscopy;  Laterality: N/A;   ESOPHAGOGASTRODUODENOSCOPY (EGD) WITH PROPOFOL  N/A 06/16/2015   Procedure: ESOPHAGOGASTRODUODENOSCOPY (EGD) WITH PROPOFOL ;  Surgeon: Lamar JONETTA Aho, MD;  Location: [facility];  Service: Endoscopy;  Laterality: N/A;   ESOPHAGOGASTRODUODENOSCOPY (EGD) WITH PROPOFOL  N/A 11/11/2015   Procedure: ESOPHAGOGASTRODUODENOSCOPY (EGD) WITH PROPOFOL ;  Surgeon: Gustav Shila GAILS, MD;  Location: [facility];  Service: Endoscopy;  Laterality: N/A;   EXAMINATION UNDER ANESTHESIA  07/24/2012   Procedure: EXAM UNDER ANESTHESIA;  Surgeon: Elspeth KYM Schultze, MD;  Location: [facility];  Service: General;  Laterality: N/A;   humeral fracture surgery Right yrs ago   IR RADIOLOGIST EVAL & MGMT  09/12/2024   PILONIDAL CYST EXCISION  07/24/2012   Procedure: CYST EXCISION PILONIDAL SIMPLE;  Surgeon: Elspeth KYM Schultze, MD;  Location: [facility];  Service: General;  Laterality: N/A;  Exam Under Anesthesia,, Excision Pilonidal Disease,    right shoulder replacement  2008   x 2   UPPER GASTROINTESTINAL ENDOSCOPY     WART FULGURATION  07/24/2012   Procedure: FULGURATION ANAL WART;  Surgeon: Elspeth KYM Schultze, MD;  Location: [facility];  Service: General;  Laterality: N/A;  excision of raphe mass    REVIEW OF SYSTEMS:   Review of Systems  Constitutional: Negative for appetite change, chills, fatigue, fever and unexpected weight change.  HENT:   Negative for mouth sores, nosebleeds, sore throat and trouble swallowing.   Eyes: Negative for eye problems and icterus.  Respiratory: Negative for cough, hemoptysis, shortness of breath and  wheezing.   Cardiovascular: Negative for chest pain and  leg swelling.  Gastrointestinal: Negative for abdominal pain, constipation, diarrhea, nausea and vomiting.  Genitourinary: Negative for bladder incontinence, difficulty urinating, dysuria, frequency and hematuria.   Musculoskeletal: Negative for back pain, gait problem, neck pain and neck stiffness.  Skin: Negative for itching and rash.  Neurological: Negative for dizziness, extremity weakness, gait problem, headaches, light-headedness and seizures.  Hematological: Negative for adenopathy. Does not bruise/bleed easily.  Psychiatric/Behavioral: Negative for confusion, depression and sleep disturbance. The patient is not nervous/anxious.     PHYSICAL EXAMINATION:  There were no vitals taken for this visit.  ECOG PERFORMANCE STATUS: {CHL ONC ECOG [id number]  Physical Exam  Constitutional: Oriented to person, place, and time and well-developed, well-nourished, and in no distress. No distress.  HENT:  Head: Normocephalic and atraumatic.  Mouth/Throat: Oropharynx is clear and moist. No oropharyngeal exudate.  Eyes: Conjunctivae are normal. Right eye exhibits no discharge. Left eye exhibits no discharge. No scleral icterus.  Neck: Normal range of motion. Neck supple.  Cardiovascular: Normal rate, regular rhythm, normal heart sounds and intact distal pulses.   Pulmonary/Chest: Effort normal and breath sounds normal. No respiratory distress. No wheezes. No rales.  Abdominal: Soft. Bowel sounds are normal. Exhibits no distension and no mass. There is no tenderness.  Musculoskeletal: Normal range of motion. Exhibits no edema.  Lymphadenopathy:    No cervical adenopathy.  Neurological: Alert and oriented to person, place, and time. Exhibits normal muscle tone. Gait normal. Coordination normal.  Skin: Skin is warm and dry. No rash noted. Not diaphoretic. No erythema. No pallor.  Psychiatric: Mood, memory and judgment normal.  Vitals  reviewed.  LABORATORY DATA: Lab Results  Component Value Date   WBC 5.0 09/05/2024   HGB 8.4 (L) 09/05/2024   HCT 25.8 (L) 09/05/2024   MCV 84.3 09/05/2024   PLT 147 (L) 09/05/2024      Chemistry      Component Value Date/Time   NA 139 09/05/2024 0904   NA 136 06/19/2024 1445   K 3.5 09/05/2024 0904   CL 105 09/05/2024 0904   CO2 26 09/05/2024 0904   BUN 17 09/05/2024 0904   BUN 20 06/19/2024 1445   CREATININE 1.19 09/05/2024 0904   CREATININE 0.96 07/30/2020 1437      Component Value Date/Time   CALCIUM  9.1 09/05/2024 0904   ALKPHOS 58 09/05/2024 0904   AST 18 09/05/2024 0904   ALT 6 09/05/2024 0904   BILITOT 0.4 09/05/2024 0904       RADIOGRAPHIC STUDIES:  CT CHEST ABDOMEN PELVIS W CONTRAST Result Date: 09/24/2024 CLINICAL DATA:  Non-small cell lung cancer. Ongoing left pelvic pain. Shortness of breath and weight loss. Constipation. * Tracking Code: BO * EXAM: CT CHEST, ABDOMEN, AND PELVIS WITH CONTRAST TECHNIQUE: Multidetector CT imaging of the chest, abdomen and pelvis was performed following the standard protocol during bolus administration of intravenous contrast. RADIATION DOSE REDUCTION: This exam was performed according to the departmental dose-optimization program which includes automated exposure control, adjustment of the mA and/or kV according to patient size and/or use of iterative reconstruction technique. CONTRAST:  OMNIPAQUE  IOHEXOL  300 MG/ML  SOLN COMPARISON:  07/09/2024 and PET 04/09/2024. FINDINGS: CT CHEST FINDINGS Cardiovascular: Atherosclerotic calcification of the aorta with age advanced involvement of the left anterior descending and circumflex coronary arteries. Heart size normal. No pericardial effusion. Mediastinum/Nodes: Right low internal jugular and supraclavicular lymph nodes appear to have increased in size and number. 9 mm index lymph node (2/6), previously 7 mm. 7 mm right supraclavicular lymph node (  2/6) appears new. No pathologically  enlarged mediastinal or axillary lymph nodes. Hilar regions are difficult to definitively evaluate without IV contrast. Air and fluid are seen in a dilated esophagus. Mid and lower esophageal wall is minimally thickened, as before. Lungs/Pleura: Spiculated mass in the medial aspect of the apical segment right upper lobe measures approximately 2.4 x 3.4 cm (4/27), with invasion of the superior mediastinum, grossly stable in size when remeasured in similar fashion. Centrilobular and paraseptal emphysema. Question postoperative scarring in the posterior segment right upper lobe. New peribronchovascular consolidation and ground-glass in the medial segment right middle lobe. Small right pleural effusion. 4 mm anterior segment left upper lobe nodule (4/69), as before. No pleural fluid. Airway is unremarkable. Musculoskeletal: Right shoulder arthroplasty. Slight compression of the T8 superior endplate, old. Similar healing or healed pathologic left proximal humerus fracture. Additional osseous metastatic disease better visualized on PET 04/09/2024. CT ABDOMEN PELVIS FINDINGS Hepatobiliary: Liver is unremarkable. Gallstone. No biliary ductal dilatation. Pancreas: Negative. Spleen: Negative. Adrenals/Urinary Tract: Right adrenal gland is unremarkable. New lateral limb left adrenal nodule measures 1.7 x 2.3 cm. Renal cortical thinning and slight scarring bilaterally. Tiny low-attenuation lesion in the right kidney, too small to characterize. No specific follow-up necessary. Possible vague low density in the anterior interpolar left kidney (7/22), not well seen on 07/09/2024. Ureters are decompressed. Bladder is relatively low in volume. Right posterolateral bladder diverticulum. Stomach/Bowel: Stomach, small bowel, appendix and colon are unremarkable. Vascular/Lymphatic: Atherosclerotic calcification of the aorta. No pathologically enlarged lymph nodes. Inguinal lymph nodes are not enlarged by CT size criteria. Reproductive:  Prostate is normal in size. Other: No free fluid.  Mesenteries and peritoneum are unremarkable. Musculoskeletal: Lucency and compression involving the L2 superior endplate, unchanged. Additional osseous metastatic disease, better seen on PET 04/09/2024. IMPRESSION: 1. New/enlarging low right internal jugular/right supraclavicular lymph nodes with a new left adrenal nodule, findings compatible with disease progression. 2. Spiculated right upper lobe mass is grossly stable in size. 3. Old pathologic proximal left humerus and L2 superior endplate fractures. Additional osseous metastatic disease, better visualized on PET 04/09/2024. 4. New peribronchovascular ground-glass and consolidation in the medial segment right upper lobe, likely infectious/inflammatory in etiology. 5. Esophageal dysmotility with similar mid and lower esophageal wall thickening, possibly due to radiation therapy. 6. Possible vague low-density in the anterior interpolar left kidney, not previously seen. Recommend attention on follow-up. 7.  Age advanced two vessel coronary artery calcification. 8.  Aortic atherosclerosis (ICD10-I70.0). Electronically Signed   By: Newell Eke M.D.   On: 09/24/2024 13:46   IR Radiologist Eval & Mgmt Result Date: 09/12/2024 EXAM: NEW PATIENT OFFICE VISIT CHIEF COMPLAINT: See below HISTORY OF PRESENT ILLNESS: See below REVIEW OF SYSTEMS: See below PHYSICAL EXAMINATION: See below ASSESSMENT AND PLAN: Please refer to completed note in the electronic medical record on [place] [facility] Thom Hall, MD [facility] Electronically Signed   By: Thom Hall M.D.   On: 09/12/2024 14:22   MR Pelvis W Wo Contrast Result Date: 09/11/2024 EXAM: MRI OF THE PELVIS WITH AND WITHOUT INTRAVENOUS CONTRAST 09/07/2024 06:19:52 PM TECHNIQUE: Multiplanar magnetic resonance images of the pelvis with and without intravenous contrast. 8 mL of GADAVIST  was administered.  COMPARISON: CT pelvis 07/09/2024. CLINICAL HISTORY: Metastatic disease evaluation and sacral met on prior MRI. FINDINGS: SOFT TISSUES: The soft tissues are unremarkable. JOINTS: The sacroiliac joints appear unremarkable. No dislocation or significant effusion. LIMITED INTRAPELVIC CONTENTS: No acute abnormality. BONES: 0.9 cm lesion with high  T2 and low T1 signal characteristics in the right iliac bone on image 14 series 4 compatible with metastatic disease. 3.7 x 2.6 cm left sacral metastatic lesion with high T2 and low T1 signal characteristics. This was substantially smaller on the PET CT from 04/09/2024, compatible with progression. 2.0 x 1.8 cm metastatic lesion of the left upper acetabulum on image 27 series 4 with high T2 and low T1 signal characteristics. These metastatic lesions demonstrate only low grade enhancement compared to the surrounding bony marrow. No sacral foraminal impingement currently identified. No impingement on the sacral plexus or proximal sciatic nerves. IMPRESSION: 1. Progression of the left sacral metastatic lesion, now measuring 3.7 x 2.6 cm, compared to PET CT from 04/09/24. 2. Additional metastatic lesions in the right iliac bone (0.9 cm) and left upper acetabulum (2.0 x 1.8 cm). 3. No sacral foraminal impingement or impingement on the sacral plexus or proximal sciatic nerves. Electronically signed by: Ryan Salvage MD 09/11/2024 01:15 PM EST RP Workstation: [id number]   MR Brain W Wo Contrast Addendum Date: 09/03/2024 ******** ADDENDUM #1 ******** ADDENDUM: This was discussed in multidisciplinary tumor board conference on 09/03/2024. The punctate enhancing focus in the right precentral gyrus has been treated with SRS in the interval since the 05/29/2024 study, and therefore the stability/decreased conspicuity of this focus may reflect a successfully treated metastasis. The punctate enhancing focus more superiorly at the right frontal vertex has not been treated and again could  be vascular with continued surveillance warranted. ---------------------------------------------------- Electronically signed by: Dasie Hamburg MD 09/03/2024 08:56 AM EST RP Workstation: [id number]   Result Date: 09/03/2024 ******** ORIGINAL REPORT ******** EXAM: MRI BRAIN WITH AND WITHOUT CONTRAST 08/31/2024 06:28:49 PM TECHNIQUE: Multiplanar multisequence MRI of the head/brain was performed with and without the administration of intravenous contrast. CONTRAST: 10 mL Gadavist . COMPARISON: MR Head 05/29/2024 and 04/06/2024. CLINICAL HISTORY: Metastatic disease evaluation. Non small cell lung cancer. FINDINGS: LIMITATIONS: The examination is motion degraded, including up to moderate motion artifact on postcontrast imaging. BRAIN AND VENTRICLES: There is no evidence of an acute infarct, midline shift, or extra axial fluid collection. 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 There is increased mass effect on the midbrain tectum and cerebral aqueduct without associated obstructive hydrocephalus. The punctate foci of enhancement in the right superior frontal gyrus near the vertex (series 751 image 272) and right precentral gyrus (series 751 image 239) are less conspicuous on the current examination (at least in part due to motion artifact today) and were also present on the 04/06/2024 study without interval progression. The 3 mm focus of enhancement along the anterior parasagittal left frontal lobe on the prior study is also less conspicuous and felt to be vascular. No new enhancing brain lesions are identified. A punctate focus of susceptibility in the cerebellar vermis is unchanged and may reflect chronic microhemorrhage or calcification. There is also a small amount of calcification or chronic blood products associated with the pineal mass. Normal flow voids. ORBITS: No acute abnormality. SINUSES: Chronic large  nasal septal defect. Mild mucosal thickening in the paranasal sinuses. Clear mastoid air cells. BONES AND SOFT TISSUES: Normal bone marrow signal and enhancement. Unchanged 2.7 cm cystic right parotid tail mass and subcentimeter left parotid mass. IMPRESSION: 1. 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 2. The punctate foci of enhancement in the high right  frontal lobe and along the anterior parasagittal left frontal lobe are all less conspicuous on today's motion-degraded examination and favored to be vascular. Electronically signed by: Dasie Hamburg MD 09/03/2024 06:58 AM EST RP Workstation: [id number]   MR Lumbar Spine W Wo Contrast Result Date: 09/03/2024 EXAM: MRI LUMBAR SPINE 08/31/2024 06:29:34 PM TECHNIQUE: Multiplanar multisequence MRI of the lumbar spine was performed with and without the administration of intravenous contrast. CONTRAST: 10 mL Gadavist . COMPARISON: MR Lumbar Spine 04/15/2017. CT chest, abdomen, and pelvis 07/09/2024. PET-CT 04/09/2024. CLINICAL HISTORY: Metastatic disease evaluation, back pain. Lung cancer. FINDINGS: Multiple sequences are moderately to severely motion degraded despite repeat imaging. BONES AND ALIGNMENT: 5 lumbar type vertebrae. Normal alignment. Large, heterogeneously enhancing lesion involving nearly the entirety of the L2 vertebral body, eccentric to the left, with extension into the left pedicle and with a left sided superior endplate compression fracture demonstrating 20% vertebral body height loss. No retropulsion or epidural tumor. Partially visualized 3.7 cm enhancing lesion in the left lateral sacrum at the S3 level. SPINAL CORD: The conus medullaris terminates at L1 and is normal in signal. SOFT TISSUES: No paraspinal mass. DISC LEVELS: Mild lumbar spondylosis with mild multilevel disc bulging and mild facet hypertrophy. No significant spinal stenosis. Mild bilateral neural  foraminal stenosis at L4-L5. IMPRESSION: 1. 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 Partially visualized left sacral metastasis. These have progressed from the 04/09/2024 PET-CT. 2. Mild lumbar spondylosis without compressive stenosis. Electronically signed by: Dasie Hamburg MD 09/03/2024 06:59 AM EST RP Workstation: [id number]     ASSESSMENT/PLAN:  This is a very pleasant 58 years old African-American male recently diagnosed with a stage IV (T3, N3, M1 C) non-small cell lung cancer, adenocarcinoma presented with large right upper lobe lung mass in addition to right hilar and mediastinal as well as right supraclavicular lymphadenopathy and metastatic bone lesions including the left proximal humerus, L2 as well as sacral area and left adrenal metastasis diagnosed in June 2025.   His molecular studies show no actionable mutations. He has high TMB. 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   The patient is currently on chemotherapy at a reduced dose with carboplatin  for an AUC of 4 and Alimta  400 mg/m, and Libtayo  IV every 3 weeks.  He is status post 7 cycles of treatment.  Starting from cycle #5 the patient started maintenance treatment with Alimta  400 mg/m and Libtayo  IV every 3 weeks. ***Immunotherapy alone vs with Alimta .   The patient was seen with Dr. Sherrod today.  Dr. Sherrod personally and independently reviewed the scan and discussed results with the patient today.  The scan showed ***.  Dr. Sherrod recommends ***  **Systemic treatment    Labs were reviewed.  His total WBC is *** and his ANC is ***.    He will *** with cycle number 8 today as scheduled with single agent libtayo .   Still monitor his lab work weekly to ensure he does not have worsening anemia which may require blood transfusion.  Will consider him for blood transfusion if his hemoglobin were less than 8.  I have standing orders for sample of blood bank.***    ***Hopefully he will have less nausea since he is not receiving Alimta  today.  He has Zofran  and Compazine  at home which he can alternate.   We will see him back for a follow-up visit in 3 weeks.    Will continue to follow with palliative care. ***  He will continue to follow with the surgical oncologist for his acetabulum. He has an appointment on 10/11/24.  He recently underwent SRS to the processive metastatic brain lesions on 09/07/24.   He also received palliative radiation to the sacrum and left acetabulum.    ***the wound  ***palliative care   The patient was advised to call immediately if she has any concerning symptoms in the interval. The patient voices understanding of current disease status and treatment options and is in agreement with the current care plan. All questions were answered. The patient knows to call the clinic with any problems, questions or concerns. We can certainly see the patient much sooner if necessary    No orders of the defined types were placed in this encounter.    I spent {CHL ONC TIME VISIT - DTPQU:[id number]} counseling the patient face to face. The total time spent in the appointment was {CHL ONC TIME VISIT - DTPQU:[id number]}.  Alyssamarie Mounsey L Nguyen Todorov, PA-C 09/24/24

## 2024-09-25 ENCOUNTER — Ambulatory Visit (INDEPENDENT_AMBULATORY_CARE_PROVIDER_SITE_OTHER): Admitting: Otolaryngology

## 2024-09-25 ENCOUNTER — Ambulatory Visit
Admission: RE | Admit: 2024-09-25 | Discharge: 2024-09-25 | Disposition: A | Source: Ambulatory Visit | Attending: Radiation Oncology

## 2024-09-25 ENCOUNTER — Other Ambulatory Visit

## 2024-09-25 ENCOUNTER — Other Ambulatory Visit: Payer: Self-pay

## 2024-09-25 DIAGNOSIS — M533 Sacrococcygeal disorders, not elsewhere classified: Secondary | ICD-10-CM | POA: Diagnosis not present

## 2024-09-25 DIAGNOSIS — Z21 Asymptomatic human immunodeficiency virus [HIV] infection status: Secondary | ICD-10-CM | POA: Diagnosis not present

## 2024-09-25 DIAGNOSIS — R6889 Other general symptoms and signs: Secondary | ICD-10-CM | POA: Diagnosis not present

## 2024-09-25 DIAGNOSIS — C3491 Malignant neoplasm of unspecified part of right bronchus or lung: Secondary | ICD-10-CM | POA: Diagnosis not present

## 2024-09-25 DIAGNOSIS — Z5112 Encounter for antineoplastic immunotherapy: Secondary | ICD-10-CM | POA: Diagnosis not present

## 2024-09-25 DIAGNOSIS — Z79899 Other long term (current) drug therapy: Secondary | ICD-10-CM | POA: Diagnosis not present

## 2024-09-25 LAB — RAD ONC ARIA SESSION SUMMARY
Course Elapsed Days: 22
Plan Fractions Treated to Date: 2
Plan Prescribed Dose Per Fraction: 3 Gy
Plan Total Fractions Prescribed: 10
Plan Total Prescribed Dose: 30 Gy
Reference Point Dosage Given to Date: 6 Gy
Reference Point Session Dosage Given: 3 Gy
Session Number: 13

## 2024-09-25 NOTE — Progress Notes (Signed)
 REFERRING PROVIDER:  Self  PROVIDER:  PUJA GOSAI MACZIS, PA  MRN: [id number] DOB: 04-13-1966 DATE OF ENCOUNTER: 09/26/2024  Subjective   Chief Complaint: Follow-up    History of Present Illness: Benjamin Herrera is a 58 y.o. 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  He presented with large right upper lobe lung mass in addition to right hilar and mediastinal as well as right supraclavicular lymphadenopathy and metastatic bone lesions including the left proximal humerus, L2 as well as sacral area and left adrenal metastasis diagnosed in June 2025. He is receiving radiation to left acetabulum and left sacrum mets.    In 2013, Dr. Sheldon performed an exam under anesthesia, excision pilonidal disease, and excision of anterior midline raphae mass.  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  In 2016, he underwent excision of perianal & intergluteal cystic masses.  Tissue showed evidence of dysplasia high-grade but no anal carcinoma. Condylomatous features.   He followed up with Dr. 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  At that time no hidradenitis, condyloma, or other abnormalities were noted.  He was complaining of severe coccygeal pilonidal pain but CT scan was negative.    He saw Isaiah Husband, PA-C with radiation oncology on 09/24/2024, complaining of pain in the gluteal cleft region x 2 week.  He also complained of drainage but denied fever.  He states he takes oxycodone  for pain but it is not helping much.  Of note, patient underwent aspiration of parotid cyst/lesion on 09/10/2024 and was prescribed clindamycin  x 10 days.  Most recent CT chest/abdomen/pelvis on 09/18/2024.   Pathology:   08/04/2015 Diagnosis 1. 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  - NO INVASIVE  CARCINOMA. 2. 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 - NO INVASIVE CARCINOMA  07/24/2012 Diagnosis 1. Skin , midline midline raphe - SQUAMOUS SQUAMOUS CELL CARCINOMA CARCINOMA IN SITU. - BILATERAL BILATERAL MARGINS MARGINS NOT INVOLVED. INVOLVED. 2. Skin , presacral presacral - MICROSCOPIC MICROSCOPIC FOCUS OF INVASIVE INVASIVE SQUAMOUS SQUAMOUS CELL CARCINOMA CARCINOMA ARISING ARISING IN A  BACKGROUND BACKGROUND OF EXTENSIVE EXTENSIVE SQUAMOUS SQUAMOUS CELL CARCINOMA CARCINOMA IN SITU. - SQUAMOUS SQUAMOUS CELL CARCINOMA CARCINOMA IN SITU EXTENDING EXTENDING TO THE PILONIDAL PILONIDAL TRACT. - BILATERAL BILATERAL MARGINS, MARGINS, NEGATIVE NEGATIVE FOR ATYPIA, ATYPIA, DYSPLASIA DYSPLASIA OR MALIGNANCY  Imaging:   CT chest/abd/pelvis 09/24/2024 IMPRESSION: 1. New/enlarging low right internal jugular/right supraclavicular lymph nodes with a new left adrenal nodule, findings compatible with disease progression. 2. Spiculated right upper lobe mass is grossly stable in size. 3. Old pathologic proximal left humerus and L2 superior endplate fractures. Additional osseous metastatic disease, better visualized on PET 04/09/2024. 4. New peribronchovascular ground-glass and consolidation in the medial segment right upper lobe, likely infectious/inflammatory in etiology. 5. Esophageal dysmotility with similar mid and lower esophageal wall thickening, possibly due to radiation therapy. 6. Possible vague low-density in the anterior interpolar left kidney, not previously seen. Recommend attention on follow-up. 7.  Age advanced two vessel coronary artery calcification. 8.  Aortic atherosclerosis (ICD10-I70.0).   MRI pelvis 09/11/2024 IMPRESSION: 1. Progression of the left sacral metastatic lesion, now measuring 3.7 x 2.6 cm, compared to PET CT from 04/09/24. 2. Additional metastatic  lesions in the  right iliac bone (0.9 cm) and left upper acetabulum (2.0 x 1.8 cm). 3. No sacral foraminal impingement or impingement on the sacral plexus or proximal sciatic nerves.  Review of Systems: A complete review of systems was obtained from the patient.  I have reviewed this information and discussed as appropriate with the patient.  See HPI as well for other ROS.  All other review of systems negative.   Medical History: Past Medical History:  Diagnosis Date  . Arthritis   . Asthma, unspecified asthma severity, unspecified whether complicated, unspecified whether persistent (HHS-HCC)   . Connective tissue disorder (HHS-HCC)   . GERD (gastroesophageal reflux disease)   . Sleep apnea     There is no problem list on file for this patient.   Past Surgical History:  Procedure Laterality Date  . Cardiac catheterization     . Esophagogastroduodenoscopy    . humeral fracture surgery [Other] (Right    . PILONIDAL CYST / SINUS EXCISION    . Upper gastrointestinal endoscopy    . Wart fulguration       Allergies  Allergen Reactions  . Citrus And Derivatives Swelling    Mouth itching and swelling  Mouth itching and swelling    . Other Swelling    Mouth itching and swelling  Mouth itching and swelling     Current Outpatient Medications on File Prior to Visit  Medication Sig Dispense Refill  . acetaminophen  (TYLENOL ) 325 MG tablet Take 650 mg by mouth    . albuterol  90 mcg/actuation inhaler Inhale into the lungs    . amLODIPine  (NORVASC ) 2.5 MG tablet Take by mouth    . bictegravir-emtricitabine -tenofovir  alafenamide (BIKTARVY ) 30-120-15 mg tablet Take 1 tablet by mouth once daily    . diclofenac  (VOLTAREN ) 1 % topical gel Apply 2 g topically 4 (four) times daily    . folic acid  (FOLVITE ) 1 MG tablet Take 1 mg by mouth    . hydrocortisone  2.5 % ointment Apply topically    . lidocaine  2 % solution SWISH AND SWALLOW 5 MLS 3 (THREE) TIMES DAILY AS NEEDED FOR MOUTH PAIN.     . lidocaine  5 % ointment Apply 1 Application topically as needed    . lidocaine -prilocaine  (EMLA ) cream Apply to affected area once    . LORazepam  (ATIVAN ) 0.5 MG tablet TAKE 1 TAB BY MOUTH 30 MINUTES PRIOR TO RADIATION OR MRI SCANS    . methocarbamoL  (ROBAXIN ) 500 MG tablet Take 1,000 mg by mouth    . metoprolol  succinate (TOPROL -XL) 25 MG XL tablet Take by mouth    . naphazoline-pheniramine 0.02675-0.315 % Drop Apply to eye    . nitroGLYcerin  (NITROSTAT ) 0.4 MG SL tablet Place 0.4 mg under the tongue every 5 (five) minutes as needed    . nystatin  (MYCOSTATIN ) 100,000 unit/mL suspension Take by mouth    . ondansetron  (ZOFRAN ) 8 MG tablet Take 8 mg by mouth    . ondansetron  (ZOFRAN -ODT) 8 MG disintegrating tablet Take 8 mg by mouth every 8 (eight) hours as needed    . oxyCODONE -acetaminophen  (PERCOCET) 5-325 mg tablet Take 1 tablet by mouth    . pantoprazole  (PROTONIX ) 40 MG DR tablet Take by mouth    . potassium chloride  (KLOR-CON  M20) 20 MEQ ER tablet Take 20 mEq by mouth once daily    . rosuvastatin  (CRESTOR ) 40 MG tablet Take 40 mg by mouth once daily    . zolpidem  (AMBIEN ) 10 mg tablet Take 10 mg by mouth at bedtime as needed    .  ezetimibe  (ZETIA ) 10 mg tablet Take 1 tablet by mouth once daily     No current facility-administered medications on file prior to visit.    Family History  Problem Relation Age of Onset  . Heart valve disease Mother      Social History   Tobacco Use  Smoking Status Every Day  . Types: Cigarettes  Smokeless Tobacco Never     Social History   Socioeconomic History  . Marital status: Single  Tobacco Use  . Smoking status: Every Day    Types: Cigarettes  . Smokeless tobacco: Never  Vaping Use  . Vaping status: Never Used  Substance and Sexual Activity  . Alcohol use: Never  . Drug use: Never   Social Drivers of Corporate Investment Banker Strain: Low Risk  (11/08/2023)   Received from [facility]   Overall Financial Resource Strain  (CARDIA)   . Difficulty of Paying Living Expenses: Not very hard  Food Insecurity: Food Insecurity Present (05/23/2024)   Received from [facility]   Hunger Vital Sign   . Within the past 12 months, you worried that your food would run out before you got the money to buy more.: Sometimes true   . Within the past 12 months, the food you bought just didn't last and you didn't have money to get more.: Sometimes true  Transportation Needs: No Transportation Needs (04/16/2024)   Received from [facility] - Transportation   . In the past 12 months, has lack of transportation kept you from medical appointments or from getting medications?: No   . In the past 12 months, has lack of transportation kept you from meetings, work, or from getting things needed for daily living?: No  Physical Activity: Inactive (11/08/2023)   Received from [facility]   Exercise Vital Sign   . On average, how many days per week do you engage in moderate to strenuous exercise (like a brisk walk)?: 0 days   . On average, how many minutes do you engage in exercise at this level?: 0 min  Stress: No Stress Concern Present (11/08/2023)   Received from [facility] - Occupational Stress Questionnaire   . Feeling of Stress : Not at all  Social Connections: Socially Isolated (11/08/2023)   Received from [facility]   Social Connection and Isolation Panel   . In a typical week, how many times do you talk on the phone with family, friends, or neighbors?: Three times a week   . How often do you get together with friends or relatives?: Three times a week   . How often do you attend church or religious services?: Never   . Do you belong to any clubs or organizations such as church groups, unions, fraternal or athletic groups, or school groups?: No   . How often do you attend meetings of the clubs or organizations you belong to?: Never   . Are you married, widowed, divorced, separated,  never married, or living with a partner?: Never married  Housing Stability: Unknown (11/08/2023)   Received from [facility] Stability Vital Sign   . Unable to Pay for Housing in the Last Year: No   . Homeless in the Last Year: No    Objective:   BP 96/76   Pulse 101   Temp 36.5 C (97.7 F)   Ht 186.7 cm (6' 1.5)   Wt 78.7 kg (173 lb 6.4 oz)  SpO2 99%   BMI 22.57 kg/m  Body mass index is 22.57 kg/m.  General: Well-developed, well-nourished, in no acute distress Eyes: No scleral icterus.  Pupils equal, lids normal Respiratory: Normal effort, no use of accessory muscles Musculoskeletal: Normal gait. Grossly normal ROM upper and lower extremities  Psychiatric: Normal judgement and insight.  Normal mood and affect.  Alert, oriented x 3  Gluteal cleft: There are 2 open wounds in the gluteal cleft region spanning about 3 cm total.  The superior opening has a 2 mm area of firm white tissue.  TTP.  No active drainage or surrounding erythema  In the right anterior perianal region, there is a small < 1 cm area of granulation tissue without surrounding erythema or drainage.  No TTP        A chaperone, Jordyn Sharpe, CMA, was present during the exam.   Assessment and Plan:   Diagnoses and all orders for this visit:  Sacral mass    Benjamin Herrera is presenting to the office with a gluteal cleft lesion that has a small amount of firm, fixed tissue in the superior opening.  There is a soft lesion on the right anterior perianal region today.  There are no signs of infection today.  With his history, I recommended follow up with Dr. Sheldon for evaluation of possible biopsy, if warranted.    Return for appt with surgeon as planned.  This patient encounter took moderate decision making and 30 minutes today to perform the following: review records, take history, perform exam, counsel the patient on the diagnosis, and document encounter, findings, and plan in the EHR.  Puja  Maczis, [facility]

## 2024-09-26 ENCOUNTER — Other Ambulatory Visit: Payer: Self-pay | Admitting: Physician Assistant

## 2024-09-26 ENCOUNTER — Ambulatory Visit

## 2024-09-26 DIAGNOSIS — R6889 Other general symptoms and signs: Secondary | ICD-10-CM | POA: Diagnosis not present

## 2024-09-26 DIAGNOSIS — C3491 Malignant neoplasm of unspecified part of right bronchus or lung: Secondary | ICD-10-CM

## 2024-09-27 ENCOUNTER — Inpatient Hospital Stay: Admitting: Physician Assistant

## 2024-09-27 ENCOUNTER — Other Ambulatory Visit: Payer: Self-pay | Admitting: Physician Assistant

## 2024-09-27 ENCOUNTER — Inpatient Hospital Stay: Attending: Physician Assistant

## 2024-09-27 ENCOUNTER — Ambulatory Visit

## 2024-09-27 ENCOUNTER — Encounter: Payer: Self-pay | Admitting: Internal Medicine

## 2024-09-27 ENCOUNTER — Inpatient Hospital Stay: Admitting: Nurse Practitioner

## 2024-09-27 ENCOUNTER — Inpatient Hospital Stay

## 2024-09-27 ENCOUNTER — Telehealth: Payer: Self-pay | Admitting: *Deleted

## 2024-09-27 DIAGNOSIS — C3491 Malignant neoplasm of unspecified part of right bronchus or lung: Secondary | ICD-10-CM | POA: Diagnosis not present

## 2024-09-27 DIAGNOSIS — Z21 Asymptomatic human immunodeficiency virus [HIV] infection status: Secondary | ICD-10-CM | POA: Diagnosis not present

## 2024-09-27 DIAGNOSIS — L98422 Non-pressure chronic ulcer of back with fat layer exposed: Secondary | ICD-10-CM | POA: Diagnosis not present

## 2024-09-27 DIAGNOSIS — N5089 Other specified disorders of the male genital organs: Secondary | ICD-10-CM | POA: Diagnosis not present

## 2024-09-27 DIAGNOSIS — Z79899 Other long term (current) drug therapy: Secondary | ICD-10-CM | POA: Diagnosis not present

## 2024-09-27 DIAGNOSIS — R6889 Other general symptoms and signs: Secondary | ICD-10-CM | POA: Diagnosis not present

## 2024-09-27 NOTE — Progress Notes (Signed)
 PROVIDER:  ELSPETH JUDAH SCHULTZE, MD  Patient Care Team: Benjamin Herrera, Enobong, MD as PCP - General (Family Medicine) Eben Reyes Dunnings, MD (Infectious Diseases) Benjamin Herrera, Benjamin V, MD (Gastroenterology)  [facility] MRN: [id number] DOB: 1966/08/26 DATE OF ENCOUNTER: 09/27/2024    Interval History:   The patient returns to the office under request of radiation oncology, Benjamin Husband, PA  Patient with numerous health issues.  I had seen the past for AIN.  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  Lost to follow-up but I saw 2023 with no evidence of any local recurrence.  Unfortunately has stage IV non-small lung cancer.  Followed by Dr. Gatha.  Has evidence of metastatic disease chest neck mediastinum.  Osseous lesions in right clavicle, left humerus left L2 lumbar vertebral body, and upper inner left sacrum.  Concern of a lesion in the parotid in the head neck.  Underwent aspiration.  Cultures actually came back showing cutibacterium acnes and possible rare fungus mold.    Getting palliative pelvic radiation finished in late November.  A week later he had complaints of some skin separation and pain..  They noticed a skin ulcer in the intergluteal cleft.  Was concerned and sent to the office urgently.  Seen by our PA 2 days ago.  Patient was putting antibiotic ointment on it.  Feels very sensitive and painful.  Still has some swelling on his neck from where that cyst was drained.  He is on an antibiotic but he cannot recall exactly what.  No major drainage but wearing diapers just in case.  He is back to being constipated, moving his bowels maybe twice a week.  He tried some stool softeners but does not know if it is really helping much.  PRIOR NOTE 03/01/2022 HIV positive male on chronic retroviral therapy followed by infectious disease Dr. Eben.  History of condyloma.  I have done surgeries on 2013-2016 to excise condyloma.  Last excision in 2016 showed some high-grade dysplasia and  margins were positive.  I last saw him 2017 with no evidence of any local recurrence.  Recommended 58-month follow-up.  It has been 5 1/2 years  He followed up with infectious disease.  Saw Dr. Eben.  He was concerned that the patient could have had recurrent condyloma.  Therefore consult requested.  Patient has been prescriptions for oxycodone  and tramadol  more recently.  I think he used to be on Mobic  but not anymore.  He still smokes but is cut down to just about 5 cigarettes a day.  He notes he moves his bowels every day.  No pain with bowel movements.  No bleeding or drainage.  No fevers or chills.  He has been able to stay on his retroviral therapy.  I do not think he has had a colonoscopy since his normal one in 2013 by Dr. Debrah with [facility] gastroenterology.  Patient's had a known esophageal stricture.  Prior manometry does not definitely prove achalasia although that diagnosis has been thrown around.  Some dysphagia with that.        Labs, Imaging and Diagnostic Testing:  Located in 'Care Everywhere' section of Epic EMR chart   PRIOR [facility] CLINIC NOTES:  Located in 'Care Everywhere' section of Epic EMR chart    Benjamin Herrera 12/01/2015 3:56 PM Location: [facility] Patient #: [id number] DOB: [birth year] Single / Language: Isadora / Race: Black or African American Male   History of Present Illness Carolan KYM Schultze MD; 12/01/2015 5:05 PM) Patient words:  reck.  The patient is a 58 year old male presenting for a post-operative visit. Patient returns status post surgery 08/04/2015.  Excision of perianal & intergluteal cystic masses.  Tissue showed evidence of dysplasia high-grade but no anal carcinoma.  Condylomatous features  The patient is a 58 year old male who presents with anal lesions. Patient returns over concern of new mass on his tailbone.  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  Last time 2014.    I saw him for about a year and then he was lost to follow-up.  Patient has backed off but not completely quit smoking.  Patient claims his HIV numbers are good.  He felt a nodule a week or 2 ago in between his cheeks.  Somewhat firm.  Is not really drained.  I did examination under anesthesia and excise that area along with a suspicious RIGHT posterior perianal lesion.  Came back consistent with high-grade squamous dysplasia in the setting of condyloma.  He saw gastrology and had pneumatic dilatation for his achalasia.  His feels like his dysphagia has gone down.  He struggled with some swelling discomfort and worried if he had another hemorrhoid flare.  That is improved.  Denies any fevers.  He is moving his bowels rather regularly.  She is well.  No bleeding.  No pain.  Daily bowel movements.  No chills or sweats.  He feels no increased sores or lesions.         Diagnosis 1. 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 - NO INVASIVE CARCINOMA. 2. 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 - NO INVASIVE CARCINOMA NORLEEN DOVER MD Pathologist, Electronic Signature (Case signed 08/06/2015) Specimen Gross and Clinical Information Specimen(s) Obtained: 1. Perianus, skin, right posterior 2. Skin , anterior midline intergluteal cleft Specimen Clinical Information 1. Intergluteal mass Gross 1. In formalin are two irregular pieces of gray-brown wrinkled skin, 0.7 x 0.5 x 0.2 cm and 1.2 x 0.4 x 0.2 cm. A discrete lesion is not identified. The tissues are inked, sectioned and entirely submitted in one block. 2. In formalin is a 0.7 x 0.5 cm gray-brown skin with underlying soft fibrous tissue excised to 0.8 cm deep. The specimen is inked, sectioned  and entirely submitted in one block. (SSW:ecj 08/05/2015) Report signed out from the following location(s) Technical Component was performed at [facility]. [address],[place] [address].CLIA:[id number],CAP:[id number]., Interpretation was performed at [facility] [address], [place],  1 of 2 FINAL for [place]MM[place], TOTH [phone]) Report signed out from the following location(s)(continued) [phone]. 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  Diagnostic Studies History Carolan BROCKS Schultze, MD; 12/01/2015 4:51 PM) Colonoscopy   1-5 years ago  Allergies Raelene Rubinstein, CMA; 12/01/2015 3:56 PM) No Known Drug Allergies   07/07/2015  Medication History (Sonya  Bynum, CMA; 12/01/2015 3:57 PM) Lidocaine -Hydrocortisone  Ace  (3-2.5% Kit, 1 (one) Kit Rectal two times daily, as needed, Taken starting 07/07/2015) Active. Complera  (200-25-300MG  Tablet, Oral) Active. TraMADol  HCl  (50MG  Tablet, Oral) Active. Cyclobenzaprine  HCl  (10MG  Tablet, Oral) Active. Diclofenac  Sodium  (1% Gel, Transdermal) Active. Diltiazem  HCl  (60MG  Tablet, Oral) Active. Tivicay   (50MG  Tablet, Oral) Active. Voltaren   (1% Gel, Transdermal) Active. Ibuprofen   (400MG  Tablet, Oral) Active. Ranitidine  HCl  (150MG  Capsule, Oral) Active. Medications Reconciled   Social History Carolan [name] Schultze, MD; 12/01/2015 4:51 PM) Alcohol use   Occasional alcohol use. Caffeine use   Carbonated beverages. Illicit drug use   Uses socially only. Tobacco use   Current every day smoker.  Family History Carolan [name] Schultze, MD; 12/01/2015  4:51 PM) Arthritis   Father, Mother.    Review of Systems Carolan [name] Schultze, MD; 12/01/2015 4:51 PM) General Present- Night Sweats. Not Present- Appetite Loss, Chills, Fatigue, Fever, Weight Gain and Weight Loss. Skin Present- Change in Wart/Mole. Not Present- Dryness, Hives, Jaundice, New Lesions, Non-Healing Wounds, Rash and Ulcer. HEENT Not Present- Earache, Hearing Loss, Hoarseness, Nose Bleed, Oral Ulcers, Ringing in the Ears, Seasonal Allergies, Sinus Pain, Sore Throat, Visual Disturbances, Wears glasses/contact lenses and Yellow Eyes. Respiratory Present- Difficulty Breathing and Snoring. Not Present- Bloody sputum, Chronic Cough and Wheezing. Breast Not Present- Breast Mass, Breast Pain, Nipple Discharge and Skin Changes. Cardiovascular Present- Difficulty Breathing Lying Down. Not Present- Chest Pain, Leg Cramps, Palpitations, Rapid Heart Rate, Shortness of Breath and Swelling of Extremities. Gastrointestinal Present- Abdominal Pain, Change in Bowel Habits, Constipation and Difficulty Swallowing. Not Present- Bloating, Bloody Stool, Chronic diarrhea, Excessive gas, Gets full quickly at meals, Hemorrhoids, Indigestion, Nausea, Rectal Pain and Vomiting. Male Genitourinary Present- Nocturia. Not Present- Blood in Urine, Change in Urinary Stream, Frequency, Impotence, Painful Urination, Urgency and Urine Leakage. Musculoskeletal Present- Back Pain. Not Present- Joint Pain, Joint Stiffness, Muscle Pain, Muscle Weakness and Swelling of Extremities. Neurological Present- Trouble walking. Not Present- Decreased Memory, Fainting, Headaches, Numbness, Seizures, Tingling, Tremor and Weakness. Psychiatric Not Present- Anxiety, Bipolar, Change in Sleep Pattern, Depression, Fearful and Frequent crying. Endocrine Not Present- Cold Intolerance, Excessive Hunger, Hair Changes, Heat Intolerance and New Diabetes. Hematology Present- HIV. Not Present- Easy Bruising, Excessive bleeding, Gland problems and  Persistent Infections.  Vitals (Sonya Bynum CMA; 12/01/2015 3:56 PM) 12/01/2015 3:56 PM Weight: 247 lb   Height: 73.5 in  Body Surface Area: 2.37 m   Body Mass Index: 32.15 kg/m   Pulse: 82 (Regular)    BP: 138/78(Sitting, Left Arm, Standard)       Physical Exam Carolan KYM Schultze MD; 12/01/2015 5:01 PM) General Mental Status - Alert. General Appearance - Not in acute distress. Voice - Normal.  Integumentary Global Assessment Normal Exam - Distribution of scalp and body hair is normal. General Characteristics Overall Skin Surface - no rashes and no suspicious lesions.  Head and Neck Head - normocephalic, atraumatic with no lesions or palpable masses. Face Global Assessment - atraumatic, no absence of expression. Neck Global Assessment - no abnormal movements, no decreased range of motion. Trachea - midline. Thyroid  Gland Characteristics - non-tender.  Eye Eyeball - Left - Extraocular movements intact, No Nystagmus. Eyeball - Right - Extraocular movements intact, No Nystagmus. Upper Eyelid - Left - No Cyanotic. Upper Eyelid - Right - No Cyanotic.  Chest and Lung Exam Inspection Accessory muscles - No use of accessory muscles in breathing.  Abdomen Note:  Soft nontender nondistended.   Male  Genitourinary Note:  Normal external male genitalia.  No obvious sores nor lesions.  No scrotal masses.  No mass on midline raphae   Rectal Note:  Perianal skin clear.  No external warts.  Normal sphincter tone.  No fissure.  No fistula.  Normal sphincter tone. Tolerates digital and anoscopic exam.  Hyperpigmented flat patches at the perianal region, especially right lateral/anteriolateral - consistent with normal external tissue. No ulceration/verrucous changes Scar from prior ablation and resection posterior midline & anterior intergluteal cleft scarring stable without any verrucous changes. Mild linear hyperpigmentation on some anal crypts but not consistent with verrucous  changes. Stable.   Peripheral Vascular Upper Extremity Inspection - Left - Not Gangrenous, No Petechiae. Right - Not Gangrenous, No Petechiae.  Neurologic Neurologic evaluation reveals  - normal attention span and ability to concentrate, able to name objects and repeat phrases. Appropriate fund of knowledge and normal coordination.  Neuropsychiatric Mental status exam performed with findings of - able to articulate well with normal speech/language, rate, volume and coherence and no evidence of hallucinations, delusions, obsessions or homicidal/suicidal ideation. Orientation - oriented X3.  Musculoskeletal Global Assessment Gait and Station - normal gait and station.  Lymphatic General Lymphatics Description - No Generalized lymphadenopathy.   Results Carolan KYM Schultze MD; 12/01/2015 5:05 PM) Procedures  Name Value Date Hemorrhoids Procedure   Other: Perianal skin clear.  No external warts.  Normal sphincter tone.  No fissure.  No fistula.  Normal sphincter tone.  Tolerates digital and anoscopic exam. Hyperpigmented flat patches at the perianal region, especially right lateral/anteriolateral - consistent with normal external tissue.  No ulceration/verrucous changes Scar from prior ablation and resection posterior midline & anterior intergluteal cleft scarring stable without any verrucous changes.  Mild linear hyperpigmentation on some anal crypts but not consistent with verrucous changes.  Stable.  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 Margins positive.  No evidence of any recurrence after last ablation. I think it is reasonable to follow up in about 6 months now. Glad things look better. Current Plans Follow up with us  in the office in 6 MONTHS.  Call us   sooner as needed.  Surgical (Rectal exam) follow-up after resection of condyloma acuminatum (warts):               - every 3 months until negative exam x1, then - every 6 months until negative exan x 1, then - every Year until negative exam x1, then - as needed thereafter  Pt Education - [facility] Anal Warts (Gross) Pt Education - [facility] - General recommendations ANOSCOPY, DIAGNOSTIC (46600) TOBACCO ABUSE (Z72.0) Impression: Discharge importance of quitting smoking to avoid healing and other issues. Current Plans Pt Education - [facility] STOP SMOKING! ESOPHAGEAL DYSMOTILITIES (K22.4) Impression: He tolerated pneumatic dilatation. He would like to give that a try. If that does not succeed, he wonders if I could consider surgery.  I noted that I can discuss that with him. I do perform robotic Heller esophago-cardiomyotomy. Personally I feel that has the best long-term track record. However his risks are elevated with his HIV positive smoking status so I guess it is reasonable to try less morbid approach first with pneumatic dilitation. So far he feels like he has a good result Current Plans Pt Education - [facility] Achalasia HCI (Gross)   Signed electronically  by Elspeth [name] Schultze, MD (12/01/2015 5:06 PM) SURGERY NOTES:  Located in 'Care Everywhere' section of Epic EMR chart   PATHOLOGY:  Located in 'Care Everywhere' section of Epic EMR chart     Physical Examination:   There is no height or weight on file to calculate BMI.   Constitutional: Not cachectic.  Hygeine adequate.  Smells of tobacco.  Leaning to the left side.  Cannot sit normally Eyes: Normal extraocular movements. Sclera nonicteric Neuro: No major focal sensory defects.  No major motor deficits. Psych:  No severe agitation.  No severe anxiety.  Judgment & insight Adequate, Oriented x4, HENT: Normocephalic, Mucus membranes moist.  No thrush.   Neck: Supple, No tracheal deviation.  On the right lower lateral neck is a 2.5 x 2 cm area of  thickening and swelling that correlates with the infected aspirated cyst Chest: Good respiratory excursion.  No audible wheezing CV:  No major extremity edema Ext: No obvious deformity or contracture.  Edema: not present.  No cyanosis Skin:  Warm and dry Musculoskeletal: Severe joint rigidity not present.  Mobility: no assist device moving easily without restrictions  Abdomen:  Flat Hernia: Not present. Incisions Clean & dry with normal healing ridge Nontender.  Diastasis recti: Not present. Soft.   Nondistended.    Gen:  Inguinal hernia: Not present.  Inguinal lymph nodes: without lymphadenopathy.  Guarding clear without any hidradenitis nor condyloma or other abnormalities.  Normal external male genitalia without any condyloma or abnormalities on phallus or scrotum or inguinal region.  1 cm superficial ulceration at the base of the left scrotum with granulation.  Rectal:     ##################################  Perianal skin Clean with good hygiene.  Some mild areas of hypopigmentation consistent with prior ablation/excision but no ulceration.  No AIN.  No nodularity.  Smooth and soft. Pruritis ani: Not present Pilonidal disease: Not present.  In the intergluteal cleft is a 4 x 2 cm region of superficial patchy ulceration.  There are some white fibrous and exposed subcutaneous tissues.  It is only a millimeter deep.  No purulence or abscess.  Mildly sensitive.   Condyloma / warts: Not present  Anal fissure: Not present Perirectal abscess/fistula Not present External hemorrhoids Not present  Digital and anoscopic rectal exam deferred   Sphincter tone Normal   Other significant findings:  N/A  Patient examined by myself with patient in decubitus position .  ###################################        Assessment and Plan:   KAVISH LAFITTE is a 58 y.o. male with history of HIV controlled on retroviral therapy.  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  No evidence of AIN or recurrent condylomatous disease.  Ulceration superficial at scarring from prior pilonidal surgery.  Suspect radiation related.  Small left anterior midline raphae/scrotal area as well very superficial.    There are no diagnoses linked to this encounter.   He has a sensitive but superficial ulcer consistent with old scar and granulation.  It does not seem fibrotic hard verruca's or malignant.  I suspect that he had breakdown from his scar from his pelvic radiation in the setting of metastatic lung cancer and HIV positive status.  Recommend he stop any antibiotic ointment as that can slow wound healing.  Switch to dry powders twice a day in the hopes that can dry up and control things.  I am hopeful that the scrotal ulceration is small and will get better.  I do  worry about the intergluteal cleft scar wound being persistent.  Hopefully this will gradually heal down over time as he gets further on her radiation but I worry with his numerous comorbidities that he may just get a chronic wound there.  We will see.  Fiber bowel regimen to help avoid any anorectal issues.  I again recommend he take something more aggressive than Colace.  Consider MiraLAX twice daily and increase so he is moving his bowels every day  Avoid tobacco products and smoking.  No need for antibiotics from my standpoint.  Continue antibiotic treatment for his right neck cystic mass growing atypical and possible fungal elements.  Defer to Dr. Tobie with otolaryngology.  Chronic pain for numerous reasons.  On chronic pain control regimen.  Probably need to increase regimen given the new sacral pain with radiation.  Defer to cancer center on that regimen.   No follow-ups on file.   The plan was discussed in detail with the patient today, who expressed understanding & appreciation.  The patient has my contact information, and understands to call me with any additional  questions or concerns in the interval.  I would be happy to see the patient back sooner if the need arises.    Elspeth KYM Schultze, MD, FACS, MASCRS Esophageal, Gastrointestinal & Colorectal Surgery Robotic and Minimally Invasive Surgery  [facility], [facility]  [facility]  [address] [place], [place] [address] [phone] Fax [phone] Main              09/27/2024

## 2024-09-27 NOTE — Telephone Encounter (Signed)
 Called patient regarding missing his appointment today.  States he called on yesterday and spoke with someone in Radiation Oncology to cancel his appointments for yesterday and today.  States he thought his appointment for Labs and Infusion were scheduled for 10/07/24.  States he has an appointment scheduled with another doctor for today to have a biopsy done due to a place on his bottom.  Rescheduled his appointment for 10/04/24 for a lab appointment @ 10:00 and an office visit @ 10:30 with Cassie/PA

## 2024-09-28 ENCOUNTER — Telehealth: Payer: Self-pay | Admitting: *Deleted

## 2024-09-28 ENCOUNTER — Other Ambulatory Visit (HOSPITAL_COMMUNITY): Payer: Self-pay | Admitting: Interventional Radiology

## 2024-09-28 ENCOUNTER — Ambulatory Visit

## 2024-09-28 DIAGNOSIS — S32020A Wedge compression fracture of second lumbar vertebra, initial encounter for closed fracture: Secondary | ICD-10-CM

## 2024-09-28 DIAGNOSIS — R6889 Other general symptoms and signs: Secondary | ICD-10-CM | POA: Diagnosis not present

## 2024-09-28 NOTE — Progress Notes (Unsigned)
 [facility] OFFICE PROGRESS NOTE  Benjamin Clam, MD [address] [place] [address] [place] [address]  DIAGNOSIS: Stage IV (T3, N3, M1 C) non-small cell lung cancer, adenocarcinoma presented with large right upper lobe lung mass in addition to right hilar and mediastinal as well as right supraclavicular lymphadenopathy and metastatic bone lesions including the left proximal humerus, L2 as well as sacral area and left adrenal metastasis diagnosed in June 2025.   Molecular studies show PDL1 of 0%   Biomarker Findings Blood Tumor Mutational Burden - 37 Muts/Mb ctDNA Tumor Fraction - High (2.4%) Microsatellite status - MSI-High Not Detected Genomic Findings For a complete list of the genes assayed, please refer to the Appendix. ARID1A E2250* KEAP1 G524C, G470fs*23 CTNNA1 splice site 2192+1G>C DNMT3A R882C TET2 K1197* TP53 splice site 813_[id number], deletion exons 8-9 This assay tested >300 cancer-related genes, including the following 8 gene(s) routinely assessed in this tumor type: ALK, BRAF, EGFR, ERBB2, KRAS, MET, RET, ROS1.    PRIOR THERAPY: 1) Palliative radiation to the painful bone lesion in the left humerus. Last day on 05/03/24.  2) SRS to the brain lesion, completed on on 06/20/24 3) SRS to the new brain lesions, completed on 09/13/24 4) Palliative radiation to the left acetabulum and sacrum last day on 10/10/24.  5) Systemic chemotherapy with Carboplatin  for an AUC of 5, Alimta  500 mg/m2, and Libatyo IV every 3 weeks. First dose on 05/02/24. Doses of Alimta  reduced to 400 mg/m2 starting from cycle #2 due to HIV and neutropenia.  Starting from cycle #5 the patient will be on maintenance Alimta  and Libtayo .  Status post 7 cycles. Discontinued due to disease progression   CURRENT THERAPY: Palliative Systemic chemotherapy with docetaxel 60 mg/m2 (dose reduced) and Cyramza IV every 3 weeks with Neulasta support. First dose expected ~10/22/24.   INTERVAL  HISTORY: Benjamin Herrera 58 y.o. male returns to the clinic today for a follow-up visit.  The patient was last seen in the clinic by Dr. Sherrod on 09/05/24.  He was found to have worsening metastatic disease to the brain recently and underwent SRS.   He also was endorsing back pain and had MRI of the lumbar spine and pelvis which showed progressive large metastasis in the L2 vertebral body with pathologic superior endplate compression fracture and 20% height loss and partially visualized left sacral metastasis.  There is additional metastatic disease at the right iliac bone and left upper acetabulum.   He is currently undergoing radiation to the left hip and back. The last day is scheduled for 10/10/24.   He is scheduled for kyphoplasty on 10/11/24.   The patient is expected to see Dr. Emeline at [facility] on 10/11/2024 to discuss pinning his left acetabulum.  He saw ENT for the parotid tail lesion.  He was put on clindamycin .  He states this went down significantly and was told that this was felt to be related to his HIV. The patient has a history of HIV and is under the care of Dr. Eben.  His appointment radiation oncology he was endorsing gluteal cleft pain and drainage.  He saw one of the PAs at [facility] surgery on 09/25/2024 and they felt this may have been related to radiation per patient report.   The patient completed 4 cycles of chemotherapy and immunotherapy.  Starting from cycle #5, the patient started maintenance chemotherapy with Alimta  and immunotherapy with Libtayo . He was having worsening anemia and his alimta  was discontinued for  cycle #6. He did receive alimta  with cycle #7.   He last was seen in the clinic by Dr. Sherrod on 09/05/2024.  The patient was endorsing back spasms at that time and neck spasms.  Dr. Sherrod arrange for a repeat brain MRI of the chest, abdomen, pelvis.   He continues to experience significant unintentional weight loss, with a  decrease from 180 lbs to 175 lbs despite consuming two to three Boost nutritional supplements daily. He reports persistent anorexia, dysgeusia, and abdominal changes consistent with cachexia. He has previously seen a nutritionist. He denies fever, chills, or night sweats. He has occasional nausea, controlled with antiemetics, and denies vomiting. He experiences exertional dyspnea but no cough, hemoptysis, headaches, or visual disturbances. He has chronic pain related to bone metastases, particularly in the back and pelvis, and is awaiting a refill of his pain medication. He follows with palliative care.   He recently had a restaging CT scan.   He is here today for evaluation and to review his scan and discuss the next steps in his care.   MEDICAL HISTORY: Past Medical History:  Diagnosis Date   Achalasia and cardiospasm    Allergy    Anxiety    Arthritis    back ,shoulders    Asthma    Boils    under arms hx of   CAD (coronary artery disease)    a. 09/2016: cath showing 100% RCA occlusion with collaterals and 90% OM1 (treated with DES)   Candidiasis of mouth    Candidiasis of unspecified site    Cholelithiasis    Cocaine abuse (HCC)    Quit 09/2016   Condyloma acuminatum in male of scrotum & anal canal s/p laser ablation 07/03/2012   Coronary artery disease    Dysphagia    Fracture, humerus    Frozen shoulder    left   GERD (gastroesophageal reflux disease)    Heart attack (HCC)    Hemorrhoids    Hepatitis 1993   A   Herpes labialis    HIV (human immunodeficiency virus infection) (HCC) DX 1993   Hypertension Dx 2014   off all bp meds for last 9 or 10 months   Pilonidal disease 01/10/2012   Polysubstance abuse (HCC)    Psoriasis    hx of   Sleep apnea    does  have CPAP   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  MEDICATIONS:  Current Outpatient Medications  Medication Sig Dispense Refill   dexamethasone  (DECADRON ) 4 MG tablet Take TWO tablets TWICE a day the day before chemotherapy, the day of chemotherapy, and the day after chemotherapy 40 tablet 2   mirtazapine  (REMERON ) 15 MG tablet Take 1 tablet (15 mg total) by mouth at bedtime. 30 tablet 2   acetaminophen  (TYLENOL ) 325 MG tablet Take 650 mg by mouth every 6 (six) hours as needed for mild pain (pain score 1-3), moderate pain (pain score 4-6) or headache.     albuterol  (VENTOLIN  HFA) 108 (90 Base) MCG/ACT inhaler Inhale 2 puffs into the lungs every 6 (six) hours as needed for wheezing or shortness of breath. 1 each 2   amLODipine  (NORVASC ) 5 MG tablet Take 1 tablet (5 mg total) by mouth daily. (Patient not taking: Reported on 09/10/2024) 90 tablet 1   amoxicillin -clavulanate (AUGMENTIN ) 875-125 MG tablet Take  1 tablet by mouth 2 (two) times daily. 20 tablet 0   aspirin  81 MG chewable tablet Chew 1 tablet (81 mg total) by mouth daily. 90 tablet 3   Bictegravir-Emtricitab-Tenofov (BIKTARVY ) 30-120-15 MG TABS Take 1 tablet by mouth daily. 90 tablet 3   Blood Pressure KIT 1 kit by Does not apply route daily. 1 kit 0   diclofenac  Sodium (VOLTAREN  ARTHRITIS PAIN) 1 % GEL Apply 2 g topically 4 (four) times daily. 100 g 3   folic acid  (FOLVITE ) 1 MG tablet Take 1 tablet (1 mg total) by mouth daily. Start 7 days before pemetrexed  chemotherapy. Continue until 21 days after pemetrexed  completed. 30 tablet 3   hydrOXYzine  (ATARAX ) 25 MG tablet Take 1 tablet (25 mg total) by mouth 3 (three) times daily as needed. 30 tablet 0   lidocaine  (XYLOCAINE ) 2 % solution SWISH AND SWALLOW 5 MLS 3 (THREE) TIMES DAILY AS NEEDED FOR MOUTH PAIN. 360 mL 1   lidocaine -prilocaine  (EMLA ) cream Apply to affected area once 30 g 3   LORazepam  (ATIVAN ) 0.5 MG tablet TAKE 1 TAB BY MOUTH 30 MINUTES PRIOR TO RADIATION OR MRI SCANS 4 tablet 0   magic mouthwash (nystatin ,  diphenhydrAMINE , alum & mag hydroxide) suspension mixture Take 5 mLs by mouth 3 (three) times daily as needed for mouth pain. 210 mL 1   methocarbamol  (ROBAXIN ) 500 MG tablet Take 2 tablets (1,000 mg total) by mouth every 8 (eight) hours as needed. 90 tablet 1   metoprolol  succinate (TOPROL -XL) 25 MG 24 hr tablet TAKE 1/2 TABLET BY MOUTH DAILY 45 tablet 0   nitroGLYCERIN  (NITROSTAT ) 0.4 MG SL tablet Place 1 tablet (0.4 mg total) under the tongue every 5 (five) minutes as needed for chest pain (trouble swallowing). 25 tablet 1   nystatin  (MYCOSTATIN ) 100000 UNIT/ML suspension Take by mouth.     ondansetron  (ZOFRAN ) 8 MG tablet Take 1 tablet (8 mg total) by mouth every 8 (eight) hours as needed for nausea or vomiting. Start on the third day after carboplatin . 30 tablet 1   oxyCODONE -acetaminophen  (PERCOCET/ROXICET) 5-325 MG tablet Take 1 tablet by mouth every 6 (six) hours as needed for severe pain (pain score 7-10). 60 tablet 0   potassium chloride  SA (KLOR-CON  M) 20 MEQ tablet Take 1 tablet (20 mEq total) by mouth daily. 6 tablet 0   predniSONE  (DELTASONE ) 50 MG tablet Take one tablet 13 hours before , one tablet 7 hours before and one tablet one hour prior to CT scan. Take benadryl  50 mg one hour prior to scan 3 tablet 0   prochlorperazine  (COMPAZINE ) 10 MG tablet Take 1 tablet (10 mg total) by mouth every 6 (six) hours as needed for nausea or vomiting. 30 tablet 1   sulfamethoxazole -trimethoprim  (BACTRIM  DS) 800-160 MG tablet Take 1 tablet by mouth 3 (three) times a week. 36 tablet 3   No current facility-administered medications for this visit.    SURGICAL HISTORY:  Past Surgical History:  Procedure Laterality Date   BALLOON DILATION N/A 11/11/2015   Procedure: BALLOON DILATION;  Surgeon: Gustav Shila GAILS, MD;  Location: [facility];  Service: Endoscopy;  Laterality: N/A;   BOTOX  INJECTION  10/11/2012   Procedure: BOTOX  INJECTION;  Surgeon: Lamar JONETTA Aho, MD;  Location: [facility];   Service: Endoscopy;  Laterality: N/A;   BOTOX  INJECTION N/A 01/25/2013   Procedure: BOTOX  INJECTION;  Surgeon: Lamar JONETTA Aho, MD;  Location: [facility];  Service: Endoscopy;  Laterality: N/A;   BOTOX  INJECTION N/A 06/21/2013  Procedure: BOTOX  INJECTION;  Surgeon: Lamar JONETTA Aho, MD;  Location: [facility];  Service: Endoscopy;  Laterality: N/A;   BOTOX  INJECTION N/A 10/08/2013   Procedure: BOTOX  INJECTION;  Surgeon: Lamar JONETTA Aho, MD;  Location: [facility];  Service: Endoscopy;  Laterality: N/A;   BOTOX  INJECTION N/A 06/16/2015   Procedure: BOTOX  INJECTION;  Surgeon: Lamar JONETTA Aho, MD;  Location: [facility];  Service: Endoscopy;  Laterality: N/A;   BRONCHIAL BIOPSY  04/03/2024   Procedure: BRONCHOSCOPY, WITH BIOPSY;  Surgeon: Shelah Lamar RAMAN, MD;  Location: [facility];  Service: Pulmonary;;   BRONCHIAL BRUSHINGS  04/03/2024   Procedure: BRONCHOSCOPY, WITH BRUSH BIOPSY;  Surgeon: Shelah Lamar RAMAN, MD;  Location: [facility];  Service: Pulmonary;;   BRONCHIAL NEEDLE ASPIRATION BIOPSY  04/03/2024   Procedure: BRONCHOSCOPY, WITH NEEDLE ASPIRATION BIOPSY;  Surgeon: Shelah Lamar RAMAN, MD;  Location: [facility];  Service: Pulmonary;;   BRONCHOSCOPY, WITH BIOPSY USING ELECTROMAGNETIC NAVIGATION Right 04/03/2024   Procedure: BRONCHOSCOPY, WITH BIOPSY USING ELECTROMAGNETIC NAVIGATION;  Surgeon: Shelah Lamar RAMAN, MD;  Location: [facility];  Service: Pulmonary;  Laterality: Right;   CARDIAC CATHETERIZATION N/A 10/11/2016   Procedure: Left Heart Cath and Coronary Angiography;  Surgeon: Lonni JONETTA Cash, MD;  mRCA 100% w/ L>R collaterals, OM1 90%   CARDIAC CATHETERIZATION N/A 10/11/2016   Procedure: Coronary Stent Intervention;  Surgeon: Lonni JONETTA Cash, MD;  RESOLUTE ONYX 3.5X15 DES OM1   COLONOSCOPY  05/19/2012   Procedure: COLONOSCOPY;  Surgeon: Lamar JONETTA Aho, MD;  Location: [facility];  Service: Endoscopy;  Laterality: N/A;  jill trying to contact pt to come in 0830 for 930  case, phone not accepting messages   COLONOSCOPY     ESOPHAGEAL MANOMETRY  05/29/2012   Procedure: ESOPHAGEAL MANOMETRY (EM);  Surgeon: Lamar JONETTA Aho, MD;  Location: [facility];  Service: Endoscopy;  Laterality: N/A;   ESOPHAGEAL MANOMETRY N/A 10/06/2015   Procedure: ESOPHAGEAL MANOMETRY (EM);  Surgeon: Gustav Shila GAILS, MD;  Location: [facility];  Service: Endoscopy;  Laterality: N/A;   ESOPHAGOGASTRODUODENOSCOPY  11/11/2011   Procedure: ESOPHAGOGASTRODUODENOSCOPY (EGD);  Surgeon: Lamar JONETTA Aho, MD;  Location: [facility];  Service: Endoscopy;  Laterality: N/A;  botox  injection  called Pt to change time of procedure per Dr Aho   ESOPHAGOGASTRODUODENOSCOPY  03/10/2012   Procedure: ESOPHAGOGASTRODUODENOSCOPY (EGD);  Surgeon: Lamar JONETTA Aho, MD;  Location: [facility];  Service: Endoscopy;  Laterality: N/A;   ESOPHAGOGASTRODUODENOSCOPY  10/11/2012   Procedure: ESOPHAGOGASTRODUODENOSCOPY (EGD);  Surgeon: Lamar JONETTA Aho, MD;  Location: [facility];  Service: Endoscopy;  Laterality: N/A;   ESOPHAGOGASTRODUODENOSCOPY N/A 01/25/2013   Procedure: ESOPHAGOGASTRODUODENOSCOPY (EGD);  Surgeon: Lamar JONETTA Aho, MD;  Location: [facility];  Service: Endoscopy;  Laterality: N/A;   ESOPHAGOGASTRODUODENOSCOPY N/A 06/21/2013   Procedure: ESOPHAGOGASTRODUODENOSCOPY (EGD);  Surgeon: Lamar JONETTA Aho, MD;  Location: [facility];  Service: Endoscopy;  Laterality: N/A;   ESOPHAGOGASTRODUODENOSCOPY (EGD) WITH PROPOFOL  N/A 10/08/2013   Procedure: ESOPHAGOGASTRODUODENOSCOPY (EGD) WITH PROPOFOL ;  Surgeon: Lamar JONETTA Aho, MD;  [facility]: [facility];  Service: Endoscopy;  Laterality: N/A;   ESOPHAGOGASTRODUODENOSCOPY (EGD) WITH PROPOFOL  N/A 06/16/2015   Procedure: ESOPHAGOGASTRODUODENOSCOPY (EGD) WITH PROPOFOL ;  Surgeon: Lamar JONETTA Aho, MD;  [facility]: [facility];  Service: Endoscopy;  Laterality: N/A;   ESOPHAGOGASTRODUODENOSCOPY (EGD) WITH PROPOFOL  N/A 11/11/2015   Procedure: ESOPHAGOGASTRODUODENOSCOPY  (EGD) WITH PROPOFOL ;  Surgeon: Gustav Shila GAILS, MD;  Location: [facility];  Service: Endoscopy;  Laterality: N/A;   EXAMINATION UNDER ANESTHESIA  07/24/2012   Procedure: EXAM UNDER ANESTHESIA;  Surgeon: Elspeth BROCKS.  Gross, MD;  Location: [facility];  Service: General;  Laterality: N/A;   humeral fracture surgery Right yrs ago   IR RADIOLOGIST EVAL & MGMT  09/12/2024   PILONIDAL CYST EXCISION  07/24/2012   Procedure: CYST EXCISION PILONIDAL SIMPLE;  Surgeon: Elspeth KYM Schultze, MD;  Location: [facility];  Service: General;  Laterality: N/A;  Exam Under Anesthesia,, Excision Pilonidal Disease,    right shoulder replacement  2008   x 2   UPPER GASTROINTESTINAL ENDOSCOPY     WART FULGURATION  07/24/2012   Procedure: FULGURATION ANAL WART;  Surgeon: Elspeth KYM Schultze, MD;  Location: [facility];  Service: General;  Laterality: N/A;  excision of raphe mass    REVIEW OF SYSTEMS:   Review of Systems  Constitutional: Positive for fatigue, decreased appetite, and weight loss. Negative for chills and fever.  HENT: Positive for right parotid lesion. Negative for mouth sores, nosebleeds, sore throat and trouble swallowing.   Eyes: Negative for eye problems and icterus.  Respiratory: Negative for cough, hemoptysis, shortness of breath and wheezing.   Cardiovascular: Negative for chest pain and leg swelling.  Gastrointestinal: Positive for occasional nausea. Negative for abdominal pain, constipation, diarrhea, and vomiting.  Genitourinary: Negative for bladder incontinence, difficulty urinating, dysuria, frequency and hematuria.   Musculoskeletal: Positive for cancer related pain.  Negative for gait problem, neck pain and neck stiffness.  Skin: Negative for itching and rash.  Neurological: Negative for dizziness, extremity weakness, gait problem, headaches, light-headedness and seizures.  Hematological: Negative for adenopathy. Does not bruise/bleed easily.  Psychiatric/Behavioral: Negative for confusion, depression and  sleep disturbance. The patient is not nervous/anxious.     PHYSICAL EXAMINATION:  Blood pressure 134/78, pulse (!) 108, temperature 98.3 F (36.8 C), temperature source Temporal, resp. rate 17, height 6' 1 (1.854 m), weight 175 lb (79.4 kg), SpO2 100%.  ECOG PERFORMANCE STATUS: 1  Physical Exam  Constitutional: Oriented to person, place, and time and well-developed, well-nourished, and in no distress.  HENT:  Head: Positive for parotid lesion. Normocephalic and atraumatic.  Mouth/Throat: Oropharynx is clear and moist. No oropharyngeal exudate.  Eyes: Conjunctivae are normal. Right eye exhibits no discharge. Left eye exhibits no discharge. No scleral icterus.  Neck: Normal range of motion. Neck supple.  Cardiovascular: Normal rate, regular rhythm, normal heart sounds and intact distal pulses.   Pulmonary/Chest: Effort normal and breath sounds normal. No respiratory distress. No wheezes. No rales.  Abdominal: Soft. Bowel sounds are normal. Exhibits no distension and no mass. There is no tenderness.  Musculoskeletal: Normal range of motion. Exhibits no edema.  Lymphadenopathy:    No cervical adenopathy.  Neurological: Alert and oriented to person, place, and time. Exhibits normal muscle tone. Gait normal. Coordination normal.  Skin: Skin is warm and dry. No rash noted. Not diaphoretic. No erythema. No pallor.  Psychiatric: Mood, memory and judgment normal.  Vitals reviewed.  LABORATORY DATA: Lab Results  Component Value Date   WBC 4.3 10/04/2024   HGB 7.3 (L) 10/04/2024   HCT 22.6 (L) 10/04/2024   MCV 87.3 10/04/2024   PLT 257 10/04/2024      Chemistry      Component Value Date/Time   NA 137 10/04/2024 1023   NA 136 06/19/2024 1445   K 3.4 (L) 10/04/2024 1023   CL 102 10/04/2024 1023   CO2 24 10/04/2024 1023   BUN 13 10/04/2024 1023   BUN 20 06/19/2024 1445   CREATININE 1.73 (H) 10/04/2024 1023   CREATININE 0.96 07/30/2020 1437  Component Value Date/Time   CALCIUM   9.4 10/04/2024 1023   ALKPHOS 68 10/04/2024 1023   AST 25 10/04/2024 1023   ALT 9 10/04/2024 1023   BILITOT 0.2 10/04/2024 1023       RADIOGRAPHIC STUDIES:  CT CHEST ABDOMEN PELVIS W CONTRAST Result Date: 09/24/2024 CLINICAL DATA:  Non-small cell lung cancer. Ongoing left pelvic pain. Shortness of breath and weight loss. Constipation. * Tracking Code: BO * EXAM: CT CHEST, ABDOMEN, AND PELVIS WITH CONTRAST TECHNIQUE: Multidetector CT imaging of the chest, abdomen and pelvis was performed following the standard protocol during bolus administration of intravenous contrast. RADIATION DOSE REDUCTION: This exam was performed according to the departmental dose-optimization program which includes automated exposure control, adjustment of the mA and/or kV according to patient size and/or use of iterative reconstruction technique. CONTRAST:  OMNIPAQUE  IOHEXOL  300 MG/ML  SOLN COMPARISON:  07/09/2024 and PET 04/09/2024. FINDINGS: CT CHEST FINDINGS Cardiovascular: Atherosclerotic calcification of the aorta with age advanced involvement of the left anterior descending and circumflex coronary arteries. Heart size normal. No pericardial effusion. Mediastinum/Nodes: Right low internal jugular and supraclavicular lymph nodes appear to have increased in size and number. 9 mm index lymph node (2/6), previously 7 mm. 7 mm right supraclavicular lymph node (2/6) appears new. No pathologically enlarged mediastinal or axillary lymph nodes. Hilar regions are difficult to definitively evaluate without IV contrast. Air and fluid are seen in a dilated esophagus. Mid and lower esophageal wall is minimally thickened, as before. Lungs/Pleura: Spiculated mass in the medial aspect of the apical segment right upper lobe measures approximately 2.4 x 3.4 cm (4/27), with invasion of the superior mediastinum, grossly stable in size when remeasured in similar fashion. Centrilobular and paraseptal emphysema. Question postoperative  scarring in the posterior segment right upper lobe. New peribronchovascular consolidation and ground-glass in the medial segment right middle lobe. Small right pleural effusion. 4 mm anterior segment left upper lobe nodule (4/69), as before. No pleural fluid. Airway is unremarkable. Musculoskeletal: Right shoulder arthroplasty. Slight compression of the T8 superior endplate, old. Similar healing or healed pathologic left proximal humerus fracture. Additional osseous metastatic disease better visualized on PET 04/09/2024. CT ABDOMEN PELVIS FINDINGS Hepatobiliary: Liver is unremarkable. Gallstone. No biliary ductal dilatation. Pancreas: Negative. Spleen: Negative. Adrenals/Urinary Tract: Right adrenal gland is unremarkable. New lateral limb left adrenal nodule measures 1.7 x 2.3 cm. Renal cortical thinning and slight scarring bilaterally. Tiny low-attenuation lesion in the right kidney, too small to characterize. No specific follow-up necessary. Possible vague low density in the anterior interpolar left kidney (7/22), not well seen on 07/09/2024. Ureters are decompressed. Bladder is relatively low in volume. Right posterolateral bladder diverticulum. Stomach/Bowel: Stomach, small bowel, appendix and colon are unremarkable. Vascular/Lymphatic: Atherosclerotic calcification of the aorta. No pathologically enlarged lymph nodes. Inguinal lymph nodes are not enlarged by CT size criteria. Reproductive: Prostate is normal in size. Other: No free fluid.  Mesenteries and peritoneum are unremarkable. Musculoskeletal: Lucency and compression involving the L2 superior endplate, unchanged. Additional osseous metastatic disease, better seen on PET 04/09/2024. IMPRESSION: 1. New/enlarging low right internal jugular/right supraclavicular lymph nodes with a new left adrenal nodule, findings compatible with disease progression. 2. Spiculated right upper lobe mass is grossly stable in size. 3. Old pathologic proximal left humerus and L2  superior endplate fractures. Additional osseous metastatic disease, better visualized on PET 04/09/2024. 4. New peribronchovascular ground-glass and consolidation in the medial segment right upper lobe, likely infectious/inflammatory in etiology. 5. Esophageal dysmotility with similar mid and lower esophageal wall  thickening, possibly due to radiation therapy. 6. Possible vague low-density in the anterior interpolar left kidney, not previously seen. Recommend attention on follow-up. 7.  Age advanced two vessel coronary artery calcification. 8.  Aortic atherosclerosis (ICD10-I70.0). Electronically Signed   By: Newell Eke M.D.   On: 09/24/2024 13:46   IR Radiologist Eval & Mgmt Result Date: 09/12/2024 EXAM: NEW PATIENT OFFICE VISIT CHIEF COMPLAINT: See below HISTORY OF PRESENT ILLNESS: See below REVIEW OF SYSTEMS: See below PHYSICAL EXAMINATION: See below ASSESSMENT AND PLAN: Please refer to completed note in the electronic medical record on [place] [place] [facility] Thom Hall, MD [facility] Electronically Signed   By: Thom Hall M.D.   On: 09/12/2024 14:22   MR Pelvis W Wo Contrast Result Date: 09/11/2024 EXAM: MRI OF THE PELVIS WITH AND WITHOUT INTRAVENOUS CONTRAST 09/07/2024 06:19:52 PM TECHNIQUE: Multiplanar magnetic resonance images of the pelvis with and without intravenous contrast. 8 mL of GADAVIST  was administered. COMPARISON: CT pelvis 07/09/2024. CLINICAL HISTORY: Metastatic disease evaluation and sacral met on prior MRI. FINDINGS: SOFT TISSUES: The soft tissues are unremarkable. JOINTS: The sacroiliac joints appear unremarkable. No dislocation or significant effusion. LIMITED INTRAPELVIC CONTENTS: No acute abnormality. BONES: 0.9 cm lesion with high T2 and low T1 signal characteristics in the right iliac bone on image 14 series 4 compatible with metastatic disease. 3.7 x 2.6 cm left sacral metastatic lesion with high T2 and low T1 signal  characteristics. This was substantially smaller on the PET CT from 04/09/2024, compatible with progression. 2.0 x 1.8 cm metastatic lesion of the left upper acetabulum on image 27 series 4 with high T2 and low T1 signal characteristics. These metastatic lesions demonstrate only low grade enhancement compared to the surrounding bony marrow. No sacral foraminal impingement currently identified. No impingement on the sacral plexus or proximal sciatic nerves. IMPRESSION: 1. Progression of the left sacral metastatic lesion, now measuring 3.7 x 2.6 cm, compared to PET CT from 04/09/24. 2. Additional metastatic lesions in the right iliac bone (0.9 cm) and left upper acetabulum (2.0 x 1.8 cm). 3. No sacral foraminal impingement or impingement on the sacral plexus or proximal sciatic nerves. Electronically signed by: Ryan Salvage MD 09/11/2024 01:15 PM EST RP Workstation: [id number]     ASSESSMENT/PLAN:  This is a very pleasant 58 years old African-American male recently diagnosed with a stage IV (T3, N3, M1 C) non-small cell lung cancer, adenocarcinoma presented with large right upper lobe lung mass in addition to right hilar and mediastinal as well as right supraclavicular lymphadenopathy and metastatic bone lesions including the left proximal humerus, L2 as well as sacral area and left adrenal metastasis diagnosed in June 2025.   His molecular studies show no actionable mutations. He has high TMB. 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   The patient is currently on chemotherapy at a reduced dose with carboplatin  for an AUC of 4 and Alimta  400 mg/m, and Libtayo  IV every 3 weeks.  He is status post 7 cycles of treatment.  Starting from cycle #5 the patient started maintenance treatment with Alimta  400 mg/m and Libtayo  IV every 3 weeks.   Patient was recently found to have additional sites of brain metastasis as well as some progressive disease to the bones.  The patient recently had a restaging CT scan.  The  patient was seen with Dr. Sherrod today.  Dr. Sherrod personally and independently reviewed the scan.  The scan also showed a new adrenal nodule and new/enlarging low right  internal jugular/right supraclavicular lymph nodes.   Dr. Sherrod had a lengthy discussion with the patient today about his current condition and treatment options.  The patient does have some significant intolerance to chemotherapy with cytopenias.  The patient's hemoglobin is 7.3 today.  We will arrange for 2 units of blood to be administered on Saturday, 10/06/2024.  Dr. Sherrod discussed that his options include consideration of palliative care and hospice which is reasonable in a patient with his condition.  Other alternative chemotherapies include regimen with docetaxel and Cyramza versus single agent gemcitabine.  After discussion the patient would like to try additional chemotherapy.  Therefore he will be started on dose reduced docetaxel 60 mg/m and Cyramza IV every 3 weeks with Neulasta support.  We will start this the week after Christmas.  In the meantime he will complete his palliative radiation to the acetabulum and back.  He is scheduled for kyphoplasty next week.  He also has a follow-up visit with Ortho oncology in [facility] next week.  I sent a prescription for Decadron  to the pharmacy to take 2 tablets twice a day the day before, the day of, and the day after.   The creatinine is elevated today.  We will arrange for additional IV fluids today.  He will see palliative care today.  His appetite we discussed Remeron , Megace, and Marinol.  We decided on Remeron  15 mg to take at nighttime. Will consider him for blood transfusion if his hemoglobin were less than 8.  I have standing orders for sample of blood bank.   He recently underwent SRS to the processive metastatic brain lesions on 09/07/24.   The patient was advised to call immediately if she has any concerning symptoms in the interval. The patient  voices understanding of current disease status and treatment options and is in agreement with the current care plan. All questions were answered. The patient knows to call the clinic with any problems, questions or concerns. We can certainly see the patient much sooner if necessary    No orders of the defined types were placed in this encounter.    I spent {CHL ONC TIME VISIT - DTPQU:[id number]} counseling the patient face to face. The total time spent in the appointment was {CHL ONC TIME VISIT - DTPQU:[id number]}.  Oluwademilade Kellett L Kayn Haymore, PA-C 10/04/2024

## 2024-09-28 NOTE — Telephone Encounter (Signed)
 Spoke with the patient who states he would not make it to his radiation treatment today.  He states he had a biopsy done yesterday and is unable to lay on his back.  I let him know I would inform the treating staff and we would cancel his treatment today.

## 2024-10-01 ENCOUNTER — Other Ambulatory Visit: Payer: Self-pay

## 2024-10-01 ENCOUNTER — Other Ambulatory Visit (HOSPITAL_COMMUNITY): Payer: Self-pay

## 2024-10-01 ENCOUNTER — Encounter: Payer: Self-pay | Admitting: Internal Medicine

## 2024-10-01 ENCOUNTER — Encounter: Payer: Self-pay | Admitting: Physician Assistant

## 2024-10-01 ENCOUNTER — Ambulatory Visit: Admitting: Radiation Oncology

## 2024-10-01 ENCOUNTER — Encounter

## 2024-10-01 ENCOUNTER — Ambulatory Visit
Admission: RE | Admit: 2024-10-01 | Discharge: 2024-10-01 | Disposition: A | Source: Ambulatory Visit | Attending: Radiation Oncology

## 2024-10-01 DIAGNOSIS — Z5112 Encounter for antineoplastic immunotherapy: Secondary | ICD-10-CM | POA: Diagnosis not present

## 2024-10-01 DIAGNOSIS — R6889 Other general symptoms and signs: Secondary | ICD-10-CM | POA: Diagnosis not present

## 2024-10-01 LAB — RAD ONC ARIA SESSION SUMMARY
Course Elapsed Days: 28
Plan Fractions Treated to Date: 3
Plan Prescribed Dose Per Fraction: 3 Gy
Plan Total Fractions Prescribed: 10
Plan Total Prescribed Dose: 30 Gy
Reference Point Dosage Given to Date: 9 Gy
Reference Point Session Dosage Given: 3 Gy
Session Number: 14

## 2024-10-02 ENCOUNTER — Ambulatory Visit
Admission: RE | Admit: 2024-10-02 | Discharge: 2024-10-02 | Disposition: A | Source: Ambulatory Visit | Attending: Radiation Oncology

## 2024-10-02 ENCOUNTER — Other Ambulatory Visit (HOSPITAL_COMMUNITY): Payer: Self-pay

## 2024-10-02 ENCOUNTER — Ambulatory Visit

## 2024-10-02 ENCOUNTER — Other Ambulatory Visit: Payer: Self-pay

## 2024-10-02 DIAGNOSIS — Z5112 Encounter for antineoplastic immunotherapy: Secondary | ICD-10-CM | POA: Diagnosis not present

## 2024-10-02 LAB — RAD ONC ARIA SESSION SUMMARY
Course Elapsed Days: 29
Plan Fractions Treated to Date: 4
Plan Prescribed Dose Per Fraction: 3 Gy
Plan Total Fractions Prescribed: 10
Plan Total Prescribed Dose: 30 Gy
Reference Point Dosage Given to Date: 12 Gy
Reference Point Session Dosage Given: 3 Gy
Session Number: 15

## 2024-10-03 ENCOUNTER — Ambulatory Visit

## 2024-10-03 ENCOUNTER — Ambulatory Visit
Admission: RE | Admit: 2024-10-03 | Discharge: 2024-10-03 | Disposition: A | Discharge status: Home / Self Care | Source: Ambulatory Visit | Attending: Radiation Oncology | Admitting: Radiation Oncology

## 2024-10-03 ENCOUNTER — Other Ambulatory Visit: Payer: Self-pay

## 2024-10-03 DIAGNOSIS — C7951 Secondary malignant neoplasm of bone: Secondary | ICD-10-CM | POA: Diagnosis not present

## 2024-10-03 DIAGNOSIS — Z5112 Encounter for antineoplastic immunotherapy: Secondary | ICD-10-CM | POA: Diagnosis not present

## 2024-10-03 DIAGNOSIS — Z87891 Personal history of nicotine dependence: Secondary | ICD-10-CM | POA: Diagnosis not present

## 2024-10-03 DIAGNOSIS — C3411 Malignant neoplasm of upper lobe, right bronchus or lung: Secondary | ICD-10-CM | POA: Diagnosis not present

## 2024-10-03 DIAGNOSIS — Z510 Encounter for antineoplastic radiation therapy: Secondary | ICD-10-CM | POA: Diagnosis not present

## 2024-10-03 LAB — RAD ONC ARIA SESSION SUMMARY
Course Elapsed Days: 30
Plan Fractions Treated to Date: 5
Plan Prescribed Dose Per Fraction: 3 Gy
Plan Total Fractions Prescribed: 10
Plan Total Prescribed Dose: 30 Gy
Reference Point Dosage Given to Date: 15 Gy
Reference Point Session Dosage Given: 3 Gy
Session Number: 16

## 2024-10-04 ENCOUNTER — Inpatient Hospital Stay: Admitting: Nurse Practitioner

## 2024-10-04 ENCOUNTER — Ambulatory Visit

## 2024-10-04 ENCOUNTER — Inpatient Hospital Stay: Attending: Physician Assistant

## 2024-10-04 ENCOUNTER — Other Ambulatory Visit: Payer: Self-pay

## 2024-10-04 ENCOUNTER — Inpatient Hospital Stay: Admitting: Physician Assistant

## 2024-10-04 ENCOUNTER — Inpatient Hospital Stay

## 2024-10-04 ENCOUNTER — Other Ambulatory Visit: Payer: Self-pay | Admitting: Physician Assistant

## 2024-10-04 ENCOUNTER — Ambulatory Visit
Admission: RE | Admit: 2024-10-04 | Discharge: 2024-10-04 | Disposition: A | Discharge status: Home / Self Care | Source: Ambulatory Visit | Attending: Radiation Oncology | Admitting: Radiation Oncology

## 2024-10-04 ENCOUNTER — Encounter: Payer: Self-pay | Admitting: Nurse Practitioner

## 2024-10-04 VITALS — BP 134/78 | HR 108 | Temp 98.3°F | Resp 17 | Ht 73.0 in | Wt 175.0 lb

## 2024-10-04 DIAGNOSIS — R634 Abnormal weight loss: Secondary | ICD-10-CM | POA: Diagnosis not present

## 2024-10-04 DIAGNOSIS — C3491 Malignant neoplasm of unspecified part of right bronchus or lung: Secondary | ICD-10-CM

## 2024-10-04 DIAGNOSIS — R197 Diarrhea, unspecified: Secondary | ICD-10-CM | POA: Diagnosis not present

## 2024-10-04 DIAGNOSIS — G893 Neoplasm related pain (acute) (chronic): Secondary | ICD-10-CM | POA: Diagnosis not present

## 2024-10-04 DIAGNOSIS — Z515 Encounter for palliative care: Secondary | ICD-10-CM

## 2024-10-04 DIAGNOSIS — G4700 Insomnia, unspecified: Secondary | ICD-10-CM | POA: Diagnosis not present

## 2024-10-04 DIAGNOSIS — K118 Other diseases of salivary glands: Secondary | ICD-10-CM | POA: Insufficient documentation

## 2024-10-04 DIAGNOSIS — M545 Low back pain, unspecified: Secondary | ICD-10-CM | POA: Insufficient documentation

## 2024-10-04 DIAGNOSIS — C7951 Secondary malignant neoplasm of bone: Secondary | ICD-10-CM | POA: Insufficient documentation

## 2024-10-04 DIAGNOSIS — E860 Dehydration: Secondary | ICD-10-CM

## 2024-10-04 DIAGNOSIS — C7972 Secondary malignant neoplasm of left adrenal gland: Secondary | ICD-10-CM | POA: Insufficient documentation

## 2024-10-04 DIAGNOSIS — Z7189 Other specified counseling: Secondary | ICD-10-CM | POA: Insufficient documentation

## 2024-10-04 DIAGNOSIS — M79603 Pain in arm, unspecified: Secondary | ICD-10-CM | POA: Insufficient documentation

## 2024-10-04 DIAGNOSIS — B20 Human immunodeficiency virus [HIV] disease: Secondary | ICD-10-CM | POA: Insufficient documentation

## 2024-10-04 DIAGNOSIS — C3411 Malignant neoplasm of upper lobe, right bronchus or lung: Secondary | ICD-10-CM | POA: Insufficient documentation

## 2024-10-04 DIAGNOSIS — C7931 Secondary malignant neoplasm of brain: Secondary | ICD-10-CM | POA: Insufficient documentation

## 2024-10-04 DIAGNOSIS — G8929 Other chronic pain: Secondary | ICD-10-CM | POA: Insufficient documentation

## 2024-10-04 DIAGNOSIS — D649 Anemia, unspecified: Secondary | ICD-10-CM

## 2024-10-04 DIAGNOSIS — Z5112 Encounter for antineoplastic immunotherapy: Secondary | ICD-10-CM | POA: Diagnosis not present

## 2024-10-04 DIAGNOSIS — K5900 Constipation, unspecified: Secondary | ICD-10-CM | POA: Diagnosis not present

## 2024-10-04 DIAGNOSIS — R6889 Other general symptoms and signs: Secondary | ICD-10-CM | POA: Diagnosis not present

## 2024-10-04 LAB — CBC WITH DIFFERENTIAL (CANCER CENTER ONLY)
Abs Immature Granulocytes: 0.04 K/uL (ref 0.00–0.07)
Basophils Absolute: 0 K/uL (ref 0.0–0.1)
Basophils Relative: 1 %
Eosinophils Absolute: 0 K/uL (ref 0.0–0.5)
Eosinophils Relative: 1 %
HCT: 22.6 % — ABNORMAL LOW (ref 39.0–52.0)
Hemoglobin: 7.3 g/dL — ABNORMAL LOW (ref 13.0–17.0)
Immature Granulocytes: 1 %
Lymphocytes Relative: 21 %
Lymphs Abs: 0.9 K/uL (ref 0.7–4.0)
MCH: 28.2 pg (ref 26.0–34.0)
MCHC: 32.3 g/dL (ref 30.0–36.0)
MCV: 87.3 fL (ref 80.0–100.0)
Monocytes Absolute: 0.5 K/uL (ref 0.1–1.0)
Monocytes Relative: 13 %
Neutro Abs: 2.8 K/uL (ref 1.7–7.7)
Neutrophils Relative %: 63 %
Platelet Count: 257 K/uL (ref 150–400)
RBC: 2.59 MIL/uL — ABNORMAL LOW (ref 4.22–5.81)
RDW: 17.2 % — ABNORMAL HIGH (ref 11.5–15.5)
WBC Count: 4.3 K/uL (ref 4.0–10.5)
nRBC: 0 % (ref 0.0–0.2)

## 2024-10-04 LAB — RAD ONC ARIA SESSION SUMMARY
Course Elapsed Days: 31
Plan Fractions Treated to Date: 6
Plan Prescribed Dose Per Fraction: 3 Gy
Plan Total Fractions Prescribed: 10
Plan Total Prescribed Dose: 30 Gy
Reference Point Dosage Given to Date: 18 Gy
Reference Point Session Dosage Given: 3 Gy
Session Number: 17

## 2024-10-04 LAB — CMP (CANCER CENTER ONLY)
ALT: 9 U/L (ref 0–44)
AST: 25 U/L (ref 15–41)
Albumin: 4 g/dL (ref 3.5–5.0)
Alkaline Phosphatase: 68 U/L (ref 38–126)
Anion gap: 11 (ref 5–15)
BUN: 13 mg/dL (ref 6–20)
CO2: 24 mmol/L (ref 22–32)
Calcium: 9.4 mg/dL (ref 8.9–10.3)
Chloride: 102 mmol/L (ref 98–111)
Creatinine: 1.73 mg/dL — ABNORMAL HIGH (ref 0.61–1.24)
GFR, Estimated: 45 mL/min — ABNORMAL LOW (ref >60)
Glucose, Bld: 96 mg/dL (ref 70–99)
Potassium: 3.4 mmol/L — ABNORMAL LOW (ref 3.5–5.1)
Sodium: 137 mmol/L (ref 135–145)
Total Bilirubin: 0.2 mg/dL (ref 0.0–1.2)
Total Protein: 8.5 g/dL — ABNORMAL HIGH (ref 6.5–8.1)

## 2024-10-04 LAB — SAMPLE TO BLOOD BANK

## 2024-10-04 LAB — PREPARE RBC (CROSSMATCH)

## 2024-10-04 MED ORDER — MIRTAZAPINE 15 MG PO TABS: 15.0000 mg | ORAL_TABLET | Freq: Every day | ORAL | 2 refills | Status: DC

## 2024-10-04 MED ORDER — DEXAMETHASONE 4 MG PO TABS: ORAL_TABLET | ORAL | 2 refills | Status: AC

## 2024-10-04 MED ORDER — OXYCODONE-ACETAMINOPHEN 5-325 MG PO TABS: 1.0000 | ORAL_TABLET | Freq: Four times a day (QID) | ORAL | 0 refills | Status: DC | PRN

## 2024-10-04 MED ORDER — SODIUM CHLORIDE 0.9 % IV SOLN: Freq: Once | INTRAVENOUS | Status: AC

## 2024-10-04 NOTE — Patient Instructions (Signed)
-  We covered a lot of important information at your appointment today regarding what the treatment plan is moving forward. Here are the the main points that were discussed at your office visit with us  today:  -The treatment that you will receive consists of two chemotherapy drugs, called Docetaxel and Cyramza (Ramucirumab) -We are planning on starting your treatment next week on _/_/_  -Your treatment will be given once every 3 weeks. We will check your labs once a week just to make sure that important components of your blood are in an acceptable range -You will receive an injection about 3 days after chemotherapy. The purpose of this injection is to boost your bodies infection fighting cells to protect you from getting an infection.  -We will get a CT scan after 3 treatments to check on the progress of treatment  Medications:  -I have sent a few important medication prescriptions to your pharmacy.  -Compazine  was sent to your pharmacy. This medication is for nausea. You may take this every 6 hours as needed if you feel nausous.  -I have sent a prescription for Decadron  (Dexamethasone ) to your pharmacy. This medication is a steroids. The purpose of this medication is because it is one if the pre-medications that is needed before you get treatment. You will need to take Two Tablets Twice a day (for a total of 4 tablets a day) the day before, the day of, and the day after chemotherapy. This means that you will take 12 tablets total over those 3 consecutive days.   Molecular Studies   Imaging Studies:   Referrals:   Side effects: The side effects of treatment may include but are not limited to alopecia (losing your hair), myelosuppression (dropping your blood counts), nausea and vomiting, peripheral neuropathy (numbness in the hands and feet), liver or renal dysfunction as well as the adverse effect of Cyramza including increased risk for hemorrhage (bleeding) and GI perforation. Please seek medical  attention if you develop concerning or significant bleeding.   Follow up:  -We will see you back for a follow up visit in __ weeks.

## 2024-10-04 NOTE — Patient Instructions (Signed)

## 2024-10-04 NOTE — Progress Notes (Signed)
 Spoke with Ashna in the blood bank to confirm orders for 2 units of blood.

## 2024-10-04 NOTE — Progress Notes (Signed)
 Palliative Medicine [facility]  Telephone:([phone]) [phone] Fax:([phone]) [phone]   Name: Benjamin Herrera Date: 10/04/2024 MRN: [id number]  DOB: [birth year]  Patient Care Team: [name], MD as PCP - General (Family Medicine) Eben Reyes [name], MD as PCP - Infectious Diseases (Infectious Diseases) Mona Vinie [name], MD as PCP - Cardiology (Cardiology) Debrah Lamar [name], MD (Inactive) as Consulting Physician (Gastroenterology) [name], MD as Consulting Physician (General Surgery) [name], RN as Oncology Nurse Navigator    INTERVAL HISTORY: Benjamin Herrera is a 58 y.o. male with medical problems include: HIV, hypertension, coronary artery disease (hx MI, status post DES to OM1 in 2017), chronic low back pain, SCC of skin, OSA, polysubstance abuse, GERD, asthma, anxiety, arthritis, more recently he was diagnosed with Adenocarcinoma of right lung, stage 4.   SOCIAL HISTORY:     reports that he has quit smoking. His smoking use included cigarettes. He started smoking about 42 years ago. He has a 42.9 pack-year smoking history. He has never used smokeless tobacco. He reports that he does not currently use drugs after having used the following drugs: Marijuana. Frequency: 3.00 times per week. He reports that he does not drink alcohol.  ADVANCE DIRECTIVES:  Full code full scope. Plans to meet with social work to establish advance directives. Referral to social work for this placed at initial visit. Pending completion of forms.   CODE STATUS: Full code  PAST MEDICAL HISTORY: Past Medical History:  Diagnosis Date   Achalasia and cardiospasm    Allergy    Anxiety    Arthritis    back ,shoulders    Asthma    Boils    under arms hx of   CAD (coronary artery disease)    a. 09/2016: cath showing 100% RCA occlusion with collaterals and 90% OM1 (treated with DES)   Candidiasis of mouth    Candidiasis of unspecified site    Cholelithiasis    Cocaine abuse  (HCC)    Quit 09/2016   Condyloma acuminatum in male of scrotum & anal canal s/p laser ablation 07/03/2012   Coronary artery disease    Dysphagia    Fracture, humerus    Frozen shoulder    left   GERD (gastroesophageal reflux disease)    Heart attack (HCC)    Hemorrhoids    Hepatitis 1993   A   Herpes labialis    HIV (human immunodeficiency virus infection) (HCC) DX 1993   Hypertension Dx 2014   off all bp meds for last 9 or 10 months   Pilonidal disease 01/10/2012   Polysubstance abuse (HCC)    Psoriasis    hx of   Sleep apnea    does  have CPAP   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  MEDICATIONS:  Current Outpatient Medications  Medication Sig Dispense Refill   acetaminophen  (TYLENOL ) 325 MG tablet Take 650 mg by mouth every 6 (six) hours as needed for mild pain (pain score 1-3), moderate pain (pain score 4-6) or headache.     albuterol  (VENTOLIN  HFA) 108 (90 Base) MCG/ACT inhaler Inhale 2 puffs into the lungs every 6 (six) hours as needed for wheezing or shortness of breath. 1 each 2   amLODipine  (NORVASC ) 5 MG tablet Take 1 tablet (5 mg total) by mouth daily. (Patient  not taking: Reported on 09/10/2024) 90 tablet 1   amoxicillin -clavulanate (AUGMENTIN ) 875-125 MG tablet Take 1 tablet by mouth 2 (two) times daily. 20 tablet 0   aspirin  81 MG chewable tablet Chew 1 tablet (81 mg total) by mouth daily. 90 tablet 3   Bictegravir-Emtricitab-Tenofov (BIKTARVY ) 30-120-15 MG TABS Take 1 tablet by mouth daily. 90 tablet 3   Blood Pressure KIT 1 kit by Does not apply route daily. 1 kit 0   diclofenac  Sodium (VOLTAREN  ARTHRITIS PAIN) 1 % GEL Apply 2 g topically 4 (four) times daily. 100 g 3   folic acid  (FOLVITE ) 1 MG tablet Take 1 tablet (1 mg total) by mouth daily. Start 7 days before pemetrexed  chemotherapy. Continue until  21 days after pemetrexed  completed. 30 tablet 3   hydrOXYzine  (ATARAX ) 25 MG tablet Take 1 tablet (25 mg total) by mouth 3 (three) times daily as needed. 30 tablet 0   lidocaine  (XYLOCAINE ) 2 % solution SWISH AND SWALLOW 5 MLS 3 (THREE) TIMES DAILY AS NEEDED FOR MOUTH PAIN. 360 mL 1   lidocaine -prilocaine  (EMLA ) cream Apply to affected area once 30 g 3   LORazepam  (ATIVAN ) 0.5 MG tablet TAKE 1 TAB BY MOUTH 30 MINUTES PRIOR TO RADIATION OR MRI SCANS 4 tablet 0   magic mouthwash (nystatin , diphenhydrAMINE , alum & mag hydroxide) suspension mixture Take 5 mLs by mouth 3 (three) times daily as needed for mouth pain. 210 mL 1   methocarbamol  (ROBAXIN ) 500 MG tablet Take 2 tablets (1,000 mg total) by mouth every 8 (eight) hours as needed. 90 tablet 1   metoprolol  succinate (TOPROL -XL) 25 MG 24 hr tablet TAKE 1/2 TABLET BY MOUTH DAILY 45 tablet 0   mirtazapine  (REMERON ) 15 MG tablet Take 1 tablet (15 mg total) by mouth at bedtime. 30 tablet 2   nitroGLYCERIN  (NITROSTAT ) 0.4 MG SL tablet Place 1 tablet (0.4 mg total) under the tongue every 5 (five) minutes as needed for chest pain (trouble swallowing). 25 tablet 1   nystatin  (MYCOSTATIN ) 100000 UNIT/ML suspension Take by mouth.     ondansetron  (ZOFRAN ) 8 MG tablet Take 1 tablet (8 mg total) by mouth every 8 (eight) hours as needed for nausea or vomiting. Start on the third day after carboplatin . 30 tablet 1   oxyCODONE -acetaminophen  (PERCOCET/ROXICET) 5-325 MG tablet Take 1 tablet by mouth every 8 (eight) hours as needed for severe pain (pain score 7-10). 45 tablet 0   potassium chloride  SA (KLOR-CON  M) 20 MEQ tablet Take 1 tablet (20 mEq total) by mouth daily. 6 tablet 0   predniSONE  (DELTASONE ) 50 MG tablet Take one tablet 13 hours before , one tablet 7 hours before and one tablet one hour prior to CT scan. Take benadryl  50 mg one hour prior to scan 3 tablet 0   prochlorperazine  (COMPAZINE ) 10 MG tablet Take 1 tablet (10 mg total) by mouth every 6 (six)  hours as needed for nausea or vomiting. 30 tablet 1   sulfamethoxazole -trimethoprim  (BACTRIM  DS) 800-160 MG tablet Take 1 tablet by mouth 3 (three) times a week. 36 tablet 3   No current facility-administered medications for this visit.    VITAL SIGNS: There were no vitals taken for this visit. There were no vitals filed for this visit.  Estimated body mass index is 23.09 kg/m as calculated from the following:   Height as of an earlier encounter on 10/04/24: 6' 1 (1.854 m).   Weight as of an earlier encounter on 10/04/24: 175 lb (79.4 kg).  PERFORMANCE STATUS (  ECOG) : 1 - Symptomatic but completely ambulatory  Physical Exam General: NAD Cardiovascular: regular rate and rhythm Pulmonary: normal breathing pattern Extremities: no edema, no joint deformities Skin: no rashes Neurological: AAO x3  IMPRESSION: Discussed the use of AI scribe software for clinical note transcription with the patient, who gave verbal consent to proceed.  History of Present Illness Benjamin Herrera is a 58 year old male who I saw during infusion for symptom management follow-up.  No family present.  No issues with nausea, vomiting, constipation or diarrhea.  Appetite fluctuates, some days are better than others. States when he is having pain he has minimal appetite. Ongoing fatigue however tries to remain active.   States plans for back kyphoplasty on 10/11/24 and pelvic procedures. Currently finishing up radiation to hip and back with last treatment scheduled for 10/10/24.   He has been experiencing neck pain that radiates to his back. Also pain in shoulder. Reports pain is inconsistent. Some days are better than others however recently feels pain has been more significant. Reports pain is manageable with use of his Percocet which he typically takes 2-3 times daily as needed. Will refill and continue to follow.   Mr. Cocuzza becomes tearful and emotional expressing follow-up today with his oncology team who  informed him that his cancer has progressed based on recent imaging. Emotional support provided.  I created space and opportunity for patient to express his thoughts and feelings.  Corney is tearful stating he does not wish to give up and remains hopeful for continued life but knows that his time is limited.  He states that he wishes to prolong his life for as long as he can but also has no desire to suffer.  He is open to continuing further treatment for as long as he can.   We will continue to closely monitor and support.    Assessment & Plan Chronic arm/Back pain  Chronic arm pain persists despite regimen, with pain radiating from the top of the arm to the elbow. The pain is exacerbated by movement and occasionally causes involuntary arm movements when lying down. He reports some improvement in movement but continues to experience discomfort. - Continue oxycodone  as needed.  - UDS every other visit. -Continue to monitor pain levels and adjust regimen as indicated  Back pain Pain management is ongoing with medication. - Scheduled for Kyphoplasty on 12/18.   Constipation and diarrhea Alternating constipation and diarrhea. Currently taking a stool softener every other day, resulting in bowel movements approximately daily. Goal is to prevent more than two days without a bowel movement. - Increase stool softener to one pill daily - If bowel movements become regular, adjust to one pill every other day - Monitor bowel movement frequency and adjust stool softener dosage as needed  Goals of Care Patient emotional expressing he was told today that his cancer is progressing and coming to limited treatment options.  States his wishes are to continue to treat the treatable allow him every opportunity to continue to live for as long as he can as he is not ready to give up at this time.  He is open to pursuing treatments with understanding they are what palliative life-prolonging intent. - Will continue  to closely monitor and support offered emotional comfort. - Ongoing goals of care discussions. -Will connect patient with social work support.   I will plan to see patient back in 2-3 weeks. Sooner if needed.    Patient expressed understanding and was in  agreement with this plan. He also understands that He can call the clinic at any time with any questions, concerns, or complaints.   Any controlled substances utilized were prescribed in the context of palliative care. PDMP has been reviewed.   Visit consisted of counseling and education dealing with the complex and emotionally intense issues of symptom management and palliative care in the setting of serious and potentially life-threatening illness.  Levon Borer, AGPCNP-BC  Palliative Medicine Team/[facility]

## 2024-10-05 ENCOUNTER — Ambulatory Visit

## 2024-10-05 ENCOUNTER — Encounter: Payer: Self-pay | Admitting: Physician Assistant

## 2024-10-05 ENCOUNTER — Ambulatory Visit: Admission: RE | Admit: 2024-10-05 | Discharge: 2024-10-05 | Attending: Radiation Oncology

## 2024-10-05 ENCOUNTER — Encounter: Payer: Self-pay | Admitting: Internal Medicine

## 2024-10-05 ENCOUNTER — Other Ambulatory Visit: Payer: Self-pay

## 2024-10-05 DIAGNOSIS — Z5112 Encounter for antineoplastic immunotherapy: Secondary | ICD-10-CM | POA: Diagnosis not present

## 2024-10-05 LAB — RAD ONC ARIA SESSION SUMMARY
Course Elapsed Days: 32
Plan Fractions Treated to Date: 7
Plan Prescribed Dose Per Fraction: 3 Gy
Plan Total Fractions Prescribed: 10
Plan Total Prescribed Dose: 30 Gy
Reference Point Dosage Given to Date: 21 Gy
Reference Point Session Dosage Given: 3 Gy
Session Number: 18

## 2024-10-05 NOTE — Progress Notes (Signed)
 DISCONTINUE OFF PATHWAY REGIMEN - Non-Small Cell Lung   [id number]:Cemiplimab  350 mg IV D1 + Carboplatin  AUC=5 IV D1 + Pemetrexed  500 mg/m2 IV D1 q21 Days x 4 Cycles:   A cycle is every 21 days:     Pemetrexed       Carboplatin       Cemiplimab -rwlc   **Always confirm dose/schedule in your pharmacy ordering system**  PRIOR TREATMENT: Off Pathway: Cemiplimab  350 mg IV D1 + Carboplatin  AUC=5 IV D1 + Pemetrexed  500 mg/m2 IV D1 q21 Days x 4 Cycles  START ON PATHWAY REGIMEN - Non-Small Cell Lung     A cycle is every 21 days:     Ramucirumab      Docetaxel   **Always confirm dose/schedule in your pharmacy ordering system**  Patient Characteristics: Stage IV Metastatic, Nonsquamous, Molecular Analysis Completed, Molecular Alteration Present and Targeted Therapy Exhausted, OR EGFR/KRAS/HER2/NRG1/c-Met Present and Standard Chemotherapy/Immunotherapy Planned, OR No Alteration Present, Second Line -  Chemotherapy/Immunotherapy, PS = 0, 1, No Prior PD-1/PD-L1  Inhibitor or Prior PD-1/PD-L1 Inhibitor + Chemotherapy, and Not a Candidate for Immunotherapy Therapeutic Status: Stage IV Metastatic Histology: Nonsquamous Cell Broad Molecular Profiling Status: Animal Nutritionist Analysis Results: No Alteration Present Chemotherapy/Immunotherapy Line of Therapy: Second Line Chemotherapy/Immunotherapy ECOG Performance Status: 1 Prior Immunotherapy Status: Prior PD-1/PD-L1 Inhibitor + Chemotherapy Immunotherapy Candidate Status: Not a Candidate for Immunotherapy Intent of Therapy: Non-Curative / Palliative Intent, Discussed with Patient

## 2024-10-06 ENCOUNTER — Inpatient Hospital Stay

## 2024-10-06 DIAGNOSIS — D649 Anemia, unspecified: Secondary | ICD-10-CM

## 2024-10-06 DIAGNOSIS — N3941 Urge incontinence: Secondary | ICD-10-CM | POA: Diagnosis not present

## 2024-10-06 DIAGNOSIS — C3490 Malignant neoplasm of unspecified part of unspecified bronchus or lung: Secondary | ICD-10-CM | POA: Diagnosis not present

## 2024-10-06 DIAGNOSIS — Z5112 Encounter for antineoplastic immunotherapy: Secondary | ICD-10-CM | POA: Diagnosis not present

## 2024-10-06 MED ORDER — ACETAMINOPHEN 325 MG PO TABS
650.0000 mg | ORAL_TABLET | Freq: Once | ORAL | Status: AC
  Administered 2024-10-06: 650 mg via ORAL
  Filled 2024-10-06: qty 2

## 2024-10-06 MED ORDER — DIPHENHYDRAMINE HCL 25 MG PO CAPS
25.0000 mg | ORAL_CAPSULE | Freq: Once | ORAL | Status: AC
  Administered 2024-10-06: 25 mg via ORAL
  Filled 2024-10-06: qty 1

## 2024-10-06 MED ORDER — SODIUM CHLORIDE 0.9% IV SOLUTION: 250.0000 mL | Freq: Once | INTRAVENOUS | Status: DC

## 2024-10-06 NOTE — Patient Instructions (Signed)

## 2024-10-08 ENCOUNTER — Ambulatory Visit: Admission: RE | Admit: 2024-10-08 | Discharge: 2024-10-08 | Attending: Radiation Oncology

## 2024-10-08 ENCOUNTER — Other Ambulatory Visit: Payer: Self-pay

## 2024-10-08 ENCOUNTER — Ambulatory Visit

## 2024-10-08 DIAGNOSIS — R6889 Other general symptoms and signs: Secondary | ICD-10-CM | POA: Diagnosis not present

## 2024-10-08 DIAGNOSIS — Z5112 Encounter for antineoplastic immunotherapy: Secondary | ICD-10-CM | POA: Diagnosis not present

## 2024-10-08 LAB — BPAM RBC
Blood Product Expiration Date: 202601012359
Blood Product Unit Number: 202512282359
ISSUE DATE / TIME: 202512130845
PRODUCT CODE: 202512130845
PRODUCT CODE: 202601012359
Unit Type and Rh: 1700
Unit Type and Rh: 1700
Unit Type and Rh: 1700
Unit Type and Rh: 202512282359

## 2024-10-08 LAB — RAD ONC ARIA SESSION SUMMARY
Course Elapsed Days: 35
Plan Fractions Treated to Date: 8
Plan Prescribed Dose Per Fraction: 3 Gy
Plan Total Fractions Prescribed: 10
Plan Total Prescribed Dose: 30 Gy
Reference Point Dosage Given to Date: 24 Gy
Reference Point Session Dosage Given: 3 Gy
Session Number: 19

## 2024-10-08 LAB — TYPE AND SCREEN
ABO/RH(D): B NEG
Antibody Screen: NEGATIVE
Unit division: 0
Unit division: 0

## 2024-10-09 ENCOUNTER — Ambulatory Visit

## 2024-10-09 ENCOUNTER — Ambulatory Visit: Admission: RE | Admit: 2024-10-09 | Discharge: 2024-10-09 | Attending: Radiation Oncology

## 2024-10-09 ENCOUNTER — Other Ambulatory Visit: Payer: Self-pay

## 2024-10-09 ENCOUNTER — Ambulatory Visit: Payer: Self-pay | Admitting: Family Medicine

## 2024-10-09 DIAGNOSIS — R6889 Other general symptoms and signs: Secondary | ICD-10-CM | POA: Diagnosis not present

## 2024-10-09 DIAGNOSIS — C801 Malignant (primary) neoplasm, unspecified: Secondary | ICD-10-CM | POA: Diagnosis not present

## 2024-10-09 DIAGNOSIS — C7951 Secondary malignant neoplasm of bone: Secondary | ICD-10-CM | POA: Diagnosis not present

## 2024-10-09 DIAGNOSIS — Z5112 Encounter for antineoplastic immunotherapy: Secondary | ICD-10-CM | POA: Diagnosis not present

## 2024-10-09 LAB — RAD ONC ARIA SESSION SUMMARY
Course Elapsed Days: 36
Plan Fractions Treated to Date: 9
Plan Prescribed Dose Per Fraction: 3 Gy
Plan Total Fractions Prescribed: 10
Plan Total Prescribed Dose: 30 Gy
Reference Point Dosage Given to Date: 27 Gy
Reference Point Session Dosage Given: 3 Gy
Session Number: 20

## 2024-10-10 ENCOUNTER — Other Ambulatory Visit: Payer: Self-pay | Admitting: Physician Assistant

## 2024-10-10 ENCOUNTER — Ambulatory Visit
Admission: RE | Admit: 2024-10-10 | Discharge: 2024-10-10 | Disposition: A | Discharge status: Home / Self Care | Source: Ambulatory Visit | Attending: Radiation Oncology | Admitting: Radiation Oncology

## 2024-10-10 ENCOUNTER — Other Ambulatory Visit: Payer: Self-pay

## 2024-10-10 ENCOUNTER — Encounter: Payer: Self-pay | Admitting: Internal Medicine

## 2024-10-10 ENCOUNTER — Ambulatory Visit: Admission: RE | Admit: 2024-10-10 | Discharge: 2024-10-10 | Attending: Radiation Oncology

## 2024-10-10 DIAGNOSIS — C3411 Malignant neoplasm of upper lobe, right bronchus or lung: Secondary | ICD-10-CM | POA: Diagnosis not present

## 2024-10-10 DIAGNOSIS — Z87891 Personal history of nicotine dependence: Secondary | ICD-10-CM | POA: Diagnosis not present

## 2024-10-10 DIAGNOSIS — Z510 Encounter for antineoplastic radiation therapy: Secondary | ICD-10-CM | POA: Diagnosis not present

## 2024-10-10 DIAGNOSIS — C7951 Secondary malignant neoplasm of bone: Secondary | ICD-10-CM | POA: Diagnosis not present

## 2024-10-10 DIAGNOSIS — C3491 Malignant neoplasm of unspecified part of right bronchus or lung: Secondary | ICD-10-CM

## 2024-10-10 DIAGNOSIS — Z5112 Encounter for antineoplastic immunotherapy: Secondary | ICD-10-CM | POA: Diagnosis not present

## 2024-10-10 LAB — RAD ONC ARIA SESSION SUMMARY
Course Elapsed Days: 37
Plan Fractions Treated to Date: 10
Plan Prescribed Dose Per Fraction: 3 Gy
Plan Total Fractions Prescribed: 10
Plan Total Prescribed Dose: 30 Gy
Reference Point Dosage Given to Date: 30 Gy
Reference Point Session Dosage Given: 3 Gy
Session Number: 21

## 2024-10-11 ENCOUNTER — Inpatient Hospital Stay: Admitting: Licensed Clinical Social Worker

## 2024-10-11 DIAGNOSIS — R6889 Other general symptoms and signs: Secondary | ICD-10-CM | POA: Diagnosis not present

## 2024-10-11 DIAGNOSIS — C7951 Secondary malignant neoplasm of bone: Secondary | ICD-10-CM | POA: Diagnosis not present

## 2024-10-11 DIAGNOSIS — M25512 Pain in left shoulder: Secondary | ICD-10-CM | POA: Diagnosis not present

## 2024-10-11 DIAGNOSIS — F17200 Nicotine dependence, unspecified, uncomplicated: Secondary | ICD-10-CM | POA: Diagnosis not present

## 2024-10-11 DIAGNOSIS — C3491 Malignant neoplasm of unspecified part of right bronchus or lung: Secondary | ICD-10-CM

## 2024-10-11 DIAGNOSIS — G8929 Other chronic pain: Secondary | ICD-10-CM | POA: Diagnosis not present

## 2024-10-11 DIAGNOSIS — Z923 Personal history of irradiation: Secondary | ICD-10-CM | POA: Diagnosis not present

## 2024-10-11 NOTE — Progress Notes (Signed)
 [facility] CSW Progress Note    Interventions: CSW had phone counseling appt scheduled w/ pt.  Pt did not answer at scheduled time.  Pt noted to also have an orthopedic appt at [facility] 30 minutes prior.  VM left for pt to reschedule.  CSW to f/u w/ pt at next infusion on 1/5.        Follow Up Plan:  Patient will contact CSW with any support or resource needs    Benjamin JONELLE Manna, LCSW Clinical Social Worker [facility]    Patient is participating in a Managed Medicaid Plan:  Yes

## 2024-10-11 NOTE — Radiation Completion Notes (Addendum)
" °  Radiation Oncology         [phone] ________________________________  Name: Benjamin Herrera MRN: [id number]  Date of Service: 10/10/2024  DOB: 1966/02/25  End of Treatment Note   Diagnosis: Progressive  Stage IV, cT2bN3M1c, NSCLC, adenocarcinoma of the RUL   Intent: Palliative     ==========DELIVERED PLANS==========  First Treatment Date: 2024-09-03 Last Treatment Date: 2024-10-10   Plan Name: Spine_L Site: Lumbar Spine Technique: 3D Mode: Photon Dose Per Fraction: 3 Gy Prescribed Dose (Delivered / Prescribed): 30 Gy / 30 Gy Prescribed Fxs (Delivered / Prescribed): 10 / 10   Plan Name: Brain_SRS Site: Brain [id number] Technique: SBRT/SRT-IMRT Mode: Photon Dose Per Fraction: 20 Gy Prescribed Dose (Delivered / Prescribed): 20 Gy / 20 Gy Prescribed Fxs (Delivered / Prescribed): 1 / 1   Plan Name: Pelvis_L Site: [place], Left Technique: 3D Mode: Photon Dose Per Fraction: 3 Gy Prescribed Dose (Delivered / Prescribed): 30 Gy / 30 Gy Prescribed Fxs (Delivered / Prescribed): 10 / 10     ==========ON TREATMENT VISIT DATES========== 2024-09-10, 2024-09-13, 2024-09-13, 2024-10-04, 2024-10-10     See weekly On Treatment Notes in Epic for details in the Media tab (listed as Progress notes on the On Treatment Visit Dates listed above).The patient tolerated radiation. He developed fatigue but had improvement in his pain at the conclusion of treatment.  The patient will receive a call in about one month from the radiation oncology department. He will continue follow up with Dr. Sherrod as well as in our brain oncology program.     Donald KYM Husband, [facility]     "

## 2024-10-15 ENCOUNTER — Other Ambulatory Visit: Payer: Self-pay | Admitting: Radiology

## 2024-10-15 DIAGNOSIS — S32020A Wedge compression fracture of second lumbar vertebra, initial encounter for closed fracture: Secondary | ICD-10-CM

## 2024-10-15 DIAGNOSIS — R1319 Other dysphagia: Secondary | ICD-10-CM | POA: Diagnosis not present

## 2024-10-15 DIAGNOSIS — C3491 Malignant neoplasm of unspecified part of right bronchus or lung: Secondary | ICD-10-CM | POA: Diagnosis not present

## 2024-10-15 NOTE — H&P (Incomplete)
 "  Chief Complaint: Back pain, metastatic lung cancer, L 2 pathologic compression fracture; referred for L2 Percutaneous RFA Osteocool and Vertebral Augmentation with Balloon Kyphoplasty   Referring Provider(s): Mohamed,M  Supervising Physician: Hughes Simmonds  Patient Status: [facility] - Out-pt  History of Present Illness: Benjamin Herrera is a 58 y.o. male with past medical history significant for achalasia/cardia spasm, anxiety, arthritis, asthma, coronary artery disease with prior MI/stenting, cholelithiasis, prior substance abuse, GERD, hepatitis A, HIV, hypertension, psoriasis, sleep apnea, skin cancer and metastatic lung cancer. Recent imaging has revealed multifocal osseous hypermetabolic lesions, specifically within his spine at L2 vertebral body and at L sacrum. Worsening back pain w MR L spine (08/31/24) confirming a pathologic fracture at L2 w 20% height loss, and MR Pelvis (09/12/24) w significant edema and progression at L sacrum since the PET CT. Pt completed palliative radiation to his lumbar spine (10 fractions, total 30 Gy dose).He recently underwent consultation with Dr. Hughes to discuss  palliative therapy with percutaneous ablation and stabilization with cement augmentation and presents today for L2 percutaneous RFA/osteo cool and vertebral augmentation with balloon kyphoplasty.  *** Patient is Full Code  Past Medical History:  Diagnosis Date   Achalasia and cardiospasm    Allergy    Anxiety    Arthritis    back ,shoulders    Asthma    Boils    under arms hx of   CAD (coronary artery disease)    a. 09/2016: cath showing 100% RCA occlusion with collaterals and 90% OM1 (treated with DES)   Candidiasis of mouth    Candidiasis of unspecified site    Cholelithiasis    Cocaine abuse (HCC)    Quit 09/2016   Condyloma acuminatum in male of scrotum & anal canal s/p laser ablation 07/03/2012   Coronary artery disease    Dysphagia    Fracture, humerus    Frozen shoulder     left   GERD (gastroesophageal reflux disease)    Heart attack (HCC)    Hemorrhoids    Hepatitis 1993   A   Herpes labialis    HIV (human immunodeficiency virus infection) (HCC) DX 1993   Hypertension Dx 2014   off all bp meds for last 9 or 10 months   Pilonidal disease 01/10/2012   Polysubstance abuse (HCC)    Psoriasis    hx of   Sleep apnea    does  have CPAP   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/03/2012   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 08/03/2012    Past Surgical History:  Procedure Laterality Date   BALLOON DILATION N/A 11/11/2015   Procedure: BALLOON DILATION;  Surgeon: Gustav Shila GAILS, MD;  Location: [facility];  Service: Endoscopy;  Laterality: N/A;   BOTOX  INJECTION  10/11/2012   Procedure: BOTOX  INJECTION;  Surgeon: Lamar JONETTA Aho, MD;  Location: [facility];  Service: Endoscopy;  Laterality: N/A;   BOTOX  INJECTION N/A 01/25/2013   Procedure: BOTOX  INJECTION;  Surgeon: Lamar JONETTA Aho, MD;  Location: [facility];  Service: Endoscopy;  Laterality: N/A;   BOTOX  INJECTION N/A 06/21/2013   Procedure: BOTOX  INJECTION;  Surgeon: Lamar JONETTA Aho, MD;  Location: [facility];  Service: Endoscopy;  Laterality: N/A;   BOTOX  INJECTION N/A 10/08/2013   Procedure: BOTOX  INJECTION;  Surgeon: Lamar JONETTA Aho, MD;  Location: [facility];  Service: Endoscopy;  Laterality: N/A;   BOTOX  INJECTION N/A 06/16/2015   Procedure: BOTOX   INJECTION;  Surgeon: Lamar JONETTA Aho, MD;  Location: [facility];  Service: Endoscopy;  Laterality: N/A;   BRONCHIAL BIOPSY  04/03/2024   Procedure: BRONCHOSCOPY, WITH BIOPSY;  Surgeon: Shelah Lamar RAMAN, MD;  Location: [facility];  Service: Pulmonary;;   BRONCHIAL BRUSHINGS  04/03/2024   Procedure: BRONCHOSCOPY, WITH BRUSH BIOPSY;  Surgeon: Shelah Lamar RAMAN, MD;  Location: [facility];  Service: Pulmonary;;   BRONCHIAL NEEDLE ASPIRATION BIOPSY  04/03/2024   Procedure: BRONCHOSCOPY, WITH  NEEDLE ASPIRATION BIOPSY;  Surgeon: Shelah Lamar RAMAN, MD;  Location: [facility];  Service: Pulmonary;;   BRONCHOSCOPY, WITH BIOPSY USING ELECTROMAGNETIC NAVIGATION Right 04/03/2024   Procedure: BRONCHOSCOPY, WITH BIOPSY USING ELECTROMAGNETIC NAVIGATION;  Surgeon: Shelah Lamar RAMAN, MD;  Location: [facility];  Service: Pulmonary;  Laterality: Right;   CARDIAC CATHETERIZATION N/A 10/11/2016   Procedure: Left Heart Cath and Coronary Angiography;  Surgeon: Lonni JONETTA Cash, MD;  mRCA 100% w/ L>R collaterals, OM1 90%   CARDIAC CATHETERIZATION N/A 10/11/2016   Procedure: Coronary Stent Intervention;  Surgeon: Lonni JONETTA Cash, MD;  RESOLUTE ONYX 3.5X15 DES OM1   COLONOSCOPY  05/19/2012   Procedure: COLONOSCOPY;  Surgeon: Lamar JONETTA Aho, MD;  Location: [facility];  Service: Endoscopy;  Laterality: N/A;  jill trying to contact pt to come in 0830 for 930 case, phone not accepting messages   COLONOSCOPY     ESOPHAGEAL MANOMETRY  05/29/2012   Procedure: ESOPHAGEAL MANOMETRY (EM);  Surgeon: Lamar JONETTA Aho, MD;  Location: [facility];  Service: Endoscopy;  Laterality: N/A;   ESOPHAGEAL MANOMETRY N/A 10/06/2015   Procedure: ESOPHAGEAL MANOMETRY (EM);  Surgeon: Gustav Shila GAILS, MD;  Location: [facility];  Service: Endoscopy;  Laterality: N/A;   ESOPHAGOGASTRODUODENOSCOPY  11/11/2011   Procedure: ESOPHAGOGASTRODUODENOSCOPY (EGD);  Surgeon: Lamar JONETTA Aho, MD;  Location: [facility];  Service: Endoscopy;  Laterality: N/A;  botox  injection  called Pt to change time of procedure per Dr Aho   ESOPHAGOGASTRODUODENOSCOPY  03/10/2012   Procedure: ESOPHAGOGASTRODUODENOSCOPY (EGD);  Surgeon: Lamar JONETTA Aho, MD;  Location: [facility];  Service: Endoscopy;  Laterality: N/A;   ESOPHAGOGASTRODUODENOSCOPY  10/11/2012   Procedure: ESOPHAGOGASTRODUODENOSCOPY (EGD);  Surgeon: Lamar JONETTA Aho, MD;  Location: [facility];  Service: Endoscopy;  Laterality: N/A;   ESOPHAGOGASTRODUODENOSCOPY N/A 01/25/2013    Procedure: ESOPHAGOGASTRODUODENOSCOPY (EGD);  Surgeon: Lamar JONETTA Aho, MD;  Location: [facility];  Service: Endoscopy;  Laterality: N/A;   ESOPHAGOGASTRODUODENOSCOPY N/A 06/21/2013   Procedure: ESOPHAGOGASTRODUODENOSCOPY (EGD);  Surgeon: Lamar JONETTA Aho, MD;  Location: [facility];  Service: Endoscopy;  Laterality: N/A;   ESOPHAGOGASTRODUODENOSCOPY (EGD) WITH PROPOFOL  N/A 10/08/2013   Procedure: ESOPHAGOGASTRODUODENOSCOPY (EGD) WITH PROPOFOL ;  Surgeon: Lamar JONETTA Aho, MD;  Location: [facility];  Service: Endoscopy;  Laterality: N/A;   ESOPHAGOGASTRODUODENOSCOPY (EGD) WITH PROPOFOL  N/A 06/16/2015   Procedure: ESOPHAGOGASTRODUODENOSCOPY (EGD) WITH PROPOFOL ;  Surgeon: Lamar JONETTA Aho, MD;  Location: [facility];  Service: Endoscopy;  Laterality: N/A;   ESOPHAGOGASTRODUODENOSCOPY (EGD) WITH PROPOFOL  N/A 11/11/2015   Procedure: ESOPHAGOGASTRODUODENOSCOPY (EGD) WITH PROPOFOL ;  Surgeon: Gustav Shila GAILS, MD;  Location: [facility];  Service: Endoscopy;  Laterality: N/A;   EXAMINATION UNDER ANESTHESIA  07/24/2012   Procedure: EXAM UNDER ANESTHESIA;  Surgeon: Elspeth KYM Schultze, MD;  Location: [facility];  Service: General;  Laterality: N/A;   humeral fracture surgery Right yrs ago   IR RADIOLOGIST EVAL & MGMT  09/12/2024   PILONIDAL CYST EXCISION  07/24/2012   Procedure: CYST EXCISION PILONIDAL SIMPLE;  Surgeon: Elspeth KYM Schultze, MD;  Location: [facility];  Service: General;  Laterality: N/A;  Exam Under Anesthesia,, Excision Pilonidal Disease,    right shoulder replacement  2008   x 2   UPPER GASTROINTESTINAL ENDOSCOPY     WART FULGURATION  07/24/2012   Procedure: FULGURATION ANAL WART;  Surgeon: Elspeth KYM Schultze, MD;  Location: [facility];  Service: General;  Laterality: N/A;  excision of raphe mass    Allergies: Citrus  Medications: Prior to Admission medications  Medication Sig Start Date End Date Taking? Authorizing Provider  acetaminophen  (TYLENOL ) 325 MG tablet Take 650 mg by mouth every 6 (six) hours  as needed for mild pain (pain score 1-3), moderate pain (pain score 4-6) or headache.    [provider]  albuterol  (VENTOLIN  HFA) 108 (90 Base) MCG/ACT inhaler Inhale 2 puffs into the lungs every 6 (six) hours as needed for wheezing or shortness of breath. 04/07/21   Eben Reyes BROCKS, MD  amLODipine  (NORVASC ) 5 MG tablet Take 1 tablet (5 mg total) by mouth daily. Patient not taking: Reported on 09/10/2024 05/31/24 08/29/24  Mona Vinie BROCKS, MD  amoxicillin -clavulanate (AUGMENTIN ) 875-125 MG tablet Take 1 tablet by mouth 2 (two) times daily. 05/10/24   Boscia, Heather E, NP  aspirin  81 MG chewable tablet Chew 1 tablet (81 mg total) by mouth daily. 10/28/16   Funches, Josalyn, MD  Bictegravir-Emtricitab-Tenofov (BIKTARVY ) 30-120-15 MG TABS Take 1 tablet by mouth daily. 05/09/24   Eben Reyes BROCKS, MD  Blood Pressure KIT 1 kit by Does not apply route daily. 03/23/23   Vannie Reche RAMAN, NP  dexamethasone  (DECADRON ) 4 MG tablet Take TWO tablets TWICE a day the day before chemotherapy, the day of chemotherapy, and the day after chemotherapy 10/04/24   Heilingoetter, Cassandra L, PA-C  diclofenac  Sodium (VOLTAREN  ARTHRITIS PAIN) 1 % GEL Apply 2 g topically 4 (four) times daily. 01/05/24   Danton Jon HERO, PA-C  hydrOXYzine  (ATARAX ) 25 MG tablet Take 1 tablet (25 mg total) by mouth 3 (three) times daily as needed. 05/09/24   Eben Reyes BROCKS, MD  lidocaine  (XYLOCAINE ) 2 % solution SWISH AND SWALLOW 5 MLS 3 (THREE) TIMES DAILY AS NEEDED FOR MOUTH PAIN. 05/09/24   Eben Reyes BROCKS, MD  LORazepam  (ATIVAN ) 0.5 MG tablet TAKE 1 TAB BY MOUTH 30 MINUTES PRIOR TO RADIATION OR MRI SCANS 09/10/24   [name], PA-C  magic mouthwash (nystatin , diphenhydrAMINE , alum & mag hydroxide) suspension mixture Take 5 mLs by mouth 3 (three) times daily as needed for mouth pain. 08/29/24   Eben Reyes BROCKS, MD  methocarbamol  (ROBAXIN ) 500 MG tablet Take 2 tablets (1,000 mg total) by mouth every 8 (eight) hours  as needed. 01/05/24   Danton Jon HERO, PA-C  metoprolol  succinate (TOPROL -XL) 25 MG 24 hr tablet TAKE 1/2 TABLET BY MOUTH DAILY 04/03/24   Hilty, Vinie BROCKS, MD  mirtazapine  (REMERON ) 15 MG tablet Take 1 tablet (15 mg total) by mouth at bedtime. 10/04/24   Heilingoetter, Cassandra L, PA-C  nitroGLYCERIN  (NITROSTAT ) 0.4 MG SL tablet Place 1 tablet (0.4 mg total) under the tongue every 5 (five) minutes as needed for chest pain (trouble swallowing). 01/02/24   Craig Alan SAUNDERS, PA-C  nystatin  (MYCOSTATIN ) 100000 UNIT/ML suspension Take by mouth. 06/01/24   [provider]  oxyCODONE -acetaminophen  (PERCOCET/ROXICET) 5-325 MG tablet Take 1 tablet by mouth every 6 (six) hours as needed for severe pain (pain score 7-10). 10/04/24   Pickenpack-Cousar, Athena N, NP  potassium chloride  SA (KLOR-CON  M) 20 MEQ tablet Take 1 tablet (20 mEq total) by mouth daily.  08/29/24   Heilingoetter, Cassandra L, PA-C  predniSONE  (DELTASONE ) 50 MG tablet Take one tablet 13 hours before , one tablet 7 hours before and one tablet one hour prior to CT scan. Take benadryl  50 mg one hour prior to scan 08/22/24   Sherrod Sherrod, MD  sulfamethoxazole -trimethoprim  (BACTRIM  DS) 800-160 MG tablet Take 1 tablet by mouth 3 (three) times a week. 06/20/24   Eben Reyes BROCKS, MD     Family History  Problem Relation Age of Onset   Arthritis Mother    Hypertension Mother    Heart disease Mother        MI age 45   Heart attack Sister    Diabetes Maternal Grandmother    Stomach cancer Paternal Grandmother    Colon cancer Neg Hx    Colon polyps Neg Hx    Esophageal cancer Neg Hx    Rectal cancer Neg Hx     Social History   Socioeconomic History   Marital status: Single    Spouse name: Not on file   Number of children: 0   Years of education: Not on file   Highest education level: Not on file  Occupational History   Occupation: Disabled  Tobacco Use   Smoking status: Former    Current packs/day: 1.00    Average  packs/day: 1 pack/day for 43.0 years (43.0 ttl pk-yrs)    Types: Cigarettes    Start date: 1983   Smokeless tobacco: Never   Tobacco comments:    4 or 5 cigarettes a day     States quit one week ago 04/02/2024  Vaping Use   Vaping status: Never Used  Substance and Sexual Activity   Alcohol use: No    Alcohol/week: 0.0 standard drinks of alcohol   Drug use: Not Currently    Frequency: 3.0 times per week    Types: Marijuana   Sexual activity: Not Currently    Partners: Male    Comment: pt. given condoms, used marijuana -3 days ago 05/14/16  Other Topics Concern   Not on file  Social History Narrative   Pt lives in a ground floor apartment in [place].   Social Drivers of Health   Tobacco Use: High Risk (10/11/2024)   Received from [facility]   Patient History    Smoking Tobacco Use: Every Day    Smokeless Tobacco Use: Never    Passive Exposure: Not on file  Financial Resource Strain: Low Risk (11/08/2023)   Overall Financial Resource Strain (CARDIA)    Difficulty of Paying Living Expenses: Not very hard  Food Insecurity: Food Insecurity Present (05/23/2024)   Epic    Worried About Programme Researcher, Broadcasting/film/video in the Last Year: Sometimes true    Ran Out of Food in the Last Year: Sometimes true  Transportation Needs: No Transportation Needs (04/16/2024)   Epic    Lack of Transportation (Medical): No    Lack of Transportation (Non-Medical): No  Physical Activity: Inactive (11/08/2023)   Exercise Vital Sign    Days of Exercise per Week: 0 days    Minutes of Exercise per Session: 0 min  Stress: No Stress Concern Present (11/08/2023)   [employer] of Occupational Health - Occupational Stress Questionnaire    Feeling of Stress : Not at all  Social Connections: Socially Isolated (11/08/2023)   Social Connection and Isolation Panel    Frequency of Communication with Friends and Family: Three times a week    Frequency of Social Gatherings with Friends and  Family: Three times a week     Attends Religious Services: Never    Active Member of Clubs or Organizations: No    Attends Banker Meetings: Never    Marital Status: Never married  Depression (PHQ2-9): Low Risk (07/25/2024)   Depression (PHQ2-9)    PHQ-2 Score: 0  Recent Concern: Depression (PHQ2-9) - High Risk (06/19/2024)   Depression (PHQ2-9)    PHQ-2 Score: 13  Alcohol Screen: Low Risk (11/08/2023)   Alcohol Screen    Last Alcohol Screening Score (AUDIT): 0  Housing: Unknown (04/16/2024)   Epic    Unable to Pay for Housing in the Last Year: No    Number of Times Moved in the Last Year: Not on file    Homeless in the Last Year: No  Utilities: Not At Risk (11/08/2023)   [facility] Utilities    Threatened with loss of utilities: No  Health Literacy: Adequate Health Literacy (11/08/2023)   B1300 Health Literacy    Frequency of need for help with medical instructions: Never       Review of Systems  Vital Signs:   Advance Care Plan: No documents on file  Physical Exam  Imaging: CT CHEST ABDOMEN PELVIS W CONTRAST Result Date: 09/24/2024 CLINICAL DATA:  Non-small cell lung cancer. Ongoing left pelvic pain. Shortness of breath and weight loss. Constipation. * Tracking Code: BO * EXAM: CT CHEST, ABDOMEN, AND PELVIS WITH CONTRAST TECHNIQUE: Multidetector CT imaging of the chest, abdomen and pelvis was performed following the standard protocol during bolus administration of intravenous contrast. RADIATION DOSE REDUCTION: This exam was performed according to the departmental dose-optimization program which includes automated exposure control, adjustment of the mA and/or kV according to patient size and/or use of iterative reconstruction technique. CONTRAST:  OMNIPAQUE  IOHEXOL  300 MG/ML  SOLN COMPARISON:  07/09/2024 and PET 04/09/2024. FINDINGS: CT CHEST FINDINGS Cardiovascular: Atherosclerotic calcification of the aorta with age advanced involvement of the left anterior descending and circumflex coronary  arteries. Heart size normal. No pericardial effusion. Mediastinum/Nodes: Right low internal jugular and supraclavicular lymph nodes appear to have increased in size and number. 9 mm index lymph node (2/6), previously 7 mm. 7 mm right supraclavicular lymph node (2/6) appears new. No pathologically enlarged mediastinal or axillary lymph nodes. Hilar regions are difficult to definitively evaluate without IV contrast. Air and fluid are seen in a dilated esophagus. Mid and lower esophageal wall is minimally thickened, as before. Lungs/Pleura: Spiculated mass in the medial aspect of the apical segment right upper lobe measures approximately 2.4 x 3.4 cm (4/27), with invasion of the superior mediastinum, grossly stable in size when remeasured in similar fashion. Centrilobular and paraseptal emphysema. Question postoperative scarring in the posterior segment right upper lobe. New peribronchovascular consolidation and ground-glass in the medial segment right middle lobe. Small right pleural effusion. 4 mm anterior segment left upper lobe nodule (4/69), as before. No pleural fluid. Airway is unremarkable. Musculoskeletal: Right shoulder arthroplasty. Slight compression of the T8 superior endplate, old. Similar healing or healed pathologic left proximal humerus fracture. Additional osseous metastatic disease better visualized on PET 04/09/2024. CT ABDOMEN PELVIS FINDINGS Hepatobiliary: Liver is unremarkable. Gallstone. No biliary ductal dilatation. Pancreas: Negative. Spleen: Negative. Adrenals/Urinary Tract: Right adrenal gland is unremarkable. New lateral limb left adrenal nodule measures 1.7 x 2.3 cm. Renal cortical thinning and slight scarring bilaterally. Tiny low-attenuation lesion in the right kidney, too small to characterize. No specific follow-up necessary. Possible vague low density in the anterior interpolar left kidney (7/22), not  well seen on 07/09/2024. Ureters are decompressed. Bladder is relatively low in  volume. Right posterolateral bladder diverticulum. Stomach/Bowel: Stomach, small bowel, appendix and colon are unremarkable. Vascular/Lymphatic: Atherosclerotic calcification of the aorta. No pathologically enlarged lymph nodes. Inguinal lymph nodes are not enlarged by CT size criteria. Reproductive: Prostate is normal in size. Other: No free fluid.  Mesenteries and peritoneum are unremarkable. Musculoskeletal: Lucency and compression involving the L2 superior endplate, unchanged. Additional osseous metastatic disease, better seen on PET 04/09/2024. IMPRESSION: 1. New/enlarging low right internal jugular/right supraclavicular lymph nodes with a new left adrenal nodule, findings compatible with disease progression. 2. Spiculated right upper lobe mass is grossly stable in size. 3. Old pathologic proximal left humerus and L2 superior endplate fractures. Additional osseous metastatic disease, better visualized on PET 04/09/2024. 4. New peribronchovascular ground-glass and consolidation in the medial segment right upper lobe, likely infectious/inflammatory in etiology. 5. Esophageal dysmotility with similar mid and lower esophageal wall thickening, possibly due to radiation therapy. 6. Possible vague low-density in the anterior interpolar left kidney, not previously seen. Recommend attention on follow-up. 7.  Age advanced two vessel coronary artery calcification. 8.  Aortic atherosclerosis (ICD10-I70.0). Electronically Signed   By: Newell Eke M.D.   On: 09/24/2024 13:46    Labs:  CBC: Recent Labs    08/29/24 1514 09/03/24 1450 09/05/24 0904 10/04/24 1023  WBC 4.7 5.5 5.0 4.3  HGB 8.2* 8.6* 8.4* 7.3*  HCT 24.8* 26.3* 25.8* 22.6*  PLT 145* 164 147* 257    COAGS: No results for input(s): INR, APTT in the last 8760 hours.  BMP: Recent Labs    08/29/24 1514 09/03/24 1450 09/05/24 0904 10/04/24 1023  NA 140 140 139 137  K 3.2* 3.6 3.5 3.4*  CL 107 106 105 102  CO2 25 26 26 24   GLUCOSE  119* 82 86 96  BUN 16 13 17 13   CALCIUM  9.1 9.6 9.1 9.4  CREATININE 1.15 1.22 1.19 1.73*  GFRNONAA >60 >60 >60 45*    LIVER FUNCTION TESTS: Recent Labs    08/29/24 1514 09/03/24 1450 09/05/24 0904 10/04/24 1023  BILITOT 0.3 0.4 0.4 0.2  AST 19 18 18 25   ALT 7 7 6 9   ALKPHOS 55 60 58 68  PROT 7.5 7.9 7.9 8.5*  ALBUMIN 3.5 3.7 3.7 4.0    TUMOR MARKERS: No results for input(s): AFPTM, CEA, CA199, CHROMGRNA in the last 8760 hours.  Assessment and Plan: 58 y.o. male with past medical history significant for achalasia/cardia spasm, anxiety, arthritis, asthma, coronary artery disease with prior MI/stenting, cholelithiasis, prior substance abuse, GERD, hepatitis A, HIV, hypertension, psoriasis, sleep apnea, skin cancer and metastatic lung cancer. Recent imaging has revealed multifocal osseous hypermetabolic lesions, specifically within his spine at L2 vertebral body and at L sacrum. Worsening back pain w MR L spine (08/31/24) confirming a pathologic fracture at L2 w 20% height loss, and MR Pelvis (09/12/24) w significant edema and progression at L sacrum since the PET CT. Pt completed palliative radiation to his lumbar spine (10 fractions, total 30 Gy dose).He recently underwent consultation with Dr. Hughes to discuss  palliative therapy with percutaneous ablation and stabilization with cement augmentation and presents today for L2 percutaneous RFA/osteo cool and vertebral augmentation with balloon kyphoplasty.Risks and benefits of procedure were discussed with the patient including, but not limited to education regarding the natural healing process of compression fractures without intervention, bleeding, infection, cement migration which may cause spinal cord damage, paralysis, pulmonary embolism or even death.  This  interventional procedure involves the use of X-rays and because of the nature of the planned procedure, it is possible that we will have prolonged use of X-ray  fluoroscopy.  Potential radiation risks to you include (but are not limited to) the following: - A slightly elevated risk for cancer  several years later in life. This risk is typically less than 0.5% percent. This risk is low in comparison to the normal incidence of human cancer, which is 33% for women and 50% for men according to the American [employer] Society. - Radiation induced injury can include skin redness, resembling a rash, tissue breakdown / ulcers and hair loss (which can be temporary or permanent).   The likelihood of either of these occurring depends on the difficulty of the procedure and whether you are sensitive to radiation due to previous procedures, disease, or genetic conditions.   IF your procedure requires a prolonged use of radiation, you will be notified and given written instructions for further action.  It is your responsibility to monitor the irradiated area for the 2 weeks following the procedure and to notify your physician if you are concerned that you have suffered a radiation induced injury.    All of the patient's questions were answered, patient is agreeable to proceed.  Consent signed and in chart.      Thank you for allowing our service to participate in ANTWANE GROSE 's care.  Electronically Signed: D. Franky Rakers, PA-C   10/15/2024, 4:29 PM      I spent a total of  25 minutes   in face to face in clinical consultation, greater than 50% of which was counseling/coordinating care for L (lumbar)2 Percutaneous RFA (radiofrequency ablation) Osteocool and Vertebral Augmentation with Balloon Kyphoplasty    "

## 2024-10-16 ENCOUNTER — Ambulatory Visit (INDEPENDENT_AMBULATORY_CARE_PROVIDER_SITE_OTHER): Admitting: Otolaryngology

## 2024-10-16 ENCOUNTER — Ambulatory Visit (HOSPITAL_COMMUNITY)
Admission: RE | Admit: 2024-10-16 | Discharge: 2024-10-16 | Disposition: A | Discharge status: Home / Self Care | Source: Ambulatory Visit | Attending: Interventional Radiology | Admitting: Interventional Radiology

## 2024-10-16 ENCOUNTER — Other Ambulatory Visit (HOSPITAL_COMMUNITY): Payer: Self-pay | Admitting: Interventional Radiology

## 2024-10-16 ENCOUNTER — Encounter (HOSPITAL_COMMUNITY): Payer: Self-pay

## 2024-10-16 ENCOUNTER — Encounter: Payer: Self-pay | Admitting: Internal Medicine

## 2024-10-16 ENCOUNTER — Other Ambulatory Visit: Payer: Self-pay

## 2024-10-16 DIAGNOSIS — M8458XA Pathological fracture in neoplastic disease, other specified site, initial encounter for fracture: Secondary | ICD-10-CM | POA: Diagnosis not present

## 2024-10-16 DIAGNOSIS — M549 Dorsalgia, unspecified: Secondary | ICD-10-CM | POA: Diagnosis not present

## 2024-10-16 DIAGNOSIS — Z87891 Personal history of nicotine dependence: Secondary | ICD-10-CM | POA: Insufficient documentation

## 2024-10-16 DIAGNOSIS — G4730 Sleep apnea, unspecified: Secondary | ICD-10-CM | POA: Insufficient documentation

## 2024-10-16 DIAGNOSIS — I2510 Atherosclerotic heart disease of native coronary artery without angina pectoris: Secondary | ICD-10-CM | POA: Insufficient documentation

## 2024-10-16 DIAGNOSIS — C7951 Secondary malignant neoplasm of bone: Secondary | ICD-10-CM | POA: Diagnosis not present

## 2024-10-16 DIAGNOSIS — I252 Old myocardial infarction: Secondary | ICD-10-CM | POA: Insufficient documentation

## 2024-10-16 DIAGNOSIS — C3490 Malignant neoplasm of unspecified part of unspecified bronchus or lung: Secondary | ICD-10-CM | POA: Diagnosis not present

## 2024-10-16 DIAGNOSIS — K8020 Calculus of gallbladder without cholecystitis without obstruction: Secondary | ICD-10-CM | POA: Insufficient documentation

## 2024-10-16 DIAGNOSIS — M8448XA Pathological fracture, other site, initial encounter for fracture: Secondary | ICD-10-CM | POA: Insufficient documentation

## 2024-10-16 DIAGNOSIS — Z7982 Long term (current) use of aspirin: Secondary | ICD-10-CM | POA: Diagnosis not present

## 2024-10-16 DIAGNOSIS — S32020A Wedge compression fracture of second lumbar vertebra, initial encounter for closed fracture: Secondary | ICD-10-CM | POA: Diagnosis present

## 2024-10-16 DIAGNOSIS — I10 Essential (primary) hypertension: Secondary | ICD-10-CM | POA: Insufficient documentation

## 2024-10-16 DIAGNOSIS — Z79899 Other long term (current) drug therapy: Secondary | ICD-10-CM | POA: Insufficient documentation

## 2024-10-16 DIAGNOSIS — F419 Anxiety disorder, unspecified: Secondary | ICD-10-CM | POA: Insufficient documentation

## 2024-10-16 DIAGNOSIS — K219 Gastro-esophageal reflux disease without esophagitis: Secondary | ICD-10-CM | POA: Diagnosis not present

## 2024-10-16 DIAGNOSIS — Z923 Personal history of irradiation: Secondary | ICD-10-CM | POA: Diagnosis not present

## 2024-10-16 DIAGNOSIS — Z21 Asymptomatic human immunodeficiency virus [HIV] infection status: Secondary | ICD-10-CM | POA: Insufficient documentation

## 2024-10-16 HISTORY — PX: IR BONE TUMOR(S)RF ABLATION: IMG2284

## 2024-10-16 HISTORY — PX: IR KYPHO LUMBAR INC FX REDUCE BONE BX UNI/BIL CANNULATION INC/IMAGING: IMG5519

## 2024-10-16 LAB — CBC WITH DIFFERENTIAL/PLATELET
Abs Immature Granulocytes: 0.02 K/uL (ref 0.00–0.07)
Basophils Absolute: 0 K/uL (ref 0.0–0.1)
Basophils Relative: 1 %
Eosinophils Absolute: 0.2 K/uL (ref 0.0–0.5)
Eosinophils Relative: 4 %
HCT: 30.6 % — ABNORMAL LOW (ref 39.0–52.0)
Hemoglobin: 9.5 g/dL — ABNORMAL LOW (ref 13.0–17.0)
Immature Granulocytes: 0 %
Lymphocytes Relative: 25 %
Lymphs Abs: 1.3 K/uL (ref 0.7–4.0)
MCH: 29.1 pg (ref 26.0–34.0)
MCHC: 31 g/dL (ref 30.0–36.0)
MCV: 93.6 fL (ref 80.0–100.0)
Monocytes Absolute: 0.6 K/uL (ref 0.1–1.0)
Monocytes Relative: 11 %
Neutro Abs: 3 K/uL (ref 1.7–7.7)
Neutrophils Relative %: 59 %
Platelets: 152 K/uL (ref 150–400)
RBC: 3.27 MIL/uL — ABNORMAL LOW (ref 4.22–5.81)
RDW: 17.1 % — ABNORMAL HIGH (ref 11.5–15.5)
WBC: 5.1 K/uL (ref 4.0–10.5)
nRBC: 0 % (ref 0.0–0.2)

## 2024-10-16 LAB — BASIC METABOLIC PANEL WITH GFR
Anion gap: 10 (ref 5–15)
BUN: 15 mg/dL (ref 6–20)
CO2: 24 mmol/L (ref 22–32)
Calcium: 10.2 mg/dL (ref 8.9–10.3)
Chloride: 106 mmol/L (ref 98–111)
Creatinine, Ser: 1.26 mg/dL — ABNORMAL HIGH (ref 0.61–1.24)
GFR, Estimated: >60 mL/min
Glucose, Bld: 80 mg/dL (ref 70–99)
Potassium: 4 mmol/L (ref 3.5–5.1)
Sodium: 141 mmol/L (ref 135–145)

## 2024-10-16 LAB — PROTIME-INR
INR: 1.1 (ref 0.8–1.2)
Prothrombin Time: 15 s (ref 11.4–15.2)

## 2024-10-16 MED ORDER — SODIUM CHLORIDE 0.9 % IV SOLN: INTRAVENOUS | Status: DC

## 2024-10-16 MED ORDER — BUPIVACAINE HCL (PF) 0.5 % IJ SOLN
INTRAMUSCULAR | Status: AC
  Filled 2024-10-16: qty 30

## 2024-10-16 MED ORDER — MIDAZOLAM HCL (PF) 2 MG/2ML IJ SOLN
INTRAMUSCULAR | Status: AC | PRN
  Administered 2024-10-16: 1 mg via INTRAVENOUS

## 2024-10-16 MED ORDER — DIPHENHYDRAMINE HCL 50 MG/ML IJ SOLN
INTRAMUSCULAR | Status: AC
  Filled 2024-10-16: qty 1

## 2024-10-16 MED ORDER — CEFAZOLIN SODIUM-DEXTROSE 2-4 GM/100ML-% IV SOLN
INTRAVENOUS | Status: AC
  Filled 2024-10-16: qty 100

## 2024-10-16 MED ORDER — DIPHENHYDRAMINE HCL 50 MG/ML IJ SOLN
INTRAMUSCULAR | Status: AC | PRN
  Administered 2024-10-16: 50 mg via INTRAVENOUS

## 2024-10-16 MED ORDER — FENTANYL CITRATE (PF) 100 MCG/2ML IJ SOLN
INTRAMUSCULAR | Status: AC | PRN
  Administered 2024-10-16: 50 ug via INTRAVENOUS

## 2024-10-16 MED ORDER — FENTANYL CITRATE (PF) 100 MCG/2ML IJ SOLN
INTRAMUSCULAR | Status: AC
  Filled 2024-10-16: qty 2

## 2024-10-16 MED ORDER — LIDOCAINE HCL (PF) 1 % IJ SOLN
INTRAMUSCULAR | Status: AC
  Filled 2024-10-16: qty 30

## 2024-10-16 MED ORDER — MIDAZOLAM HCL 2 MG/2ML IJ SOLN
INTRAMUSCULAR | Status: AC
  Filled 2024-10-16: qty 2

## 2024-10-16 MED ORDER — BUPIVACAINE HCL (PF) 0.5 % IJ SOLN: 30.0000 mL | Freq: Once | INTRAMUSCULAR | Status: DC

## 2024-10-16 MED ORDER — CEFAZOLIN SODIUM-DEXTROSE 2-4 GM/100ML-% IV SOLN
2.0000 g | INTRAVENOUS | Status: AC
  Administered 2024-10-16: 2 g via INTRAVENOUS
  Filled 2024-10-16: qty 100

## 2024-10-16 MED ORDER — IOHEXOL 300 MG/ML  SOLN: 50.0000 mL | Freq: Once | INTRAMUSCULAR | Status: DC | PRN

## 2024-10-16 MED ORDER — HYDROCODONE-ACETAMINOPHEN 5-325 MG PO TABS
2.0000 | ORAL_TABLET | ORAL | Status: DC | PRN
  Administered 2024-10-16: 2 via ORAL
  Filled 2024-10-16: qty 2

## 2024-10-16 MED ORDER — LIDOCAINE HCL (PF) 1 % IJ SOLN: 20.0000 mL | Freq: Once | INTRAMUSCULAR | Status: DC

## 2024-10-16 NOTE — Sedation Documentation (Signed)
 RN Rosina R. pulled 6mg  Versed  and 300mcg Fentanyl  in IR pyxis. Pt. Received 5mg  Versed  and 250mcg Fentanyl  throughout the procedure. RN Rosina wasted 1mg  Versed  and 50mcg Fentanyl  with RN Rolan HERO. In IR pyxis.

## 2024-10-16 NOTE — Progress Notes (Signed)
 Neuro exam performed s/p L2 vertebral body biopsy and ablation kyphoplasty. Patient was alert and oriented with no focal deficits. He complained of pain on his lower back where his procedure was performed. 5/5 strength in upper and lower extremities with sensation intact. Denies numbness or paraesthesias.     Performed by: Fredric Rummer, PA-C

## 2024-10-16 NOTE — Discharge Instructions (Addendum)
Discharge Instructions:   Please call Interventional Radiology clinic [phone] with any questions or concerns.  You may remove your dressing and shower tomorrow.      Moderate Conscious Sedation, Adult, Care After This sheet gives you information about how to care for yourself after your procedure. Your health care provider may also give you more specific instructions. If you have problems or questions, contact your health care provider. What can I expect after the procedure? After the procedure, it is common to have: Sleepiness for several hours. Impaired judgment for several hours. Difficulty with balance. Vomiting if you eat too soon. Follow these instructions at home: For the time period you were told by your health care provider:  Rest. Do not participate in activities where you could fall or become injured. Do not drive or use machinery. Do not drink alcohol. Do not take sleeping pills or medicines that cause drowsiness. Do not make important decisions or sign legal documents. Do not take care of children on your own. Eating and drinking  Follow the diet recommended by your health care provider. Drink enough fluid to keep your urine pale yellow. If you vomit: Drink water, juice, or soup when you can drink without vomiting. Make sure you have little or no nausea before eating solid foods. General instructions Take over-the-counter and prescription medicines only as told by your health care provider. Have a responsible adult stay with you for the time you are told. It is important to have someone help care for you until you are awake and alert. Do not smoke. Keep all follow-up visits as told by your health care provider. This is important. Contact a health care provider if: You are still sleepy or having trouble with balance after 24 hours. You feel light-headed. You keep feeling nauseous or you keep vomiting. You develop a rash. You have a fever. You have redness  or swelling around the IV site. Get help right away if: You have trouble breathing. You have new-onset confusion at home. Summary After the procedure, it is common to feel sleepy, have impaired judgment, or feel nauseous if you eat too soon. Rest after you get home. Know the things you should not do after the procedure. Follow the diet recommended by your health care provider and drink enough fluid to keep your urine pale yellow. Get help right away if you have trouble breathing or new-onset confusion at home. This information is not intended to replace advice given to you by your health care provider. Make sure you discuss any questions you have with your health care provider. Document Revised: 02/08/2020 Document Reviewed: 09/06/2019 Elsevier Patient Education  2023 [employer]  

## 2024-10-16 NOTE — Progress Notes (Signed)
 1700 Ice bag given to use as needed to back for comfort per instructions.

## 2024-10-16 NOTE — Procedures (Signed)
 Vascular and Interventional Radiology Procedure Note  Patient: ARK AGRUSA DOB: 04-18-1966 Medical Record Number: [id number] Note Date/Time: 10/16/2024 1:47 PM   Performing Physician: Thom Hall, MD Assistant(s): None  Diagnosis: Symptomatic L2 pathologic fx, s/p XRT. Hx met lung CA   Procedure: L2 VERTEBRAL BODY BIOPSY, (OsteoCool) ABLATION  KYPHOPLASTY  Anesthesia: Conscious Sedation Complications: None Estimated Blood Loss: Minimal Specimens: Sent for Pathology  Findings:  Successful Fluoroscopy-guided L2 vertebral body Bx, RFA ablation and bipedicular Kyphoplasty. A total of 5 mL PMMA was used. Hemostasis of the tract was achieved using Manual Pressure.  Plan: Bed rest for 2 hours.  See detailed procedure note with images in PACS. The patient tolerated the procedure well without incident or complication and was returned to Recovery in stable condition.    Thom Hall, MD [facility] Radiology   Pager. [phone] Clinic. [phone]

## 2024-10-17 ENCOUNTER — Inpatient Hospital Stay

## 2024-10-17 ENCOUNTER — Inpatient Hospital Stay: Admitting: Internal Medicine

## 2024-10-17 ENCOUNTER — Other Ambulatory Visit: Payer: Self-pay | Admitting: Physician Assistant

## 2024-10-17 DIAGNOSIS — C3491 Malignant neoplasm of unspecified part of right bronchus or lung: Secondary | ICD-10-CM

## 2024-10-17 MED ORDER — PROCHLORPERAZINE MALEATE 10 MG PO TABS: 10.0000 mg | ORAL_TABLET | Freq: Four times a day (QID) | ORAL | 2 refills | Status: AC | PRN

## 2024-10-17 NOTE — Progress Notes (Signed)
 Pharmacist Chemotherapy Monitoring - Initial Assessment    Anticipated start date: 10/29/24   The following has been reviewed per standard work regarding the patient's treatment regimen: The patient's diagnosis, treatment plan and drug doses, and organ/hematologic function Lab orders and baseline tests specific to treatment regimen  The treatment plan start date, drug sequencing, and pre-medications Prior authorization status  Patient's documented medication list, including drug-drug interaction screen and prescriptions for anti-emetics and supportive care specific to the treatment regimen The drug concentrations, fluid compatibility, administration routes, and timing of the medications to be used The patient's access for treatment and lifetime cumulative dose history, if applicable  The patient's medication allergies and previous infusion related reactions, if applicable   Changes made to treatment plan:  N/A  Follow up needed:  Send script for Compazine  per Cassie's note on 12/11? In basket message sent  Benjamin Herrera, [facility], 10/17/2024  11:33 AM

## 2024-10-19 LAB — SURGICAL PATHOLOGY

## 2024-10-22 ENCOUNTER — Encounter: Payer: Self-pay | Admitting: *Deleted

## 2024-10-27 NOTE — Progress Notes (Unsigned)
  [facility] OFFICE PROGRESS NOTE  [name], MD [address] [place] [address] [place] [address]  DIAGNOSIS: Stage IV (T3, N3, M1 C) non-small cell lung cancer, adenocarcinoma presented with large right upper lobe lung mass in addition to right hilar and mediastinal as well as right supraclavicular lymphadenopathy and metastatic bone lesions including the left proximal humerus, L2 as well as sacral area and left adrenal metastasis diagnosed in June 2025.   Molecular studies show PDL1 of 0%   Biomarker Findings Blood Tumor Mutational Burden - 37 Muts/Mb ctDNA Tumor Fraction - High (2.4%) Microsatellite status - MSI-High Not Detected Genomic Findings For a complete list of the genes assayed, please refer to the Appendix. ARID1A E2250* KEAP1 G524C, G452fs*23 CTNNA1 splice site 2192+1G>C DNMT3A R882C TET2 K1197* TP53 splice site 813_[id number], deletion exons 8-9 This assay tested >300 cancer-related genes, including the following 8 gene(s) routinely assessed in this tumor type: ALK, BRAF, EGFR, ERBB2, KRAS, MET, RET, ROS1.  PRIOR THERAPY: 1) Palliative radiation to the painful bone lesion in the left humerus. Last day on 05/03/24.  2) SRS to the brain lesion, completed on on 06/20/24 3) SRS to the new brain lesions, completed on 09/13/24 4) Palliative radiation to the left acetabulum and sacrum last day on 10/10/24.  5) Systemic chemotherapy with Carboplatin  for an AUC of 5, Alimta  500 mg/m2, and Libatyo IV every 3 weeks. First dose on 05/02/24. Doses of Alimta  reduced to 400 mg/m2 starting from cycle #2 due to HIV and neutropenia.  Starting from cycle #5 the patient will be on maintenance Alimta  and Libtayo .  Status post 7 cycles. Discontinued due to disease progression   CURRENT THERAPY: Palliative Systemic chemotherapy with docetaxel  65 mg/m2 (dose reduced) and Cyramza  IV every 3 weeks with Neulasta  support. First dose expected ~10/29/24  INTERVAL  HISTORY: SHYLO DILLENBECK 59 y.o. male returns returns to the clinic today for a follow-up visit. His cousin is available by phone.  The patient was last seen in clinic on 10/04/2024 by Dr. Sherrod and myself.  At that point in time he was found to have worsening disease progression. Dr. Sherrod changed his chemotherapy. He is expected to undergo his first cycle today. His dose of docetaxel  is reduced.   In the interval since being seen he underwent kyphoplasty on 10/16/2024. He forgot to take his pain medications today so he is in pain. He was in a rush this morning with his ride getting to his house early. He states when he does take his pain medication that it does control his pain. He is scheduled to talk to palliative care today. They are wondering if we have any lidocaine  patches in the clinic.   He also completed palliative radiation to the left pelvis, left spine, and brain.   He is prescribed steroids for chemotherapy-related side effects but forgot to take his pre-medications. He is scheduled to receive a white cell growth factor injection later this week.   He continues to experience poor appetite, significant unintentional weight loss, and marked dysgeusia, stating he has 'not been able to eat' and 'I don't want to taste bud.' He is taking an appetite stimulant without noticeable benefit. He is on remeron . He uses a mouth rinse for oral thrush, but his supply is depleted and requires refill.  He has experienced labile blood counts during chemotherapy, previously requiring transfusions with subsequent improvement in energy. His current hemoglobin is 10.2, the highest in recent months, likely due to a prolonged interval  between chemotherapy cycles.  He saw his orthopedic oncologist at [facility]. They reported that he did not need surgical intervention for his metastatic bone lesion.   His last appointment he had a parotid infection and completed antibiotics with clindamycin .  He  states that that has improved at this time.  He has HIV and follows with Dr. Eben.   He denies any recent fever or chills.  He has lost weight.  He does drink protein supplemental drinks.  He is previously been seen by nutritionist.  He reports his breathing is the same.  He denies any cough or hemoptysis.  He denies any abnormal bleeding or bruising.  He denies any unusual headaches.  Denies any constipation or diarrhea.  He denies any nausea or vomiting.  He is here today for evaluation and repeat blood work before starting cycle #1.   He is here today for evaluation and repeat blood work before undergoing cycle #1.    MEDICAL HISTORY: Past Medical History:  Diagnosis Date   Achalasia and cardiospasm    Allergy    Anxiety    Arthritis    back ,shoulders    Asthma    Boils    under arms hx of   CAD (coronary artery disease)    a. 09/2016: cath showing 100% RCA occlusion with collaterals and 90% OM1 (treated with DES)   Candidiasis of mouth    Candidiasis of unspecified site    Cholelithiasis    Cocaine abuse (HCC)    Quit 09/2016   Condyloma acuminatum in male of scrotum & anal canal s/p laser ablation 07/03/2012   Coronary artery disease    Dysphagia    Fracture, humerus    Frozen shoulder    left   GERD (gastroesophageal reflux disease)    Heart attack (HCC)    Hemorrhoids    Hepatitis 1993   A   Herpes labialis    HIV (human immunodeficiency virus infection) (HCC) DX 1993   Hypertension Dx 2014   off all bp meds for last 9 or 10 months   Pilonidal disease 01/10/2012   Polysubstance abuse (HCC)    Psoriasis    hx of   Sleep apnea    does  have CPAP   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  MEDICATIONS:  Current Outpatient Medications  Medication Sig Dispense Refill   acetaminophen  (TYLENOL ) 325 MG  tablet Take 650 mg by mouth every 6 (six) hours as needed for mild pain (pain score 1-3), moderate pain (pain score 4-6) or headache.     albuterol  (VENTOLIN  HFA) 108 (90 Base) MCG/ACT inhaler Inhale 2 puffs into the lungs every 6 (six) hours as needed for wheezing or shortness of breath. 1 each 2   amoxicillin -clavulanate (AUGMENTIN ) 875-125 MG tablet Take 1 tablet by mouth 2 (two) times daily. 20 tablet 0   aspirin  81 MG chewable tablet Chew 1 tablet (81 mg total) by mouth daily. 90 tablet 3   Bictegravir-Emtricitab-Tenofov (BIKTARVY ) 30-120-15 MG TABS Take 1 tablet by mouth daily. 90 tablet 3   Blood Pressure KIT 1 kit by Does not apply route daily. 1 kit 0   dexamethasone  (DECADRON ) 4 MG tablet Take TWO tablets TWICE a day the day before chemotherapy, the day of chemotherapy, and the day after chemotherapy 40 tablet 2  diclofenac  Sodium (VOLTAREN  ARTHRITIS PAIN) 1 % GEL Apply 2 g topically 4 (four) times daily. 100 g 3   hydrOXYzine  (ATARAX ) 25 MG tablet Take 1 tablet (25 mg total) by mouth 3 (three) times daily as needed. 30 tablet 0   lidocaine  (XYLOCAINE ) 2 % solution SWISH AND SWALLOW 5 MLS 3 (THREE) TIMES DAILY AS NEEDED FOR MOUTH PAIN. 360 mL 1   LORazepam  (ATIVAN ) 0.5 MG tablet TAKE 1 TAB BY MOUTH 30 MINUTES PRIOR TO RADIATION OR MRI SCANS 4 tablet 0   magic mouthwash (nystatin , diphenhydrAMINE , alum & mag hydroxide) suspension mixture Take 5 mLs by mouth 3 (three) times daily as needed for mouth pain. 210 mL 1   methocarbamol  (ROBAXIN ) 500 MG tablet Take 2 tablets (1,000 mg total) by mouth every 8 (eight) hours as needed. 90 tablet 1   metoprolol  succinate (TOPROL -XL) 25 MG 24 hr tablet TAKE 1/2 TABLET BY MOUTH DAILY 45 tablet 0   mirtazapine  (REMERON ) 15 MG tablet TAKE 1 TABLET BY MOUTH EVERYDAY AT BEDTIME 90 tablet 1   nitroGLYCERIN  (NITROSTAT ) 0.4 MG SL tablet Place 1 tablet (0.4 mg total) under the tongue every 5 (five) minutes as needed for chest pain (trouble swallowing). 25 tablet  1   oxyCODONE -acetaminophen  (PERCOCET/ROXICET) 5-325 MG tablet Take 1 tablet by mouth every 6 (six) hours as needed for severe pain (pain score 7-10). 60 tablet 0   potassium chloride  SA (KLOR-CON  M) 20 MEQ tablet Take 1 tablet (20 mEq total) by mouth daily. 6 tablet 0   predniSONE  (DELTASONE ) 50 MG tablet Take one tablet 13 hours before , one tablet 7 hours before and one tablet one hour prior to CT scan. Take benadryl  50 mg one hour prior to scan 3 tablet 0   prochlorperazine  (COMPAZINE ) 10 MG tablet Take 1 tablet (10 mg total) by mouth every 6 (six) hours as needed. 30 tablet 2   sulfamethoxazole -trimethoprim  (BACTRIM  DS) 800-160 MG tablet Take 1 tablet by mouth 3 (three) times a week. 36 tablet 3   amLODipine  (NORVASC ) 5 MG tablet Take 1 tablet (5 mg total) by mouth daily. (Patient not taking: Reported on 10/29/2024) 90 tablet 1   nystatin  (MYCOSTATIN ) 100000 UNIT/ML suspension Take 5 mLs by mouth 4 (four) times daily. 60 mL 0   No current facility-administered medications for this visit.    SURGICAL HISTORY:  Past Surgical History:  Procedure Laterality Date   BALLOON DILATION N/A 11/11/2015   Procedure: BALLOON DILATION;  Surgeon: Gustav Shila GAILS, MD;  Location: [facility];  Service: Endoscopy;  Laterality: N/A;   BOTOX  INJECTION  10/11/2012   Procedure: BOTOX  INJECTION;  Surgeon: Lamar JONETTA Aho, MD;  Location: [facility];  Service: Endoscopy;  Laterality: N/A;   BOTOX  INJECTION N/A 01/25/2013   Procedure: BOTOX  INJECTION;  Surgeon: Lamar JONETTA Aho, MD;  Location: [facility];  Service: Endoscopy;  Laterality: N/A;   BOTOX  INJECTION N/A 06/21/2013   Procedure: BOTOX  INJECTION;  Surgeon: Lamar JONETTA Aho, MD;  Location: [facility];  Service: Endoscopy;  Laterality: N/A;   BOTOX  INJECTION N/A 10/08/2013   Procedure: BOTOX  INJECTION;  Surgeon: Lamar JONETTA Aho, MD;  Location: [facility];  Service: Endoscopy;  Laterality: N/A;   BOTOX  INJECTION N/A 06/16/2015   Procedure: BOTOX   INJECTION;  Surgeon: Lamar JONETTA Aho, MD;  Location: [facility];  Service: Endoscopy;  Laterality: N/A;   BRONCHIAL BIOPSY  04/03/2024   Procedure: BRONCHOSCOPY, WITH BIOPSY;  Surgeon: Shelah Lamar RAMAN, MD;  Location: [facility] ENDOSCOPY;  Service: Pulmonary;;  BRONCHIAL BRUSHINGS  04/03/2024   Procedure: BRONCHOSCOPY, WITH BRUSH BIOPSY;  Surgeon: Shelah Lamar RAMAN, MD;  Location: [facility] ENDOSCOPY;  Service: Pulmonary;;   BRONCHIAL NEEDLE ASPIRATION BIOPSY  04/03/2024   Procedure: BRONCHOSCOPY, WITH NEEDLE ASPIRATION BIOPSY;  Surgeon: Shelah Lamar RAMAN, MD;  Location: [facility] ENDOSCOPY;  Service: Pulmonary;;   BRONCHOSCOPY, WITH BIOPSY USING ELECTROMAGNETIC NAVIGATION Right 04/03/2024   Procedure: BRONCHOSCOPY, WITH BIOPSY USING ELECTROMAGNETIC NAVIGATION;  Surgeon: Shelah Lamar RAMAN, MD;  Location: [facility] ENDOSCOPY;  Service: Pulmonary;  Laterality: Right;   CARDIAC CATHETERIZATION N/A 10/11/2016   Procedure: Left Heart Cath and Coronary Angiography;  Surgeon: Lonni JONETTA Cash, MD;  mRCA 100% w/ L>R collaterals, OM1 90%   CARDIAC CATHETERIZATION N/A 10/11/2016   Procedure: Coronary Stent Intervention;  Surgeon: Lonni JONETTA Cash, MD;  RESOLUTE ONYX 3.5X15 DES OM1   COLONOSCOPY  05/19/2012   Procedure: COLONOSCOPY;  Surgeon: Lamar JONETTA Aho, MD;  Location: [facility];  Service: Endoscopy;  Laterality: N/A;  jill trying to contact pt to come in 0830 for 930 case, phone not accepting messages   COLONOSCOPY     ESOPHAGEAL MANOMETRY  05/29/2012   Procedure: ESOPHAGEAL MANOMETRY (EM);  Surgeon: Lamar JONETTA Aho, MD;  Location: [facility];  Service: Endoscopy;  Laterality: N/A;   ESOPHAGEAL MANOMETRY N/A 10/06/2015   Procedure: ESOPHAGEAL MANOMETRY (EM);  Surgeon: Gustav Shila GAILS, MD;  Location: [facility];  Service: Endoscopy;  Laterality: N/A;   ESOPHAGOGASTRODUODENOSCOPY  11/11/2011   Procedure: ESOPHAGOGASTRODUODENOSCOPY (EGD);  Surgeon: Lamar JONETTA Aho, MD;  Location: [facility];  Service: Endoscopy;   Laterality: N/A;  botox  injection  called Pt to change time of procedure per Dr Aho   ESOPHAGOGASTRODUODENOSCOPY  03/10/2012   Procedure: ESOPHAGOGASTRODUODENOSCOPY (EGD);  Surgeon: Lamar JONETTA Aho, MD;  Location: [facility];  Service: Endoscopy;  Laterality: N/A;   ESOPHAGOGASTRODUODENOSCOPY  10/11/2012   Procedure: ESOPHAGOGASTRODUODENOSCOPY (EGD);  Surgeon: Lamar JONETTA Aho, MD;  Location: [facility];  Service: Endoscopy;  Laterality: N/A;   ESOPHAGOGASTRODUODENOSCOPY N/A 01/25/2013   Procedure: ESOPHAGOGASTRODUODENOSCOPY (EGD);  Surgeon: Lamar JONETTA Aho, MD;  Location: [facility];  Service: Endoscopy;  Laterality: N/A;   ESOPHAGOGASTRODUODENOSCOPY N/A 06/21/2013   Procedure: ESOPHAGOGASTRODUODENOSCOPY (EGD);  Surgeon: Lamar JONETTA Aho, MD;  Location: [facility];  Service: Endoscopy;  Laterality: N/A;   ESOPHAGOGASTRODUODENOSCOPY (EGD) WITH PROPOFOL  N/A 10/08/2013   Procedure: ESOPHAGOGASTRODUODENOSCOPY (EGD) WITH PROPOFOL ;  Surgeon: Lamar JONETTA Aho, MD;  Location: [facility];  Service: Endoscopy;  Laterality: N/A;   ESOPHAGOGASTRODUODENOSCOPY (EGD) WITH PROPOFOL  N/A 06/16/2015   Procedure: ESOPHAGOGASTRODUODENOSCOPY (EGD) WITH PROPOFOL ;  Surgeon: Lamar JONETTA Aho, MD;  Location: [facility];  Service: Endoscopy;  Laterality: N/A;   ESOPHAGOGASTRODUODENOSCOPY (EGD) WITH PROPOFOL  N/A 11/11/2015   Procedure: ESOPHAGOGASTRODUODENOSCOPY (EGD) WITH PROPOFOL ;  Surgeon: Gustav Shila GAILS, MD;  Location: [facility];  Service: Endoscopy;  Laterality: N/A;   EXAMINATION UNDER ANESTHESIA  07/24/2012   Procedure: EXAM UNDER ANESTHESIA;  Surgeon: Elspeth KYM Schultze, MD;  Location: [facility];  Service: General;  Laterality: N/A;   humeral fracture surgery Right yrs ago   IR BONE TUMOR(S)RF ABLATION  10/16/2024   IR KYPHO LUMBAR INC FX REDUCE BONE BX UNI/BIL CANNULATION INC/IMAGING  10/16/2024   IR RADIOLOGIST EVAL & MGMT  09/12/2024   PILONIDAL CYST EXCISION  07/24/2012   Procedure: CYST EXCISION  PILONIDAL SIMPLE;  Surgeon: Elspeth KYM Schultze, MD;  Location: [facility];  Service: General;  Laterality: N/A;  Exam Under Anesthesia,, Excision Pilonidal Disease,    right shoulder replacement  2008   x  2   UPPER GASTROINTESTINAL ENDOSCOPY     WART FULGURATION  07/24/2012   Procedure: FULGURATION ANAL WART;  Surgeon: Elspeth KYM Schultze, MD;  Location: [facility];  Service: General;  Laterality: N/A;  excision of raphe mass    REVIEW OF SYSTEMS:   Review of Systems  Constitutional: Positive for fatigue, decrease appetite, and weight loss. Negative for chills and fever.  HENT: Positive for oral thrush and taste alterations. Negative for mouth sores, nosebleeds, sore throat and trouble swallowing.   Eyes: Negative for eye problems and icterus.  Respiratory: Stable dyspnea on exertion. Negative for cough, hemoptysis,  and wheezing.   Cardiovascular: Negative for chest pain and leg swelling.  Gastrointestinal: Negative for abdominal pain, constipation, diarrhea, nausea and vomiting.  Genitourinary: Negative for bladder incontinence, difficulty urinating, dysuria, frequency and hematuria.   Musculoskeletal: Positive for back pain. Negative for gait problem, neck pain and neck stiffness.  Skin: Negative for itching and rash.  Neurological: Negative for dizziness, extremity weakness, gait problem, headaches, light-headedness and seizures.  Hematological: Negative for adenopathy. Does not bruise/bleed easily.  Psychiatric/Behavioral: Negative for confusion, depression and sleep disturbance. The patient is not nervous/anxious.     PHYSICAL EXAMINATION:  Blood pressure 130/83, pulse (!) 108, temperature (!) 97.4 F (36.3 C), temperature source Temporal, resp. rate 17, height 6' 1 (1.854 m), weight 164 lb (74.4 kg), SpO2 100%.  ECOG PERFORMANCE STATUS: 1-2  Physical Exam  Constitutional: Oriented to person, place, and time and chronically ill appearing and in no distress.  HENT:  Head: Normocephalic and  atraumatic.  Mouth/Throat: Oropharynx is clear and moist. No oropharyngeal exudate.  Eyes: Conjunctivae are normal. Right eye exhibits no discharge. Left eye exhibits no discharge. No scleral icterus.  Neck: Normal range of motion. Neck supple.  Cardiovascular: Normal rate, regular rhythm, normal heart sounds and intact distal pulses.   Pulmonary/Chest: Effort normal and breath sounds normal. No respiratory distress. No wheezes. No rales.  Abdominal: Soft. Bowel sounds are normal. Exhibits no distension and no mass. There is no tenderness.  Musculoskeletal: Normal range of motion. Exhibits no edema.  Lymphadenopathy:    No cervical adenopathy.  Neurological: Alert and oriented to person, place, and time. Exhibits muscle wasting. Ambulates with a walker.  Skin: Skin is warm and dry. No rash noted. Not diaphoretic. No erythema. No pallor.  Psychiatric: Mood, memory and judgment normal.  Vitals reviewed.  LABORATORY DATA: Lab Results  Component Value Date   WBC 5.3 10/29/2024   HGB 10.2 (L) 10/29/2024   HCT 30.6 (L) 10/29/2024   MCV 83.8 10/29/2024   PLT 182 10/29/2024      Chemistry      Component Value Date/Time   NA 138 10/29/2024 0942   NA 136 06/19/2024 1445   K 3.8 10/29/2024 0942   CL 102 10/29/2024 0942   CO2 22 10/29/2024 0942   BUN 18 10/29/2024 0942   BUN 20 06/19/2024 1445   CREATININE 1.61 (H) 10/29/2024 0942   CREATININE 0.96 07/30/2020 1437      Component Value Date/Time   CALCIUM  9.8 10/29/2024 0942   ALKPHOS 70 10/29/2024 0942   AST 28 10/29/2024 0942   ALT 6 10/29/2024 0942   BILITOT 0.4 10/29/2024 0942       RADIOGRAPHIC STUDIES:  IR KYPHO LUMBAR INC FX REDUCE BONE BX UNI/BIL CANNULATION INC/IMAGING Result Date: 10/16/2024 CLINICAL DATA:  L2 KP/osteocool; L2 KYPHO/ OSTEOCOOL Briefly, 59 year old male with a history of metastatic lung cancer and symptomatic, pathologic L2 vertebral  body s/p XRT. EXAM: L2 VERTEBRAL BODY BIOPSY, RADIOFREQUENCY  (OsteoCool) ABLATION and KYPHOPLASTY AUGMENTATION COMPARISON:  CT CAP, 09/18/2024.  MR L-spine, 08/31/2024. MEDICATIONS: Ancef  2 g IV; The antibiotic was administered in an appropriate time interval prior to needle puncture of the skin. 20 mL of 0.5% bupivacaine  IM within the access level paraspinal muscles. 50 mg Benadryl  IV ANESTHESIA/SEDATION: Moderate (conscious) sedation was employed during this procedure. A total of Versed  5 mg and Fentanyl  250 mcg was administered intravenously. Moderate Sedation Time: 60 minutes. The patient's level of consciousness and vital signs were monitored continuously by radiology nursing throughout the procedure under my direct supervision. FLUOROSCOPY: Radiation Exposure Index and estimated peak skin dose (PSD); Reference air kerma (RAK), 171 mGy. COMPLICATIONS: None immediate. TECHNIQUE: Informed written consent was obtained from the patient after a thorough discussion of the procedural risks, benefits and alternatives. All questions were addressed. Maximal Sterile Barrier Technique was utilized including caps, mask, sterile gowns, sterile gloves, sterile drape, hand hygiene and skin antiseptic. A timeout was performed prior to the initiation of the procedure. The patient was placed prone on the fluoroscopic table. The skin overlying the lumbar spine region was then prepped and draped in the usual sterile fashion. Maximal barrier sterile technique was utilized including caps, mask, sterile gowns, sterile gloves, sterile drape, hand hygiene and skin antiseptic. The RIGHT pedicle at L2 was then infiltrated with 1% lidocaine  and 0.5% bupivacaine  followed by the advancement of a Kyphon trocar needle through the RIGHT pedicle into the posterior one-third of the vertebral body. Bone biopsy was performed. Subsequently, the osteo drill was advanced to the anterior third of the vertebral body. The osteo drill was retracted. Through the working cannula, a 15 mm OsteoCool RF ablation probe  was inserted and positioned under fluoroscopic guidance. In similar fashion, the LEFT L2 pedicle was infiltrated with 1% lidocaine  and 0.5% bupivacaine . A second Kyphon trocar needle was advanced into the posterior third of the vertebral body. Bone biopsy was also performed at the side, then the osteo drill was coaxially advanced to the anterior third. The osteo drill was exchanged for a 15 mm OsteoCool RF ablation probe which was positioned under fluoroscopic guidance. With both ablation probes in place, the ablation was performed for 7.5 minutes. Attention was now paid towards the, positioned with the distal marker approximately 5 mm from the anterior aspect of the cortex. Appropriate positioning was confirmed on the AP projection. At this time, the balloon was expanded using contrast via a Kyphon inflation syringe device via micro tubing. Inflations were continued under direct fluoroscopic guidance. At this time, methylmethacrylate mixture was reconstituted in the Kyphon bone mixing device system. This was then loaded into the delivery mechanism, attached to Kyphon bone fillers. The balloons were deflated and removed followed by the instillation of methylmethacrylate mixture with excellent filling in the AP and lateral projections. The working cannulae and the bone filler were then retrieved and removed. Multiple spot radiographic images were obtained in various obliquities. Hemostasis was achieved with manual compression. The patient tolerated the procedure well without immediate postprocedural complication. FINDINGS: *Bipedicular L2 bone biopsy with adequate tissue retrieval. This was sent in formalin to pathology for analysis. *Adequate cement filling of the L2 vertebral body on both the AP and lateral projections. A total of 5 mL PMMA was injected. *Superior endplate discontinuity with small volume extravasation into the L1-L2 disc space. *No epidural venous extravasation or additional extravasation noted  posteriorly into the spinal canal or along the pedicular access. IMPRESSION:  Successful L2 vertebral body biopsy, radiofrequency OsteoCool ablation and bi-pedicular cement augmentation with balloon kyphoplasty. Thom Hall, MD [facility] Radiology Electronically Signed   By: Thom Hall M.D.   On: 10/16/2024 17:50   IR Bone Tumor(s)RF Ablation Result Date: 10/16/2024 CLINICAL DATA:  L2 KP/osteocool; L2 KYPHO/ OSTEOCOOL Briefly, 59 year old male with a history of metastatic lung cancer and symptomatic, pathologic L2 vertebral body s/p XRT. EXAM: L2 VERTEBRAL BODY BIOPSY, RADIOFREQUENCY (OsteoCool) ABLATION and KYPHOPLASTY AUGMENTATION COMPARISON:  CT CAP, 09/18/2024.  MR L-spine, 08/31/2024. MEDICATIONS: Ancef  2 g IV; The antibiotic was administered in an appropriate time interval prior to needle puncture of the skin. 20 mL of 0.5% bupivacaine  IM within the access level paraspinal muscles. 50 mg Benadryl  IV ANESTHESIA/SEDATION: Moderate (conscious) sedation was employed during this procedure. A total of Versed  5 mg and Fentanyl  250 mcg was administered intravenously. Moderate Sedation Time: 60 minutes. The patient's level of consciousness and vital signs were monitored continuously by radiology nursing throughout the procedure under my direct supervision. FLUOROSCOPY: Radiation Exposure Index and estimated peak skin dose (PSD); Reference air kerma (RAK), 171 mGy. COMPLICATIONS: None immediate. TECHNIQUE: Informed written consent was obtained from the patient after a thorough discussion of the procedural risks, benefits and alternatives. All questions were addressed. Maximal Sterile Barrier Technique was utilized including caps, mask, sterile gowns, sterile gloves, sterile drape, hand hygiene and skin antiseptic. A timeout was performed prior to the initiation of the procedure. The patient was placed prone on the fluoroscopic table. The skin overlying the lumbar  spine region was then prepped and draped in the usual sterile fashion. Maximal barrier sterile technique was utilized including caps, mask, sterile gowns, sterile gloves, sterile drape, hand hygiene and skin antiseptic. The RIGHT pedicle at L2 was then infiltrated with 1% lidocaine  and 0.5% bupivacaine  followed by the advancement of a Kyphon trocar needle through the RIGHT pedicle into the posterior one-third of the vertebral body. Bone biopsy was performed. Subsequently, the osteo drill was advanced to the anterior third of the vertebral body. The osteo drill was retracted. Through the working cannula, a 15 mm OsteoCool RF ablation probe was inserted and positioned under fluoroscopic guidance. In similar fashion, the LEFT L2 pedicle was infiltrated with 1% lidocaine  and 0.5% bupivacaine . A second Kyphon trocar needle was advanced into the posterior third of the vertebral body. Bone biopsy was also performed at the side, then the osteo drill was coaxially advanced to the anterior third. The osteo drill was exchanged for a 15 mm OsteoCool RF ablation probe which was positioned under fluoroscopic guidance. With both ablation probes in place, the ablation was performed for 7.5 minutes. Attention was now paid towards the, positioned with the distal marker approximately 5 mm from the anterior aspect of the cortex. Appropriate positioning was confirmed on the AP projection. At this time, the balloon was expanded using contrast via a Kyphon inflation syringe device via micro tubing. Inflations were continued under direct fluoroscopic guidance. At this time, methylmethacrylate mixture was reconstituted in the Kyphon bone mixing device system. This was then loaded into the delivery mechanism, attached to Kyphon bone fillers. The balloons were deflated and removed followed by the instillation of methylmethacrylate mixture with excellent filling in the AP and lateral projections. The working cannulae and the bone filler were  then retrieved and removed. Multiple spot radiographic images were obtained in various obliquities. Hemostasis was achieved with manual compression. The patient tolerated the procedure well without immediate postprocedural complication. FINDINGS: *Bipedicular  L2 bone biopsy with adequate tissue retrieval. This was sent in formalin to pathology for analysis. *Adequate cement filling of the L2 vertebral body on both the AP and lateral projections. A total of 5 mL PMMA was injected. *Superior endplate discontinuity with small volume extravasation into the L1-L2 disc space. *No epidural venous extravasation or additional extravasation noted posteriorly into the spinal canal or along the pedicular access. IMPRESSION: Successful L2 vertebral body biopsy, radiofrequency OsteoCool ablation and bi-pedicular cement augmentation with balloon kyphoplasty. Thom Hall, MD [facility] Electronically Signed   By: Thom Hall M.D.   On: 10/16/2024 17:50     ASSESSMENT/PLAN:  This is a very pleasant 59 years old African-American male recently diagnosed with a stage IV (T3, N3, M1 C) non-small cell lung cancer, adenocarcinoma presented with large right upper lobe lung mass in addition to right hilar and mediastinal as well as right supraclavicular lymphadenopathy and metastatic bone lesions including the left proximal humerus, L2 as well as sacral area and left adrenal metastasis diagnosed in June 2025.   His molecular studies show no actionable mutations. He has high TMB. 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   The patient is currently on chemotherapy at a reduced dose with carboplatin  for an AUC of 4 and Alimta  400 mg/m, and Libtayo  IV every 3 weeks.  He is status post 7 cycles of treatment.  Starting from cycle #5 the patient started maintenance treatment with Alimta  400 mg/m and Libtayo  IV every 3 weeks.   The patient was recently found to have additional sites of  brain metastasis as well as some progressive disease to the bones.   The patient recently had a restaging CT scan.  The patient was seen with Dr. Sherrod today.  Dr. Sherrod personally and independently reviewed the scan.  The scan also showed a new adrenal nodule and new/enlarging low right internal jugular/right supraclavicular lymph nodes.    Dr. Sherrod previously had a lengthy discussion with the patient today about his current condition and treatment options.  The patient does have some significant intolerance to chemotherapy with cytopenias.  However, the patient does want to continue to try alternative chemotherapy regimens.  He is expected to start docetaxel  and Cyramza  today.  Dr. Sherrod had reduced his dose of docetaxel  to 65 mg/m.   He had a kyphoplasty on 12/23. Per Dr. Sherrod ok to proceed with cyramza .   He will continue to follow with palliative care.  He forgot to take his pain medication today.  He is scheduled to see them today.  We will reach out to them to see if they could administer pain medication while he is here today.  He does not drive to his appointments.  Sent him nystatin  for his oral thrush which could be causing dry mouth and taste alterations.  If he continues to have significant oral thrush we can consider sending him Diflucan .  His creatinine is elevated today.  His mouth appears dry.  We will administer 1 L of fluid over 2 hours due to dehydration. We also discussed drinking protein supplemental drinks and hydrating with water.  We will see him back and monitor his labs closely on a weekly basis as the patient does typically experience cytopenias with treatment.  He previously has required transfusion support.  His hemoglobin is 10.2 today.  He does not need a blood transfusion at this time but I do have standing orders in for sample blood bank.  We would consider  blood transfusion if his hemoglobin were less than 8.  I do want to see the patient back next  week for a lab visit and toxicity check.  I have included this on my checkout instructions.  He will continue taking Remeron  for his appetite.  He underwent SRS to the progressive metastatic disease to the brain which was completed on 09/07/2024.  He did review how to take his steroids for his premedications.  He did forget to take it yesterday and today but he will take it upon returning home and ensure he takes it tomorrow as prescribed.  He also will use Claritin the factor injections later this week.  I did him a copy of his schedule.  The patient was advised to call immediately if she has any concerning symptoms in the interval. The patient voices understanding of current disease status and treatment options and is in agreement with the current care plan. All questions were answered. The patient knows to call the clinic with any problems, questions or concerns. We can certainly see the patient much sooner if necessary      No orders of the defined types were placed in this encounter.    The total time spent in the appointment was 30-39 minutes  Purvi Ruehl L Miata Culbreth, PA-C 10/29/2024

## 2024-10-29 ENCOUNTER — Other Ambulatory Visit (HOSPITAL_COMMUNITY): Payer: Self-pay

## 2024-10-29 ENCOUNTER — Inpatient Hospital Stay: Attending: Physician Assistant | Admitting: Physician Assistant

## 2024-10-29 ENCOUNTER — Encounter: Payer: Self-pay | Admitting: Nurse Practitioner

## 2024-10-29 ENCOUNTER — Inpatient Hospital Stay

## 2024-10-29 ENCOUNTER — Inpatient Hospital Stay (HOSPITAL_BASED_OUTPATIENT_CLINIC_OR_DEPARTMENT_OTHER): Admitting: Nurse Practitioner

## 2024-10-29 ENCOUNTER — Other Ambulatory Visit: Payer: Self-pay | Admitting: Physician Assistant

## 2024-10-29 VITALS — BP 130/83 | HR 108 | Temp 97.4°F | Resp 17 | Ht 73.0 in | Wt 164.0 lb

## 2024-10-29 VITALS — BP 130/82 | HR 90 | Temp 98.0°F | Resp 18

## 2024-10-29 DIAGNOSIS — R7989 Other specified abnormal findings of blood chemistry: Secondary | ICD-10-CM | POA: Diagnosis not present

## 2024-10-29 DIAGNOSIS — Z5189 Encounter for other specified aftercare: Secondary | ICD-10-CM | POA: Insufficient documentation

## 2024-10-29 DIAGNOSIS — R432 Parageusia: Secondary | ICD-10-CM | POA: Insufficient documentation

## 2024-10-29 DIAGNOSIS — Z5112 Encounter for antineoplastic immunotherapy: Secondary | ICD-10-CM | POA: Insufficient documentation

## 2024-10-29 DIAGNOSIS — K117 Disturbances of salivary secretion: Secondary | ICD-10-CM | POA: Diagnosis not present

## 2024-10-29 DIAGNOSIS — R1310 Dysphagia, unspecified: Secondary | ICD-10-CM | POA: Insufficient documentation

## 2024-10-29 DIAGNOSIS — Z5111 Encounter for antineoplastic chemotherapy: Secondary | ICD-10-CM | POA: Diagnosis not present

## 2024-10-29 DIAGNOSIS — C3491 Malignant neoplasm of unspecified part of right bronchus or lung: Secondary | ICD-10-CM | POA: Diagnosis not present

## 2024-10-29 DIAGNOSIS — Z79899 Other long term (current) drug therapy: Secondary | ICD-10-CM | POA: Insufficient documentation

## 2024-10-29 DIAGNOSIS — G4700 Insomnia, unspecified: Secondary | ICD-10-CM

## 2024-10-29 DIAGNOSIS — C7972 Secondary malignant neoplasm of left adrenal gland: Secondary | ICD-10-CM | POA: Insufficient documentation

## 2024-10-29 DIAGNOSIS — Z515 Encounter for palliative care: Secondary | ICD-10-CM | POA: Diagnosis not present

## 2024-10-29 DIAGNOSIS — Z87891 Personal history of nicotine dependence: Secondary | ICD-10-CM | POA: Diagnosis not present

## 2024-10-29 DIAGNOSIS — R630 Anorexia: Secondary | ICD-10-CM | POA: Diagnosis not present

## 2024-10-29 DIAGNOSIS — R634 Abnormal weight loss: Secondary | ICD-10-CM

## 2024-10-29 DIAGNOSIS — C3411 Malignant neoplasm of upper lobe, right bronchus or lung: Secondary | ICD-10-CM | POA: Diagnosis present

## 2024-10-29 DIAGNOSIS — G893 Neoplasm related pain (acute) (chronic): Secondary | ICD-10-CM

## 2024-10-29 DIAGNOSIS — C7951 Secondary malignant neoplasm of bone: Secondary | ICD-10-CM | POA: Insufficient documentation

## 2024-10-29 DIAGNOSIS — Z79633 Long term (current) use of mitotic inhibitor: Secondary | ICD-10-CM | POA: Insufficient documentation

## 2024-10-29 LAB — CMP (CANCER CENTER ONLY)
ALT: 6 U/L (ref 0–44)
AST: 28 U/L (ref 15–41)
Albumin: 4 g/dL (ref 3.5–5.0)
Alkaline Phosphatase: 70 U/L (ref 38–126)
Anion gap: 14 (ref 5–15)
BUN: 18 mg/dL (ref 6–20)
CO2: 22 mmol/L (ref 22–32)
Calcium: 9.8 mg/dL (ref 8.9–10.3)
Chloride: 102 mmol/L (ref 98–111)
Creatinine: 1.61 mg/dL — ABNORMAL HIGH (ref 0.61–1.24)
GFR, Estimated: 49 mL/min — ABNORMAL LOW
Glucose, Bld: 88 mg/dL (ref 70–99)
Potassium: 3.8 mmol/L (ref 3.5–5.1)
Sodium: 138 mmol/L (ref 135–145)
Total Bilirubin: 0.4 mg/dL (ref 0.0–1.2)
Total Protein: 9.2 g/dL — ABNORMAL HIGH (ref 6.5–8.1)

## 2024-10-29 LAB — CBC WITH DIFFERENTIAL (CANCER CENTER ONLY)
Abs Immature Granulocytes: 0.01 K/uL (ref 0.00–0.07)
Basophils Absolute: 0 K/uL (ref 0.0–0.1)
Basophils Relative: 1 %
Eosinophils Absolute: 0.4 K/uL (ref 0.0–0.5)
Eosinophils Relative: 8 %
HCT: 30.6 % — ABNORMAL LOW (ref 39.0–52.0)
Hemoglobin: 10.2 g/dL — ABNORMAL LOW (ref 13.0–17.0)
Immature Granulocytes: 0 %
Lymphocytes Relative: 36 %
Lymphs Abs: 1.9 K/uL (ref 0.7–4.0)
MCH: 27.9 pg (ref 26.0–34.0)
MCHC: 33.3 g/dL (ref 30.0–36.0)
MCV: 83.8 fL (ref 80.0–100.0)
Monocytes Absolute: 0.5 K/uL (ref 0.1–1.0)
Monocytes Relative: 10 %
Neutro Abs: 2.4 K/uL (ref 1.7–7.7)
Neutrophils Relative %: 45 %
Platelet Count: 182 K/uL (ref 150–400)
RBC: 3.65 MIL/uL — ABNORMAL LOW (ref 4.22–5.81)
RDW: 15.5 % (ref 11.5–15.5)
WBC Count: 5.3 K/uL (ref 4.0–10.5)
nRBC: 0 % (ref 0.0–0.2)

## 2024-10-29 LAB — TSH: TSH: 0.349 u[IU]/mL — ABNORMAL LOW (ref 0.350–4.500)

## 2024-10-29 LAB — SAMPLE TO BLOOD BANK

## 2024-10-29 LAB — TOTAL PROTEIN, URINE DIPSTICK: Protein, ur: 30 mg/dL — AB

## 2024-10-29 MED ORDER — SODIUM CHLORIDE 0.9 % IV SOLN
65.0000 mg/m2 | Freq: Once | INTRAVENOUS | Status: AC
  Administered 2024-10-29: 131 mg via INTRAVENOUS
  Filled 2024-10-29: qty 13.1

## 2024-10-29 MED ORDER — DIPHENHYDRAMINE HCL 50 MG/ML IJ SOLN
50.0000 mg | Freq: Once | INTRAMUSCULAR | Status: AC
  Administered 2024-10-29: 50 mg via INTRAVENOUS
  Filled 2024-10-29: qty 1

## 2024-10-29 MED ORDER — NYSTATIN 100000 UNIT/ML MT SUSP
5.0000 mL | Freq: Four times a day (QID) | OROMUCOSAL | 0 refills | Status: AC
  Filled 2024-10-29: qty 60, 3d supply, fill #0

## 2024-10-29 MED ORDER — SODIUM CHLORIDE 0.9 % IV SOLN: INTRAVENOUS | Status: DC

## 2024-10-29 MED ORDER — OXYCODONE HCL 5 MG PO TABS
10.0000 mg | ORAL_TABLET | Freq: Once | ORAL | Status: AC
  Administered 2024-10-29: 10 mg via ORAL
  Filled 2024-10-29: qty 2

## 2024-10-29 MED ORDER — ACETAMINOPHEN 325 MG PO TABS
650.0000 mg | ORAL_TABLET | Freq: Once | ORAL | Status: AC
  Administered 2024-10-29: 650 mg via ORAL
  Filled 2024-10-29: qty 2

## 2024-10-29 MED ORDER — SODIUM CHLORIDE 0.9 % IV SOLN
10.0000 mg/kg | Freq: Once | INTRAVENOUS | Status: AC
  Administered 2024-10-29: 800 mg via INTRAVENOUS
  Filled 2024-10-29: qty 30

## 2024-10-29 MED ORDER — DEXAMETHASONE SOD PHOSPHATE PF 10 MG/ML IJ SOLN
10.0000 mg | Freq: Once | INTRAMUSCULAR | Status: AC
  Administered 2024-10-29: 10 mg via INTRAVENOUS

## 2024-10-29 MED ORDER — SODIUM CHLORIDE 0.9 % IV SOLN: Freq: Once | INTRAVENOUS | Status: AC

## 2024-10-29 MED ORDER — OXYCODONE-ACETAMINOPHEN 5-325 MG PO TABS: 1.0000 | ORAL_TABLET | Freq: Four times a day (QID) | ORAL | 0 refills | Status: DC | PRN

## 2024-10-29 NOTE — Progress Notes (Signed)
 "    Palliative Medicine [facility]  [phone] [phone]   Name: Benjamin Herrera Date: 10/29/2024 MRN: [id number]  DOB: 1965-12-25  Patient Care Team: [name], MD as PCP - General (Family Medicine) Eben Reyes [name], MD as PCP - Infectious Diseases (Infectious Diseases) Mona Vinie [name], MD as PCP - Cardiology (Cardiology) Debrah Lamar [name], MD (Inactive) as Consulting Physician (Gastroenterology) [name], MD as Consulting Physician (General Surgery) [name], RN as Oncology Nurse Navigator    INTERVAL HISTORY: Benjamin Herrera is a 59 y.o. male with medical problems include: HIV, hypertension, coronary artery disease (hx MI, status post DES to OM1 in 2017), chronic low back pain, SCC of skin, OSA, polysubstance abuse, GERD, asthma, anxiety, arthritis, more recently he was diagnosed with Adenocarcinoma of right lung, stage 4.   SOCIAL HISTORY:     reports that he has quit smoking. His smoking use included cigarettes. He started smoking about 43 years ago. He has a 43 pack-year smoking history. He has never used smokeless tobacco. He reports that he does not currently use drugs after having used the following drugs: Marijuana. Frequency: 3.00 times per week. He reports that he does not drink alcohol.  ADVANCE DIRECTIVES:  Full code full scope. Plans to meet with social work to establish advance directives. Referral to social work for this placed at initial visit. Pending completion of forms.   CODE STATUS: Full code  PAST MEDICAL HISTORY: Past Medical History:  Diagnosis Date   Achalasia and cardiospasm    Allergy    Anxiety    Arthritis    back ,shoulders    Asthma    Boils    under arms hx of   CAD (coronary artery disease)    a. 09/2016: cath showing 100% RCA occlusion with collaterals and 90% OM1 (treated with DES)   Candidiasis of mouth    Candidiasis of unspecified site    Cholelithiasis    Cocaine abuse (HCC)     Quit 09/2016   Condyloma acuminatum in male of scrotum & anal canal s/p laser ablation 07/03/2012   Coronary artery disease    Dysphagia    Fracture, humerus    Frozen shoulder    left   GERD (gastroesophageal reflux disease)    Heart attack (HCC)    Hemorrhoids    Hepatitis 1993   A   Herpes labialis    HIV (human immunodeficiency virus infection) (HCC) DX 1993   Hypertension Dx 2014   off all bp meds for last 9 or 10 months   Pilonidal disease 01/10/2012   Polysubstance abuse (HCC)    Psoriasis    hx of   Sleep apnea    does  have CPAP   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  MEDICATIONS:  Current Outpatient Medications  Medication Sig Dispense Refill   acetaminophen  (TYLENOL ) 325 MG tablet Take 650 mg by mouth every 6 (six) hours as needed for mild pain (pain score 1-3), moderate pain (pain score 4-6) or headache.     albuterol  (VENTOLIN  HFA) 108 (90 Base) MCG/ACT inhaler Inhale 2 puffs into the lungs every 6 (six) hours as needed for wheezing or shortness of breath. 1 each 2   amLODipine  (NORVASC ) 5 MG tablet Take 1 tablet (5 mg total) by mouth daily. (  Patient not taking: Reported on 10/29/2024) 90 tablet 1   amoxicillin -clavulanate (AUGMENTIN ) 875-125 MG tablet Take 1 tablet by mouth 2 (two) times daily. 20 tablet 0   aspirin  81 MG chewable tablet Chew 1 tablet (81 mg total) by mouth daily. 90 tablet 3   Bictegravir-Emtricitab-Tenofov (BIKTARVY ) 30-120-15 MG TABS Take 1 tablet by mouth daily. 90 tablet 3   Blood Pressure KIT 1 kit by Does not apply route daily. 1 kit 0   dexamethasone  (DECADRON ) 4 MG tablet Take TWO tablets TWICE a day the day before chemotherapy, the day of chemotherapy, and the day after chemotherapy 40 tablet 2   diclofenac  Sodium (VOLTAREN  ARTHRITIS PAIN) 1 % GEL Apply 2 g topically 4 (four)  times daily. 100 g 3   hydrOXYzine  (ATARAX ) 25 MG tablet Take 1 tablet (25 mg total) by mouth 3 (three) times daily as needed. 30 tablet 0   lidocaine  (XYLOCAINE ) 2 % solution SWISH AND SWALLOW 5 MLS 3 (THREE) TIMES DAILY AS NEEDED FOR MOUTH PAIN. 360 mL 1   LORazepam  (ATIVAN ) 0.5 MG tablet TAKE 1 TAB BY MOUTH 30 MINUTES PRIOR TO RADIATION OR MRI SCANS 4 tablet 0   magic mouthwash (nystatin , diphenhydrAMINE , alum & mag hydroxide) suspension mixture Take 5 mLs by mouth 3 (three) times daily as needed for mouth pain. 210 mL 1   methocarbamol  (ROBAXIN ) 500 MG tablet Take 2 tablets (1,000 mg total) by mouth every 8 (eight) hours as needed. 90 tablet 1   metoprolol  succinate (TOPROL -XL) 25 MG 24 hr tablet TAKE 1/2 TABLET BY MOUTH DAILY 45 tablet 0   mirtazapine  (REMERON ) 15 MG tablet TAKE 1 TABLET BY MOUTH EVERYDAY AT BEDTIME 90 tablet 1   nitroGLYCERIN  (NITROSTAT ) 0.4 MG SL tablet Place 1 tablet (0.4 mg total) under the tongue every 5 (five) minutes as needed for chest pain (trouble swallowing). 25 tablet 1   nystatin  (MYCOSTATIN ) 100000 UNIT/ML suspension Take 5 mLs by mouth 4 (four) times daily. 60 mL 0   oxyCODONE -acetaminophen  (PERCOCET/ROXICET) 5-325 MG tablet Take 1 tablet by mouth every 6 (six) hours as needed for severe pain (pain score 7-10). 60 tablet 0   potassium chloride  SA (KLOR-CON  M) 20 MEQ tablet Take 1 tablet (20 mEq total) by mouth daily. 6 tablet 0   predniSONE  (DELTASONE ) 50 MG tablet Take one tablet 13 hours before , one tablet 7 hours before and one tablet one hour prior to CT scan. Take benadryl  50 mg one hour prior to scan 3 tablet 0   prochlorperazine  (COMPAZINE ) 10 MG tablet Take 1 tablet (10 mg total) by mouth every 6 (six) hours as needed. 30 tablet 2   sulfamethoxazole -trimethoprim  (BACTRIM  DS) 800-160 MG tablet Take 1 tablet by mouth 3 (three) times a week. 36 tablet 3   No current facility-administered medications for this visit.   Facility-Administered Medications  Ordered in Other Visits  Medication Dose Route Frequency Provider Last Rate Last Admin   0.9 %  sodium chloride  infusion   Intravenous Continuous Sherrod Sherrod, MD 10 mL/hr at 10/29/24 1119 New Bag at 10/29/24 1119   DOCEtaxel  (TAXOTERE ) 131 mg in sodium chloride  0.9 % 250 mL chemo infusion  65 mg/m2 (Treatment Plan Recorded) Intravenous Once Mohamed, Mohamed, MD       ramucirumab  (CYRAMZA ) 800 mg in sodium chloride  0.9 % 170 mL chemo infusion  10 mg/kg (Treatment Plan Recorded) Intravenous Once Sherrod Sherrod, MD        VITAL SIGNS: There were no vitals taken for this  visit. There were no vitals filed for this visit.  Estimated body mass index is 21.64 kg/m as calculated from the following:   Height as of an earlier encounter on 10/29/24: 6' 1 (1.854 m).   Weight as of an earlier encounter on 10/29/24: 164 lb (74.4 kg).  PERFORMANCE STATUS (ECOG) : 1 - Symptomatic but completely ambulatory  Physical Exam General: Appears uncomfortable in recliner, complaining of pain Cardiovascular: regular rate and rhythm Pulmonary: normal breathing pattern Extremities: no edema, no joint deformities Skin: no rashes Neurological: AAO x3  IMPRESSION: Discussed the use of AI scribe software for clinical note transcription with the patient, who gave verbal consent to proceed.  History of Present Illness CLESTON LAUTNER is a 59 year old male who I saw during infusion for symptom management follow-up.  He has a friend present. No issues with nausea, vomiting, constipation or diarrhea.  Appetite fluctuates, some days are better than others. States when he is having pain he has minimal appetite. Ongoing fatigue however tries to remain active.    He is experiencing aching pain following an osteocool kyphoplasty procedure performed on December 23rd, current pain rated as a 4 out of 10 on the pain scale. The pain worsens with movement, particularly when standing up from a lying position, and sitting up for  extended periods also contributes to discomfort. States pain then intensifies to a 9 out of 10 or higher.   Encouraged him to contact IR for follow-up post procedure.   His pain has been managed with oxycodone  5mg  as needed but did not take it on the morning of the visit due to being rushed by his transportation system. He has been using Tylenol  as part of his pain management regimen. We will administer oxycodone  10mg  while in clinic for pain. Current home regimen is effective. No adjustments at this time.   Duquan has been connected with [facility] for ongoing support.   We will continue to closely monitor and support.   Assessment & Plan Chronic arm/Back pain  Patient reports ongoing lower back pain that radiates. Pain worsens with movement or certain activities. He is s/p Osteocool/Kyphoplasty on December 23rd. Postprocedural pain rated at 4/10, described as aching, exacerbated by movement. Pain is present when sitting up or standing, but not when lying down. Pain management is ongoing with Tylenol  and pain medication as needed. Unsure of follow-up. Encouraged to contact provider's office for required follow-up. No adjustments to regimen at this time.  - Continue oxycodone  5mg  every 6 hours as needed.  - UDS every other visit. Due on 11/19/24. -Oxycodone  IR 10mg  during infusion.  -Continue to monitor pain levels and adjust regimen as indicated - Will follow up with the surgical team for post procedure  Goals of Care Patient emotional expressing he was told today that his cancer is progressing and coming to limited treatment options.  States his wishes are to continue to treat the treatable allow him every opportunity to continue to live for as long as he can as he is not ready to give up at this time.  He is open to pursuing treatments with understanding they are what palliative life-prolonging intent. - Will continue to closely monitor and support offered emotional comfort. - Ongoing goals  of care discussions. -Will connect patient with social work support.   I will plan to see patient back in 2-3 weeks. Sooner if needed.   Patient expressed understanding and was in agreement with this plan. He also understands that He  can call the clinic at any time with any questions, concerns, or complaints.   Any controlled substances utilized were prescribed in the context of palliative care. PDMP has been reviewed.   I personally spent a total of 40 minutes in the care of the patient today including preparing to see the patient, getting/reviewing separately obtained history, performing a medically appropriate exam/evaluation, counseling and educating, placing orders, referring and communicating with other health care professionals, documenting clinical information in the EHR, and coordinating care. Visit consisted of counseling and education dealing with the complex and emotionally intense issues of symptom management and palliative care in the setting of serious and potentially life-threatening illness.  Levon Borer, AGPCNP-BC  Palliative Medicine Team/[facility]    "

## 2024-10-30 ENCOUNTER — Inpatient Hospital Stay: Admitting: Licensed Clinical Social Worker

## 2024-10-30 DIAGNOSIS — C3491 Malignant neoplasm of unspecified part of right bronchus or lung: Secondary | ICD-10-CM

## 2024-10-30 LAB — T4: T4, Total: 11 ug/dL (ref 4.5–12.0)

## 2024-10-30 NOTE — Progress Notes (Signed)
 [facility] CSW Progress Note   Interventions: Referral sent to [employer] for financial resources on behalf of pt.        Follow Up Plan:  Patient will contact CSW with any support or resource needs    Devere JONELLE Manna, LCSW Clinical Social Worker [facility]    Patient is participating in a Managed Medicaid Plan:  Yes

## 2024-10-31 ENCOUNTER — Ambulatory Visit: Admitting: Family Medicine

## 2024-10-31 ENCOUNTER — Inpatient Hospital Stay

## 2024-10-31 VITALS — BP 139/82 | HR 104 | Resp 17

## 2024-10-31 DIAGNOSIS — Z5111 Encounter for antineoplastic chemotherapy: Secondary | ICD-10-CM | POA: Diagnosis not present

## 2024-10-31 DIAGNOSIS — C3491 Malignant neoplasm of unspecified part of right bronchus or lung: Secondary | ICD-10-CM

## 2024-10-31 MED ORDER — PEGFILGRASTIM-CBQV 6 MG/0.6ML ~~LOC~~ SOSY
6.0000 mg | PREFILLED_SYRINGE | Freq: Once | SUBCUTANEOUS | Status: AC
  Administered 2024-10-31: 6 mg via SUBCUTANEOUS
  Filled 2024-10-31: qty 0.6

## 2024-11-02 ENCOUNTER — Other Ambulatory Visit: Payer: Self-pay | Admitting: Radiation Therapy

## 2024-11-02 DIAGNOSIS — C7931 Secondary malignant neoplasm of brain: Secondary | ICD-10-CM

## 2024-11-06 ENCOUNTER — Inpatient Hospital Stay

## 2024-11-06 ENCOUNTER — Inpatient Hospital Stay: Admitting: Internal Medicine

## 2024-11-06 VITALS — BP 105/80 | HR 125 | Temp 98.2°F | Resp 17 | Ht 73.0 in | Wt 161.0 lb

## 2024-11-06 DIAGNOSIS — Z5111 Encounter for antineoplastic chemotherapy: Secondary | ICD-10-CM | POA: Diagnosis not present

## 2024-11-06 DIAGNOSIS — C3491 Malignant neoplasm of unspecified part of right bronchus or lung: Secondary | ICD-10-CM | POA: Diagnosis not present

## 2024-11-06 LAB — CMP (CANCER CENTER ONLY)
ALT: 17 U/L (ref 0–44)
AST: 35 U/L (ref 15–41)
Albumin: 3.7 g/dL (ref 3.5–5.0)
Alkaline Phosphatase: 126 U/L (ref 38–126)
Anion gap: 17 — ABNORMAL HIGH (ref 5–15)
BUN: 20 mg/dL (ref 6–20)
CO2: 21 mmol/L — ABNORMAL LOW (ref 22–32)
Calcium: 9.2 mg/dL (ref 8.9–10.3)
Chloride: 99 mmol/L (ref 98–111)
Creatinine: 1.76 mg/dL — ABNORMAL HIGH (ref 0.61–1.24)
GFR, Estimated: 44 mL/min — ABNORMAL LOW
Glucose, Bld: 79 mg/dL (ref 70–99)
Potassium: 3.6 mmol/L (ref 3.5–5.1)
Sodium: 137 mmol/L (ref 135–145)
Total Bilirubin: 0.5 mg/dL (ref 0.0–1.2)
Total Protein: 8.6 g/dL — ABNORMAL HIGH (ref 6.5–8.1)

## 2024-11-06 LAB — CBC WITH DIFFERENTIAL (CANCER CENTER ONLY)
Abs Immature Granulocytes: 0.42 K/uL — ABNORMAL HIGH (ref 0.00–0.07)
Basophils Absolute: 0.1 K/uL (ref 0.0–0.1)
Basophils Relative: 1 %
Eosinophils Absolute: 0.1 K/uL (ref 0.0–0.5)
Eosinophils Relative: 1 %
HCT: 33.2 % — ABNORMAL LOW (ref 39.0–52.0)
Hemoglobin: 10.9 g/dL — ABNORMAL LOW (ref 13.0–17.0)
Immature Granulocytes: 4 %
Lymphocytes Relative: 27 %
Lymphs Abs: 3 K/uL (ref 0.7–4.0)
MCH: 27.5 pg (ref 26.0–34.0)
MCHC: 32.8 g/dL (ref 30.0–36.0)
MCV: 83.8 fL (ref 80.0–100.0)
Monocytes Absolute: 1.1 K/uL — ABNORMAL HIGH (ref 0.1–1.0)
Monocytes Relative: 10 %
Neutro Abs: 6.3 K/uL (ref 1.7–7.7)
Neutrophils Relative %: 57 %
Platelet Count: 79 K/uL — ABNORMAL LOW (ref 150–400)
RBC: 3.96 MIL/uL — ABNORMAL LOW (ref 4.22–5.81)
RDW: 16 % — ABNORMAL HIGH (ref 11.5–15.5)
Smear Review: NORMAL
WBC Count: 11 K/uL — ABNORMAL HIGH (ref 4.0–10.5)
nRBC: 0 % (ref 0.0–0.2)

## 2024-11-06 LAB — SAMPLE TO BLOOD BANK

## 2024-11-06 MED ORDER — METHYLPREDNISOLONE 4 MG PO TBPK: ORAL_TABLET | ORAL | 0 refills | Status: AC

## 2024-11-06 NOTE — Progress Notes (Signed)
 "     [facility] Telephone:([phone]) [phone]   [phone]  OFFICE PROGRESS NOTE  [name], MD [address] [place] [address] [place] [address]  DIAGNOSIS: Stage IV (T3, N3, M1 C) non-small cell lung cancer, adenocarcinoma presented with large right upper lobe lung mass in addition to right hilar and mediastinal as well as right supraclavicular lymphadenopathy and metastatic bone lesions including the left proximal humerus, L2 as well as sacral area and left adrenal metastasis diagnosed in June 2025.   Molecular studies show PDL1 of 0%   Biomarker Findings Blood Tumor Mutational Burden - 37 Muts/Mb ctDNA Tumor Fraction - High (2.4%) Microsatellite status - MSI-High Not Detected Genomic Findings For a complete list of the genes assayed, please refer to the Appendix. ARID1A E2250* KEAP1 G524C, G455fs*23 CTNNA1 splice site 2192+1G>C DNMT3A R882C TET2 K1197* TP53 splice site 813_[id number], deletion exons 8-9 This assay tested >300 cancer-related genes, including the following 8 gene(s) routinely assessed in this tumor type: ALK, BRAF, EGFR, ERBB2, KRAS, MET, RET, ROS1.     PRIOR THERAPY:   1) Palliative radiation to the painful bone lesion in the left humerus. Last day on 05/03/24.  2) Systemic chemotherapy with Carboplatin  for an AUC of 5, Alimta  500 mg/m2, and Libatyo IV every 3 weeks. First dose on 05/02/24. Doses of Alimta  reduced to 400 mg/m2 starting from cycle #2 due to HIV and neutropenia.  Status post 7 cycles.  Starting cycle #5 the patient is on treatment with maintenance Alimta  and Libtayo  (Cempilimab) every 3 weeks.  This treatment was discontinued secondary to disease progression.   CURRENT THERAPY: Second line systemic chemotherapy with reduced dose docetaxel  65 mg/M2 and ramucirumab  10 mg/KG every 3 weeks with Neulasta  support.  First dose was given October 29, 2024.  Status post 1 cycle.  INTERVAL HISTORY: Benjamin Herrera 59 y.o. male  returns to the clinic today for follow-up visit.  His cousin was available by phone during the visit.Discussed the use of AI scribe software for clinical note transcription with the patient, who gave verbal consent to proceed.  History of Present Illness Benjamin Herrera is a 59 year old male with stage 4 non-small cell lung cancer (adenocarcinoma) and HIV who presents for follow-up regarding cancer-related anorexia, oral dryness, and weight loss.  Benjamin Herrera was diagnosed with stage 4 non-small cell lung cancer, adenocarcinoma, in June 2025, with no actionable mutations and negative PD-L1. He initiated palliative systemic chemoimmunotherapy with carboplatin , pemetrexed , and cemiplimab  in July 2025, receiving four cycles every three weeks, followed by three cycles of maintenance therapy. He is currently in the first week of a new treatment cycle, which he has tolerated well aside from transient arm pain following a white blood cell booster injection; this resolved with a single dose of oral medication.  He experiences persistent hunger but is unable to eat due to anorexia. When attempting oral intake, he notes oral dryness, difficulty chewing, and impaired swallowing, which he attributes to decreased salivary production. He frequently drinks water and ginger ale and uses Boost nutritional supplement, but this causes pruritus in his throat and ears. He has tried the sports coach and has lost three additional pounds recently. He also reports dysgeusia. He denies nausea or vomiting. He expresses concern regarding his inability to eat despite persistent hunger.  He continues to take dexamethasone  as part of his chemotherapy regimen, two tablets in the morning and two in the evening, the day before, the day of, and the day  after treatment.  HIV is present but without new symptoms or complications discussed during this visit.    MEDICAL HISTORY: Past Medical History:  Diagnosis Date   Achalasia and  cardiospasm    Allergy    Anxiety    Arthritis    back ,shoulders    Asthma    Boils    under arms hx of   CAD (coronary artery disease)    a. 09/2016: cath showing 100% RCA occlusion with collaterals and 90% OM1 (treated with DES)   Candidiasis of mouth    Candidiasis of unspecified site    Cholelithiasis    Cocaine abuse (HCC)    Quit 09/2016   Condyloma acuminatum in male of scrotum & anal canal s/p laser ablation 07/03/2012   Coronary artery disease    Dysphagia    Fracture, humerus    Frozen shoulder    left   GERD (gastroesophageal reflux disease)    Heart attack (HCC)    Hemorrhoids    Hepatitis 1993   A   Herpes labialis    HIV (human immunodeficiency virus infection) (HCC) DX 1993   Hypertension Dx 2014   off all bp meds for last 9 or 10 months   Pilonidal disease 01/10/2012   Polysubstance abuse (HCC)    Psoriasis    hx of   Sleep apnea    does  have CPAP   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  MEDICATIONS:  Current Outpatient Medications  Medication Sig Dispense Refill   acetaminophen  (TYLENOL ) 325 MG tablet Take 650 mg by mouth every 6 (six) hours as needed for mild pain (pain score 1-3), moderate pain (pain score 4-6) or headache.     albuterol  (VENTOLIN  HFA) 108 (90 Base) MCG/ACT inhaler Inhale 2 puffs into the lungs every 6 (six) hours as needed for wheezing or shortness of breath. 1 each 2   amLODipine  (NORVASC ) 5 MG tablet Take 1 tablet (5 mg total) by mouth daily. (Patient not taking: Reported on 10/29/2024) 90 tablet 1   amoxicillin -clavulanate (AUGMENTIN ) 875-125 MG tablet Take 1 tablet by mouth 2 (two) times daily. 20 tablet 0   aspirin  81 MG chewable tablet Chew 1 tablet (81 mg total) by mouth daily. 90 tablet 3   Bictegravir-Emtricitab-Tenofov (BIKTARVY ) 30-120-15 MG TABS Take 1 tablet by  mouth daily. 90 tablet 3   Blood Pressure KIT 1 kit by Does not apply route daily. 1 kit 0   dexamethasone  (DECADRON ) 4 MG tablet Take TWO tablets TWICE a day the day before chemotherapy, the day of chemotherapy, and the day after chemotherapy 40 tablet 2   diclofenac  Sodium (VOLTAREN  ARTHRITIS PAIN) 1 % GEL Apply 2 g topically 4 (four) times daily. 100 g 3   hydrOXYzine  (ATARAX ) 25 MG tablet Take 1 tablet (25 mg total) by mouth 3 (three) times daily as needed. 30 tablet 0   lidocaine  (XYLOCAINE ) 2 % solution SWISH AND SWALLOW 5 MLS 3 (THREE) TIMES DAILY AS NEEDED FOR MOUTH PAIN. 360 mL 1   LORazepam  (ATIVAN ) 0.5 MG tablet TAKE 1 TAB BY MOUTH 30 MINUTES PRIOR TO RADIATION OR MRI SCANS 4 tablet 0   magic mouthwash (nystatin , diphenhydrAMINE , alum & mag hydroxide) suspension mixture Take 5 mLs by mouth 3 (three) times daily as needed for mouth pain. 210 mL 1  methocarbamol  (ROBAXIN ) 500 MG tablet Take 2 tablets (1,000 mg total) by mouth every 8 (eight) hours as needed. 90 tablet 1   metoprolol  succinate (TOPROL -XL) 25 MG 24 hr tablet TAKE 1/2 TABLET BY MOUTH DAILY 45 tablet 0   mirtazapine  (REMERON ) 15 MG tablet TAKE 1 TABLET BY MOUTH EVERYDAY AT BEDTIME 90 tablet 1   nitroGLYCERIN  (NITROSTAT ) 0.4 MG SL tablet Place 1 tablet (0.4 mg total) under the tongue every 5 (five) minutes as needed for chest pain (trouble swallowing). 25 tablet 1   nystatin  (MYCOSTATIN ) 100000 UNIT/ML suspension Take 5 mLs by mouth 4 (four) times daily. 60 mL 0   oxyCODONE -acetaminophen  (PERCOCET/ROXICET) 5-325 MG tablet Take 1 tablet by mouth every 6 (six) hours as needed for severe pain (pain score 7-10). 60 tablet 0   potassium chloride  SA (KLOR-CON  M) 20 MEQ tablet Take 1 tablet (20 mEq total) by mouth daily. 6 tablet 0   predniSONE  (DELTASONE ) 50 MG tablet Take one tablet 13 hours before , one tablet 7 hours before and one tablet one hour prior to CT scan. Take benadryl  50 mg one hour prior to scan 3 tablet 0    prochlorperazine  (COMPAZINE ) 10 MG tablet Take 1 tablet (10 mg total) by mouth every 6 (six) hours as needed. 30 tablet 2   sulfamethoxazole -trimethoprim  (BACTRIM  DS) 800-160 MG tablet Take 1 tablet by mouth 3 (three) times a week. 36 tablet 3   No current facility-administered medications for this visit.    SURGICAL HISTORY:  Past Surgical History:  Procedure Laterality Date   BALLOON DILATION N/A 11/11/2015   Procedure: BALLOON DILATION;  Surgeon: Gustav Shila GAILS, MD;  Location: [facility];  Service: Endoscopy;  Laterality: N/A;   BOTOX  INJECTION  10/11/2012   Procedure: BOTOX  INJECTION;  Surgeon: Lamar JONETTA Aho, MD;  Location: [facility];  Service: Endoscopy;  Laterality: N/A;   BOTOX  INJECTION N/A 01/25/2013   Procedure: BOTOX  INJECTION;  Surgeon: Lamar JONETTA Aho, MD;  Location: [facility];  Service: Endoscopy;  Laterality: N/A;   BOTOX  INJECTION N/A 06/21/2013   Procedure: BOTOX  INJECTION;  Surgeon: Lamar JONETTA Aho, MD;  Location: [facility];  Service: Endoscopy;  Laterality: N/A;   BOTOX  INJECTION N/A 10/08/2013   Procedure: BOTOX  INJECTION;  Surgeon: Lamar JONETTA Aho, MD;  Location: [facility];  Service: Endoscopy;  Laterality: N/A;   BOTOX  INJECTION N/A 06/16/2015   Procedure: BOTOX  INJECTION;  Surgeon: Lamar JONETTA Aho, MD;  Location: [facility];  Service: Endoscopy;  Laterality: N/A;   BRONCHIAL BIOPSY  04/03/2024   Procedure: BRONCHOSCOPY, WITH BIOPSY;  Surgeon: Shelah Lamar RAMAN, MD;  Location: [facility];  Service: Pulmonary;;   BRONCHIAL BRUSHINGS  04/03/2024   Procedure: BRONCHOSCOPY, WITH BRUSH BIOPSY;  Surgeon: Shelah Lamar RAMAN, MD;  Location: [facility];  Service: Pulmonary;;   BRONCHIAL NEEDLE ASPIRATION BIOPSY  04/03/2024   Procedure: BRONCHOSCOPY, WITH NEEDLE ASPIRATION BIOPSY;  Surgeon: Shelah Lamar RAMAN, MD;  Location: [facility];  Service: Pulmonary;;   BRONCHOSCOPY, WITH BIOPSY USING ELECTROMAGNETIC NAVIGATION Right 04/03/2024   Procedure: BRONCHOSCOPY, WITH  BIOPSY USING ELECTROMAGNETIC NAVIGATION;  Surgeon: Shelah Lamar RAMAN, MD;  Location: [facility];  Service: Pulmonary;  Laterality: Right;   CARDIAC CATHETERIZATION N/A 10/11/2016   Procedure: Left Heart Cath and Coronary Angiography;  Surgeon: Lonni JONETTA Cash, MD;  mRCA 100% w/ L>R collaterals, OM1 90%   CARDIAC CATHETERIZATION N/A 10/11/2016   Procedure: Coronary Stent Intervention;  Surgeon: Lonni JONETTA Cash, MD;  RESOLUTE ONYX 3.5X15 DES OM1   COLONOSCOPY  05/19/2012   Procedure: COLONOSCOPY;  Surgeon: Lamar JONETTA Aho, MD;  Location: [facility];  Service: Endoscopy;  Laterality: N/A;  jill trying to contact pt to come in 0830 for 930 case, phone not accepting messages   COLONOSCOPY     ESOPHAGEAL MANOMETRY  05/29/2012   Procedure: ESOPHAGEAL MANOMETRY (EM);  Surgeon: Lamar JONETTA Aho, MD;  Location: [facility];  Service: Endoscopy;  Laterality: N/A;   ESOPHAGEAL MANOMETRY N/A 10/06/2015   Procedure: ESOPHAGEAL MANOMETRY (EM);  Surgeon: Gustav Shila GAILS, MD;  Location: [facility];  Service: Endoscopy;  Laterality: N/A;   ESOPHAGOGASTRODUODENOSCOPY  11/11/2011   Procedure: ESOPHAGOGASTRODUODENOSCOPY (EGD);  Surgeon: Lamar JONETTA Aho, MD;  Location: [facility];  Service: Endoscopy;  Laterality: N/A;  botox  injection  called Pt to change time of procedure per Dr Aho   ESOPHAGOGASTRODUODENOSCOPY  03/10/2012   Procedure: ESOPHAGOGASTRODUODENOSCOPY (EGD);  Surgeon: Lamar JONETTA Aho, MD;  Location: [facility];  Service: Endoscopy;  Laterality: N/A;   ESOPHAGOGASTRODUODENOSCOPY  10/11/2012   Procedure: ESOPHAGOGASTRODUODENOSCOPY (EGD);  Surgeon: Lamar JONETTA Aho, MD;  Location: [facility];  Service: Endoscopy;  Laterality: N/A;   ESOPHAGOGASTRODUODENOSCOPY N/A 01/25/2013   Procedure: ESOPHAGOGASTRODUODENOSCOPY (EGD);  Surgeon: Lamar JONETTA Aho, MD;  Location: [facility];  Service: Endoscopy;  Laterality: N/A;   ESOPHAGOGASTRODUODENOSCOPY N/A 06/21/2013   Procedure:  ESOPHAGOGASTRODUODENOSCOPY (EGD);  Surgeon: Lamar JONETTA Aho, MD;  Location: [facility];  Service: Endoscopy;  Laterality: N/A;   ESOPHAGOGASTRODUODENOSCOPY (EGD) WITH PROPOFOL  N/A 10/08/2013   Procedure: ESOPHAGOGASTRODUODENOSCOPY (EGD) WITH PROPOFOL ;  Surgeon: Lamar JONETTA Aho, MD;  Location: [facility];  Service: Endoscopy;  Laterality: N/A;   ESOPHAGOGASTRODUODENOSCOPY (EGD) WITH PROPOFOL  N/A 06/16/2015   Procedure: ESOPHAGOGASTRODUODENOSCOPY (EGD) WITH PROPOFOL ;  Surgeon: Lamar JONETTA Aho, MD;  Location: [facility];  Service: Endoscopy;  Laterality: N/A;   ESOPHAGOGASTRODUODENOSCOPY (EGD) WITH PROPOFOL  N/A 11/11/2015   Procedure: ESOPHAGOGASTRODUODENOSCOPY (EGD) WITH PROPOFOL ;  Surgeon: Gustav Shila GAILS, MD;  Location: [facility];  Service: Endoscopy;  Laterality: N/A;   EXAMINATION UNDER ANESTHESIA  07/24/2012   Procedure: EXAM UNDER ANESTHESIA;  Surgeon: Elspeth KYM Schultze, MD;  Location: [facility];  Service: General;  Laterality: N/A;   humeral fracture surgery Right yrs ago   IR BONE TUMOR(S)RF ABLATION  10/16/2024   IR KYPHO LUMBAR INC FX REDUCE BONE BX UNI/BIL CANNULATION INC/IMAGING  10/16/2024   IR RADIOLOGIST EVAL & MGMT  09/12/2024   PILONIDAL CYST EXCISION  07/24/2012   Procedure: CYST EXCISION PILONIDAL SIMPLE;  Surgeon: Elspeth KYM Schultze, MD;  Location: [facility];  Service: General;  Laterality: N/A;  Exam Under Anesthesia,, Excision Pilonidal Disease,    right shoulder replacement  2008   x 2   UPPER GASTROINTESTINAL ENDOSCOPY     WART FULGURATION  07/24/2012   Procedure: FULGURATION ANAL WART;  Surgeon: Elspeth KYM Schultze, MD;  Location: [facility];  Service: General;  Laterality: N/A;  excision of raphe mass    REVIEW OF SYSTEMS:  Constitutional: positive for anorexia, fatigue, and weight loss Eyes: negative Ears, nose, mouth, throat, and face: negative Respiratory: negative Cardiovascular: negative Gastrointestinal: negative Genitourinary:negative Integument/breast:  negative Hematologic/lymphatic: negative Musculoskeletal:positive for arthralgias Neurological: negative Behavioral/Psych: negative Endocrine: negative Allergic/Immunologic: negative   PHYSICAL EXAMINATION: General appearance: alert, cooperative, fatigued, and no distress Head: Normocephalic, without obvious abnormality, atraumatic Neck: no adenopathy, no JVD, supple, symmetrical, trachea midline, and thyroid  not enlarged, symmetric, no tenderness/mass/nodules Lymph nodes: Cervical, supraclavicular, and axillary nodes normal. Resp: clear to auscultation bilaterally Back: symmetric, no curvature. ROM normal. No CVA tenderness. Cardio: regular  rate and rhythm, S1, S2 normal, no murmur, click, rub or gallop GI: soft, non-tender; bowel sounds normal; no masses,  no organomegaly Extremities: extremities normal, atraumatic, no cyanosis or edema Neurologic: Alert and oriented X 3, normal strength and tone. Normal symmetric reflexes. Normal coordination and gait  ECOG PERFORMANCE STATUS: 1 - Symptomatic but completely ambulatory  Blood pressure 105/80, pulse (!) 125, temperature 98.2 F (36.8 C), temperature source Temporal, resp. rate 17, height 6' 1 (1.854 m), weight 161 lb (73 kg), SpO2 100%.  LABORATORY DATA: Lab Results  Component Value Date   WBC 5.3 10/29/2024   HGB 10.2 (L) 10/29/2024   HCT 30.6 (L) 10/29/2024   MCV 83.8 10/29/2024   PLT 182 10/29/2024      Chemistry      Component Value Date/Time   NA 138 10/29/2024 0942   NA 136 06/19/2024 1445   K 3.8 10/29/2024 0942   CL 102 10/29/2024 0942   CO2 22 10/29/2024 0942   BUN 18 10/29/2024 0942   BUN 20 06/19/2024 1445   CREATININE 1.61 (H) 10/29/2024 0942   CREATININE 0.96 07/30/2020 1437      Component Value Date/Time   CALCIUM  9.8 10/29/2024 0942   ALKPHOS 70 10/29/2024 0942   AST 28 10/29/2024 0942   ALT 6 10/29/2024 0942   BILITOT 0.4 10/29/2024 0942       RADIOGRAPHIC STUDIES:   ASSESSMENT AND PLAN:  This is a 59 years old African-American male with Stage IV (T3, N3, M1 C) non-small cell lung cancer, adenocarcinoma presented with large right upper lobe lung mass in addition to right hilar and mediastinal as well as right supraclavicular lymphadenopathy and metastatic bone lesions including the left proximal humerus, L2 as well as sacral area and left adrenal metastasis diagnosed in June 2025. Molecular studies showed no actionable mutations and has negative PD-L1 expression but high TMB He underwent systemic chemotherapy with reduced dose carboplatin  for AUC of 4 and pemetrexed  400 Mg/M2 in addition to Libtayo  (Cempilimab) 350 Mg IV every 3 weeks.  Status post 7 cycles.  Starting cycle #5 he is on maintenance treatment with Alimta  and Libtayo  every 3 weeks.  Starting from cycle #5 he has been on maintenance treatment with Alimta  and Libtayo  (Cempilimab) every 3 weeks.  This treatment was discontinued secondary to disease progression and the patient restarted second line systemic chemotherapy with reduced dose docetaxel  65 MGs/M2 and ramucirumab  10 mg/KG every 3 weeks status post 1 cycle.  He tolerated the first cycle of close treatment fairly well except for mild fatigue and lack of taste. He is also on treatment with Xgeva  for bone metastasis. Assessment and Plan Assessment & Plan Stage IV non-small cell lung cancer, adenocarcinoma Metastatic lung adenocarcinoma without actionable mutations or PD-L1 expression, managed with palliative systemic chemoimmunotherapy. He completed four cycles of induction and is now on maintenance therapy. He tolerated the first week of the new cycle well, experiencing only mild, transient injection site pain after white blood cell booster and no significant cytopenias.  Cancer-related anorexia and oral dryness Persistent anorexia, xerostomia, dysgeusia, and odynophagia, likely secondary to ongoing cancer therapy, resulting in further weight loss and intolerance to certain  nutritional supplements, impacting nutritional status. - Prescribed Medrol  Dose Pack to stimulate appetite. - Advised to maintain adequate hydration, particularly with meals, to address xerostomia and dysphagia. - Recommended trial of alternative nutritional supplements (e.g., Ensure, different flavors of Boost) to improve oral intake.  HIV disease managed by infectious disease. He was advised  to call immediately if he has any concerning symptoms in the interval.  The patient voices understanding of current disease status and treatment options and is in agreement with the current care plan.  All questions were answered. The patient knows to call the clinic with any problems, questions or concerns. We can certainly see the patient much sooner if necessary.  The total time spent in the appointment was 30 minutes including review of chart and various tests results, discussions about plan of care and coordination of care plan .  Disclaimer: This note was dictated with voice recognition software. Similar sounding words can inadvertently be transcribed and may not be corrected upon review.        "

## 2024-11-07 ENCOUNTER — Inpatient Hospital Stay

## 2024-11-07 ENCOUNTER — Inpatient Hospital Stay: Admitting: Internal Medicine

## 2024-11-07 NOTE — Progress Notes (Signed)
" °  Radiation Oncology         [phone] ________________________________  Name: Benjamin Herrera MRN: [id number]  Date of Service: 11/12/2024  DOB: 01/07/1966  Post Treatment Telephone Note  Diagnosis:  Progressive  Stage IV, cT2bN3M1c, NSCLC, adenocarcinoma of the RUL    First Treatment Date: 2024-09-03 Last Treatment Date: 2024-10-10   Plan Name: Spine_L Site: Lumbar Spine Technique: 3D Mode: Photon Dose Per Fraction: 3 Gy Prescribed Dose (Delivered / Prescribed): 30 Gy / 30 Gy Prescribed Fxs (Delivered / Prescribed): 10 / 10   Plan Name: Brain_SRS Site: Brain [id number]_[id number]_18mm Technique: SBRT/SRT-IMRT Mode: Photon Dose Per Fraction: 20 Gy Prescribed Dose (Delivered / Prescribed): 20 Gy / 20 Gy Prescribed Fxs (Delivered / Prescribed): 1 / 1   Plan Name: Pelvis_L Site: [place], Left Technique: 3D Mode: Photon Dose Per Fraction: 3 Gy Prescribed Dose (Delivered / Prescribed): 30 Gy / 30 Gy Prescribed Fxs (Delivered / Prescribed): 10 / 10    The patient was available for call today.  The patient did note fatigue during radiation. The patient did not note skin changes in the field of radiation during therapy. The patient has noticed improvement in pain in the area(s) treated with radiation. The patient is taking dexamethasone . The patient does not have symptoms of  weakness or loss of control of the extremities. The patient does not have symptoms of headache. The patient does not have symptoms of seizure or uncontrolled movement. The patient does not have symptoms of changes in vision. The patient does not have changes in speech. The patient does not have confusion.   The patient is scheduled for ongoing care with Dr. Sherrod in medical oncology. The patient was encouraged to call if he develops concerns or questions regarding radiation.   "

## 2024-11-12 ENCOUNTER — Ambulatory Visit
Admission: RE | Admit: 2024-11-12 | Discharge: 2024-11-12 | Disposition: A | Source: Ambulatory Visit | Attending: Radiation Oncology

## 2024-11-12 DIAGNOSIS — D0761 Carcinoma in situ of scrotum: Secondary | ICD-10-CM

## 2024-11-12 DIAGNOSIS — C3491 Malignant neoplasm of unspecified part of right bronchus or lung: Secondary | ICD-10-CM

## 2024-11-13 ENCOUNTER — Other Ambulatory Visit: Payer: Self-pay | Admitting: Physician Assistant

## 2024-11-13 ENCOUNTER — Inpatient Hospital Stay

## 2024-11-13 DIAGNOSIS — C3491 Malignant neoplasm of unspecified part of right bronchus or lung: Secondary | ICD-10-CM

## 2024-11-13 DIAGNOSIS — Z5111 Encounter for antineoplastic chemotherapy: Secondary | ICD-10-CM | POA: Diagnosis not present

## 2024-11-13 DIAGNOSIS — D649 Anemia, unspecified: Secondary | ICD-10-CM

## 2024-11-13 LAB — SAMPLE TO BLOOD BANK

## 2024-11-13 LAB — CBC WITH DIFFERENTIAL (CANCER CENTER ONLY)
Abs Immature Granulocytes: 0.24 K/uL — ABNORMAL HIGH (ref 0.00–0.07)
Basophils Absolute: 0.1 K/uL (ref 0.0–0.1)
Basophils Relative: 0 %
Eosinophils Absolute: 0 K/uL (ref 0.0–0.5)
Eosinophils Relative: 0 %
HCT: 27.6 % — ABNORMAL LOW (ref 39.0–52.0)
Hemoglobin: 9.2 g/dL — ABNORMAL LOW (ref 13.0–17.0)
Immature Granulocytes: 2 %
Lymphocytes Relative: 19 %
Lymphs Abs: 3.1 K/uL (ref 0.7–4.0)
MCH: 28 pg (ref 26.0–34.0)
MCHC: 33.3 g/dL (ref 30.0–36.0)
MCV: 84.1 fL (ref 80.0–100.0)
Monocytes Absolute: 0.9 K/uL (ref 0.1–1.0)
Monocytes Relative: 6 %
Neutro Abs: 12.1 K/uL — ABNORMAL HIGH (ref 1.7–7.7)
Neutrophils Relative %: 73 %
Platelet Count: 104 K/uL — ABNORMAL LOW (ref 150–400)
RBC: 3.28 MIL/uL — ABNORMAL LOW (ref 4.22–5.81)
RDW: 17.5 % — ABNORMAL HIGH (ref 11.5–15.5)
WBC Count: 16.4 K/uL — ABNORMAL HIGH (ref 4.0–10.5)
nRBC: 0 % (ref 0.0–0.2)

## 2024-11-13 LAB — CMP (CANCER CENTER ONLY)
ALT: 14 U/L (ref 0–44)
AST: 27 U/L (ref 15–41)
Albumin: 3.7 g/dL (ref 3.5–5.0)
Alkaline Phosphatase: 96 U/L (ref 38–126)
Anion gap: 12 (ref 5–15)
BUN: 23 mg/dL — ABNORMAL HIGH (ref 6–20)
CO2: 23 mmol/L (ref 22–32)
Calcium: 9.9 mg/dL (ref 8.9–10.3)
Chloride: 104 mmol/L (ref 98–111)
Creatinine: 1.59 mg/dL — ABNORMAL HIGH (ref 0.61–1.24)
GFR, Estimated: 50 mL/min — ABNORMAL LOW
Glucose, Bld: 110 mg/dL — ABNORMAL HIGH (ref 70–99)
Potassium: 3.3 mmol/L — ABNORMAL LOW (ref 3.5–5.1)
Sodium: 139 mmol/L (ref 135–145)
Total Bilirubin: 0.2 mg/dL (ref 0.0–1.2)
Total Protein: 7.5 g/dL (ref 6.5–8.1)

## 2024-11-16 ENCOUNTER — Telehealth: Payer: Self-pay | Admitting: Internal Medicine

## 2024-11-19 ENCOUNTER — Inpatient Hospital Stay

## 2024-11-19 ENCOUNTER — Inpatient Hospital Stay: Admitting: Nurse Practitioner

## 2024-11-19 ENCOUNTER — Inpatient Hospital Stay: Admitting: Internal Medicine

## 2024-11-19 DIAGNOSIS — C3491 Malignant neoplasm of unspecified part of right bronchus or lung: Secondary | ICD-10-CM

## 2024-11-19 NOTE — Progress Notes (Signed)
 " [facility] [phone]   [phone]  PROGRESS NOTE FOR TELEMEDICINE VISITS  Benjamin Clam, MD [address] [place] [address] [place] [address]  I connected withNAME@ on 11/19/24 at  9:15 AM EST by telephone visit and verified that I am speaking with the correct person using two identifiers.   I discussed the limitations, risks, security and privacy concerns of performing an evaluation and management service by telemedicine and the availability of in-person appointments. I also discussed with the patient that there may be a patient responsible charge related to this service. The patient expressed understanding and agreed to proceed.  Other persons participating in the visit and their role in the encounter:  None  Patient's location:  Home Provider's location:  Home  DIAGNOSIS: Stage IV (T3, N3, M1 C) non-small cell lung cancer, adenocarcinoma presented with large right upper lobe lung mass in addition to right hilar and mediastinal as well as right supraclavicular lymphadenopathy and metastatic bone lesions including the left proximal humerus, L2 as well as sacral area and left adrenal metastasis diagnosed in June 2025.   Molecular studies show PDL1 of 0%   Biomarker Findings Blood Tumor Mutational Burden - 37 Muts/Mb ctDNA Tumor Fraction - High (2.4%) Microsatellite status - MSI-High Not Detected Genomic Findings For a complete list of the genes assayed, please refer to the Appendix. ARID1A E2250* KEAP1 G524C, G469fs*23 CTNNA1 splice site 2192+1G>C DNMT3A R882C TET2 K1197* TP53 splice site 813_[id number], deletion exons 8-9 This assay tested >300 cancer-related genes, including the following 8 gene(s) routinely assessed in this tumor type: ALK, BRAF, EGFR, ERBB2, KRAS, MET, RET, ROS1.     PRIOR THERAPY:   1) Palliative radiation to the painful bone lesion in the left humerus. Last day on 05/03/24.  2) Systemic chemotherapy with  Carboplatin  for an AUC of 5, Alimta  500 mg/m2, and Libatyo IV every 3 weeks. First dose on 05/02/24. Doses of Alimta  reduced to 400 mg/m2 starting from cycle #2 due to HIV and neutropenia.  Status post 7 cycles.  Starting cycle #5 the patient is on treatment with maintenance Alimta  and Libtayo  (Cempilimab) every 3 weeks.  This treatment was discontinued secondary to disease progression.   CURRENT THERAPY: Second line systemic chemotherapy with reduced dose docetaxel  65 mg/M2 and ramucirumab  10 mg/KG every 3 weeks with Neulasta  support.  First dose was given October 29, 2024.  Status post 1 cycle.   INTERVAL HISTORY: Benjamin Herrera 59 y.o. male has a telephone virtual visit with me today. Discussed the use of AI scribe software for clinical note transcription with the patient, who gave verbal consent to proceed.  History of Present Illness Benjamin Herrera is a 59 year old male with non-small cell lung cancer who presents for pre-chemotherapy evaluation prior to cycle two.  He has completed one cycle of docetaxel  65 mg/m and ramucirumab  10 mg/kg, administered every three weeks. He is scheduled to begin cycle two in two days.  Since initiation of chemotherapy, he has experienced alopecia as the only adverse effect.  The patient tolerated his treatment with the first cycle of the treatment with docetaxel  and ramucirumab  fairly well except for the alopecia.  He denied having any significant nausea, vomiting, diarrhea or constipation.  He has no peripheral neuropathy.  He has no significant weight loss.   MEDICAL HISTORY: Past Medical History:  Diagnosis Date   Achalasia and cardiospasm    Allergy    Anxiety    Arthritis    back ,shoulders  Asthma    Boils    under arms hx of   CAD (coronary artery disease)    a. 09/2016: cath showing 100% RCA occlusion with collaterals and 90% OM1 (treated with DES)   Candidiasis of mouth    Candidiasis of unspecified site    Cholelithiasis    Cocaine abuse  (HCC)    Quit 09/2016   Condyloma acuminatum in male of scrotum & anal canal s/p laser ablation 07/03/2012   Coronary artery disease    Dysphagia    Fracture, humerus    Frozen shoulder    left   GERD (gastroesophageal reflux disease)    Heart attack (HCC)    Hemorrhoids    Hepatitis 1993   A   Herpes labialis    HIV (human immunodeficiency virus infection) (HCC) DX 1993   Hypertension Dx 2014   off all bp meds for last 9 or 10 months   Pilonidal disease 01/10/2012   Polysubstance abuse (HCC)    Psoriasis    hx of   Sleep apnea    does  have CPAP   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  MEDICATIONS:  Current Outpatient Medications  Medication Sig Dispense Refill   acetaminophen  (TYLENOL ) 325 MG tablet Take 650 mg by mouth every 6 (six) hours as needed for mild pain (pain score 1-3), moderate pain (pain score 4-6) or headache.     albuterol  (VENTOLIN  HFA) 108 (90 Base) MCG/ACT inhaler Inhale 2 puffs into the lungs every 6 (six) hours as needed for wheezing or shortness of breath. 1 each 2   amLODipine  (NORVASC ) 5 MG tablet Take 1 tablet (5 mg total) by mouth daily. (Patient not taking: Reported on 10/29/2024) 90 tablet 1   amoxicillin -clavulanate (AUGMENTIN ) 875-125 MG tablet Take 1 tablet by mouth 2 (two) times daily. 20 tablet 0   aspirin  81 MG chewable tablet Chew 1 tablet (81 mg total) by mouth daily. 90 tablet 3   Bictegravir-Emtricitab-Tenofov (BIKTARVY ) 30-120-15 MG TABS Take 1 tablet by mouth daily. 90 tablet 3   Blood Pressure KIT 1 kit by Does not apply route daily. 1 kit 0   dexamethasone  (DECADRON ) 4 MG tablet Take TWO tablets TWICE a day the day before chemotherapy, the day of chemotherapy, and the day after chemotherapy 40 tablet 2   diclofenac  Sodium (VOLTAREN  ARTHRITIS PAIN) 1 % GEL Apply 2 g topically 4  (four) times daily. 100 g 3   hydrOXYzine  (ATARAX ) 25 MG tablet Take 1 tablet (25 mg total) by mouth 3 (three) times daily as needed. 30 tablet 0   lidocaine  (XYLOCAINE ) 2 % solution SWISH AND SWALLOW 5 MLS 3 (THREE) TIMES DAILY AS NEEDED FOR MOUTH PAIN. 360 mL 1   LORazepam  (ATIVAN ) 0.5 MG tablet TAKE 1 TAB BY MOUTH 30 MINUTES PRIOR TO RADIATION OR MRI SCANS 4 tablet 0   magic mouthwash (nystatin , diphenhydrAMINE , alum & mag hydroxide) suspension mixture Take 5 mLs by mouth 3 (three) times daily as needed for mouth pain. 210 mL 1   methocarbamol  (ROBAXIN ) 500 MG tablet Take 2 tablets (1,000 mg total) by mouth every 8 (eight) hours as needed. 90 tablet 1   methylPREDNISolone  (MEDROL  DOSEPAK) 4 MG TBPK tablet Use as instructed 21 tablet 0   metoprolol  succinate (TOPROL -XL) 25 MG 24 hr tablet TAKE 1/2 TABLET BY MOUTH  DAILY 45 tablet 0   mirtazapine  (REMERON ) 15 MG tablet TAKE 1 TABLET BY MOUTH EVERYDAY AT BEDTIME 90 tablet 1   nitroGLYCERIN  (NITROSTAT ) 0.4 MG SL tablet Place 1 tablet (0.4 mg total) under the tongue every 5 (five) minutes as needed for chest pain (trouble swallowing). 25 tablet 1   nystatin  (MYCOSTATIN ) 100000 UNIT/ML suspension Take 5 mLs by mouth 4 (four) times daily. 60 mL 0   oxyCODONE -acetaminophen  (PERCOCET/ROXICET) 5-325 MG tablet Take 1 tablet by mouth every 6 (six) hours as needed for severe pain (pain score 7-10). 60 tablet 0   potassium chloride  SA (KLOR-CON  M) 20 MEQ tablet Take 1 tablet (20 mEq total) by mouth daily. 6 tablet 0   predniSONE  (DELTASONE ) 50 MG tablet Take one tablet 13 hours before , one tablet 7 hours before and one tablet one hour prior to CT scan. Take benadryl  50 mg one hour prior to scan 3 tablet 0   prochlorperazine  (COMPAZINE ) 10 MG tablet Take 1 tablet (10 mg total) by mouth every 6 (six) hours as needed. 30 tablet 2   sulfamethoxazole -trimethoprim  (BACTRIM  DS) 800-160 MG tablet Take 1 tablet by mouth 3 (three) times a week. 36 tablet 3   No current  facility-administered medications for this visit.    SURGICAL HISTORY:  Past Surgical History:  Procedure Laterality Date   BALLOON DILATION N/A 11/11/2015   Procedure: BALLOON DILATION;  Surgeon: Gustav Shila GAILS, MD;  Location: [facility];  Service: Endoscopy;  Laterality: N/A;   BOTOX  INJECTION  10/11/2012   Procedure: BOTOX  INJECTION;  Surgeon: Lamar JONETTA Aho, MD;  Location: [facility];  Service: Endoscopy;  Laterality: N/A;   BOTOX  INJECTION N/A 01/25/2013   Procedure: BOTOX  INJECTION;  Surgeon: Lamar JONETTA Aho, MD;  Location: [facility];  Service: Endoscopy;  Laterality: N/A;   BOTOX  INJECTION N/A 06/21/2013   Procedure: BOTOX  INJECTION;  Surgeon: Lamar JONETTA Aho, MD;  Location: [facility];  Service: Endoscopy;  Laterality: N/A;   BOTOX  INJECTION N/A 10/08/2013   Procedure: BOTOX  INJECTION;  Surgeon: Lamar JONETTA Aho, MD;  Location: [facility];  Service: Endoscopy;  Laterality: N/A;   BOTOX  INJECTION N/A 06/16/2015   Procedure: BOTOX  INJECTION;  Surgeon: Lamar JONETTA Aho, MD;  Location: [facility];  Service: Endoscopy;  Laterality: N/A;   BRONCHIAL BIOPSY  04/03/2024   Procedure: BRONCHOSCOPY, WITH BIOPSY;  Surgeon: Shelah Lamar RAMAN, MD;  Location: [facility];  Service: Pulmonary;;   BRONCHIAL BRUSHINGS  04/03/2024   Procedure: BRONCHOSCOPY, WITH BRUSH BIOPSY;  Surgeon: Shelah Lamar RAMAN, MD;  Location: [facility];  Service: Pulmonary;;   BRONCHIAL NEEDLE ASPIRATION BIOPSY  04/03/2024   Procedure: BRONCHOSCOPY, WITH NEEDLE ASPIRATION BIOPSY;  Surgeon: Shelah Lamar RAMAN, MD;  Location: [facility];  Service: Pulmonary;;   BRONCHOSCOPY, WITH BIOPSY USING ELECTROMAGNETIC NAVIGATION Right 04/03/2024   Procedure: BRONCHOSCOPY, WITH BIOPSY USING ELECTROMAGNETIC NAVIGATION;  Surgeon: Shelah Lamar RAMAN, MD;  Location: [facility];  Service: Pulmonary;  Laterality: Right;   CARDIAC CATHETERIZATION N/A 10/11/2016   Procedure: Left Heart Cath and Coronary Angiography;  Surgeon: Lonni JONETTA Cash, MD;  mRCA 100% w/ L>R collaterals, OM1 90%   CARDIAC CATHETERIZATION N/A 10/11/2016   Procedure: Coronary Stent Intervention;  Surgeon: Lonni JONETTA Cash, MD;  RESOLUTE ONYX 3.5X15 DES OM1   COLONOSCOPY  05/19/2012   Procedure: COLONOSCOPY;  Surgeon: Lamar JONETTA Aho, MD;  Location: [facility];  Service: Endoscopy;  Laterality: N/A;  jill trying to contact pt to come in 0830 for 930 case, phone not  accepting messages   COLONOSCOPY     ESOPHAGEAL MANOMETRY  05/29/2012   Procedure: ESOPHAGEAL MANOMETRY (EM);  Surgeon: Lamar JONETTA Aho, MD;  Location: [facility];  Service: Endoscopy;  Laterality: N/A;   ESOPHAGEAL MANOMETRY N/A 10/06/2015   Procedure: ESOPHAGEAL MANOMETRY (EM);  Surgeon: Gustav Shila GAILS, MD;  Location: [facility];  Service: Endoscopy;  Laterality: N/A;   ESOPHAGOGASTRODUODENOSCOPY  11/11/2011   Procedure: ESOPHAGOGASTRODUODENOSCOPY (EGD);  Surgeon: Lamar JONETTA Aho, MD;  Location: [facility];  Service: Endoscopy;  Laterality: N/A;  botox  injection  called Pt to change time of procedure per Dr Aho   ESOPHAGOGASTRODUODENOSCOPY  03/10/2012   Procedure: ESOPHAGOGASTRODUODENOSCOPY (EGD);  Surgeon: Lamar JONETTA Aho, MD;  Location: [facility];  Service: Endoscopy;  Laterality: N/A;   ESOPHAGOGASTRODUODENOSCOPY  10/11/2012   Procedure: ESOPHAGOGASTRODUODENOSCOPY (EGD);  Surgeon: Lamar JONETTA Aho, MD;  Location: [facility];  Service: Endoscopy;  Laterality: N/A;   ESOPHAGOGASTRODUODENOSCOPY N/A 01/25/2013   Procedure: ESOPHAGOGASTRODUODENOSCOPY (EGD);  Surgeon: Lamar JONETTA Aho, MD;  Location: [facility];  Service: Endoscopy;  Laterality: N/A;   ESOPHAGOGASTRODUODENOSCOPY N/A 06/21/2013   Procedure: ESOPHAGOGASTRODUODENOSCOPY (EGD);  Surgeon: Lamar JONETTA Aho, MD;  Location: [facility];  Service: Endoscopy;  Laterality: N/A;   ESOPHAGOGASTRODUODENOSCOPY (EGD) WITH PROPOFOL  N/A 10/08/2013   Procedure: ESOPHAGOGASTRODUODENOSCOPY (EGD) WITH PROPOFOL ;  Surgeon: Lamar JONETTA Aho, MD;  Location: [facility];  Service: Endoscopy;  Laterality: N/A;   ESOPHAGOGASTRODUODENOSCOPY (EGD) WITH PROPOFOL  N/A 06/16/2015   Procedure: ESOPHAGOGASTRODUODENOSCOPY (EGD) WITH PROPOFOL ;  Surgeon: Lamar JONETTA Aho, MD;  Location: [facility];  Service: Endoscopy;  Laterality: N/A;   ESOPHAGOGASTRODUODENOSCOPY (EGD) WITH PROPOFOL  N/A 11/11/2015   Procedure: ESOPHAGOGASTRODUODENOSCOPY (EGD) WITH PROPOFOL ;  Surgeon: Gustav Shila GAILS, MD;  Location: [facility];  Service: Endoscopy;  Laterality: N/A;   EXAMINATION UNDER ANESTHESIA  07/24/2012   Procedure: EXAM UNDER ANESTHESIA;  Surgeon: Elspeth KYM Schultze, MD;  Location: [facility];  Service: General;  Laterality: N/A;   humeral fracture surgery Right yrs ago   IR BONE TUMOR(S)RF ABLATION  10/16/2024   IR KYPHO LUMBAR INC FX REDUCE BONE BX UNI/BIL CANNULATION INC/IMAGING  10/16/2024   IR RADIOLOGIST EVAL & MGMT  09/12/2024   PILONIDAL CYST EXCISION  07/24/2012   Procedure: CYST EXCISION PILONIDAL SIMPLE;  Surgeon: Elspeth KYM Schultze, MD;  Location: [facility];  Service: General;  Laterality: N/A;  Exam Under Anesthesia,, Excision Pilonidal Disease,    right shoulder replacement  2008   x 2   UPPER GASTROINTESTINAL ENDOSCOPY     WART FULGURATION  07/24/2012   Procedure: FULGURATION ANAL WART;  Surgeon: Elspeth KYM Schultze, MD;  Location: [facility];  Service: General;  Laterality: N/A;  excision of raphe mass    REVIEW OF SYSTEMS:  A comprehensive review of systems was negative except for: Integument/breast: positive for alopecia    LABORATORY DATA: Lab Results  Component Value Date   WBC 16.4 (H) 11/13/2024   HGB 9.2 (L) 11/13/2024   HCT 27.6 (L) 11/13/2024   MCV 84.1 11/13/2024   PLT 104 (L) 11/13/2024      Chemistry      Component Value Date/Time   NA 139 11/13/2024 1111   NA 136 06/19/2024 1445   K 3.3 (L) 11/13/2024 1111   CL 104 11/13/2024 1111   CO2 23 11/13/2024 1111   BUN 23 (H) 11/13/2024 1111   BUN 20 06/19/2024 1445    CREATININE 1.59 (H) 11/13/2024 1111   CREATININE 0.96 07/30/2020 1437      Component Value Date/Time  CALCIUM  9.9 11/13/2024 1111   ALKPHOS 96 11/13/2024 1111   AST 27 11/13/2024 1111   ALT 14 11/13/2024 1111   BILITOT 0.2 11/13/2024 1111       RADIOGRAPHIC STUDIES: No results found.  ASSESSMENT AND PLAN: This is a 59 years old African-American male with Stage IV (T3, N3, M1 C) non-small cell lung cancer, adenocarcinoma presented with large right upper lobe lung mass in addition to right hilar and mediastinal as well as right supraclavicular lymphadenopathy and metastatic bone lesions including the left proximal humerus, L2 as well as sacral area and left adrenal metastasis diagnosed in June 2025. Molecular studies showed no actionable mutations and has negative PD-L1 expression but high TMB He underwent systemic chemotherapy with reduced dose carboplatin  for AUC of 4 and pemetrexed  400 Mg/M2 in addition to Libtayo  (Cempilimab) 350 Mg IV every 3 weeks.  Status post 7 cycles.  Starting cycle #5 he is on maintenance treatment with Alimta  and Libtayo  every 3 weeks.  Starting from cycle #5 he has been on maintenance treatment with Alimta  and Libtayo  (Cempilimab) every 3 weeks.  This treatment was discontinued secondary to disease progression and the patient restarted second line systemic chemotherapy with reduced dose docetaxel  65 MGs/M2 and ramucirumab  10 mg/KG every 3 weeks status post 1 cycle.  He tolerated the first cycle of close treatment fairly well except for mild fatigue and lack of taste as well as alopecia. He is also on treatment with Xgeva  for bone metastasis. Assessment and Plan Assessment & Plan Lung cancer He is undergoing systemic chemotherapy with docetaxel  and ramucirumab , having completed one cycle without new symptoms or complications. - Continued docetaxel  65 mg/m and ramucirumab  10 mg/kg every three weeks. - Scheduled cycle two of chemotherapy for November 21, 2024. -  Scheduled follow-up visit three weeks after cycle two for evaluation and management prior to cycle three.  Chemotherapy adverse effects Alopecia is the only reported adverse effect following the first cycle; no other adverse effects present. - Will evaluate and manage any adverse effects at the follow-up visit prior to cycle three. He was advised to call immediately if he has any other concerning symptoms in the interval.    I discussed the assessment and treatment plan with the patient. The patient was provided an opportunity to ask questions and all were answered. The patient agreed with the plan and demonstrated an understanding of the instructions.   The patient was advised to call back or seek an in-person evaluation if the symptoms worsen or if the condition fails to improve as anticipated.  I provided 20 minutes of non face-to-face telephone visit time during this encounter, and > 50% was spent counseling as documented under my assessment & plan.  Benjamin Herrera MARLA Sherrod, MD 11/19/2024 8:33 AM  Disclaimer: This note was dictated with voice recognition software. Similar sounding words can inadvertently be transcribed and may not be corrected upon review.        "

## 2024-11-21 ENCOUNTER — Inpatient Hospital Stay

## 2024-11-21 ENCOUNTER — Encounter: Payer: Self-pay | Admitting: Nurse Practitioner

## 2024-11-21 VITALS — BP 138/76 | HR 93 | Temp 98.0°F | Resp 17 | Ht 73.0 in | Wt 174.0 lb

## 2024-11-21 VITALS — BP 153/96 | HR 88 | Temp 97.9°F | Resp 16

## 2024-11-21 DIAGNOSIS — G893 Neoplasm related pain (acute) (chronic): Secondary | ICD-10-CM | POA: Diagnosis not present

## 2024-11-21 DIAGNOSIS — C3491 Malignant neoplasm of unspecified part of right bronchus or lung: Secondary | ICD-10-CM

## 2024-11-21 DIAGNOSIS — Z87891 Personal history of nicotine dependence: Secondary | ICD-10-CM

## 2024-11-21 DIAGNOSIS — Z515 Encounter for palliative care: Secondary | ICD-10-CM | POA: Diagnosis not present

## 2024-11-21 DIAGNOSIS — R630 Anorexia: Secondary | ICD-10-CM | POA: Diagnosis not present

## 2024-11-21 DIAGNOSIS — D709 Neutropenia, unspecified: Secondary | ICD-10-CM

## 2024-11-21 DIAGNOSIS — Z5111 Encounter for antineoplastic chemotherapy: Secondary | ICD-10-CM | POA: Diagnosis not present

## 2024-11-21 DIAGNOSIS — R530 Neoplastic (malignant) related fatigue: Secondary | ICD-10-CM

## 2024-11-21 DIAGNOSIS — D649 Anemia, unspecified: Secondary | ICD-10-CM

## 2024-11-21 LAB — CMP (CANCER CENTER ONLY)
ALT: 16 U/L (ref 0–44)
AST: 33 U/L (ref 15–41)
Albumin: 3.7 g/dL (ref 3.5–5.0)
Alkaline Phosphatase: 91 U/L (ref 38–126)
Anion gap: 14 (ref 5–15)
BUN: 17 mg/dL (ref 6–20)
CO2: 20 mmol/L — ABNORMAL LOW (ref 22–32)
Calcium: 8.4 mg/dL — ABNORMAL LOW (ref 8.9–10.3)
Chloride: 102 mmol/L (ref 98–111)
Creatinine: 1.52 mg/dL — ABNORMAL HIGH (ref 0.61–1.24)
GFR, Estimated: 53 mL/min — ABNORMAL LOW
Glucose, Bld: 136 mg/dL — ABNORMAL HIGH (ref 70–99)
Potassium: 3.4 mmol/L — ABNORMAL LOW (ref 3.5–5.1)
Sodium: 136 mmol/L (ref 135–145)
Total Bilirubin: 0.3 mg/dL (ref 0.0–1.2)
Total Protein: 7.7 g/dL (ref 6.5–8.1)

## 2024-11-21 LAB — CBC WITH DIFFERENTIAL (CANCER CENTER ONLY)
Abs Immature Granulocytes: 0.13 10*3/uL — ABNORMAL HIGH (ref 0.00–0.07)
Basophils Absolute: 0 10*3/uL (ref 0.0–0.1)
Basophils Relative: 0 %
Eosinophils Absolute: 0 10*3/uL (ref 0.0–0.5)
Eosinophils Relative: 0 %
HCT: 30.7 % — ABNORMAL LOW (ref 39.0–52.0)
Hemoglobin: 10 g/dL — ABNORMAL LOW (ref 13.0–17.0)
Immature Granulocytes: 1 %
Lymphocytes Relative: 16 %
Lymphs Abs: 2.4 10*3/uL (ref 0.7–4.0)
MCH: 27.2 pg (ref 26.0–34.0)
MCHC: 32.6 g/dL (ref 30.0–36.0)
MCV: 83.4 fL (ref 80.0–100.0)
Monocytes Absolute: 0.4 10*3/uL (ref 0.1–1.0)
Monocytes Relative: 3 %
Neutro Abs: 11.9 10*3/uL — ABNORMAL HIGH (ref 1.7–7.7)
Neutrophils Relative %: 80 %
Platelet Count: 124 10*3/uL — ABNORMAL LOW (ref 150–400)
RBC: 3.68 MIL/uL — ABNORMAL LOW (ref 4.22–5.81)
RDW: 18.1 % — ABNORMAL HIGH (ref 11.5–15.5)
WBC Count: 14.8 10*3/uL — ABNORMAL HIGH (ref 4.0–10.5)
nRBC: 0 % (ref 0.0–0.2)

## 2024-11-21 LAB — SAMPLE TO BLOOD BANK

## 2024-11-21 MED ORDER — DENOSUMAB 120 MG/1.7ML ~~LOC~~ SOLN
120.0000 mg | Freq: Once | SUBCUTANEOUS | Status: AC
  Administered 2024-11-21: 120 mg via SUBCUTANEOUS
  Filled 2024-11-21: qty 1.7

## 2024-11-21 MED ORDER — OXYCODONE-ACETAMINOPHEN 5-325 MG PO TABS: 1.0000 | ORAL_TABLET | Freq: Four times a day (QID) | ORAL | 0 refills | Status: AC | PRN

## 2024-11-21 MED ORDER — ACETAMINOPHEN 325 MG PO TABS
650.0000 mg | ORAL_TABLET | Freq: Once | ORAL | Status: AC
  Administered 2024-11-21: 650 mg via ORAL
  Filled 2024-11-21: qty 2

## 2024-11-21 MED ORDER — SODIUM CHLORIDE 0.9 % IV SOLN
10.0000 mg/kg | Freq: Once | INTRAVENOUS | Status: AC
  Administered 2024-11-21: 800 mg via INTRAVENOUS
  Filled 2024-11-21: qty 30

## 2024-11-21 MED ORDER — DIPHENHYDRAMINE HCL 50 MG/ML IJ SOLN
50.0000 mg | Freq: Once | INTRAMUSCULAR | Status: AC
  Administered 2024-11-21: 50 mg via INTRAVENOUS
  Filled 2024-11-21: qty 1

## 2024-11-21 MED ORDER — SODIUM CHLORIDE 0.9 % IV SOLN
65.0000 mg/m2 | Freq: Once | INTRAVENOUS | Status: AC
  Administered 2024-11-21: 131 mg via INTRAVENOUS
  Filled 2024-11-21: qty 13.1

## 2024-11-21 MED ORDER — DEXAMETHASONE SOD PHOSPHATE PF 10 MG/ML IJ SOLN
10.0000 mg | Freq: Once | INTRAMUSCULAR | Status: AC
  Administered 2024-11-21: 10 mg via INTRAVENOUS
  Filled 2024-11-21: qty 1

## 2024-11-21 MED ORDER — SODIUM CHLORIDE 0.9 % IV SOLN: INTRAVENOUS | Status: DC

## 2024-11-21 NOTE — Progress Notes (Signed)
 "    Palliative Medicine [facility]  [phone] [phone]   Name: Benjamin Herrera Date: 11/21/2024 MRN: [id number]  DOB: [birth year]  Patient Care Team: [name], MD as PCP - General (Family Medicine) Eben Reyes [name], MD as PCP - Infectious Diseases (Infectious Diseases) Mona Vinie [name], MD as PCP - Cardiology (Cardiology) Debrah Lamar [name], MD (Inactive) as Consulting Physician (Gastroenterology) [name], MD as Consulting Physician (General Surgery) [name], RN as Oncology Nurse Navigator    INTERVAL HISTORY: Benjamin Herrera is a 59 y.o. male with medical problems include: HIV, hypertension, coronary artery disease (hx MI, status post DES to OM1 in 2017), chronic low back pain, SCC of skin, OSA, polysubstance abuse, GERD, asthma, anxiety, arthritis, more recently he was diagnosed with Adenocarcinoma of right lung, stage 4.   SOCIAL HISTORY:     reports that he has quit smoking. His smoking use included cigarettes. He started smoking about 43 years ago. He has a 43.1 pack-year smoking history. He has never used smokeless tobacco. He reports that he does not currently use drugs after having used the following drugs: Marijuana. Frequency: 3.00 times per week. He reports that he does not drink alcohol.  ADVANCE DIRECTIVES:  Full code full scope. Plans to meet with social work to establish advance directives. Referral to social work for this placed at initial visit. Pending completion of forms.   CODE STATUS: Full code  PAST MEDICAL HISTORY: Past Medical History:  Diagnosis Date   Achalasia and cardiospasm    Allergy    Anxiety    Arthritis    back ,shoulders    Asthma    Boils    under arms hx of   CAD (coronary artery disease)    a. 09/2016: cath showing 100% RCA occlusion with collaterals and 90% OM1 (treated with DES)   Candidiasis of mouth    Candidiasis of unspecified site    Cholelithiasis    Cocaine abuse  (HCC)    Quit 09/2016   Condyloma acuminatum in male of scrotum & anal canal s/p laser ablation 07/03/2012   Coronary artery disease    Dysphagia    Fracture, humerus    Frozen shoulder    left   GERD (gastroesophageal reflux disease)    Heart attack (HCC)    Hemorrhoids    Hepatitis 1993   A   Herpes labialis    HIV (human immunodeficiency virus infection) (HCC) DX 1993   Hypertension Dx 2014   off all bp meds for last 9 or 10 months   Pilonidal disease 01/10/2012   Polysubstance abuse (HCC)    Psoriasis    hx of   Sleep apnea    does  have CPAP   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  MEDICATIONS:  Current Outpatient Medications  Medication Sig Dispense Refill   acetaminophen  (TYLENOL ) 325 MG tablet Take 650 mg by mouth every 6 (six) hours as needed for mild pain (pain score 1-3), moderate pain (pain score 4-6) or headache.     albuterol  (VENTOLIN  HFA) 108 (90 Base) MCG/ACT inhaler Inhale 2 puffs into the lungs every 6 (six) hours as needed for wheezing or shortness of breath. 1 each 2   amLODipine  (NORVASC ) 5 MG tablet Take 1 tablet (5 mg total) by mouth daily. (  Patient not taking: Reported on 10/29/2024) 90 tablet 1   amoxicillin -clavulanate (AUGMENTIN ) 875-125 MG tablet Take 1 tablet by mouth 2 (two) times daily. 20 tablet 0   aspirin  81 MG chewable tablet Chew 1 tablet (81 mg total) by mouth daily. 90 tablet 3   Bictegravir-Emtricitab-Tenofov (BIKTARVY ) 30-120-15 MG TABS Take 1 tablet by mouth daily. 90 tablet 3   Blood Pressure KIT 1 kit by Does not apply route daily. 1 kit 0   dexamethasone  (DECADRON ) 4 MG tablet Take TWO tablets TWICE a day the day before chemotherapy, the day of chemotherapy, and the day after chemotherapy 40 tablet 2   diclofenac  Sodium (VOLTAREN  ARTHRITIS PAIN) 1 % GEL Apply 2 g topically 4  (four) times daily. 100 g 3   hydrOXYzine  (ATARAX ) 25 MG tablet Take 1 tablet (25 mg total) by mouth 3 (three) times daily as needed. 30 tablet 0   lidocaine  (XYLOCAINE ) 2 % solution SWISH AND SWALLOW 5 MLS 3 (THREE) TIMES DAILY AS NEEDED FOR MOUTH PAIN. 360 mL 1   LORazepam  (ATIVAN ) 0.5 MG tablet TAKE 1 TAB BY MOUTH 30 MINUTES PRIOR TO RADIATION OR MRI SCANS 4 tablet 0   magic mouthwash (nystatin , diphenhydrAMINE , alum & mag hydroxide) suspension mixture Take 5 mLs by mouth 3 (three) times daily as needed for mouth pain. 210 mL 1   methocarbamol  (ROBAXIN ) 500 MG tablet Take 2 tablets (1,000 mg total) by mouth every 8 (eight) hours as needed. 90 tablet 1   methylPREDNISolone  (MEDROL  DOSEPAK) 4 MG TBPK tablet Use as instructed 21 tablet 0   metoprolol  succinate (TOPROL -XL) 25 MG 24 hr tablet TAKE 1/2 TABLET BY MOUTH DAILY 45 tablet 0   mirtazapine  (REMERON ) 15 MG tablet TAKE 1 TABLET BY MOUTH EVERYDAY AT BEDTIME 90 tablet 1   nitroGLYCERIN  (NITROSTAT ) 0.4 MG SL tablet Place 1 tablet (0.4 mg total) under the tongue every 5 (five) minutes as needed for chest pain (trouble swallowing). 25 tablet 1   nystatin  (MYCOSTATIN ) 100000 UNIT/ML suspension Take 5 mLs by mouth 4 (four) times daily. 60 mL 0   oxyCODONE -acetaminophen  (PERCOCET/ROXICET) 5-325 MG tablet Take 1 tablet by mouth every 6 (six) hours as needed for severe pain (pain score 7-10). 60 tablet 0   potassium chloride  SA (KLOR-CON  M) 20 MEQ tablet Take 1 tablet (20 mEq total) by mouth daily. 6 tablet 0   predniSONE  (DELTASONE ) 50 MG tablet Take one tablet 13 hours before , one tablet 7 hours before and one tablet one hour prior to CT scan. Take benadryl  50 mg one hour prior to scan 3 tablet 0   prochlorperazine  (COMPAZINE ) 10 MG tablet Take 1 tablet (10 mg total) by mouth every 6 (six) hours as needed. 30 tablet 2   sulfamethoxazole -trimethoprim  (BACTRIM  DS) 800-160 MG tablet Take 1 tablet by mouth 3 (three) times a week. 36 tablet 3   No current  facility-administered medications for this visit.    VITAL SIGNS: BP 138/76   Pulse 93   Temp 98 F (36.7 C) (Temporal)   Resp 17   Ht 6' 1 (1.854 m)   Wt 174 lb (78.9 kg)   SpO2 100%   BMI 22.96 kg/m  Filed Weights   11/21/24 0852  Weight: 174 lb (78.9 kg)    Estimated body mass index is 22.96 kg/m as calculated from the following:   Height as of this encounter: 6' 1 (1.854 m).   Weight as of this encounter: 174 lb (78.9 kg).  PERFORMANCE STATUS (ECOG) :  1 - Symptomatic but completely ambulatory  Physical Exam Constitutional:      Appearance: Normal appearance.  Cardiovascular:     Rate and Rhythm: Normal rate and regular rhythm.  Pulmonary:     Effort: Pulmonary effort is normal.  Neurological:     Mental Status: He is alert and oriented to person, place, and time.    IMPRESSION: Discussed the use of AI scribe software for clinical note transcription with the patient, who gave verbal consent to proceed.  History of Present Illness Benjamin Herrera is a 59 year old male with stage IV non-small cell lung cancer who presents who presents to clinic for symptom management follow-up. No acute distress.   He uses Medicaid transportation for his medical appointments and did not encounter any issues getting to the clinic today in winter weather. He shares a positive experience of seeing monks in his neighborhood, which he found uplifting.  He experiences occasional nausea, which is relieved by medication. His appetite has been inconsistent, but prednisone  prescribed last week has improved it. He reports his weight increased from 161 pounds on January 13th to 174 pounds currently. Despite the improvement in appetite, his taste buds are still affected by the treatment, making it difficult to enjoy food. He has lemon candy on hand to support his metallic taste. Encouraged to such on candies at least 30-45 min prior to meals.   He is currently taking oxycodone , one tablet every six  hours as needed for pain management. Reports pain is effectively managed on current regimen. No adjustments at this time.   His bowel movements are irregular, with episodes of constipation and diarrhea, but not severe enough to be problematic. He has resumed taking mirtazapine  15 mg at bedtime, which he had temporarily stopped while on prednisone , to help with anxiety and appetite. Has not noticed much improvement. Encouraged to continue taking allowing time for medication to work.   Assessment & Plan Stage 4 adenocarcinoma of the right lung Stage 4 adenocarcinoma of the right lung. No new side effects reported from treatment, except for occasional nausea managed with medication. Hair loss noted, expected to regrow post-treatment.  Neoplasm related pain Pain management ongoing with oxycodone . Well controlled. Tolerating without difficulty. No significant issues with constipation or diarrhea reported, though bowel movements are variable. - Refilled oxycodone  prescription, 1 tablet every 6 hours as needed  Cancer-related anorexia and weight loss Cancer-related anorexia with fluctuating appetite. Recent weight gain from 161 lbs to 174 lbs since January 13th. Appetite improved with prednisone  and mirtazapine . Taste changes noted, likely treatment-related. Lemon candies used to stimulate appetite. - Continue mirtazapine  15 mg at bedtime - Encouraged use of lemon candies to stimulate appetite  Palliative care management Focus on symptom management and quality of life. Transportation assistance provided through [employer].  Goals of Care Patient realistic in his understanding of current cancer state. States his wishes are to continue to treat the treatable allow him every opportunity to continue to live for as long as he can as he is not ready to give up at this time.  He is open to pursuing treatments with understanding they are what palliative life-prolonging intent. - Will continue to closely monitor  and support offered emotional comfort. - Ongoing goals of care discussions. -Will connect patient with social work support.   I will plan to see patient back in 3-4 weeks. Sooner if needed.   Patient expressed understanding and was in agreement with this plan. He also understands that He can  call the clinic at any time with any questions, concerns, or complaints.   Any controlled substances utilized were prescribed in the context of palliative care. PDMP has been reviewed.   I personally spent a total of 30 minutes in the care of the patient today including preparing to see the patient, getting/reviewing separately obtained history, performing a medically appropriate exam/evaluation, counseling and educating, placing orders, and documenting clinical information in the EHR. Visit consisted of counseling and education dealing with the complex and emotionally intense issues of symptom management and palliative care in the setting of serious and potentially life-threatening illness.  Levon Borer, AGPCNP-BC  Palliative Medicine Team/[facility]    "

## 2024-11-21 NOTE — Progress Notes (Signed)
 Calcium  corrects to 8.6. Okay to proceed with Xgeva  today. RN to inform patient to start taking calcium  supplement at home.  Harlene Nasuti, PharmD Oncology Infusion Pharmacist 11/21/2024 11:35 AM

## 2024-11-23 ENCOUNTER — Inpatient Hospital Stay

## 2024-11-23 VITALS — BP 146/79 | HR 82 | Resp 16

## 2024-11-23 DIAGNOSIS — Z5111 Encounter for antineoplastic chemotherapy: Secondary | ICD-10-CM | POA: Diagnosis not present

## 2024-11-23 DIAGNOSIS — C3491 Malignant neoplasm of unspecified part of right bronchus or lung: Secondary | ICD-10-CM

## 2024-11-23 MED ORDER — PEGFILGRASTIM-CBQV 6 MG/0.6ML ~~LOC~~ SOSY
6.0000 mg | PREFILLED_SYRINGE | Freq: Once | SUBCUTANEOUS | Status: AC
  Administered 2024-11-23: 6 mg via SUBCUTANEOUS
  Filled 2024-11-23: qty 0.6

## 2024-11-27 ENCOUNTER — Inpatient Hospital Stay: Attending: Physician Assistant

## 2024-11-27 ENCOUNTER — Other Ambulatory Visit: Payer: Self-pay | Admitting: Physician Assistant

## 2024-11-27 ENCOUNTER — Telehealth: Payer: Self-pay

## 2024-11-27 DIAGNOSIS — E876 Hypokalemia: Secondary | ICD-10-CM

## 2024-11-27 DIAGNOSIS — C3491 Malignant neoplasm of unspecified part of right bronchus or lung: Secondary | ICD-10-CM

## 2024-11-27 DIAGNOSIS — D649 Anemia, unspecified: Secondary | ICD-10-CM

## 2024-11-27 DIAGNOSIS — B370 Candidal stomatitis: Secondary | ICD-10-CM

## 2024-11-27 LAB — CMP (CANCER CENTER ONLY)
ALT: 27 U/L (ref 0–44)
AST: 38 U/L (ref 15–41)
Albumin: 3.7 g/dL (ref 3.5–5.0)
Alkaline Phosphatase: 93 U/L (ref 38–126)
Anion gap: 12 (ref 5–15)
BUN: 10 mg/dL (ref 6–20)
CO2: 24 mmol/L (ref 22–32)
Calcium: 8 mg/dL — ABNORMAL LOW (ref 8.9–10.3)
Chloride: 102 mmol/L (ref 98–111)
Creatinine: 1.35 mg/dL — ABNORMAL HIGH (ref 0.61–1.24)
GFR, Estimated: >60 mL/min
Glucose, Bld: 96 mg/dL (ref 70–99)
Potassium: 3.2 mmol/L — ABNORMAL LOW (ref 3.5–5.1)
Sodium: 138 mmol/L (ref 135–145)
Total Bilirubin: 0.7 mg/dL (ref 0.0–1.2)
Total Protein: 7.6 g/dL (ref 6.5–8.1)

## 2024-11-27 LAB — SAMPLE TO BLOOD BANK

## 2024-11-27 LAB — CBC WITH DIFFERENTIAL (CANCER CENTER ONLY)
Abs Immature Granulocytes: 0.01 10*3/uL (ref 0.00–0.07)
Basophils Absolute: 0 10*3/uL (ref 0.0–0.1)
Basophils Relative: 1 %
Eosinophils Absolute: 0 10*3/uL (ref 0.0–0.5)
Eosinophils Relative: 2 %
HCT: 30.3 % — ABNORMAL LOW (ref 39.0–52.0)
Hemoglobin: 10 g/dL — ABNORMAL LOW (ref 13.0–17.0)
Immature Granulocytes: 1 %
Lymphocytes Relative: 46 %
Lymphs Abs: 0.8 10*3/uL (ref 0.7–4.0)
MCH: 27.3 pg (ref 26.0–34.0)
MCHC: 33 g/dL (ref 30.0–36.0)
MCV: 82.8 fL (ref 80.0–100.0)
Monocytes Absolute: 0.2 10*3/uL (ref 0.1–1.0)
Monocytes Relative: 12 %
Neutro Abs: 0.6 10*3/uL — ABNORMAL LOW (ref 1.7–7.7)
Neutrophils Relative %: 38 %
Platelet Count: 68 10*3/uL — ABNORMAL LOW (ref 150–400)
RBC: 3.66 MIL/uL — ABNORMAL LOW (ref 4.22–5.81)
RDW: 17.8 % — ABNORMAL HIGH (ref 11.5–15.5)
Smear Review: NORMAL
WBC Count: 1.7 10*3/uL — ABNORMAL LOW (ref 4.0–10.5)
nRBC: 0 % (ref 0.0–0.2)

## 2024-11-27 MED ORDER — FLUCONAZOLE 100 MG PO TABS: 100.0000 mg | ORAL_TABLET | Freq: Every day | ORAL | 0 refills | Status: AC

## 2024-11-27 MED ORDER — POTASSIUM CHLORIDE CRYS ER 20 MEQ PO TBCR: 20.0000 meq | EXTENDED_RELEASE_TABLET | Freq: Every day | ORAL | 0 refills | Status: AC

## 2024-11-27 NOTE — Telephone Encounter (Signed)
 Pt states he has white bumps in mouth.  Painful. Diflucan  sent to pharmacy per [place], [place].  Pt informed and voiced understanding.

## 2024-12-04 ENCOUNTER — Inpatient Hospital Stay

## 2024-12-10 ENCOUNTER — Inpatient Hospital Stay

## 2024-12-10 ENCOUNTER — Inpatient Hospital Stay: Admitting: Internal Medicine

## 2024-12-11 ENCOUNTER — Inpatient Hospital Stay

## 2024-12-11 ENCOUNTER — Inpatient Hospital Stay: Admitting: Dietician

## 2024-12-12 ENCOUNTER — Inpatient Hospital Stay

## 2024-12-13 ENCOUNTER — Inpatient Hospital Stay

## 2024-12-18 ENCOUNTER — Inpatient Hospital Stay

## 2024-12-18 ENCOUNTER — Ambulatory Visit (HOSPITAL_COMMUNITY)

## 2024-12-24 ENCOUNTER — Inpatient Hospital Stay: Attending: Physician Assistant

## 2024-12-24 ENCOUNTER — Ambulatory Visit: Admitting: Radiation Oncology

## 2024-12-25 ENCOUNTER — Inpatient Hospital Stay

## 2024-12-31 ENCOUNTER — Inpatient Hospital Stay

## 2024-12-31 ENCOUNTER — Inpatient Hospital Stay: Admitting: Physician Assistant

## 2025-01-01 ENCOUNTER — Inpatient Hospital Stay: Admitting: Internal Medicine

## 2025-01-01 ENCOUNTER — Inpatient Hospital Stay

## 2025-01-02 ENCOUNTER — Inpatient Hospital Stay

## 2025-01-03 ENCOUNTER — Inpatient Hospital Stay

## 2025-01-08 ENCOUNTER — Inpatient Hospital Stay

## 2025-01-15 ENCOUNTER — Inpatient Hospital Stay

## 2025-01-21 ENCOUNTER — Inpatient Hospital Stay

## 2025-01-21 ENCOUNTER — Inpatient Hospital Stay: Admitting: Internal Medicine

## 2025-01-23 ENCOUNTER — Inpatient Hospital Stay: Attending: Physician Assistant

## 2025-01-29 ENCOUNTER — Inpatient Hospital Stay

## 2025-02-11 ENCOUNTER — Inpatient Hospital Stay: Admitting: Physician Assistant

## 2025-02-11 ENCOUNTER — Inpatient Hospital Stay

## 2025-02-13 ENCOUNTER — Inpatient Hospital Stay
